# Patient Record
Sex: Male | Born: 1945
Health system: Southern US, Community
[De-identification: ages and names within clinical notes are randomized; demographics above are authoritative.]

## PROBLEM LIST (undated history)

## (undated) DIAGNOSIS — I6529 Occlusion and stenosis of unspecified carotid artery: Secondary | ICD-10-CM

## (undated) DIAGNOSIS — K922 Gastrointestinal hemorrhage, unspecified: Secondary | ICD-10-CM

## (undated) DIAGNOSIS — M1990 Unspecified osteoarthritis, unspecified site: Secondary | ICD-10-CM

## (undated) DIAGNOSIS — D649 Anemia, unspecified: Secondary | ICD-10-CM

## (undated) DIAGNOSIS — G4730 Sleep apnea, unspecified: Secondary | ICD-10-CM

## (undated) DIAGNOSIS — Z720 Tobacco use: Secondary | ICD-10-CM

## (undated) DIAGNOSIS — N189 Chronic kidney disease, unspecified: Secondary | ICD-10-CM

## (undated) DIAGNOSIS — R161 Splenomegaly, not elsewhere classified: Secondary | ICD-10-CM

## (undated) DIAGNOSIS — I10 Essential (primary) hypertension: Secondary | ICD-10-CM

## (undated) DIAGNOSIS — I639 Cerebral infarction, unspecified: Secondary | ICD-10-CM

## (undated) DIAGNOSIS — I340 Nonrheumatic mitral (valve) insufficiency: Secondary | ICD-10-CM

## (undated) DIAGNOSIS — I480 Paroxysmal atrial fibrillation: Secondary | ICD-10-CM

## (undated) DIAGNOSIS — Z95810 Presence of automatic (implantable) cardiac defibrillator: Secondary | ICD-10-CM

## (undated) DIAGNOSIS — I219 Acute myocardial infarction, unspecified: Secondary | ICD-10-CM

## (undated) DIAGNOSIS — Z951 Presence of aortocoronary bypass graft: Secondary | ICD-10-CM

## (undated) DIAGNOSIS — I255 Ischemic cardiomyopathy: Secondary | ICD-10-CM

## (undated) DIAGNOSIS — I2510 Atherosclerotic heart disease of native coronary artery without angina pectoris: Secondary | ICD-10-CM

## (undated) DIAGNOSIS — R001 Bradycardia, unspecified: Secondary | ICD-10-CM

## (undated) DIAGNOSIS — I5022 Chronic systolic (congestive) heart failure: Secondary | ICD-10-CM

## (undated) DIAGNOSIS — G709 Myoneural disorder, unspecified: Secondary | ICD-10-CM

## (undated) HISTORY — DX: Anemia, unspecified: D64.9

## (undated) HISTORY — DX: Presence of aortocoronary bypass graft: Z95.1

## (undated) HISTORY — PX: CARDIAC DEFIBRILLATOR PLACEMENT: SHX171

## (undated) HISTORY — PX: CORONARY ARTERY BYPASS GRAFT: SHX141

## (undated) HISTORY — DX: Chronic kidney disease, unspecified: N18.9

## (undated) HISTORY — DX: Nonrheumatic mitral (valve) insufficiency: I34.0

## (undated) HISTORY — DX: Bradycardia, unspecified: R00.1

## (undated) HISTORY — DX: Chronic systolic (congestive) heart failure: I50.22

## (undated) HISTORY — DX: Ischemic cardiomyopathy: I25.5

## (undated) HISTORY — DX: Unspecified osteoarthritis, unspecified site: M19.90

## (undated) HISTORY — DX: Atherosclerotic heart disease of native coronary artery without angina pectoris: I25.10

## (undated) HISTORY — DX: Cerebral infarction, unspecified: I63.9

## (undated) HISTORY — DX: Essential (primary) hypertension: I10

## (undated) HISTORY — DX: Tobacco use: Z72.0

## (undated) HISTORY — DX: Occlusion and stenosis of unspecified carotid artery: I65.29

## (undated) HISTORY — PX: TONSILLECTOMY: SUR1361

---

## 1989-05-30 HISTORY — PX: JOINT REPLACEMENT: SHX530

## 1998-11-28 ENCOUNTER — Encounter
Admission: RE | Admit: 1998-11-28 | Discharge: 1998-12-28 | Payer: Self-pay | Admitting: Physical Medicine and Rehabilitation

## 1999-01-14 ENCOUNTER — Encounter
Admission: RE | Admit: 1999-01-14 | Discharge: 1999-04-14 | Payer: Self-pay | Admitting: Physical Medicine and Rehabilitation

## 2003-10-13 ENCOUNTER — Ambulatory Visit (HOSPITAL_COMMUNITY): Admission: RE | Admit: 2003-10-13 | Discharge: 2003-10-13 | Payer: Self-pay | Admitting: Neurology

## 2005-01-08 ENCOUNTER — Ambulatory Visit: Payer: Self-pay | Admitting: Cardiology

## 2005-01-08 ENCOUNTER — Encounter: Payer: Self-pay | Admitting: Emergency Medicine

## 2005-01-08 ENCOUNTER — Inpatient Hospital Stay (HOSPITAL_COMMUNITY): Admission: EM | Admit: 2005-01-08 | Discharge: 2005-01-14 | Payer: Self-pay | Admitting: Cardiology

## 2005-01-10 HISTORY — PX: OTHER SURGICAL HISTORY: SHX169

## 2005-01-29 ENCOUNTER — Ambulatory Visit: Payer: Self-pay | Admitting: *Deleted

## 2005-04-06 ENCOUNTER — Ambulatory Visit: Payer: Self-pay | Admitting: Cardiology

## 2005-04-06 ENCOUNTER — Inpatient Hospital Stay (HOSPITAL_COMMUNITY): Admission: EM | Admit: 2005-04-06 | Discharge: 2005-04-08 | Payer: Self-pay | Admitting: Emergency Medicine

## 2005-04-30 ENCOUNTER — Ambulatory Visit: Payer: Self-pay | Admitting: Cardiology

## 2005-08-29 ENCOUNTER — Ambulatory Visit: Payer: Self-pay | Admitting: Cardiology

## 2005-09-25 ENCOUNTER — Ambulatory Visit: Payer: Self-pay

## 2005-10-13 ENCOUNTER — Ambulatory Visit: Payer: Self-pay | Admitting: Cardiology

## 2006-02-02 ENCOUNTER — Ambulatory Visit: Payer: Self-pay | Admitting: Cardiology

## 2006-08-17 ENCOUNTER — Ambulatory Visit: Payer: Self-pay

## 2006-09-11 ENCOUNTER — Ambulatory Visit: Payer: Self-pay | Admitting: Cardiology

## 2007-07-31 HISTORY — PX: ESOPHAGOGASTRODUODENOSCOPY: SHX1529

## 2007-07-31 HISTORY — PX: COLONOSCOPY: SHX174

## 2007-08-11 ENCOUNTER — Inpatient Hospital Stay (HOSPITAL_COMMUNITY): Admission: EM | Admit: 2007-08-11 | Discharge: 2007-08-14 | Payer: Self-pay | Admitting: Emergency Medicine

## 2007-08-12 ENCOUNTER — Ambulatory Visit: Payer: Self-pay | Admitting: Internal Medicine

## 2007-08-13 ENCOUNTER — Ambulatory Visit: Payer: Self-pay | Admitting: Internal Medicine

## 2007-08-13 ENCOUNTER — Encounter: Payer: Self-pay | Admitting: Internal Medicine

## 2007-08-14 ENCOUNTER — Ambulatory Visit: Payer: Self-pay | Admitting: Pulmonary Disease

## 2007-08-14 ENCOUNTER — Ambulatory Visit: Payer: Self-pay | Admitting: Oncology

## 2007-08-14 ENCOUNTER — Ambulatory Visit: Payer: Self-pay | Admitting: Cardiology

## 2007-08-14 ENCOUNTER — Ambulatory Visit: Payer: Self-pay | Admitting: *Deleted

## 2007-08-14 ENCOUNTER — Ambulatory Visit: Payer: Self-pay | Admitting: Thoracic Surgery (Cardiothoracic Vascular Surgery)

## 2007-08-14 ENCOUNTER — Inpatient Hospital Stay (HOSPITAL_COMMUNITY): Admission: EM | Admit: 2007-08-14 | Discharge: 2007-09-04 | Payer: Self-pay | Admitting: Emergency Medicine

## 2007-08-15 ENCOUNTER — Encounter: Payer: Self-pay | Admitting: Cardiology

## 2007-08-19 ENCOUNTER — Ambulatory Visit: Payer: Self-pay | Admitting: Gastroenterology

## 2007-08-20 ENCOUNTER — Encounter: Payer: Self-pay | Admitting: Thoracic Surgery (Cardiothoracic Vascular Surgery)

## 2007-08-20 ENCOUNTER — Ambulatory Visit: Payer: Self-pay | Admitting: *Deleted

## 2007-08-20 ENCOUNTER — Encounter: Payer: Self-pay | Admitting: Cardiology

## 2007-08-24 ENCOUNTER — Encounter: Payer: Self-pay | Admitting: Cardiology

## 2007-08-27 ENCOUNTER — Ambulatory Visit: Payer: Self-pay | Admitting: Oncology

## 2007-08-29 ENCOUNTER — Encounter: Payer: Self-pay | Admitting: Thoracic Surgery (Cardiothoracic Vascular Surgery)

## 2007-08-29 HISTORY — PX: CORONARY ARTERY BYPASS GRAFT: SHX141

## 2007-09-15 ENCOUNTER — Ambulatory Visit: Payer: Self-pay | Admitting: Cardiology

## 2007-09-15 LAB — CONVERTED CEMR LAB
ALT: 73 units/L — ABNORMAL HIGH (ref 0–53)
AST: 28 units/L (ref 0–37)
Bilirubin, Direct: 0.5 mg/dL — ABNORMAL HIGH (ref 0.0–0.3)
CO2: 31 meq/L (ref 19–32)
Calcium: 9.2 mg/dL (ref 8.4–10.5)
Chloride: 103 meq/L (ref 96–112)
GFR calc non Af Amer: 73 mL/min
Glucose, Bld: 142 mg/dL — ABNORMAL HIGH (ref 70–99)

## 2007-09-21 ENCOUNTER — Ambulatory Visit: Payer: Self-pay | Admitting: Cardiology

## 2007-09-21 LAB — CONVERTED CEMR LAB
Alkaline Phosphatase: 298 units/L — ABNORMAL HIGH (ref 39–117)
Total Bilirubin: 1.2 mg/dL (ref 0.3–1.2)
Total Protein: 6.7 g/dL (ref 6.0–8.3)

## 2007-09-27 ENCOUNTER — Encounter
Admission: RE | Admit: 2007-09-27 | Discharge: 2007-09-27 | Payer: Self-pay | Admitting: Thoracic Surgery (Cardiothoracic Vascular Surgery)

## 2007-09-27 ENCOUNTER — Ambulatory Visit: Payer: Self-pay | Admitting: Thoracic Surgery (Cardiothoracic Vascular Surgery)

## 2007-10-01 LAB — MORPHOLOGY: PLT EST: ADEQUATE

## 2007-10-01 LAB — CBC & DIFF AND RETIC
BASO%: 0.9 % (ref 0.0–2.0)
Eosinophils Absolute: 0.2 10*3/uL (ref 0.0–0.5)
HCT: 39 % (ref 38.7–49.9)
IRF: 0.33 (ref 0.070–0.380)
MCHC: 33.1 g/dL (ref 32.0–35.9)
MONO#: 0.5 10*3/uL (ref 0.1–0.9)
NEUT#: 5.1 10*3/uL (ref 1.5–6.5)
NEUT%: 64 % (ref 40.0–75.0)
RBC: 4.75 10*6/uL (ref 4.20–5.71)
Retic %: 1.5 % (ref 0.7–2.3)
WBC: 7.9 10*3/uL (ref 4.0–10.0)
lymph#: 2.1 10*3/uL (ref 0.9–3.3)

## 2007-10-01 LAB — CHCC SMEAR

## 2007-10-05 LAB — VITAMIN B12: Vitamin B-12: 581 pg/mL (ref 211–911)

## 2007-10-05 LAB — COMPREHENSIVE METABOLIC PANEL
ALT: 8 U/L (ref 0–53)
Alkaline Phosphatase: 197 U/L — ABNORMAL HIGH (ref 39–117)
Potassium: 4.5 mEq/L (ref 3.5–5.3)
Sodium: 138 mEq/L (ref 135–145)
Total Bilirubin: 0.9 mg/dL (ref 0.3–1.2)
Total Protein: 7.8 g/dL (ref 6.0–8.3)

## 2007-10-05 LAB — IMMUNOFIXATION ELECTROPHORESIS
IgG (Immunoglobin G), Serum: 871 mg/dL (ref 694–1618)
Total Protein, Serum Electrophoresis: 7.8 g/dL (ref 6.0–8.3)

## 2007-10-05 LAB — IRON AND TIBC: %SAT: 16 % — ABNORMAL LOW (ref 20–55)

## 2007-10-05 LAB — FERRITIN: Ferritin: 129 ng/mL (ref 22–322)

## 2007-10-12 ENCOUNTER — Encounter (HOSPITAL_COMMUNITY): Admission: RE | Admit: 2007-10-12 | Discharge: 2007-11-11 | Payer: Self-pay | Admitting: Cardiology

## 2007-10-26 ENCOUNTER — Ambulatory Visit: Payer: Self-pay | Admitting: Cardiology

## 2007-10-26 LAB — CONVERTED CEMR LAB
ALT: 10 units/L (ref 0–53)
AST: 17 units/L (ref 0–37)
Albumin: 4.1 g/dL (ref 3.5–5.2)
Alkaline Phosphatase: 69 units/L (ref 39–117)
BUN: 25 mg/dL — ABNORMAL HIGH (ref 6–23)
Calcium: 9.3 mg/dL (ref 8.4–10.5)
Chloride: 102 meq/L (ref 96–112)
Creatinine, Ser: 1.3 mg/dL (ref 0.4–1.5)
HDL: 35.5 mg/dL — ABNORMAL LOW (ref >39.0)
Total Bilirubin: 0.7 mg/dL (ref 0.3–1.2)
VLDL: 13 mg/dL (ref 0–40)

## 2007-10-27 ENCOUNTER — Ambulatory Visit: Payer: Self-pay | Admitting: Oncology

## 2007-10-29 LAB — CBC WITH DIFFERENTIAL/PLATELET
Basophils Absolute: 0 10*3/uL (ref 0.0–0.1)
EOS%: 2.8 % (ref 0.0–7.0)
HCT: 36.8 % — ABNORMAL LOW (ref 38.7–49.9)
HGB: 12.4 g/dL — ABNORMAL LOW (ref 13.0–17.1)
LYMPH%: 24.9 % (ref 14.0–48.0)
MCH: 28.2 pg (ref 28.0–33.4)
MCV: 83.9 fL (ref 81.6–98.0)
NEUT%: 64.6 % (ref 40.0–75.0)
Platelets: 231 10*3/uL (ref 145–400)
lymph#: 1.8 10*3/uL (ref 0.9–3.3)

## 2007-11-01 LAB — COMPREHENSIVE METABOLIC PANEL
AST: 11 U/L (ref 0–37)
BUN: 31 mg/dL — ABNORMAL HIGH (ref 6–23)
Calcium: 9.5 mg/dL (ref 8.4–10.5)
Chloride: 103 mEq/L (ref 96–112)
Creatinine, Ser: 1.24 mg/dL (ref 0.40–1.50)

## 2007-11-01 LAB — VITAMIN B12: Vitamin B-12: 366 pg/mL (ref 211–911)

## 2007-11-01 LAB — IRON AND TIBC: UIBC: 269 ug/dL

## 2007-11-12 ENCOUNTER — Encounter (HOSPITAL_COMMUNITY): Admission: RE | Admit: 2007-11-12 | Discharge: 2007-12-12 | Payer: Self-pay | Admitting: Cardiology

## 2007-11-26 LAB — CBC WITH DIFFERENTIAL/PLATELET
Basophils Absolute: 0 10*3/uL (ref 0.0–0.1)
EOS%: 4.4 % (ref 0.0–7.0)
HCT: 32.5 % — ABNORMAL LOW (ref 38.7–49.9)
HGB: 11 g/dL — ABNORMAL LOW (ref 13.0–17.1)
MCH: 29 pg (ref 28.0–33.4)
MCV: 85.3 fL (ref 81.6–98.0)
MONO%: 8.8 % (ref 0.0–13.0)
NEUT%: 68.6 % (ref 40.0–75.0)

## 2007-11-30 LAB — IRON AND TIBC
%SAT: 14 % — ABNORMAL LOW (ref 20–55)
TIBC: 302 ug/dL (ref 215–435)
UIBC: 261 ug/dL

## 2007-11-30 LAB — COMPREHENSIVE METABOLIC PANEL
AST: 13 U/L (ref 0–37)
Alkaline Phosphatase: 64 U/L (ref 39–117)
BUN: 37 mg/dL — ABNORMAL HIGH (ref 6–23)
Calcium: 9.1 mg/dL (ref 8.4–10.5)
Chloride: 107 mEq/L (ref 96–112)
Creatinine, Ser: 1.52 mg/dL — ABNORMAL HIGH (ref 0.40–1.50)

## 2007-11-30 LAB — TRANSFERRIN RECEPTOR, SOLUABLE: Transferrin Receptor, Soluble: 18 nmol/L

## 2007-11-30 LAB — FERRITIN: Ferritin: 52 ng/mL (ref 22–322)

## 2007-11-30 LAB — VITAMIN B12: Vitamin B-12: 339 pg/mL (ref 211–911)

## 2007-12-03 ENCOUNTER — Ambulatory Visit: Payer: Self-pay

## 2007-12-03 ENCOUNTER — Ambulatory Visit: Payer: Self-pay | Admitting: Cardiology

## 2007-12-03 ENCOUNTER — Encounter: Payer: Self-pay | Admitting: Cardiology

## 2007-12-03 LAB — CONVERTED CEMR LAB
CO2: 29 meq/L (ref 19–32)
GFR calc Af Amer: 53 mL/min
Glucose, Bld: 115 mg/dL — ABNORMAL HIGH (ref 70–99)
Potassium: 5.9 meq/L — ABNORMAL HIGH (ref 3.5–5.1)

## 2007-12-09 ENCOUNTER — Ambulatory Visit: Payer: Self-pay | Admitting: Cardiology

## 2007-12-09 LAB — CONVERTED CEMR LAB
CO2: 28 meq/L (ref 19–32)
GFR calc Af Amer: 53 mL/min
GFR calc non Af Amer: 44 mL/min
Glucose, Bld: 118 mg/dL — ABNORMAL HIGH (ref 70–99)
Sodium: 143 meq/L (ref 135–145)

## 2007-12-13 ENCOUNTER — Encounter (HOSPITAL_COMMUNITY): Admission: RE | Admit: 2007-12-13 | Discharge: 2008-01-12 | Payer: Self-pay | Admitting: Cardiology

## 2007-12-17 ENCOUNTER — Ambulatory Visit: Payer: Self-pay | Admitting: Cardiology

## 2007-12-17 LAB — CONVERTED CEMR LAB
Creatinine, Ser: 1.4 mg/dL (ref 0.4–1.5)
GFR calc non Af Amer: 55 mL/min
Glucose, Bld: 137 mg/dL — ABNORMAL HIGH (ref 70–99)
Potassium: 4.6 meq/L (ref 3.5–5.1)
Sodium: 140 meq/L (ref 135–145)

## 2007-12-22 ENCOUNTER — Ambulatory Visit: Payer: Self-pay | Admitting: Oncology

## 2008-01-14 ENCOUNTER — Encounter (HOSPITAL_COMMUNITY): Admission: RE | Admit: 2008-01-14 | Discharge: 2008-02-13 | Payer: Self-pay | Admitting: Cardiology

## 2008-01-21 LAB — CBC WITH DIFFERENTIAL/PLATELET
Basophils Absolute: 0 10*3/uL (ref 0.0–0.1)
Eosinophils Absolute: 0.2 10*3/uL (ref 0.0–0.5)
HGB: 12.3 g/dL — ABNORMAL LOW (ref 13.0–17.1)
LYMPH%: 14.3 % (ref 14.0–48.0)
MCV: 86.9 fL (ref 81.6–98.0)
MONO%: 4.9 % (ref 0.0–13.0)
NEUT#: 7.2 10*3/uL — ABNORMAL HIGH (ref 1.5–6.5)
Platelets: 223 10*3/uL (ref 145–400)

## 2008-01-24 LAB — COMPREHENSIVE METABOLIC PANEL
Albumin: 4.6 g/dL (ref 3.5–5.2)
Alkaline Phosphatase: 64 U/L (ref 39–117)
BUN: 22 mg/dL (ref 6–23)
Glucose, Bld: 133 mg/dL — ABNORMAL HIGH (ref 70–99)
Potassium: 4.4 mEq/L (ref 3.5–5.3)
Total Bilirubin: 0.5 mg/dL (ref 0.3–1.2)

## 2008-01-24 LAB — IRON AND TIBC
Iron: 47 ug/dL (ref 42–165)
UIBC: 307 ug/dL

## 2008-01-24 LAB — LACTATE DEHYDROGENASE: LDH: 147 U/L (ref 94–250)

## 2008-02-11 ENCOUNTER — Ambulatory Visit: Payer: Self-pay | Admitting: Oncology

## 2008-02-14 ENCOUNTER — Encounter (HOSPITAL_COMMUNITY): Admission: RE | Admit: 2008-02-14 | Discharge: 2008-03-15 | Payer: Self-pay | Admitting: Cardiology

## 2008-02-18 ENCOUNTER — Ambulatory Visit: Payer: Self-pay | Admitting: Cardiology

## 2008-03-17 LAB — CBC WITH DIFFERENTIAL/PLATELET
BASO%: 0.1 % (ref 0.0–2.0)
Basophils Absolute: 0 10*3/uL (ref 0.0–0.1)
EOS%: 1.6 % (ref 0.0–7.0)
HCT: 38.9 % (ref 38.7–49.9)
MCH: 29.9 pg (ref 28.0–33.4)
MCHC: 33.5 g/dL (ref 32.0–35.9)
MCV: 89.4 fL (ref 81.6–98.0)
MONO%: 5.5 % (ref 0.0–13.0)
NEUT%: 74.7 % (ref 40.0–75.0)
lymph#: 1.3 10*3/uL (ref 0.9–3.3)

## 2008-03-20 LAB — IRON AND TIBC
%SAT: 38 % (ref 20–55)
TIBC: 283 ug/dL (ref 215–435)
UIBC: 176 ug/dL

## 2008-03-20 LAB — COMPREHENSIVE METABOLIC PANEL
ALT: <8 U/L (ref 0–53)
AST: 12 U/L (ref 0–37)
Alkaline Phosphatase: 62 U/L (ref 39–117)
BUN: 27 mg/dL — ABNORMAL HIGH (ref 6–23)
Chloride: 105 mEq/L (ref 96–112)
Creatinine, Ser: 1.26 mg/dL (ref 0.40–1.50)
Total Bilirubin: 0.5 mg/dL (ref 0.3–1.2)

## 2008-03-20 LAB — FERRITIN: Ferritin: 441 ng/mL — ABNORMAL HIGH (ref 22–322)

## 2008-03-28 ENCOUNTER — Ambulatory Visit: Payer: Self-pay | Admitting: Internal Medicine

## 2008-03-28 LAB — CONVERTED CEMR LAB
Basophils Absolute: 0 10*3/uL (ref 0.0–0.1)
CO2: 30 meq/L (ref 19–32)
Calcium: 9.1 mg/dL (ref 8.4–10.5)
Creatinine, Ser: 1.3 mg/dL (ref 0.4–1.5)
Eosinophils Absolute: 0.1 10*3/uL (ref 0.0–0.7)
GFR calc Af Amer: 72 mL/min
GFR calc non Af Amer: 60 mL/min
Hemoglobin: 12.9 g/dL — ABNORMAL LOW (ref 13.0–17.0)
Lymphocytes Relative: 20.1 % (ref 12.0–46.0)
MCHC: 33.4 g/dL (ref 30.0–36.0)
MCV: 91.4 fL (ref 78.0–100.0)
Neutro Abs: 5 10*3/uL (ref 1.4–7.7)
Prothrombin Time: 12.7 s (ref 10.9–13.3)
RDW: 15.3 % — ABNORMAL HIGH (ref 11.5–14.6)
Sodium: 141 meq/L (ref 135–145)
aPTT: 30.7 s — ABNORMAL HIGH (ref 21.7–29.8)

## 2008-04-04 ENCOUNTER — Observation Stay (HOSPITAL_COMMUNITY): Admission: EM | Admit: 2008-04-04 | Discharge: 2008-04-06 | Payer: Self-pay | Admitting: Emergency Medicine

## 2008-04-04 ENCOUNTER — Ambulatory Visit: Payer: Self-pay | Admitting: Cardiovascular Disease

## 2008-04-05 ENCOUNTER — Encounter: Payer: Self-pay | Admitting: Cardiology

## 2008-04-05 HISTORY — PX: CARDIAC DEFIBRILLATOR PLACEMENT: SHX171

## 2008-04-11 ENCOUNTER — Ambulatory Visit: Payer: Self-pay | Admitting: Oncology

## 2008-04-19 ENCOUNTER — Ambulatory Visit: Payer: Self-pay

## 2008-06-06 ENCOUNTER — Ambulatory Visit: Payer: Self-pay | Admitting: Oncology

## 2008-06-09 LAB — CBC WITH DIFFERENTIAL/PLATELET
BASO%: 0.3 % (ref 0.0–2.0)
EOS%: 3 % (ref 0.0–7.0)
MCH: 31.5 pg (ref 28.0–33.4)
MCHC: 34.2 g/dL (ref 32.0–35.9)
NEUT%: 61.4 % (ref 40.0–75.0)
RBC: 4.16 10*6/uL — ABNORMAL LOW (ref 4.20–5.71)
RDW: 16.8 % — ABNORMAL HIGH (ref 11.2–14.6)
lymph#: 1.5 10*3/uL (ref 0.9–3.3)

## 2008-06-12 LAB — COMPREHENSIVE METABOLIC PANEL
ALT: 9 U/L (ref 0–53)
AST: 14 U/L (ref 0–37)
BUN: 23 mg/dL (ref 6–23)
Creatinine, Ser: 0.95 mg/dL (ref 0.40–1.50)
Total Bilirubin: 0.5 mg/dL (ref 0.3–1.2)

## 2008-06-12 LAB — IRON AND TIBC
Iron: 77 ug/dL (ref 42–165)
TIBC: 291 ug/dL (ref 215–435)
UIBC: 214 ug/dL

## 2008-06-12 LAB — LACTATE DEHYDROGENASE: LDH: 158 U/L (ref 94–250)

## 2008-06-12 LAB — TRANSFERRIN RECEPTOR, SOLUABLE: Transferrin Receptor, Soluble: 20 nmol/L

## 2008-06-12 LAB — VITAMIN B12: Vitamin B-12: 363 pg/mL (ref 211–911)

## 2008-07-11 ENCOUNTER — Ambulatory Visit: Payer: Self-pay | Admitting: Internal Medicine

## 2008-08-02 ENCOUNTER — Ambulatory Visit: Payer: Self-pay | Admitting: Oncology

## 2008-08-09 ENCOUNTER — Ambulatory Visit: Payer: Self-pay | Admitting: Cardiology

## 2008-08-10 ENCOUNTER — Ambulatory Visit: Payer: Self-pay

## 2008-09-01 LAB — CBC WITH DIFFERENTIAL/PLATELET
BASO%: 0.3 % (ref 0.0–2.0)
EOS%: 2.4 % (ref 0.0–7.0)
HCT: 41.3 % (ref 38.7–49.9)
LYMPH%: 19.5 % (ref 14.0–48.0)
MCH: 31.9 pg (ref 28.0–33.4)
MCHC: 34 g/dL (ref 32.0–35.9)
MCV: 94.1 fL (ref 81.6–98.0)
MONO#: 0.5 10*3/uL (ref 0.1–0.9)
MONO%: 7.1 % (ref 0.0–13.0)
NEUT%: 70.7 % (ref 40.0–75.0)
Platelets: 181 10*3/uL (ref 145–400)
RBC: 4.39 10*6/uL (ref 4.20–5.71)
WBC: 7.2 10*3/uL (ref 4.0–10.0)

## 2008-09-04 LAB — COMPREHENSIVE METABOLIC PANEL
ALT: 10 U/L (ref 0–53)
AST: 13 U/L (ref 0–37)
Alkaline Phosphatase: 65 U/L (ref 39–117)
CO2: 27 mEq/L (ref 19–32)
Creatinine, Ser: 1.24 mg/dL (ref 0.40–1.50)
Sodium: 140 mEq/L (ref 135–145)
Total Bilirubin: 0.5 mg/dL (ref 0.3–1.2)
Total Protein: 6.9 g/dL (ref 6.0–8.3)

## 2008-09-04 LAB — IRON AND TIBC
%SAT: 17 % — ABNORMAL LOW (ref 20–55)
TIBC: 301 ug/dL (ref 215–435)
UIBC: 249 ug/dL

## 2008-09-04 LAB — TRANSFERRIN RECEPTOR, SOLUABLE: Transferrin Receptor, Soluble: 21.9 nmol/L

## 2008-09-04 LAB — VITAMIN B12: Vitamin B-12: 353 pg/mL (ref 211–911)

## 2008-09-26 ENCOUNTER — Ambulatory Visit: Payer: Self-pay | Admitting: Oncology

## 2008-11-22 ENCOUNTER — Ambulatory Visit: Payer: Self-pay | Admitting: Oncology

## 2008-12-05 ENCOUNTER — Encounter: Payer: Self-pay | Admitting: Internal Medicine

## 2009-01-04 ENCOUNTER — Ambulatory Visit: Payer: Self-pay | Admitting: Cardiology

## 2009-01-17 ENCOUNTER — Ambulatory Visit: Payer: Self-pay | Admitting: Oncology

## 2009-01-18 ENCOUNTER — Telehealth: Payer: Self-pay | Admitting: Cardiology

## 2009-01-29 ENCOUNTER — Encounter: Payer: Self-pay | Admitting: Cardiology

## 2009-02-19 ENCOUNTER — Encounter: Payer: Self-pay | Admitting: Internal Medicine

## 2009-03-09 ENCOUNTER — Telehealth: Payer: Self-pay | Admitting: Internal Medicine

## 2009-03-12 DIAGNOSIS — D509 Iron deficiency anemia, unspecified: Secondary | ICD-10-CM

## 2009-03-12 DIAGNOSIS — N189 Chronic kidney disease, unspecified: Secondary | ICD-10-CM | POA: Insufficient documentation

## 2009-03-12 DIAGNOSIS — M1990 Unspecified osteoarthritis, unspecified site: Secondary | ICD-10-CM | POA: Insufficient documentation

## 2009-03-12 DIAGNOSIS — F17200 Nicotine dependence, unspecified, uncomplicated: Secondary | ICD-10-CM

## 2009-03-12 DIAGNOSIS — I10 Essential (primary) hypertension: Secondary | ICD-10-CM

## 2009-03-12 DIAGNOSIS — I428 Other cardiomyopathies: Secondary | ICD-10-CM | POA: Insufficient documentation

## 2009-03-12 DIAGNOSIS — Z951 Presence of aortocoronary bypass graft: Secondary | ICD-10-CM | POA: Insufficient documentation

## 2009-03-12 DIAGNOSIS — E1351 Other specified diabetes mellitus with diabetic peripheral angiopathy without gangrene: Secondary | ICD-10-CM | POA: Insufficient documentation

## 2009-03-12 DIAGNOSIS — I739 Peripheral vascular disease, unspecified: Secondary | ICD-10-CM

## 2009-03-12 DIAGNOSIS — I509 Heart failure, unspecified: Secondary | ICD-10-CM | POA: Insufficient documentation

## 2009-03-12 DIAGNOSIS — I200 Unstable angina: Secondary | ICD-10-CM

## 2009-03-12 DIAGNOSIS — I495 Sick sinus syndrome: Secondary | ICD-10-CM

## 2009-03-12 DIAGNOSIS — M79609 Pain in unspecified limb: Secondary | ICD-10-CM

## 2009-03-12 DIAGNOSIS — I679 Cerebrovascular disease, unspecified: Secondary | ICD-10-CM

## 2009-03-12 DIAGNOSIS — I252 Old myocardial infarction: Secondary | ICD-10-CM

## 2009-03-12 DIAGNOSIS — I2589 Other forms of chronic ischemic heart disease: Secondary | ICD-10-CM

## 2009-03-13 ENCOUNTER — Encounter: Payer: Self-pay | Admitting: Internal Medicine

## 2009-03-13 ENCOUNTER — Ambulatory Visit: Payer: Self-pay | Admitting: Internal Medicine

## 2009-03-13 DIAGNOSIS — I4891 Unspecified atrial fibrillation: Secondary | ICD-10-CM

## 2009-03-13 DIAGNOSIS — Z95810 Presence of automatic (implantable) cardiac defibrillator: Secondary | ICD-10-CM

## 2009-03-13 DIAGNOSIS — I480 Paroxysmal atrial fibrillation: Secondary | ICD-10-CM | POA: Insufficient documentation

## 2009-03-14 ENCOUNTER — Ambulatory Visit: Payer: Self-pay | Admitting: Oncology

## 2009-03-15 ENCOUNTER — Telehealth: Payer: Self-pay | Admitting: Cardiology

## 2009-04-11 ENCOUNTER — Ambulatory Visit: Payer: Self-pay | Admitting: Oncology

## 2009-05-11 ENCOUNTER — Ambulatory Visit: Payer: Self-pay | Admitting: Oncology

## 2009-06-01 ENCOUNTER — Encounter: Payer: Self-pay | Admitting: Cardiology

## 2009-06-13 ENCOUNTER — Ambulatory Visit: Payer: Self-pay | Admitting: Oncology

## 2009-06-15 ENCOUNTER — Encounter: Payer: Self-pay | Admitting: Cardiology

## 2009-06-15 LAB — COMPREHENSIVE METABOLIC PANEL
Alkaline Phosphatase: 62 U/L (ref 39–117)
BUN: 30 mg/dL — ABNORMAL HIGH (ref 6–23)
Glucose, Bld: 142 mg/dL — ABNORMAL HIGH (ref 70–99)
Sodium: 141 mEq/L (ref 135–145)
Total Bilirubin: 0.4 mg/dL (ref 0.3–1.2)

## 2009-06-15 LAB — CBC WITH DIFFERENTIAL/PLATELET
Basophils Absolute: 0 10*3/uL (ref 0.0–0.1)
Eosinophils Absolute: 0.2 10*3/uL (ref 0.0–0.5)
HCT: 35.7 % — ABNORMAL LOW (ref 38.4–49.9)
HGB: 12.4 g/dL — ABNORMAL LOW (ref 13.0–17.1)
LYMPH%: 17.2 % (ref 14.0–49.0)
MCHC: 34.8 g/dL (ref 32.0–36.0)
MONO%: 7 % (ref 0.0–14.0)
RDW: 14.5 % (ref 11.0–14.6)
WBC: 6.4 10*3/uL (ref 4.0–10.3)

## 2009-06-15 LAB — IRON AND TIBC
Iron: 58 ug/dL (ref 42–165)
TIBC: 270 ug/dL (ref 215–435)
UIBC: 212 ug/dL

## 2009-07-11 ENCOUNTER — Ambulatory Visit: Payer: Self-pay | Admitting: Oncology

## 2009-08-03 ENCOUNTER — Telehealth (INDEPENDENT_AMBULATORY_CARE_PROVIDER_SITE_OTHER): Payer: Self-pay | Admitting: *Deleted

## 2009-08-03 ENCOUNTER — Encounter: Payer: Self-pay | Admitting: Cardiology

## 2009-08-06 ENCOUNTER — Encounter: Payer: Self-pay | Admitting: Internal Medicine

## 2009-08-08 ENCOUNTER — Ambulatory Visit: Payer: Self-pay | Admitting: Oncology

## 2009-08-13 ENCOUNTER — Telehealth (INDEPENDENT_AMBULATORY_CARE_PROVIDER_SITE_OTHER): Payer: Self-pay | Admitting: *Deleted

## 2009-08-16 ENCOUNTER — Telehealth: Payer: Self-pay | Admitting: Cardiology

## 2009-08-20 ENCOUNTER — Encounter: Payer: Self-pay | Admitting: Cardiology

## 2009-08-21 ENCOUNTER — Encounter: Payer: Self-pay | Admitting: Cardiology

## 2009-09-18 ENCOUNTER — Encounter: Payer: Self-pay | Admitting: Internal Medicine

## 2009-09-18 ENCOUNTER — Ambulatory Visit: Payer: Self-pay | Admitting: Cardiology

## 2009-10-03 ENCOUNTER — Ambulatory Visit: Payer: Self-pay | Admitting: Oncology

## 2009-11-07 ENCOUNTER — Telehealth: Payer: Self-pay | Admitting: Cardiology

## 2009-11-26 ENCOUNTER — Ambulatory Visit: Payer: Self-pay | Admitting: Oncology

## 2009-12-03 ENCOUNTER — Encounter: Payer: Self-pay | Admitting: Cardiology

## 2009-12-03 LAB — CBC WITH DIFFERENTIAL/PLATELET
BASO%: 0.4 % (ref 0.0–2.0)
EOS%: 3.1 % (ref 0.0–7.0)
HCT: 39.3 % (ref 38.4–49.9)
HGB: 13.4 g/dL (ref 13.0–17.1)
MCH: 31.8 pg (ref 27.2–33.4)
MCHC: 34 g/dL (ref 32.0–36.0)
MONO#: 0.5 10*3/uL (ref 0.1–0.9)
RDW: 14.6 % (ref 11.0–14.6)
WBC: 6.2 10*3/uL (ref 4.0–10.3)
lymph#: 1.4 10*3/uL (ref 0.9–3.3)

## 2009-12-04 LAB — COMPREHENSIVE METABOLIC PANEL
ALT: 9 U/L (ref 0–53)
AST: 14 U/L (ref 0–37)
Albumin: 4.6 g/dL (ref 3.5–5.2)
CO2: 25 mEq/L (ref 19–32)
Calcium: 9 mg/dL (ref 8.4–10.5)
Chloride: 103 mEq/L (ref 96–112)
Potassium: 4.8 mEq/L (ref 3.5–5.3)
Total Protein: 6.7 g/dL (ref 6.0–8.3)

## 2009-12-04 LAB — LACTATE DEHYDROGENASE: LDH: 159 U/L (ref 94–250)

## 2009-12-04 LAB — TRANSFERRIN RECEPTOR, SOLUABLE: Transferrin Receptor, Soluble: 21.9 nmol/L

## 2009-12-04 LAB — VITAMIN B12: Vitamin B-12: 396 pg/mL (ref 211–911)

## 2009-12-06 ENCOUNTER — Telehealth (INDEPENDENT_AMBULATORY_CARE_PROVIDER_SITE_OTHER): Payer: Self-pay | Admitting: *Deleted

## 2009-12-12 ENCOUNTER — Encounter: Payer: Self-pay | Admitting: Internal Medicine

## 2009-12-12 ENCOUNTER — Telehealth (INDEPENDENT_AMBULATORY_CARE_PROVIDER_SITE_OTHER): Payer: Self-pay | Admitting: *Deleted

## 2009-12-12 ENCOUNTER — Ambulatory Visit: Payer: Self-pay

## 2009-12-28 ENCOUNTER — Ambulatory Visit: Payer: Self-pay | Admitting: Oncology

## 2009-12-31 ENCOUNTER — Telehealth: Payer: Self-pay | Admitting: Cardiology

## 2010-01-01 ENCOUNTER — Encounter: Payer: Self-pay | Admitting: Internal Medicine

## 2010-02-18 ENCOUNTER — Telehealth: Payer: Self-pay | Admitting: Cardiology

## 2010-02-19 ENCOUNTER — Telehealth: Payer: Self-pay | Admitting: Cardiology

## 2010-02-21 ENCOUNTER — Ambulatory Visit: Payer: Self-pay | Admitting: Oncology

## 2010-02-26 ENCOUNTER — Ambulatory Visit: Payer: Self-pay | Admitting: Internal Medicine

## 2010-02-26 ENCOUNTER — Inpatient Hospital Stay (HOSPITAL_COMMUNITY): Admission: EM | Admit: 2010-02-26 | Discharge: 2010-02-28 | Payer: Self-pay | Admitting: Emergency Medicine

## 2010-02-26 ENCOUNTER — Telehealth: Payer: Self-pay | Admitting: Cardiology

## 2010-02-27 ENCOUNTER — Encounter: Payer: Self-pay | Admitting: Internal Medicine

## 2010-02-28 ENCOUNTER — Telehealth (INDEPENDENT_AMBULATORY_CARE_PROVIDER_SITE_OTHER): Payer: Self-pay | Admitting: *Deleted

## 2010-03-01 ENCOUNTER — Telehealth: Payer: Self-pay | Admitting: Cardiology

## 2010-03-08 ENCOUNTER — Ambulatory Visit: Payer: Self-pay | Admitting: Internal Medicine

## 2010-03-11 ENCOUNTER — Ambulatory Visit: Payer: Self-pay | Admitting: Cardiovascular Disease

## 2010-03-11 ENCOUNTER — Encounter: Payer: Self-pay | Admitting: Cardiology

## 2010-03-11 ENCOUNTER — Encounter: Payer: Self-pay | Admitting: Physician Assistant

## 2010-04-17 ENCOUNTER — Ambulatory Visit: Payer: Self-pay | Admitting: Oncology

## 2010-05-15 ENCOUNTER — Encounter: Payer: Self-pay | Admitting: Cardiology

## 2010-05-17 ENCOUNTER — Ambulatory Visit: Payer: Self-pay | Admitting: Oncology

## 2010-05-20 ENCOUNTER — Telehealth: Payer: Self-pay | Admitting: Cardiology

## 2010-05-20 ENCOUNTER — Encounter: Payer: Self-pay | Admitting: Cardiology

## 2010-05-29 ENCOUNTER — Telehealth (INDEPENDENT_AMBULATORY_CARE_PROVIDER_SITE_OTHER): Payer: Self-pay | Admitting: *Deleted

## 2010-06-17 ENCOUNTER — Ambulatory Visit: Payer: Self-pay

## 2010-06-17 ENCOUNTER — Telehealth: Payer: Self-pay | Admitting: Cardiology

## 2010-06-17 ENCOUNTER — Encounter: Payer: Self-pay | Admitting: Internal Medicine

## 2010-06-17 ENCOUNTER — Ambulatory Visit: Payer: Self-pay | Admitting: Oncology

## 2010-06-18 ENCOUNTER — Encounter: Payer: Self-pay | Admitting: Cardiology

## 2010-06-18 LAB — CBC WITH DIFFERENTIAL/PLATELET
BASO%: 0.4 % (ref 0.0–2.0)
EOS%: 2 % (ref 0.0–7.0)
MCH: 31.5 pg (ref 27.2–33.4)
MCHC: 33.3 g/dL (ref 32.0–36.0)
MONO#: 0.4 10*3/uL (ref 0.1–0.9)
RBC: 4.21 10*6/uL (ref 4.20–5.82)
RDW: 15.4 % — ABNORMAL HIGH (ref 11.0–14.6)
WBC: 5.5 10*3/uL (ref 4.0–10.3)
lymph#: 1 10*3/uL (ref 0.9–3.3)

## 2010-06-19 LAB — COMPREHENSIVE METABOLIC PANEL
ALT: <8 U/L (ref 0–53)
AST: 13 U/L (ref 0–37)
CO2: 25 mEq/L (ref 19–32)
Calcium: 9 mg/dL (ref 8.4–10.5)
Chloride: 106 mEq/L (ref 96–112)
Creatinine, Ser: 1.39 mg/dL (ref 0.40–1.50)
Sodium: 141 mEq/L (ref 135–145)
Total Protein: 6.7 g/dL (ref 6.0–8.3)

## 2010-06-19 LAB — LACTATE DEHYDROGENASE: LDH: 150 U/L (ref 94–250)

## 2010-06-19 LAB — IRON AND TIBC
%SAT: 30 % (ref 20–55)
Iron: 90 ug/dL (ref 42–165)
UIBC: 212 ug/dL

## 2010-06-19 LAB — TRANSFERRIN RECEPTOR, SOLUABLE: Transferrin Receptor, Soluble: 25 nmol/L

## 2010-07-02 ENCOUNTER — Encounter: Payer: Self-pay | Admitting: Internal Medicine

## 2010-07-02 ENCOUNTER — Telehealth: Payer: Self-pay | Admitting: Cardiology

## 2010-07-03 ENCOUNTER — Telehealth: Payer: Self-pay | Admitting: Cardiology

## 2010-07-17 ENCOUNTER — Ambulatory Visit: Payer: Self-pay | Admitting: Oncology

## 2010-07-18 ENCOUNTER — Ambulatory Visit: Payer: Self-pay

## 2010-07-18 ENCOUNTER — Ambulatory Visit: Payer: Self-pay | Admitting: Cardiology

## 2010-08-16 ENCOUNTER — Ambulatory Visit: Payer: Self-pay | Admitting: Oncology

## 2010-09-12 ENCOUNTER — Ambulatory Visit: Payer: Self-pay

## 2010-09-12 ENCOUNTER — Telehealth: Payer: Self-pay | Admitting: Cardiology

## 2010-09-12 ENCOUNTER — Encounter: Payer: Self-pay | Admitting: Internal Medicine

## 2010-10-09 ENCOUNTER — Ambulatory Visit: Payer: Self-pay | Admitting: Oncology

## 2010-10-31 NOTE — Letter (Signed)
Summary[facility]   Imported By: Rexene Alberts 07/02/2010 13:54:59  _____________________________________________________________________  External Attachment:    Type:   Image     Comment:   External Document

## 2010-10-31 NOTE — Letter (Signed)
Summary: [facility]   Imported By: Marylou Mccoy 07/30/2010 14:02:16  _____________________________________________________________________  External Attachment:    Type:   Image     Comment:   External Document

## 2010-10-31 NOTE — Procedures (Signed)
Summary: device/saf   Current Medications (verified): 1)  Coreg 12.5 Mg Tabs (Carvedilol) .[name].. 1 Tablet By Mouth Twice A Day 2)  Lasix 40 Mg Tabs (Furosemide) .... Take 1 Tablet By Mouth Once A Day 3)  Lipitor 80 Mg Tabs (Atorvastatin Calcium) .... Take 1 Tablet By Mouth Once A Day 4)  Lisinopril 10 Mg Tabs (Lisinopril) .... Take 1 Tablet By Mouth Twice A Day 5)  Nitroglycerin 0.4 Mg Subl (Nitroglycerin) .... Place 1 Tablet Under Tongue As Directed 6)  Plavix 75 Mg Tabs (Clopidogrel Bisulfate) .[name].. 1 Once Daily 7)  Spironolactone 25 Mg Tabs (Spironolactone) .... Take 1 Tablet By Mouth Every Morning 8)  Ecotrin 325 Mg Tbec (Aspirin) .[name].. 1 Qd 9)  Glucophage Xr 500 Mg Xr24h-Tab (Metformin Hcl) .[name].. 1 Two Times A Day As Directed 10)  Amaryl 4 Mg Tabs (Glimepiride) .[name].. 1 Once Daily 11)  Colace 100 Mg Caps (Docusate Sodium) .... 2 Every Pm 12)  Baclofen 10 Mg Tabs (Baclofen) .... 4 Tabs Daily 13)  Percocet 5-325 Mg Tabs (Oxycodone-Acetaminophen) .... One Every 4 Hours As Needed  Allergies (verified): 1)  Codeine   ICD Specifications Following MD:  Lewayne Bunting, MD     ICD Vendor:  St Jude     ICD Model Number:  [phone]     ICD Serial Number:  [id number] ICD DOI:  04/05/2008     ICD Implanting MD:  Lewayne Bunting, MD  Lead 1:    Location: [facility]     DOI: 04/05/2008     Model #: [id number]     Serial #: [id number]     Status: active Lead 2:    Location: [facility]     DOI: 04/05/2008     Model #: [id number]     Serial #: [id number]     Status: active  Indications::  ICM   ICD Follow Up Remote Check?  No Battery Voltage:  3.19 V     Charge Time:  11.4 seconds     Battery Est. Longevity:  5.7 years ICD Dependent:  No       ICD Device Measurements Atrium:  Amplitude: 2.6 mV, Impedance: 360 ohms, Threshold: 0.5 V at 0.5 msec Right Ventricle:  Amplitude: 11.7 mV, Impedance: 330 ohms, Threshold: 1.0 V at 0.5 msec Shock Impedance: 66 ohms   Episodes MS Episodes:  5     Percent Mode Switch:  <1%     Coumadin:   No Shock:  0     ATP:  0     Nonsustained:  0     Atrial Pacing:  90%     Ventricular Pacing:  <1%  Brady Parameters Mode DDDR     Lower Rate Limit:  60     Upper Rate Limit 120 PAV 200     Sensed AV Delay:  200  Tachy Zones VF:  187     Next Cardiology Appt Due:  02/27/2010 Tech Comments:  No parameter changes.  Device function normal. Rate response blunted but adequate for the patient's level of activity. 5 mode switch episodes the longest 2:12 minutes, - coumadin, + plavix and ASA.   ROV 3 months with Dr. Ladona Ridgel. Altha Harm, LPN  December 12, 2009 10:30 AM  MD Comments:  Agree with above.

## 2010-10-31 NOTE — Progress Notes (Signed)
  Phone Note Outgoing Call   Call placed by: Lisabeth Devoid RN,  July 02, 2010 11:25 AM Call placed to: Patient Summary of Call: lipitor ready  Follow-up for Phone Call        Left message on machine lipitor is here and ready at the desk. Mylo Red RN

## 2010-10-31 NOTE — Progress Notes (Signed)
Summary: speak to nurse  Phone Note Call from Patient Call back at Home Phone [phone]   Caller: Patient Reason for Call: Talk to Nurse Summary of Call: request to speak to nurse.... did not receive call back yesterday about rx Initial call taken by: Migdalia Dk,  Feb 19, 2010 9:53 AM  Follow-up for Phone Call        NEED TO CHECK WITH [facility] ON REORDERING PT'S LIPITOR AND ALSO [facility] FOR PLAVIX. WILL CALL ON THURS. Follow-up by: Scherrie Bateman, LPN,  Feb 19, 2010 5:18 PM  Additional Follow-up for Phone Call Additional follow up Details #1::        CALLED [facility] RE LIPITOR 80 MG REORDERED .ORDER NUMBER IS AS FOLLOWS [id number] Scherrie Bateman, LPN  Feb 21, 2010 2:48 PM     Additional Follow-up for Phone Call Additional follow up Details #2::    OK Follow-up by: Gaylord Shih, MD, [facility],  Feb 21, 2010 3:45 PM

## 2010-10-31 NOTE — Progress Notes (Signed)
  Walk in Patient Form Recieved " Pt needs Cardiac Clearance for Knee Replacement" sent to [facility]  May 29, 2010 11:04 AM

## 2010-10-31 NOTE — Progress Notes (Signed)
Summary: refill/[facility] [place]/call pt once sent  Phone Note Refill Request Call back at Home Phone [phone] Message from:  Patient on March 01, 2010 11:17 AM  Refills Requested: Medication #1:  SPIRONOLACTONE 25 MG TABS Take 1 tablet by mouth every morning   Supply Requested: 3 months [facility] in [place], please call once sent   Method Requested: Fax to Local Pharmacy Initial call taken by: Migdalia Dk,  March 01, 2010 11:18 AM    Prescriptions: SPIRONOLACTONE 25 MG TABS (SPIRONOLACTONE) Take 1 tablet by mouth every morning  #30 x 11   Entered by:   Danielle Rankin, CMA   Authorized by:   Gaylord Shih, MD, [facility]   Signed by:   Danielle Rankin, CMA on 03/01/2010   Method used:   Electronically to        [facility]  [address] (retail)       [address]       [place], [place]  [address]       Ph: [phone]       Fax: [phone]   RxID:   [id number]

## 2010-10-31 NOTE — Cardiovascular Report (Signed)
Summary: Office Visit   Office Visit   Imported By: Roderic Ovens 10/11/2009 16:18:34  _____________________________________________________________________  External Attachment:    Type:   Image     Comment:   External Document

## 2010-10-31 NOTE — Letter (Signed)
[facility]   Imported By: Diana Eves 01/01/2010 15:17:07  _____________________________________________________________________  External Attachment:    Type:   Image     Comment:   External Document

## 2010-10-31 NOTE — Letter (Signed)
[facility]   Imported By: Marylou Mccoy 07/15/2010 12:12:18  _____________________________________________________________________  External Attachment:    Type:   Image     Comment:   External Document

## 2010-10-31 NOTE — Assessment & Plan Note (Signed)
Summary: 4 month rov/need carotid u/s sameday/sl   Visit Type:  4 mo f/u Primary Provider:  Dr Earnstine Regal  CC:  carotids done today...Marland KitchenMarland KitchenMarland Kitchenpt denies any other cardiac complaints today.  History of Present Illness: [name] returns today for evaluation and management of his complex cardiac and vascular history.  He is having no angina or ischemic equivalent symptoms. His mobility has been reduced by left knee pain. Dr. Magnus Ivan and I spoke about a redo total knee replacement which I think is prohibited by his high risk vascular status.  A carotid Doppler today which are unchanged. He is having no symptoms of TIAs or mini strokes. He is compliant with his medications.  Current Medications (verified): 1)  Coreg 12.5 Mg Tabs (Carvedilol) .[name].. 1 Tablet By Mouth Twice A Day 2)  Lasix 40 Mg Tabs (Furosemide) .... 1/2 Tab As Needed Take When Weight Is Increased 3)  Lipitor 80 Mg Tabs (Atorvastatin Calcium) .... Take 1 Tablet By Mouth Once A Day 4)  Lisinopril 10 Mg Tabs (Lisinopril) .... Take 1 Tablet By Mouth Twice A Day 5)  Nitroglycerin 0.4 Mg Subl (Nitroglycerin) .... Place 1 Tablet Under Tongue As Directed 6)  Plavix 75 Mg Tabs (Clopidogrel Bisulfate) .[name].. 1 Once Daily 7)  Spironolactone 25 Mg Tabs (Spironolactone) .[name].. 1 Tab Once Daily 8)  Aspirin Ec 325 Mg Tbec (Aspirin) .... Take One Tablet By Mouth Daily 9)  Glucophage Xr 500 Mg Xr24h-Tab (Metformin Hcl) .[name].. 1 Two Times A Day As Directed 10)  Amaryl 4 Mg Tabs (Glimepiride) .[name].. 1 Once Daily 11)  Colace 100 Mg Caps (Docusate Sodium) .... 2 Every Pm 12)  Baclofen 10 Mg Tabs (Baclofen) .... 4 Tabs Daily 13)  Percocet 5-325 Mg Tabs (Oxycodone-Acetaminophen) .... 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  coronary artery bypass grafting x3 08/29/2007    Salvatore Decent. Cornelius Moras, M.D. implantation of a  St. Jude dual-chamber defibrillator  04/05/2008   Doylene Canning. Ladona Ridgel, MD   Family History: Last updated: 18-Mar-2009  Mother died at age 24 of CVA, MI, and diabetes.  Father   died at 85 black lung and MI.  He has 2 sisters, 1 has diabetes.  He has   a brother who has a history of CAD and CABG.   Social History: Last updated: 03-18-2009  He lives in [place] with his wife.  He is retired   and been on disability since his stroke in the 1990s.  He had a 15-pack-   year history of tobacco abuse, quitting about 20 years ago.  He denies   any alcohol or drugs and is not routinely exercising.      Review of Systems       negative other than history of present illness  Vital Signs:  Patient profile:   65 year old male Height:      73 inches Weight:      200 pounds BMI:     26.48 Pulse  rate:   72 / minute Pulse rhythm:   regular BP sitting:   122 / 72  (left arm) Cuff size:   large  Vitals Entered By: Danielle Rankin, CMA (July 18, 2010 9:56 AM)  Physical Exam  General:  chronically ill, in no acute distress Head:  normocephalic and atraumatic Eyes:  PERRLA/EOM intact; conjunctiva and lids normal. Neck:  Neck supple, no JVD. No masses, thyromegaly or abnormal cervical nodes. Lungs:  Clear bilaterally to auscultation and percussion. Heart:   PMI displaced inferiorly laterally, irregular rate and rhythm, no gallop, carotid bruits bilaterally Msk:  decreased ROM.   Pulses:  diminished but present the lower extremities Extremities:  1+ left pedal edema and 1+ right pedal edema.   Neurologic:  Alert and oriented x 3. Skin:  Intact without lesions or rashes. Psych:  depressed affect.      ICD Specifications Following MD:  Lewayne Bunting, MD     ICD Vendor:  St Jude     ICD Model Number:  [phone]     ICD Serial Number:  [id number] ICD DOI:  04/05/2008     ICD Implanting MD:  Lewayne Bunting, MD  Lead 1:    Location: [facility]     DOI: 04/05/2008     Model #: [id number]     Serial #: [id number]     Status: active Lead 2:    Location: RV     DOI: 04/05/2008     Model #: 2956     Serial #: [id number]     Status: active  Indications::  ICM   ICD Follow Up ICD Dependent:  No      Episodes Coumadin:  No  Brady Parameters Mode DDDR     Lower Rate Limit:  60     Upper Rate Limit 120 PAV 200     Sensed AV Delay:  200  Tachy Zones VF:  187     Impression & Recommendations:  Problem # 1:  CARDIOMYOPATHY, ISCHEMIC (ICD-414.8) Assessment Unchanged  His updated medication list for this problem includes:    Coreg 12.5 Mg Tabs (Carvedilol) .[name]... 1 tablet by mouth twice a day    Lasix 40 Mg Tabs (Furosemide) .[name]... 1/2 tab as needed take when weight is increased    Lisinopril 10 Mg Tabs (Lisinopril) .[name]... Take 1 tablet by mouth twice a day    Nitroglycerin 0.4 Mg Subl (Nitroglycerin) .[name]... Place 1 tablet under tongue as directed    Plavix 75 Mg Tabs (Clopidogrel bisulfate) .[name]... 1 once daily    Spironolactone 25 Mg Tabs (Spironolactone) .[name]... 1 tab once daily    Aspirin Ec 325 Mg Tbec (Aspirin) .[name]... Take one tablet by mouth daily  Problem # 2:  CONGESTIVE HEART FAILURE, SYSTOLIC, CHRONIC (ICD-428.0) Assessment: Unchanged  His updated medication list for this problem includes:    Coreg 12.5 Mg Tabs (Carvedilol) .[name]... 1 tablet by mouth twice a day     Lasix 40 Mg Tabs (Furosemide) .[name]... 1/2 tab as needed take when weight is increased    Lisinopril 10 Mg Tabs (Lisinopril) .[name]... Take 1 tablet by mouth twice a day    Nitroglycerin 0.4 Mg Subl (Nitroglycerin) .[name]... Place 1 tablet under tongue as directed    Plavix 75 Mg Tabs (Clopidogrel bisulfate) .[name]... 1 once daily    Spironolactone 25 Mg Tabs (Spironolactone) .[name]... 1 tab once daily    Aspirin Ec 325 Mg Tbec (Aspirin) .[name]... Take one tablet by mouth daily  Patient Instructions: 1)  Your physician recommends that you schedule a follow-up appointment in: 6 months

## 2010-10-31 NOTE — Medication Information (Signed)
Summary: [facility]   Imported By: Kassie Mends 12/20/2009 08:52:18  _____________________________________________________________________  External Attachment:    Type:   Image     Comment:   External Document

## 2010-10-31 NOTE — Procedures (Signed)
Summary: sjm pacer check   Current Medications (verified): 1)  Coreg 12.5 Mg Tabs (Carvedilol) .[name].. 1 Tablet By Mouth Twice A Day 2)  Lasix 40 Mg Tabs (Furosemide) .... Take 1 Tablet By Mouth Once A Day 3)  Lipitor 80 Mg Tabs (Atorvastatin Calcium) .... Take 1 Tablet By Mouth Once A Day 4)  Lisinopril 10 Mg Tabs (Lisinopril) .... Take 1 Tablet By Mouth Twice A Day 5)  Nitroglycerin 0.4 Mg Subl (Nitroglycerin) .... Place 1 Tablet Under Tongue As Directed 6)  Plavix 75 Mg Tabs (Clopidogrel Bisulfate) .[name].. 1 Once Daily 7)  Spironolactone 25 Mg Tabs (Spironolactone) .... As Needed 8)  Ecotrin 325 Mg Tbec (Aspirin) .[name].. 1 Qd 9)  Glucophage Xr 500 Mg Xr24h-Tab (Metformin Hcl) .[name].. 1 Two Times A Day As Directed 10)  Amaryl 4 Mg Tabs (Glimepiride) .[name].. 1 Once Daily 11)  Colace 100 Mg Caps (Docusate Sodium) .... 2 Every Pm 12)  Baclofen 10 Mg Tabs (Baclofen) .... 4 Tabs Daily 13)  Percocet 5-325 Mg Tabs (Oxycodone-Acetaminophen) .... One Every 4 Hours As Needed  Allergies (verified): 1)  Codeine   ICD Specifications Following MD:  Lewayne Bunting, MD     ICD Vendor:  St Jude     ICD Model Number:  [phone]     ICD Serial Number:  [id number] ICD DOI:  04/05/2008     ICD Implanting MD:  Lewayne Bunting, MD  Lead 1:    Location: RA     DOI: 04/05/2008     Model #: 1699TC     Serial #: [id number]     Status: active Lead 2:    Location: RV     DOI: 04/05/2008     Model #: 2956     Serial #: [id number]     Status: active  Indications::  ICM   ICD Follow Up Remote Check?  No Battery Voltage:  3.14 V     Charge Time:  11.5 seconds     Battery Est. Longevity:  5.0 years Underlying rhythm:  Brady ICD Dependent:  No       ICD Device Measurements Atrium:  Amplitude: 3.2 mV, Impedance: 360 ohms, Threshold: 0.5 V at 0.5 msec Right Ventricle:  Amplitude: 11.7 mV, Impedance: 330 ohms, Threshold: 0.75 V at 0.5 msec Shock Impedance: 70 ohms   Episodes MS Episodes:  1     Percent Mode Switch:  <1%     Coumadin:   No Shock:  0     ATP:  0     Nonsustained:  0     Atrial Pacing:  93%     Ventricular Pacing:  <1%  Brady Parameters Mode DDDR     Lower Rate Limit:  60     Upper Rate Limit 120 PAV 200     Sensed AV Delay:  200  Tachy Zones VF:  187     Next Cardiology Appt Due:  08/29/2010 Tech Comments:  No parameter changes. Device function normal.  ROV 3 months clinic. Altha Harm, LPN  June 17, 2010 10:29 AM

## 2010-10-31 NOTE — Progress Notes (Signed)
  Walk in Patient Form Recieved " Pt need refill on Lasix 40mg  1qd" forwarded to Message Nurse [facility]  December 12, 2009 1:01 PM

## 2010-10-31 NOTE — Letter (Signed)
[facility]   Imported By: Roderic Ovens 01/21/2010 16:27:09  _____________________________________________________________________  External Attachment:    Type:   Image     Comment:   External Document

## 2010-10-31 NOTE — Cardiovascular Report (Signed)
Summary: Office Visit   Office Visit   Imported By: Roderic Ovens 12/27/2009 11:12:23  _____________________________________________________________________  External Attachment:    Type:   Image     Comment:   External Document

## 2010-10-31 NOTE — Assessment & Plan Note (Signed)
Summary: [facility]   Visit Type:  Follow-up Primary Provider:  Dr Earnstine Regal   History of Present Illness: Mr. Meister returns today for ICD followup.  He was recently discharged from the hospital.  He has a h/o Chronic systolic heart failure and sinus bradycardia and is s/p ICD.  He had a stress test in the hospital which demonstrated mostly scar with minimal ischemia.  He has not had any intercurrent ICD therapies.  He notes some fatigue.  Current Medications (verified): 1)  Coreg 12.5 Mg Tabs (Carvedilol) .[name].. 1 Tablet By Mouth Twice A Day 2)  Lasix 40 Mg Tabs (Furosemide) .... Take 1 Tablet By Mouth Once A Day 3)  Lipitor 80 Mg Tabs (Atorvastatin Calcium) .... Take 1 Tablet By Mouth Once A Day 4)  Lisinopril 10 Mg Tabs (Lisinopril) .... Take 1 Tablet By Mouth Twice A Day 5)  Nitroglycerin 0.4 Mg Subl (Nitroglycerin) .... Place 1 Tablet Under Tongue As Directed 6)  Plavix 75 Mg Tabs (Clopidogrel Bisulfate) .[name].. 1 Once Daily 7)  Spironolactone 25 Mg Tabs (Spironolactone) .... Take 1 Tablet By Mouth Every Morning 8)  Ecotrin 325 Mg Tbec (Aspirin) .[name].. 1 Qd 9)  Glucophage Xr 500 Mg Xr24h-Tab (Metformin Hcl) .[name].. 1 Two Times A Day As Directed 10)  Amaryl 4 Mg Tabs (Glimepiride) .[name].. 1 Once Daily 11)  Colace 100 Mg Caps (Docusate Sodium) .... 2 Every Pm 12)  Baclofen 10 Mg Tabs (Baclofen) .... 4 Tabs Daily 13)  Percocet 5-325 Mg Tabs (Oxycodone-Acetaminophen) .... One Every 4 Hours As Needed  Allergies (verified): 1)  Codeine  Past History:  Past Medical History: Last updated: 03/12/2009 CEREBROVASCULAR DISEASE (ICD-437.9) ANEMIA, IRON DEFICIENCY (ICD-280.9) SINUS BRADYCARDIA (ICD-427.81) TOBACCO ABUSE (ICD-305.1) ARM PAIN (ICD-729.5) RENAL INSUFFICIENCY, CHRONIC (ICD-585.9) HYPERTENSION (ICD-401.9) OSTEOARTHRITIS (ICD-715.90) DIABETES MELLITUS (ICD-250.00) ANEMIA (ICD-285.9) MITRAL REGURGITATION, 0 (MILD) (ICD-396.3) CONGESTIVE HEART FAILURE, SYSTOLIC, CHRONIC  (ICD-428.0) PVD (ICD-443.9) CARDIOMYOPATHY, ISCHEMIC (ICD-414.8) CORONARY ARTERY BYPASS GRAFT, HX OF (ICD-V45.81) HEART ATTACK (ICD-410.90) CAD (ICD-414.00) ANGINA, UNSTABLE (ICD-411.1)  Past Surgical History: Last updated: 03/12/2009   Percutaneous coronary intervention  01/10/2005  Charlies Constable, M.D.  coronary artery bypass grafting x3 08/29/2007    Salvatore Decent. Cornelius Moras, M.D. implantation of a  St. Jude dual-chamber defibrillator  04/05/2008   Doylene Canning. Ladona Ridgel, MD   Review of Systems  The patient denies chest pain, syncope, dyspnea on exertion, and peripheral edema.    Vital Signs:  Patient profile:   65 year old male Height:      73 inches Weight:      203 pounds BMI:     26.88 Pulse rate:   79 / minute BP sitting:   144 / 98  (left arm)  Vitals Entered By: Laurance Flatten CMA (March 08, 2010 10:03 AM)  Physical Exam  General:  chronically ill, baseline right hemiplegia Head:  normocephalic and atraumatic Eyes:  PERRLA/EOM intact; conjunctiva and lids normal. Mouth:  Teeth, gums and palate normal. Oral mucosa normal. Neck:  Neck supple, no JVD. No masses, thyromegaly or abnormal cervical nodes. Chest Wall:  Well healed ICD incision. Lungs:  Clear bilaterally to auscultation with no wheezes, rales, or rhonchi. Heart:  regular rate and rhythm soft systolic murmur at the apex, PMI displaced inferolaterally, no gallop. No obvious carotid bruits    ICD Specifications Following MD:  Lewayne Bunting, MD     ICD Vendor:  [employer] Jude     ICD Model Number:  [phone]     ICD Serial Number:  [id number]  ICD DOI:  04/05/2008     ICD Implanting MD:  Lewayne Bunting, MD  Lead 1:    Location: [facility]     DOI: 04/05/2008     Model #: 1699TC     Serial #: [id number]     Status: active Lead 2:    Location: RV     DOI: 04/05/2008     Model #: [id number]     Serial #: [id number]     Status: active  Indications::  ICM   ICD Follow Up Battery Voltage:  3.16 V     Charge Time:  11.1 seconds     Battery Est. Longevity:  5.5  YRS Underlying rhythm:  SR @ 60 ICD Dependent:  No       ICD Device Measurements Atrium:  Amplitude: 3.3 mV, Impedance: 330 ohms, Threshold: 0.5 V at 0.5 msec Right Ventricle:  Amplitude: 11.8 mV, Impedance: 300 ohms, Threshold: 1.0 V at 0.5 msec Shock Impedance: 62 ohms   Episodes MS Episodes:  7     Percent Mode Switch:  <0.1%     Coumadin:  No Shock:  0     ATP:  0     Nonsustained:  0     Atrial Therapies:  0 Atrial Pacing:  88%     Ventricular Pacing:  <1%  Brady Parameters Mode DDDR     Lower Rate Limit:  60     Upper Rate Limit 120 PAV 200     Sensed AV Delay:  200  Tachy Zones VF:  187     Tech Comments:  AMS EPISODES DUE TO COMPETITIVE PACING. DISCUSSED TURNING OFF RATE RESPONSE.  NORMAL THRESHOLD TESTING.  NO CHANGES MADE. Vella Kohler  March 08, 2010 10:43 AM MD Comments:  Normal device function.  Impression & Recommendations:  Problem # 1:  AUTOMATIC IMPLANTABLE CARDIAC DEFIBRILLATOR SITU (ICD-V45.02) His device is working normally.  Will recheck in several months.  Problem # 2:  ATRIAL FIBRILLATION (ICD-427.31)  He is in NSR today.  Continue meds as below. His updated medication list for this problem includes:    Coreg 12.5 Mg Tabs (Carvedilol) .[name]... 1 tablet by mouth twice a day    Plavix 75 Mg Tabs (Clopidogrel bisulfate) .[name]... 1 once daily    Ecotrin 325 Mg Tbec (Aspirin) .[name]... 1 qd  His updated medication list for this problem includes:    Coreg 12.5 Mg Tabs (Carvedilol) .[name]... 1 tablet by mouth twice a day    Plavix 75 Mg Tabs (Clopidogrel bisulfate) .[name]... 1 once daily    Ecotrin 325 Mg Tbec (Aspirin) .[name]... 1 qd  His updated medication list for this problem includes:    Coreg 12.5 Mg Tabs (Carvedilol) .[name]... 1 tablet by mouth twice a day    Plavix 75 Mg Tabs (Clopidogrel bisulfate) .[name]... 1 once daily    Ecotrin 325 Mg Tbec (Aspirin) .[name]... 1 qd  Problem # 3:  CONGESTIVE HEART FAILURE, SYSTOLIC, CHRONIC (ICD-428.0) He remains class 2.  Continue meds as  below. His updated medication list for this problem includes:    Coreg 12.5 Mg Tabs (Carvedilol) .[name]... 1 tablet by mouth twice a day    Lasix 40 Mg Tabs (Furosemide) .[name]... Take 1 tablet by mouth once a day    Lisinopril 10 Mg Tabs (Lisinopril) .[name]... Take 1 tablet by mouth twice a day    Nitroglycerin 0.4 Mg Subl (Nitroglycerin) .[name]... Place 1 tablet under tongue as directed    Plavix 75 Mg Tabs (  Clopidogrel bisulfate) .[name]... 1 once daily    Spironolactone 25 Mg Tabs (Spironolactone) .[name]... Take 1 tablet by mouth every morning    Ecotrin 325 Mg Tbec (Aspirin) .[name]... 1 qd  Patient Instructions: 1)  Your physician recommends that you continue on your current medications as directed. Please refer to the Current Medication list given to you today. 2)  Your physician wants you to follow-up in:  1 YEAR with Dr Ladona Ridgel.  You will receive a reminder letter in the mail two months in advance. If you don't receive a letter, please call our office to schedule the follow-up appointment.

## 2010-10-31 NOTE — Progress Notes (Signed)
Summary: pt only have part a/b medicare  Phone Note Call from Patient Call back at [facility] Phone [phone]   Caller: Patient Reason for Call: Talk to Nurse Details for Reason: pt only medicare part a & b.  Summary of Call: Le Initial call taken by: Lorne Skeens,  December 12, 2009 11:54 AM  Follow-up for Phone Call        Left message for patient to call back. Follow-up by: Altha Harm, LPN,  December 12, 2009 3:43 PM  Additional Follow-up for Phone Call Additional follow up Details #1::        Left 2nd message for patient to call back. Additional Follow-up by: Altha Harm, LPN,  December 13, 2009 11:28 AM    Additional Follow-up for Phone Call Additional follow up Details #2::    Spoke with patient.  He was concerned if his having pain along his right side (flank) had anything to do with his device check yesterday.  The discomfort is less today.  I reassured him that this pain is not coming from his device and he should follow up with his PCP if it continues. Follow-up by: Altha Harm, LPN,  December 13, 2009 3:19 PM

## 2010-10-31 NOTE — Progress Notes (Signed)
Summary: rx called into [facility]  Phone Note Refill Request Call back at [facility] [phone] Message from:  Patient on Feb 18, 2010 12:21 PM  Refills Requested: Medication #1:  PLAVIX 75 MG TABS 1 once daily  Medication #2:  LIPITOR 80 MG TABS Take 1 tablet by mouth once a day medical assistance .   Method Requested: Pick up at Office Initial call taken by: Lorne Skeens,  Feb 18, 2010 12:22 PM

## 2010-10-31 NOTE — Progress Notes (Signed)
Summary: pt needs refill  Phone Note Refill Request Call back at Home Phone [phone] Message from:  Patient  Refills Requested: Medication #1:  PLAVIX 75 MG TABS 1 once daily pls order pt meds and he was wondering if he could do lipitor like that   Initial call taken by: Omer Jack,  November 07, 2009 10:23 AM  Follow-up for Phone Call        CMA s/w pt to verify message about plavix and lipitor..Pt was confused about where he gets his plavix filled.. After talking to pt I found out pt gets med filled w/[facility] .Marland KitchenTold him I was in the [place] office today and I will be in [place] tomorrow and will send off rx's then..Pt gave verbal understanding. Danielle Rankin, CMA  November 07, 2009 4:49 PM     Prescriptions: PLAVIX 75 MG TABS (CLOPIDOGREL BISULFATE) 1 once daily  #30 x 11   Entered by:   Danielle Rankin, CMA   Authorized by:   Gaylord Shih, MD, [facility]   Signed by:   Danielle Rankin, CMA on 11/07/2009   Method used:   Electronically to        [facility]  [address] (retail)       [address]       [place], [place]  [address]       Ph: [phone]       Fax: [phone]   RxID:   [id number]   Appended Document: pt needs refill PT AWARE PLAVIX ORDERED WILL CALL ONCE ARRIVES AT OFFICE. NEEDS LIPITOR 40 MG SAMPLES AS WELL  Appended Document: pt needs refill PT AWARE PLAVIX  HERE MAY PICK UP AT FRONT DESK./CY

## 2010-10-31 NOTE — Procedures (Signed)
Summary: device check/sl   Current Medications (verified): 1)  Coreg 12.5 Mg Tabs (Carvedilol) .[name].. 1 Tablet By Mouth Twice A Day 2)  Lasix 40 Mg Tabs (Furosemide) .... 1/2 Tab As Needed Take When Weight Is Increased 3)  Lipitor 80 Mg Tabs (Atorvastatin Calcium) .... Take 1 Tablet By Mouth Once A Day 4)  Lisinopril 10 Mg Tabs (Lisinopril) .... Take 1 Tablet By Mouth Twice A Day 5)  Nitroglycerin 0.4 Mg Subl (Nitroglycerin) .... Place 1 Tablet Under Tongue As Directed 6)  Plavix 75 Mg Tabs (Clopidogrel Bisulfate) .[name].. 1 Once Daily 7)  Spironolactone 25 Mg Tabs (Spironolactone) .[name].. 1 Tab Once Daily 8)  Aspirin Ec 325 Mg Tbec (Aspirin) .... Take One Tablet By Mouth Daily 9)  Glucophage Xr 500 Mg Xr24h-Tab (Metformin Hcl) .[name].. 1 Two Times A Day As Directed 10)  Amaryl 4 Mg Tabs (Glimepiride) .[name].. 1 Once Daily 11)  Colace 100 Mg Caps (Docusate Sodium) .... 2 Every Pm 12)  Baclofen 10 Mg Tabs (Baclofen) .... 4 Tabs Daily 13)  Percocet 5-325 Mg Tabs (Oxycodone-Acetaminophen) .... One Every 4 Hours As Needed  Allergies (verified): 1)  Codeine   ICD Specifications Following MD:  Lewayne Bunting, MD     ICD Vendor:  St Jude     ICD Model Number:  [phone]     ICD Serial Number:  [id number] ICD DOI:  04/05/2008     ICD Implanting MD:  Lewayne Bunting, MD  Lead 1:    Location: [facility]     DOI: 04/05/2008     Model #: [id number]     Serial #: [id number]     Status: active Lead 2:    Location: RV     DOI: 04/05/2008     Model #: [id number]     Serial #: [id number]     Status: active  Indications::  ICM   ICD Follow Up Remote Check?  No Battery Voltage:  3.11 V     Charge Time:  11.5 seconds     Battery Est. Longevity:  4.8 years Underlying rhythm:  Brady ICD Dependent:  No       ICD Device Measurements Atrium:  Amplitude: 2.9 mV, Impedance: 350 ohms, Threshold: 0.5 V at 0.5 msec Right Ventricle:  Amplitude: 11.7 mV, Impedance: 330 ohms, Threshold: 1.0 V at 0.5 msec Shock Impedance: 69 ohms   Episodes MS Episodes:   14     Percent Mode Switch:  <1%     Coumadin:  No Shock:  0     ATP:  0     Nonsustained:  0     Atrial Pacing:  89%     Ventricular Pacing:  2.3%  Brady Parameters Mode DDDR     Lower Rate Limit:  60     Upper Rate Limit 120 PAV 200     Sensed AV Delay:  200  Tachy Zones VF:  187     Next Cardiology Appt Due:  11/28/2010 Tech Comments:  No parameter changes.  Device function normal.  Mode switch episodes up to 22 seconds, - coumadin.  No Merlin @ this time.  ROV 3 months clinic. Altha Harm, LPN  September 12, 2010 9:19 AM

## 2010-10-31 NOTE — Progress Notes (Signed)
Summary:  re lipitor   Phone Note Call from Patient   Caller: Patient Reason for Call: Talk to Nurse Summary of Call: pt now calling to see if his lipitor is here-pls call [phone] Initial call taken by: Glynda Jaeger,  May 20, 2010 12:03 PM  Follow-up for Phone Call        ok Follow-up by: Gaylord Shih, MD, [facility],  May 20, 2010 12:42 PM

## 2010-10-31 NOTE — Progress Notes (Signed)
  Phone Note Outgoing Call   Call placed by: Scherrie Bateman, LPN,  February 29, 980 3:58 PM Call placed to: Patient Summary of Call: CALLED PT TO LET KNOW  LIPITOR AND PLAVIX HERE LEFT AT FRONT DESK FOR PICK  UP. Initial call taken by: Scherrie Bateman, LPN,  February 28, 1913 4:00 PM

## 2010-10-31 NOTE — Progress Notes (Signed)
  Phone Note Outgoing Call   Call placed by: Scherrie Bateman, LPN,  December 06, 2009 10:44 AM Summary of Call: CALLED PT WAS ABLE TO GET LIPITOR THROUGH [facility] FOR PT. MED RECIEVED PT AWARE. WILL LEAVE AT FRONT DESK FOR PICK UP. Initial call taken by: Scherrie Bateman, LPN,  December 06, 2009 10:46 AM

## 2010-10-31 NOTE — Cardiovascular Report (Signed)
Summary: Office Visit   Office Visit   Imported By: Roderic Ovens 09/27/2010 16:22:04  _____________________________________________________________________  External Attachment:    Type:   Image     Comment:   External Document

## 2010-10-31 NOTE — Progress Notes (Signed)
Summary: rx lasix.[name]  Phone Note Refill Request Message from:  Patient on December 31, 2009 4:32 PM  Refills Requested: Medication #1:  LASIX 40 MG TABS Take 1 tablet by mouth once a day [facility] [phone]  Initial call taken by: Judie Grieve,  December 31, 2009 4:33 PM    Prescriptions: LASIX 40 MG TABS (FUROSEMIDE) Take 1 tablet by mouth once a day  #30 x 11   Entered by:   Danielle Rankin, CMA   Authorized by:   Gaylord Shih, MD, [facility]   Signed by:   Danielle Rankin, CMA on 12/31/2009   Method used:   Electronically to        [facility]  [address] (retail)       [address]       [place], [place]  [address]       Ph: [phone]       Fax: [phone]   RxID:   [id number]

## 2010-10-31 NOTE — Progress Notes (Signed)
Summary: plavix in yet?  Phone Note Call from Patient   Caller: Patient [phone] Reason for Call: Talk to Nurse Summary of Call: calling to see if plavix in yet-pls call [phone] Initial call taken by: Glynda Jaeger,  May 20, 2010 9:42 AM  Follow-up for Phone Call        please check on Follow-up by: Gaylord Shih, MD, [facility],  May 20, 2010 10:51 AM     Appended Document: plavix in yet? plavix samples are at the desk.  Left message on machine. Mylo Red RN BSN

## 2010-10-31 NOTE — Miscellaneous (Signed)
  Clinical Lists Changes  Orders: Added new Test order of Carotid Duplex (Carotid Duplex) - Signed

## 2010-10-31 NOTE — Progress Notes (Signed)
Summary: rx sent in already today by EMR  Phone Note Refill Request Message from:  Patient on July 03, 2010 1:32 PM  Refills Requested: Medication #1:  COREG 12.5 MG TABS 1 tablet by mouth twice a day [facility] [phone]  Initial call taken by: Judie Grieve,  July 03, 2010 1:33 PM  Follow-up for Phone Call        CMA sent rx by EMR already today. Danielle Rankin, CMA  July 03, 2010 1:58 PM

## 2010-10-31 NOTE — Progress Notes (Signed)
Summary: pt need samples of Plavix  Phone Note Call from Patient Call back at [facility] Phone [phone]   Caller: Patient Summary of Call: Pt need samples of Plavix Initial call taken by: Judie Grieve,  September 12, 2010 10:05 AM  Follow-up for Phone Call        Left message on voicemail. Samples at the desk today. Mylo Red RN

## 2010-10-31 NOTE — Progress Notes (Signed)
Summary: sob weakness  Phone Note Call from Patient Call back at Home Phone [phone]   Caller: Patient Summary of Call: pt states that he is having SOB took a nitro. right side weaker than normal. started yesterday worse now Initial call taken by: Edman Circle,  Feb 26, 2010 2:06 PM  Follow-up for Phone Call        pt called -pt states -B/P 121/71 now--currently short of breath since last night while sitting in recliner/pt denies edema or weight gain--pt states he is currently having tightness in his chest that has been unrelieved with NTG-he also is complaining of some weakness-pt states he called and talked with Dr on call last night and was advised to go to ER I advised pt to go to nearest ER for evaluation-I advised pt not to drive-I told him OK to use another NTG if B/P was 121/71 but he states B/P had been low last night(?90s/60s)-I advised pt not to use NTG if B/P that low

## 2010-10-31 NOTE — Cardiovascular Report (Signed)
Summary: Office Note   Office Note   Imported By: Roderic Ovens 03/12/2010 13:18:05  _____________________________________________________________________  External Attachment:    Type:   Image     Comment:   External Document

## 2010-10-31 NOTE — Assessment & Plan Note (Signed)
Summary: EPH/ GD   Primary Provider:  Dr Earnstine Regal  CC:  eph/pt had device check last week.  He states he has felt fine since discharge.  History of Present Illness: this is a 65 year old white male patient who was recently hospitalized with chest pain and heart failure. He has a history of ischemic cardiomyopathy status post CABG and an ejection fraction of 35%. He also has an automatic implantable cardioverter defibrillator. Lexus scan Myoview in the hospital revealed large anterior apical scar with inferior and septal scar. There was dyskinesis of the anterior septum. His ejection fraction was 40% with mild peri-infarct ischemia. This was felt to be a low risk study. He was treated medically.  The patient admits to mowing his lawn in the extreme heat which brought on his chest pain. He denies further chest pain palpitations dyspnea and dyspnea on exertion dizziness or presyncope since hospitalization.  Current Medications (verified): 1)  Coreg 12.5 Mg Tabs (Carvedilol) .[name].. 1 Tablet By Mouth Twice A Day 2)  Lasix 40 Mg Tabs (Furosemide) .... Take 1 Tablet By Mouth Once A Day 3)  Lipitor 80 Mg Tabs (Atorvastatin Calcium) .... Take 1 Tablet By Mouth Once A Day 4)  Lisinopril 10 Mg Tabs (Lisinopril) .... Take 1 Tablet By Mouth Twice A Day 5)  Nitroglycerin 0.4 Mg Subl (Nitroglycerin) .... Place 1 Tablet Under Tongue As Directed 6)  Plavix 75 Mg Tabs (Clopidogrel Bisulfate) .[name].. 1 Once Daily 7)  Spironolactone 25 Mg Tabs (Spironolactone) .... Take 1 Tablet By Mouth Every Morning 8)  Ecotrin 325 Mg Tbec (Aspirin) .[name].. 1 Qd 9)  Glucophage Xr 500 Mg Xr24h-Tab (Metformin Hcl) .[name].. 1 Two Times A Day As Directed 10)  Amaryl 4 Mg Tabs (Glimepiride) .[name].. 1 Once Daily 11)  Colace 100 Mg Caps (Docusate Sodium) .... 2 Every Pm 12)  Baclofen 10 Mg Tabs (Baclofen) .... 4 Tabs Daily 13)  Percocet 5-325 Mg Tabs (Oxycodone-Acetaminophen) .... One Every 4 Hours As Needed  Allergies (verified): 1)   Codeine  Past History:  Past Medical History: Last updated: 03/12/2009 CEREBROVASCULAR DISEASE (ICD-437.9) ANEMIA, IRON DEFICIENCY (ICD-280.9) SINUS BRADYCARDIA (ICD-427.81) TOBACCO ABUSE (ICD-305.1) ARM PAIN (ICD-729.5) RENAL INSUFFICIENCY, CHRONIC (ICD-585.9) HYPERTENSION (ICD-401.9) OSTEOARTHRITIS (ICD-715.90) DIABETES MELLITUS (ICD-250.00) ANEMIA (ICD-285.9) MITRAL REGURGITATION, 0 (MILD) (ICD-396.3) CONGESTIVE HEART FAILURE, SYSTOLIC, CHRONIC (ICD-428.0) PVD (ICD-443.9) CARDIOMYOPATHY, ISCHEMIC (ICD-414.8) CORONARY ARTERY BYPASS GRAFT, HX OF (ICD-V45.81) HEART ATTACK (ICD-410.90) CAD (ICD-414.00) ANGINA, UNSTABLE (ICD-411.1)  Social History: Reviewed history from 03/12/2009 and no changes required.  He lives in [place] with his wife.  He is retired   and been on disability since his stroke in the 1990s.  He had a 15-pack-   year history of tobacco abuse, quitting about 20 years ago.  He denies   any alcohol or drugs and is not routinely exercising.      Review of Systems       see history of present illness  Vital Signs:  Patient profile:   65 year old male Height:      73 inches Weight:      202 pounds BMI:     26.75 Pulse rate:   72 / minute Pulse rhythm:   regular BP sitting:   140 / 78  (left arm) Cuff size:   regular  Vitals Entered By: Judithe Modest CMA (March 11, 2010 8:50 AM)  Serial Vital Signs/Assessments:  Time      Position  BP       Pulse  Resp  Temp  By                     120/80                         Marletta Lor, PA-C   Physical Exam  General:   Well-nournished, in no acute distress. Neck:bilateral carotid bruits right greater than left, No JVD, HJR,  or thyroid enlargement Lungs: No tachypnea, clear without wheezing, rales, or rhonchi Cardiovascular: RRR, PMI not displaced, heart sounds normal, no murmurs, gallops, bruit, thrill, or heave. Abdomen: BS normal. Soft without organomegaly, masses, lesions or  tenderness. Extremities: +1 bilateral ankle and lower leg edema,without cyanosis, clubbing. Good distal pulses bilateral SKin: Warm, no lesions or rashes  Musculoskeletal: No deformities Neuro: no focal signs    EKG  Procedure date:  03/11/2010  Findings:      nsinus rhythm poor R-wave progression inferior Q waves no acute change   ICD Specifications Following MD:  Lewayne Bunting, MD     ICD Vendor:  St Jude     ICD Model Number:  2207-36     ICD Serial Number:  [id number] ICD DOI:  04/05/2008     ICD Implanting MD:  Lewayne Bunting, MD  Lead 1:    Location: [facility]     DOI: 04/05/2008     Model #: [id number]     Serial #: [id number]     Status: active Lead 2:    Location: [facility]     DOI: 04/05/2008     Model #: 1914     Serial #: [id number]     Status: active  Indications::  ICM   ICD Follow Up ICD Dependent:  No      Episodes Coumadin:  No  Brady Parameters Mode DDDR     Lower Rate Limit:  60     Upper Rate Limit 120 PAV 200     Sensed AV Delay:  200  Tachy Zones VF:  187     Impression & Recommendations:  Problem # 1:  CORONARY ARTERY BYPASS GRAFT, HX OF (ICD-V45.81) Patient has history of prior CABG in 2008. He was admitted with chest pain while mowing the lawn and extreme heat. DEXA Scan was low risk with very mild peri-infarct ischemia ejection fraction 40%. Medical therapy is indicated. No further changes were made. Patient denies any recurrent chest pain.  Problem # 2:  CONGESTIVE HEART FAILURE, SYSTOLIC, CHRONIC (ICD-428.0) Patient has mild ankle edema but overall he seems euvolemic today. We will give him a 2 g sodium diet and not make any changes. His updated medication list for this problem includes:    Coreg 12.5 Mg Tabs (Carvedilol) .[name]... 1 tablet by mouth twice a day    Lasix 40 Mg Tabs (Furosemide) .[name]... Take 1 tablet by mouth once a day    Lisinopril 10 Mg Tabs (Lisinopril) .[name]... Take 1 tablet by mouth twice a day    Nitroglycerin 0.4 Mg Subl (Nitroglycerin) .[name]... Place 1  tablet under tongue as directed    Plavix 75 Mg Tabs (Clopidogrel bisulfate) .[name]... 1 once daily    Spironolactone 25 Mg Tabs (Spironolactone) .[name]... Take 1 tablet by mouth every morning    Ecotrin 325 Mg Tbec (Aspirin) .[name]... 1 qd  His updated medication list for this problem includes:    Coreg 12.5 Mg Tabs (Carvedilol) .[name]... 1 tablet by mouth twice a day    Lasix 40 Mg Tabs (Furosemide) .Marland KitchenMarland KitchenMarland KitchenMarland Kitchen  Take 1 tablet by mouth once a day    Lisinopril 10 Mg Tabs (Lisinopril) .[name]... Take 1 tablet by mouth twice a day    Nitroglycerin 0.4 Mg Subl (Nitroglycerin) .[name]... Place 1 tablet under tongue as directed    Plavix 75 Mg Tabs (Clopidogrel bisulfate) .[name]... 1 once daily    Spironolactone 25 Mg Tabs (Spironolactone) .[name]... Take 1 tablet by mouth every morning    Ecotrin 325 Mg Tbec (Aspirin) .[name]... 1 qd  Problem # 3:  BRUIT (ICD-785.9) Patient has bilateral carotid bruits and prior right carotid endarterectomy. We will check carotid Dopplers with his next office visit.  Other Orders: EKG w/ Interpretation (93000)  Patient Instructions: 1)  Your physician recommends that you schedule a follow-up appointment in: 4-6 months with Dr. wall 2)  Your physician has requested that you limit the intake of sodium (salt) in your diet to two grams daily. Please see [facility] handout. 3)  Your physician has recommended you make the following change in your medication:  4)  Your physician has requested that you have a carotid duplex. This test is an ultrasound of the carotid arteries in your neck. It looks at blood flow through these arteries that supply the brain with blood. Allow one hour for this exam. There are no restrictions or special instructions. Same day as Appt. with Dr. Daleen Squibb.

## 2010-10-31 NOTE — Cardiovascular Report (Signed)
Summary: Office Visit   Office Visit   Imported By: Roderic Ovens 07/08/2010 11:32:15  _____________________________________________________________________  External Attachment:    Type:   Image     Comment:   External Document

## 2010-10-31 NOTE — Progress Notes (Signed)
Summary: rx lipitor called into [facility] today  Phone Note Outgoing Call Call back at [facility]    Summary of Call: CMA called [facility] pt assistance program to order Lipitor for pt... Danielle Rankin, CMA  June 17, 2010 1:13 PM

## 2010-11-08 ENCOUNTER — Encounter (HOSPITAL_BASED_OUTPATIENT_CLINIC_OR_DEPARTMENT_OTHER): Payer: Medicare Other | Admitting: Oncology

## 2010-11-08 DIAGNOSIS — E538 Deficiency of other specified B group vitamins: Secondary | ICD-10-CM

## 2010-12-12 ENCOUNTER — Encounter (INDEPENDENT_AMBULATORY_CARE_PROVIDER_SITE_OTHER): Payer: Medicare Other

## 2010-12-12 ENCOUNTER — Encounter: Payer: Self-pay | Admitting: Internal Medicine

## 2010-12-12 DIAGNOSIS — I428 Other cardiomyopathies: Secondary | ICD-10-CM

## 2010-12-16 ENCOUNTER — Other Ambulatory Visit (HOSPITAL_COMMUNITY): Payer: Self-pay | Admitting: Oncology

## 2010-12-16 ENCOUNTER — Encounter (HOSPITAL_BASED_OUTPATIENT_CLINIC_OR_DEPARTMENT_OTHER): Payer: Medicare Other | Admitting: Oncology

## 2010-12-16 DIAGNOSIS — E538 Deficiency of other specified B group vitamins: Secondary | ICD-10-CM

## 2010-12-16 DIAGNOSIS — D649 Anemia, unspecified: Secondary | ICD-10-CM

## 2010-12-16 DIAGNOSIS — N289 Disorder of kidney and ureter, unspecified: Secondary | ICD-10-CM

## 2010-12-16 LAB — URINALYSIS, ROUTINE W REFLEX MICROSCOPIC
Bilirubin Urine: NEGATIVE
Hgb urine dipstick: NEGATIVE
Ketones, ur: NEGATIVE mg/dL
Nitrite: NEGATIVE
Protein, ur: NEGATIVE mg/dL
Urobilinogen, UA: 0.2 mg/dL (ref 0.0–1.0)

## 2010-12-16 LAB — LACTATE DEHYDROGENASE: LDH: 183 U/L (ref 94–250)

## 2010-12-16 LAB — COMPREHENSIVE METABOLIC PANEL
ALT: 12 U/L (ref 0–53)
Alkaline Phosphatase: 58 U/L (ref 39–117)
BUN: 45 mg/dL — ABNORMAL HIGH (ref 6–23)
BUN: 29 mg/dL — ABNORMAL HIGH (ref 6–23)
CO2: 27 mEq/L (ref 19–32)
CO2: 28 mEq/L (ref 19–32)
Calcium: 9 mg/dL (ref 8.4–10.5)
Creatinine, Ser: 1.42 mg/dL (ref 0.40–1.50)
GFR calc non Af Amer: 37 mL/min — ABNORMAL LOW (ref >60)
Glucose, Bld: 93 mg/dL (ref 70–99)
Glucose, Bld: 114 mg/dL — ABNORMAL HIGH (ref 70–99)
Sodium: 143 mEq/L (ref 135–145)
Total Bilirubin: 0.5 mg/dL (ref 0.3–1.2)
Total Protein: 6.4 g/dL (ref 6.0–8.3)

## 2010-12-16 LAB — LIPID PANEL
LDL Cholesterol: 38 mg/dL (ref 0–99)
VLDL: 10 mg/dL (ref 0–40)

## 2010-12-16 LAB — CK TOTAL AND CKMB (NOT AT ARMC)
Relative Index: INVALID (ref 0.0–2.5)
Relative Index: INVALID (ref 0.0–2.5)
Total CK: 95 U/L (ref 7–232)
Total CK: 95 U/L (ref 7–232)

## 2010-12-16 LAB — GLUCOSE, CAPILLARY
Glucose-Capillary: 171 mg/dL — ABNORMAL HIGH (ref 70–99)
Glucose-Capillary: 99 mg/dL (ref 70–99)
Glucose-Capillary: 127 mg/dL — ABNORMAL HIGH (ref 70–99)

## 2010-12-16 LAB — CBC WITH DIFFERENTIAL/PLATELET
BASO%: 0.4 % (ref 0.0–2.0)
Basophils Absolute: 0 10*3/uL (ref 0.0–0.1)
EOS%: 2.5 % (ref 0.0–7.0)
HCT: 39.4 % (ref 38.4–49.9)
LYMPH%: 19.7 % (ref 14.0–49.0)
MCH: 30.8 pg (ref 27.2–33.4)
MCHC: 33.7 g/dL (ref 32.0–36.0)
MCV: 91.3 fL (ref 79.3–98.0)
MONO%: 7.9 % (ref 0.0–14.0)
NEUT%: 69.5 % (ref 39.0–75.0)
Platelets: 188 10*3/uL (ref 140–400)
lymph#: 1.4 10*3/uL (ref 0.9–3.3)

## 2010-12-16 LAB — CBC
MCHC: 34.3 g/dL (ref 30.0–36.0)
MCV: 94.3 fL (ref 78.0–100.0)
RDW: 15 % (ref 11.5–15.5)

## 2010-12-16 LAB — HEMOGLOBIN A1C
Hgb A1c MFr Bld: 6.5 % — ABNORMAL HIGH (ref <5.7)
Mean Plasma Glucose: 140 mg/dL — ABNORMAL HIGH (ref <117)

## 2010-12-16 LAB — BASIC METABOLIC PANEL
BUN: 36 mg/dL — ABNORMAL HIGH (ref 6–23)
GFR calc Af Amer: 55 mL/min — ABNORMAL LOW (ref >60)
GFR calc non Af Amer: 45 mL/min — ABNORMAL LOW (ref >60)
Potassium: 4.1 mEq/L (ref 3.5–5.1)
Sodium: 142 mEq/L (ref 135–145)

## 2010-12-16 LAB — DIFFERENTIAL
Basophils Absolute: 0 10*3/uL (ref 0.0–0.1)
Basophils Relative: 0 % (ref 0–1)
Eosinophils Absolute: 0.4 10*3/uL (ref 0.0–0.7)
Lymphocytes Relative: 19 % (ref 12–46)
Neutrophils Relative %: 68 % (ref 43–77)

## 2010-12-16 LAB — TROPONIN I: Troponin I: <0.01 ng/mL (ref 0.00–0.06)

## 2010-12-16 LAB — FERRITIN: Ferritin: 88 ng/mL (ref 22–322)

## 2010-12-16 LAB — VITAMIN B12: Vitamin B-12: 351 pg/mL (ref 211–911)

## 2010-12-17 NOTE — Procedures (Signed)
Summary: device check/sl  mca   Current Medications (verified): 1)  Coreg 12.5 Mg Tabs (Carvedilol) .[name].. 1 Tablet By Mouth Twice A Day 2)  Lasix 40 Mg Tabs (Furosemide) .... 1/2 Tab As Needed Take When Weight Is Increased 3)  Lipitor 80 Mg Tabs (Atorvastatin Calcium) .... Take 1 Tablet By Mouth Once A Day 4)  Lisinopril 10 Mg Tabs (Lisinopril) .... Take 1 Tablet By Mouth Twice A Day 5)  Nitroglycerin 0.4 Mg Subl (Nitroglycerin) .... Place 1 Tablet Under Tongue As Directed 6)  Plavix 75 Mg Tabs (Clopidogrel Bisulfate) .[name].. 1 Once Daily 7)  Spironolactone 25 Mg Tabs (Spironolactone) .[name].. 1 Tab Once Daily 8)  Aspirin Ec 325 Mg Tbec (Aspirin) .... Take One Tablet By Mouth Daily 9)  Glucophage Xr 500 Mg Xr24h-Tab (Metformin Hcl) .[name].. 1 Two Times A Day As Directed 10)  Amaryl 4 Mg Tabs (Glimepiride) .[name].. 1 Once Daily 11)  Colace 100 Mg Caps (Docusate Sodium) .... 2 Every Pm 12)  Baclofen 10 Mg Tabs (Baclofen) .... 4 Tabs Daily 13)  Percocet 5-325 Mg Tabs (Oxycodone-Acetaminophen) .... One Every 4 Hours As Needed  Allergies (verified): 1)  Codeine   ICD Specifications Following MD:  Lewayne Bunting, MD     ICD Vendor:  St Jude     ICD Model Number:  [id number]     ICD Serial Number:  [id number] ICD DOI:  04/05/2008     ICD Implanting MD:  Lewayne Bunting, MD  Lead 1:    Location: [facility]     DOI: 04/05/2008     Model #: [id number]     Serial #: [id number]     Status: active Lead 2:    Location: [facility]     DOI: 04/05/2008     Model #: 2956     Serial #: [id number]     Status: active  Indications::  ICM   ICD Follow Up ICD Dependent:  No      Episodes Coumadin:  No  Brady Parameters Mode DDDR     Lower Rate Limit:  60     Upper Rate Limit 120 PAV 200     Sensed AV Delay:  200  Tachy Zones VF:  187     Tech Comments:  See PaceArt

## 2011-01-06 ENCOUNTER — Telehealth: Payer: Self-pay | Admitting: Internal Medicine

## 2011-02-11 NOTE — Assessment & Plan Note (Signed)
[facility]                            CARDIOLOGY OFFICE NOTE   NAME:[name], [name]                     MRN:          [id number]  DATE:10/26/2007                            DOB:          11-03-1945    [name] returns today for close follow-up.   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  That time he had PTCA of the LAD at an old stent site.   Because of severe anemia, he was not given Plavix, not to mention that  he had residual disease perhaps requiring surgery.  Unfortunately he  reoccluded on two separate case requiring PCI the LAD as well as the  ostial right coronary.  At that point he was in cardiogenic shock.   He recovered subsequently underwent coronary bypass grafting by Dr.  Tressie Stalker.  He has severe LV dysfunction with akinesia distal  anterior wall, apex and septum plus hypokinesia, inferior wall.  He had  a good quality left internal mammary graft placed and saphenous vein  graft which was used for grafting.  His distal right was small.  The  mammary was placed the LAD, vein graft to first circumflex marginal,  vein graft to posterior descending vessel.   He remarkably improved and went home on post-op day #4.   I saw him in the office on September 15, 2007.  He had postoperative  atrial fibrillation which had resolved.  Because of a very long  prolonged QTC interval we discontinue his amiodarone.  His LFTs were  also elevated.  They subsequently started to return to normal.   He has no complaints of orthopnea, PND, peripheral edema or angina.  He  has had no tachy palpitations, presyncope or syncope.  He does get a  little dyspneic on exertion.   His current meds are Glucophage 500 mg p.o. b.i.d., Amaryl 4 mg p.o.  b.i.d., aspirin 325 mg a day, Plavix 75 mg a day, baclofen 40 mg q.h.s.  potassium 20 mg daily, Protonix 40 mg daily, Lipitor 80 mg daily Lasix  40 mg daily, lisinopril 10  mg p.o. b.i.d., Coreg 12.5 mg p.o. b.i.d.  stool softener, Percocet.   He is blood pressure today was 110/62, pulse 65 and regular.  Weight is  204 down 3.  His skin color looks better.  He is alert and oriented.  HEENT:  Unchanged.  Carotids were equal bilaterally without obvious bruit.  Thyroid is not  enlarged.  Trachea is midline.  Lungs were clear without any fluid anteriorly or posteriorly.  Sternotomy site is stable.  PMI is nondisplaced.  He has normal S1-S2 without gallop.  ABDOMEN:  Soft, good bowel sounds.  No midline bruit.  No hepatomegaly.  EXTREMITIES:  Reveal no cyanosis, clubbing or edema.  Pulses were  diminished but present.  There is no sign of DVT.  NEURO:  Exam is grossly intact other than his residual from findings.   ASSESSMENT/PLAN:  [name] continues to improved.  His heart rate is  under good control his blood pressure is optimal.  He is due blood  work  including fasting lipids and follow up LFTs and a Chem-7.  We will also  schedule a 2-D echocardiogram to see if he had some recovery of LV  function.  Will see him back in 4 weeks.     Thomas C. Daleen Squibb, MD, [facility]  Electronically Signed    TCW/MedQ  DD: 10/26/2007  DT: 10/26/2007  Job #: [id number]   cc:   Salvatore Decent. Cornelius Moras, M.D.  [name]

## 2011-02-11 NOTE — Op Note (Signed)
NAMEAMAAD, BYERS [name]:  [id number]   MEDICAL RECORD [name]:  [id number]          PATIENT TYPE:  INP   LOCATION:  2304                         FACILITY:  [facility]   PHYSICIAN:  Salvatore Decent. Cornelius Moras, M.D. DATE OF BIRTH:  Jan 09, 1946   DATE OF PROCEDURE:  08/29/2007  DATE OF DISCHARGE:                               OPERATIVE REPORT   PREOPERATIVE DIAGNOSIS:  Severe three-vessel coronary artery disease,  status post acute ST-segment elevation myocardial infarction x3, with  severe ischemic cardiomyopathy, preoperative cardiogenic shock,  preoperative intra-aortic balloon pump placement, preoperative  ventilator-dependent respiratory failure.   POSTOPERATIVE DIAGNOSIS:  Severe three-vessel coronary artery disease,  status post acute ST-segment elevation myocardial infarction x3, with  severe ischemic cardiomyopathy, preoperative cardiogenic shock,  preoperative intra-aortic balloon pump placement, preoperative  ventilator-dependent respiratory failure.   PROCEDURE:  Urgent median sternotomy for coronary artery bypass grafting  x3 (left internal mammary artery to distal left anterior descending  coronary artery, saphenous vein graft to first circumflex marginal  branch, saphenous vein graft to posterior descending coronary artery,  endoscopic saphenous vein harvest from right thigh).   SURGEON:  Salvatore Decent. Cornelius Moras, M.D.   ASSISTANT:  Stephanie Acre. Dominick, PA.   ANESTHESIA:  General.   BRIEF CLINICAL NOTE:  The patient is a 65 year old male from [place]  with known history of coronary artery disease, hypertension, type 2  diabetes mellitus, hyperlipidemia, and a previous stroke.  The patient's  cardiac history dates back to 2006, at which time he presented with an  acute non-ST-segment elevation myocardial infarction.  He underwent  cardiac catheterization demonstrating severe three-vessel coronary  artery disease.  The patient initially was treated with percutaneous  coronary intervention and stenting of the left anterior descending  coronary artery and the right coronary artery.  The patient initially  did well.  However, the patient recently presented with an acute ST-  segment elevation myocardial infarction.  He was treated with balloon  angioplasty of the occluded left anterior descending coronary artery  where thrombosis had occurred in the proximal stent and proximal left  anterior descending coronary artery.  The patient initially stabilized,  and after considerable the debate as to appropriate subsequent therapy,  ultimately cardiac surgical consultation was requested.  The patient was  felt to be somewhat a high risk but satisfactory candidate for surgery  despite severe left ventricular dysfunction related to the patient's  recent acute ST-segment elevation myocardial infarction and his other  comorbid medical conditions.  Subsequent to this, the patient has  sustained two acute myocardial infarctions in the setting of transient  cessation of intravenous antiplatelet therapy, each time requiring  emergent balloon angioplasty.  The first time, balloon angioplasty and  stenting of the ostial right coronary artery was performed as well, and  the second time, both the right coronary artery and the left anterior  descending coronary artery had thrombosed.  This was complicated by  cardiogenic shock and respiratory failure, requiring intubation,  mechanical ventilation, and placement of the intra-aortic balloon pump.  The patient has subsequently recovered and remains stable, although he  continues to have gradual  slow decline of hemoglobin and hematocrit  while receiving intravenous Integrilin and heparin, concerning for the  possibility of acute blood loss.  Given the history of recurrent acute  thrombosis with three recent myocardial infarctions, urgent surgical  revascularization is felt to be likely the best course of therapy to  facilitate  stopping all blood thinners at least temporarily without the  potential for further acute myocardial infarction.  The patient's wife  and family have been counseled regarding the high-risk nature of  surgical intervention under the circumstances.  The patient is known to  have severe left ventricular dysfunction, although he remains  hemodynamically stable with intra-aortic balloon pump in place.  The  patient's wife and family understand and accept all potential associated  risks of surgery, including but not limited to risk of death, stroke,  ventricular failure requiring mechanical circulatory support, bleeding  requiring blood transfusion, arrhythmia, infection, and recurrent  coronary artery disease.  They understand that given the severe left  ventricular dysfunction the patient's long term prognosis may be guarded  at best, regardless of his subsequent therapy.  All of their questions  have been addressed.   OPERATIVE FINDINGS:  1. Severe left ventricular dysfunction, with akinesis of the distal      anterior wall, apex, and septum, plus hypokinesis of the inferior      wall  2. Good-quality left internal mammary artery and saphenous vein      conduit for grafting  3. Good-quality target vessels for grafting, with the exception of the      distal right coronary circulation, where the posterior descending      coronary artery is somewhat small-caliber, but nonetheless free of      disease at the site of distal grafting.   OPERATIVE NOTE IN DETAIL:  The patient is brought directly from the  cardiac intensive care unit to the operating room on the morning of  November 30.  The patient is placed in the supine position on the  operating table.  Adequate general anesthesia is verified under the care  and direction of Dr. Lacretia Nicks. Autumn Patty.  Intravenous antibiotics are  administered.  The patient's anterior chest, abdomen, both groins, and  both lower extremities are prepared and  draped in a sterile manner.  Baseline transesophageal echocardiogram is performed by Dr. Sampson Goon.  This confirms severe left trigger dysfunction, with essentially akinesis  of the distal anterior wall, apex, and septum.  There is hypokinesis of  the high inferior wall.  The lateral wall appears to be normal and in  fact is hyperdynamic.  The mitral valve is normal.  The aortic valve is  normal.   A median sternotomy incision is performed, and the left internal mammary  artery is dissected from the chest wall and prepared for bypass  grafting.  The left internal mammary artery is a good-quality conduit.  Simultaneously, saphenous vein was obtained the patient's right thigh  using endoscopic vein harvest technique.  The saphenous vein is notably  a good-quality conduit.  After the saphenous vein is removed, the small  incision in the right thigh is closed in multiple layers with running  absorbable suture.  The patient is heparinized systemically.  The left  internal mammary artery is transected distally and noted to have  excellent flow.   The pericardium is opened.  The ascending aorta is mildly sclerotic but  otherwise normal in appearance.  The ascending aorta is cannulated for  cardiopulmonary bypass.  A venous cannula  is placed through the tip of  the right atrial appendage.  A retrograde cardioplegia catheter is  placed through the right atrium into the coronary sinus.  Cardiopulmonary bypass is begun, and the surface of the heart is  inspected.  There is evidence of obvious recent acute transmural  myocardial infarction involving the entire distal anterior wall and  apex.  A temperature probe is placed in the left ventricular septum, and  the cardioplegic catheter is placed in the ascending aorta.   The patient is allowed to cool passively to 32 degrees systemic  temperature.  The aortic crossclamp is applied, and cold blood  cardioplegia is delivered initially in an  antegrade fashion through the  aortic root.  Supplemental cardioplegia is administered retrograde  through the coronary sinus catheter, and iced saline slush is applied  for topical hypothermia.  The initial cardioplegic arrest and myocardial  cooling is felt to be excellent.  Repeat doses of cardioplegia are  administered intermittently throughout the crossclamp portion of the  operation through the aortic root, down subsequently placed vein grafts,  and retrograde through the coronary sinus catheter to maintain left  ventricular septal temperature below 15 degrees centigrade.   The following distal coronary anastomoses are performed:  1. The first circumflex marginal branch is grafted with a saphenous      vein graft in an end-to-side fashion.  This vessel measures 2mm in      diameter and is a good-quality target vessel for grafting.  2. The posterior descending coronary artery off the distal right      coronary artery is grafted with a saphenous vein graft in an end-to-      side fashion.  This vessel measures approximately 1.3 mm in      diameter at the site of distal grafting.  It is the largest      terminal branch of the distal right coronary artery, which itself      is somewhat small.  3. The distal left anterior descending coronary artery is grafted with      a saphenous vein graft in an end-to-side fashion.  This vessel      measures 2 mm in diameter and is a good-quality target vessel for      grafting at the site of distal bypass.   Both proximal saphenous vein anastomoses are performed directly to the  ascending aorta prior to removal of the aortic crossclamp.  The left  ventricular septal temperature rises rapidly with reperfusion of the  left internal mammary artery.  One final dose of warm retrograde hot-  shot cardioplegia is administered.  The aortic crossclamp is removed  after a total crossclamp time of 57 minutes.  The heart begins to beat  spontaneously, without  need for cardioversion.  All proximal and distal  coronary anastomoses are inspected for hemostasis and appropriate graft  orientation.  Epicardial pacing wires are fixed to the right ventricular  free wall into the right atrial appendage.  The patient is rewarmed to  37 degrees centigrade temperature.  The patient is weaned from  cardiopulmonary bypass without difficulty.  The patient's rhythm at  separation from bypass is normal sinus rhythm.  Atrial pacing is  employed.  Low-dose dopamine infusion is utilized, and the intra-aortic  balloon counterpulsation was maintained at 1:1.  Total cardiopulmonary  bypass time for the operation is 76 minutes.  Follow-up transesophageal  echocardiogram performed by Dr. Sampson Goon after separation from bypass  demonstrates no significant changes from  preoperatively.   The venous and arterial cannulae are removed uneventfully.  Protamine is  an administered to reverse the anticoagulation.  The mediastinum and the  left chest are irrigated with saline solution containing vancomycin.  Meticulous surgical hemostasis is ascertained.  The mediastinum and the  left chest are drained using three chest tubes exited through separate  stab incisions inferiorly.  The pericardium and soft tissues anterior to  the aorta are reapproximated loosely.  The sternum is closed with double-  strength sternal wire.  The soft tissues anterior to the sternum are  closed in multiple layers, and the skin is closed with a running  subcuticular skin closure.   The patient tolerated the procedure well.  All sponge, instrument, and  needle counts were verified correct at the completion of the operation.  The patient's urine output during cardiopulmonary bypass was relatively  low, and at completion of the operation the patient's Foley catheter was  flushed to make sure it was patent.  The patient was transported the  surgical intensive care unit in stable condition.  The patient  was  transfused 2 units of packed red blood cells during cardiopulmonary  bypass due to anemia which was present prior to surgery and exacerbated  by hemodilution.  The patient was transfused 2 units fresh frozen plasma  and 2 packs adult platelets due to moderate coagulopathy following  reversal of heparin with protamine.      Salvatore Decent. Cornelius Moras, M.D.  Electronically Signed     CHO/MEDQ  D:  08/29/2007  T:  08/29/2007  Job:  [id number]   cc:   Thomas C. Wall, MD, [facility]  Remi Deter. Juanda Chance, MD, [facility]

## 2011-02-11 NOTE — Assessment & Plan Note (Signed)
[facility]                            CARDIOLOGY OFFICE NOTE   NAME:[name], [name]                     MRN:          [id number]  DATE:09/21/2007                            DOB:          1945/10/19    [name] comes in today for follow-up EKG.  His QTC was prolonged and  I stopped his amiodarone.  He is in sinus rhythm.   Repeat EKG shows normal sinus rhythm with his QTC now back to 474 from  over 500.  He has other diffuse changes from his infarcts.   We also obtained blood work today which is pending.  His ALT was up from  17 to 73 and we are just following up on that.  His free T4 and fasting  TSH were normal.   Will see him back again in 4 weeks.     Thomas C. Daleen Squibb, MD, [facility]  Electronically Signed    TCW/MedQ  DD: 09/21/2007  DT: 09/21/2007  Job #: [id number]

## 2011-02-11 NOTE — Assessment & Plan Note (Signed)
[facility]                            CARDIOLOGY OFFICE NOTE   NAME:[name], [name]                     MRN:          [id number]  DATE:08/09/2008                            DOB:          1946-05-19    [name] comes in today for followup.  His biggest concern is that all  his teeth are rotten, and he needs them pulled out.  He has got 16  teeth, and he states he is ashamed even to smile.   He is a year out from his surgery.  He has had no angina or ischemic  symptoms.  He denies any orthopnea or PND.   He is status post defibrillator by Dr. Ladona Ridgel.  It was a dual-chamber  device in July.  He has had followup in October with no discharges.   CURRENT MEDICATIONS:  1. Glucophage 500 b.i.d.  2. Enteric-coated aspirin 325 mg a day.  3. Plavix 75 mg a day.  4. Baclofen 40 mg nightly.  5. Protonix 40 mg a day.  6. Lipitor 80 mg a day.  7. Coreg 12.5 b.i.d.  8. Stool softener.  9. Lisinopril 10 mg a day.  10.Amaryl 4 mg a day.  11.Lasix 20 mg a day.  12.Vitamin B12 subcutaneously q. month.   PHYSICAL EXAMINATION:  VITAL SIGNS:  His blood pressure is 146/82, his  pulse is 66 and regular, and his weight is 205, up 5.  GENERAL:  He is in no acute distress.  SKIN:  Color is good.  HEENT:  Dentition is poor as noted.  NECK:  No JVD.  Carotid upstrokes are equal bilaterally without bruits.  No thyromegaly.  Trachea is midline.  LUNGS:  Clear to auscultation and percussion.  HEART:  Slightly displaced PMI.  There is normal S1 and S2.  No gallop.  ABDOMEN:  Soft.  Good bowel sounds.  No midline bruit.  No hepatomegaly.  EXTREMITIES:  No cyanosis, clubbing, or edema.  Pulses are diminished,  but present, particularly posterior tibials.  There is no DVT.  NEUROLOGIC:  Intact.   EKG shows sinus rhythm, left axis deviation, old inferior wall infarct,  poor R-wave progression across the anterior precordium with T-wave  inversion in the anterior septum  and lateral leads.  No change.   ASSESSMENT AND PLAN:  [name] is stable from my standpoint.  We will  obtain an adenosine rest/stress Myoview.  If there is no ischemia, we  will clear him for dental extraction.  We will have to stop his Plavix  for 5 days with some risk of subacute thrombosis.  He understands this.  He will go back on Plavix after the dental procedure.  I will see him  back in 6 months.     Thomas C. Daleen Squibb, MD, [facility]  Electronically Signed    TCW/MedQ  DD: 08/09/2008  DT: 08/09/2008  Job #: [id number]

## 2011-02-11 NOTE — Consult Note (Signed)
[name], [name] [name]:  [id number]   MEDICAL RECORD [name]:  [id number]          PATIENT TYPE:  INP   LOCATION:  2038                         FACILITY:  [facility]   PHYSICIAN:  Samul Dada, M.D.DATE OF BIRTH:  1946-03-04   DATE OF CONSULTATION:  08/18/2007  DATE OF DISCHARGE:                                 CONSULTATION   REASON FOR CONSULTATION:  Severe anemia.   REFERRING PHYSICIAN:  Dr. Jens Som   HISTORY OF PRESENT ILLNESS:  [name] is a 65 year old white male  asked to see for evaluation of severe anemia.  In review, he was seen at  his primary care physician on August 11, 2007 with a two-month history  of fatigue, dizziness for which he underwent a routine hemoglobin and  hematocrit.  This showed a value of 6.7 and 22.4, respectively.  He was  referred to the emergency department for further evaluation.  Of note,  he was on Plavix and aspirin secondary to a stroke in the past, as well  as for CAD.  Hemoccult was negative.  He did receive transfusion with  initial good response.  On August 13, 2007, he had a GI evaluation and  a colonoscopy, which was essentially negative.  His upper endoscopy was  essentially negative as well.  However, there was suspicion for a  process outside of the luminal GI tract.  He is status post complicated  with anteroseptal ST elevation at the V2-V3 leads, for which he required  a left cardiac catheterization on August 14, 2007.  His hemoglobin was  continuing to be monitored.  He was on Integrilin drip for 18 hours.  His hemoglobin and hematocrit continued to drop, for which we were asked  to see him in order to determine if he has any underlying  hematological  process.  Of note, it is important to mention that he had a CT of the  abdomen and pelvis on August 14, 2007 showing splenomegaly, as well as  nonspecific, small, mesenteric lymph nodes.  His pelvic CT was showing  nonspecific, mesenteric lymph nodes as  well with a tiny amount of free  fluid in the posterior pelvis requiring further evaluation as well.   PAST MEDICAL HISTORY:  1. Recent MI August 11, 2007.  2. Diabetes mellitus type 2.  3. CAD.  4. Anemia, first detected on August 11, 2007 by Dr. Lilian Kapur.  5. History of CVA in the 1990's.  6. Hyperlipidemia.  7. Osteoarthritis.  8. Hypertension.  9. Chronic pain in the right upper extremity after stroke.  10.History of left ICA occlusion.  11.Remote tobacco use.   SURGERIES:  1. Status post cardiac catheterization on August 14, 2007.  2. Status post prior stent placement in the RCA, LAD and circumflex.  3. Status post left TKR in 2006.   ALLERGIES:  CODEINE CAUSES NAUSEA.   CURRENT MEDICATIONS:  1. Ecotrin 325 mg q.d.  2. Lioresal 60 mg q.h.s.  3. Capoten 6.25 mg t.i.d.  4. Coreg 3.125 mg b.i.d.  5. Colace b.i.d.  6. Lasix 40 mg q.d.  7. Heparin per pharmacy.  8. NovoLog t.i.d. as directed.  9. Morphine sulfate p.r.n.  10.Roxicodone p.r.n.  11.OxyContin 40 mg q.12 hours.  12.Percocet p.r.n.  13.Protonix 40 mg q.d.  14.MiraLax b.i.d.  15.Senna q.d.  16.Xanax p.r.n.  17.Valium p.r.n.  18.Zofran p.r.n.   REVIEW OF SYSTEMS:  Remarkable for fatigue, dizziness, dyspnea on  exertion, chronic constipation due to narcotics.  No dysuria or gross  hematuria.  No dysesthesias.  No vision changes or headaches.  No  neurological changes other than the mentioned right upper extremity  residual pain post CVA.  Of note, the patient was on aspirin and Plavix  for CAD and CVA with no other NSAIDs.   FAMILY HISTORY:  Mother alive at 65 with history of CVA and  hypertension; father died with COPD.  He has two sisters and two  brothers all with CAD.  There is no family history of cancer or  irritable bowel syndrome or PUD.   SOCIAL HISTORY:  The patient is married.  He is on disability since his  stroke.  No alcohol history.  He quit 30 years the use of cigarettes.  He  lives in [place].  [employer].   PHYSICAL EXAMINATION:  GENERAL:  This is a well-developed, well-  nourished, 65 year old white male in no acute distress, alert and  oriented x3.  VITAL SIGNS:  Blood pressure 98/53, pulse 64, respirations 20,  temperature 98.4, pulse oximetry 99% on two liters.  HEENT:  Normocephalic, atraumatic.  PERRLA.  Oral mucosa without thrush  or lesions.  Sclerae anicteric.  NECK:  Supple.  No cervical or supraclavicular masses.  LUNGS:  Essentially clear to auscultation bilaterally.  No axillary  masses.  CARDIOVASCULAR:  Regular rate and rhythm without murmurs, rubs or  gallops.  ABDOMEN:  Soft, nontender.  Bowel sounds x4.  No palpable spleen or  liver.  GU/RECTAL:  Deferred.  EXTREMITIES:  No clubbing or cyanosis.  No edema.  SKIN:  Without lesions, bruising or petechiae.  NEURO:  Nonfocal.   LABS:  Hemoglobin 8.7, hematocrit 27.8, white count 9.3, platelets 247,  neutrophils 11.6, MCV 67.9.  PT 12.7, PTT 29, INR 0.9.  Retic count 77.  TSH 1.214.  Iron 12, TIBC 321, percentage saturation 4, ferritin 69, B12  131.  Sodium 136, potassium 4.3, BUN 16, creatinine 0.9, glucose 129.  Total bilirubin 0.7, alkaline phosphatase 65, AST 17, ALT 11, total  protein 6.6, albumin 3.8, calcium 8.5.  IgA 40.   ASSESSMENT/PLAN:  Dr. Arline Asp has seen and evaluated the patient and  the chart has been reviewed.  The smear from today shows changes highly  suggestive of iron deficiency.  The patient had also increased  spherocytes raising the question of hemolytic process.  He has a history  of anemia, which was recently diagnosed.  He has no history of blood  donation or prior transfusion.  He received four units of packed red  blood cells last week.  His stools were apparently negative.  Colonoscopy and endoscopy were done last week, which were negative as  well for bleeding.  He has diverticular disease.  His CT of the abdomen  and pelvis shows splenomegaly.  This  cannot be appreciated by physical  exam.  His labs show low MCV, MCH and elevated RDW.  His vitamin B12 is  135.  His iron studies are consistent with iron deficiency despite a  ferritin of 64.   IMPRESSION:  1. The patient has a high suspicion for iron deficiency of unknown  etiology, questionable gastrointestinal bleed, occult, or impaired      absorption such as with celiac disease, or paroxysmal nocturnal      hemoglobinuria.  IV iron will be considered.  2. Spherocytes.  Will rule out hemolysis.  3. Low vitamin B12 level.  Will check other labs, and will consider      trial of vitamin B12.  4. Splenomegaly per CT, unknown etiology.  This will continue to be      monitored closely.   We will continue to monitor results, and further recommendations will  follow.   Thank you very much for allowing [place] the opportunity to participate in  the care of [name].      Marlowe Kays, P.A.      Samul Dada, M.D.  Electronically Signed    SW/MEDQ  D:  08/19/2007  T:  08/20/2007  Job:  [id number]   cc:   Dr. Lilian Kapur

## 2011-02-11 NOTE — Assessment & Plan Note (Signed)
[facility]                         ELECTROPHYSIOLOGY OFFICE NOTE   NAME:[name], [name]                     MRN:          [id number]  DATE:07/11/2008                            DOB:          June 01, 1946    [name] returns today for followup.  He is a very pleasant 65-year-  old male with a history of an ischemic cardiomyopathy status post  anterior myocardial infarction, history of stroke, history of ICD  implantation who returns today for followup.  He denies chest pain.  He  denies shortness of breath.  He is nervous about his defibrillator and  concerned about going back to walking or exercising.   His medications include:  1. Glucophage 500 twice a day.  2. Aspirin 325 a day.  3. Plavix 75 a day.  4. Baclofen 20 mg 2 tablets daily.  5. Protonix 40 a day.  6. Lipitor 80 a day.  7. Coreg 12.5 twice a day.  8. Lisinopril 10 a day.  9. Amaryl 4 a day.  10.Lasix 20 a day.   PHYSICAL EXAMINATION:  GENERAL:  He is a pleasant, well-appearing 79-  year-old man in no acute distress.  VITAL SIGNS:  The blood pressure was 150/80, the pulse 72 and regular,  the respirations were 18.  Weight was 200 pounds.  NECK:  No jugular distention.  LUNGS:  Clear bilaterally to auscultation.  No wheezes, rales, or  rhonchi are present.  CARDIOVASCULAR:  Regular rate and rhythm.  Normal S1 and S2.  There were  no murmurs, rubs, gallops.  EXTREMITIES:  No edema.   Interrogation of the defibrillator demonstrates a St. Jude current dual-  chamber device.  The P and R waves were 3 and greater than 12,  respectively.  The impedance 350 in the A, 400 in the V.  Threshold 0.5  at 0.5 in the A and 0.75 at 0.5 in the RV.  The battery voltage was  greater than 3.2 V.  He was 80% A paced.  Today, we turned outputs down  2 at 0.5 in the A and 2.5 at 0.5 in the RV.   IMPRESSION:  1. Ischemic cardiomyopathy.  2. 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  3. Status post  dual-chamber implantable cardioverter-defibrillator      insertion.  4. Sinus bradycardia.  5. History of stroke, unclear of the etiology, thought secondary to      peripheral vascular disease.  6. Status post implantable cardioverter-defibrillator insertion      secondary to all of the above.   DISCUSSION:  [name] is stable.  His defibrillator is working  normally.  I have encouraged him to start back on the exercise program,  he walks on a regular basis.  I have asked that he maintain a low-sodium  diet and take his medications as directed.  We will plan to see him back  in the office in July 2010.     Doylene Canning. Ladona Ridgel, MD  Electronically Signed    GWT/MedQ  DD: 07/11/2008  DT: 07/12/2008  Job #: [id number]

## 2011-02-11 NOTE — Op Note (Signed)
[name], RODRIGUE [name]:  [id number]   MEDICAL RECORD [name]:  [id number]          PATIENT TYPE:  INP   LOCATION:  3733                         FACILITY:  [facility]   PHYSICIAN:  Doylene Canning. Ladona Ridgel, MD    DATE OF BIRTH:  Dec 19, 1945   DATE OF PROCEDURE:  04/05/2008  DATE OF DISCHARGE:                               OPERATIVE REPORT   PROCEDURE PERFORMED:  Implantation of a dual-chamber implantable  cardioverter-defibrillator.   INDICATIONS:  Ischemic cardiomyopathy, EF 35%, status post MI with  symptomatic bradycardia.   INTRODUCTION:  The patient is a 65 year old man who was admitted to the  hospital, was scheduled to be admitted for ICD implantation but then  came in with chest pain, underwent catheterization, which demonstrated  no new blockages.  He subsequently underwent 2-D echo as the initial RAO  view of his catheterization suggested an EF of 40%.  Subsequent  2-D  echo demonstrated an EF of 30-35%.  He is now referred for ICD  implantation.   PROCEDURE:  After informed consent was obtained, the patient was taken  to the Diagnostic EP Lab in a fasting state.  After usual preparation  and draping, intravenous fentanyl and midazolam was given for sedation.  Lidocaine 30 mL was infiltrated into the right infraclavicular region.  A 7-cm incision was carried out over this region.  Electrocautery was  utilized to dissect down to the fascial plane.  The right subclavian  vein was punctured x2 and the St. Jude model 7122, 60-cm active fixation  single-coil defibrillation leads serial number [id number] was advanced into  the right ventricle where mapping was carried out.  The final site, R-  waves were 14, the impedance 640 ohms, threshold 0.7 volts at 0.5  milliseconds.  With the ventricular lead in satisfactory position,  attention was then turned to placement of the atrial lead, it was placed  in the anterolateral portion of the right atrium where P-waves measured  3 mV  and the pacing impedance of the lead actively fixed was 533 ohms,  with threshold 0.5 volts at 0.5.  A 10-volt pacing both in the atrium  and ventricle did not stimulate diaphragm and a nice injury current was  noted with each active-fixation.  With these having accomplished, the  leads were secured to subpectoralis fascia with a figure-of-eight silk  suture.  The sewing sleeve was also secured with silk suture.  Electrocautery was utilized to make subcutaneous pocket.  Kanamycin  irrigation was utilized to irrigate the pocket.  Electrocautery was  utilized to assure hemostasis.  The St. Jude current DRRF dual-chamber  ICD, serial number [id number], was connected to the atrial and ventricular  leads and placed back in the subcutaneous pocket and generator secured  with silk suture.  Additional kanamycin was then utilized to irrigate  the pocket.  Defibrillation threshold testing was carried out.   The patient more deeply sedated with fentanyl and Versed, VF was induced  with T-wave shock.  A 25 joules shock was subsequently  delivered which  terminated VF and restored sinus rhythm.  At this point, no additional  defibrillation threshold testing was carried out, the incision closed  with 2-0 Vicryl followed by layer of 3-0 Vicryl.  Benzoin painted on the  skin, Steri-Strips were applied, and pressure dressing was placed.  The  patient returned to his  room in satisfactory condition.  There were no immediate procedure  complications and full results demonstrated successful implantation of a  St. Jude dual-chamber defibrillator in a patient with nonischemic  cardiomyopathy, congestive heart failure class II, sinus bradycardia, EF  30-45%, status post myocardial infarction.      Doylene Canning. Ladona Ridgel, MD  Electronically Signed     GWT/MEDQ  D:  04/05/2008  T:  04/06/2008  Job:  [id number]   cc:   Thomas C. Wall, MD, [facility]

## 2011-02-11 NOTE — Cardiovascular Report (Signed)
[name], BRISK [name]:  [id number]   MEDICAL RECORD [name]:  [id number]          PATIENT TYPE:  INP   LOCATION:  2907                         FACILITY:  [facility]   PHYSICIAN:  Everardo Beals. Juanda Chance, MD, FACCDATE OF BIRTH:  07/23/1946   DATE OF PROCEDURE:  08/23/2007  DATE OF DISCHARGE:                            CARDIAC CATHETERIZATION   CLINICAL HISTORY:  [name] is 65 year old and he has had multiple  percutaneous coronary interventions.  He was admitted a week ago with  stent thrombosis in the LAD and treated with a balloon angioplasty.  He  has multivessel disease and was scheduled for surgery 3 days from now,  but today he developed recurrent chest pain and recurrent anterior ST  elevation.  He was brought to the lab as a code STEMI.   PROCEDURE:  The procedure was initially attempted via the right femoral  artery, but we had difficulty with access and performed the procedure  via the left femoral artery.  A front wall arterial puncture was  performed and Omnipaque contrast was used.  Knowing the patient's  anatomy, we went in with a guiding catheter and found and an FL-4 6-  [place] guiding catheter with side holes was the best fit.  The patient  had been given double-bolus Integrilin and he was given additional  heparin to perform an ACT to greater than 200 seconds.  After completion  of the diagnostic pictures, we proceeded with intervention on the stent  thrombosis in the LAD, which was subtotaled.  We crossed the lesion with  a Prowater wire without difficulty.  We dilated the stent with a 3 x 20-  mm Quantum Maverick, performing 3 inflations up to 16 atmospheres for 30  seconds.  This initially was a 2.5 x 23-mm Cypher stent and postdilated  with a 2.75 balloon.  On the recent recurrent intervention, he was  dilated with a 2.5 Quantum Maverick balloon.  The PTCA gave a good  result.  We noticed that there were collaterals to the right coronary  artery and  felt that there may be progression of disease in the ostium  of the right coronary, so we then performed a diagnostic study of the  right coronary and found there was a 95% ostial stenosis with TIMI II  flow distally.  We felt this represented a second stent thrombosis and  elected to treat this as well.  We used a right saphenous vein bypass  graft guiding catheter, 6-French with side holes, and a PT-2 light  support wire.  We crossed the lesion with a moderate amount of  difficulty.  We predilated with a 2.5 x 15-mm Maverick, performing 2  inflations up to 8 atmospheres for 20 seconds.  We then deployed a 3 x  12-mm Liberte stent, overlapping a previous Cypher stent which ended  just before the ostium; we deployed this with 1 inflation of 14  atmospheres for 20 seconds.  We then postdilated with a 3.25 x 12-mm  Quantum Maverick, performing 2 inflations up to 16 atmospheres for 20  seconds.  Final diagnostic study was then performed  through the guiding  catheter.   The patient's blood pressure was somewhat low during the procedure,  running in the 70s and 80s, and we had started him on dopamine.  We  passed a Swan-Ganz catheter via the left femoral vein at the end of the  procedure.   RESULTS:  The blood pressure was 95/61 with mean of 77.  Left ventricle  pressure was not measured.  Right atrial pressure was 7 mean.  The  pulmonary artery pressure was 26/59 with a mean of 21 and the pulmonary  wedge pressure was 12 mean.   Left main coronary artery :  The left main coronary was free of  significant disease.   Left anterior descending artery:  The left anterior descending artery  gave rise to a septal perforator and 2 diagonal branches.  There was a  long Cypher stent in the mid LAD which had 90% stenosis, fairly focal,  in the proximal to midportion.  There was TIMI-3 flow distally.   Circumflex artery:  The circumflex artery was not evaluated, since this  had been evaluated  recently, but had a 70% mid stenosis and an 80%  distal stenosis and a patent stent the first marginal branch.   Right coronary artery:  The right coronary artery had a 95% stenosis at  the ostium, just at the proximal edge of the previously placed Cypher  stent.  The distal vessel gave rise to a marginal branch which supplied  the inferior septum and 2 posterolateral branches.  There was a patent  stent distal to the marginal branch.   Following PTCA of the lesion of the stent thrombosis in the mid LAD, the  stenosis improved from 90% to 0% and the flow remained TIMI-3 flow.   Following stenting of the lesion at the ostium of the right coronary  artery, the stenosis improved from 95% to 0% and the flow improved from  TIMI-2 to TIMI-3 flow.   CONCLUSION:  1. Acute recurrent anterior wall myocardial infarction with recurrent      stent thrombosis within the Cypher stent in the mid left anterior      descending with 90% obstruction, significant disease in the      circumflex artery as described, and 95% stenosis in the ostium at      the stent site in the proximal right coronary artery felt to      represent a second stent thrombosis with TIMI-2 flow distally.  2. Successful percutaneous transluminal coronary angioplasty of the      stent thrombosis within the stent in the mid left anterior      descending with improvement in the percentage in the area of      narrowing from 90% to 0%.  3. Successful percutaneous coronary intervention of the ostial stent      thrombosis in the right coronary artery using a Liberte bare-metal      stent with improvement in the percentage in the area of narrowing      from 95% to 0%.   DISPOSITION:  The patient was returned to the post-angioplasty unit for  further observation.  He still is critically ill.  He still is at high  risk for recurrent stent thrombosis and I still think surgery represents  his best option.  Since both vessels were open, I am  hopeful that there  was not any major damage from this recurrent infarction.  We were await  the results of his enzymes and will reevaluate  him with an  echocardiogram tomorrow to reevaluate his left ventricular function.      Bruce Elvera Lennox Juanda Chance, MD, [facility]  Electronically Signed     BRB/MEDQ  D:  08/23/2007  T:  08/23/2007  Job:  [id number]   cc:   Madolyn Frieze. Jens Som, MD, [facility]  Bevelyn Buckles. Bensimhon, MD  Kerin Perna, M.D.  [facility]

## 2011-02-11 NOTE — Assessment & Plan Note (Signed)
[facility]                            CARDIOLOGY OFFICE NOTE   NAME:[name], [name] SCHECK                     MRN:          [id number]  DATE:12/03/2007                            DOB:          1946-03-14    [name] comes in today for close followup.   PROBLEM LIST:  1. Coronary artery disease, status post acute anterior wall infarct on      August 14, 2007.      a.     Percutaneous transluminal coronary angioplasty of the left       anterior descending in old stent site.      b.     Status post reocclusion on two separate occasions requiring       percutaneous coronary intervention to the left anterior descending       as well as the ostial right coronary artery.  At this point, he       was in cardiogenic shock.      c.     Status post coronary artery bypass grafting by Dr. Tressie Stalker.  At the time of surgery, he had severe left ventricular       dysfunction with akinesia of the distal anterior wall, apex, and       septum plus hypokinesia of the inferior wall.  He had a left       internal mammary graft placed to the left anterior descending,       vein graft to a first circumflex marginal, vein graft to a       posterior descending vessel.   On last visit, added spironolactone 25 mg a day to accentuate his  diuretic effect and decrease morbidity and mortality based on good  clinical data.   He reports a problem with claustrophobia when he lays down in a bed.  He  does not have specific orthopnea or PND.  He sleeps fine in a recliner.  I think some of this is probably psychological from his prolonged and  very traumatic hospitalization.   We repeated his echocardiogram today.  His left ventricular end-systolic  dimension is upper limits of normal at 56 mm.  End-diastolic is 49.  Fractional shortening is 13%.  His ejection fraction is about 35%.  He  had kinesia of the entire apex.  He had hypokinesia of the entire  anterior septal  wall and severe hypokinesia of the periapical wall.  He  had mild mitral regurgitation.  He has mild elevation in right-sided  pressures.   Compared to his preoperative LV function, this showed slight  improvement.   He would really like to get off of some of his medications.   CURRENT MEDICATIONS:  1. Glucophage 5 mg p.o. b.i.d.  2. Amaryl 4 mg p.o. b.i.d.  3. Aspirin 325 mg day.  4. Plavix 75 mg a day.  5. Baclofen 40 mg q.h.s.  6. Potassium 20 mEq a day.  7. Protonix 40 mg a day.  8. Lipitor 80 mg a day.  9. Lasix 40 mg p.o. daily.  10.Coreg 12.5 mg  p.o. b.i.d.  11.Stool softener.  12.Percocet.  13.Lisinopril 10 mg daily.  14.Spironolactone 25 mg p.o. daily.   LABORATORY DATA:  His last blood work was stable, with a total  cholesterol showing 105, triglycerides 64, HDL up for 35.5 from 27! and  LDL 57 on his Lipitor.  He looked a little bit prerenal.  We increased  his fluid consumption.   We will check his blood work today with his spironolactone addition.  We  may also decrease his furosemide or stop it.   PHYSICAL EXAMINATION:  VITAL SIGNS:  Blood pressure is 139/67, pulse is  58 and regular.  His weight is 206 which is stable.  He looks stronger  and better.  HEENT:  Unchanged.  NECK:  Carotids were equal bilaterally.  There is no JVD.  Thyroid is  not enlarged or tender.  Trachea is midline.  CHEST:  Sternotomy site is stable.  His PMI is displaced.  He has no  obvious S3.  LUNGS:  Clear except for some slight decreased breath sounds in the left  base.  ABDOMEN:  Soft, good bowel sounds.  EXTREMITIES:  No significant edema.  Pulses are intact.  NEUROLOGIC:  Exam is intact.   I had a long talk with [name] today.  Will check a Chem-7.  I should  get the final results of his 2-D echocardiogram.  We may be able to  decrease his furosemide at least to 20 mg a day.   I will plan on seeing him back in about 6-8 weeks for careful followup.     Thomas C.  Daleen Squibb, MD, [facility]  Electronically Signed    TCW/MedQ  DD: 12/03/2007  DT: 12/04/2007  Job #: [id number]   cc:   [name]

## 2011-02-11 NOTE — Group Therapy Note (Signed)
NAMETRANQUILINO, FISCHLER [name]:  [id number]   MEDICAL RECORD [name]:  [id number]          PATIENT TYPE:  INP   LOCATION:  A214                          FACILITY:  [facility]   PHYSICIAN:  Dorris Singh, DO    DATE OF BIRTH:  1946/02/05   DATE OF PROCEDURE:  08/13/2007  DATE OF DISCHARGE:                                 PROGRESS NOTE   The patient seen today, resting comfortably.  He did receive 2 units of  blood.  However, the H&H that was ordered was not completed.  Will go  ahead and order a CBC and CMP for today.  He is scheduled for  colonoscopy and EGD by Dr. Jena Gauss and await any pending results from  that.  I had discussion with patient and family regarding causes of  anemia including possible ulcers and cancer.   PHYSICAL EXAMINATION:  VITAL SIGNS:  97.1 temperature,  respirations 20,  blood pressure 131/67.  GENERAL:  This is a 65 year old male who is well-developed, well-  nourished, in no acute distress.  HEART:  Regular rate and rhythm.  LUNGS:  Clear to auscultation bilaterally.  ABDOMEN:  Soft, nontender, nondistended.  EXTREMITIES:  Positive pulses, no ecchymoses, cyanosis or edema noted.  SKIN:  Improved color and texture.   LABORATORY DATA:  CBC was done.  Hemoglobin 10.9, hematocrit 34.9.  Pending CMP.   ASSESSMENT/PLAN:  1. Severe anemia.  2. Diabetes.  3. Coronary artery disease.   PLAN:  He is scheduled for a colonoscopy and EGD.  Await pending results  for procedure.  Diabetes is currently stable.  Coronary artery disease  is stable.  Will await any changes based on findings from his procedures  today.      Dorris Singh, DO  Electronically Signed     CB/MEDQ  D:  08/13/2007  T:  08/13/2007  Job:  [id number]

## 2011-02-11 NOTE — Assessment & Plan Note (Signed)
[facility]                            CARDIOLOGY OFFICE NOTE   NAME:[name], [name]                     MRN:          [id number]  DATE:02/18/2008                            DOB:          31-Mar-1946    PROBLEM LIST:  1. Coronary artery disease.  Status post acute anterior wall infarct      on August 14, 2007.  Percutaneous transluminal coronary      angioplasty of the left anterior descending in an old stent site.      He then subsequently reoccluded on 2 separate occasions requiring      percutaneous coronary intervention of the left anterior descending      as well as an ostial right coronary lesion.  2. Status post cardiogenic shock from the above.  3. Left ventricular systolic dysfunction, ejection fraction 30-35% by      last echo on March 2009.  4. Status post coronary artery bypass grafting by Dr. Tressie [name].      Please see previous notes for details.  5. History of Spironolactone-induced hyperkalemia, which had to be      discontinued.  6. Peripheral vascular disease.  7. History of a stroke.   See discharge summary for other problems.   He still sleeps in a recliner because of claustrophobia.  He denies any  true orthopnea or PND.  He has had no tachy-palpitations, syncope, or  presyncope.  He has had no angina.  He does have some dyspnea on  exertion.   His medicines were unchanged except I deleted his spironolactone after  discovering hyperkalemia.  We did drop his Lasix from 40 mg to 20 mg a  day, which he has tolerated beautifully.   He is also diabetic.   PHYSICAL EXAMINATION:  VITAL SIGNS:  Today, his blood pressure is  124/65.  His pulse is 58 and regular.  His weight is down 4 pounds to  202.  HEENT:  Unchanged since the last exam.  NECK:  Supple.  Carotids are full without bruits.  Thyroid is not  enlarged.  Trachea is midline.  There is no JVD.  LUNGS:  Clear to auscultation.  HEART:  Slightly displaced PMI.   There is no S3 gallop.  There is a soft  systolic murmur.  ABDOMEN:  Soft.  Good bowel sounds.  No midline bruit.  No hepatomegaly.  No organomegaly.  EXTREMITIES:  1+ pretibial edema.  He has a prominent sock line present.  He did not take his Lasix this morning.  His pulses are diminished being  1+ /4+ bilaterally symmetrical.  His toes are slightly cool.  He has a  late capillary refill.  There is no sign of DVT.  NEURO:  Intact and his baseline.   ASSESSMENT/PLAN:  [name] is doing well.  Unfortunately, he could not  tolerate his spironolactone.   I have had a long talk with him and his wife today about a  defibrillator.  I am going to set him up to see Dr. Lewayne [name] for  more information.  I have recommended that I would have  it done if I  were him assuming Dr. Ladona [name] thinks this is doable.   I will plan to see him back in November 2009.     [name] C. Daleen Squibb, MD, [facility]  Electronically Signed    TCW/MedQ  DD: 02/18/2008  DT: 02/18/2008  Job #: [id number]   cc:   [name]

## 2011-02-11 NOTE — H&P (Signed)
[name], [name] [name]:  [id number]   MEDICAL RECORD [name]:  [id number]          PATIENT TYPE:  INP   LOCATION:  3733                         FACILITY:  [facility]   PHYSICIAN:  [name] Pick. Eden Emms, MD, FACCDATE OF BIRTH:  11-04-1945   DATE OF ADMISSION:  04/04/2008  DATE OF DISCHARGE:                              HISTORY & PHYSICAL   PRIMARY CARDIOLOGIST:  [name] Fus C. Daleen Squibb, MD, [facility]   ELECTROPHYSIOLOGIST:  [name] Canning. Ladona Ridgel, MD   PRIMARY CARE Aland Chestnutt:  [name] in [place].   PATIENT PROFILE:  This is a 65 year old married Caucasian male with  history of CAD status post multiple PCIs, MIs, and recent CABG x3 in  November 2008 who presents with unstable angina.   PROBLEMS:  1. Unstable angina/coronary artery disease.      a.     January 10, 2005, successful percutaneous coronary       intervention and stenting of the circumflex and mid left anterior       descending with Cypher drug-eluting stents.  The distal left       anterior descending was balloon angioplastied.      b.     January 13, 2005, percutaneous coronary intervention and       Cypher drug-eluting stent placement x2 to the ostial and mid right       carotid artery.      c.     August 14, 2007, anterior ST-segment elevation myocardial       infarction/percutaneous coronary intervention.  The left anterior       descending was 100% and this underwent balloon angioplasty.  Right       carotid artery was 95%.      d.     August 29, 2007, coronary artery bypass graft x3.  Left       internal mammary artery to the left anterior descending, vein       graft to the obtuse marginal 1, and vein graft to the posterior       descending artery.  2. 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      a.     December 03, 2007, ejection fraction 35% with anteroseptal and       periapical hypokinesis.  Mild mitral regurgitation.      b.     Pending implantable cardioverter-defibrillator  placement       with [name].  3. Peripheral vascular disease with history of left internal carotid      artery occlusion.  Status post right carotid endarterectomy.  4. History of cerebrovascular accident in 52s.  5. Anemia.  6. Diabetes mellitus.  7. Osteoarthritis.  8. Hypertension.  9. Chronic renal insufficiency.  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  11.Remote tobacco abuse.  12.Sinus bradycardia, which is asymptomatic.   HISTORY OF PRESENT ILLNESS:  This is a 65 year old married Caucasian  male with history of CAD, status post CABG x3 in November 2008, who was  in his usual state of health until today at 12 noon when he developed  7/10 substernal chest heaviness and  tightness that occurred at rest,  associated with nausea, and similar to previous angina.  After  approximately 40 minutes while his wife was driving in here to the ED,  he took a sublingual nitroglycerin in the car with complete relief of  discomfort within 2-3 minutes.  He is now pain free.  ECG shows no acute  changes and cardiac markers were negative x1.   ALLERGIES:  SPIROLACTONE causes hyperkalemia.   HOME MEDICATIONS:  1. Plavix 75 mg daily.  2. Aspirin 325 mg daily.  3. Glucophage 500 mg b.i.d.  4. Amaryl 4 mg b.i.d.  5. Lipitor 80 mg daily.  6. Potassium chloride 20 mEq daily.  7. Coreg 12.5 mg b.i.d.  8. Protonix 40 mg daily.  9. Lisinopril 10 mg daily.  10.Baclofen 40 mg nightly.   FAMILY HISTORY:  Mother died at age 73 of CVA, MI, and diabetes.  Father  died at 41 black lung and MI.  He has 2 sisters, 1 has diabetes.  He has  a brother who has a history of CAD and CABG.   SOCIAL HISTORY:  He lives in [place] with his wife.  He is retired  and been on disability since his stroke in the 1990s.  He had a 15-pack-  year history of tobacco abuse, quitting about 20 years ago.  He denies  any alcohol or drugs and is not routinely exercising.   REVIEW OF SYSTEMS:   Positive for chest pain and nausea as outlined in  the HPI.  He also has some degree of chronic constipation.  Otherwise,  all systems are reviewed and negative.  He is a full code.   PHYSICAL EXAMINATION:  VITAL SIGNS:  Temperature 97.5, heart rate 52,  respirations 20, blood pressure 138/68, and pulse ox 96% on room air.  GENERAL:  Pleasant white male in no acute distress.  Awake, alert, and  oriented x3.  HEENT:  Normal with the exception of poor dentition.  NEUROLOGIC:  Grossly intact and nonfocal.  SKIN:  Warm and dry with well-healed midsternal and right neck  incisions.  NECK:  Soft right carotid bruit.  No JVD.  LUNGS:  Respirations were unlabored.  Clear to auscultation.  CARDIAC:  Regular S1 and S2.  No S3,  S4, or murmurs.  ABDOMEN:  Round, soft, nontender, and nondistended.  Bowel sounds  present x4.  EXTREMITIES:  Warm, dry, and pink.  No clubbing, cyanosis, or edema.  Dorsalis pedis and posterior tibial pulses are 2+ and equal bilaterally.   Chest x-ray shows interval clearing of left pleural effusions consistent  of cardiomegaly and bibasilar atelectasis.  EKG shows sinus bradycardia  at a rate of 50.  The inferior Q-waves as well as poor R-wave  progression from V1 through V6 indicative of previous anterolateral MI.  He has T-wave inversion of V3 through V6, which is not new.   LAB WORK:  Hemoglobin 13.3, hematocrit 41.0, WBC 28, and platelets  143,000.  Sodium 137, potassium 4.9, chloride 106, CO2 of 21, BUN 31,  creatinine 1.5, and glucose 157.  CK-MB 1.2, and troponin I less than  0.05.  INR 1.0.   ASSESSMENT AND PLAN:  1. Unstable angina/coronary artery disease, status post multiple      myocardial infarctions, percutaneous coronary interventions, and      coronary artery bypass graft x3 in November 2008.  He had been      doing well, but had chest pain today similar to previous angina and  responsive to Nitro.  He is now pain free.  Plan to admit and  cycle      cardiac markers.  Continue home medications and add heparin and      Nitro.  Plan catheterization in the a.m.  He will also require a      hydration.  2. 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      He is euvolemic.  Schedule for outpatient implantable cardioverter-      defibrillator tomorrow.  We will have EP known about admission and      plan to push him back to tomorrow afternoon, so that we may perform      cardiac catheterization first to rule out ischemic lesions.      Provided that coronaries are okay, he may undergo implantable      cardioverter-defibrillator placement tomorrow.  Continue beta-      blocker.  We are going to hold off ACE inhibitor in the setting of      renal insufficiency pending catheterization.  Plan to resume post      catheterization.  3. Hyperlipidemia.  Check lipids and LFTs.  Continue statin.  4. Diabetes mellitus.  Hold Glucophage and sliding-scale insulin.  5. Hypertension, stable.  6. Chronic renal insufficiency, hydrate pre-catheterization.  Hold      ACE.      Nicolasa Ducking, ANP      [name] Pick. Eden Emms, MD, [facility]  Electronically Signed    CB/MEDQ  D:  04/04/2008  T:  04/05/2008  Job:  [id number]

## 2011-02-11 NOTE — Assessment & Plan Note (Signed)
[facility]                                 ON-CALL NOTE   NAME:FARMERDavieon, [name]                       MRN:          [id number]  DATE:12/23/2008                            DOB:          12-08-1945    I was contacted on Saturday in the late morning on the December 23, 2008,  by [name] regarding his symptoms of shortness of breath and slight  increase in weight and lower extremity edema over the last several days.  The patient informed me that he already takes one Lasix a day, but had  not increased his dose.  He reported no severe impediment to his daily  activities due to his symptomology; however, he was concerned as to the  best plan moving forward, increasing Lasix dose versus following up  first thing Monday versus coming to the ED.  I informed the patient that  he should double his dose of Lasix to 2 tablets a day and monitor his  symptoms.  If he continued to significantly worsen over the weekend, it  may be necessary for him to present to the emergency department or at  least call and discuss his symptoms with [place] again.  However, I told him  that I would set up a followup appointment either with Dr. Daleen Squibb, his  primary cardiologist, or at the [facility] next week.  However, it may  be necessary to make an appointment to see Jacolyn Reedy if no other  providers are available next week.  The patient indicated that he  understood these instructions and at the end of conversation, he had no  questions or concerns that I had not been addressed.     Jarrett Ables, [facility]     MS/MedQ  DD: 12/23/2008  DT: 12/24/2008  Job #: [id number]

## 2011-02-11 NOTE — Assessment & Plan Note (Signed)
OFFICE VISIT   GAY, RAPE  DOB:  1946-02-05                                        September 27, 2007  CHART #:  [id number]   HISTORY OF PRESENT ILLNESS:  Mr. Nila returns for a routine follow-up  status post coronary artery bypass grafting x 3 on 08/29/07.  Despite  his preoperative in hospital course with multiple myocardial infarctions  and his previous history of numerous medical problems, Mr. Prehn has  recovered postoperatively uneventfully.  He did have some transient  paroxysmal atrial fibrillation during his early postoperative  convalescence and he was initially sent home on amiodarone.  This has  since been discontinued by Dr. Daleen Squibb.  Mr. Maldonado continues to recover  uneventfully.  He returns to the office for a routine follow-up today.  He has minimal soreness in his chest and he is no longer taking any pain  medicines with respect to his chest, but he does have chronic pain  related to his previous stroke.  He reports that he is eating well and  his appetite is good.  He has not had any shortness of breath.  He  denies any fevers, chills or productive cough. He has not had any tachy  palpitations or dizzy spells.  Home health nurses have stopped coming to  see him, although the home health physical therapist continued to work  with him to improve his activity.  Overall he is getting along  reasonably well.  He reports that his blood sugars have been under  reasonably good control with fasting a.m. blood sugars averaging in the  120s and evening blood sugars before supper ranging between 150 and 170.  His medications remain unchanged from the time of hospital discharge  with the exception of the fact that he is no longer taking amiodarone.   PHYSICAL EXAMINATION:  GENERAL:  Notable for a well-nourished male who  appears somewhat older and stated age but is getting along reasonably  well and is in no distress.  VITALS:  Blood pressure  is 147/76, pulse 70, oxygen saturations 96% on  room air.  CHEST:  Examination of the chest is notable for median sternotomy  incision that is healing nicely.  This sternum is stable on palpation.  Breath sounds are clear to auscultation and symmetrical bilaterally with  perhaps slightly diminished breath sounds at the left lung base.  No  wheezes or rhonchi are noted.  ABDOMEN:  Soft, nontender.  EXTREMITIES:  Warm and well-perfused.  The small incision from  endoscopic vein harvest is healed well in the right thigh.  There is no  lower extremity edema.   DIAGNOSTIC TEST:  Chest x-ray obtained today at the [facility] is reviewed.  This demonstrates clear lung fields bilaterally  with a very tiny left pleural effusion and an associated left lower lobe  atelectasis.  No other abnormalities are noted.  All of the sternal  wires appear intact.  There is no pulmonary edema.   IMPRESSION:  Satisfactory progress following coronary artery bypass  grafting.  Overall Mr. Seeling appears to be getting along reasonably  well under the circumstances.   PLAN:  I have encouraged Mr. Nehme to continue to work on increasing  his physical activity and strengthen stamina with the physical  therapist.  I expect that before too  long he will be discharged from the  home health physical therapy and at that point he can transfer into the  cardiac rehab outpatient program.   I have encouraged him to continue to increase his activity with his only  limitation remaining that he refrain from heavy lifting or strenuous use  of his arms or shoulders for at least another 2 months.  We have not  made any adjustments in his current medications.  At some point it would  be reasonable to obtain a follow-up echocardiogram to re-evaluate his  left ventricular function.  If left ventricular function remains  profoundly severe, the question as to whether or not a defibrillator  should be placed can be  addressed.  We will leave this up to the  discretion of Dr. Daleen Squibb and colleagues at the [facility] office.  In the future Mr. Marchetta will call and return to see [place] here at [facility] only should further problems or  difficulties arise.   Salvatore Decent. Cornelius Moras, M.D.  Electronically Signed   CHO/MEDQ  D:  09/27/2007  T:  09/27/2007  Job:  [id number]   cc:   Thomas C. Wall, MD, [facility]  Remi Deter. Juanda Chance, MD, [facility]

## 2011-02-11 NOTE — Assessment & Plan Note (Signed)
[facility]                                 ON-CALL NOTE   [name], Guitron NIGUEL [name]                     MRN:          [id number]  DATE:04/08/2008                            DOB:          01/28/1946    PRIMARY CARDIOLOGIST:  Doylene Canning. Ladona Ridgel, MD.  Jesse Sans. Wall, MD, [facility].   BRIEF HISTORY:  [name] is a 65 year old white male who was  discharged on the 9th.  He calls today stating that he has been doing  fine.  However, his wife noticed that his defibrillator site was swollen  approximately in half an inch.  He states that there is no bruising.  He  denies erythema, warmth, drainage, or fever.  He has been following  activity restrictions.   I advised [name] that the only way we could look at it throughout  the weekend, will be for him to come to the emergency room.  However, I  stated that at this point, it sounded like his pacemaker site seemed to  be okay and may be just having some post-procedure tissue swelling.  I  asked him to take a major blocker and draw a small line around the area  of the swelling.  If he notice it, it becomes larger than what he  describes or if he develops draining, erythema, or a fever, he needs to  call us back and we will absolutely have to evaluate it in the emergency  room for the weekend.  I also instructed him if it remained stable  throughout the weekend, he could keep his scheduled appointment with Dr.  Ladona Ridgel in followup.  He was agreeable with this plan.      Joellyn Rued, PA-C  Electronically Signed      Jesse Sans. Daleen Squibb, MD, [facility]  Electronically Signed   EW/MedQ  DD: 04/08/2008  DT: 04/08/2008  Job #: [id number]

## 2011-02-11 NOTE — Group Therapy Note (Signed)
[name], MARLATT [name]:  [id number]   MEDICAL RECORD [name]:  [id number]          PATIENT TYPE:  INP   LOCATION:  A214                          FACILITY:  [facility]   PHYSICIAN:  Dorris Singh, DO    DATE OF BIRTH:  05/17/1946   DATE OF PROCEDURE:  DATE OF DISCHARGE:                                 PROGRESS NOTE   SUBJECTIVE:  The patient is seen today after receiving 2 units of blood;  his current H&H are 8/26.1, feeling better.  GI saw him today and will  schedule him for a possible EGD and a colonoscopy tomorrow.  We will go  ahead and transfuse the patient 2 more units of blood in the meantime.   OBJECTIVE:  VITAL SIGNS:  Temperature 98.1, pulse 62, respirations 22,  blood pressure 141/62.  GENERAL:  This is a 65 year old Caucasian male  who is well-developed and well-nourished, in no acute distress.  HEART:  Regular rate and rhythm.  LUNGS:  Clear to auscultation bilaterally.  ABDOMEN:  Soft, nontender and non-distended.  LEGS:  Positive pulses.  SKIN:  Positive pallor.   His labs are as follows:  His WBC is 7.1, hemoglobin 8, hematocrit 26.1  and platelet count 252,000.  His electrolytes are within normal range.  His glucose is 68 today.  We will go ahead and make sure that he has a  CBC and a CMP for tomorrow.   ASSESSMENT AND PLAN:  1. Severe anemia.  2. Diabetes.  3. Coronary artery disease.   We will plan to monitor the patient and we will continue with current  plans as mentioned above and we will continue to monitor his diabetes as  well as his blood and his laboratory values and make appropriate changes  as necessary, await pending testing tomorrow.      Dorris Singh, DO  Electronically Signed     CB/MEDQ  D:  08/12/2007  T:  08/12/2007  Job:  [id number]

## 2011-02-11 NOTE — Discharge Summary (Signed)
NAMECAELUM, FEDERICI [name]:  [id number]   MEDICAL RECORD [name]:  [id number]          PATIENT TYPE:  INP   LOCATION:  2027                         FACILITY:  [facility]   PHYSICIAN:  Salvatore Decent. Cornelius Moras, M.D. DATE OF BIRTH:  Sep 26, 1946   DATE OF ADMISSION:  08/14/2007  DATE OF DISCHARGE:  09/04/2007                               DISCHARGE SUMMARY   HISTORY OF PRESENT ILLNESS:  The patient is a 65 year old male with a  known history of coronary artery disease as well as multiple risk  factors that include hypertension, type 2 diabetes mellitus,  hyperlipidemia and previous stroke.  The patient's cardiac history dates  back several years at which time he suffered an acute non-ST-segment  elevation myocardial infarction.  He was found to have severe three-  vessel coronary artery disease and was treated with multivessel  percutaneous coronary intervention and stenting including the left  anterior descending coronary artery and the right coronary artery.  He  initially did well.  Recently, he was hospitalized at [facility] with severe iron deficiency anemia.  Patient apparently has no  history of grossly heme-positive stools.  A GI evaluation including  upper and lower endoscopy was done and this showed some diverticulosis,  but no signs of ongoing bleeding.  There was no definitive source of the  bleeding identified.  The patient also had a CT scan, but the results  are not available.  Within a couple of hours of being discharged from  the hospital, he developed a severe onset of substernal chest pressure  radiating across the chest with shortness of breath.  He was evaluated  in the emergency room and was ruled in for acute ST-segment elevation  myocardial infarction.  He was felt to require admission for further  evaluation and treatment.  This would include urgent cardiac  catheterization.   PAST MEDICAL HISTORY:  Includes:  1. Coronary artery disease.  2.  Diabetes mellitus type 2.  3. Anemia as described above.  4. History of a CVA in the 1990's.  5. History of hyperlipidemia.  6. Osteoarthritis.  7. Hypertension.  8. Chronic pain in the right upper extremity after stroke.  9. History of left internal carotid artery occlusion.  10.History of remote tobacco abuse.   PAST SURGERIES:  Include a left total knee replacement in 2003.   ALLERGIES:  CODEINE CAUSES NAUSEA.   MEDICATIONS PRIOR TO ADMISSION:  Include:  1. Baclofen 60 mg at bedtime.  2. Metformin 500 mg twice daily.  3. Plavix 75 mg daily.  4. Metoprolol 25 mg daily.  5. Enalapril 10 mg daily.  6. OxyContin 40 mg three times daily.  7. Percocet 5/325 mg four times daily.   FAMILY HISTORY/SOCIAL HISTORY/REVIEW OF SYMPTOMS/PHYSICAL EXAMINATION:  Please see the history and physical done at the time of admission.   HOSPITAL COURSE:  The patient was admitted.  He was taken to the cardiac  catheterization laboratory by Dr. Delfin Edis.  He was found to have  acute thrombosis of the proximal left anterior descending coronary  artery at the site of previous stent  placement.  He was also noted to  have severe three-vessel coronary artery disease and severe left  ventricular dysfunction.  He was treated with balloon angioplasty of the  culprit vessel with successful resolution of the ongoing ischemia.  He  was then stabilized medically and further testing including a 2-D  echocardiogram was done and this revealed severe left ventricular  dysfunction with akinesis involving the distal anterior wall and septum.  Ejection fraction was estimated at 30%.  Patient also required a  consultation by the GI team as well as the hematology team.  He was felt  to have a chronic iron deficiency anemia, presumably with a history of  GI blood loss, but no sign of ongoing bleeding.  The patient initially  remained stable medically and considerable debate was undertaken about  the appropriate  subsequent therapy to be done, initially with plans for  repeat PCI.  Ultimately a cardiac surgical consultation was requested.  The patient was felt to be somewhat a high risk, but satisfactory  candidate for surgery despite severe left ventricular dysfunction  related to patient's recent acute ST-segment elevation myocardial  infarction and his other comorbid medical conditions.  However, prior to  elective surgical revascularization the patient suffered two  additionalacute myocardial infarctions in the setting of transient  sessation of all anticoagulation agents, each requiring emergent balloon  angioplasty.  The first time, balloon angioplasty and stenting of the  right coronary artery was performed as well and the second time both the  right coronary artery and the left anterior descending coronary artery  had thrombosed.  This was complicated by cardiogenic shock and  respiratory failure requiring intubation, mechanical ventilation and  placement of an intraaortic balloon pump.  The patient subsequently  recovered and remained fairly stable although he did continue to have a  gradual slow decline of hemoglobin and hematocrit while receiving the  intravenous Integrilin and heparin concerning for the possibility of  acute blood loss.  Given the history of recurrent acute thrombosis with  three recent myocardial infarctions, urgent surgical revascularization  was felt to be the best course of therapy and all blood thinners were  stopped at least temporarily.  The patient and his family were counseled  given the nature of the high-risk surgical intervention under the  circumstances, but after this discussion they did agree to proceed with  the surgery.   PROCEDURE:  On August 29, 2007, the patient was taken to the operating  room at which time he underwent the following procedure:  Urgent  coronary artery bypass grafting x3.  The following grafts were placed:  Left internal mammary  artery to the distal LAD coronary artery,  saphenous vein graft to the first circumflex marginal branch, saphenous  vein graft to the posterior descending coronary artery.  Patient  tolerated the procedure well and was returned to the surgical intensive  care unit in stable condition.   OPERATIVE FINDINGS:  Included the following:  1. Severe left ventricular dysfunction with akinesis of the distal      anterior wall, apex, and septum, plus hypokinesis of the inferior      wall.  2. Good quality left internal mammary artery and saphenous vein      conduit for grafting.  3. Good quality target vessels for grafting with the exception of the      distal right coronary circulation where the posterior descending      coronary artery was somewhat small in caliber, but nonetheless free  of disease at the site of distal grafting.   The patient was taken to the surgical intensive care unit in stable  condition.   POSTOPERATIVE HOSPITAL COURSE:  Patient has done quite well.  Initially,  he did require low-dose inotropic support as well as ongoing use of the  intraaortic balloon pump.  These were weaned without significant  difficulty.  He has had significant volume overload and required  aggressive ongoing diuresis which will need to be continued as an  outpatient.  His hemoglobin and hematocrit appeared to be stable.  He  does have continued anemia.  Most recent hemoglobin and hematocrit dated  September 02, 2007 were 9.6 and 29.1, respectively.  Electrolytes, BUN and  creatinine are stable although he has required some potassium  supplementation.  The patient has shown a gradual increase in his  activity level.  His diabetes has been under adequate control and he is  being converted back to his oral home medical regimen.  His incisions  are healing well without signs of infection.  He is tolerating diet.  His oxygen has been weaned and he maintains good saturations on room  air.  He did  have an episode of paroxysmal atrial fibrillation.  He has  subsequently been chemically cardioverted to a normal sinus rhythm with  amiodarone.  He was treated additionally postoperatively with a course  of antibiotics.  This was for the possibility of hospital-acquired  pneumonia.  All cultures were negative and the antibiotics were  discontinued.  Overall, the patient is felt to be tentatively stable for  discharge in the morning of December 5 pending morning-round  reevaluation.   INSTRUCTIONS:  Patient will receive written instructions in regard to  medication, activity, diet, wound care and followup.   FOLLOWUP:  Will include:  1. Dr. Cornelius Moras on September 27, 2007 at 2:00 p.m.  2. He is also instructed to follow up with his cardiologist, Dr. Juanito Doom, in two weeks.   MEDICATIONS AT DISCHARGE:  Will include the following:  1. Aspirin 325 mg daily.  2. Coreg 12.5 mg twice daily.  3. Lisinopril 10 mg twice daily.  4. Lipitor 80 mg daily.  5. Protonix 40 mg daily.  6. Lasix 40 mg daily.  7. K-Dur 20 mEq daily.  8. Plavix 75 mg daily.  9. Amiodarone 400 mg every eight hours for five days and then 400 mg      daily.   FINAL DIAGNOSES:  Include the following:  1. Severe three-vessel coronary artery disease status post acute ST-      segment elevation myocardial infarction x3 with severe ischemic      cardiomyopathy.  2. Preoperative cardiogenic shock.  3. Preoperative intraaortic balloon pump placement.  4. Preoperative ventilatory-dependent respiratory failure.   OTHER DIAGNOSES:  Include:  1. Iron deficiency anemia of uncertain etiology.  Previous plans were      for a capsule colonoscopy as an outpatient.  Currently, anemia is      stable.  2. Diabetes mellitus type 2.  3. History of cerebrovascular accident in the 1990's.  4. History of hyperlipidemia.  5. Osteoarthritis.  6. Hypertension.  7. Chronic pain in the right upper extremity.  8. History of left internal  carotid artery occlusion.  9. Remote history of tobacco abuse.  10.History of left total knee replacement.  11.Postoperative paroxysmal atrial fibrillation.      Rowe Clack, P.A.-C.      Salvatore Decent. Cornelius Moras,  M.D.  Electronically Signed    WEG/MEDQ  D:  09/03/2007  T:  09/03/2007  Job:  [id number]   cc:   Solon Palm Daleen Squibb, MD, [facility]  Samul Dada, M.D.

## 2011-02-11 NOTE — Cardiovascular Report (Signed)
[name], DOME [name]:  [id number]   MEDICAL RECORD [name]:  [id number]           PATIENT TYPE:   LOCATION:                                 FACILITY:   PHYSICIAN:  Veverly Fells. Excell Seltzer, MD  DATE OF BIRTH:  [birth year]   DATE OF PROCEDURE:  08/26/2007  DATE OF DISCHARGE:                            CARDIAC CATHETERIZATION   PROCEDURE:  1. Left heart catheterization.  2. Selective coronary angiography.  3. Left ventricular angiography.  4. PTCA of the LAD.  5. IVUS of the LAD.  6. PTCA of the right coronary artery.  7. Intra-aortic balloon pump insertion.   INDICATION:  [name] is an unfortunate 65 year old gentleman who has  had recurrent LAD stent thrombosis.  He initially presented with marked  anemia and was taken off Plavix.  He had an anterior MI secondary to  stent thrombosis.  He has had a recurrent episode of stent thrombosis  and was treated by Dr. Juanda Chance.  He also had a stent placed in his  proximal right coronary artery at that time.  This evening he developed  severe substernal chest pain and cardiogenic shock.  He required  intubation.  His EKG showed pronounced anterolateral ST elevation.  This  occurred off of antiplatelet therapy.  The patient had a retroperitoneal  bleed approximately 48 hours ago requiring blood transfusion and his  anti coagulant and antiplatelet therapy was discontinued due to his  significant bleed.  After reviewing the case carefully with Dr. Daleen Squibb as  well as Dr. Cornelius Moras with [facility], we elected to bring the patient emergently  for cardiac catheterization and probable angioplasty.  The increased  risks were reviewed in detail with the patient's family members  including his wife.   Risks and indications of procedure were reviewed with the patient's  family.  Informed consent was obtained.  The left groin was prepped,  draped, anesthetized with 1% lidocaine using modified Seldinger  technique.  A 6-French sheath was placed in  the left femoral vein and a  6-French sheath was placed in the left femoral artery.  A 6-French XB  LAD 3.5 cm guide catheter was inserted and initial angiography  demonstrated total LAD occlusion at the proximal edge of the stent.  Heparin and Integrilin were used for anticoagulation.  The patient was  not treated with any Plavix due to anticipated multivessel coronary  bypass in the next 48-72 hours.  Once a therapeutic ACT was achieved, a  cougar guidewire was passed beyond the occlusion into the distal LAD.  The vessel was predilated with a 2.5 x 20-mm Maverick balloon to 10  atmospheres.  It was then dilated with a 3-0 x 15-mm Quantum Maverick  balloon to 20 atmospheres on consecutive inflations.  Following balloon  dilatation there was restoration of flow and wide patency of the stent.  At that point I elected to perform intravascular ultrasound to evaluate  for malapposition or any other mechanical problem that may not be  apparent with angiography.  Intracoronary nitroglycerin was given and  the IVUS catheter was passed beyond the stented segment.  An  automated  pullback was performed.  The IVUS demonstrated well apposed stent  throughout.  There was one area in the mid distal portion of the long  stented segment that raised suspicion of possible thrombus, but there  was not luminal compromise present.  At the proximal edge of the stent  there was an irregular area that had an appearance consistent with  thrombus as well.  At that point I elected to dilate the stent one more  time with a 3.25 x 15-mm Quantum Maverick up to 18 atmospheres.  I then  pulled the balloon back just proximal to the stent edge and dilated it  to 14 atmospheres.  At the completion of the LAD intervention, there was  a widely patent stent with TIMI III flow throughout the LAD.   Following the LAD intervention, a diagnostic JR-4 catheter was inserted  in the right coronary artery and initial angiography  demonstrated  thrombus throughout the stented segment in the proximal right coronary  artery.  I changed out for a JR-4 guide catheter with side holes.  A  cougar guidewire was passed beyond the area of thrombus, but I was  unable to pass any balloons over that wire.  I suspected that it was  through a stent strut.  A second wire was placed and I was able to  easily pass the 2.5 x 12-mm Maverick balloon into the stented segment  and it was dilated on multiple inflations to 14 atmospheres.  I then  took a 3.25 x 15-mm Quantum Maverick balloon up to 18 atmospheres on  three consecutive inflations to cover the stented segment.  Prior to  balloon dilatation there was 90% luminal compromise with heavy thrombus  burden.  At the completion of the procedure, the stent was widely  patent.  Just beyond the stented segment there was some haziness that I  suspected was residual thrombus, but it did not appear to be  significantly obstructing the lumen.  There was also some downstream  thrombus, but there was TIMI III flow and I thought the patient would  not benefit from further balloon dilatations.  At that point an angled  pigtail catheter was placed in the left ventricle and a ventriculogram  was performed.  A pullback across the aortic valve was done.  At the  completion of the procedure, an intra-aortic balloon pump was inserted  in the left femoral artery.  This was changed out over a standard J wire  from the sheath that was in place.  The patient tolerated the procedure  well and there were no immediate complications.   FINDINGS:  The left mainstem has a 40% stenosis in the proximal aspect.  It bifurcates into the LAD and left circumflex.   The LAD is occluded at the proximal edge of the stent in the midportion.  There is a first diagonal branch that arises from this area that is a  medium size caliber.  There is an 80-90% proximal stenosis of that  vessel.   The left circumflex is widely  patent.  There is a stent in the first OM  branch.  There is a moderate stenosis in the midportion of the  circumflex.  The appearance of the left circumflex is unchanged from the  previous study.   The right coronary artery is patent.  The stented segment in the  proximal portion of the right coronary artery has a heavy thrombus  burden with a 90% luminal obstruction.  The PDA  is patent.  There is an  acute marginal branch that is occluded.   Left ventriculography demonstrates anteroapical and inferoapical  akinesis and dyskinesis respectively.  The basal segments of the LV are  normal.  The LVEF is estimated at 25-30%.   ASSESSMENT:  1. Anterior myocardial infarction secondary to LAD stent thrombosis.  2. Right coronary artery stent thrombosis.  3. Successful PCI of the LAD and right coronary artery with balloon      angioplasty.  4. Severe left ventricular systolic dysfunction.  5. Intra-aortic balloon pump placement.   PLAN:  This is obviously a very difficult situation and [name]. Bushong is  critically ill.  He is improved after PCI.  His heart rate has gone from  115 at the beginning of the case down to 85 and his blood pressure has  stabilized.  We will continue with intra-aortic balloon pump support as  well as mechanical ventilation overnight.  We will continue Integrilin  and low-dose heparin and watch him closely for recurrent retroperitoneal  bleed.  If he re-bleeds, he will  need to obviously be supported with blood products.  The risk of  stopping his anticoagulation again is obviously very high as he has had  recurrent episodes of stent thrombosis.  The case was reviewed with Dr.  Cornelius Moras who will follow-up with [name]. Eves and likely proceed with surgery  as soon as he stabilizes.      Veverly Fells. Excell Seltzer, MD  Electronically Signed     MDC/MEDQ  D:  08/26/2007  T:  08/26/2007  Job:  [id number]

## 2011-02-11 NOTE — Cardiovascular Report (Signed)
NAMENYZAIAH, KAI [name]:  [id number]   MEDICAL RECORD [name]:  [id number]          PATIENT TYPE:  INP   LOCATION:  2910                         FACILITY:  [facility]   PHYSICIAN:  Colleen Can. Deborah Chalk, M.D.DATE OF BIRTH:  June 29, 1946   DATE OF PROCEDURE:  08/14/2007  DATE OF DISCHARGE:                            CARDIAC CATHETERIZATION   HISTORY:  Mr. Bures is a 65 year old male with diabetes, hypertension  with previous three-vessel Cypher stent placement in 2006, by Dr. Charlies Constable.  He has been on Plavix up to this point and had a CVA many years  ago.  He was discharged from [facility] 2 hours ago.  He was  hospital there on August 11, 2007, after a visit to his primary care  showed that his hemoglobin was approximately 6.  He was transfused 4  units of blood.  Upper GI colonoscopy were basically negative by  history.  There was no clear-cut history of GI blood loss that I can  ascertain from history.  He had a negative evaluation and had a CT scan  with contrast this morning.  He developed substernal chest pain  approximately 2 hours before presenting to the emergency room compatible  with an anterior septal STEMI.  He had ST elevation of V2-V3.   PROCEDURES:  Left heart catheterization with selective coronary  angiography, left ventricular angiography with angioplasty of the left  anterior descending artery because of in-stent thrombosis.   ACCESS:  Percutaneous right femoral artery with Angio-Seal.   CATHETERS:  6-French, four curved Judkins right and left coronary  catheters;  6-French pigtail ventriculographic catheter; 6-French, JR-4  guide, Prowater guidewire;  2.5 x 20-mm, Maverick balloon, subsequent  2.5 x 20-mm, Quantum Maverick balloon.   CONTRAST:  Omnipaque.   MEDICATIONS GIVEN PRIOR TO PROCEDURE:  Versed and fentanyl.   MEDICATION GIVEN DURING PROCEDURE:  Integrilin, heparin and IV  nitroglycerin with Ancef post study.   COMMENTS:   The patient tolerated the procedure reasonably well.  He had  mild residual chest pain after the procedure.   HEMODYNAMIC DATA:  The aortic pressure was 119/64, LV was 132/10-33.  There was no aortic valve gradient noted on pullback.   ANGIOGRAPHIC DATA:  1. Left main coronary artery had a 30-40% ostial stenosis.  2. Left anterior descending had a totally occluded stent with stent      thrombosis.  It is abrupt thrombosis and occlusion.  There is a      small diagonal just proximal to the stent with a 90% ostial      stenosis.  3. Left circumflex had a large obtuse marginal with a stent in place.      There is a 60% narrowing prior to that stent.  There is diffuse 30-      60% stenosis in the distal left circumflex.  4. Right coronary artery.  The right coronary artery had a severe 95%      ostial stenosis before the stent.  There were three stents in the      right coronary artery with 60-70% prior to the third  stent just      after the acute margin.  There was collaterals to the distal left      anterior descending.   Left ventricular angiogram with a hand injection.  There was anterior  apical akinesis.  Left ventricular end-diastolic pressure was 32.   Angioplasty procedure:  We used a JL-4, 6-French guide.  The Prowater  guidewire was shaped and was finally passed into the occluded stent.  Reflow was obtained at the point of stent passing.  We initially dilated  with a Maverick balloon and then a 2.5 x 20-mm, Quantum Maverick to 20  atmospheres.  The total occlusion was reduced to no residual stenosis  with TIMI-3 flow.  Anticoagulation remained satisfactory throughout the  case.   We debated the option of intervention on the proximal right coronary  artery because of the severity of the ostial stenosis.  The patient  gradually improved and became more stable.  In light of the complexity  of the nature as well as the previous probable GI blood loss, although  certainly there  is a great deal and uncertainty as to the cause of the  patient's severe anemia and evaluation earlier this week, we elected to  Angio-Seal and close the arteriotomy site in the right groin and  electively address that at another time.  It may be that bypass surgery  will be the best option for the patient.  The patient does need a  hematologic evaluation.  There is still certainly the concern about  ongoing GI blood loss and will carefully monitor hemoglobin and  hematocrit as we continue him on Integrilin.  We will not restart Plavix  because of the concern about the need for open heart surgery.      Colleen Can. Deborah Chalk, M.D.  Electronically Signed     SNT/MEDQ  D:  08/14/2007  T:  08/16/2007  Job:  [id number]

## 2011-02-11 NOTE — Assessment & Plan Note (Signed)
[facility]                         ELECTROPHYSIOLOGY OFFICE NOTE   NAME:[name], [name]                     MRN:          [id number]  DATE:03/28/2008                            DOB:          [birth year]    HISTORY OF PRESENT ILLNESS:  Mr. Weckerly is referred today by Dr. Juanito Doom for evaluation and consideration for prophylactic ICD implantation.  The patient is a very pleasant middle-aged man with longstanding  diabetes and dyslipidemia and tobacco use.  He had a remote stroke.  He  sustained large anterior myocardial infarction back in October 2008  requiring redo intervention.  Apparently, he had been on Plavix and had  stopped this.  His stent had clotted off.  Ultimately, he ended up  undergoing bypass surgery.  Repeat echo has demonstrated large anterior  wall scar with EF of 30-35%.  His heart failure is class I to II on  maximal medical therapy with all of the above.   MEDICATIONS:  1. Glucophage 500 twice a day.  2. Aspirin 325 a day.  3. Plavix 75 a day.  4. Protonix 40 a day.  5. Lipitor 80 a day.  6. Coreg 12.5 twice daily.  7. Lisinopril 10 a day.  8. Amaryl 4 mg a day.  9. Lasix 20 a day.  10.Multiple vitamins.   FAMILY HISTORY:  Notable for coronary disease.   SOCIAL HISTORY:  The patient is married.  He has a history of tobacco  use and abuse.   SOCIAL HISTORY:  The patient denies tobacco abuse.  He is married.  Otherwise, he has been stable.   REVIEW OF SYSTEMS:  Notable for some mild memory loss.  He has some  difficulty with speech from his remote stroke.  He has some weakness in  his arms.   PHYSICAL EXAMINATION:  GENERAL:  He is a pleasant middle-aged man in no  acute distress.  VITALS:  Blood pressure was 118/80.  The pulse 59 and regular, the  respirations 18, the weight was not recorded.  HEENT:  Normocephalic and atraumatic.  Pupils are equal and round.  The  oropharynx moist.  Sclerae anicteric.  NECK:   Revealed no jugular distention and no thyromegaly.  Trachea was  midline.  Carotid 2+ and symmetric.  LUNGS:  Clear to bilateral auscultation.  No wheezes, rales or rhonchi  are present.  There is no increased work of breathing.  CARDIOVASCULAR:  Regular rate and normal with normal S1 and S2.  PMI was  enlarged and mildly displaced, could not appreciate an S3 or an S4  gallop.  ABDOMEN:  Soft, nontender, nondistended.  There is no organomegaly.  The  bowel sounds are present, no rebound or guarding.  EXTREMITIES:  No  cyanosis, clubbing or edema.  The pulses are 2+ and symmetric.  NEUROLOGIC:  Alert and oriented.  Cranial nerves are intact.  Strength  was 5 on the right, 4/5 on the left.   EKG demonstrates sinus rhythm with left axis deviation and a large  anterior MI.   IMPRESSION:  1. Ischemic cardiomyopathy.  2. Congestive heart failure class  I to II with ejection fraction 30-      35%.  3. Remote stroke.   DISCUSSION:  I discussed the treatment options with the patient in  detail.  The risks, benefits, goals, and expectations of prophylactic  ICD and stent implantation were discussed and he would like to proceed.  He will call us to schedule this.  We will plan on a dual-chamber device  secondary to his sinus bradycardia.      Doylene Canning. Ladona Ridgel, MD  Electronically Signed     Doylene Canning. Ladona Ridgel, MD  Electronically Signed   GWT/MedQ  DD: 03/28/2008  DT: 03/29/2008  Job #: [id number]

## 2011-02-11 NOTE — Op Note (Signed)
[name], [name]              ACCOUNT [name]:  [id number]   MEDICAL RECORD [name]:  [id number]          PATIENT TYPE:  INP   LOCATION:  A214                          FACILITY:  [facility]   PHYSICIAN:  R. Roetta Sessions, M.D. DATE OF BIRTH:  Dec 02, 1945   DATE OF PROCEDURE:  DATE OF DISCHARGE:  08/14/2007                               OPERATIVE REPORT   INDICATIONS FOR PROCEDURE:  The patient is a 61-year gentleman admitted  to the hospital with profound iron-deficiency anemia.  No obvious GI  bleeding.  Colonoscopy impossible; EGD is now being done.  Potential  risks, benefits, and alternatives have been reviewed, questions  answered.  He is agreeable.  Please send documentation to the medical  record.   PROCEDURE NOTE:  O2 saturation, blood pressure, pulse monitored  throughout the entirety of both procedures.  Conscious sedation with  Versed 8 mg IV, Demerol 200 mg IV in divided doses.  Cetacaine spray for  topical pharyngeal anesthesia.   INSTRUMENT:  Pentax video chip system.   FINDINGS:  Digital rectal exam revealed no abnormalities.  The prep was  adequate.  Colon:  Colonic mucosa was surveyed from the rectosigmoid  junction to the left transverse right colon to the appendiceal orifice,  ileocecal valve, and cecum.  These structures well-seen photographed for  the record.  Terminal ileum was admitted to 5 cm from this level.  The  scope was slowly and cautiously withdrawn.  All previous mentioned  mucosal surfaces were well-seen using a combination of tip flexion and  fold flattening.  The mucosa of the colon was well-seen.  The patient  had a left-sided diverticulum.  There was no neoplasm, polyp, or other  abnormality to explain IDA.  The terminal ileum mucosa appeared grossly  normal but it would not insufflate very well.  The scope was then pulled  down into the rectum where thorough examination of the rectal mucosa,  including retroflexed view of the anal verge, demonstrated  no  abnormalities.  The patient tolerated the procedure well and was  prepared for EGD.  Cetacaine spray for topical pharyngeal anesthesia was  employed.   FINDINGS:  Examination of the tubular esophagus revealed no mucosal  abnormalities.  The EGD junction was easily traversed.  The stomach,  colon, and the gastric mucosa appeared somewhat atrophic but otherwise  normal.  There was a small hiatal hernia.  No infiltrating process or  ulcer was seen.  It is, however, notable that the stomach would not  insufflate very well, in suggestion of extrinsic compression.  The  pylorus was patent and easily traversed.  Examination of the bulb and  second and third portions appeared normal.  Biopsies of D2 and D3 were  taken for histology.  The patient tolerated both procedures was reactive  after   IMPRESSION:  1. Colonoscopy:  Normal rectum.  2. Left-sided diverticulum.  Colonic mucosa appeared normal.  Terminal      ileum mucosa appeared normal, but question poor insufflation      colonoscopy.  3. EGD findings:  Normal esophagus.  Small hiatal hernia.  Somewhat  atrophic gastric mucosa.  Poor distensibility of stomach--question      extrinsic compression.  Patent pylorus.  Normal D1 through D3.      Biopsies of D2, D3.   I am somewhat suspicious.  There may be a process outside the luminal GI  tract contributing to the clinical picture.   RECOMMENDATIONS:  Will proceed with abdominal pelvic CT with IV normal  contrast.  If that study is negative, then he will need a Gibbon small  bowel capsule to complete imaging of his GI tract.      Jonathon Bellows, M.D.  Electronically Signed     RMR/MEDQ  D:  08/13/2007  T:  08/14/2007  Job:  [id number]   cc:   [facility], P Team

## 2011-02-11 NOTE — Assessment & Plan Note (Signed)
[facility]                                 ON-CALL NOTE   [name], [name]                     MRN:          [id number]  DATE:12/08/2007                            DOB:          [birth year]    PRIMARY CARDIOLOGIST:  Jesse Sans. Daleen Squibb, MD, [facility]   PRIMARY CARE Abubakr Wieman:  Dr. [name]   I received a call  from [name] this evening at 5:56 p.m.  I  called back and [name] was stating that he feels a little bit  lightheaded but his primary complaint is that his ears feel congested  and are popping, and he is wondering if he can get something for that.  I advised that without actually examining him, it would be difficult to  determine why his ears are popping as the differential is fairly broad,  and recommended that he follow up with his primary care [name] as  there may be no specific therapy indicated.     Nicolasa Ducking, ANP  Electronically Signed    CB/MedQ  DD: 12/08/2007  DT: 12/09/2007  Job #: [id number]

## 2011-02-11 NOTE — H&P (Addendum)
NAME:  [name], [name] [name]:  [id number]   MEDICAL RECORD [name]:  [id number]           PATIENT TYPE:   LOCATION:                                 FACILITY:   PHYSICIAN:  Satira Anis, MD     DATE OF BIRTH:   DATE OF ADMISSION:  DATE OF DISCHARGE:                              HISTORY & PHYSICAL   CHIEF COMPLAINT:  The patient was transported to the cardiac  catheterization lab with chest pain and ST elevation on a 12-lead  electrocardiogram.   HISTORY OF PRESENT ILLNESS:  The patient is a 65 year old Caucasian male  who has a history of diabetes; coronary artery disease with prior stent  placements in the right coronary artery and left anterior descending  artery, as well as left circumflex artery.  He also has a history of  anemia with a hemoglobin of 6.7 as detected on a followup examination on  August 11, 2007 by Dr. Skeet Latch.  He was given 2 units of  packed red blood cells and has been discharged thereafter.  The patient  claims to be compliant with Plavix therapy.   He was taken to the cardiac catheterization laboratory by Dr. Deborah Chalk  and thrombus was noted in the proximal stent in the left anterior  descending artery.  A PTCA with stent implantation was performed and  there was TIMI-3 flow distally.  The patient still had some residual  chest pain and was relieved by an IV nitroglycerin drip.  The patient  was transported to the coronary care unit for further cardiopulmonary  care.   PAST MEDICAL HISTORY:  1. Coronary artery disease, as stated above.  2. CVA 10 years ago.  3. Hyperlipidemia.  4. Type 2 diabetes mellitus.  5. Osteoarthritis.  6. Chronic hypertension.   PAST SURGICAL HISTORY:  Left total knee replacement.   FAMILY HISTORY:  Positive for diabetes; coronary artery disease.   SOCIAL HISTORY:  Nonsmoker.  No drinking, no alcohol abuse.   ALLERGIES:  CODEINE.   HOME MEDICATIONS:  1. Baclofen 60 mg at bedtime.  2.  Metformin 500 mg twice per day.  3. Plavix 75 mg daily.  4. Metoprolol 25 mg daily.  5. ____ QA MARKER: 160 ____mg twi  6. Enalapril 10 mg daily.  7. OxyContin 40 mg 3 times per day.  8. Percocet 5/325 mg 4 times per day.   REVIEW OF SYSTEMS:  Not obtained because of the patient's acute  condition with myocardial infarction.   PHYSICAL EXAMINATION:  VITAL SIGNS:  Blood pressure is 140/85, pulse  rate is 77 and regular, respiratory rate is 12, temperature is 98.  GENERAL APPEARANCE:  Awake and alert and recovering from residual  sedative effect given in the catheterization lab.  HEAD:  Atraumatic, normocephalic.  NECK:  Soft, supple.  Nontender, nondistended.  CARDIOVASCULAR:  Soft systolic murmur.  S1, S2 regular.  No murmurs or  gallops.  LUNGS:  Clear to auscultation.  ABDOMEN:  Soft, nondistended.  EXTREMITIES:  No edema.  Normal pulses.  NEUROLOGIC:  Neurologic examination cannot be obtained because of the  patient being  under sedative effects.   LABORATORY INVESTIGATIONS:  Sodium 135, potassium 3.5.  Hematocrit 34%.   Electrocardiogram shows inferior Q waves which are probably old, and  anterolateral ST-T changes suggestive of an acute myocardial infarction  with loss of anteroseptal forces.   PLAN:  1. Status post angioplasty with PTCA to the left anterior descending      artery:  Continue Integrilin and dual antiplatelet therapy.  2. Monitor hemoglobin with his previous history of anemia and continue      Integrilin drip for 18 hours.      Satira Anis, MD  Electronically Signed     RN/MEDQ  D:  08/14/2007  T:  08/14/2007  Job:  [id number]

## 2011-02-11 NOTE — Consult Note (Signed)
NAMESTEPHENSON, [name] [name]:  [id number]   MEDICAL RECORD [name]:  [id number]          PATIENT TYPE:  INP   LOCATION:  2038                         FACILITY:  [facility]   PHYSICIAN:  Salvatore Decent. Cornelius Moras, M.D. DATE OF BIRTH:  [birth year]   DATE OF CONSULTATION:  08/20/2007  DATE OF DISCHARGE:                                 CONSULTATION   REQUESTING PHYSICIAN:  Dr. Nicholes Mango   PRIMARY CARDIOLOGIST:  Dr. Juanito Doom   PRIMARY CARE PHYSICIAN:  Dr. [name]   REASON FOR CONSULTATION:  Severe three-vessel coronary artery disease  status post acute ST-segment-elevation myocardial infarction.   HISTORY OF PRESENT ILLNESS:  [name] is a 65 year old white male from  [name] with known history of coronary artery disease, hypertension,  type 2 diabetes mellitus, hyperlipidemia, and previous stroke.  The  patient's cardiac history dates back several years at which time he  suffered an acute non-ST-segment-elevation myocardial infarction.  He  was found to have severe three-vessel coronary artery disease.  He was  treated with multivessel percutaneous coronary intervention and stenting  including the left anterior descending coronary artery and the right  coronary artery.  He initially did well.  Recently he was hospitalized  at [facility] in [place] with severe iron-deficient anemia.  The patient apparently has no known history of grossly heme-positive  stool.  He underwent a fairly thorough GI workup at that time including  upper and lower endoscopy.  He was noted to have some diverticulosis but  no sign of ongoing bleeding.  There was no definitive source of bleeding  identified.  The patient also apparently had a CT scan of the abdomen  done; the results of that test are not currently known.  Within a couple  hours after being discharged from hospital, [name] developed severe  onset of substernal chest pressure radiating across the chest with  shortness of breath.  He was evaluated in the emergency room and  diagnosed with acute ST-segment-elevation myocardial infarction.  He is  taking a cardiac catheterization laboratory by Dr. Delfin Edis.  He was  found to have acute thrombosis of the proximal left anterior descending  coronary artery at the site of previous stent placement.  He was also  noted to have severe three-vessel coronary artery disease and severe  left ventricular dysfunction.  He was treated with balloon angioplasty  of the culprit vessel with successful resolution of ongoing ischemia.  Since then, the patient has remained stable from a cardiac standpoint.  He underwent a 2-D echocardiogram demonstrating severe left ventricular  dysfunction with akinesis involving the distal anterior wall and septum.  Ejection fraction is estimated 30%.  The patient has also been seen in  consultation by the GI team as well as the hematology team.  The patient  is felt to have chronic iron-deficient anemia, presumably with history  of GI blood loss, but no sign of ongoing bleeding.  Hemoglobin and  hematocrit have remained reasonably stable despite ongoing  anticoagulation.  Cardiothoracic surgical consultation has now been  requested to consider surgical revascularization for more definitive  treatment of his coronary artery disease.   REVIEW OF SYSTEMS:  GENERAL:  The patient reports normal appetite.  He  has not been gaining or losing weight recently.  CARDIAC:  Notable for  the absence of any previous history of substernal chest pressure, chest  tightness, or chest pain prior to that which developed at the time of  his emergent presentation with his acute myocardial infarction.  Since  hospital admission and his angioplasty he has not had any further  episodes of chest pain.  He denies significant exertional shortness of  breath.  He denies PND, orthopnea, palpitations, or syncope.  He has  occasional mild lower extremity  edema.  He has occasional dizzy spells.  RESPIRATORY:  Notable for a cough that is intermittently productive of  small amount of yellowish sputum.  He denies hemoptysis and wheezing.  GASTROINTESTINAL:  Negative.  The patient reports no difficulty  swallowing.  He has some problems with chronic constipation but this is  stable.  He denies any history of hematochezia, hematemesis, melena.  He  has been heme negative on rectal exam during this hospitalization.  MUSCULOSKELETAL:  Notable for chronic pain in his right side for which  the patient remains narcotic dependent.  The patient also has some mild  arthritis.  NEUROLOGIC:  Notable for chronic mild weakness and numbness  involving the entire right side of his body.  This dates back to a  previous stroke.  GENITOURINARY:  Negative.  HEENT:  Negative.  INFECTIOUS:  Negative.   PAST MEDICAL HISTORY:  1. Coronary artery disease.  2. Hypertension.  3. Hyperlipidemia.  4. Type 2 diabetes mellitus.  5. Severe iron-deficient anemia.  6. Cerebrovascular disease status post left hemispheric stroke 18      years ago.  7. Chronic pain in right upper and right lower leg.  8. Osteoarthritis.   PAST SURGICAL HISTORY:  Left total knee replacement.   SOCIAL HISTORY:  The patient is disabled due his previous stroke and  lives with his wife in [place].  He remains ambulatory and still  drives an automobile and is able to tend to most of his basic needs.  He  is a nonsmoker.  He denies alcohol consumption.  They have two grown  children, one of whom lives next-door.   MEDICATIONS PRIOR TO ADMISSION:  Include Plavix, baclofen, metformin,  metoprolol, enalapril, OxyContin, Percocet, Amaryl.   DRUG ALLERGIES:  CODEINE causes nausea.   PHYSICAL EXAMINATION:  The patient is a thin white male who appears his  stated age in no acute distress.  He is currently afebrile and  normotensive.  HEENT EXAM:  Grossly unrevealing.  NECK:  Supple.  There is  no cervical or supraclavicular lymphadenopathy.  There is no jugular venous distention.  No carotid bruits noted.  Auscultation of the chest demonstrates clear breath sounds which are  symmetrical bilaterally.  No wheezes or rhonchi noted.  CARDIOVASCULAR EXAM:  Notable for regular rate and rhythm.  No murmurs,  rubs or gallops are appreciated.  ABDOMEN:  Soft, nontender.  There are no palpable masses.  Bowel sounds  are present.  EXTREMITIES:  Warm and adequately perfused.  There is no lower extremity  edema.  Distal pulses are palpable in the posterior tibial position on  both sides.  There is no sign of significant venous insufficiency.  The  skin is clean, dry, healthy-appearing throughout.  RECTAL AND GU EXAMS:  Both deferred.  NEUROLOGIC EXAMINATION:  Grossly nonfocal with exception of  mild  weakness involving the right side of the body.   DIAGNOSTIC TESTS:  Cardiac catheterization performed by Dr. Deborah Chalk is  reviewed.  This was also compared with a previous catheterization  performed by Dr. Juanda Chance in April 2006.  The most recent catheterization  demonstrates 100% proximal occlusion of the left anterior descending  coronary artery at the site of previous stent placement.  This is just  after takeoff of a small diagonal branch.  There is 95% proximal  stenosis of the diagonal branch.  There is 60-70% proximal stenosis of a  large first circumflex marginal branch with 80% stenosis of the mid left  circumflex coronary artery after takeoff of the circumflex marginal  branch and before a medium-sized second circumflex marginal branch which  has 70% proximal stenosis.  There is codominant coronary circulation.  There is 90% ostial right coronary artery stenosis.  There is 70-80%  stenosis of the distal right coronary artery just before the distal  stent.  The entire distal portion of the vessel is filled with a stent  and likely would not be graftable.  There is one acute marginal  branch  that arises before this which might be a graftable target, but there is  potential for inadequate appropriate target vessels for grafting in the  right coronary territory.  There is severe left ventricular dysfunction  with ejection fraction estimated 30-35%.   IMPRESSION:  Severe three-vessel coronary artery disease with ischemic  cardiomyopathy, status post acute ST-segment-elevation myocardial  infarction involving acute stent thrombosis in the proximal left  anterior descending coronary artery.  This has been temporized by  successful balloon angioplasty of this vessel and Mr. Pappalardo has done  well with medical treatment since then.  He has significant residual  disease in all vascular territories as well as the left anterior  descending coronary artery.  The distal left anterior descending  coronary artery does appear to have an appropriate distal target site  for grafting, as do the branches of the left circumflex system.  The  right coronary artery is diffusely diseased, and there may not be a very  good target site for grafting in this territory.  Nonetheless, I suspect  that Mr. Graveline would best be treated with surgical revascularization.  His risks of surgery will certainly be elevated due to his numerous  comorbid medical problems.   PLAN:  I have discussed the options at length with Mr. Alexa this  afternoon.  Alternative treatment strategies have been discussed.  He  understands and accepts all potential associated risks of surgery  including but not limited to risk of death, stroke, myocardial  infarction, congestive heart failure, respiratory failure, pneumonia,  bleeding requiring blood transfusion, arrhythmia, infection, recurrent  coronary artery disease.  All of his questions have been addressed.  We  tentatively plan to proceed with surgery next week on Tuesday, November  25.      Salvatore Decent. Cornelius Moras, M.D.  Electronically Signed     CHO/MEDQ  D:   08/20/2007  T:  08/21/2007  Job:  [id number]   cc:   Thomas C. Daleen Squibb, MD, [facility]  [name]

## 2011-02-11 NOTE — Discharge Summary (Signed)
[name], [name] [name]:  [id number]   MEDICAL RECORD [name]:  [id number]          PATIENT TYPE:  INP   LOCATION:  3733                         FACILITY:  [facility]   PHYSICIAN:  [name] Sans. Wall, MD, FACCDATE OF BIRTH:  1945/11/23   DATE OF ADMISSION:  04/04/2008  DATE OF DISCHARGE:  04/06/2008                               DISCHARGE SUMMARY   PRIMARY CARDIOLOGIST:  [name] Fus C. Wall, MD, [facility].   PRIMARY ELECTROPHYSIOLOGIST:  [name] Canning. Ladona Ridgel, MD.   PRIMARY CARE [name]:  [name].   DISCHARGE DIAGNOSIS:  Chest pain.   SECONDARY DIAGNOSES:  1. Coronary artery disease, status post catheterization this admission      revealing 3/3 patent grafts.  2. Ischemic cardiomyopathy/chronic systolic congestive heart failure,      status post St. Jude Current DR RF 2207-36, dual-chamber, automatic      implantable cardioverter-defibrillator this admission.  3. Peripheral vascular disease with known left internal carotid artery      occlusion status post right cerebrovascular accident.  4. History of cerebrovascular accident in 65.  5. Anemia.  6. Type 2 diabetes mellitus.  7. Osteoarthritis.  8. Hypertension.  9. Chronic right upper extremity pain.  10.Remote tobacco abuse.  11.History of sinus bradycardia.   ALLERGIES:  SPIRONOLACTONE causes hyperkalemia.   PROCEDURES:  Left heart cardiac catheterization, 2-D echocardiogram,  successful placement of St. Jude's AICD.   HISTORY OF PRESENT ILLNESS:  A 65 year old married Caucasian male with  history of CAD status post multiple MIs, PCIs, and CABG x3 in November  2008.  He was in his usual state of heath until April 04, 2008, when he  developed 7/10 substernal chest heaviness and tightness with associated  nausea similar to previous angina.  He took a nitroglycerin after  approximately 40 minutes of symptoms with relief in 2-3 minutes.  He  presented to the [facility] ED where ECG showed no acute changes and  cardiac  markers were negative.  He was admitted for further evaluation  and management of chest pain.   HOSPITAL COURSE:  [name] had mild elevation in his troponin to 0.14  with normal CKs and MBs.  He underwent left heart cardiac  catheterization on the morning of April 05, 2008, revealing patency of the  LIMA to the LAD, vein graft to the obtuse marginal, and vein graft to  the PDA.  He had native multivessel disease.  The EF by left  ventriculography was 35%-40% with anteroapical [name] motion abnormality.  A 2-D echocardiogram was subsequently performed post catheterization  revealing EF of 35%, which was similar to his December 03, 2007, echo.  At  this point, the patient was seen by [name] as he had  previously been scheduled for outpatient ICD placement on April 05, 2008.  Decision was made to place the ICD on April 05, 2008, and a St. Jude  current DR RF 2207-36, [id number], dual-chamber AICD was successfully  placed.  The patient tolerated this procedure well.  Post procedure  interrogation revealed normal function.  Postoperative chest x-ray  showed no evidence of pneumothorax.  [name] has been ambulating  without difficulty, will be discharged home today in good condition.   DISCHARGE LABS:  Hemoglobin 13.0, hematocrit 38.9, WBC 6.8, platelets  124, and INR 1.1.  D-dimer 0.61.  Sodium 137, potassium 4.2, chloride  102, CO2 of 26, BUN 11, creatinine 0.94, and glucose 106.  Total  bilirubin 0.6, alkaline phosphatase 49, AST 14, ALT 8, total protein  5.9, albumin 3.6, and calcium 8.9.  CK 80, MB 4.2, and troponin I 0.14.  Total cholesterol 81, triglycerides 69, HDL 20, and LDL 41.  TSH 1.198.   DISPOSITION:  The patient has been discharged home today in good  condition.   FOLLOWUP PLANS AND APPOINTMENT:  He is to follow up with [facility] on April 19, 2008, at 9:20 a.m.  Follow up with  [name] on July 11, 2008, at 11:10 a.m.  He is asked to   follow up with [name] and [name] as previously scheduled.   DISCHARGE MEDICATIONS:  1. Aspirin 325 mg daily.  2. Plavix 75 mg daily.  3. Glucophage 500 mg b.i.d.  4. Amaryl 4 mg b.i.d.  5. Lipitor 80 mg nightly.  6. K-Dur 20 mEq daily.  7. Coreg 12.5 mg b.i.d.  8. Protonix 40 mg daily.  9. Lisinopril 10 mg daily.  10.Baclofen 40 mg nightly.  11.Percocet 10/325 mg 1 tablet q.4 h. p.r.n.  12.Nitroglycerin 0.4 mg sublingual p.r.n. chest pain.   OUTSTANDING LAB STUDIES:  None.   DURATION OF DISCHARGE/ENCOUNTER:  Sixty minutes including physician  time.      [name], ANP      [name] Sans. Daleen Squibb, MD, [facility]  Electronically Signed    CB/MEDQ  D:  04/06/2008  T:  04/07/2008  Job:  [id number]   cc:   [name]

## 2011-02-11 NOTE — Consult Note (Signed)
[name], [name]              ACCOUNT [name]:  [id number]   MEDICAL RECORD [name]:  [id number]          PATIENT TYPE:  INP   LOCATION:  A214                          FACILITY:  [facility]   PHYSICIAN:  R. Roetta Sessions, M.D. DATE OF BIRTH:  June 06, 1946   DATE OF CONSULTATION:  08/12/2007  DATE OF DISCHARGE:                                 CONSULTATION   REASON FOR CONSULTATION:  Anemia.   PRIMARY CARE PHYSICIAN:  [name], M.D.   HISTORY OF PRESENT ILLNESS:  Patient is a 65 year old Caucasian  gentleman who presented to the emergency department for further  evaluation of profound microcytic anemia.  He states he has his blood  work checked every three months and on this past check, he was called by  Dr. Charm Barges and told to come to the emergency department because of  severe anemia. His hemoglobin was in the 6 range as an outpatient.  Upon  evaluation, he was noted to have a hemoglobin of 6.7, his MCV was 60.7.  He is on Plavix and aspirin chronically given history of stroke, CAD.  He denies any blood in the stool.  He states his stools are dark.  Denies any melena.  He says he has chronic constipation ever since he  has been on narcotics for pain related to his stroke.  He also takes  Colace daily and sometimes over-the-counter laxatives.  He denies any  abdominal pain, nausea or vomiting, heartburn, dysphagia or odynophagia,  weight loss.  He takes aspirin 325 mg daily, no other NSAIDs.  He has  never had a colonoscopy.  He states he has just been putting it off.  He  denies any family history of colorectal cancer, IBD or peptic ulcer  disease.   HOME MEDICATIONS:  1. Baclofen 60 mg nightly.  2. Metformin 500 mg b.i.d.  3. Plavix 75 mg daily.  4. Metoprolol 25 mg daily.  5. Glimepiride 4 mg b.i.d.  6. Enalapril 10 mg daily.  7. OxyContin 40 mg t.i.d.  8. Percocet 5/325 mg q.i.d.  9. Stool softener b.i.d.  10.Aspirin 325 mg daily.  11.Over-the-counter laxatives as needed.   ALLERGIES:  NO KNOWN DRUG ALLERGIES.   PAST MEDICAL HISTORY:  1. Diabetes mellitus.  2. History of CAD, status post MI a couple of years ago.  He has also      had coronary stenting.  3. He has history of stroke in the 1990s.  4. Status post saphenous vein graft to the right coronary artery.  5. He has left ICA occlusion.  6. Hyperlipidemia.  7. Hypertension.  8. Left knee replacement more than a year ago.   FAMILY HISTORY:  As above.   SOCIAL HISTORY:  He is married with two children.  He is on disability  secondary to a stroke.  He quit smoking over 30 years ago.  Denies  alcohol use.   REVIEW OF SYSTEMS:  See HPI for GI, constitutional.  CARDIOPULMONARY:  Denies chest pain or shortness of breath.   PHYSICAL EXAMINATION:  VITAL SIGNS:  Temperature 98.1, pulse 62,  respirations 22, blood pressure 141/62, O2  saturation 99% on room air.  Height 75 inches, weight 100.1 kg.  GENERAL APPEARANCE:  A pleasant, well-nourished, well-developed  Caucasian male in no acute distress.  SKIN:  Warm and dry, no jaundice.  HEENT:  Sclerae are nonicteric.  Conjunctivae are pale.  Oropharyngeal  mucosa moist and pink.  CHEST:  Lungs are clear to auscultation.  CARDIOVASCULAR:  Regular rate and rhythm, no murmurs, rubs, or gallops.  ABDOMEN:  Positive bowel sounds.  Abdomen soft and nontender,  nondistended.  No organomegaly or masses, no rebound tenderness or  guarding.  No abdominal bruits.  He has a small umbilical hernia easily  reducible, nontender.  EXTREMITIES:  Lower extremities no edema.   LABORATORY DATA:  MET-7 normal except glucose of 68.  Hemoglobin as  above.  He has received two units of packed red blood cells, his  hemoglobin is now 8, white count 7100, platelets 252,000.  INR 0.9.   IMPRESSION:  Patient is a 65 year old gentleman with profound microcytic  anemia, most likely due to iron-deficiency anemia.  He is Hemoccult  negative x1.  He is on Chronic aspirin and Plavix.   Cannot exclude  chronic occult GI bleeding secondary to colorectal cancer, polyps, AVMs,  erosions.  Cannot exclude celiac disease at this time.   RECOMMENDATIONS:  1. Would recommend colonoscopy with possible EGD if negative.      Continue PPI therapy.  Consider one more unit of packed red blood      cells given patient's history of heart disease in order to      transfuse him closer to a      hemoglobin of 10.  2. Further recommendations to follow.   I would like to thank [facility] for allowing [place] to take part in  the care of this patient.      Tana Coast, P.AJonathon Bellows, M.D.  Electronically Signed    LL/MEDQ  D:  08/12/2007  T:  08/12/2007  Job:  [id number]   cc:   [name]  Fax: [phone]

## 2011-02-11 NOTE — Cardiovascular Report (Signed)
NAMEHARUO, STEPANEK [name]:  [id number]   MEDICAL RECORD [name]:  [id number]          PATIENT TYPE:  INP   LOCATION:  3733                         FACILITY:  [facility]   PHYSICIAN:  [name] Morton. Riley Kill, MD, FACCDATE OF BIRTH:  1946-02-24   DATE OF PROCEDURE:  DATE OF DISCHARGE:                            CARDIAC CATHETERIZATION   INDICATIONS:  [name] is a pleasant 65 year old who has had a  complicated cardiac course.  He has had stents placed previously  developed stent thrombosis, acute myocardial infarction, subsequently  underwent revascularization surgery.  He has been seen and evaluated for  possible defibrillator.  He now is seen because of recurrent chest  discomfort.  The current study was done to assess coronary anatomy.   PROCEDURES:  1. Left heart catheterization.  2. Selective coronary arteriography.  3. Selective left ventriculography.  4. Saphenous vein graft angiography.  5. Selective left internal mammary angiography.   DESCRIPTION OF THE PROCEDURE:  The patient was brought to the cath lab,  prepped and draped in the usual fashion.  We carefully prepped the right  groin.  Through an anterior puncture, the femoral artery was entered and  a 6-French sheath was placed.  Views of the left and right coronary  arteries were obtained in multiple angiographic projections.  Central  aortic and left ventricular pressures were measured with Pigtail  catheter.  Ventriculography was then performed in the RAO projection.  Overall, the patient tolerated the procedure well and there were no  complications.  He was taken to the holding area in satisfactory  clinical condition.  An ACT was checked because the patient was on  fractionated heparin.  It was satisfactory for sheath pull.    I then discussed the case with Dr. Ladona [name], and based on the fact that  his EF appears to be somewhat better by ventriculography, we elected to  do a 2-D echo before consideration  of placement of his AICD.  This  decision will be left up to Dr. Daleen [name] and Dr. Ladona [name].   HEMODYNAMIC DATA:  1. Central aortic pressure was 129/60, mean 86.  2. Left ventricular pressure 126/15.  3. There was no gradient pullback across the aortic valve.   ANGIOGRAPHIC DATA:  1. Left ventriculography was done in the RAO projection.  Ejection      fraction would be estimated in the 35-45% range.  There is an      anteroapical wall motion abnormality compatible with what appears      to be near akinesis to severe hypokinesis.  The anterobasal and      inferobasal segments appear to move well.  2. The left main has about 20-30% ostial narrowing, but does not      appear to be focally obstructive.  3. The LAD demonstrates about 80% in-stent restenosis.  There is a      competitive flow distally.  There is a ramus intermedius that comes      off or optional diagonal and this has about 70% narrowing.  Of      note, this is a small-to-moderate-sized vessel, but relatively  small in caliber over.  4. The left internal mammary to the distal LAD appears to be widely      patent.  There is minimal tapering, as it inserts into the LAD, but      it does not appear to be focally obstructed and runoff into the      distal LAD is excellent.  5. The circumflex is large-caliber vessel.  There is a large first      marginal branch that is stented.  There is about 50% proximal      narrowing and competitive flow distally.  The AV circumflex has 60-      70% narrowing after the takeoff of the marginal and maybe 50-60%      narrowing going into a moderate-size posterolateral branch.  These      do not appear to be critical.  6. The saphenous vein graft to the obtuse marginal is widely patent      and does fill some of the vessel retrograde.  7. The right coronary artery is severely diseased.  There is a stent      proximally that has about 80% narrowing.  There is then a 70-80%      narrowing proximal  to the mid stent and then diffuse 70% narrowing.  8. The saphenous vein graft into the distal right coronary artery is      very small in caliber and there is a small amount of damping due to      the caliber of the graft that inserts into a PDA then fills      retrograde into the distal right circulation.  9. Of note, the saphenous vein graft to the obtuse marginal has a      large valve, however, does not appear to be obstructive.   CONCLUSIONS:  1. Moderate reduction in left ventricular function, but improved from      the previous acute study prior to revascularization surgery.  2. Continued patency of the internal mammary to the left anterior      descending.  3. Continued patency of saphenous vein graft to the obtuse marginal.  4. Continued patency of saphenous vein graft to the posterior      descending.  5. Scattered disease involving the intermediate and the      atrioventricular circumflex distally with moderate stenosis, but      small-to-moderate-sized vessels.   DISPOSITION:  1. The patient will be taken to the holding area and sheath removed.  2. A 2-D echo will be repeated  3. Dr. Ladona [name] will then use this to make a final decision.  4. I spoke with Dr. Daleen [name] and he will have US obtain a D-dimer study.      [name] Morton. Riley Kill, MD, [facility]  Electronically Signed     TDS/MEDQ  D:  04/05/2008  T:  04/05/2008  Job:  [id number]   cc:   [name] C. Daleen Squibb, MD, [name]  [name] Canning. Ladona Ridgel, MD  [name], M.D.  CV Laboratory  [name]

## 2011-02-11 NOTE — Assessment & Plan Note (Signed)
[facility]                       [facility] OFFICE NOTE   [name], [name] [name]                     MRN:          [id number]  DATE:01/04/2009                            DOB:          August 01, 1946    [name] comes in today for followup.   His biggest complaint is he has had some swelling in his legs and his  weight has been up about 5-6 pounds.  He denies any orthopnea, PND.  He  has had no chest pain.  He has had no palpitations and no syncope.  He  has had no firing of his defibrillator.   He self adjusted his Lasix from 20-40 after calling us late March.  His  swelling has come down some and his weight is down 3-4 pounds.  Today,  he weighs 208; his normal weight is about 205 or little less.   He had a rest adenosine Myoview in November 2009.  This showed an  ejection fraction of 44% with scar of the distal anterior septal,  apical, and inferior wall.  He had minimal peri-infarct ischemia.  His  defibrillator and pacer were checked last on July 11, 2008.   For a list of his problems, please see previous notes as well as  discharge summaries.   MEDICATIONS:  1. Plavix 75 mg a day.  2. Lasix 40 mg a day.  3. Metformin 500 b.i.d.  4. Protonix 40 mg daily.  5. Lisinopril 10 mg b.i.d.  6. Coreg 12.5 b.i.d.  7. Lipitor 80 mg per day.  8. Glimepiride 4 mg b.i.d.  9. Neurontin 300 b.i.d.   His blood work is being followed by Dr. Charm Barges in [place].   PHYSICAL EXAMINATION:  GENERAL:  Today, he is extremely pleasant as  usual.  Premature graying.  He has residual effects of his stroke, which  were stable.  Many of his teeth are missing; those that are remaining  are in better shape than before.  NECK:  Supple.  There is no JVD.  Carotids are full bilaterally without  bruits.  Thyroid is not enlarged.  Trachea is midline.  LUNGS:  Clear to auscultation.  HEART:  Slightly displaced PMI.  There is a soft systolic murmur at the  apex.  There is no gallop.  His defibrillator/pacer is intact with no  tenderness and no increased mobility in the pocket.  Sternotomy site is  stable.  ABDOMEN:  Soft, good bowel sounds.  No midline bruit.  No hepatomegaly.  EXTREMITIES:  No cyanosis or clubbing.  Edema 1+ pretibially.  Pulses  were present, but diminished.  NEUROLOGIC:  Baseline.  He walks in and out on his own with a slight  limp.   ASSESSMENT AND PLAN:  [name] is stable except for the weight gain.  I have asked to continue with the Lasix 40 mg a day until his weight is  down to between 200 and 205 on his scales.  At that point, he can cut  back to 20 mg a day.  He may have to self adjust down the road as well.   I will  plan on seeing him back in 3 months for a careful followup.     Thomas C. Daleen Squibb, MD, [facility]  Electronically Signed    TCW/MedQ  DD: 01/04/2009  DT: 01/05/2009  Job #: [id number]   cc:   [name]

## 2011-02-11 NOTE — Discharge Summary (Signed)
[name], [name] [name]:  [id number]   MEDICAL RECORD [name]:  [id number]          PATIENT TYPE:  INP   LOCATION:  A214                          FACILITY:  [facility]   PHYSICIAN:  Dorris Singh, DO    DATE OF BIRTH:  August 01, 1946   DATE OF ADMISSION:  08/11/2007  DATE OF DISCHARGE:  11/15/2008LH                               DISCHARGE SUMMARY   ADMISSION DIAGNOSIS:  Severe anemia.   DISCHARGE DIAGNOSIS:  1. Microcytic anemia of unknown etiology.  2. Hypertension.  3. Type 2 diabetes.   CONSULT:  Dr. Jena Gauss was consulted.   PRIMARY CARE PHYSICIAN:  Dr. Rogelia Boga.   Tests that were done on the patient include the patient had an EGD and a  colonoscopy done on November 14.   HISTORY AND PHYSICAL:  Please refer to that that was done by Dr. Dorris Singh.  To summarize, this is a 65 year old Caucasian male who  presented to his primary care physician's office with a complaint of  fatigue.  It was found that his blood level was below 7.  He was then  sent to the ED in which he was admitted by the service of [facility].  Upon admission the patient was given 2 units of packed red blood cells  which brought him up to 8.2.  A decision was made to transfuse him 2  more units.  The patient currently is at 10.1.  He continued to improve.  Also, he was seen by GI. who scheduled patient for an upper endoscopy as  well as colonoscopy.  He then had that done.  No source for anemia was  found.  The patient continued to improve, and did not have any episodes  of blood loss while he was here.  At that point in time, it was  determined that patient could be sent home.  He is to follow up with Dr.  Luvenia Starch office to have a small-bowel capsular study completed, as well  as with Dr. Silvana Newness office to help to find the source of his anemia.  His condition is stable.  He was seen today and labs reveiwed.  He was  feeling much better, and ready to go home.   DISPOSITION:  Will be to home.   He has specific instructions:  He will  be sent home on Nu-Iron to take daily.  I also recommended some over-the-  counter medications upon the patient leaving for any problems with  constipation.  Also, he will be sent home on his home medications.  He  is sent home on:  1. Baclofen 60 mg at bedtime.  2. Metformin 500 mg 2 times a day.  3. Plavix 75 mg daily.  4. Metoprolol 25 mg daily.  5. Glyburide 4 mg 2 times a day.  6. Enalapril 10 mg daily.  7. OxyContin 40 mg 3 times a day.  8. Percocet 5/325 four times a day.  9. A new prescription of Nu-Iron as well.   The patient is discharged to follow up with his primary care physician  in 3-5 days  and with Dr. Jena Gauss  for follow up studies as directed above.      Dorris Singh, DO  Electronically Signed     CB/MEDQ  D:  08/14/2007  T:  08/16/2007  Job:  [id number]   cc:   [name]  Fax: [phone]

## 2011-02-11 NOTE — Assessment & Plan Note (Signed)
[facility]                            CARDIOLOGY OFFICE NOTE   NAME:[name], [name]                     MRN:          [id number]  DATE:09/15/2007                            DOB:          Nov 20, 1945    [name] returns today after a very prolonged hospitalization at [facility]   Summarization is as follows:   He presented on August 14, 2007 with an acute anterior wall myocardial  infarction. He had PTCA of the LAD and an old stent site.   Because of residual disease, he was not given Plavix.   Unfortunately, he re-occluded on two separate occasions requiring PCI of  the LAD as well as the ostial right. At one point he was in cardiogenic  shock.   Miraculously, he recovered and subsequently went to coronary bypass  grafting with Dr. Tressie Stalker. At that time, he was found to have  severe left ventricular dysfunction with akinesis of the distal anterior  wall, apex and septum plus hypokinesia of the inferior wall. He had good  quality of the left internal mammary artery and saphenous vein graft  which was used for grafting. His distal right was small.   He had internal mammary graft place to distal LAD, vein graft to the  first circumflex marginal, vein graft to the posterior descending  vessel.   He recovered beautifully and actually went home on post operative day 3  or 4.   Since discharge, his biggest complaint has been constipation. He takes  oxycodone for chronic right-sided pain from an old stroke.   His course was also complicated by atrial fibrillation. This was in the  post operative period.   His other problems include:  1. Iron deficiency anemia of an uncertain etiology. He has had a      previous negative endoscopy and colonoscopy just days prior to his      anterior wall infarct at [facility]. There were plans for      a small bowel study down the road.  2. Diabetes mellitus, type 2, for years.  3. History  of cerebral vascular disease and a left-sided stroke in      1990s.  4. History of hyperlipidemia.  5. Hypertension.  6. History of a left internal carotid artery occlusion.   CURRENT MEDICATIONS:  1. Glucophage 500 mg p.o. b.i.d.  2. Amaryl 4 mg p.o. b.i.d.  3. Aspirin 325 mg a day.  4. Plavix 75 mg a day.  5. Baclofen 40 mg q.h.s.  6. Potassium 20 mEq a day.  7. Protonix 40 mg a day.  8. Lipitor 80 mg a day.  9. Lasix 40 mg a day.  10.Lisinopril 10 mg b.i.d.  11.Coreg 12.5 mg p.o. b.i.d.  12.Oxycodone 5/325 1-2 q4-6 hours.  13.Amiodarone 400 mg a day.  14.Stool softener.   On occasion, he is taking milk of magnesia for constipation.   He denies any orthopnea, PND. Some mild peripheral edema. He has had no  angina. His incision is healing nicely.   Blood pressure 135/70, pulse 59 and regular, weight 207.  SKIN:  Clear and dry. His skin color is good.  HEENT: Normocephalic, atraumatic. PERRLA. Extra-ocular movements intact.  Sclerae are injected. Facial symmetry is normal, except for a slight  right facial droop which is baseline.  NECK: Supple. Carotid upstrokes are equal bilaterally without bruits.  Thyroid is not enlarged. Trachea is midline.  LUNGS:  Reveal decreased breath sounds in the left base with dullness to  percussion persistent with an effusion. His chest incision is healing  well.  HEART: He has a nondisplaced PMI. He has no S3 gallop. There is no rub.  ABDOMEN: Soft with good bowel sounds. He has residual Steri-Strips over  his mediastinal tube sites. Soft otherwise.  EXTREMITIES: Reveals 1+ peripheral edema. Pulses were present, but  reduced.  NEURO: Baseline.   ELECTROCARDIOGRAM:  Shows sinus rhythm with inferior wall infarct and an  anterior septal infarct pattern. His QTC is quite prolonged at over 500  milliseconds. This is significantly different from his last EKG here in  the office.   ASSESSMENT/PLAN:  [name] is stable and slowly recovering.  He was  extremely sick in the hospital, but has responded well to surgery. He  clearly needs to continue with his current medications except I am going  to stop his amiodarone which with his prolonged QT. Will check another  EKG next week before Christmas.   He has been told as has his wife that he should stay on aspirin and  Plavix indefinitely. At some point, he needs a small bowel capsule  study, but right now he is still in recovery. His medications otherwise  will remain the same. We will check a Chem-7, free T4 and a TSH today  since he has been on amiodarone. We also need to be careful with his  renal function.   Please note that he has had some myalgias in the past with Lipitor. He  is back on this drug. We have to monitor this very closely. He also  cannot take Crestor.   Overall, I think [name] is doing well. I have made no other changes  than that mentioned above. Will see him back for close followup in about  six weeks. He is seeing Dr. Cornelius Moras in four weeks.     Thomas C. Daleen Squibb, MD, [facility]  Electronically Signed    TCW/MedQ  DD: 09/15/2007  DT: 09/15/2007  Job #: [id number]

## 2011-02-11 NOTE — H&P (Signed)
[name], [name] [name]:  [id number]   MEDICAL RECORD [name]:  [id number]          PATIENT TYPE:  INP   LOCATION:  A214                          FACILITY:  [facility]   PHYSICIAN:  Skeet Latch, DO    DATE OF BIRTH:  26-Oct-1945   DATE OF ADMISSION:  08/11/2007  DATE OF DISCHARGE:  LH                              HISTORY & PHYSICAL   CHIEF COMPLAINT:  Dizziness and fatigue.   HISTORY OF PRESENT ILLNESS:  This is a 65 year old Caucasian male who  presents with a 55-month history of dizziness and fatigue.  Apparently  the patient presented to his primary care physician's office for a  routine checkup for his diabetes and was found to have a hemoglobin of  6.7.  The patient was immediately sent to the emergency room for  evaluation. The patient states that over the last few months, he has  been having dizzy episodes as well as fatigue of unknown origin. The  patient denies any bleeding or hematemesis of any kind.  The patient  does have a history of coronary artery disease, stroke, hyperlipidemia,  type 2 diabetes and osteoarthritis. The patient presented to the  emergency room with a hemoglobin of 6.7 and other than being fatigued  had no her complaints at this time.  Upon my exam, the patient is  receiving a blood transfusion and states that he is feeling better.   PAST MEDICAL HISTORY:  Coronary artery disease, history of stroke,  hyperlipidemia, type 2 diabetes and osteoarthritis. Hypertension.   PAST SURGICAL HISTORY:  Left total knee replacement.   FAMILY HISTORY:  Positive for diabetes, coronary artery disease.   SOCIAL HISTORY:  Nonsmoker, no drinking, no drug abuse.   ALLERGIES:  CODEINE.   HOME MEDICATIONS:  1. Baclofen 60 mg at bedtime.  2. Metformin 500 mg twice a day.  3. Plavix 75 mg daily.  4. Metoprolol 25 mg daily.  5. Loperamide 4 mg twice a day.  6. Enalapril 10 mg daily.  7. OxyContin 40 mg 3 times a day.  8. Percocet 5/325 mg 4 times a  day.   REVIEW OF SYSTEMS:  CONSTITUTIONAL:  Denies any weight gain, weight  loss.  Positive for a weakness and no chills. Positive for fatigue.  HEENT:  Unremarkable.  CARDIOVASCULAR:  Unremarkable.  GI: Unremarkable.  GU:  Unremarkable. MUSCULOSKELETAL:  Positive for arthritis.  SKIN:  No  pruritus or rashes.  NEUROLOGIC: No headache, confusion, altered mental  status changes.  METABOLIC:  No excessive thirst, heat or cold  intolerance. HEMATOLOGIC:  No history of anemia, bleeding or bruising  easily.   PHYSICAL EXAM:  VITAL SIGNS:  Temperature is 98, pulse 65, respirations  20, blood pressure 131/61.  The patient is sating 98% on room air.  GENERAL:  The patient is awake and alert, oriented x3.  The patient is  in no acute distress, pleasant and cooperative.  HEENT:  Head is atraumatic, normocephalic.  PERRLA.  EOMI.  NECK:  Soft, supple, nontender, nondistended, no JVD.  No thyromegaly.  CARDIOVASCULAR:  Positive 2-3/6 systolic murmur, no rubs or gallops.  LUNGS:  Clear to auscultation bilaterally.  No rales, rhonchi or  wheezing.  ABDOMEN:  Soft, nontender, nondistended with positive bowel sounds.  EXTREMITIES:  Trace edema bilateral lower extremities.  No clubbing or  cyanosis.  NEUROLOGIC:  Cranial nerves II-XII are grossly intact.  The patient's  motor sensations were intact.   LABS:  Sodium 137, potassium 4.4, chloride 105, CO2 25, glucose 202, BUN  15, creatinine 1.09. PTT 29.  PT 12.7, INR 0.9.  White count 7000,  hemoglobin 6.7, hematocrit 22.4, MCV was 60.7, platelet count is 305.   ASSESSMENT:  This is a 65 year old Caucasian male who presents with  anemia with dizziness and fatigue.  The patient was seen at his primary  care physician's office and found to have a hemoglobin of 6.7 with no  obvious signs of bleeding. The Patient is stable and receiving blood  transfusion at this time.   PLAN:  1. Anemia.  The patient is receiving 2 units of packed red blood       cells. Will get a repeat H&H. Will consult GI at this time. The      patient states he has not had a colonoscopy but one was scheduled      for the near future. Will get iron studies at this time.  The      patient was guaiac negative in the ER.  2. Diabetes. The patient will placed on his home medications and will      have Accu-Cheks a.c. and h.s. and be placed on a sensitive sliding      scale with meal coverage.  3. Hypertension.  The patient will be placed on his home medications.      This will be followed closely.  The next the patient will be placed      on GI as well as DVT prophylaxis.      Skeet Latch, DO  Electronically Signed     SM/MEDQ  D:  08/12/2007  T:  08/12/2007  Job:  [id number]

## 2011-02-14 ENCOUNTER — Telehealth: Payer: Self-pay | Admitting: Cardiology

## 2011-02-14 MED ORDER — SPIRONOLACTONE 25 MG PO TABS: 25.0000 mg | ORAL_TABLET | Freq: Every day | ORAL | Status: DC

## 2011-02-14 NOTE — Telephone Encounter (Signed)
Pt needs rx Spironolactone to be call in to [facility] in [place] # [phone]

## 2011-02-19 ENCOUNTER — Other Ambulatory Visit: Payer: Self-pay | Admitting: Cardiology

## 2011-03-03 ENCOUNTER — Encounter: Payer: Self-pay | Admitting: Internal Medicine

## 2011-03-03 ENCOUNTER — Encounter: Payer: Self-pay | Admitting: Cardiology

## 2011-03-03 ENCOUNTER — Telehealth: Payer: Self-pay | Admitting: Internal Medicine

## 2011-03-03 NOTE — Telephone Encounter (Signed)
Discuss medication.

## 2011-03-19 ENCOUNTER — Other Ambulatory Visit (HOSPITAL_COMMUNITY): Payer: Self-pay | Admitting: Oncology

## 2011-03-19 ENCOUNTER — Encounter (HOSPITAL_BASED_OUTPATIENT_CLINIC_OR_DEPARTMENT_OTHER): Payer: Medicare Other | Admitting: Oncology

## 2011-03-19 DIAGNOSIS — E538 Deficiency of other specified B group vitamins: Secondary | ICD-10-CM

## 2011-03-19 DIAGNOSIS — N289 Disorder of kidney and ureter, unspecified: Secondary | ICD-10-CM

## 2011-03-19 DIAGNOSIS — D649 Anemia, unspecified: Secondary | ICD-10-CM

## 2011-03-19 LAB — CBC WITH DIFFERENTIAL/PLATELET
BASO%: 0.2 % (ref 0.0–2.0)
Eosinophils Absolute: 0.2 10*3/uL (ref 0.0–0.5)
MCHC: 33.6 g/dL (ref 32.0–36.0)
MONO#: 0.5 10*3/uL (ref 0.1–0.9)
MONO%: 7.7 % (ref 0.0–14.0)
NEUT#: 5 10*3/uL (ref 1.5–6.5)
RBC: 4.16 10*6/uL — ABNORMAL LOW (ref 4.20–5.82)
RDW: 14.8 % — ABNORMAL HIGH (ref 11.0–14.6)
WBC: 6.9 10*3/uL (ref 4.0–10.3)

## 2011-03-19 LAB — VITAMIN B12: Vitamin B-12: 479 pg/mL (ref 211–911)

## 2011-03-25 ENCOUNTER — Ambulatory Visit (INDEPENDENT_AMBULATORY_CARE_PROVIDER_SITE_OTHER): Payer: Medicare Other | Admitting: Internal Medicine

## 2011-03-25 ENCOUNTER — Encounter: Payer: Self-pay | Admitting: Internal Medicine

## 2011-03-25 VITALS — BP 123/66 | HR 68 | Resp 16 | Ht 74.0 in | Wt 203.0 lb

## 2011-03-25 DIAGNOSIS — I428 Other cardiomyopathies: Secondary | ICD-10-CM

## 2011-03-25 DIAGNOSIS — I2589 Other forms of chronic ischemic heart disease: Secondary | ICD-10-CM

## 2011-03-25 DIAGNOSIS — I509 Heart failure, unspecified: Secondary | ICD-10-CM

## 2011-03-25 DIAGNOSIS — Z95810 Presence of automatic (implantable) cardiac defibrillator: Secondary | ICD-10-CM

## 2011-03-25 NOTE — Patient Instructions (Addendum)
Your physician wants you to follow-up in: 12 months with Dr Court Joy will receive a reminder letter in the mail two months in advance. If you don't receive a letter, please call our office to schedule the follow-up appointment.  Your physician recommends that you schedule a follow-up appointment in: 3 months with device clinic

## 2011-03-27 ENCOUNTER — Encounter: Payer: Self-pay | Admitting: Internal Medicine

## 2011-03-27 NOTE — Assessment & Plan Note (Signed)
He denies anginal symptoms. He will continue his current meds.  

## 2011-03-27 NOTE — Assessment & Plan Note (Signed)
His device is working normally. Will recheck in several months. 

## 2011-03-27 NOTE — Assessment & Plan Note (Signed)
His symptoms remain class 2-3. He will continue his current meds. I have asked him to maintain a low sodium diet.

## 2011-03-27 NOTE — Progress Notes (Signed)
HPI [name] returns today for followup. He is a 65 yo man with a h/o an ICM, CHF, s/p CABG, and s/p ICD. The patient denies c/p, or syncope. He has class 2 CHF symptoms. He has had no intercurrent ICD therapies. Allergies  Allergen Reactions  . Codeine     REACTION: G I  distress     Current Outpatient Prescriptions  Medication Sig Dispense Refill  . aspirin 325 MG tablet Take 325 mg by mouth daily.        [name] atorvastatin (LIPITOR) 80 MG tablet Take 40 mg by mouth daily.       . baclofen (LIORESAL) 10 MG tablet Take 40 mg by mouth daily.        . carvedilol (COREG) 12.5 MG tablet Take 12.5 mg by mouth 2 (two) times daily with a meal.        . clopidogrel (PLAVIX) 75 MG tablet Take 75 mg by mouth daily.        [name] docusate sodium (COLACE) 100 MG capsule Take 100 mg by mouth 2 (two) times daily.        . furosemide (LASIX) 40 MG tablet Take 1 tablet (40 mg total) by mouth as directed.  30 tablet  4  . glimepiride (AMARYL) 4 MG tablet Take 4 mg by mouth daily before breakfast.        . lisinopril (PRINIVIL,ZESTRIL) 10 MG tablet Take 10 mg by mouth 2 (two) times daily.        . metFORMIN (GLUCOPHAGE-XR) 500 MG 24 hr tablet Take 500 mg by mouth 2 (two) times daily.        . nitroGLYCERIN (NITROSTAT) 0.4 MG SL tablet Place 0.4 mg under the tongue as directed.        [name] oxyCODONE-acetaminophen (PERCOCET) 5-325 MG per tablet Take 1 tablet by mouth every 4 (four) hours as needed.        [name] spironolactone (ALDACTONE) 25 MG tablet Take 1 tablet (25 mg total) by mouth daily.  30 tablet  11     Past Medical History  Diagnosis Date  . Cerebrovascular disease   . Anemia, iron deficiency   . Sinus bradycardia   . Tobacco abuse   . Arm pain   . Chronic renal insufficiency   . Hypertension   . Osteoarthritis   . Diabetes mellitus   . Anemia   . Mitral regurgitation   . Systolic CHF, chronic   . PVD (peripheral vascular disease)   . Cardiomyopathy, ischemic   . Hx of CABG   . Heart attack   .  CAD (coronary artery disease)   . Unstable angina   . Stroke     ROS:   All systems reviewed and negative except as noted in the HPI.   Past Surgical History  Procedure Date  . Coronary artery bypass graft   . Percutaneous coronary intervention 01/10/2005    Charlies Constable, MD  . Coronary artery bypass graft 08/29/2007    x3; Salvatore Decent. Cornelius Moras MD  . Cardiac defibrillator placement 04/05/2008    Implantation of a [employer] dual-chamber defibrillator, Doylene Canning. Ladona Ridgel , MD     Family History  Problem Relation Age of Onset  . Heart attack Mother     CVA, MI  . Diabetes Mother   . Heart attack Father     MI  . Diabetes Sister   . Coronary artery disease Brother     CABG     History  Social History  . Marital Status: Married    Spouse Name: N/A    Number of Children: N/A  . Years of Education: N/A   Occupational History  . Retired    Social History Main Topics  . Smoking status: Former Smoker -- 1.0 packs/day for 15 years    Quit date: 09/29/1990  . Smokeless tobacco: Current User  . Alcohol Use: No  . Drug Use: No  . Sexually Active: Not on file   Other Topics Concern  . Not on file   Social History Narrative   [place] in [place] with wifeBeen on disability since his stroke in the 1990sNot routinely exercising     BP 123/66  Pulse 68  Resp 16  Ht 6\' 2"  (1.88 m)  Wt 203 lb (92.08 kg)  BMI 26.06 kg/m2  Physical Exam:  Middle age man who looks older than his stated age. HEENT: Unremarkable Neck:  No JVD, no thyromegally Lymphatics:  No adenopathy Back:  No CVA tenderness Lungs:  Clear. No wheezes, rales, or rhonchi HEART:  Regular rate rhythm, no murmurs, no rubs, no clicks Abd:  Soft, positive bowel sounds, no organomegally, no rebound, no guarding Ext:  2 plus pulses, no edema, no cyanosis, no clubbing Skin:  No rashes no nodules Neuro:  CN II through XII intact, motor grossly intact  DEVICE  Normal device function.  See PaceArt for details.    Assess/Plan:

## 2011-05-01 ENCOUNTER — Other Ambulatory Visit: Payer: Self-pay | Admitting: Cardiology

## 2011-05-01 NOTE — Telephone Encounter (Signed)
Pt calling back to request refill be done today, pt out

## 2011-06-01 ENCOUNTER — Other Ambulatory Visit: Payer: Self-pay | Admitting: Cardiology

## 2011-06-04 ENCOUNTER — Telehealth: Payer: Self-pay | Admitting: Physician Assistant

## 2011-06-04 NOTE — Telephone Encounter (Signed)
Pt calling in regarding pt refill of carvedilol.

## 2011-06-04 NOTE — Telephone Encounter (Signed)
Pt called [name], 9/2, concerned because he took an additional dose of Plavix 75 mg, 2 hrs after his AM dose. He denied any bleeding or bruising. He also took his usual dose of ASA 325 mg.  I advised him to not take the ASA the following day, but to still take the Plavix. He could then resume both on Tues, 9/4.

## 2011-06-23 ENCOUNTER — Encounter: Payer: Medicare Other | Admitting: *Deleted

## 2011-06-25 ENCOUNTER — Encounter (HOSPITAL_BASED_OUTPATIENT_CLINIC_OR_DEPARTMENT_OTHER): Payer: Medicare Other | Admitting: Oncology

## 2011-06-25 ENCOUNTER — Other Ambulatory Visit (HOSPITAL_COMMUNITY): Payer: Self-pay | Admitting: Oncology

## 2011-06-25 DIAGNOSIS — N289 Disorder of kidney and ureter, unspecified: Secondary | ICD-10-CM

## 2011-06-25 DIAGNOSIS — D649 Anemia, unspecified: Secondary | ICD-10-CM

## 2011-06-25 DIAGNOSIS — E538 Deficiency of other specified B group vitamins: Secondary | ICD-10-CM

## 2011-06-25 LAB — CBC WITH DIFFERENTIAL/PLATELET
BASO%: 0.4 % (ref 0.0–2.0)
EOS%: 2 % (ref 0.0–7.0)
HCT: 43.5 % (ref 38.4–49.9)
LYMPH%: 19.7 % (ref 14.0–49.0)
MCH: 30.9 pg (ref 27.2–33.4)
MCHC: 33.7 g/dL (ref 32.0–36.0)
NEUT%: 69.5 % (ref 39.0–75.0)
Platelets: 207 10*3/uL (ref 140–400)

## 2011-06-26 LAB — CBC
HCT: 38.3 — ABNORMAL LOW
HCT: 41
Hemoglobin: 12.8 — ABNORMAL LOW
Hemoglobin: 13
Hemoglobin: 13.3
MCHC: 32.5
MCV: 89.2
Platelets: 136 — ABNORMAL LOW
Platelets: 143 — ABNORMAL LOW
RBC: 4.3
RDW: 16.1 — ABNORMAL HIGH
RDW: 16.5 — ABNORMAL HIGH
WBC: 4.9
WBC: 6.8

## 2011-06-26 LAB — D-DIMER, QUANTITATIVE: D-Dimer, Quant: 0.61 — ABNORMAL HIGH

## 2011-06-26 LAB — BASIC METABOLIC PANEL
CO2: 26
Calcium: 8.9
Chloride: 105
Creatinine, Ser: 0.94
GFR calc Af Amer: >60
GFR calc Af Amer: >60
GFR calc non Af Amer: >60
Potassium: 4.1
Sodium: 137
Sodium: 140

## 2011-06-26 LAB — PROTIME-INR
INR: 1
INR: 1.1
Prothrombin Time: 13.2
Prothrombin Time: 14

## 2011-06-26 LAB — POCT I-STAT, CHEM 8
Calcium, Ion: 0.99 — ABNORMAL LOW
Chloride: 106
HCT: 42
Hemoglobin: 14.3
TCO2: 21

## 2011-06-26 LAB — CARDIAC PANEL(CRET KIN+CKTOT+MB+TROPI)
CK, MB: 1.5
Relative Index: INVALID
Relative Index: INVALID
Total CK: 66
Total CK: 80
Troponin I: <0.01

## 2011-06-26 LAB — TSH: TSH: 1.198 (ref 0.350–4.500)

## 2011-06-26 LAB — DIFFERENTIAL
Basophils Absolute: 0
Basophils Relative: 0
Eosinophils Relative: 2
Monocytes Absolute: 0.6
Neutro Abs: 6.9

## 2011-06-26 LAB — APTT
aPTT: 29
aPTT: 39 — ABNORMAL HIGH

## 2011-06-26 LAB — LIPID PANEL
LDL Cholesterol: 41
Triglycerides: 59
VLDL: 12

## 2011-06-26 LAB — POCT CARDIAC MARKERS
Operator id: 285491
Troponin i, poc: <0.05

## 2011-06-26 LAB — TROPONIN I: Troponin I: 0.02

## 2011-06-26 LAB — COMPREHENSIVE METABOLIC PANEL
Albumin: 3.6
BUN: 18
Creatinine, Ser: 1.09
Glucose, Bld: 111 — ABNORMAL HIGH
Total Protein: 5.8 — ABNORMAL LOW

## 2011-06-29 LAB — COMPREHENSIVE METABOLIC PANEL
Albumin: 4.6 g/dL (ref 3.5–5.2)
Alkaline Phosphatase: 79 U/L (ref 39–117)
BUN: 20 mg/dL (ref 6–23)
CO2: 25 mEq/L (ref 19–32)
Calcium: 8.7 mg/dL (ref 8.4–10.5)
Chloride: 106 mEq/L (ref 96–112)
Glucose, Bld: 186 mg/dL — ABNORMAL HIGH (ref 70–99)
Potassium: 4.7 mEq/L (ref 3.5–5.3)
Sodium: 141 mEq/L (ref 135–145)
Total Protein: 6.6 g/dL (ref 6.0–8.3)

## 2011-06-29 LAB — LACTATE DEHYDROGENASE: LDH: 168 U/L (ref 94–250)

## 2011-06-29 LAB — IRON AND TIBC
%SAT: 23 % (ref 20–55)
Iron: 78 ug/dL (ref 42–165)

## 2011-06-29 LAB — FERRITIN: Ferritin: 49 ng/mL (ref 22–322)

## 2011-07-07 LAB — I-STAT EC8
BUN: 15
Chloride: 100
HCT: 27 — ABNORMAL LOW
Hemoglobin: 9.2 — ABNORMAL LOW
Operator id: 203371
Sodium: 136
pCO2 arterial: 29.1 — ABNORMAL LOW

## 2011-07-07 LAB — POCT I-STAT 3, ART BLOOD GAS (G3+)
Acid-Base Excess: 3 — ABNORMAL HIGH
Bicarbonate: 25.9 — ABNORMAL HIGH
O2 Saturation: 98
O2 Saturation: 98
O2 Saturation: 98
O2 Saturation: 99
O2 Saturation: 99
Operator id: 296031
Patient temperature: 37.9
TCO2: 27
TCO2: 27
pCO2 arterial: 33.9 — ABNORMAL LOW
pCO2 arterial: 34.7 — ABNORMAL LOW
pCO2 arterial: 35.3
pCO2 arterial: 35.5
pH, Arterial: 7.496 — ABNORMAL HIGH
pH, Arterial: 7.516 — ABNORMAL HIGH
pH, Arterial: 7.54 — ABNORMAL HIGH
pO2, Arterial: 108 — ABNORMAL HIGH
pO2, Arterial: 92
pO2, Arterial: 98

## 2011-07-07 LAB — B-NATRIURETIC PEPTIDE (CONVERTED LAB)
Pro B Natriuretic peptide (BNP): 624 — ABNORMAL HIGH
Pro B Natriuretic peptide (BNP): 730 — ABNORMAL HIGH

## 2011-07-07 LAB — BASIC METABOLIC PANEL
BUN: 14
CO2: 26
CO2: 32
Calcium: 8 — ABNORMAL LOW
Calcium: 8.3 — ABNORMAL LOW
Chloride: 93 — ABNORMAL LOW
Chloride: 96
Creatinine, Ser: 0.81
Creatinine, Ser: 1.06
GFR calc Af Amer: >60
GFR calc Af Amer: >60
GFR calc Af Amer: >60
GFR calc Af Amer: >60
GFR calc non Af Amer: >60
GFR calc non Af Amer: >60
GFR calc non Af Amer: >60
Potassium: 3.1 — ABNORMAL LOW
Potassium: 3.5
Potassium: 3.5
Potassium: 3.8
Sodium: 137
Sodium: 137
Sodium: 138

## 2011-07-07 LAB — PROTIME-INR
INR: 1.4
Prothrombin Time: 17 — ABNORMAL HIGH

## 2011-07-07 LAB — CBC
HCT: 26 — ABNORMAL LOW
HCT: 26.6 — ABNORMAL LOW
HCT: 29.1 — ABNORMAL LOW
Hemoglobin: 8.8 — ABNORMAL LOW
Hemoglobin: 9.7 — ABNORMAL LOW
MCV: 77.1 — ABNORMAL LOW
Platelets: 405 — ABNORMAL HIGH
Platelets: 466 — ABNORMAL HIGH
RBC: 3.42 — ABNORMAL LOW
RBC: 3.77 — ABNORMAL LOW
RBC: 3.88 — ABNORMAL LOW
RDW: 28.3 — ABNORMAL HIGH
WBC: 10.2
WBC: 10.5
WBC: 10.8 — ABNORMAL HIGH
WBC: 11.5 — ABNORMAL HIGH
WBC: 9.5

## 2011-07-07 LAB — MAGNESIUM
Magnesium: 1.9
Magnesium: 2.2
Magnesium: 2.4

## 2011-07-07 LAB — CREATININE, SERUM
Creatinine, Ser: 0.92
GFR calc Af Amer: >60

## 2011-07-07 LAB — POTASSIUM: Potassium: 3.2 — ABNORMAL LOW

## 2011-07-07 LAB — EXPECTORATED SPUTUM ASSESSMENT W GRAM STAIN, RFLX TO RESP C

## 2011-07-07 LAB — CULTURE, RESPIRATORY W GRAM STAIN: Culture: NORMAL

## 2011-07-08 LAB — I-STAT EC8
BUN: 13
Chloride: 101
HCT: 30 — ABNORMAL LOW
Hemoglobin: 10.2 — ABNORMAL LOW
Operator id: 125911
Sodium: 135

## 2011-07-08 LAB — HEMOGLOBIN A1C
Hgb A1c MFr Bld: 6.5 — ABNORMAL HIGH
Mean Plasma Glucose: 154
Mean Plasma Glucose: 140

## 2011-07-08 LAB — BLOOD GAS, ARTERIAL
Acid-Base Excess: 3.3 — ABNORMAL HIGH
Acid-base deficit: 0.7
Acid-base deficit: 7 — ABNORMAL HIGH
Bicarbonate: 19.1 — ABNORMAL LOW
Bicarbonate: 27.2 — ABNORMAL HIGH
Delivery systems: POSITIVE
Drawn by: 23588
FIO2: 0.21
FIO2: 0.4
FIO2: 1
MECHVT: 600
O2 Saturation: 98
O2 Saturation: 99.8
PEEP: 5
Patient temperature: 98.6
Patient temperature: 98.6
Patient temperature: 98.6
Pressure support: 18
RATE: 20
TCO2: 20.6
TCO2: 28.5
pCO2 arterial: 38.2
pCO2 arterial: 42.1
pH, Arterial: 7.393
pH, Arterial: 7.404
pO2, Arterial: 413 — ABNORMAL HIGH
pO2, Arterial: 66.6 — ABNORMAL LOW
pO2, Arterial: 95.3

## 2011-07-08 LAB — POCT I-STAT 4, (NA,K, GLUC, HGB,HCT)
Glucose, Bld: 100 — ABNORMAL HIGH
Glucose, Bld: 120 — ABNORMAL HIGH
Glucose, Bld: 153 — ABNORMAL HIGH
Glucose, Bld: 178 — ABNORMAL HIGH
HCT: 25 — ABNORMAL LOW
HCT: 26 — ABNORMAL LOW
HCT: 30 — ABNORMAL LOW
Hemoglobin: 8.5 — ABNORMAL LOW
Hemoglobin: 8.8 — ABNORMAL LOW
Operator id: 262201
Operator id: 3402
Operator id: 3402
Operator id: 3402
Potassium: 3.4 — ABNORMAL LOW
Potassium: 3.6
Potassium: 3.8
Sodium: 134 — ABNORMAL LOW
Sodium: 136
Sodium: 137
Sodium: 140

## 2011-07-08 LAB — BASIC METABOLIC PANEL
BUN: 12
BUN: 12
BUN: 16
BUN: 16
BUN: 19
BUN: 9
BUN: 13
CO2: 25
CO2: 25
CO2: 26
CO2: 28
Calcium: 7.8 — ABNORMAL LOW
Calcium: 8.4
Calcium: 8.5
Calcium: 8.5
Calcium: 8.7
Calcium: 8.7
Calcium: 8.9
Chloride: 101
Chloride: 101
Chloride: 102
Chloride: 102
Chloride: 105
Chloride: 105
Chloride: 96
Creatinine, Ser: 0.91
Creatinine, Ser: 0.95
Creatinine, Ser: 0.95
Creatinine, Ser: 1.03
Creatinine, Ser: 1.04
Creatinine, Ser: 1.05
Creatinine, Ser: 1.18
Creatinine, Ser: 0.98
GFR calc Af Amer: >60
GFR calc Af Amer: >60
GFR calc Af Amer: >60
GFR calc Af Amer: >60
GFR calc Af Amer: >60
GFR calc Af Amer: >60
GFR calc non Af Amer: >60
GFR calc non Af Amer: >60
GFR calc non Af Amer: >60
GFR calc non Af Amer: >60
GFR calc non Af Amer: >60
Glucose, Bld: 178 — ABNORMAL HIGH
Glucose, Bld: 178 — ABNORMAL HIGH
Glucose, Bld: 68 — ABNORMAL LOW
Potassium: 3.4 — ABNORMAL LOW
Potassium: 4.1
Potassium: 4.2
Potassium: 4.3
Sodium: 133 — ABNORMAL LOW
Sodium: 136
Sodium: 136
Sodium: 137
Sodium: 137
Sodium: 143

## 2011-07-08 LAB — CBC
HCT: 27.2 — ABNORMAL LOW
HCT: 27.9 — ABNORMAL LOW
HCT: 28.4 — ABNORMAL LOW
HCT: 28.4 — ABNORMAL LOW
HCT: 28.9 — ABNORMAL LOW
HCT: 29.8 — ABNORMAL LOW
HCT: 30.6 — ABNORMAL LOW
HCT: 31.1 — ABNORMAL LOW
HCT: 31.2 — ABNORMAL LOW
HCT: 31.7 — ABNORMAL LOW
HCT: 34.6 — ABNORMAL LOW
HCT: 35.5 — ABNORMAL LOW
HCT: 34.9 — ABNORMAL LOW
Hemoglobin: 10 — ABNORMAL LOW
Hemoglobin: 11.3 — ABNORMAL LOW
Hemoglobin: 8.7 — ABNORMAL LOW
Hemoglobin: 8.7 — ABNORMAL LOW
Hemoglobin: 8.7 — ABNORMAL LOW
Hemoglobin: 9 — ABNORMAL LOW
Hemoglobin: 9 — ABNORMAL LOW
Hemoglobin: 9.2 — ABNORMAL LOW
Hemoglobin: 9.2 — ABNORMAL LOW
Hemoglobin: 9.4 — ABNORMAL LOW
Hemoglobin: 9.4 — ABNORMAL LOW
Hemoglobin: 9.6 — ABNORMAL LOW
Hemoglobin: 9.6 — ABNORMAL LOW
Hemoglobin: 9.8 — ABNORMAL LOW
Hemoglobin: 10.9 — ABNORMAL LOW
Hemoglobin: 6.7 — CL
MCHC: 30.7
MCHC: 31
MCHC: 31.3
MCHC: 31.5
MCHC: 31.6
MCHC: 31.7
MCHC: 31.8
MCHC: 31.9
MCHC: 32
MCHC: 32.1
MCHC: 32.5
MCHC: 30.6
MCV: 67 — ABNORMAL LOW
MCV: 68 — ABNORMAL LOW
MCV: 68.2 — ABNORMAL LOW
MCV: 68.7 — ABNORMAL LOW
MCV: 70.1 — ABNORMAL LOW
MCV: 70.7 — ABNORMAL LOW
MCV: 72.9 — ABNORMAL LOW
MCV: 73.4 — ABNORMAL LOW
MCV: 73.4 — ABNORMAL LOW
MCV: 73.5 — ABNORMAL LOW
MCV: 74.9 — ABNORMAL LOW
MCV: 76.9 — ABNORMAL LOW
Platelets: 258
Platelets: 272
Platelets: 279
Platelets: 283
Platelets: 303
Platelets: 309
Platelets: 427 — ABNORMAL HIGH
Platelets: 448 — ABNORMAL HIGH
Platelets: 639 — ABNORMAL HIGH
Platelets: 252
Platelets: 295
RBC: 3.63 — ABNORMAL LOW
RBC: 3.7 — ABNORMAL LOW
RBC: 3.85 — ABNORMAL LOW
RBC: 3.86 — ABNORMAL LOW
RBC: 4.05 — ABNORMAL LOW
RBC: 4.06 — ABNORMAL LOW
RBC: 4.09 — ABNORMAL LOW
RBC: 4.17 — ABNORMAL LOW
RBC: 4.22
RBC: 4.26
RBC: 4.56
RBC: 4.7
RBC: 4.85
RBC: 3.7 — ABNORMAL LOW
RDW: 25.7 — ABNORMAL HIGH
RDW: 26.3 — ABNORMAL HIGH
RDW: 26.5 — ABNORMAL HIGH
RDW: 26.6 — ABNORMAL HIGH
RDW: 26.7 — ABNORMAL HIGH
RDW: 26.8 — ABNORMAL HIGH
RDW: 26.8 — ABNORMAL HIGH
RDW: 26.8 — ABNORMAL HIGH
RDW: 26.9 — ABNORMAL HIGH
RDW: 27.2 — ABNORMAL HIGH
RDW: 28.2 — ABNORMAL HIGH
RDW: 28.3 — ABNORMAL HIGH
RDW: 29 — ABNORMAL HIGH
RDW: 29.6 — ABNORMAL HIGH
RDW: 30.5 — ABNORMAL HIGH
RDW: 19 — ABNORMAL HIGH
RDW: 24.1 — ABNORMAL HIGH
RDW: 25.3 — ABNORMAL HIGH
WBC: 10.5
WBC: 10.6 — ABNORMAL HIGH
WBC: 12.6 — ABNORMAL HIGH
WBC: 13.1 — ABNORMAL HIGH
WBC: 14 — ABNORMAL HIGH
WBC: 15.2 — ABNORMAL HIGH
WBC: 20.9 — ABNORMAL HIGH
WBC: 7.4
WBC: 8.1
WBC: 8.8
WBC: 8.9
WBC: 9
WBC: 9.4
WBC: 5.8

## 2011-07-08 LAB — COMPREHENSIVE METABOLIC PANEL
ALT: 13
ALT: 17
ALT: 20
ALT: 11
AST: 44 — ABNORMAL HIGH
Albumin: 2.7 — ABNORMAL LOW
Albumin: 3.8
Alkaline Phosphatase: 49
Alkaline Phosphatase: 66
Alkaline Phosphatase: 90
Alkaline Phosphatase: 65
BUN: 10
BUN: 13
BUN: 29 — ABNORMAL HIGH
BUN: 7
BUN: 10
CO2: 22
CO2: 25
CO2: 26
Calcium: 7.7 — ABNORMAL LOW
Calcium: 8.4
Chloride: 101
Chloride: 102
Chloride: 105
Creatinine, Ser: 0.95
GFR calc Af Amer: >60
GFR calc non Af Amer: 59 — ABNORMAL LOW
GFR calc non Af Amer: >60
GFR calc non Af Amer: >60
Glucose, Bld: 109 — ABNORMAL HIGH
Glucose, Bld: 131 — ABNORMAL HIGH
Glucose, Bld: 140 — ABNORMAL HIGH
Glucose, Bld: 262 — ABNORMAL HIGH
Glucose, Bld: 121 — ABNORMAL HIGH
Potassium: 3.4 — ABNORMAL LOW
Potassium: 3.8
Potassium: 4.2
Potassium: 3.7
Sodium: 136
Sodium: 137
Sodium: 140
Total Bilirubin: 0.5
Total Bilirubin: 1.3 — ABNORMAL HIGH
Total Bilirubin: 0.7
Total Protein: 5.2 — ABNORMAL LOW
Total Protein: 5.2 — ABNORMAL LOW
Total Protein: 5.9 — ABNORMAL LOW
Total Protein: 6.6

## 2011-07-08 LAB — DIFFERENTIAL
Basophils Absolute: 0.1
Basophils Absolute: 0
Basophils Absolute: 0
Basophils Relative: 0
Basophils Relative: 0
Basophils Relative: 1
Basophils Relative: 0
Basophils Relative: 1
Eosinophils Absolute: 0.1 — ABNORMAL LOW
Eosinophils Absolute: 0.1 — ABNORMAL LOW
Eosinophils Absolute: 0.1 — ABNORMAL LOW
Eosinophils Relative: 0
Eosinophils Relative: 1
Eosinophils Relative: 1
Eosinophils Relative: 1
Eosinophils Relative: 1
Lymphocytes Relative: 10 — ABNORMAL LOW
Lymphocytes Relative: 18
Lymphocytes Relative: 4 — ABNORMAL LOW
Lymphocytes Relative: 13
Lymphs Abs: 1.3
Lymphs Abs: 1.2
Monocytes Absolute: 0.5
Monocytes Absolute: 0.8
Monocytes Absolute: 0.5
Monocytes Absolute: 0.6
Monocytes Absolute: 0.8
Monocytes Relative: 12
Monocytes Relative: 4
Monocytes Relative: 8
Monocytes Relative: 12
Monocytes Relative: 8
Neutro Abs: 3.9
Neutro Abs: 5.4
Neutrophils Relative %: 80 — ABNORMAL HIGH
Neutrophils Relative %: 81 — ABNORMAL HIGH
Neutrophils Relative %: 92 — ABNORMAL HIGH
Smear Review: ADEQUATE

## 2011-07-08 LAB — CROSSMATCH
ABO/RH(D): A POS
Antibody Screen: NEGATIVE

## 2011-07-08 LAB — POCT CARDIAC MARKERS
CKMB, poc: 1.4
Myoglobin, poc: 139
Operator id: 151321
Troponin i, poc: <0.05

## 2011-07-08 LAB — CARDIAC PANEL(CRET KIN+CKTOT+MB+TROPI)
CK, MB: 2
CK, MB: 232.4 — ABNORMAL HIGH
Relative Index: 12.8 — ABNORMAL HIGH
Relative Index: 6 — ABNORMAL HIGH
Relative Index: 7.5 — ABNORMAL HIGH
Relative Index: 9.1 — ABNORMAL HIGH
Relative Index: INVALID
Relative Index: INVALID
Total CK: 2060 — ABNORMAL HIGH
Total CK: 262 — ABNORMAL HIGH
Total CK: 2983 — ABNORMAL HIGH
Total CK: 318 — ABNORMAL HIGH
Total CK: 823 — ABNORMAL HIGH
Total CK: 88
Troponin I: 1.32
Troponin I: 5.18
Troponin I: 5.84
Troponin I: 89.05
Troponin I: >100
Troponin I: >100

## 2011-07-08 LAB — TISSUE TRANSGLUTAMINASE, IGA: Tissue Transglutaminase Ab, IgA: 0 U/mL (ref <7)

## 2011-07-08 LAB — CULTURE, BAL-QUANTITATIVE W GRAM STAIN: Colony Count: 50000

## 2011-07-08 LAB — POCT I-STAT 3, ART BLOOD GAS (G3+)
Acid-Base Excess: 5 — ABNORMAL HIGH
O2 Saturation: 100
O2 Saturation: 50
O2 Saturation: 96
Operator id: 118031
Operator id: 208821
Patient temperature: 37
TCO2: 24
TCO2: 31
TCO2: 31
pCO2 arterial: 38.7
pCO2 arterial: 41.9
pCO2 arterial: 42.6
pH, Arterial: 7.335 — ABNORMAL LOW
pH, Arterial: 7.371
pH, Arterial: 7.441
pO2, Arterial: 29 — CL

## 2011-07-08 LAB — URINE MICROSCOPIC-ADD ON

## 2011-07-08 LAB — POCT I-STAT CREATININE: Creatinine, Ser: 1.1

## 2011-07-08 LAB — LACTATE DEHYDROGENASE
LDH: 273 — ABNORMAL HIGH
LDH: 405 — ABNORMAL HIGH
LDH: 427 — ABNORMAL HIGH

## 2011-07-08 LAB — HEMOGLOBIN AND HEMATOCRIT, BLOOD
HCT: 35.4 — ABNORMAL LOW
Hemoglobin: 10.1 — ABNORMAL LOW
Hemoglobin: 7.8 — CL
Hemoglobin: 11 — ABNORMAL LOW

## 2011-07-08 LAB — LIPID PANEL
Cholesterol: 124
Cholesterol: 137
Cholesterol: 104
HDL: 36 — ABNORMAL LOW
HDL: 30 — ABNORMAL LOW
LDL Cholesterol: 70
Total CHOL/HDL Ratio: 3.4
Total CHOL/HDL Ratio: 5.1
Triglycerides: 92
VLDL: 18

## 2011-07-08 LAB — IRON AND TIBC: Iron: 12 — ABNORMAL LOW

## 2011-07-08 LAB — PREPARE FRESH FROZEN PLASMA

## 2011-07-08 LAB — PROTEIN ELECTROPH W RFLX QUANT IMMUNOGLOBULINS
Albumin ELP: 50.2 — ABNORMAL LOW
Beta 2: 5.1
Beta Globulin: 7.5 — ABNORMAL HIGH

## 2011-07-08 LAB — I-STAT 8, (EC8 V) (CONVERTED LAB)
BUN: 10
Bicarbonate: 19.3 — ABNORMAL LOW
HCT: 39
Hemoglobin: 13.3
Operator id: 151321
Sodium: 135
TCO2: 20
pCO2, Ven: 33.2 — ABNORMAL LOW

## 2011-07-08 LAB — CK TOTAL AND CKMB (NOT AT ARMC)
CK, MB: 15.6 — ABNORMAL HIGH
Relative Index: 6 — ABNORMAL HIGH

## 2011-07-08 LAB — PREPARE PLATELET PHERESIS

## 2011-07-08 LAB — URINE CULTURE: Special Requests: NEGATIVE

## 2011-07-08 LAB — PROTIME-INR
INR: 1.3
INR: 1.4
Prothrombin Time: 13.5
Prothrombin Time: 15.3 — ABNORMAL HIGH
Prothrombin Time: 17.9 — ABNORMAL HIGH

## 2011-07-08 LAB — CULTURE, BLOOD (ROUTINE X 2): Culture: NO GROWTH

## 2011-07-08 LAB — TYPE AND SCREEN

## 2011-07-08 LAB — RETICULOCYTES
RBC.: 4.05 — ABNORMAL LOW
Retic Ct Pct: 1.9

## 2011-07-08 LAB — VITAMIN B12: Vitamin B-12: 135 — ABNORMAL LOW (ref 211–911)

## 2011-07-08 LAB — HEPARIN LEVEL (UNFRACTIONATED)
Heparin Unfractionated: 0.16 — ABNORMAL LOW
Heparin Unfractionated: 0.2 — ABNORMAL LOW
Heparin Unfractionated: 0.28 — ABNORMAL LOW
Heparin Unfractionated: 0.36
Heparin Unfractionated: 0.39
Heparin Unfractionated: 0.71 — ABNORMAL HIGH
Heparin Unfractionated: 1.73 — ABNORMAL HIGH
Heparin Unfractionated: <0.1 — ABNORMAL LOW
Heparin Unfractionated: <0.1 — ABNORMAL LOW

## 2011-07-08 LAB — ANTIBODY SCREEN: Antibody Screen: NEGATIVE

## 2011-07-08 LAB — CREATININE, SERUM: GFR calc Af Amer: >60

## 2011-07-08 LAB — ERYTHROPOIETIN: Erythropoietin: 39.4 — ABNORMAL HIGH

## 2011-07-08 LAB — TSH: TSH: 1.214

## 2011-07-08 LAB — APTT
aPTT: 45 — ABNORMAL HIGH
aPTT: 50 — ABNORMAL HIGH
aPTT: 29

## 2011-07-08 LAB — URINALYSIS, ROUTINE W REFLEX MICROSCOPIC
Nitrite: NEGATIVE
Specific Gravity, Urine: 1.019
Urobilinogen, UA: 0.2

## 2011-07-08 LAB — ABO/RH: ABO/RH(D): A POS

## 2011-07-08 LAB — INTRINSIC FACTOR ANTIBODIES: Intrinsic Factor: NEGATIVE

## 2011-07-08 LAB — SAVE SMEAR

## 2011-07-08 LAB — HAPTOGLOBIN: Haptoglobin: 338 — ABNORMAL HIGH

## 2011-07-08 LAB — FOLATE RBC: RBC Folate: 612 — ABNORMAL HIGH

## 2011-07-08 LAB — MISCELLANEOUS TEST

## 2011-07-08 LAB — B-NATRIURETIC PEPTIDE (CONVERTED LAB): Pro B Natriuretic peptide (BNP): 1294 — ABNORMAL HIGH

## 2011-08-01 ENCOUNTER — Other Ambulatory Visit: Payer: Self-pay | Admitting: Cardiology

## 2011-08-01 DIAGNOSIS — I6529 Occlusion and stenosis of unspecified carotid artery: Secondary | ICD-10-CM

## 2011-08-04 ENCOUNTER — Encounter (INDEPENDENT_AMBULATORY_CARE_PROVIDER_SITE_OTHER): Payer: Medicare Other | Admitting: *Deleted

## 2011-08-04 DIAGNOSIS — I6529 Occlusion and stenosis of unspecified carotid artery: Secondary | ICD-10-CM

## 2011-08-18 ENCOUNTER — Telehealth: Payer: Self-pay | Admitting: Cardiology

## 2011-08-18 NOTE — Telephone Encounter (Signed)
New problem Pt said his legs are hurting all the time. He wants to talk to you

## 2011-08-18 NOTE — Telephone Encounter (Signed)
Returned pt call about legs hurting.  He is needing redo knee replacement. Pt says his legs are hurting all of the time and "I don't understand why I can't have the surgery" Pt denies any cardiac complaints.  He has follow-up appt with Dr. Daleen Squibb on 09/04/11 Mylo Red RN

## 2011-08-19 NOTE — Telephone Encounter (Signed)
Error

## 2011-08-26 ENCOUNTER — Telehealth: Payer: Self-pay | Admitting: *Deleted

## 2011-08-26 NOTE — Telephone Encounter (Signed)
Dr. Daleen Squibb has talked with Dr. [name] today about surgical clearance. He felt pt was too high surgical risk and did not clear him for surgery from a cardiac standpoint. Dr. Charm Barges is aware of this. Mylo Red RN

## 2011-09-04 ENCOUNTER — Ambulatory Visit (INDEPENDENT_AMBULATORY_CARE_PROVIDER_SITE_OTHER): Payer: Medicare Other | Admitting: Cardiology

## 2011-09-04 ENCOUNTER — Encounter: Payer: Self-pay | Admitting: Cardiology

## 2011-09-04 VITALS — BP 124/74 | HR 60 | Ht 74.0 in | Wt 204.0 lb

## 2011-09-04 DIAGNOSIS — Z951 Presence of aortocoronary bypass graft: Secondary | ICD-10-CM

## 2011-09-04 DIAGNOSIS — E119 Type 2 diabetes mellitus without complications: Secondary | ICD-10-CM

## 2011-09-04 DIAGNOSIS — I739 Peripheral vascular disease, unspecified: Secondary | ICD-10-CM

## 2011-09-04 DIAGNOSIS — I679 Cerebrovascular disease, unspecified: Secondary | ICD-10-CM

## 2011-09-04 DIAGNOSIS — I2589 Other forms of chronic ischemic heart disease: Secondary | ICD-10-CM

## 2011-09-04 DIAGNOSIS — I10 Essential (primary) hypertension: Secondary | ICD-10-CM

## 2011-09-04 DIAGNOSIS — I509 Heart failure, unspecified: Secondary | ICD-10-CM

## 2011-09-04 DIAGNOSIS — I080 Rheumatic disorders of both mitral and aortic valves: Secondary | ICD-10-CM

## 2011-09-04 DIAGNOSIS — N189 Chronic kidney disease, unspecified: Secondary | ICD-10-CM

## 2011-09-04 DIAGNOSIS — Z95810 Presence of automatic (implantable) cardiac defibrillator: Secondary | ICD-10-CM

## 2011-09-04 DIAGNOSIS — I2510 Atherosclerotic heart disease of native coronary artery without angina pectoris: Secondary | ICD-10-CM

## 2011-09-04 DIAGNOSIS — I4891 Unspecified atrial fibrillation: Secondary | ICD-10-CM

## 2011-09-04 NOTE — Progress Notes (Signed)
HPI [name] comes in today to ask about clear him for a repeat left knee replacement. His old prosthesis has been there for 15 years. He has a lot of pain but is only taking one half of Percocet every 4 hours he is worried about becoming addicted.  He has multiple vascular problems including neurological, cardiovascular, and peripheral vascular disease. He also has LV systolic dysfunction and has a defibrillator. I have explained to him before and also to his surgeon that I would not clear him for surgery. He is just too high risk.  Remarkably, he drove himself to the office today. He says he drives himself everywhere.  His weight has been stable. He denies any orthopnea, PND or increased edema. He has had no obvious chest pain or angina.    Past Medical History  Diagnosis Date  . Cerebrovascular disease   . Anemia, iron deficiency   . Sinus bradycardia   . Tobacco abuse   . Arm pain   . Chronic renal insufficiency   . Hypertension   . Osteoarthritis   . Diabetes mellitus   . Anemia   . Mitral regurgitation   . Systolic CHF, chronic   . PVD (peripheral vascular disease)   . Cardiomyopathy, ischemic   . Hx of CABG   . Heart attack   . CAD (coronary artery disease)   . Unstable angina   . Stroke     Current Outpatient Prescriptions  Medication Sig Dispense Refill  . aspirin 325 MG tablet Take 325 mg by mouth daily.        . baclofen (LIORESAL) 10 MG tablet Take 40 mg by mouth daily.        . carvedilol (COREG) 12.5 MG tablet TAKE ONE TABLET BY MOUTH TWICE DAILY  60 tablet  6  . clopidogrel (PLAVIX) 75 MG tablet Take 75 mg by mouth daily.        [name] docusate sodium (COLACE) 100 MG capsule Take 100 mg by mouth 2 (two) times daily.        . furosemide (LASIX) 40 MG tablet Take 1 tablet (40 mg total) by mouth as directed.  30 tablet  4  . glimepiride (AMARYL) 4 MG tablet Take 4 mg by mouth daily before breakfast.        . lisinopril (PRINIVIL,ZESTRIL) 10 MG tablet TAKE ONE  TABLET BY MOUTH TWICE DAILY  180 tablet  3  . metFORMIN (GLUCOPHAGE-XR) 500 MG 24 hr tablet Take 500 mg by mouth 2 (two) times daily.        . nitroGLYCERIN (NITROSTAT) 0.4 MG SL tablet Place 0.4 mg under the tongue as directed.        [name] oxyCODONE-acetaminophen (PERCOCET) 5-325 MG per tablet Take 1 tablet by mouth every 4 (four) hours as needed.        [name] spironolactone (ALDACTONE) 25 MG tablet Take 1 tablet (25 mg total) by mouth daily.  30 tablet  11  . atorvastatin (LIPITOR) 80 MG tablet Take 40 mg by mouth daily.         Allergies  Allergen Reactions  . Codeine     REACTION: G I  distress    Family History  Problem Relation Age of Onset  . Heart attack Mother     CVA, MI  . Diabetes Mother   . Heart attack Father     MI  . Diabetes Sister   . Coronary artery disease Brother     CABG  History   Social History  . Marital Status: Married    Spouse Name: N/A    Number of Children: N/A  . Years of Education: N/A   Occupational History  . Retired    Social History Main Topics  . Smoking status: Former Smoker -- 1.0 packs/day for 15 years    Quit date: 09/29/1990  . Smokeless tobacco: Never Used  . Alcohol Use: No  . Drug Use: No  . Sexually Active: Not on file   Other Topics Concern  . Not on file   Social History Narrative   [place]ives in [place] with wifeBeen on disability since his stroke in the 1990sNot routinely exercising    ROS ALL NEGATIVE EXCEPT THOSE NOTED IN HPI  PE  General Appearance: well developed, well nourished in no acute distress, chronically ill appearing HEENT:asymmetrical face, PERRLA, Poor dentition Neck: no JVD, thyromegaly, or adenopathy, trachea midline Chest: symmetric without deformity Cardiac: PMI Displaced inferolaterally RRR, normal S1, S2, no gallop or murmur Lung: clear to ausculation and percussion Vascular: Pulses difficult to palpate in his feet and both feet are cold. Decreased capillary refill, bilateral carotid  bruits Abdominal: nondistended, nontender, good bowel sounds, no HSM, no bruits Extremities: no cyanosis, clubbing or edema, no sign of DVT, no varicosities  Skin: normal color, no rashes Neuro: alert and oriented x 3, non-focal Pysch: normal affect  EKG Biventricular pacing BMET    Component Value Date/Time   NA 141 06/25/2011 1307   NA 141 06/25/2011 1307   NA 141 06/25/2011 1307   K 4.7 06/25/2011 1307   K 4.7 06/25/2011 1307   K 4.7 06/25/2011 1307   CL 106 06/25/2011 1307   CL 106 06/25/2011 1307   CL 106 06/25/2011 1307   CO2 25 06/25/2011 1307   CO2 25 06/25/2011 1307   CO2 25 06/25/2011 1307   GLUCOSE 186* 06/25/2011 1307   GLUCOSE 186* 06/25/2011 1307   GLUCOSE 186* 06/25/2011 1307   BUN 20 06/25/2011 1307   BUN 20 06/25/2011 1307   BUN 20 06/25/2011 1307   CREATININE 1.31 06/25/2011 1307   CREATININE 1.31 06/25/2011 1307   CREATININE 1.31 06/25/2011 1307   CALCIUM 8.7 06/25/2011 1307   CALCIUM 8.7 06/25/2011 1307   CALCIUM 8.7 06/25/2011 1307   GFRNONAA 45* 02/27/2010 0414   GFRAA  Value: 55        The eGFR has been calculated using the MDRD equation. This calculation has not been validated in all clinical situations. eGFR's persistently <60 mL/min signify possible Chronic Kidney Disease.* 02/27/2010 0414    Lipid Panel     Component Value Date/Time   CHOL  Value: 78        ATP III CLASSIFICATION:  <200     mg/dL   Desirable  960-454  mg/dL   Borderline High  >=098    mg/dL   High        09/29/9145 0414   TRIG 50 02/27/2010 0414   HDL 30* 02/27/2010 0414   CHOLHDL 2.6 02/27/2010 0414   VLDL 10 02/27/2010 0414   LDLCALC  Value: 38        Total Cholesterol/HDL:CHD Risk Coronary Heart Disease Risk Table                     Men   Women  1/2 Average Risk   3.4   3.3  Average Risk       5.0   4.4  2 X Average  Risk   9.6   7.1  3 X Average Risk  23.4   11.0        Use the calculated Patient Ratio above and the CHD Risk Table to determine the patient's CHD Risk.        ATP III CLASSIFICATION (LDL):  <100      mg/dL   Optimal  161-096  mg/dL   Near or Above                    Optimal  130-159  mg/dL   Borderline  045-409  mg/dL   High  >811     mg/dL   Very High 05/30/4781 9562    CBC    Component Value Date/Time   WBC 7.4 06/25/2011 1307   WBC 7.4 02/26/2010 1551   RBC 4.74 06/25/2011 1307   RBC 3.81* 02/26/2010 1551   HGB 14.6 06/25/2011 1307   HGB 12.3* 02/26/2010 1551   HCT 43.5 06/25/2011 1307   HCT 35.9* 02/26/2010 1551   PLT 207 06/25/2011 1307   PLT 154 02/26/2010 1551   MCV 91.7 06/25/2011 1307   MCV 94.3 02/26/2010 1551   MCH 30.9 06/25/2011 1307   MCHC 33.7 06/25/2011 1307   MCHC 34.3 02/26/2010 1551   RDW 14.9* 06/25/2011 1307   RDW 15.0 02/26/2010 1551   LYMPHSABS 1.5 06/25/2011 1307   LYMPHSABS 1.4 02/26/2010 1551   MONOABS 0.6 06/25/2011 1307   MONOABS 0.6 02/26/2010 1551   EOSABS 0.1 06/25/2011 1307   EOSABS 0.4 02/26/2010 1551   BASOSABS 0.0 06/25/2011 1307   BASOSABS 0.0 02/26/2010 1551

## 2011-09-04 NOTE — Patient Instructions (Signed)
Your physician recommends that you schedule a follow-up appointment with Dr. Daleen Squibb in 6 months.  Increase your Percocet dose 1 tab every 4 hours.

## 2011-09-04 NOTE — Assessment & Plan Note (Signed)
Stable. Continue current medical therapy. I have not cleared him for general anesthesia and redo left knee replacement. His risk is too high or preoperative infarct stopping his antiplatelet therapy, intraoperative complications, and postoperative complications. He is only taking one half a Percocet every 4 hours. He still gets around well enough to drive.  I have asked him to  increase his Percocet one tablet every 4 hours.

## 2011-09-04 NOTE — Assessment & Plan Note (Signed)
Stable. Continue current medical therapy. 

## 2011-09-26 ENCOUNTER — Other Ambulatory Visit: Payer: Medicare Other | Admitting: Lab

## 2011-10-01 ENCOUNTER — Telehealth: Payer: Self-pay | Admitting: Cardiology

## 2011-10-01 ENCOUNTER — Other Ambulatory Visit: Payer: Self-pay | Admitting: Cardiology

## 2011-10-01 MED ORDER — CLOPIDOGREL BISULFATE 75 MG PO TABS: 75.0000 mg | ORAL_TABLET | Freq: Every day | ORAL | Status: DC

## 2011-10-01 NOTE — Telephone Encounter (Signed)
Pt needs refill on plavix 75mg  qd called into [facility] in [place]

## 2011-10-06 ENCOUNTER — Other Ambulatory Visit: Payer: Self-pay | Admitting: Medical Oncology

## 2011-10-06 DIAGNOSIS — D509 Iron deficiency anemia, unspecified: Secondary | ICD-10-CM

## 2011-10-07 ENCOUNTER — Other Ambulatory Visit (HOSPITAL_BASED_OUTPATIENT_CLINIC_OR_DEPARTMENT_OTHER): Payer: Medicare Other

## 2011-10-07 DIAGNOSIS — D509 Iron deficiency anemia, unspecified: Secondary | ICD-10-CM

## 2011-10-07 LAB — CBC WITH DIFFERENTIAL/PLATELET
Basophils Absolute: 0 10*3/uL (ref 0.0–0.1)
HCT: 42.4 % (ref 38.4–49.9)
MONO#: 0.6 10*3/uL (ref 0.1–0.9)
NEUT%: 68.4 % (ref 39.0–75.0)
lymph#: 1.4 10*3/uL (ref 0.9–3.3)

## 2011-11-03 ENCOUNTER — Telehealth: Payer: Self-pay | Admitting: Oncology

## 2011-11-03 NOTE — Telephone Encounter (Signed)
l/m on vm with 4/8 appt and mailed out   aom

## 2011-11-19 ENCOUNTER — Telehealth: Payer: Self-pay | Admitting: Internal Medicine

## 2011-11-19 NOTE — Telephone Encounter (Signed)
Patient called in and wants to have knee replacement.  Will need to have both knees done.  Dr Daleen Squibb has told him no to the surgery.  He doesn't take medications at this point.  He was taking them every 4-6 hours but quit 1 month ago.  Mornings are better, just needs some relief.  Does not even have an appointment with an orthopedic. I told him he needs to get an appointment with ortho first and see what they can do to help other than surgery.  He says he is hurting so bad.  I explained to him that Dr Vern Claude office note said no to surgery.  I told him to call his ortho and see if there is anything else they can do other than surgery.

## 2011-11-19 NOTE — Telephone Encounter (Signed)
New Msg: Pt calling wanting to speak to MD about pt getting knee replaced. Please return pt call to discuss further.

## 2011-12-15 ENCOUNTER — Telehealth: Payer: Self-pay | Admitting: Oncology

## 2011-12-15 NOTE — Telephone Encounter (Signed)
called pt and informed him to come at 10am for his appt on 04/08

## 2011-12-15 NOTE — Telephone Encounter (Signed)
pt called and l/m t6o verify appts,called pt back and lm with info     aom

## 2011-12-18 ENCOUNTER — Other Ambulatory Visit: Payer: Self-pay | Admitting: Cardiology

## 2011-12-19 ENCOUNTER — Other Ambulatory Visit: Payer: Self-pay | Admitting: Cardiology

## 2011-12-19 MED ORDER — CARVEDILOL 12.5 MG PO TABS: 12.5000 mg | ORAL_TABLET | Freq: Two times a day (BID) | ORAL | Status: DC

## 2011-12-19 NOTE — Telephone Encounter (Signed)
Rx Refill- arvedilol (COREG) 12.5 MG tablet              -atorvastatin (LIPITOR) 80 MG tablet  (patient would like Lipitor changed to 40 MG)  [facility]    Patient can be reached at hm# [phone] for additional information

## 2012-01-05 ENCOUNTER — Other Ambulatory Visit: Payer: Medicare Other | Admitting: Lab

## 2012-01-05 ENCOUNTER — Other Ambulatory Visit (HOSPITAL_COMMUNITY): Payer: Self-pay | Admitting: Oncology

## 2012-01-05 ENCOUNTER — Ambulatory Visit (HOSPITAL_BASED_OUTPATIENT_CLINIC_OR_DEPARTMENT_OTHER): Payer: Medicare Other | Admitting: Physician Assistant

## 2012-01-05 ENCOUNTER — Ambulatory Visit: Payer: Medicare Other | Admitting: Oncology

## 2012-01-05 ENCOUNTER — Telehealth: Payer: Self-pay | Admitting: Oncology

## 2012-01-05 VITALS — BP 122/72 | HR 78 | Temp 96.9°F | Ht 74.0 in | Wt 212.1 lb

## 2012-01-05 DIAGNOSIS — D649 Anemia, unspecified: Secondary | ICD-10-CM

## 2012-01-05 DIAGNOSIS — N289 Disorder of kidney and ureter, unspecified: Secondary | ICD-10-CM

## 2012-01-05 DIAGNOSIS — E538 Deficiency of other specified B group vitamins: Secondary | ICD-10-CM

## 2012-01-05 DIAGNOSIS — D509 Iron deficiency anemia, unspecified: Secondary | ICD-10-CM

## 2012-01-05 LAB — CBC WITH DIFFERENTIAL/PLATELET
Basophils Absolute: 0 10*3/uL (ref 0.0–0.1)
Eosinophils Absolute: 0.2 10*3/uL (ref 0.0–0.5)
HGB: 14.5 g/dL (ref 13.0–17.1)
LYMPH%: 17.8 % (ref 14.0–49.0)
MCV: 90.6 fL (ref 79.3–98.0)
MONO#: 0.7 10*3/uL (ref 0.1–0.9)
MONO%: 9.1 % (ref 0.0–14.0)
NEUT#: 5.5 10*3/uL (ref 1.5–6.5)
Platelets: 185 10*3/uL (ref 140–400)
RDW: 13.6 % (ref 11.0–14.6)
WBC: 7.9 10*3/uL (ref 4.0–10.3)

## 2012-01-05 NOTE — Telephone Encounter (Signed)
pts made and printed for pt aom °

## 2012-01-05 NOTE — Progress Notes (Signed)
[facility] OFFICE PROGRESS NOTE  [name],CYNTHIA, DO, DO   INTERVAL HISTORY::  [name] returns to clinic for followup of his anemia, felt to be due to iron deficiency and vitamin B12 deficiency, as well as some degree of renal insufficiency.  Since his last clinic visit in March 2012, he reports some intermittent fatigue, but is able to complete ADLs.  He states his biggest issue is right  knee pain, which he controls with p.r.n. oxycodone.  He states that he really needs knee replacement, but no clearance is available per cardiologist due to the possibility of complications during surgery.  He is having no issues with fevers, chills, or night sweats.  No dyspnea or cough.  He does note normal appetite and has had no nausea, vomiting, constipation, or diarrhea.  No difficulty urinating.  He also notes lower extremity swelling bilaterally.  This dissipates with elevation of extremities.  He is not having any calf pain. He has noted some issues remembering medications schedules. He has not been taking B12 on a daily basis, nor has been taking Iron pills. His last Ferritin on 9/4 was 49, with B 12 levels of 435. On January 04/2012 B12 was 324. LDH was 168 in September 2012. Last H/H on 10/07/11 was 42.4/93, nl WBC 7.9 and plts 189. MCV was 93. Today, H/H 15.5/43.2, WBC 7.9 and plts 185 with MCV 90.6. Iron studies, Ferritin, LDH and B12 are pending.    MEDICAL HISTORY: Past Medical History  Diagnosis Date  . Cerebrovascular disease   . Anemia, iron deficiency   . Sinus bradycardia   . Tobacco abuse   . Arm pain   . Chronic renal insufficiency   . Hypertension   . Osteoarthritis   . Diabetes mellitus   . Anemia   . Mitral regurgitation   . Systolic CHF, chronic   . PVD (peripheral vascular disease)   . Cardiomyopathy, ischemic   . Hx of CABG   . Heart attack   . CAD (coronary artery disease)   . Unstable angina   . Stroke     SURGICAL HISTORY:  Past Surgical History    Procedure Date  . Coronary artery bypass graft   . Percutaneous coronary intervention 01/10/2005    [name] Constable, MD  . Coronary artery bypass graft 08/29/2007    x3; [name] Decent. Cornelius Moras MD  . Cardiac defibrillator placement 04/05/2008    Implantation of a [employer] Jude dual-chamber defibrillator, [name] , MD    MEDICATIONS: Current Outpatient Prescriptions  Medication Sig Dispense Refill  . aspirin 325 MG tablet Take 325 mg by mouth daily.        [name] atorvastatin (LIPITOR) 80 MG tablet TAKE ONE TABLET BY MOUTH EVERY DAY  30 tablet  6  . baclofen (LIORESAL) 10 MG tablet Take 40 mg by mouth daily.        . carvedilol (COREG) 12.5 MG tablet Take 1 tablet (12.5 mg total) by mouth 2 (two) times daily with a meal.  60 tablet  6  . clopidogrel (PLAVIX) 75 MG tablet Take 1 tablet (75 mg total) by mouth daily.  30 tablet  3  . docusate sodium (COLACE) 100 MG capsule Take 100 mg by mouth 2 (two) times daily.        . furosemide (LASIX) 40 MG tablet Take 1 tablet (40 mg total) by mouth as directed.  30 tablet  4  . glimepiride (AMARYL) 4 MG tablet Take 4 mg by mouth  daily before breakfast.        . lisinopril (PRINIVIL,ZESTRIL) 10 MG tablet TAKE ONE TABLET BY MOUTH TWICE DAILY  180 tablet  3  . metFORMIN (GLUCOPHAGE-XR) 500 MG 24 hr tablet Take 500 mg by mouth 2 (two) times daily.        . nitroGLYCERIN (NITROSTAT) 0.4 MG SL tablet Place 0.4 mg under the tongue as directed.        [name] oxyCODONE-acetaminophen (PERCOCET) 5-325 MG per tablet Take 1 tablet by mouth every 4 (four) hours as needed.        [name] spironolactone (ALDACTONE) 25 MG tablet Take 1 tablet (25 mg total) by mouth daily.  30 tablet  11    ALLERGIES:  is allergic to codeine.  REVIEW OF SYSTEMS:  The rest of the 14-point review of system was negative.   Filed Vitals:   01/05/12 1002  BP: 122/72  Pulse: 78  Temp: 96.9 F (36.1 C)   Wt Readings from Last 3 Encounters:  01/05/12 212 lb 1.6 oz (96.208 kg)  09/04/11 204 lb (92.534  kg)  03/25/11 203 lb (92.08 kg)     PHYSICAL EXAMINATION:    General:  This is a well-developed, well-nourished white male in no acute distress.  HEENT:  Sclerae are nonicteric.  There is no oral thrush or mucositis. Tongue smooth.  Skin:  Without rashes or lesions.  Lymph:  No cervical, supraclavicular, axillary, or inguinal lymphadenopathy.  Cardiac:  Reveals regular rate and rhythm without murmurs or gallops.  Peripheral pulses are 2+.  Chest:  Lungs are clear to auscultation.  Abdomen:  Positive bowel sounds, soft, nontender, nondistended.  There is no organomegaly.  Extremities:  Reveal bilateral  2 +dependent lower extremity edema without cyanosis or calf tenderness.  Neuro:  Alert and oriented x3 with right-sided hemiparesis noted.     LABORATORY/RADIOLOGY DATA:   Lab 01/05/12 0946  WBC 7.9  HGB 14.5  HCT 43.2  PLT 185  MCV 90.6  MCH 30.4  MCHC 33.6  RDW 13.6  LYMPHSABS 1.4  MONOABS 0.7  EOSABS 0.2  BASOSABS 0.0  BANDABS --    CMP   No results found for this basename: NA:5,K:5,CL:5,CO2:5,GLUCOSE:5,BUN:5,CREATININE:5,GFRCGP,:5,CALCIUM:5,MG:5,AST:5,ALT:5,ALKPHOS:5,BILITOT:5 in the last 168 hours      Component Value Date/Time   BILITOT 0.4 06/25/2011 1307   BILITOT 0.4 06/25/2011 1307   BILITOT 0.4 06/25/2011 1307   BILIDIR 0.1 10/26/2007 1049     Radiology Studies:  No results found.     ASSESSMENT AND PLAN:  [name] is a 66 year old white male with multifactorial anemia, felt to be due to deficiency, vitamin B12 deficiency, as well as renal insufficiency. He had been instructed to be maintained on oral B12 and his last vitamin B12 . However, he has failed to take oral B12 . Current levels are pending. He will be informed of the results and further instructions are to proceed.  We will plan to reassess CBC with diff and vitamin B12 level in 3 months' time and the patient will have followup with Dr. Arline Asp in 6 months' time, at which time we will  reassess CBC with diff, CMET, LDH, ferritin, iron IBC, vitamin B12 level, and transferrin receptor.  The patient is instructed to call in the interim if any questions or problems.  Also of note, once the patient's current iron studies are reviewed, we may be able to delete the interim lab check.  The patient will be notified of this if Dr. Arline Asp decides he is  not required to have an interval lab check.

## 2012-01-08 ENCOUNTER — Other Ambulatory Visit: Payer: Self-pay | Admitting: Oncology

## 2012-01-09 ENCOUNTER — Telehealth: Payer: Self-pay | Admitting: Medical Oncology

## 2012-01-09 ENCOUNTER — Encounter: Payer: Self-pay | Admitting: Medical Oncology

## 2012-01-09 LAB — COMPREHENSIVE METABOLIC PANEL
AST: 14 U/L (ref 0–37)
Albumin: 4.7 g/dL (ref 3.5–5.2)
Alkaline Phosphatase: 74 U/L (ref 39–117)
BUN: 43 mg/dL — ABNORMAL HIGH (ref 6–23)
Calcium: 9.5 mg/dL (ref 8.4–10.5)
Chloride: 102 mEq/L (ref 96–112)
Potassium: 4.9 mEq/L (ref 3.5–5.3)
Sodium: 140 mEq/L (ref 135–145)
Total Protein: 6.7 g/dL (ref 6.0–8.3)

## 2012-01-09 LAB — VITAMIN B12: Vitamin B-12: 278 pg/mL (ref 211–911)

## 2012-01-09 LAB — TRANSFERRIN RECEPTOR, SOLUABLE: Transferrin Receptor, Soluble: 1.76 mg/L (ref 0.76–1.76)

## 2012-01-09 LAB — IRON AND TIBC: UIBC: 255 ug/dL (ref 125–400)

## 2012-01-09 NOTE — Telephone Encounter (Signed)
I called pt per Dr. Arline Asp to let him know that his Vit B12 and ferritin are low. Dr. Arline Asp would like for him to take Vit B 12 1000 mcg daily and Iron 325 mg BID. I explained that he needs to take with a meal and to watch for constipation. He voiced understanding.

## 2012-01-13 ENCOUNTER — Encounter: Payer: Self-pay | Admitting: Oncology

## 2012-01-13 NOTE — Progress Notes (Signed)
Patient called by RB on 01/09/12 and instructed to take oral Vit B12 1000 mcg daily and FeSO4 325 mg bid.

## 2012-01-26 ENCOUNTER — Other Ambulatory Visit: Payer: Self-pay | Admitting: Cardiology

## 2012-02-13 ENCOUNTER — Ambulatory Visit: Payer: Medicare Other | Admitting: Physician Assistant

## 2012-02-22 ENCOUNTER — Other Ambulatory Visit: Payer: Self-pay | Admitting: Cardiology

## 2012-03-05 ENCOUNTER — Ambulatory Visit (INDEPENDENT_AMBULATORY_CARE_PROVIDER_SITE_OTHER): Payer: Medicare Other | Admitting: Cardiology

## 2012-03-05 ENCOUNTER — Encounter: Payer: Self-pay | Admitting: Cardiology

## 2012-03-05 VITALS — BP 110/66 | HR 79 | Ht 74.0 in | Wt 209.0 lb

## 2012-03-05 DIAGNOSIS — I739 Peripheral vascular disease, unspecified: Secondary | ICD-10-CM

## 2012-03-05 DIAGNOSIS — I495 Sick sinus syndrome: Secondary | ICD-10-CM

## 2012-03-05 DIAGNOSIS — N189 Chronic kidney disease, unspecified: Secondary | ICD-10-CM

## 2012-03-05 DIAGNOSIS — I252 Old myocardial infarction: Secondary | ICD-10-CM

## 2012-03-05 DIAGNOSIS — Z95810 Presence of automatic (implantable) cardiac defibrillator: Secondary | ICD-10-CM

## 2012-03-05 DIAGNOSIS — I080 Rheumatic disorders of both mitral and aortic valves: Secondary | ICD-10-CM

## 2012-03-05 DIAGNOSIS — I2510 Atherosclerotic heart disease of native coronary artery without angina pectoris: Secondary | ICD-10-CM

## 2012-03-05 DIAGNOSIS — I679 Cerebrovascular disease, unspecified: Secondary | ICD-10-CM

## 2012-03-05 DIAGNOSIS — I509 Heart failure, unspecified: Secondary | ICD-10-CM

## 2012-03-05 DIAGNOSIS — I4891 Unspecified atrial fibrillation: Secondary | ICD-10-CM

## 2012-03-05 NOTE — Patient Instructions (Signed)
Your physician recommends that you continue on your current medications as directed. Please refer to the Current Medication list given to you today.  Your physician wants you to follow-up in: 6 months. You will receive a reminder letter in the mail two months in advance. If you don't receive a letter, please call our office to schedule the follow-up appointment.  Your physician has requested that you have a carotid duplex. This test is an ultrasound of the carotid arteries in your neck. It looks at blood flow through these arteries that supply the brain with blood. Allow one hour for this exam. There are no restrictions or special instructions. November 2013

## 2012-03-05 NOTE — Assessment & Plan Note (Signed)
Asymptomatic. Continue aggressive secondary preventive therapy. Reschedule carotid Dopplers in November.

## 2012-03-05 NOTE — Assessment & Plan Note (Signed)
Stable. Continue aggressive secondary preventative therapy. Return to the office in 6 months.

## 2012-03-05 NOTE — Assessment & Plan Note (Signed)
Stable. No change in medical therapy. Return the office in 6 months. 

## 2012-03-05 NOTE — Progress Notes (Signed)
HPI [name] comes in today for evaluation and management of his chronic cardiovascular disease and multiple comorbidities.  His knee pain is better and more tolerable on tramadol. Is working better than Percocet. I spoke with Dr. Magnus Ivan of orthopedic surgery and advised him not to operate because of  high risk status. Fortunately he is feeling better  He denies any angina or ischemic symptoms. His mobility is limited. He denies orthopnea, PND or edema. He's had no symptoms of TIAs or mini strokes. Blood work is being followed by Dr. Charm Barges. He does recall his hemoglobin A1c though his blood sugars run about 140 on the average.  He is very compliant with his meds.  Past Medical History  Diagnosis Date  . Cerebrovascular disease   . Anemia, iron deficiency   . Sinus bradycardia   . Tobacco abuse   . Arm pain   . Chronic renal insufficiency   . Hypertension   . Osteoarthritis   . Diabetes mellitus   . Anemia   . Mitral regurgitation   . Systolic CHF, chronic   . PVD (peripheral vascular disease)   . Cardiomyopathy, ischemic   . Hx of CABG   . Heart attack   . CAD (coronary artery disease)   . Unstable angina   . Stroke     Current Outpatient Prescriptions  Medication Sig Dispense Refill  . aspirin 325 MG tablet Take 325 mg by mouth daily.        [name] atorvastatin (LIPITOR) 80 MG tablet TAKE ONE TABLET BY MOUTH EVERY DAY  30 tablet  6  . baclofen (LIORESAL) 10 MG tablet Take 40 mg by mouth daily.        . carvedilol (COREG) 12.5 MG tablet Take 1 tablet (12.5 mg total) by mouth 2 (two) times daily with a meal.  60 tablet  6  . clopidogrel (PLAVIX) 75 MG tablet Take 1 tablet (75 mg total) by mouth daily.  30 tablet  3  . docusate sodium (COLACE) 100 MG capsule Take 100 mg by mouth 2 (two) times daily.        . furosemide (LASIX) 40 MG tablet TAKE ONE TABLET BY MOUTH AS DIRECTED  30 tablet  6  . glimepiride (AMARYL) 4 MG tablet Take 4 mg by mouth daily before breakfast.        .  lisinopril (PRINIVIL,ZESTRIL) 10 MG tablet TAKE ONE TABLET BY MOUTH TWICE DAILY  180 tablet  3  . meclizine (ANTIVERT) 25 MG tablet Take 25 mg by mouth 3 (three) times daily as needed.       . metFORMIN (GLUCOPHAGE-XR) 500 MG 24 hr tablet Take 500 mg by mouth 2 (two) times daily.        . nitroGLYCERIN (NITROSTAT) 0.4 MG SL tablet Place 0.4 mg under the tongue as directed.        [name] spironolactone (ALDACTONE) 25 MG tablet TAKE ONE TABLET BY MOUTH EVERY DAY IN THE MORNING  30 tablet  12  . traMADol (ULTRAM) 50 MG tablet Take 50 mg by mouth every 8 (eight) hours as needed.         Allergies  Allergen Reactions  . Codeine     REACTION: G I  distress    Family History  Problem Relation Age of Onset  . Heart attack Mother     CVA, MI  . Diabetes Mother   . Heart attack Father     MI  . Diabetes Sister   . Coronary  artery disease Brother     CABG    History   Social History  . Marital Status: Married    Spouse Name: N/A    Number of Children: N/A  . Years of Education: N/A   Occupational History  . Retired    Social History Main Topics  . Smoking status: Former Smoker -- 1.0 packs/day for 15 years    Quit date: 09/29/1990  . Smokeless tobacco: Never Used  . Alcohol Use: No  . Drug Use: No  . Sexually Active: Not on file   Other Topics Concern  . Not on file   Social History Narrative   [place] with wifeBeen on disability since his stroke in the 1990sNot routinely exercising    ROS ALL NEGATIVE EXCEPT THOSE NOTED IN HPI  PE  General Appearance: well developed, well nourished in no acute distress, chronically ill-appearing, neck list to the left. HEENT: symmetrical face, PERRLA, good dentition  Neck: no JVD, thyromegaly, or adenopathy, trachea midline Chest: symmetric without deformity Cardiac: PMI non-displaced, RRR, normal S1, S2, no gallop or murmur Lung: clear to ausculation and percussion Vascular: Right carotid bruit, distal pulses in the lower  extremities poorly appreciated. Reduced capillary refill. Mild dependent rubor Abdominal: nondistended, nontender, good bowel sounds, no HSM, no bruits Extremities: no cyanosis, clubbing or edema, no sign of DVT, no varicosities , no ulcerations or unhealed areas Skin: normal color, no rashes Neuro: alert and oriented x 3, non-focal Pysch: normal affect  EKG Normal sinus rhythm, increased artifact, no acute changes, old inferior Abriel Geesey infarct with low voltage BMET    Component Value Date/Time   NA 140 01/05/2012 0946   K 4.9 01/05/2012 0946   CL 102 01/05/2012 0946   CO2 29 01/05/2012 0946   GLUCOSE 142* 01/05/2012 0946   BUN 43* 01/05/2012 0946   CREATININE 1.85* 01/05/2012 0946   CALCIUM 9.5 01/05/2012 0946   GFRNONAA 45* 02/27/2010 0414   GFRAA  Value: 55        The eGFR has been calculated using the MDRD equation. This calculation has not been validated in all clinical situations. eGFR's persistently <60 mL/min signify possible Chronic Kidney Disease.* 02/27/2010 0414    Lipid Panel     Component Value Date/Time   CHOL  Value: 78        ATP III CLASSIFICATION:  <200     mg/dL   Desirable  161-096  mg/dL   Borderline High  >=045    mg/dL   High        4/0/9811 0414   TRIG 50 02/27/2010 0414   HDL 30* 02/27/2010 0414   CHOLHDL 2.6 02/27/2010 0414   VLDL 10 02/27/2010 0414   LDLCALC  Value: 38        Total Cholesterol/HDL:CHD Risk Coronary Heart Disease Risk Table                     Men   Women  1/2 Average Risk   3.4   3.3  Average Risk       5.0   4.4  2 X Average Risk   9.6   7.1  3 X Average Risk  23.4   11.0        Use the calculated Patient Ratio above and the CHD Risk Table to determine the patient's CHD Risk.        ATP III CLASSIFICATION (LDL):  <100     mg/dL   Optimal  100-129  mg/dL   Near or Above                    Optimal  130-159  mg/dL   Borderline  960-454  mg/dL   High  >098     mg/dL   Very High 09/29/9145 8295    CBC    Component Value Date/Time   WBC 7.9 01/05/2012 0946   WBC 7.4  02/26/2010 1551   RBC 4.77 01/05/2012 0946   RBC 3.81* 02/26/2010 1551   HGB 14.5 01/05/2012 0946   HGB 12.3* 02/26/2010 1551   HCT 43.2 01/05/2012 0946   HCT 35.9* 02/26/2010 1551   PLT 185 01/05/2012 0946   PLT 154 02/26/2010 1551   MCV 90.6 01/05/2012 0946   MCV 94.3 02/26/2010 1551   MCH 30.4 01/05/2012 0946   MCHC 33.6 01/05/2012 0946   MCHC 34.3 02/26/2010 1551   RDW 13.6 01/05/2012 0946   RDW 15.0 02/26/2010 1551   LYMPHSABS 1.4 01/05/2012 0946   LYMPHSABS 1.4 02/26/2010 1551   MONOABS 0.7 01/05/2012 0946   MONOABS 0.6 02/26/2010 1551   EOSABS 0.2 01/05/2012 0946   EOSABS 0.4 02/26/2010 1551   BASOSABS 0.0 01/05/2012 0946   BASOSABS 0.0 02/26/2010 1551

## 2012-03-09 ENCOUNTER — Encounter: Payer: Self-pay | Admitting: Cardiology

## 2012-03-26 ENCOUNTER — Encounter: Payer: Self-pay | Admitting: Internal Medicine

## 2012-03-26 ENCOUNTER — Ambulatory Visit (INDEPENDENT_AMBULATORY_CARE_PROVIDER_SITE_OTHER): Payer: Medicare Other | Admitting: Internal Medicine

## 2012-03-26 VITALS — BP 142/68 | HR 65 | Ht 74.0 in | Wt 208.0 lb

## 2012-03-26 DIAGNOSIS — I509 Heart failure, unspecified: Secondary | ICD-10-CM

## 2012-03-26 DIAGNOSIS — I2589 Other forms of chronic ischemic heart disease: Secondary | ICD-10-CM

## 2012-03-26 DIAGNOSIS — Z95810 Presence of automatic (implantable) cardiac defibrillator: Secondary | ICD-10-CM

## 2012-03-26 LAB — ICD DEVICE OBSERVATION
AL AMPLITUDE: 2.5 mv
AL THRESHOLD: 0.5 V
BATTERY VOLTAGE: 2.65 V
DEVICE MODEL ICD: 541189
HV IMPEDENCE: 70 Ohm
RV LEAD AMPLITUDE: 11.7 mv

## 2012-03-26 NOTE — Assessment & Plan Note (Signed)
His device is working normally. We'll recheck in several months. 

## 2012-03-26 NOTE — Patient Instructions (Addendum)
Your physician wants you to follow-up in: 12 months Dr Court Joy will receive a reminder letter in the mail two months in advance. If you don't receive a letter, please call our office to schedule the follow-up appointment.   Remote monitoring is used to monitor your Pacemaker of ICD from home. This monitoring reduces the number of office visits required to check your device to one time per year. It allows [place] to keep an eye on the functioning of your device to ensure it is working properly. You are scheduled for a device check from home on 06/28/12. You may send your transmission at any time that day. If you have a wireless device, the transmission will be sent automatically. After your physician reviews your transmission, you will receive a postcard with your next transmission date.

## 2012-03-26 NOTE — Progress Notes (Signed)
HPI [name] returns today for followup. He is a 66 year old man with a history of an ischemic cardiomyopathy, status post ICD implantation, chronic class II congestive heart failure, with severe arthritis in his knees. He has a remote history of knee replacement surgery on the left. He would like to undergo knee replacement surgery on the right. There is been a concern about surgical risk. The patient denies anginal symptoms. He cuts grass several times a week without difficulty. Allergies  Allergen Reactions  . Codeine     REACTION: G I  distress     Current Outpatient Prescriptions  Medication Sig Dispense Refill  . aspirin 325 MG tablet Take 325 mg by mouth daily.        [name] atorvastatin (LIPITOR) 80 MG tablet Take 80 mg by mouth daily.       . carvedilol (COREG) 12.5 MG tablet Take 1 tablet (12.5 mg total) by mouth 2 (two) times daily with a meal.  60 tablet  6  . clopidogrel (PLAVIX) 75 MG tablet Take 1 tablet (75 mg total) by mouth daily.  30 tablet  3  . furosemide (LASIX) 40 MG tablet TAKE ONE TABLET BY MOUTH AS DIRECTED  30 tablet  6  . glimepiride (AMARYL) 4 MG tablet Take 4 mg by mouth daily before breakfast.        . lisinopril (PRINIVIL,ZESTRIL) 10 MG tablet TAKE ONE TABLET BY MOUTH TWICE DAILY  180 tablet  3  . meclizine (ANTIVERT) 25 MG tablet Take 25 mg by mouth 4 (four) times daily.       . metFORMIN (GLUCOPHAGE-XR) 500 MG 24 hr tablet Take 500 mg by mouth 2 (two) times daily.        . nitroGLYCERIN (NITROSTAT) 0.4 MG SL tablet Place 0.4 mg under the tongue as directed.        [name] spironolactone (ALDACTONE) 25 MG tablet TAKE ONE TABLET BY MOUTH EVERY DAY IN THE MORNING  30 tablet  12  . traMADol (ULTRAM) 50 MG tablet Take 50 mg by mouth every 8 (eight) hours as needed.       [name] DISCONTD: atorvastatin (LIPITOR) 80 MG tablet TAKE ONE TABLET BY MOUTH EVERY DAY  30 tablet  6     Past Medical History  Diagnosis Date  . Cerebrovascular disease   . Anemia, iron deficiency   .  Sinus bradycardia   . Tobacco abuse   . Arm pain   . Chronic renal insufficiency   . Hypertension   . Osteoarthritis   . Diabetes mellitus   . Anemia   . Mitral regurgitation   . Systolic CHF, chronic   . PVD (peripheral vascular disease)   . Cardiomyopathy, ischemic   . Hx of CABG   . Heart attack   . CAD (coronary artery disease)   . Unstable angina   . Stroke     ROS:   All systems reviewed and negative except as noted in the HPI.   Past Surgical History  Procedure Date  . Coronary artery bypass graft   . Percutaneous coronary intervention 01/10/2005    [name] Constable, MD  . Coronary artery bypass graft 08/29/2007    x3; [name] Decent. Cornelius Moras MD  . Cardiac defibrillator placement 04/05/2008    Implantation of a [employer] Jude dual-chamber defibrillator, [name]. [name] , MD     Family History  Problem Relation Age of Onset  . Heart attack Mother     CVA, MI  . Diabetes Mother   .  Heart attack Father     MI  . Diabetes Sister   . Coronary artery disease Brother     CABG     History   Social History  . Marital Status: Married    Spouse Name: N/A    Number of Children: N/A  . Years of Education: N/A   Occupational History  . Retired    Social History Main Topics  . Smoking status: Former Smoker -- 1.0 packs/day for 15 years    Quit date: 09/29/1990  . Smokeless tobacco: Never Used  . Alcohol Use: No  . Drug Use: No  . Sexually Active: Not on file   Other Topics Concern  . Not on file   Social History Narrative   [place] in [place] with wifeBeen on disability since his stroke in the 1990sNot routinely exercising     BP 142/68  Pulse 65  Ht 6\' 2"  (1.88 m)  Wt 208 lb (94.348 kg)  BMI 26.71 kg/m2  Physical Exam:  Well appearing NAD HEENT: Unremarkable Neck:  No JVD, no thyromegally Lungs:  Clear with no wheezes, rales, or rhonchi. HEART:  Regular rate rhythm, no murmurs, no rubs, no clicks Abd:  soft, positive bowel sounds, no organomegally, no  rebound, no guarding Ext:  2 plus pulses, no edema, no cyanosis, no clubbing Skin:  No rashes no nodules Neuro:  CN II through XII intact, motor grossly intact  DEVICE  Normal device function.  See PaceArt for details.   Assess/Plan:

## 2012-03-26 NOTE — Assessment & Plan Note (Signed)
He denies anginal symptoms. No change in medical therapy. 

## 2012-03-26 NOTE — Assessment & Plan Note (Signed)
He appears to have stable class II symptoms. I've encouraged him to maintain a low-sodium diet. I will discuss surgical candidacy for possible knee replacement surgery with his primary cardiologist.

## 2012-04-05 ENCOUNTER — Other Ambulatory Visit (HOSPITAL_BASED_OUTPATIENT_CLINIC_OR_DEPARTMENT_OTHER): Payer: Medicare Other | Admitting: Lab

## 2012-04-05 DIAGNOSIS — D649 Anemia, unspecified: Secondary | ICD-10-CM

## 2012-04-05 DIAGNOSIS — D509 Iron deficiency anemia, unspecified: Secondary | ICD-10-CM

## 2012-04-05 DIAGNOSIS — E538 Deficiency of other specified B group vitamins: Secondary | ICD-10-CM

## 2012-04-05 LAB — CBC WITH DIFFERENTIAL/PLATELET
BASO%: 0.5 % (ref 0.0–2.0)
Basophils Absolute: 0 10*3/uL (ref 0.0–0.1)
EOS%: 4.2 % (ref 0.0–7.0)
HCT: 39.5 % (ref 38.4–49.9)
HGB: 13.2 g/dL (ref 13.0–17.1)
LYMPH%: 24 % (ref 14.0–49.0)
MCH: 31.1 pg (ref 27.2–33.4)
MCHC: 33.5 g/dL (ref 32.0–36.0)
MCV: 92.7 fL (ref 79.3–98.0)
MONO%: 10.9 % (ref 0.0–14.0)
NEUT%: 60.4 % (ref 39.0–75.0)
Platelets: 174 10*3/uL (ref 140–400)

## 2012-04-05 LAB — IRON AND TIBC
Iron: 47 ug/dL (ref 42–165)
TIBC: 310 ug/dL (ref 215–435)
UIBC: 263 ug/dL (ref 125–400)

## 2012-04-06 ENCOUNTER — Telehealth: Payer: Self-pay | Admitting: Nurse Practitioner

## 2012-04-06 NOTE — Telephone Encounter (Signed)
Requested return call.  Need to ask if pt is taking VitB12 1000 mcg daily and FeSO4 325mg  bid as instructed.

## 2012-04-12 ENCOUNTER — Telehealth: Payer: Self-pay | Admitting: Nurse Practitioner

## 2012-04-12 ENCOUNTER — Encounter: Payer: Self-pay | Admitting: Oncology

## 2012-04-12 ENCOUNTER — Telehealth: Payer: Self-pay | Admitting: Internal Medicine

## 2012-04-12 NOTE — Telephone Encounter (Signed)
Spoke with patient. Inquired if he had been taking oral iron and Vitamin B12 as instructed.  Pt stated he was not.  RN reviewed instructions per Felicita Gage last note.  Pt verbalized understanding and stated he would begin taking medications.

## 2012-04-12 NOTE — Telephone Encounter (Signed)
Spoke with patient.  Dr Ladona Ridgel is suppose to be discussing with Dr Daleen Squibb about patient and his knee surgery  I let him know I will ask Dr Ladona Ridgel tomorrow and call him back

## 2012-04-12 NOTE — Progress Notes (Signed)
On 04/05/2012, ferritin was 45 and the vitamin B12 level was 264.  On 01/09/2012, the patient was called by Zella Ball and instructed to take ferrous sulfate 325 mg twice a day and vitamin B12 1000 mcg daily. Apparently he has been noncompliant. He was called recently by Lora Havens and he said that he would start taking these medicines as directed.

## 2012-04-12 NOTE — Telephone Encounter (Signed)
New msg Pt wants to talk to you about getting knee replacement. He hasnt heard anything about it being ok. Please call

## 2012-04-13 NOTE — Telephone Encounter (Signed)
Dr Ladona Ridgel and Dr  Daleen Squibb both agree not a good idea to do surgery at this point.  He is aware.  I let him know that it is a blood flow issue with healing and risk of infection.

## 2012-05-25 ENCOUNTER — Other Ambulatory Visit (HOSPITAL_COMMUNITY): Payer: Self-pay | Admitting: *Deleted

## 2012-05-25 ENCOUNTER — Ambulatory Visit (HOSPITAL_COMMUNITY)
Admission: RE | Admit: 2012-05-25 | Discharge: 2012-05-25 | Disposition: A | Discharge status: Home / Self Care | Payer: Medicare Other | Source: Ambulatory Visit | Attending: *Deleted | Admitting: *Deleted

## 2012-05-25 DIAGNOSIS — M79609 Pain in unspecified limb: Secondary | ICD-10-CM | POA: Insufficient documentation

## 2012-05-25 DIAGNOSIS — E119 Type 2 diabetes mellitus without complications: Secondary | ICD-10-CM

## 2012-05-25 DIAGNOSIS — M2010 Hallux valgus (acquired), unspecified foot: Secondary | ICD-10-CM | POA: Insufficient documentation

## 2012-06-28 ENCOUNTER — Encounter: Payer: Medicare Other | Admitting: *Deleted

## 2012-06-28 ENCOUNTER — Encounter: Payer: Self-pay | Admitting: *Deleted

## 2012-07-06 ENCOUNTER — Ambulatory Visit: Payer: Medicare Other | Admitting: Oncology

## 2012-07-06 ENCOUNTER — Telehealth: Payer: Self-pay | Admitting: Oncology

## 2012-07-06 ENCOUNTER — Other Ambulatory Visit: Payer: Medicare Other

## 2012-07-06 ENCOUNTER — Ambulatory Visit: Payer: Medicare Other | Admitting: Family

## 2012-07-06 NOTE — Telephone Encounter (Signed)
pt called to r/s missed 10/8 appt,r/s to 10/9,pt aware    aom

## 2012-07-07 ENCOUNTER — Encounter: Payer: Self-pay | Admitting: Family

## 2012-07-07 ENCOUNTER — Other Ambulatory Visit (HOSPITAL_BASED_OUTPATIENT_CLINIC_OR_DEPARTMENT_OTHER): Payer: Medicare Other | Admitting: Lab

## 2012-07-07 ENCOUNTER — Telehealth: Payer: Self-pay | Admitting: Oncology

## 2012-07-07 ENCOUNTER — Ambulatory Visit (HOSPITAL_BASED_OUTPATIENT_CLINIC_OR_DEPARTMENT_OTHER): Payer: Medicare Other | Admitting: Family

## 2012-07-07 VITALS — BP 139/80 | HR 97 | Temp 97.0°F | Resp 20 | Ht 74.0 in | Wt 209.2 lb

## 2012-07-07 DIAGNOSIS — E538 Deficiency of other specified B group vitamins: Secondary | ICD-10-CM

## 2012-07-07 DIAGNOSIS — D509 Iron deficiency anemia, unspecified: Secondary | ICD-10-CM

## 2012-07-07 DIAGNOSIS — F17200 Nicotine dependence, unspecified, uncomplicated: Secondary | ICD-10-CM

## 2012-07-07 DIAGNOSIS — D649 Anemia, unspecified: Secondary | ICD-10-CM

## 2012-07-07 DIAGNOSIS — N289 Disorder of kidney and ureter, unspecified: Secondary | ICD-10-CM

## 2012-07-07 LAB — CBC WITH DIFFERENTIAL/PLATELET
BASO%: 0.1 % (ref 0.0–2.0)
EOS%: 1 % (ref 0.0–7.0)
HCT: 44.7 % (ref 38.4–49.9)
LYMPH%: 27.8 % (ref 14.0–49.0)
MCH: 29.4 pg (ref 27.2–33.4)
MCHC: 33.3 g/dL (ref 32.0–36.0)
MCV: 88.3 fL (ref 79.3–98.0)
MONO#: 1.1 10*3/uL — ABNORMAL HIGH (ref 0.1–0.9)
MONO%: 12.7 % (ref 0.0–14.0)
NEUT%: 58.4 % (ref 39.0–75.0)
Platelets: 203 10*3/uL (ref 140–400)
RBC: 5.06 10*6/uL (ref 4.20–5.82)

## 2012-07-07 LAB — COMPREHENSIVE METABOLIC PANEL (CC13)
ALT: 47 U/L (ref 0–55)
Alkaline Phosphatase: 149 U/L (ref 40–150)
CO2: 25 mEq/L (ref 22–29)
Creatinine: 1.1 mg/dL (ref 0.7–1.3)
Total Bilirubin: 0.5 mg/dL (ref 0.20–1.20)

## 2012-07-07 LAB — IRON AND TIBC
%SAT: 12 % — ABNORMAL LOW (ref 20–55)
TIBC: 331 ug/dL (ref 215–435)
UIBC: 292 ug/dL (ref 125–400)

## 2012-07-07 LAB — FERRITIN: Ferritin: 33 ng/mL (ref 22–322)

## 2012-07-07 LAB — LACTATE DEHYDROGENASE (CC13): LDH: 209 U/L (ref 125–220)

## 2012-07-07 NOTE — Patient Instructions (Addendum)
We'll call you with the results of your iron laboratories and B12 level.

## 2012-07-07 NOTE — Progress Notes (Signed)
Patient ID: [name], male   DOB: 1946/04/16, 66 y.o.   MRN: [id number] CSN: [id number]  Cc:  [name] Jester, DO Jesse Sans. Wall, MD,  FACC R. Roetta Sessions, MD, FACP, [facility] Salvatore Decent. Cornelius Moras, MD   Problem List: [name] is a 66 y.o. Caucasian male with a problem list consisting of:  1.  Iron deficiency anemia 2.  Vitamin B12 deficiency 3.  Chronic renal insufficiency 4.  Tobacco Abuse 5.  Diabetes mellitus 6.  Osteoarthritis 7.  Systolic CHF 8.  Unstable angina 9.  Left hemispheric CVA 10. S/p  MI 4. Right dual chamber implantable ICD 12. Splenomegaly 13. S/p CABG 14. Ischemic cardiomyopathy 15.  Dyslipidemia 16.  HTN  Dr. Arline Asp and I saw [name] today for clinic follow up of his anemia, felt to be due to iron deficiency and vitamin B12 deficiency, as well as some degree of renal insufficiency.  We last saw [name] on 01/05/2012.  Since his last clinic visit, he denies any symptomatology including fever, chills,  SOB, PICA, fatigue, unusual bleeding, N/V/D or constipation. The patient has chronic bilateral knee pain with the greatest discomfort being in his right knee.  His pain is controlled with PRN Tramadol. He states that he really needs knee replacement, but no clearance is available per cardiologist due to the possibility of complications during surgery.   He has not been taking oral  B12 or iron because he states these medications make him nauseated (without emesis) when he takes them.  He states he stopped taking the medications about a month ago.  Dr. Arline Asp explained to him that his iron studies and B12 level are pending, but should he be deficient, he might consider taking the medications before bed.  The patient agreed to do so.  Past Medical History: Past Medical History  Diagnosis Date  . Cerebrovascular disease   . Anemia, iron deficiency   . Sinus bradycardia   . Tobacco abuse   . Arm pain   . Chronic renal insufficiency   . Hypertension     . Osteoarthritis   . Diabetes mellitus   . Anemia   . Mitral regurgitation   . Systolic CHF, chronic   . PVD (peripheral vascular disease)   . Cardiomyopathy, ischemic   . Hx of CABG   . Heart attack   . CAD (coronary artery disease)   . Unstable angina   . Stroke     Surgical History: Past Surgical History  Procedure Date  . Coronary artery bypass graft   . Percutaneous coronary intervention 01/10/2005    Charlies Constable, MD  . Coronary artery bypass graft 08/29/2007    x3; Salvatore Decent. Cornelius Moras MD  . Cardiac defibrillator placement 04/05/2008    Implantation of a [employer] dual-chamber defibrillator, Doylene Canning. Ladona Ridgel , MD    Current Medications: Current Outpatient Prescriptions  Medication Sig Dispense Refill  . aspirin 325 MG tablet Take 325 mg by mouth daily.        [name] atorvastatin (LIPITOR) 80 MG tablet Take 80 mg by mouth daily.       . carvedilol (COREG) 12.5 MG tablet Take 1 tablet (12.5 mg total) by mouth 2 (two) times daily with a meal.  60 tablet  6  . clopidogrel (PLAVIX) 75 MG tablet Take 1 tablet (75 mg total) by mouth daily.  30 tablet  3  . furosemide (LASIX) 40 MG tablet TAKE ONE TABLET BY MOUTH AS DIRECTED  30 tablet  6  . glimepiride (AMARYL) 4 MG tablet Take 4 mg by mouth daily before breakfast.        . lisinopril (PRINIVIL,ZESTRIL) 10 MG tablet TAKE ONE TABLET BY MOUTH TWICE DAILY  180 tablet  3  . meclizine (ANTIVERT) 25 MG tablet Take 25 mg by mouth 4 (four) times daily.       . metFORMIN (GLUCOPHAGE-XR) 500 MG 24 hr tablet Take 500 mg by mouth 2 (two) times daily.        . nitroGLYCERIN (NITROSTAT) 0.4 MG SL tablet Place 0.4 mg under the tongue as directed.        [name] spironolactone (ALDACTONE) 25 MG tablet TAKE ONE TABLET BY MOUTH EVERY DAY IN THE MORNING  30 tablet  12  . traMADol (ULTRAM) 50 MG tablet Take 50 mg by mouth every 8 (eight) hours as needed.         Allergies: Allergies  Allergen Reactions  . Codeine     REACTION: G I  distress    Family  History: Family History  Problem Relation Age of Onset  . Heart attack Mother     CVA, MI  . Diabetes Mother   . Heart attack Father     MI  . Diabetes Sister   . Coronary artery disease Brother     CABG    Social History: History  Substance Use Topics  . Smoking status: Former Smoker -- 1.0 packs/day for 15 years    Quit date: 09/29/1990  . Smokeless tobacco: Never Used  . Alcohol Use: No    Review of Systems: 10 Point review of systems was completed and is negative except as noted above.   Physical Exam:   Blood pressure 139/80, pulse 97, temperature 97 F (36.1 C), temperature source Oral, resp. rate 20, height 6\' 2"  (1.88 m), weight 209 lb 3.2 oz (94.892 kg).  General appearance: Alert, cooperative, well nourished, no apparent distress Head: Normocephalic, without obvious abnormality, atraumatic Eyes: Conjunctivae/corneas clear, PERRLA, EOMI Nose: Nares, septum and mucosa are normal, no drainage or sinus tenderness Neck: No adenopathy, supple, symmetrical, trachea midline, thyroid not enlarged, no tenderness Resp: Clear to auscultation bilaterally Cardio: Regular rate and rhythm, S1, S2 normal,  1/6 systolic murmur, no click, rub or gallop, right chest implanted ICD w/o signs of infection GI: Soft, distended, non-tender, normoactive bowel sounds,  no organomegaly Extremities: Extremities normal, atraumatic, no cyanosis, +1 bilateral LE edema Lymph nodes: Cervical, supraclavicular, and axillary nodes normal Neurologic: Grossly normal, right sided hemiparesis   Laboratory Data: Results for orders placed in visit on 07/07/12 (from the past 48 hour(s))  CBC WITH DIFFERENTIAL     Status: Abnormal   Collection Time   07/07/12  8:40 AM      Component Value Range Comment   WBC 8.6  4.0 - 10.3 10e3/uL    NEUT# 5.0  1.5 - 6.5 10e3/uL    HGB 14.9  13.0 - 17.1 g/dL    HCT 16.1  09.6 - 04.5 %    Platelets 203  140 - 400 10e3/uL    MCV 88.3  79.3 - 98.0 fL    MCH 29.4   27.2 - 33.4 pg    MCHC 33.3  32.0 - 36.0 g/dL    RBC 4.09  8.11 - 9.14 10e6/uL    RDW 14.1  11.0 - 14.6 %    lymph# 2.4  0.9 - 3.3 10e3/uL    MONO# 1.1 (*) 0.1 - 0.9 10e3/uL  Eosinophils Absolute 0.1  0.0 - 0.5 10e3/uL    Basophils Absolute 0.0  0.0 - 0.1 10e3/uL    NEUT% 58.4  39.0 - 75.0 %    LYMPH% 27.8  14.0 - 49.0 %    MONO% 12.7  0.0 - 14.0 %    EOS% 1.0  0.0 - 7.0 %    BASO% 0.1  0.0 - 2.0 %   COMPREHENSIVE METABOLIC PANEL (CC13)     Status: Abnormal   Collection Time   07/07/12  8:40 AM      Component Value Range Comment   Sodium 141  136 - 145 mEq/L    Potassium 3.9  3.5 - 5.1 mEq/L    Chloride 104  98 - 107 mEq/L    CO2 25  22 - 29 mEq/L    Glucose 117 (*) 70 - 99 mg/dl    BUN 40.9  7.0 - 81.1 mg/dL    Creatinine 1.1  0.7 - 1.3 mg/dL    Total Bilirubin 9.14  0.20 - 1.20 mg/dL    Alkaline Phosphatase 149  40 - 150 U/L    AST 12  5 - 34 U/L    ALT 47  0 - 55 U/L    Total Protein 6.3 (*) 6.4 - 8.3 g/dL    Albumin 3.8  3.5 - 5.0 g/dL    Calcium 9.1  8.4 - 78.2 mg/dL   LACTATE DEHYDROGENASE (CC13)     Status: Normal   Collection Time   07/07/12  8:40 AM      Component Value Range Comment   LDH 209  125 - 220 U/L      Imaging Studies: 1.  02/26/2010  Chest xray 2 views showed no new acute abnormality, mild cardiomegaly, no edema. 2. 02/27/2010  Nuclear cardiac stress test study reveals a large anteroapical scar. There is also a smaller inferoseptal scar. There is decreased motion of the septum which may be related in part to the patient's history of CABG.  There is aneurysmal motion of the inferior septum and the apex. The other walls move adequately. There is a small amount of peri-infarct ischemia. Overall the study shows mostly scar with a small amount of peri-infarct ischemia.   Impression/Plan: [name] is a 66 year old Caucasian male with multifactorial anemia, felt to be due to deficiency, vitamin B12 deficiency, as well as renal insufficiency.  His  renal insufficiency has improved.   [name] B12 level and iron studies are pending and we will contact him with the results.  If his levels are adequate, we will see the patient again in 6 months' time with laboratories to be drawn at that time (CMP, CBC, LDH, Ferritin, Iron/TIBC and Vitamin B12).  If his studies indicated deficiency, the patient will be instructed to re-start oral B12 and/or oral ferrous sulfate and an interim laboratory check in 3 months time may be warranted. The patient was asked to contact us in the interim if any questions or concerns.    Larina Bras, NP-C 07/07/2012, 8:49 AM

## 2012-07-07 NOTE — Telephone Encounter (Signed)
Gave pt appt for April 2014 lab and MD °

## 2012-07-08 ENCOUNTER — Other Ambulatory Visit: Payer: Self-pay | Admitting: Family

## 2012-07-08 ENCOUNTER — Telehealth: Payer: Self-pay | Admitting: Family

## 2012-07-08 DIAGNOSIS — D649 Anemia, unspecified: Secondary | ICD-10-CM

## 2012-07-08 NOTE — Telephone Encounter (Signed)
Spoke to [name] about low Ferritin and B12 levels.   Asked him to re-start taking oral ferrous sulfate 325 mg PO daily and oral vitamin B12 1000 mcg PO daily.  Patient agreed to do so and verbalized understanding.  Let the patient know that we would like to check laboratories in 3 months' time (October 06, 2012).  The patient agreed to do so.  The patient expressed that he would rather have vitamin B12 injections.  Will discuss with Dr. Arline Asp.

## 2012-07-09 ENCOUNTER — Telehealth: Payer: Self-pay | Admitting: Oncology

## 2012-07-09 ENCOUNTER — Ambulatory Visit (HOSPITAL_BASED_OUTPATIENT_CLINIC_OR_DEPARTMENT_OTHER): Payer: Medicare Other

## 2012-07-09 ENCOUNTER — Other Ambulatory Visit: Payer: Self-pay | Admitting: Family

## 2012-07-09 DIAGNOSIS — E538 Deficiency of other specified B group vitamins: Secondary | ICD-10-CM

## 2012-07-09 DIAGNOSIS — D649 Anemia, unspecified: Secondary | ICD-10-CM

## 2012-07-09 MED ORDER — CYANOCOBALAMIN 1000 MCG/ML IJ SOLN
1000.0000 ug | INTRAMUSCULAR | Status: DC
  Administered 2012-07-09: 1000 ug via INTRAMUSCULAR

## 2012-07-09 NOTE — Telephone Encounter (Signed)
s.w. pt and gv appt for [facility]

## 2012-07-09 NOTE — Telephone Encounter (Signed)
S/w pt confirming next inj for 11/7 and pt will get schedule when he comes in,

## 2012-07-20 ENCOUNTER — Telehealth: Payer: Self-pay | Admitting: Cardiology

## 2012-07-20 NOTE — Telephone Encounter (Signed)
Left a message to call back.

## 2012-07-20 NOTE — Telephone Encounter (Signed)
New problem:   Patient was taken lisinopril - enrolled in heart failure - ejection fraction 35 %. Please advise why patient is not  ACE

## 2012-07-20 NOTE — Telephone Encounter (Signed)
Shanda Bumps with [facility] phoned questioning why patient was not on ACE/ARB.  After reviewing chart stated according to our records patient should be taking Lisinopril.  Shanda Bumps spoke further with patient and he advised her that Dr Charm Barges stopped Lisinopril about 6 months secondary to labs.  Will forward to Dr Daleen Squibb for review

## 2012-07-25 ENCOUNTER — Other Ambulatory Visit: Payer: Self-pay | Admitting: Cardiology

## 2012-07-26 ENCOUNTER — Telehealth: Payer: Self-pay | Admitting: Cardiology

## 2012-07-26 NOTE — Telephone Encounter (Signed)
New Problem:    Patient's insurance agent was calling in because the patient was prescribed a Digital Scale attached to his phone from [employer].  The patient's insurance agent needs to know if the patient's scale was prescribed for diabeties (which there would be no copay) or if it was prescribed for a heart related issue (which there would be a copay) needs.  Please call back.

## 2012-07-26 NOTE — Telephone Encounter (Signed)
I see the coreg was ordered, I will forward to Dr Vern Claude nurse to further answer the questions regarding the digital scale.

## 2012-07-26 NOTE — Telephone Encounter (Signed)
Pt requesting refill of coreg at [facility]

## 2012-07-28 ENCOUNTER — Emergency Department (HOSPITAL_COMMUNITY): Payer: Medicare Other

## 2012-07-28 ENCOUNTER — Observation Stay (HOSPITAL_COMMUNITY)
Admission: EM | Admit: 2012-07-28 | Discharge: 2012-07-29 | Disposition: A | Discharge status: Home / Self Care | Payer: Medicare Other | Attending: Internal Medicine | Admitting: Internal Medicine

## 2012-07-28 ENCOUNTER — Encounter (HOSPITAL_COMMUNITY): Payer: Self-pay | Admitting: *Deleted

## 2012-07-28 DIAGNOSIS — E876 Hypokalemia: Secondary | ICD-10-CM | POA: Diagnosis not present

## 2012-07-28 DIAGNOSIS — I10 Essential (primary) hypertension: Secondary | ICD-10-CM | POA: Diagnosis present

## 2012-07-28 DIAGNOSIS — I509 Heart failure, unspecified: Secondary | ICD-10-CM | POA: Insufficient documentation

## 2012-07-28 DIAGNOSIS — R079 Chest pain, unspecified: Secondary | ICD-10-CM | POA: Insufficient documentation

## 2012-07-28 DIAGNOSIS — I5023 Acute on chronic systolic (congestive) heart failure: Secondary | ICD-10-CM | POA: Insufficient documentation

## 2012-07-28 DIAGNOSIS — I2589 Other forms of chronic ischemic heart disease: Secondary | ICD-10-CM | POA: Insufficient documentation

## 2012-07-28 DIAGNOSIS — F17200 Nicotine dependence, unspecified, uncomplicated: Secondary | ICD-10-CM | POA: Insufficient documentation

## 2012-07-28 DIAGNOSIS — I129 Hypertensive chronic kidney disease with stage 1 through stage 4 chronic kidney disease, or unspecified chronic kidney disease: Secondary | ICD-10-CM | POA: Insufficient documentation

## 2012-07-28 DIAGNOSIS — I209 Angina pectoris, unspecified: Secondary | ICD-10-CM | POA: Insufficient documentation

## 2012-07-28 DIAGNOSIS — I739 Peripheral vascular disease, unspecified: Secondary | ICD-10-CM | POA: Insufficient documentation

## 2012-07-28 DIAGNOSIS — M1990 Unspecified osteoarthritis, unspecified site: Secondary | ICD-10-CM | POA: Insufficient documentation

## 2012-07-28 DIAGNOSIS — E119 Type 2 diabetes mellitus without complications: Secondary | ICD-10-CM | POA: Insufficient documentation

## 2012-07-28 DIAGNOSIS — Z951 Presence of aortocoronary bypass graft: Secondary | ICD-10-CM | POA: Insufficient documentation

## 2012-07-28 DIAGNOSIS — N189 Chronic kidney disease, unspecified: Secondary | ICD-10-CM | POA: Insufficient documentation

## 2012-07-28 DIAGNOSIS — I2510 Atherosclerotic heart disease of native coronary artery without angina pectoris: Principal | ICD-10-CM | POA: Insufficient documentation

## 2012-07-28 DIAGNOSIS — E1351 Other specified diabetes mellitus with diabetic peripheral angiopathy without gangrene: Secondary | ICD-10-CM | POA: Diagnosis present

## 2012-07-28 DIAGNOSIS — R072 Precordial pain: Secondary | ICD-10-CM

## 2012-07-28 DIAGNOSIS — I059 Rheumatic mitral valve disease, unspecified: Secondary | ICD-10-CM | POA: Insufficient documentation

## 2012-07-28 DIAGNOSIS — Z8673 Personal history of transient ischemic attack (TIA), and cerebral infarction without residual deficits: Secondary | ICD-10-CM | POA: Insufficient documentation

## 2012-07-28 HISTORY — DX: Gastrointestinal hemorrhage, unspecified: K92.2

## 2012-07-28 HISTORY — DX: Paroxysmal atrial fibrillation: I48.0

## 2012-07-28 HISTORY — DX: Splenomegaly, not elsewhere classified: R16.1

## 2012-07-28 LAB — CBC WITH DIFFERENTIAL/PLATELET
Basophils Absolute: 0 10*3/uL (ref 0.0–0.1)
Basophils Relative: 0 % (ref 0–1)
Eosinophils Relative: 2 % (ref 0–5)
HCT: 42.1 % (ref 39.0–52.0)
Lymphocytes Relative: 18 % (ref 12–46)
MCHC: 34 g/dL (ref 30.0–36.0)
MCV: 86.4 fL (ref 78.0–100.0)
Monocytes Absolute: 0.8 10*3/uL (ref 0.1–1.0)
Monocytes Relative: 11 % (ref 3–12)
RDW: 13.7 % (ref 11.5–15.5)

## 2012-07-28 LAB — TROPONIN I: Troponin I: <0.3 ng/mL (ref <0.30)

## 2012-07-28 LAB — BASIC METABOLIC PANEL
BUN: 20 mg/dL (ref 6–23)
CO2: 25 mEq/L (ref 19–32)
Chloride: 107 mEq/L (ref 96–112)
Creatinine, Ser: 1.1 mg/dL (ref 0.50–1.35)

## 2012-07-28 LAB — CBC
HCT: 41.4 % (ref 39.0–52.0)
RBC: 4.8 MIL/uL (ref 4.22–5.81)
RDW: 13.7 % (ref 11.5–15.5)
WBC: 6.2 10*3/uL (ref 4.0–10.5)

## 2012-07-28 LAB — URINALYSIS, ROUTINE W REFLEX MICROSCOPIC
Bilirubin Urine: NEGATIVE
Glucose, UA: NEGATIVE mg/dL
Hgb urine dipstick: NEGATIVE
Specific Gravity, Urine: 1.024 (ref 1.005–1.030)
Urobilinogen, UA: 1 mg/dL (ref 0.0–1.0)

## 2012-07-28 LAB — CREATININE, SERUM: Creatinine, Ser: 1.05 mg/dL (ref 0.50–1.35)

## 2012-07-28 MED ORDER — SODIUM CHLORIDE 0.9 % IV SOLN
INTRAVENOUS | Status: DC
  Administered 2012-07-28: 75 mL/h via INTRAVENOUS

## 2012-07-28 MED ORDER — ATORVASTATIN CALCIUM 80 MG PO TABS
80.0000 mg | ORAL_TABLET | Freq: Every day | ORAL | Status: DC
  Filled 2012-07-28 (×2): qty 1

## 2012-07-28 MED ORDER — ACETAMINOPHEN 325 MG PO TABS: 650.0000 mg | ORAL_TABLET | ORAL | Status: DC | PRN

## 2012-07-28 MED ORDER — ASPIRIN EC 81 MG PO TBEC
81.0000 mg | DELAYED_RELEASE_TABLET | Freq: Every day | ORAL | Status: DC
  Administered 2012-07-29: 81 mg via ORAL
  Filled 2012-07-28 (×2): qty 1

## 2012-07-28 MED ORDER — HEPARIN SODIUM (PORCINE) 5000 UNIT/ML IJ SOLN
5000.0000 [IU] | Freq: Three times a day (TID) | INTRAMUSCULAR | Status: DC
  Administered 2012-07-28 – 2012-07-29 (×3): 5000 [IU] via SUBCUTANEOUS
  Filled 2012-07-28 (×5): qty 1

## 2012-07-28 MED ORDER — TRAMADOL HCL 50 MG PO TABS
50.0000 mg | ORAL_TABLET | Freq: Three times a day (TID) | ORAL | Status: DC | PRN
  Administered 2012-07-28 – 2012-07-29 (×3): 50 mg via ORAL
  Filled 2012-07-28 (×3): qty 1

## 2012-07-28 MED ORDER — ONDANSETRON HCL 4 MG/2ML IJ SOLN: 4.0000 mg | Freq: Four times a day (QID) | INTRAMUSCULAR | Status: DC | PRN

## 2012-07-28 MED ORDER — SPIRONOLACTONE 25 MG PO TABS
25.0000 mg | ORAL_TABLET | Freq: Every day | ORAL | Status: DC
  Filled 2012-07-28 (×2): qty 1

## 2012-07-28 MED ORDER — SODIUM CHLORIDE 0.9 % IJ SOLN
3.0000 mL | Freq: Two times a day (BID) | INTRAMUSCULAR | Status: DC
  Administered 2012-07-29: 3 mL via INTRAVENOUS

## 2012-07-28 MED ORDER — CLOPIDOGREL BISULFATE 75 MG PO TABS
75.0000 mg | ORAL_TABLET | Freq: Every day | ORAL | Status: DC
  Administered 2012-07-29: 75 mg via ORAL
  Filled 2012-07-28: qty 1

## 2012-07-28 MED ORDER — CARVEDILOL 12.5 MG PO TABS
12.5000 mg | ORAL_TABLET | Freq: Two times a day (BID) | ORAL | Status: DC
  Administered 2012-07-28 – 2012-07-29 (×3): 12.5 mg via ORAL
  Filled 2012-07-28 (×4): qty 1

## 2012-07-28 MED ORDER — SODIUM CHLORIDE 0.9 % IV SOLN: 250.0000 mL | INTRAVENOUS | Status: DC | PRN

## 2012-07-28 MED ORDER — REGADENOSON 0.4 MG/5ML IV SOLN
0.4000 mg | Freq: Once | INTRAVENOUS | Status: AC
  Administered 2012-07-29: 0.4 mg via INTRAVENOUS
  Filled 2012-07-28: qty 5

## 2012-07-28 MED ORDER — FUROSEMIDE 40 MG PO TABS
40.0000 mg | ORAL_TABLET | Freq: Every day | ORAL | Status: DC
  Administered 2012-07-28: 40 mg via ORAL
  Filled 2012-07-28 (×2): qty 1

## 2012-07-28 MED ORDER — SODIUM CHLORIDE 0.9 % IJ SOLN: 3.0000 mL | INTRAMUSCULAR | Status: DC | PRN

## 2012-07-28 MED ORDER — GLIMEPIRIDE 4 MG PO TABS
4.0000 mg | ORAL_TABLET | Freq: Two times a day (BID) | ORAL | Status: DC
  Administered 2012-07-29 (×2): 4 mg via ORAL
  Filled 2012-07-28 (×3): qty 1

## 2012-07-28 MED ORDER — BACLOFEN 20 MG PO TABS
20.0000 mg | ORAL_TABLET | Freq: Two times a day (BID) | ORAL | Status: DC
  Administered 2012-07-28: 20 mg via ORAL
  Filled 2012-07-28 (×3): qty 1

## 2012-07-28 MED ORDER — FUROSEMIDE 10 MG/ML IJ SOLN: 40.0000 mg | Freq: Once | INTRAMUSCULAR | Status: DC

## 2012-07-28 MED ORDER — NITROGLYCERIN 0.4 MG SL SUBL: 0.4000 mg | SUBLINGUAL_TABLET | SUBLINGUAL | Status: DC | PRN

## 2012-07-28 MED ORDER — MECLIZINE HCL 25 MG PO TABS
25.0000 mg | ORAL_TABLET | Freq: Four times a day (QID) | ORAL | Status: DC | PRN
  Administered 2012-07-29 (×2): 25 mg via ORAL
  Filled 2012-07-28 (×2): qty 1

## 2012-07-28 MED ORDER — INSULIN ASPART 100 UNIT/ML ~~LOC~~ SOLN
0.0000 [IU] | Freq: Three times a day (TID) | SUBCUTANEOUS | Status: DC
  Administered 2012-07-29: 1 [IU] via SUBCUTANEOUS
  Administered 2012-07-29: 2 [IU] via SUBCUTANEOUS

## 2012-07-28 NOTE — ED Notes (Signed)
Patient waiting for consulting MD.

## 2012-07-28 NOTE — H&P (Addendum)
History and Physical  Patient ID: [name] MRN: [id number], DOB: 1945-11-25 Date of Encounter: 07/28/2012, 6:46 PM Primary Physician: [name], DO Primary Cardiologist: Daleen Squibb (EP-[facility])  Chief Complaint: chest pain  HPI: [name] is a 66 y/o M with hx of CAD s/p CABG 2008, ICM (EF 35-40%) s/p ICD, CKD, DM, CVA who presented to [facility] with complaints of chest pain. He was in his usual state of health today watching TV around 8:30am when he developed gradually increasing L sided chest pain with some associated L arm pain and mild SOB that eventually stayed constant for 6 hours. He denies any nausea, vomiting, diaphoresis, palpitations, syncope, or ICD discharge. He did not try anything at home to make it better. He denies any increase in pain with palpation, moving around, or inspiration. The pain was worse in between taking breaths. He thinks pain was possibly similar to prior angina, but less severe. Due to increasing pain, he called 911. BP was elevated per his report at 180 systolic on their arrival, but he had checked it earlier at 124/72 during pain. Normotensive in ED. He received 4 baby ASA & 1 SL NTG by EMS with gradual improvement in pain. He is pain free now. EKG: atrial paced nonspecific ST-T changes. Trop neg x 1. CBC/BMET otherwise unremarkable.  He does note a 5lb weight gain from his baseline. He has intermittent LEE that resolves with elevation - he thinks it might be more pronounced today but has gone down while waiting in the ED. No orthopnea, PND, abdominal swelling, recent surgery, bedrest or prolonged travel. He is not tachycardic, tachypneic or hypoxic.  Past Medical History  Diagnosis Date  . Cerebrovascular disease   . Sinus bradycardia   . Tobacco abuse   . Chronic renal insufficiency   . Hypertension   . Osteoarthritis   . Diabetes mellitus   . Anemia     Multifactorial  - hx of B12 def and iron deficiency, followed by heme  . Mitral  regurgitation   . Systolic CHF, chronic   . PVD (peripheral vascular disease)     a. Prior carotid dz/ surgery. b. Carotid dopp 07/2011 - 0-39% bilaterally.  . Cardiomyopathy, ischemic     a. Chronic systolic CHF s/p St. Jude dual chamber ICD 03/2008.  [name] Hx of CABG   . CAD (coronary artery disease)     a. STEMI s/p CABG 07/2007 (had preop cardiogenic shock, IABP, VDRF before surgery)  . Stroke     a. CVA 1990s - chronic pain in RUE after stroke.  [name] PAF (paroxysmal atrial fibrillation)     a. Poor Coumadin candidate due to hx of GI bleed.  . GI bleed     Reported history  . Splenomegaly      Most Recent Cardiac Studies: Myoview 02/2010 IMPRESSION: The study reveals a large anteroapical scar. There is also a smaller inferoseptal scar. There is decreased motion of the septum which may be related in part to the patient's history of CABG. There is aneurysmal motion of the inferior septum and the apex. The other walls move adequately. There is a small amount of peri- infarct ischemia. Overall the study shows mostly scar with a small amount of peri-infarct ischemia.The ejection fraction is 40%.    Cath 03/2008 PROCEDURES:  1. Left heart catheterization.  2. Selective coronary arteriography.  3. Selective left ventriculography.  4. Saphenous vein graft angiography.  5. Selective left internal mammary angiography.  DESCRIPTION OF THE  PROCEDURE: The patient was brought to the cath lab,  prepped and draped in the usual fashion. We carefully prepped the right  groin. Through an anterior puncture, the femoral artery was entered and  a 6-French sheath was placed. Views of the left and right coronary  arteries were obtained in multiple angiographic projections. Central  aortic and left ventricular pressures were measured with Pigtail  catheter. Ventriculography was then performed in the RAO projection.  Overall, the patient tolerated the procedure well and there were no  complications. He  was taken to the holding area in satisfactory  clinical condition. An ACT was checked because the patient was on  fractionated heparin. It was satisfactory for sheath pull.  I then discussed the case with Dr. Ladona Ridgel, and based on the fact that  his EF appears to be somewhat better by ventriculography, we elected to  do a 2-D echo before consideration of placement of his AICD. This  decision will be left up to Dr. Daleen Squibb and Dr. Ladona Ridgel.  HEMODYNAMIC DATA:  1. Central aortic pressure was 129/60, mean 86.  2. Left ventricular pressure 126/15.  3. There was no gradient pullback across the aortic valve.  ANGIOGRAPHIC DATA:  1. Left ventriculography was done in the RAO projection. Ejection  fraction would be estimated in the 35-45% range. There is an  anteroapical wall motion abnormality compatible with what appears  to be near akinesis to severe hypokinesis. The anterobasal and  inferobasal segments appear to move well.  2. The left main has about 20-30% ostial narrowing, but does not  appear to be focally obstructive.  3. The LAD demonstrates about 80% in-stent restenosis. There is a  competitive flow distally. There is a ramus intermedius that comes  off or optional diagonal and this has about 70% narrowing. Of  note, this is a small-to-moderate-sized vessel, but relatively  small in caliber over.  4. The left internal mammary to the distal LAD appears to be widely  patent. There is minimal tapering, as it inserts into the LAD, but  it does not appear to be focally obstructed and runoff into the  distal LAD is excellent.  5. The circumflex is large-caliber vessel. There is a large first  marginal branch that is stented. There is about 50% proximal  narrowing and competitive flow distally. The AV circumflex has 60-  70% narrowing after the takeoff of the marginal and maybe 50-60%  narrowing going into a moderate-size posterolateral branch. These  do not appear to be critical.  6. The  saphenous vein graft to the obtuse marginal is widely patent  and does fill some of the vessel retrograde.  7. The right coronary artery is severely diseased. There is a stent  proximally that has about 80% narrowing. There is then a 70-80%  narrowing proximal to the mid stent and then diffuse 70% narrowing.  8. The saphenous vein graft into the distal right coronary artery is  very small in caliber and there is a small amount of damping due to  the caliber of the graft that inserts into a PDA then fills  retrograde into the distal right circulation.  9. Of note, the saphenous vein graft to the obtuse marginal has a  large valve, however, does not appear to be obstructive.  CONCLUSIONS:  1. Moderate reduction in left ventricular function, but improved from  the previous acute study prior to revascularization surgery.  2. Continued patency of the internal mammary to the left anterior  descending.  3. Continued patency of saphenous vein graft to the obtuse marginal.  4. Continued patency of saphenous vein graft to the posterior  descending.  5. Scattered disease involving the intermediate and the  atrioventricular circumflex distally with moderate stenosis, but  small-to-moderate-sized vessels.  2D Echo 03/2008 SUMMARY - Septal and apical akinesis, anterior hypokinesis The left ventricle was moderately dilated. Overall left ventricular systolic function was moderately to markedly decreased. Left ventricular ejection fraction was estimated to be 35 %. Left ventricular wall thickness was mildly increased. - The aortic valve was mildly calcified. - There was mild mitral valvular regurgitation. - The left atrium was mildly dilated.      Surgical History:  Past Surgical History  Procedure Date  . Coronary artery bypass graft   . Percutaneous coronary intervention 01/10/2005    Charlies Constable, MD  . Coronary artery bypass graft 08/29/2007    x3; Salvatore Decent. Cornelius Moras MD  . Cardiac  defibrillator placement 04/05/2008    Implantation of a St. Jude dual-chamber defibrillator, Doylene Canning. Ladona Ridgel , MD  . Cardiac defibrillator placement      Home Meds: Prior to Admission medications   Medication Sig Start Date End Date Taking? Authorizing Provider  aspirin 325 MG tablet Take 325 mg by mouth daily.     Yes Historical Provider, MD  atorvastatin (LIPITOR) 80 MG tablet Take 80 mg by mouth daily.  12/18/11  Yes Gaylord Shih, MD  baclofen (LIORESAL) 20 MG tablet Take 20 mg by mouth 2 (two) times daily.   Yes Historical Provider, MD  carvedilol (COREG) 12.5 MG tablet TAKE ONE TABLET BY MOUTH TWICE DAILY WITH MEALS 07/25/12  Yes Gaylord Shih, MD  clopidogrel (PLAVIX) 75 MG tablet Take 1 tablet (75 mg total) by mouth daily. 10/01/11  Yes Gaylord Shih, MD  furosemide (LASIX) 40 MG tablet Take 40 mg by mouth daily.   Yes Historical Provider, MD  glimepiride (AMARYL) 4 MG tablet Take 4 mg by mouth 2 (two) times daily.    Yes Historical Provider, MD  meclizine (ANTIVERT) 25 MG tablet Take 25 mg by mouth 4 (four) times daily as needed. For dizziness 02/26/12  Yes Historical Provider, MD  nitroGLYCERIN (NITROSTAT) 0.4 MG SL tablet Place 0.4 mg under the tongue every 5 (five) minutes as needed. For chest pain   Yes Historical Provider, MD  spironolactone (ALDACTONE) 25 MG tablet TAKE ONE TABLET BY MOUTH EVERY DAY IN THE MORNING 01/26/12  Yes Gaylord Shih, MD  traMADol (ULTRAM) 50 MG tablet Take 50 mg by mouth every 8 (eight) hours as needed. For pain 02/13/12  Yes Historical Provider, MD    Allergies:  Allergies  Allergen Reactions  . Codeine     REACTION: G I  distress    History   Social History  . Marital Status: Married    Spouse Name: N/A    Number of Children: N/A  . Years of Education: N/A   Occupational History  . Retired    Social History Main Topics  . Smoking status: Former Smoker -- 1.0 packs/day for 15 years    Quit date: 09/29/1990  . Smokeless tobacco: Never Used    . Alcohol Use: No  . Drug Use: No  . Sexually Active: Not on file   Other Topics Concern  . Not on file   Social History Narrative   [place] with wifeBeen on disability since his stroke in the 1990sNot routinely exercising     Family History  Problem Relation Age of Onset  . Heart attack Mother     CVA, MI  . Diabetes Mother   . Heart attack Father     MI  . Diabetes Sister   . Coronary artery disease Brother     CABG    Review of Systems: General: negative for chills, fever, night sweats Cardiovascular: see above Dermatological: negative for rash Respiratory: negative for cough or wheezing Urologic: negative for hematuria Abdominal: negative for nausea, vomiting, diarrhea, bright red blood per rectum, melena, or hematemesis Neurologic: negative for visual changes, syncope, or dizziness All other systems reviewed and are otherwise negative except as noted above.  Labs:   Lab Results  Component Value Date   WBC 7.0 07/28/2012   HGB 14.3 07/28/2012   HCT 42.1 07/28/2012   MCV 86.4 07/28/2012   PLT 161 07/28/2012     Lab 07/28/12 1623  NA 141  K 4.0  CL 107  CO2 25  BUN 20  CREATININE 1.10  CALCIUM 9.0  PROT --  BILITOT --  ALKPHOS --  ALT --  AST --  GLUCOSE 158*    Basename 07/28/12 1627  CKTOTAL --  CKMB --  TROPONINI <0.30    Radiology/Studies:  Dg Chest 2 View 07/28/2012  *RADIOLOGY REPORT*  Clinical Data: Left chest pain, history of coronary bypass  CHEST - 2 VIEW  Comparison: 02/26/2010  Findings: Coronary bypass changes noted and right subclavian two lead pacer.  Stable mild cardiomegaly with vascular prominence. Minimal basilar atelectasis.  No CHF or focal pneumonia.  No effusion or or pneumothorax.  Postoperative changes in the right lower neck.  Native coronary stents evident.  IMPRESSION: Stable chronic and postoperative findings.  No superimposed CHF or pneumonia.   Original Report Authenticated By: Judie Petit. Ruel Favors, M.D.     EKG: atrial paced nonspecific ST-T change - inf+lat+ant infarct age indeterminant Appears similar to 2011 tracing  Physical Exam: Blood pressure 129/62, pulse 58, temperature 98.6 F (37 C), temperature source Oral, resp. rate 16, height 6\' 2"  (1.88 m), weight 210 lb (95.255 kg), SpO2 96.00%. General: Well developed, well nourished WM in no acute distress. Head: Normocephalic, atraumatic, sclera non-icteric, no xanthomas, nares are without discharge.  Neck: R carotid bruit.s/p R CEA scar JVD not elevated. Lungs: Clear bilaterally to auscultation without wheezes, rales, or rhonchi. Breathing is unlabored. Heart: RRR with S1 S2.  Very soft SEM. No rubs or gallops appreciated. Abdomen: Soft, non-tender, non-distended with normoactive bowel sounds. No hepatomegaly. No rebound/guarding. He does have slightly more prominent abdominal pulsations but no pulsatile mass appreciated. Msk:  Strength and tone appear normal for age. Extremities: No clubbing or cyanosis. Tr-1+ LE edema.  Distal pedal pulses are 2+ and equal bilaterally. Neuro: Alert and oriented X 3. No focal deficit. No facial asymmetry. Moves all extremities spontaneously. Psych:  Responds to questions appropriately with a normal affect.    ASSESSMENT AND PLAN:   1. Prolonged substernal chest pain, unclear etiology 2. CAD s/p CABG 2008 3. ICM s/p ICD EF 35-40% 4. Acute on chronic systolic CHF  5. HTN 6. H/o CVA 7. PVD - carotid disease, ?prominent ab aortic impulse on exam  Despite several hours of pain, there is no objective evidence of ischemia thus far. However, he has a significant cardiac history thus will admit for observation and complete rule-out. If he rules out, would likely be a candidate for pharmacologic stress testing tomorrow. He has very mild volume overload on exam - will rx 1  dose of IV Lasix tonight then resume home dose tomorrow. He was previously taken off lisinopril per records by PCP due to labs. Would  consider trying ARB. Would also recommend abdominal duplex given abd pulsations. F/u carotids can be done as OP.  Signed, Ronie Spies PA-C 07/28/2012, 6:46 PM  Patient seen and examined with Ronie Spies, PA-C. We discussed all aspects of the encounter. I agree with the assessment and plan as stated above.   Mr. Bair chest pain has typical and atypical features. He says pain is different from previous angina but appears to have gotten some relief with NTG. ECG and CE markers normal despite prolonged pain. Will observe overnight and r/o for MI. If cardiac enzymes remain negative and no further CP will plan Lexiscan Myoview in am. Agree with gentle diuresis and ab u/s to exclude AAA.  Daniel Bensimhon,MD 7:04 PM

## 2012-07-28 NOTE — ED Notes (Signed)
Pt in from home via [facility], pt reports L CP non radiating onset today @ 10:00, pt hx of CVA 25 yrs ago with R sided deficits, pt hx of MI x 3 five yrs ago, x4 stent placement, pt is atrial paced, pt hx of DM & CHF, pt received 324 ASA & x1 SL nitro prior to arrival, pt A&O x 4, follows commands, speaks in complete sentences

## 2012-07-28 NOTE — ED Provider Notes (Signed)
History     CSN: [id number]  Arrival date & time 07/28/12  1548   First MD Initiated Contact with Patient 07/28/12 1607      Chief Complaint  Patient presents with  . Chest Pain     HPI Pt was seen at 1510.  Per pt and family, c/o gradual onset and persistence of multiple intermittent episodes of left sided chest "pain" since this morning approx 0900.  Pt states the pain worsens on exertion, improves with rest.  Describes the pain as "my heart pain like when I needed open heart surgery."  States he ran out of his own ntg, but took an ASA.  EMS gave ASA with partial improvement in symptoms.  Denies palpitations, no SOB/cough, no abd pain, no back pain, no N/V/D.    Past Medical History  Diagnosis Date  . Cerebrovascular disease   . Anemia, iron deficiency   . Sinus bradycardia   . Tobacco abuse   . Arm pain   . Chronic renal insufficiency   . Hypertension   . Osteoarthritis   . Diabetes mellitus   . Anemia   . Mitral regurgitation   . Systolic CHF, chronic   . PVD (peripheral vascular disease)   . Cardiomyopathy, ischemic   . Hx of CABG   . Heart attack   . CAD (coronary artery disease)   . Unstable angina   . Stroke     Past Surgical History  Procedure Date  . Coronary artery bypass graft   . Percutaneous coronary intervention 01/10/2005    Charlies Constable, MD  . Coronary artery bypass graft 08/29/2007    x3; Salvatore Decent. Cornelius Moras MD  . Cardiac defibrillator placement 04/05/2008    Implantation of a St. Jude dual-chamber defibrillator, Doylene Canning. Ladona Ridgel , MD  . Cardiac defibrillator placement     Family History  Problem Relation Age of Onset  . Heart attack Mother     CVA, MI  . Diabetes Mother   . Heart attack Father     MI  . Diabetes Sister   . Coronary artery disease Brother     CABG    History  Substance Use Topics  . Smoking status: Former Smoker -- 1.0 packs/day for 15 years    Quit date: 09/29/1990  . Smokeless tobacco: Never Used  . Alcohol Use:  No      Review of Systems ROS: Statement: All systems negative except as marked or noted in the HPI; Constitutional: Negative for fever and chills. ; ; Eyes: Negative for eye pain, redness and discharge. ; ; ENMT: Negative for ear pain, hoarseness, nasal congestion, sinus pressure and sore throat. ; ; Cardiovascular: +CP. Negative for palpitations, diaphoresis, dyspnea and peripheral edema. ; ; Respiratory: Negative for cough, wheezing and stridor. ; ; Gastrointestinal: Negative for nausea, vomiting, diarrhea, abdominal pain, blood in stool, hematemesis, jaundice and rectal bleeding. . ; ; Genitourinary: Negative for dysuria, flank pain and hematuria. ; ; Musculoskeletal: Negative for back pain and neck pain. Negative for swelling and trauma.; ; Skin: Negative for pruritus, rash, abrasions, blisters, bruising and skin lesion.; ; Neuro: Negative for headache, lightheadedness and neck stiffness. Negative for weakness, altered level of consciousness , altered mental status, extremity weakness, paresthesias, involuntary movement, seizure and syncope.       Allergies  Codeine  Home Medications   Current Outpatient Rx  Name Route Sig Dispense Refill  . ASPIRIN 325 MG PO TABS Oral Take 325 mg by mouth daily.      [name]  ATORVASTATIN CALCIUM 80 MG PO TABS Oral Take 80 mg by mouth daily.     [name] BACLOFEN 20 MG PO TABS Oral Take 20 mg by mouth 2 (two) times daily.    [name] CARVEDILOL 12.5 MG PO TABS  TAKE ONE TABLET BY MOUTH TWICE DAILY WITH MEALS 60 tablet 5  . CLOPIDOGREL BISULFATE 75 MG PO TABS Oral Take 1 tablet (75 mg total) by mouth daily. 30 tablet 3  . FUROSEMIDE 40 MG PO TABS Oral Take 40 mg by mouth daily.    [name] GLIMEPIRIDE 4 MG PO TABS Oral Take 4 mg by mouth 2 (two) times daily.     [name] MECLIZINE HCL 25 MG PO TABS Oral Take 25 mg by mouth 4 (four) times daily as needed. For dizziness    . NITROGLYCERIN 0.4 MG SL SUBL Sublingual Place 0.4 mg under the tongue every 5 (five) minutes as needed. For chest  pain    . SPIRONOLACTONE 25 MG PO TABS  TAKE ONE TABLET BY MOUTH EVERY DAY IN THE MORNING 30 tablet 12  . TRAMADOL HCL 50 MG PO TABS Oral Take 50 mg by mouth every 8 (eight) hours as needed. For pain      BP 130/68  Temp 98.6 F (37 C) (Oral)  Resp 18  Ht 6\' 2"  (1.88 m)  Wt 210 lb (95.255 kg)  BMI 26.96 kg/m2  SpO2 98%  Physical Exam 1615: Physical examination:  Nursing notes reviewed; Vital signs and O2 SAT reviewed;  Constitutional: Well developed, Well nourished, Well hydrated, In no acute distress; Head:  Normocephalic, atraumatic; Eyes: EOMI, PERRL, No scleral icterus; ENMT: Mouth and pharynx normal, Mucous membranes moist; Neck: Supple, Full range of motion, No lymphadenopathy; Cardiovascular: Regular rate and rhythm, No gallop; Respiratory: Breath sounds clear & equal bilaterally, No wheezes.  Speaking full sentences with ease, Normal respiratory effort/excursion; Chest: Nontender, Movement normal; Abdomen: Soft, Nontender, Nondistended, Normal bowel sounds; Genitourinary: No CVA tenderness; Extremities: Pulses normal, No tenderness, +1 pedal edema bilat; Neuro: AA&Ox3, vague historian. Major CN grossly intact.  Speech clear. Right sided weakness per previous hx CVA..; Skin: Color normal, Warm, Dry.   ED Course  Procedures    MDM  MDM Reviewed: nursing note, vitals and previous chart Reviewed previous: ECG Interpretation: ECG, labs and x-ray    Date: 07/28/2012  Rate: 60  Rhythm: atrial-paced rhythm  QRS Axis: left  Intervals: PR prolonged  ST/T Wave abnormalities: nonspecific ST/T changes  Conduction Disutrbances:first-degree A-V block   Narrative Interpretation:   Old EKG Reviewed: unchanged; no significant changes from previous EKG dated 02/26/2010.   Results for orders placed during the hospital encounter of 07/28/12  BASIC METABOLIC PANEL      Component Value Range   Sodium 141  135 - 145 mEq/L   Potassium 4.0  3.5 - 5.1 mEq/L   Chloride 107  96 - 112 mEq/L    CO2 25  19 - 32 mEq/L   Glucose, Bld 158 (*) 70 - 99 mg/dL   BUN 20  6 - 23 mg/dL   Creatinine, Ser 0.45  0.50 - 1.35 mg/dL   Calcium 9.0  8.4 - 40.9 mg/dL   GFR calc non Af Amer 69 (*) >90 mL/min   GFR calc Af Amer 79 (*) >90 mL/min  CBC WITH DIFFERENTIAL      Component Value Range   WBC 7.0  4.0 - 10.5 K/uL   RBC 4.87  4.22 - 5.81 MIL/uL   Hemoglobin 14.3  13.0 -  17.0 g/dL   HCT 40.9  81.1 - 91.4 %   MCV 86.4  78.0 - 100.0 fL   MCH 29.4  26.0 - 34.0 pg   MCHC 34.0  30.0 - 36.0 g/dL   RDW 78.2  95.6 - 21.3 %   Platelets 161  150 - 400 K/uL   Neutrophils Relative 69  43 - 77 %   Neutro Abs 4.8  1.7 - 7.7 K/uL   Lymphocytes Relative 18  12 - 46 %   Lymphs Abs 1.2  0.7 - 4.0 K/uL   Monocytes Relative 11  3 - 12 %   Monocytes Absolute 0.8  0.1 - 1.0 K/uL   Eosinophils Relative 2  0 - 5 %   Eosinophils Absolute 0.1  0.0 - 0.7 K/uL   Basophils Relative 0  0 - 1 %   Basophils Absolute 0.0  0.0 - 0.1 K/uL  TROPONIN I      Component Value Range   Troponin I <0.30  <0.30 ng/mL   Dg Chest 2 View 07/28/2012  *RADIOLOGY REPORT*  Clinical Data: Left chest pain, history of coronary bypass  CHEST - 2 VIEW  Comparison: 02/26/2010  Findings: Coronary bypass changes noted and right subclavian two lead pacer.  Stable mild cardiomegaly with vascular prominence. Minimal basilar atelectasis.  No CHF or focal pneumonia.  No effusion or or pneumothorax.  Postoperative changes in the right lower neck.  Native coronary stents evident.  IMPRESSION: Stable chronic and postoperative findings.  No superimposed CHF or pneumonia.   Original Report Authenticated By: Judie Petit. Ruel Favors, M.D.      1755:  Continues improved after EMS sl ntg. Pt with significant hx of CAD with multiple cardiac risk factors.  Dx and testing d/w pt and family.  Questions answered.  Verb understanding, agreeable to admit.  T/C to [facility] Dr. Excell Seltzer, case discussed, including:  HPI, pertinent PM/SHx, VS/PE, dx testing, ED course and  treatment:  Agreeable to admit, they will come to ED for eval.   Laray Anger, DO 07/30/12 2330

## 2012-07-29 ENCOUNTER — Observation Stay (HOSPITAL_COMMUNITY): Payer: Medicare Other

## 2012-07-29 ENCOUNTER — Encounter (HOSPITAL_COMMUNITY): Payer: Medicare Other

## 2012-07-29 DIAGNOSIS — E876 Hypokalemia: Secondary | ICD-10-CM | POA: Diagnosis not present

## 2012-07-29 DIAGNOSIS — R079 Chest pain, unspecified: Secondary | ICD-10-CM

## 2012-07-29 LAB — CBC
MCV: 85.8 fL (ref 78.0–100.0)
Platelets: 165 10*3/uL (ref 150–400)
RBC: 4.86 MIL/uL (ref 4.22–5.81)
RDW: 13.8 % (ref 11.5–15.5)
WBC: 5.6 10*3/uL (ref 4.0–10.5)

## 2012-07-29 LAB — GLUCOSE, CAPILLARY
Glucose-Capillary: 144 mg/dL — ABNORMAL HIGH (ref 70–99)
Glucose-Capillary: 156 mg/dL — ABNORMAL HIGH (ref 70–99)
Glucose-Capillary: 164 mg/dL — ABNORMAL HIGH (ref 70–99)

## 2012-07-29 LAB — BASIC METABOLIC PANEL
Chloride: 101 mEq/L (ref 96–112)
Creatinine, Ser: 1.1 mg/dL (ref 0.50–1.35)
GFR calc Af Amer: 79 mL/min — ABNORMAL LOW (ref >90)
Sodium: 138 mEq/L (ref 135–145)

## 2012-07-29 LAB — URINE CULTURE
Colony Count: NO GROWTH
Culture: NO GROWTH

## 2012-07-29 LAB — TROPONIN I
Troponin I: <0.3 ng/mL (ref <0.30)
Troponin I: <0.3 ng/mL (ref <0.30)

## 2012-07-29 MED ORDER — ATORVASTATIN CALCIUM 40 MG PO TABS
40.0000 mg | ORAL_TABLET | ORAL | Status: DC
  Administered 2012-07-29: 40 mg via ORAL
  Filled 2012-07-29: qty 1

## 2012-07-29 MED ORDER — TECHNETIUM TC 99M SESTAMIBI GENERIC - CARDIOLITE
10.0000 | Freq: Once | INTRAVENOUS | Status: AC | PRN
  Administered 2012-07-29: 10 via INTRAVENOUS

## 2012-07-29 MED ORDER — PNEUMOCOCCAL VAC POLYVALENT 25 MCG/0.5ML IJ INJ: 0.5000 mL | INJECTION | INTRAMUSCULAR | Status: DC

## 2012-07-29 MED ORDER — REGADENOSON 0.4 MG/5ML IV SOLN
INTRAVENOUS | Status: AC
  Administered 2012-07-29: 0.4 mg via INTRAVENOUS
  Filled 2012-07-29: qty 5

## 2012-07-29 MED ORDER — BACLOFEN 20 MG PO TABS
40.0000 mg | ORAL_TABLET | Freq: Two times a day (BID) | ORAL | Status: DC
  Filled 2012-07-29: qty 2

## 2012-07-29 MED ORDER — ASPIRIN 81 MG PO TBEC: 81.0000 mg | DELAYED_RELEASE_TABLET | Freq: Every day | ORAL | Status: DC

## 2012-07-29 MED ORDER — ATORVASTATIN CALCIUM 40 MG PO TABS: 40.0000 mg | ORAL_TABLET | ORAL | Status: DC

## 2012-07-29 MED ORDER — TECHNETIUM TC 99M SESTAMIBI GENERIC - CARDIOLITE
30.0000 | Freq: Once | INTRAVENOUS | Status: AC | PRN
  Administered 2012-07-29: 30 via INTRAVENOUS

## 2012-07-29 NOTE — Progress Notes (Signed)
Patient ID: [name], male   DOB: Aug 13, 1946, 66 y.o.   MRN: [id number] Subjective:  He denies additional chest pain. No syncope.  Objective:  Vital Signs in the last 24 hours: Temp:  [97.7 F (36.5 C)-98.6 F (37 C)] 97.7 F (36.5 C) (10/31 0500) Pulse Rate:  [58-72] 64  (10/31 1336) Resp:  [15-22] 22  (10/30 2000) BP: (119-141)/(62-71) 138/71 mmHg (10/31 1336) SpO2:  [94 %-100 %] 99 % (10/31 0500) Weight:  [209 lb (94.802 kg)-210 lb (95.255 kg)] 209 lb (94.802 kg) (10/31 0500)  Intake/Output from previous day: 10/30 0701 - 10/31 0700 In: -  Out: 2100 [Urine:2100] Intake/Output from this shift: Total I/O In: -  Out: 500 [Urine:500]  Physical Exam: Well appearing middle aged man, NAD HEENT: Unremarkable Neck:  No JVD, no thyromegally Lungs:  Clear with no wheezes, rales, or rhonchi HEART:  Regular rate rhythm, no murmurs, no rubs, no clicks Abd:  Flat, positive bowel sounds, no organomegally, no rebound, no guarding Ext:  2 plus pulses, no edema, no cyanosis, no clubbing Skin:  No rashes no nodules Neuro:  CN II through XII intact, motor grossly intact  Lab Results:  Basename 07/29/12 0256 07/28/12 2128  WBC 5.6 6.2  HGB 14.3 14.1  PLT 165 170    Basename 07/29/12 0256 07/28/12 2128 07/28/12 1623  NA 138 -- 141  K 3.4* -- 4.0  CL 101 -- 107  CO2 27 -- 25  GLUCOSE 163* -- 158*  BUN 17 -- 20  CREATININE 1.10 1.05 --    Basename 07/29/12 0845 07/29/12 0256  TROPONINI <0.30 <0.30   Hepatic Function Panel No results found for this basename: PROT,ALBUMIN,AST,ALT,ALKPHOS,BILITOT,BILIDIR,IBILI in the last 72 hours No results found for this basename: CHOL in the last 72 hours No results found for this basename: PROTIME in the last 72 hours  Imaging: Dg Chest 2 View  07/28/2012  *RADIOLOGY REPORT*  Clinical Data: Left chest pain, history of coronary bypass  CHEST - 2 VIEW  Comparison: 02/26/2010  Findings: Coronary bypass changes noted and right  subclavian two lead pacer.  Stable mild cardiomegaly with vascular prominence. Minimal basilar atelectasis.  No CHF or focal pneumonia.  No effusion or or pneumothorax.  Postoperative changes in the right lower neck.  Native coronary stents evident.  IMPRESSION: Stable chronic and postoperative findings.  No superimposed CHF or pneumonia.   Original Report Authenticated By: Judie Petit. Ruel Favors, M.D.    US Aorta  07/29/2012  *RADIOLOGY REPORT*  Clinical Data:  Abdominal pulsations.  Question aneurysm.  ULTRASOUND OF ABDOMINAL AORTA  Technique:  Ultrasound examination of the abdominal aorta was performed to evaluate for abdominal aortic aneurysm.  Comparison: CT 08/25/2007  Abdominal Aorta:  No aneurysm identified.        Maximum AP diameter:  2.0 cm.       Maximum TRV diameter:  2.2 cm.  IMPRESSION: No abdominal aortic aneurysm identified.   Original Report Authenticated By: Charlett Nose, M.D.     Cardiac Studies: Tele - NSR Assessment/Plan:  1. Chest pain 2. Known CAD, ruled out for MI 3. Chronic systolic CHF, well compensated Rec: note plans for stress test today. If negative he can be discharged.  LOS: 1 day    Abdulhadi Stopa,M.D. 07/29/2012, 1:47 PM

## 2012-07-29 NOTE — Progress Notes (Signed)
Utilization review completed.  

## 2012-07-29 NOTE — Progress Notes (Signed)
Lexiscan Myoview performed. 

## 2012-07-29 NOTE — Discharge Summary (Signed)
Discharge Summary   Patient ID: [name],  MRN: [id number], DOB/AGE: Dec 29, 1945 66 y.o.  Admit date: 07/28/2012 Discharge date: 07/29/2012  Primary Physician: [name] Jester, DO Primary Cardiologist: [name] (EP)  Discharge Diagnoses Principal Problem:  *Angina pectoris Active Problems:  DIABETES MELLITUS  HYPERTENSION  CAD  BRUIT  Hypokalemia  Acute on chronic systolic CHF (congestive heart failure)   Allergies Allergies  Allergen Reactions  . Codeine     REACTION: G I  distress    Diagnostic Studies/Procedures  PA/LATERAL CHEST X-RAY - 07/28/12  Comparison: 02/26/2010  Findings: Coronary bypass changes noted and right subclavian two  lead pacer. Stable mild cardiomegaly with vascular prominence.  Minimal basilar atelectasis. No CHF or focal pneumonia. No  effusion or or pneumothorax. Postoperative changes in the right  lower neck. Native coronary stents evident.  IMPRESSION:  Stable chronic and postoperative findings. No superimposed CHF or  pneumonia.  ULTRASOUND OF ABDOMINAL AORTA - 07/29/12  Technique: Ultrasound examination of the abdominal aorta was  performed to evaluate for abdominal aortic aneurysm.  Comparison: CT 08/25/2007  Abdominal Aorta: No aneurysm identified.  Maximum AP diameter: 2.0 cm.  Maximum TRV diameter: 2.2 cm.  IMPRESSION:  No abdominal aortic aneurysm identified.  LEXISCAN MYOVIEW - 07/29/12  Final interpretation: Abnormal Lexiscan Myoview with chest pain  but no diagnostic electrocardiographic changes. The scintigraphic  results show extensive prior distal anteroseptal, apical and  inferior infarcts. No ischemia noted. The gated ejection fraction  was 37% and there is apical and inferior akinesis and septal  hypokinesis. There is evidence of left ventricular enlargement.  History of Present Illness  [name] is a 66yo male admitted to [facility] with the above problem list. He has a history of CAD s/p CABG 2008,  ICM (EF 35-40%) s/p ICD, DM and hx of CVA who presented to [facility] ED on 07/28/12 with c/o chest pain. He was in his [employer], watching TV when he developed gradually increasing L-sided chest pain radiating to L arm w/ assoc SOB. This persisted for 6 hours. No other assoc sxs or ICD discharge. No attempt at alleviation. He reported this was reminiscent of prior angina, but less severe. Given increasing pain, he called 911 and was transported to [name]. He received low-dose ASA x 4 and NTG SL x 1 en route with gradual improvement in pain.   He was stable, and pain free on evaluation in the ED. EKG revealed A-paced rhythm, no ischemic changes and initial trop-I WNL. CXR as above revealed stable chronic post-op findings, without acute underlying abnormality. On exam, R carotid bruit and abdominal pulsations were noted. He was felt to be mildly volume overloaded. The patient was noted to have both typical and atypical features consistent w/ unstable angina. Taking this into account along with his cardiac history and risk factors, he was placed in observation w/ plans for stress testing the following day.   Hospital Course   He received a dose of IV Lasix, and was resumed on his outpatient regimen. Cardiac biomarkers were cycled and returned WNL. He remained NPO the following day and effectively ruled out. Dr. Ladona [name] evaluated him, and he was noted to be euvolemic (Total I/O: - 2600 cc). Abdominal u/s, as above was performed and revealed no evidence of AAA. Lexiscan Myoview was performed as noted above. The patient did experience chest pain during the study. No ST changes were noted, and it was terminated per protocol. Formal interpretation revealed extensive distal anteroseptal, apical and inferior infarcts.  No ischemia. LVEF 37% with apical, inferior AK and septal HK. LV dilatation noted.   He remained stable clinically, and Dr. Ladona [name] deemed him appropriate for discharge today. The patient does have underlying CAD, and  may likely have chronic angina pectoris, however his symptoms did have atypical components not ruling out alternative etiologies. Additionally, there was no objective evidence of ischemia this admission. He will continue all outpatient medications. He will follow-up in 2-4 weeks as scheduled below. Of note, the patient's ACEi had been discontinued by his PCP. Consideration for adding ARB, and further antianginal therapy, will be pursued on follow-up in an effort to relieve potential underlying angina. Additionally, the recommendation was made to perform carotid dopplers as an outpatient given R carotid bruit. This information has been clearly outlined in the discharge AVS.   Discharge Vitals:  Blood pressure 130/74, pulse 66, temperature 98.1 F (36.7 C), temperature source Oral, resp. rate 20, height 6\' 2"  (1.88 m), weight 94.802 kg (209 lb), SpO2 94.00%.   Labs: Recent Labs  Basename 07/29/12 0256 07/28/12 2128   WBC 5.6 6.2   HGB 14.3 14.1   HCT 41.7 41.4   MCV 85.8 86.3   PLT 165 170    Lab 07/29/12 0256 07/28/12 2128 07/28/12 1623  NA 138 -- 141  K 3.4* -- 4.0  CL 101 -- 107  CO2 27 -- 25  BUN 17 -- 20  CREATININE 1.10 1.05 1.10  CALCIUM 9.2 -- 9.0  PROT -- -- --  BILITOT -- -- --  ALKPHOS -- -- --  ALT -- -- --  AST -- -- --  AMYLASE -- -- --  LIPASE -- -- --  GLUCOSE 163* -- 158*   Recent Labs  Basename 07/29/12 0845 07/29/12 0256 07/28/12 2129   CKTOTAL -- -- --   CKMB -- -- --   CKMBINDEX -- -- --   TROPONINI <0.30 <0.30 <0.30   Disposition:  Discharge Orders    Future Appointments: Provider: Department: Dept Phone: Center:   08/05/2012 10:00 AM [facility] [phone] None   08/19/2012 11:30 AM Beatrice Lecher, PA [facility] [place] [phone] [facility]   09/02/2012 10:00 AM [facility] [phone] None   09/09/2012 11:45 AM Gaylord Shih, MD [facility] [phone] [facility]    10/06/2012 9:00 AM Krista Blue [facility] [phone] None   10/06/2012 9:15 AM [facility] [phone] None   11/03/2012 10:00 AM [facility] [phone] None   12/01/2012 10:00 AM [facility] [phone] None   01/06/2013 8:30 AM Krista Blue [facility] [phone] None   01/06/2013 9:00 AM Samul Dada, MD [facility] [phone] None   01/06/2013 10:00 AM [facility] [phone] None     Follow-up Information    Follow up with Tereso Newcomer, PA. (At 11:30 AM for follow-up after this hospitalization. )    Contact information:   [address] [place] [address] [phone]         Discharge Medications:    Medication List     As of 07/29/2012  5:19 PM    START taking these medications         aspirin 81 MG EC tablet   Take 1 tablet (81 mg total) by mouth daily.   Replaces: aspirin 325 MG tablet      CONTINUE taking these medications  atorvastatin 80 MG tablet   Commonly known as: LIPITOR      baclofen 20 MG tablet   Commonly known as: LIORESAL      carvedilol 12.5 MG tablet   Commonly known as: COREG   TAKE ONE TABLET BY MOUTH TWICE DAILY WITH MEALS      clopidogrel 75 MG tablet   Commonly known as: PLAVIX   Take 1 tablet (75 mg total) by mouth daily.      furosemide 40 MG tablet   Commonly known as: LASIX      glimepiride 4 MG tablet   Commonly known as: AMARYL      meclizine 25 MG tablet   Commonly known as: ANTIVERT      nitroGLYCERIN 0.4 MG SL tablet   Commonly known as: NITROSTAT      spironolactone 25 MG tablet   Commonly known as: ALDACTONE   TAKE ONE TABLET BY MOUTH EVERY DAY IN THE MORNING      traMADol 50 MG tablet   Commonly known as: ULTRAM      STOP taking these medications         aspirin 325 MG tablet   Replaced by: aspirin 81 MG EC tablet          Where to  get your medications       Information on where to get these meds is not yet available. Ask your nurse or doctor.         aspirin 81 MG EC tablet           Outstanding Labs/Studies: None; consider carotid dopplers  Duration of Discharge Encounter: Greater than 30 minutes including physician time.  Signed, R. Hurman Horn, PA-C 07/29/2012, 5:19 PM

## 2012-07-30 NOTE — Telephone Encounter (Signed)
lmovm tcb Mylo Red RN

## 2012-08-02 ENCOUNTER — Other Ambulatory Visit: Payer: Self-pay | Admitting: Oncology

## 2012-08-05 ENCOUNTER — Ambulatory Visit (HOSPITAL_BASED_OUTPATIENT_CLINIC_OR_DEPARTMENT_OTHER): Payer: Medicare Other

## 2012-08-05 VITALS — BP 145/79 | HR 70 | Temp 97.4°F | Resp 20

## 2012-08-05 DIAGNOSIS — D649 Anemia, unspecified: Secondary | ICD-10-CM

## 2012-08-05 DIAGNOSIS — E538 Deficiency of other specified B group vitamins: Secondary | ICD-10-CM

## 2012-08-05 MED ORDER — CYANOCOBALAMIN 1000 MCG/ML IJ SOLN
1000.0000 ug | Freq: Once | INTRAMUSCULAR | Status: AC
  Administered 2012-08-05: 1000 ug via INTRAMUSCULAR

## 2012-08-09 ENCOUNTER — Ambulatory Visit: Payer: Medicare Other

## 2012-08-19 ENCOUNTER — Encounter: Payer: Medicare Other | Admitting: Physician Assistant

## 2012-08-19 ENCOUNTER — Ambulatory Visit (INDEPENDENT_AMBULATORY_CARE_PROVIDER_SITE_OTHER): Payer: Medicare Other | Admitting: Physician Assistant

## 2012-08-19 ENCOUNTER — Encounter: Payer: Self-pay | Admitting: Physician Assistant

## 2012-08-19 VITALS — BP 130/68 | HR 63 | Ht 74.0 in | Wt 200.6 lb

## 2012-08-19 DIAGNOSIS — N189 Chronic kidney disease, unspecified: Secondary | ICD-10-CM

## 2012-08-19 DIAGNOSIS — I10 Essential (primary) hypertension: Secondary | ICD-10-CM

## 2012-08-19 DIAGNOSIS — I6529 Occlusion and stenosis of unspecified carotid artery: Secondary | ICD-10-CM

## 2012-08-19 DIAGNOSIS — I2589 Other forms of chronic ischemic heart disease: Secondary | ICD-10-CM

## 2012-08-19 DIAGNOSIS — I2510 Atherosclerotic heart disease of native coronary artery without angina pectoris: Secondary | ICD-10-CM

## 2012-08-19 DIAGNOSIS — I5022 Chronic systolic (congestive) heart failure: Secondary | ICD-10-CM

## 2012-08-19 LAB — BASIC METABOLIC PANEL
BUN: 24 mg/dL — ABNORMAL HIGH (ref 6–23)
Calcium: 9 mg/dL (ref 8.4–10.5)
GFR: 63.95 mL/min (ref >60.00)
Glucose, Bld: 156 mg/dL — ABNORMAL HIGH (ref 70–99)

## 2012-08-19 MED ORDER — ISOSORBIDE MONONITRATE ER 30 MG PO TB24: 30.0000 mg | ORAL_TABLET | Freq: Every day | ORAL | Status: DC

## 2012-08-19 NOTE — Patient Instructions (Addendum)
START IMDUR 30 MG 1 TABLET DAILY  LAB TODAY; BMET  Your physician has requested that you have a carotid duplex DX 433.10. This test is an ultrasound of the carotid arteries in your neck. It looks at blood flow through these arteries that supply the brain with blood. Allow one hour for this exam. There are no restrictions or special instructions.  KEEP APPT WITH DR. WALL 09/09/12

## 2012-08-19 NOTE — Progress Notes (Signed)
[address] [place], [place]  [address] Phone: [phone], Fax:  [phone]  Date:  08/19/2012   Name:  [name]   DOB:  1945-11-15   MRN:  [id number]  PCP:  [name], DO  Primary Cardiologist:  Dr. Valera Castle  Primary Electrophysiologist:  None    History of Present Illness: [name] is a 66 y.o. male who returns for followup after a recent admission to the hospital for chest discomfort and acute on chronic systolic CHF.  He has a hx of CAD, s/p CABG 2008, systolic CHF, ICM (EF 35-40%) s/p ICD, CKD, DM, CVA.  Last LHC in 7/09 demonstrated EF 35-45%, L-LAD ok, S-OM ok, S-PDA ok, scattered disease of the dist AV CFX with mod stenosis but small to mod vessels.   He was admitted 10/30-10/31.  He presented with prolonged chest pain and 4 lb weight gain. Symptoms were relieved somewhat with sublingual nitroglycerin. Myocardial infarction was ruled out. He was felt to be slightly volume overloaded and he was gently diuresed. Inpatient Myoview 07/29/12: Anteroseptal, apical, inferior scar, no ischemia, EF 37%. This was felt to be consistent with his prior scans and no further workup was recommended. Of note, the patient was felt to have a prominent abdominal aorta. Ultrasound to rule out AAA was normal. Patient was also noted to have a right carotid bruit. Dopplers 11/12:0-39% bilateral ICA stenosis. Followup carotid Dopplers have been recommended.  Since d/c, he is doing well.  The patient denies chest pain, significant shortness of breath, syncope, orthopnea, PND or significant pedal edema. He is probably Class IIb.    Labs (10/13):    K 3.4, creatinine 1.10, ALT 47, Hgb 14.3  Wt Readings from Last 3 Encounters:  07/29/12 209 lb (94.802 kg)  07/07/12 209 lb 3.2 oz (94.892 kg)  03/26/12 208 lb (94.348 kg)     Past Medical History  Diagnosis Date  . Cerebrovascular disease   . Sinus bradycardia   . Tobacco abuse   . Chronic renal  insufficiency   . Hypertension   . Osteoarthritis   . Diabetes mellitus   . Anemia     Multifactorial  - hx of B12 def and iron deficiency, followed by heme  . Mitral regurgitation   . Systolic CHF, chronic     Previously taken off lisinopril by primary doctor due to labs  . PVD (peripheral vascular disease)     a. Prior carotid dz/ surgery. b. Carotid dopp 07/2011 - 0-39% bilaterally.  . Cardiomyopathy, ischemic     a. Chronic systolic CHF s/p [employer] Jude dual chamber ICD 03/2008.  [name] Hx of CABG   . CAD (coronary artery disease)     a. STEMI s/p CABG 07/2007 (had preop cardiogenic shock, IABP, VDRF before surgery)  . Stroke     a. CVA 1990s - chronic pain in RUE after stroke.  [name] PAF (paroxysmal atrial fibrillation)     a. Poor Coumadin candidate due to hx of GI bleed.  . GI bleed     Reported history  . Splenomegaly     Current Outpatient Prescriptions  Medication Sig Dispense Refill  . aspirin EC 81 MG EC tablet Take 1 tablet (81 mg total) by mouth daily.      [name] atorvastatin (LIPITOR) 80 MG tablet Take 80 mg by mouth daily.       . baclofen (LIORESAL) 20 MG tablet Take 20 mg by mouth 2 (two) times daily.      [name]  carvedilol (COREG) 12.5 MG tablet TAKE ONE TABLET BY MOUTH TWICE DAILY WITH MEALS  60 tablet  5  . clopidogrel (PLAVIX) 75 MG tablet Take 1 tablet (75 mg total) by mouth daily.  30 tablet  3  . furosemide (LASIX) 40 MG tablet Take 40 mg by mouth daily.      [name] glimepiride (AMARYL) 4 MG tablet Take 4 mg by mouth 2 (two) times daily.       . meclizine (ANTIVERT) 25 MG tablet Take 25 mg by mouth 4 (four) times daily as needed. For dizziness      . nitroGLYCERIN (NITROSTAT) 0.4 MG SL tablet Place 0.4 mg under the tongue every 5 (five) minutes as needed. For chest pain      . spironolactone (ALDACTONE) 25 MG tablet TAKE ONE TABLET BY MOUTH EVERY DAY IN THE MORNING  30 tablet  12  . traMADol (ULTRAM) 50 MG tablet Take 50 mg by mouth every 8 (eight) hours as needed. For pain         Allergies: Allergies  Allergen Reactions  . Codeine     REACTION: G I  distress    Social History:  The patient  reports that he quit smoking about 21 years ago. He has never used smokeless tobacco. He reports that he does not drink alcohol or use illicit drugs.   ROS:  Please see the history of present illness.   All other systems reviewed and negative.   PHYSICAL EXAM: VS:  BP 130/68  Pulse 63  Ht 6\' 2"  (1.88 m)  Wt 200 lb 9.6 oz (90.992 kg)  BMI 25.76 kg/m2 Well nourished, well developed, in no acute distress HEENT: normal Neck: no JVD Cardiac:  normal S1, S2; RRR; no murmur Lungs:  clear to auscultation bilaterally, no wheezing, rhonchi or rales Abd: soft, nontender, no hepatomegaly Ext: no edema Skin: warm and dry Neuro:  CNs 2-12 intact, no focal abnormalities noted  EKG:  NSR, HR 63, inferior Q waves, anterolateral Q waves, T wave inversions V2-V6, no significant change when compared to prior tracings.     ASSESSMENT AND PLAN:  1. Coronary Artery Disease:   No angina.  Recent myoview low risk.  Continue med Rx.  Continue ASA and statin.  2. Chronic Systolic CHF:   Stable volume.  K+ low in the hospital.  Check bmet today.    3. Ischemic Cardiomyopathy:   He was taken off his ACE due to acute renal insufficiency.  I have reviewed his chart.  I am not certain it is worth trying an ARB as I fear he will have problems with his renal fxn.  Start Isosorbide 30 mg QD.  He does not take PDE-5 inhibitors.  Consider hydralazine if BP will allow.    4. Carotid stenosis:   Arrange followup carotid Dopplers.    5. Hypertension:   Controlled.  Continue current therapy.   6. S/p AICD:   Follow up with Dr. Lewayne Bunting as planned.  7. Disposition:  I offered follow up in about 6-8 weeks with me.  He would like to keep his appt with Dr. Valera Castle next month as planned.   Luna Glasgow, PA-C  9:49 AM 08/19/2012

## 2012-08-20 ENCOUNTER — Telehealth: Payer: Self-pay | Admitting: *Deleted

## 2012-08-20 NOTE — Telephone Encounter (Signed)
pt notified about lab results and to f/u w/PCP about elevated glucose, pt verbalized understanding

## 2012-08-20 NOTE — Telephone Encounter (Signed)
Message copied by Tarri Fuller on Fri Aug 20, 2012  8:28 AM ------      Message from: [place], [place] T      Created: Thu Aug 19, 2012  5:31 PM       K+ and renal fxn ok.      Follow up with PCP for elevated sugar      Tereso Newcomer, PA-C  5:31 PM 08/19/2012

## 2012-08-27 ENCOUNTER — Encounter (INDEPENDENT_AMBULATORY_CARE_PROVIDER_SITE_OTHER): Payer: Medicare Other

## 2012-08-27 DIAGNOSIS — I6529 Occlusion and stenosis of unspecified carotid artery: Secondary | ICD-10-CM

## 2012-08-31 ENCOUNTER — Encounter: Payer: Self-pay | Admitting: Physician Assistant

## 2012-09-01 ENCOUNTER — Telehealth: Payer: Self-pay | Admitting: *Deleted

## 2012-09-01 NOTE — Telephone Encounter (Signed)
Message copied by Tarri Fuller on Wed Sep 01, 2012  9:09 AM ------      Message from: [place], [place] T      Created: Tue Aug 31, 2012  2:00 PM       40-59% LICA      0-39% RICA      Stable      Repeat in 1 year      Tereso Newcomer, PA-C  2:00 PM 08/31/2012

## 2012-09-01 NOTE — Telephone Encounter (Signed)
pt notified about carotid doppler results stable, repeat carotid 1 yr

## 2012-09-02 ENCOUNTER — Ambulatory Visit (HOSPITAL_BASED_OUTPATIENT_CLINIC_OR_DEPARTMENT_OTHER): Payer: Medicare Other

## 2012-09-02 VITALS — BP 125/74 | HR 67 | Temp 97.6°F

## 2012-09-02 DIAGNOSIS — E538 Deficiency of other specified B group vitamins: Secondary | ICD-10-CM

## 2012-09-02 DIAGNOSIS — D649 Anemia, unspecified: Secondary | ICD-10-CM

## 2012-09-02 MED ORDER — CYANOCOBALAMIN 1000 MCG/ML IJ SOLN
1000.0000 ug | Freq: Once | INTRAMUSCULAR | Status: AC
  Administered 2012-09-02: 1000 ug via INTRAMUSCULAR

## 2012-09-07 ENCOUNTER — Encounter: Payer: Self-pay | Admitting: Cardiology

## 2012-09-08 ENCOUNTER — Ambulatory Visit: Payer: Medicare Other

## 2012-09-09 ENCOUNTER — Encounter: Payer: Self-pay | Admitting: Cardiology

## 2012-09-09 ENCOUNTER — Ambulatory Visit (INDEPENDENT_AMBULATORY_CARE_PROVIDER_SITE_OTHER): Payer: Medicare Other | Admitting: Cardiology

## 2012-09-09 VITALS — BP 128/68 | HR 72 | Ht 74.0 in | Wt 205.0 lb

## 2012-09-09 DIAGNOSIS — I2510 Atherosclerotic heart disease of native coronary artery without angina pectoris: Secondary | ICD-10-CM

## 2012-09-09 DIAGNOSIS — I252 Old myocardial infarction: Secondary | ICD-10-CM

## 2012-09-09 DIAGNOSIS — N189 Chronic kidney disease, unspecified: Secondary | ICD-10-CM

## 2012-09-09 DIAGNOSIS — I509 Heart failure, unspecified: Secondary | ICD-10-CM

## 2012-09-09 DIAGNOSIS — I10 Essential (primary) hypertension: Secondary | ICD-10-CM

## 2012-09-09 DIAGNOSIS — I5023 Acute on chronic systolic (congestive) heart failure: Secondary | ICD-10-CM

## 2012-09-09 DIAGNOSIS — Z951 Presence of aortocoronary bypass graft: Secondary | ICD-10-CM

## 2012-09-09 DIAGNOSIS — I739 Peripheral vascular disease, unspecified: Secondary | ICD-10-CM

## 2012-09-09 DIAGNOSIS — I679 Cerebrovascular disease, unspecified: Secondary | ICD-10-CM

## 2012-09-09 DIAGNOSIS — I080 Rheumatic disorders of both mitral and aortic valves: Secondary | ICD-10-CM

## 2012-09-09 DIAGNOSIS — Z95810 Presence of automatic (implantable) cardiac defibrillator: Secondary | ICD-10-CM

## 2012-09-09 NOTE — Assessment & Plan Note (Signed)
Stable. Continue self-management. Instructions reinforced.

## 2012-09-09 NOTE — Assessment & Plan Note (Signed)
Stable. Continue secondary preventative therapy. 

## 2012-09-09 NOTE — Progress Notes (Signed)
HPI [name] returns today for evaluation and management of his complex cardiovascular history. Other than chronic knee pain and decreased ambulation , he's doing fairly well. He was hospitalized in October for congestive heart failure. He presented with some shortness of breath and abdominal swelling.  During that admission, he ruled out for myocardial infarction. Stress Myoview showed an EF of 37% with LV enlargement with an old anterior apical septal and inferior [name] infarct pattern. There was no ischemia. Recent carotid Doppler showed nonobstructive disease.  Is very compliant with his diet and has learned to self management self with his heart failure. Dry weight is between 202 -205. He has had to take an extra half a Lasix on a couple of occasions since discharge. He denies abdominal swelling, edema, orthopnea or PND.  He denies any claudication but his mobility is limited.  Past Medical History  Diagnosis Date  . Sinus bradycardia   . Tobacco abuse   . Chronic renal insufficiency   . Hypertension   . Osteoarthritis   . Diabetes mellitus   . Anemia     Multifactorial  - hx of B12 def and iron deficiency, followed by heme  . Mitral regurgitation   . Systolic CHF, chronic     Previously taken off lisinopril by primary doctor due to labs  . Carotid stenosis     a. Prior carotid dz/ surgery. b. Carotid dopp 07/2011 - 0-39% bilaterally.;   c. Doppler 11/13: 40-59% RICA, 0-39% LICA  . Cardiomyopathy, ischemic     a. Chronic systolic CHF s/p St. Jude dual chamber ICD 03/2008.  [name] Hx of CABG   . CAD (coronary artery disease)     a. STEMI s/p CABG 07/2007 (had preop cardiogenic shock, IABP, VDRF before surgery)  . Stroke     a. CVA 1990s - chronic pain in RUE after stroke.  [name] PAF (paroxysmal atrial fibrillation)     a. Poor Coumadin candidate due to hx of GI bleed.  . GI bleed     Reported history  . Splenomegaly     Current Outpatient Prescriptions  Medication Sig Dispense  Refill  . aspirin EC 81 MG EC tablet Take 1 tablet (81 mg total) by mouth daily.      [name] atorvastatin (LIPITOR) 80 MG tablet Take 40 mg by mouth every other day.       . baclofen (LIORESAL) 20 MG tablet Take 20 mg by mouth 2 (two) times daily.      . carvedilol (COREG) 12.5 MG tablet TAKE ONE TABLET BY MOUTH TWICE DAILY WITH MEALS  60 tablet  5  . clopidogrel (PLAVIX) 75 MG tablet Take 1 tablet (75 mg total) by mouth daily.  30 tablet  3  . furosemide (LASIX) 40 MG tablet Take 20 mg by mouth daily.       [name] glimepiride (AMARYL) 4 MG tablet Take 4 mg by mouth 2 (two) times daily.       . isosorbide mononitrate (IMDUR) 30 MG 24 hr tablet Take 1 tablet (30 mg total) by mouth daily.  30 tablet  11  . linagliptin (TRADJENTA) 5 MG TABS tablet Take 5 mg by mouth daily.      . meclizine (ANTIVERT) 25 MG tablet Take 25 mg by mouth 4 (four) times daily as needed. For dizziness      . nitroGLYCERIN (NITROSTAT) 0.4 MG SL tablet Place 0.4 mg under the tongue every 5 (five) minutes as needed. For chest pain      .  spironolactone (ALDACTONE) 25 MG tablet TAKE ONE TABLET BY MOUTH EVERY DAY IN THE MORNING  30 tablet  12  . traMADol (ULTRAM) 50 MG tablet Take 50 mg by mouth every 8 (eight) hours as needed. For pain        Allergies  Allergen Reactions  . Codeine     REACTION: G I  distress    Family History  Problem Relation Age of Onset  . Heart attack Mother     CVA, MI  . Diabetes Mother   . Heart attack Father     MI  . Diabetes Sister   . Coronary artery disease Brother     CABG    History   Social History  . Marital Status: Married    Spouse Name: N/A    Number of Children: N/A  . Years of Education: N/A   Occupational History  . Retired    Social History Main Topics  . Smoking status: Former Smoker -- 1.0 packs/day for 15 years    Quit date: 09/29/1990  . Smokeless tobacco: Never Used  . Alcohol Use: No  . Drug Use: No  . Sexually Active: Not on file   Other Topics Concern  .  Not on file   Social History Narrative   [place] wifeBeen on disability since his stroke in the 1990sNot routinely exercising    ROS ALL NEGATIVE EXCEPT THOSE NOTED IN HPI  PE  General Appearance: well developed, well nourished in no acute distress, looks older than stated age. HEENT: symmetrical face, PERRLA, good dentition  Neck: no JVD, thyromegaly, or adenopathy, trachea midline Chest: symmetric without deformity Cardiac: PMI non-displaced, RRR, normal S1, S2, no gallop or murmur Lung: clear to ausculation and percussion Vascular: Bilateral carotid bruits right greater than left, decreased pulses in the lower extremities, decreased capillary refill, dependent rubor  Abdominal: nondistended, nontender, good bowel sounds, no HSM, no bruits Extremities: no cyanosis, clubbing or edema, no sign of DVT, no varicosities  Skin: normal color, no rashes Neuro: alert and oriented x 3, non-focal, residual deficits from his previous stroke. Pysch: normal affect  EKG  BMET    Component Value Date/Time   NA 139 08/19/2012 1037   NA 141 07/07/2012 0840   K 3.7 08/19/2012 1037   K 3.9 07/07/2012 0840   CL 101 08/19/2012 1037   CL 104 07/07/2012 0840   CO2 30 08/19/2012 1037   CO2 25 07/07/2012 0840   GLUCOSE 156* 08/19/2012 1037   GLUCOSE 117* 07/07/2012 0840   BUN 24* 08/19/2012 1037   BUN 22.0 07/07/2012 0840   CREATININE 1.2 08/19/2012 1037   CREATININE 1.1 07/07/2012 0840   CALCIUM 9.0 08/19/2012 1037   CALCIUM 9.1 07/07/2012 0840   GFRNONAA 69* 07/29/2012 0256   GFRAA 79* 07/29/2012 0256    Lipid Panel     Component Value Date/Time   CHOL  Value: 78        ATP III CLASSIFICATION:  <200     mg/dL   Desirable  409-811  mg/dL   Borderline High  >=914    mg/dL   High        04/06/2955 0414   TRIG 50 02/27/2010 0414   HDL 30* 02/27/2010 0414   CHOLHDL 2.6 02/27/2010 0414   VLDL 10 02/27/2010 0414   LDLCALC  Value: 38        Total Cholesterol/HDL:CHD Risk Coronary Heart  Disease Risk Table  Men   Women  1/2 Average Risk   3.4   3.3  Average Risk       5.0   4.4  2 X Average Risk   9.6   7.1  3 X Average Risk  23.4   11.0        Use the calculated Patient Ratio above and the CHD Risk Table to determine the patient's CHD Risk.        ATP III CLASSIFICATION (LDL):  <100     mg/dL   Optimal  409-811  mg/dL   Near or Above                    Optimal  130-159  mg/dL   Borderline  914-782  mg/dL   High  >956     mg/dL   Very High 10/30/3084 5784    CBC    Component Value Date/Time   WBC 5.6 07/29/2012 0256   WBC 8.6 07/07/2012 0840   RBC 4.86 07/29/2012 0256   RBC 5.06 07/07/2012 0840   HGB 14.3 07/29/2012 0256   HGB 14.9 07/07/2012 0840   HCT 41.7 07/29/2012 0256   HCT 44.7 07/07/2012 0840   PLT 165 07/29/2012 0256   PLT 203 07/07/2012 0840   MCV 85.8 07/29/2012 0256   MCV 88.3 07/07/2012 0840   MCH 29.4 07/29/2012 0256   MCH 29.4 07/07/2012 0840   MCHC 34.3 07/29/2012 0256   MCHC 33.3 07/07/2012 0840   RDW 13.8 07/29/2012 0256   RDW 14.1 07/07/2012 0840   LYMPHSABS 1.2 07/28/2012 1623   LYMPHSABS 2.4 07/07/2012 0840   MONOABS 0.8 07/28/2012 1623   MONOABS 1.1* 07/07/2012 0840   EOSABS 0.1 07/28/2012 1623   EOSABS 0.1 07/07/2012 0840   BASOSABS 0.0 07/28/2012 1623   BASOSABS 0.0 07/07/2012 0840

## 2012-09-09 NOTE — Assessment & Plan Note (Signed)
Stable. Continue secondary preventative therapy. Abdominal ultrasound recently showed no abdominal aortic aneurysm.

## 2012-09-09 NOTE — Patient Instructions (Addendum)
Your physician recommends that you continue on your current medications as directed. Please refer to the Current Medication list given to you today.  Your physician wants you to follow-up in: 6 months with Dr. Wall. You will receive a reminder letter in the mail two months in advance. If you don't receive a letter, please call our office to schedule the follow-up appointment.  

## 2012-09-30 ENCOUNTER — Ambulatory Visit: Payer: Medicare Other

## 2012-10-06 ENCOUNTER — Ambulatory Visit (HOSPITAL_BASED_OUTPATIENT_CLINIC_OR_DEPARTMENT_OTHER): Payer: BC Managed Care – HMO

## 2012-10-06 ENCOUNTER — Other Ambulatory Visit (HOSPITAL_BASED_OUTPATIENT_CLINIC_OR_DEPARTMENT_OTHER): Payer: Medicare Other | Admitting: Lab

## 2012-10-06 VITALS — BP 123/73 | HR 86 | Temp 96.8°F

## 2012-10-06 DIAGNOSIS — D649 Anemia, unspecified: Secondary | ICD-10-CM

## 2012-10-06 DIAGNOSIS — E538 Deficiency of other specified B group vitamins: Secondary | ICD-10-CM

## 2012-10-06 LAB — CBC WITH DIFFERENTIAL/PLATELET
BASO%: 0.5 % (ref 0.0–2.0)
EOS%: 3.2 % (ref 0.0–7.0)
MCH: 30.7 pg (ref 27.2–33.4)
MCHC: 34.1 g/dL (ref 32.0–36.0)
RBC: 4.79 10*6/uL (ref 4.20–5.82)
RDW: 15.1 % — ABNORMAL HIGH (ref 11.0–14.6)
lymph#: 1.7 10*3/uL (ref 0.9–3.3)

## 2012-10-06 LAB — COMPREHENSIVE METABOLIC PANEL (CC13)
ALT: 11 U/L (ref 0–55)
AST: 15 U/L (ref 5–34)
Albumin: 3.8 g/dL (ref 3.5–5.0)
Calcium: 9.6 mg/dL (ref 8.4–10.4)
Chloride: 101 mEq/L (ref 98–107)
Creatinine: 1.2 mg/dL (ref 0.7–1.3)
Potassium: 3.8 mEq/L (ref 3.5–5.1)
Sodium: 140 mEq/L (ref 136–145)

## 2012-10-06 LAB — IRON AND TIBC: TIBC: 342 ug/dL (ref 215–435)

## 2012-10-06 MED ORDER — CYANOCOBALAMIN 1000 MCG/ML IJ SOLN
1000.0000 ug | Freq: Once | INTRAMUSCULAR | Status: AC
  Administered 2012-10-06: 1000 ug via INTRAMUSCULAR

## 2012-10-18 ENCOUNTER — Telehealth: Payer: Self-pay

## 2012-10-18 NOTE — Telephone Encounter (Signed)
Pt called asking about labs on 1/8. Pt was told that his glucose was 154 and this is a high number, he did not remember eating before this result. Pt stated Dr Charm Barges was managing his glucose. Pt does home glucose monitoring BID.

## 2012-10-22 ENCOUNTER — Encounter: Payer: Self-pay | Admitting: *Deleted

## 2012-10-26 ENCOUNTER — Other Ambulatory Visit: Payer: Self-pay | Admitting: *Deleted

## 2012-10-26 ENCOUNTER — Telehealth: Payer: Self-pay | Admitting: Cardiology

## 2012-10-26 MED ORDER — CLOPIDOGREL BISULFATE 75 MG PO TABS: 75.0000 mg | ORAL_TABLET | Freq: Every day | ORAL | Status: DC

## 2012-10-26 NOTE — Telephone Encounter (Signed)
New Problem: ° ° ° °Patient called in needing a refill of his clopidogrel (PLAVIX) 75 MG tablet. °

## 2012-10-28 ENCOUNTER — Ambulatory Visit: Payer: Medicare Other

## 2012-11-03 ENCOUNTER — Encounter: Payer: Self-pay | Admitting: Internal Medicine

## 2012-11-03 ENCOUNTER — Ambulatory Visit (INDEPENDENT_AMBULATORY_CARE_PROVIDER_SITE_OTHER): Payer: BC Managed Care – HMO | Admitting: *Deleted

## 2012-11-03 ENCOUNTER — Ambulatory Visit (HOSPITAL_BASED_OUTPATIENT_CLINIC_OR_DEPARTMENT_OTHER): Payer: BC Managed Care – HMO

## 2012-11-03 VITALS — BP 123/72 | HR 83 | Temp 97.1°F

## 2012-11-03 DIAGNOSIS — D649 Anemia, unspecified: Secondary | ICD-10-CM

## 2012-11-03 DIAGNOSIS — I509 Heart failure, unspecified: Secondary | ICD-10-CM

## 2012-11-03 DIAGNOSIS — I2589 Other forms of chronic ischemic heart disease: Secondary | ICD-10-CM

## 2012-11-03 DIAGNOSIS — E538 Deficiency of other specified B group vitamins: Secondary | ICD-10-CM

## 2012-11-03 LAB — ICD DEVICE OBSERVATION
AL AMPLITUDE: 2.8 mv
AL THRESHOLD: 0.5 V
ATRIAL PACING ICD: 83 pct
DEVICE MODEL ICD: 541189
FVT: 0
HV IMPEDENCE: 70 Ohm
PACEART VT: 0
RV LEAD AMPLITUDE: 11.7 mv
TOT-0009: 1
TOT-0010: 27

## 2012-11-03 MED ORDER — CYANOCOBALAMIN 1000 MCG/ML IJ SOLN
1000.0000 ug | Freq: Once | INTRAMUSCULAR | Status: AC
  Administered 2012-11-03: 1000 ug via INTRAMUSCULAR

## 2012-11-03 NOTE — Progress Notes (Signed)
ICD check 

## 2012-11-08 ENCOUNTER — Ambulatory Visit: Payer: Medicare Other

## 2012-12-01 ENCOUNTER — Ambulatory Visit (HOSPITAL_BASED_OUTPATIENT_CLINIC_OR_DEPARTMENT_OTHER): Payer: Medicare Other

## 2012-12-01 VITALS — BP 142/70 | HR 79 | Temp 97.2°F

## 2012-12-01 DIAGNOSIS — E538 Deficiency of other specified B group vitamins: Secondary | ICD-10-CM

## 2012-12-01 DIAGNOSIS — D649 Anemia, unspecified: Secondary | ICD-10-CM

## 2012-12-01 MED ORDER — CYANOCOBALAMIN 1000 MCG/ML IJ SOLN
1000.0000 ug | Freq: Once | INTRAMUSCULAR | Status: AC
  Administered 2012-12-01: 1000 ug via INTRAMUSCULAR

## 2012-12-06 ENCOUNTER — Ambulatory Visit: Payer: Medicare Other

## 2013-01-06 ENCOUNTER — Telehealth: Payer: Self-pay | Admitting: Oncology

## 2013-01-06 ENCOUNTER — Encounter: Payer: Self-pay | Admitting: Oncology

## 2013-01-06 ENCOUNTER — Ambulatory Visit (HOSPITAL_BASED_OUTPATIENT_CLINIC_OR_DEPARTMENT_OTHER): Payer: Medicare Other | Admitting: Oncology

## 2013-01-06 ENCOUNTER — Ambulatory Visit (HOSPITAL_BASED_OUTPATIENT_CLINIC_OR_DEPARTMENT_OTHER): Payer: Medicare Other

## 2013-01-06 ENCOUNTER — Other Ambulatory Visit (HOSPITAL_BASED_OUTPATIENT_CLINIC_OR_DEPARTMENT_OTHER): Payer: Medicare Other | Admitting: Lab

## 2013-01-06 VITALS — BP 126/77 | HR 72 | Temp 97.1°F | Resp 18 | Ht 74.0 in | Wt 203.8 lb

## 2013-01-06 DIAGNOSIS — D509 Iron deficiency anemia, unspecified: Secondary | ICD-10-CM

## 2013-01-06 DIAGNOSIS — E538 Deficiency of other specified B group vitamins: Secondary | ICD-10-CM

## 2013-01-06 DIAGNOSIS — D649 Anemia, unspecified: Secondary | ICD-10-CM

## 2013-01-06 LAB — COMPREHENSIVE METABOLIC PANEL (CC13)
ALT: 10 U/L (ref 0–55)
AST: 15 U/L (ref 5–34)
BUN: 23.1 mg/dL (ref 7.0–26.0)
Creatinine: 1.3 mg/dL (ref 0.7–1.3)
Total Bilirubin: 0.65 mg/dL (ref 0.20–1.20)

## 2013-01-06 LAB — CBC WITH DIFFERENTIAL/PLATELET
BASO%: 0.2 % (ref 0.0–2.0)
EOS%: 1.9 % (ref 0.0–7.0)
HCT: 43.8 % (ref 38.4–49.9)
LYMPH%: 21 % (ref 14.0–49.0)
MCH: 29.8 pg (ref 27.2–33.4)
MCHC: 33.6 g/dL (ref 32.0–36.0)
MCV: 88.7 fL (ref 79.3–98.0)
NEUT%: 67.4 % (ref 39.0–75.0)
Platelets: 211 10*3/uL (ref 140–400)

## 2013-01-06 LAB — IRON AND TIBC
%SAT: 22 % (ref 20–55)
TIBC: 339 ug/dL (ref 215–435)
UIBC: 266 ug/dL (ref 125–400)

## 2013-01-06 LAB — VITAMIN B12: Vitamin B-12: 414 pg/mL (ref 211–911)

## 2013-01-06 LAB — LACTATE DEHYDROGENASE (CC13): LDH: 188 U/L (ref 125–245)

## 2013-01-06 MED ORDER — CYANOCOBALAMIN 1000 MCG/ML IJ SOLN
1000.0000 ug | Freq: Once | INTRAMUSCULAR | Status: AC
  Administered 2013-01-06: 1000 ug via INTRAMUSCULAR

## 2013-01-06 NOTE — Telephone Encounter (Signed)
gv and printed pt appt [place] for May thru Dec...pt ok and aware

## 2013-01-06 NOTE — Progress Notes (Signed)
This office note has been dictated.  #[id number]

## 2013-01-06 NOTE — Patient Instructions (Signed)
Continue monthly Vitamin B12 shots.  See you in 6 months.

## 2013-01-07 NOTE — Progress Notes (Signed)
CC:   [name] Jester, MD Jesse Sans. Wall, MD, FACC R. Roetta Sessions, MD FACP Arlina Robes H. Cornelius Moras, M.D.  PROBLEM LIST: 1. History of iron-deficiency anemia. 2. History of vitamin B12 deficiency, currently on monthly vitamin B12     shots after patient expressed intolerance to oral vitamin B12. 3. Renal insufficiency. 4. Severe 3-vessel coronary artery disease status post coronary artery     bypass graft on 08/29/2007.  History of myocardial infarction     08/11/2007.  Right dual chamber implantable cardioverter     defibrillator placed 04/05/2008. 5. Ischemic cardiomyopathy. 6. Hypertension. 7. Dyslipidemia. 8. Diabetes mellitus type 2. 9. 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  The patient is status post left carotid     endarterectomy. 10. Left total knee replacement at age 21. 11. History of splenomegaly seen on a CT scan from 08/14/2007 and     08/25/2007.  MEDICATIONS:  Reviewed and recorded. Current Outpatient Prescriptions  Medication Sig Dispense Refill  . aspirin EC 81 MG EC tablet Take 1 tablet (81 mg total) by mouth daily.      [name] atorvastatin (LIPITOR) 80 MG tablet Take 40 mg by mouth every other day.       . baclofen (LIORESAL) 20 MG tablet Take 20 mg by mouth 2 (two) times daily.      . carvedilol (COREG) 12.5 MG tablet TAKE ONE TABLET BY MOUTH TWICE DAILY WITH MEALS  60 tablet  5  . clopidogrel (PLAVIX) 75 MG tablet Take 1 tablet (75 mg total) by mouth daily.  30 tablet  5  . furosemide (LASIX) 40 MG tablet Take 20 mg by mouth daily.       [name] glimepiride (AMARYL) 4 MG tablet Take 4 mg by mouth 2 (two) times daily.       [name] linagliptin (TRADJENTA) 5 MG TABS tablet Take 5 mg by mouth daily.      . meclizine (ANTIVERT) 25 MG tablet Take 25 mg by mouth 4 (four) times daily as needed. For dizziness      . nitroGLYCERIN (NITROSTAT) 0.4 MG SL tablet Place 0.4 mg under the tongue every 5 (five) minutes as needed. For chest pain       . spironolactone (ALDACTONE) 25 MG tablet TAKE ONE TABLET BY MOUTH EVERY DAY IN THE MORNING  30 tablet  12  . traMADol (ULTRAM) 50 MG tablet Take 50 mg by mouth every 8 (eight) hours as needed. For pain       No current facility-administered medications for this visit.    TREATMENT PROGRAM:   Vitamin B12 1000 mcg IM monthly.  These injections were restarted on 08/05/2012.   SMOKING HISTORY:  The patient smoked 1/2 pack cigarettes a day for 10 years.  He stopped smoking at age 36.   HISTORY:  [name] is a 67 year old gentleman who lives in [place].  We follow [name] for history of anemia, which in the past was secondary to iron deficiency, vitamin B12 deficiency and renal insufficiency.  The patient was first seen by [place] as a hospital consult on August 18, 2007.  The patient has done extremely well.  We see him about every 6 months.  He was last seen by [place] on 07/07/2012.  At that time he was supposed to be on oral vitamin B12 and iron.  He apparently stopped taking these pills because it was making him feel nauseated.  We started giving him vitamin  B12 shots in early November 2013.  The patient has been quite compliant with this treatment.  He is currently not on any iron pills.  He denies any obvious blood in his stools or any melena.  The patient's health has been quite good.  He has major heart disease with severe coronary artery disease and an ischemic cardiomyopathy.  He is followed closely by Dr. Juanito Doom.  The patient apparently came to the hospital with chest pain on August 28, 2012.  There were some atypical features.  The patient underwent a stress test and apparently did not have any evidence of ischemia even though he does have severe heart disease.  He has had no further episodes of chest pain or defibrillator discharges.  The patient's major problem is pain involving his right knee. Apparently he needs to have a knee replacement, but his cardiac  disease prohibits surgery.  The patient has had a total knee replacement on the left approximately 15 years ago.  PHYSICAL EXAMINATION:  General:  [name] looks fairly well.  Vital signs:  Weight is stable at 203 pounds 12.8 ounces.  Height 62 inches. Body surface area 2.2 m2.  Blood pressure 126/77.  Other vital signs are normal.  Weight has been fairly stable.  HEENT:  There is no scleral icterus.  Dentition is poor with many missing teeth.  There is no peripheral adenopathy palpable.  Heart and Lungs:  Normal.  He has an implanted ICD in the right infraclavicular area.  The patient has had cardiac surgery as well as a carotid endarterectomy in the left neck. Abdomen:  Soft and nontender with no organomegaly or masses palpable.  I could not appreciate any enlargement of the spleen.  The patient has umbilical hernia and probably a diastasis recti.  Extremities:  No peripheral edema or clubbing.  The patient occasionally has some swelling of the legs.  The patient may have some very mild weakness of his right arm but actually has fairly good strength in his right arm. This seems to be improving.  LABORATORY DATA:  White count 8.1, ANC 5.4, hemoglobin 14.7, hematocrit 43.8, platelets 211,000.  Chemistries today are notable for a glucose of 144, otherwise normal.  BUN 23, creatinine 1.3.  Albumin 3.9 and LDH 188.  Vitamin B12 level and iron studies today are pending.  Vitamin B12 level on 10/06/2012 was 362, and on 07/07/2012 was 272.  Ferritin on 10/06/2012 was 60 with an iron saturation of 23%.  On 07/07/2012 ferritin was 33 with an iron saturation of 12%.  IMAGING STUDIES: 1. Chest x-ray 2-view on 07/28/2012 showed stable chronic and     postoperative findings with no superimposed CHF or pneumonia. 2. Ultrasound of the abdominal aorta on 07/29/2012 showed no aneurysm     identified. 3. Nuclear Medicine myocardial evaluation showed abnormal Lexiscan     Myoview with chest pain  but no diagnostic electrocardiographic     changes.  The scintigraphic results show extensive prior distal     anteroseptal, apical, and inferior infarcts.  No ischemia noted.     The gated ejection fraction was 37%, and there was apical and     inferior akinesis and septal hypokinesis.  There was evidence of     left ventricular enlargement.  IMPRESSION AND PLAN:  From a hematologic standpoint [name] is doing extremely well.  He comes here for monthly vitamin B12 shots and will receive vitamin B12 1000 mcg IM today.  For now, he is not  on any iron therapy and his iron studies seem to be holding.  We will plan to see [name] again in 6 months at which time we will check CBC, chemistries, vitamin B12 level and iron studies.    ______________________________ Samul Dada, M.D. DSM/MEDQ  D:  01/06/2013  T:  01/07/2013  Job:  [id number]

## 2013-01-27 ENCOUNTER — Other Ambulatory Visit: Payer: Self-pay | Admitting: Cardiology

## 2013-02-03 ENCOUNTER — Ambulatory Visit (HOSPITAL_BASED_OUTPATIENT_CLINIC_OR_DEPARTMENT_OTHER): Payer: Medicare Other

## 2013-02-03 VITALS — BP 128/62 | HR 80 | Temp 97.6°F

## 2013-02-03 DIAGNOSIS — D649 Anemia, unspecified: Secondary | ICD-10-CM

## 2013-02-03 MED ORDER — CYANOCOBALAMIN 1000 MCG/ML IJ SOLN
1000.0000 ug | Freq: Once | INTRAMUSCULAR | Status: AC
  Administered 2013-02-03: 1000 ug via INTRAMUSCULAR

## 2013-02-08 ENCOUNTER — Encounter: Payer: Self-pay | Admitting: Internal Medicine

## 2013-02-08 ENCOUNTER — Ambulatory Visit (INDEPENDENT_AMBULATORY_CARE_PROVIDER_SITE_OTHER): Payer: Medicare Other | Admitting: Internal Medicine

## 2013-02-08 ENCOUNTER — Telehealth: Payer: Self-pay | Admitting: Internal Medicine

## 2013-02-08 VITALS — BP 110/70 | HR 52 | Ht 73.0 in | Wt 203.4 lb

## 2013-02-08 DIAGNOSIS — I2589 Other forms of chronic ischemic heart disease: Secondary | ICD-10-CM

## 2013-02-08 DIAGNOSIS — M1990 Unspecified osteoarthritis, unspecified site: Secondary | ICD-10-CM

## 2013-02-08 DIAGNOSIS — Z95810 Presence of automatic (implantable) cardiac defibrillator: Secondary | ICD-10-CM

## 2013-02-08 DIAGNOSIS — I509 Heart failure, unspecified: Secondary | ICD-10-CM

## 2013-02-08 DIAGNOSIS — I2510 Atherosclerotic heart disease of native coronary artery without angina pectoris: Secondary | ICD-10-CM

## 2013-02-08 LAB — ICD DEVICE OBSERVATION
AL IMPEDENCE ICD: 360 Ohm
ATRIAL PACING ICD: 79 pct
BAMS-0003: 70 {beats}/min
BATTERY VOLTAGE: 2.59 V
DEV-0020ICD: NEGATIVE
DEVICE MODEL ICD: 541189
RV LEAD AMPLITUDE: 11.7 mv
VENTRICULAR PACING ICD: 1 pct

## 2013-02-08 NOTE — Assessment & Plan Note (Signed)
His chronic systolic heart failure is compensated. He is sedentary however. He will continue his current medical therapy.

## 2013-02-08 NOTE — Assessment & Plan Note (Signed)
He currently has severe arthritis, particularly in his right knee. He is very sedentary. Previously, he was thought to be too high risk for knee replacement. We'll follow.

## 2013-02-08 NOTE — Telephone Encounter (Signed)
Dr Berton Lan or Dr Charlann Boxer [phone].  Patient aware of name and number

## 2013-02-08 NOTE — Telephone Encounter (Signed)
New problem    Seen in the office today   Was told or  referral to a physician regarding his legs. Patient did not get that name .

## 2013-02-08 NOTE — Progress Notes (Signed)
HPI [name] returns today for followup. He is a very pleasant 67 year old man with an ischemic cardiomyopathy, chronic systolic heart failure, status post ICD implantation. In the interim, he has been stable. He denies chest pain or shortness of breath. He admits to being very sedentary however because of severe arthritis, particularly in his right knee. He denies syncope or near-syncope. No chest pain. Allergies  Allergen Reactions  . Codeine     REACTION: G I  distress     Current Outpatient Prescriptions  Medication Sig Dispense Refill  . aspirin EC 81 MG EC tablet Take 1 tablet (81 mg total) by mouth daily.      [name] atorvastatin (LIPITOR) 80 MG tablet Take 40 mg by mouth every other day.       . baclofen (LIORESAL) 20 MG tablet Take 20 mg by mouth 2 (two) times daily.      . clopidogrel (PLAVIX) 75 MG tablet Take 1 tablet (75 mg total) by mouth daily.  30 tablet  5  . furosemide (LASIX) 40 MG tablet Take 20 mg by mouth daily.       [name] glimepiride (AMARYL) 4 MG tablet Take 4 mg by mouth 2 (two) times daily.       [name] linagliptin (TRADJENTA) 5 MG TABS tablet Take 5 mg by mouth daily.      . meclizine (ANTIVERT) 25 MG tablet Take 25 mg by mouth 4 (four) times daily as needed. For dizziness      . nitroGLYCERIN (NITROSTAT) 0.4 MG SL tablet Place 0.4 mg under the tongue every 5 (five) minutes as needed. For chest pain      . spironolactone (ALDACTONE) 25 MG tablet TAKE ONE TABLET BY MOUTH EVERY DAY IN THE MORNING  30 tablet  12  . traMADol (ULTRAM) 50 MG tablet Take 50 mg by mouth every 8 (eight) hours as needed. For pain      . carvedilol (COREG) 12.5 MG tablet TAKE ONE TABLET BY MOUTH TWICE DAILY WITH MEALS  60 tablet  5   No current facility-administered medications for this visit.     Past Medical History  Diagnosis Date  . Sinus bradycardia   . Tobacco abuse   . Chronic renal insufficiency   . Hypertension   . Osteoarthritis   . Diabetes mellitus   . Anemia     Multifactorial   - hx of B12 def and iron deficiency, followed by heme  . Mitral regurgitation   . Systolic CHF, chronic     Previously taken off lisinopril by primary doctor due to labs  . Carotid stenosis     a. Prior carotid dz/ surgery. b. Carotid dopp 07/2011 - 0-39% bilaterally.;   c. Doppler 11/13: 40-59% RICA, 0-39% LICA  . Cardiomyopathy, ischemic     a. Chronic systolic CHF s/p St. Jude dual chamber ICD 03/2008.  [name] Hx of CABG   . CAD (coronary artery disease)     a. STEMI s/p CABG 07/2007 (had preop cardiogenic shock, IABP, VDRF before surgery)  . Stroke     a. CVA 1990s - chronic pain in RUE after stroke.  [name] PAF (paroxysmal atrial fibrillation)     a. Poor Coumadin candidate due to hx of GI bleed.  . GI bleed     Reported history  . Splenomegaly     ROS:   All systems reviewed and negative except as noted in the HPI.   Past Surgical History  Procedure Laterality Date  . Coronary  artery bypass graft    . Percutaneous coronary intervention  01/10/2005    Charlies Constable, MD  . Coronary artery bypass graft  08/29/2007    x3; Salvatore Decent. Cornelius Moras MD  . Cardiac defibrillator placement  04/05/2008    Implantation of a St. Jude dual-chamber defibrillator, Doylene Canning. Ladona Ridgel , MD  . Cardiac defibrillator placement       Family History  Problem Relation Age of Onset  . Heart attack Mother     CVA, MI  . Diabetes Mother   . Heart attack Father     MI  . Diabetes Sister   . Coronary artery disease Brother     CABG     History   Social History  . Marital Status: Married    Spouse Name: N/A    Number of Children: N/A  . Years of Education: N/A   Occupational History  . Retired    Social History Main Topics  . Smoking status: Former Smoker -- 1.00 packs/day for 15 years    Quit date: 09/29/1990  . Smokeless tobacco: Never Used  . Alcohol Use: No  . Drug Use: No  . Sexually Active: Not on file   Other Topics Concern  . Not on file   Social History Narrative   Lives in  [place] with wife   Been on disability since his stroke in the 1990s   Not routinely exercising     BP 110/70  Pulse 52  Ht 6\' 1"  (1.854 m)  Wt 203 lb 6.4 oz (92.262 kg)  BMI 26.84 kg/m2  Physical Exam:  stable appearing 67 year old man, NAD HEENT: Unremarkable Neck:  7 cm JVD, no thyromegally Back:  No CVA tenderness Lungs:  Clear with no wheezes, rales, or rhonchi. HEART:  Regular rate rhythm, no murmurs, no rubs, no clicks Abd:  soft, positive bowel sounds, no organomegally, no rebound, no guarding Ext:  2 plus pulses, no edema, no cyanosis, no clubbing, right knee with chronic arthritic changes Skin:  No rashes no nodules Neuro:  CN II through XII intact, motor grossly intact  DEVICE  Normal device function.  See PaceArt for details.   Assess/Plan:

## 2013-02-08 NOTE — Patient Instructions (Addendum)
Your physician recommends that you schedule a follow-up appointment in: 3 months with device clinic     Your physician wants you to follow-up in: 12 months with Dr Malva Cogan Bonita Quin will receive a reminder letter in the mail two months in advance. If you don't receive a letter, please call our office to schedule the follow-up appointment.

## 2013-02-08 NOTE — Assessment & Plan Note (Signed)
He denies anginal symptoms. He is inactive. He will continue his current medical therapy.

## 2013-02-08 NOTE — Assessment & Plan Note (Signed)
His St. Jude defibrillator is working normally. We'll plan to recheck in several months.

## 2013-02-13 ENCOUNTER — Other Ambulatory Visit: Payer: Self-pay | Admitting: Cardiology

## 2013-03-03 ENCOUNTER — Ambulatory Visit (HOSPITAL_BASED_OUTPATIENT_CLINIC_OR_DEPARTMENT_OTHER): Payer: Medicare Other

## 2013-03-03 VITALS — BP 132/64 | HR 79 | Temp 97.9°F

## 2013-03-03 DIAGNOSIS — E538 Deficiency of other specified B group vitamins: Secondary | ICD-10-CM

## 2013-03-03 DIAGNOSIS — D649 Anemia, unspecified: Secondary | ICD-10-CM

## 2013-03-03 MED ORDER — CYANOCOBALAMIN 1000 MCG/ML IJ SOLN
1000.0000 ug | Freq: Once | INTRAMUSCULAR | Status: AC
  Administered 2013-03-03: 1000 ug via INTRAMUSCULAR

## 2013-03-05 ENCOUNTER — Encounter (HOSPITAL_COMMUNITY): Payer: Self-pay | Admitting: Emergency Medicine

## 2013-03-05 ENCOUNTER — Inpatient Hospital Stay (HOSPITAL_COMMUNITY)
Admission: EM | Admit: 2013-03-05 | Discharge: 2013-03-08 | DRG: 445 | Disposition: A | Discharge status: Home / Self Care | Payer: Medicare Other | Attending: General Surgery | Admitting: General Surgery

## 2013-03-05 ENCOUNTER — Emergency Department (HOSPITAL_COMMUNITY): Payer: Medicare Other

## 2013-03-05 ENCOUNTER — Other Ambulatory Visit: Payer: Self-pay

## 2013-03-05 DIAGNOSIS — I10 Essential (primary) hypertension: Secondary | ICD-10-CM | POA: Diagnosis present

## 2013-03-05 DIAGNOSIS — I2589 Other forms of chronic ischemic heart disease: Secondary | ICD-10-CM | POA: Diagnosis present

## 2013-03-05 DIAGNOSIS — Z95810 Presence of automatic (implantable) cardiac defibrillator: Secondary | ICD-10-CM | POA: Diagnosis present

## 2013-03-05 DIAGNOSIS — I5022 Chronic systolic (congestive) heart failure: Secondary | ICD-10-CM | POA: Diagnosis present

## 2013-03-05 DIAGNOSIS — K8000 Calculus of gallbladder with acute cholecystitis without obstruction: Principal | ICD-10-CM | POA: Diagnosis present

## 2013-03-05 DIAGNOSIS — I509 Heart failure, unspecified: Secondary | ICD-10-CM | POA: Diagnosis present

## 2013-03-05 DIAGNOSIS — Z7902 Long term (current) use of antithrombotics/antiplatelets: Secondary | ICD-10-CM

## 2013-03-05 DIAGNOSIS — Z833 Family history of diabetes mellitus: Secondary | ICD-10-CM

## 2013-03-05 DIAGNOSIS — N189 Chronic kidney disease, unspecified: Secondary | ICD-10-CM | POA: Diagnosis present

## 2013-03-05 DIAGNOSIS — M1990 Unspecified osteoarthritis, unspecified site: Secondary | ICD-10-CM | POA: Diagnosis present

## 2013-03-05 DIAGNOSIS — Z951 Presence of aortocoronary bypass graft: Secondary | ICD-10-CM

## 2013-03-05 DIAGNOSIS — E119 Type 2 diabetes mellitus without complications: Secondary | ICD-10-CM | POA: Diagnosis present

## 2013-03-05 DIAGNOSIS — Z8673 Personal history of transient ischemic attack (TIA), and cerebral infarction without residual deficits: Secondary | ICD-10-CM

## 2013-03-05 DIAGNOSIS — I2510 Atherosclerotic heart disease of native coronary artery without angina pectoris: Secondary | ICD-10-CM

## 2013-03-05 DIAGNOSIS — K819 Cholecystitis, unspecified: Secondary | ICD-10-CM

## 2013-03-05 DIAGNOSIS — Z23 Encounter for immunization: Secondary | ICD-10-CM

## 2013-03-05 DIAGNOSIS — I129 Hypertensive chronic kidney disease with stage 1 through stage 4 chronic kidney disease, or unspecified chronic kidney disease: Secondary | ICD-10-CM | POA: Diagnosis present

## 2013-03-05 DIAGNOSIS — K8010 Calculus of gallbladder with chronic cholecystitis without obstruction: Secondary | ICD-10-CM | POA: Diagnosis present

## 2013-03-05 DIAGNOSIS — I059 Rheumatic mitral valve disease, unspecified: Secondary | ICD-10-CM | POA: Diagnosis present

## 2013-03-05 DIAGNOSIS — Z79899 Other long term (current) drug therapy: Secondary | ICD-10-CM

## 2013-03-05 DIAGNOSIS — I428 Other cardiomyopathies: Secondary | ICD-10-CM | POA: Diagnosis present

## 2013-03-05 DIAGNOSIS — I779 Disorder of arteries and arterioles, unspecified: Secondary | ICD-10-CM | POA: Diagnosis present

## 2013-03-05 DIAGNOSIS — Z87891 Personal history of nicotine dependence: Secondary | ICD-10-CM

## 2013-03-05 DIAGNOSIS — Z8249 Family history of ischemic heart disease and other diseases of the circulatory system: Secondary | ICD-10-CM

## 2013-03-05 LAB — BASIC METABOLIC PANEL
CO2: 26 mEq/L (ref 19–32)
Calcium: 9.3 mg/dL (ref 8.4–10.5)
Chloride: 98 mEq/L (ref 96–112)
Creatinine, Ser: 1.02 mg/dL (ref 0.50–1.35)
Glucose, Bld: 246 mg/dL — ABNORMAL HIGH (ref 70–99)
Sodium: 136 mEq/L (ref 135–145)

## 2013-03-05 LAB — TROPONIN I: Troponin I: <0.3 ng/mL (ref <0.30)

## 2013-03-05 LAB — CBC
Hemoglobin: 14.3 g/dL (ref 13.0–17.0)
MCH: 30.6 pg (ref 26.0–34.0)
MCV: 88.9 fL (ref 78.0–100.0)
RBC: 4.68 MIL/uL (ref 4.22–5.81)
WBC: 8.5 10*3/uL (ref 4.0–10.5)

## 2013-03-05 MED ORDER — SODIUM CHLORIDE 0.9 % IV SOLN
Freq: Once | INTRAVENOUS | Status: AC
  Administered 2013-03-05: 20 mL/h via INTRAVENOUS

## 2013-03-05 MED ORDER — ONDANSETRON HCL 4 MG/2ML IJ SOLN
4.0000 mg | Freq: Once | INTRAMUSCULAR | Status: AC
  Administered 2013-03-05: 4 mg via INTRAVENOUS
  Filled 2013-03-05: qty 2

## 2013-03-05 MED ORDER — ASPIRIN 81 MG PO CHEW
243.0000 mg | CHEWABLE_TABLET | Freq: Once | ORAL | Status: AC
  Administered 2013-03-05: 243 mg via ORAL
  Filled 2013-03-05: qty 3

## 2013-03-05 NOTE — ED Provider Notes (Signed)
History    This chart was scribed for [name] Gaskins, MD by Leone Payor, ED Scribe. This patient was seen in room APA09/AP[facility]09 and the patient's care was started 11:50 PM.   CSN: [id number]  Arrival date & time 03/05/13  2210   First MD Initiated Contact with Patient 03/05/13 2336      Chief Complaint  Patient presents with  . Chest Pain    CP, Abdominal pain, nausea     Patient is a 67 y.o. male presenting with abdominal pain. The history is provided by the patient. No language interpreter was used.  Abdominal Pain This is a new problem. The current episode started 3 to 5 hours ago. The problem occurs constantly. The problem has not changed since onset.Associated symptoms include abdominal pain. Pertinent negatives include no chest pain and no shortness of breath. Nothing aggravates the symptoms. Nothing relieves the symptoms. He has tried nothing for the symptoms.    HPI Comments: [name] is a 67 y.o. male who presents to the Emergency Department complaining of constant, unchanged, non-radiating epigastric pain that started a few hours ago. States he did not eat dinner today. Has h/o stroke on R side and CABG. He denies vomiting, chest pain, SOB, diaphoresis, bloody or black stools. Pt has h/o HTN, DM, CAD, systolic CHF. Pt is a former smoker but denies alcohol use.   Past Medical History  Diagnosis Date  . Sinus bradycardia   . Tobacco abuse   . Chronic renal insufficiency   . Hypertension   . Osteoarthritis   . Diabetes mellitus   . Anemia     Multifactorial  - hx of B12 def and iron deficiency, followed by heme  . Mitral regurgitation   . Systolic CHF, chronic     Previously taken off lisinopril by primary doctor due to labs  . Carotid stenosis     a. Prior carotid dz/ surgery. b. Carotid dopp 07/2011 - 0-39% bilaterally.;   c. Doppler 11/13: 40-59% RICA, 0-39% LICA  . Cardiomyopathy, ischemic     a. Chronic systolic CHF s/p St. Jude dual chamber ICD 03/2008.  [name]  Hx of CABG   . CAD (coronary artery disease)     a. STEMI s/p CABG 07/2007 (had preop cardiogenic shock, IABP, VDRF before surgery)  . Stroke     a. CVA 1990s - chronic pain in RUE after stroke.  [name] PAF (paroxysmal atrial fibrillation)     a. Poor Coumadin candidate due to hx of GI bleed.  . GI bleed     Reported history  . Splenomegaly     Past Surgical History  Procedure Laterality Date  . Coronary artery bypass graft    . Percutaneous coronary intervention  01/10/2005    Charlies Constable, MD  . Coronary artery bypass graft  08/29/2007    x3; Salvatore Decent. Cornelius Moras MD  . Cardiac defibrillator placement  04/05/2008    Implantation of a St. Jude dual-chamber defibrillator, Doylene Canning. Ladona Ridgel , MD  . Cardiac defibrillator placement      Family History  Problem Relation Age of Onset  . Heart attack Mother     CVA, MI  . Diabetes Mother   . Heart attack Father     MI  . Diabetes Sister   . Coronary artery disease Brother     CABG    History  Substance Use Topics  . Smoking status: Former Smoker -- 1.00 packs/day for 15 years    Quit date:  09/29/1990  . Smokeless tobacco: Never Used  . Alcohol Use: No      Review of Systems  Respiratory: Negative for shortness of breath.   Cardiovascular: Negative for chest pain.  Gastrointestinal: Positive for abdominal pain. Negative for nausea, vomiting and blood in stool.  Neurological: Positive for weakness (mild in L arm).  All other systems reviewed and are negative.    Allergies  Codeine  Home Medications   Current Outpatient Rx  Name  Route  Sig  Dispense  Refill  . aspirin EC 81 MG EC tablet   Oral   Take 1 tablet (81 mg total) by mouth daily.         [name] atorvastatin (LIPITOR) 80 MG tablet   Oral   Take 40 mg by mouth every other day.          . baclofen (LIORESAL) 20 MG tablet   Oral   Take 40 mg by mouth at bedtime.          . carvedilol (COREG) 12.5 MG tablet   Oral   Take 12.5 mg by mouth 2 (two) times  daily.         . clopidogrel (PLAVIX) 75 MG tablet   Oral   Take 1 tablet (75 mg total) by mouth daily.   30 tablet   5   . furosemide (LASIX) 40 MG tablet   Oral   Take 20 mg by mouth daily.          [name] glimepiride (AMARYL) 4 MG tablet   Oral   Take 4 mg by mouth 2 (two) times daily.          [name] linagliptin (TRADJENTA) 5 MG TABS tablet   Oral   Take 5 mg by mouth daily.         . meclizine (ANTIVERT) 25 MG tablet   Oral   Take 25 mg by mouth 4 (four) times daily as needed. For dizziness         . spironolactone (ALDACTONE) 25 MG tablet   Oral   Take 25 mg by mouth daily.         . traMADol (ULTRAM) 50 MG tablet   Oral   Take 50 mg by mouth 3 (three) times daily. For pain         . nitroGLYCERIN (NITROSTAT) 0.4 MG SL tablet   Sublingual   Place 0.4 mg under the tongue every 5 (five) minutes as needed. For chest pain           BP 143/75  Pulse 59  Temp(Src) 97.7 F (36.5 C) (Oral)  Resp 16  Ht 6\' 1"  (1.854 m)  Wt 205 lb (92.987 kg)  BMI 27.05 kg/m2  SpO2 97%  Physical Exam CONSTITUTIONAL: Well developed/well nourished HEAD: Normocephalic/atraumatic EYES: EOMI/PERRL ENMT: Mucous membranes moist NECK: supple no meningeal signs CV: S1/S2 noted, no murmurs/rubs/gallops noted LUNGS: Lungs are clear to auscultation bilaterally, no apparent distress ABDOMEN: soft, no rebound or guarding. Moderate epigastric and RUQ pain.  GU:no cva tenderness NEURO: Pt is awake/alert, moves all extremitiesx4, no focal weakness noted in his left UE EXTREMITIES: pulses normal, full ROM SKIN: warm, color normal PSYCH: no abnormalities of mood noted  ED Course  Procedures  DIAGNOSTIC STUDIES: Oxygen Saturation is 97% on room air, adequate by my interpretation.    COORDINATION OF CARE: 11:50 PM Discussed treatment plan with pt at bedside and pt agreed to plan.   Labs Reviewed  CBC - Abnormal; Notable  for the following:    Platelets 148 (*)    All other  components within normal limits  BASIC METABOLIC PANEL - Abnormal; Notable for the following:    Glucose, Bld 246 (*)    GFR calc non Af Amer 75 (*)    GFR calc Af Amer 87 (*)    All other components within normal limits  TROPONIN I   Dg Chest Port 1 View  03/05/2013   *RADIOLOGY REPORT*  Clinical Data: Chest pain.  PORTABLE CHEST - 1 VIEW  Comparison: Chest radiograph performed 07/28/2012  Findings: The lungs are well-aerated.  Pulmonary vascularity is at the upper limits of normal.  There is no evidence of focal opacification, pleural effusion or pneumothorax.  The heart is borderline normal in size.  The patient is status post median sternotomy, with evidence of prior CABG.  A pacemaker/AICD is seen overlying the right chest wall, with leads ending overlying the right atrium and right ventricle.  No acute osseous abnormalities are seen.  Scattered clips are seen overlying the right lung apex.  IMPRESSION: No acute cardiopulmonary process seen.   Original Report Authenticated By: Tonia Ghent, M.D.    2:19 AM Pt found to have acute cholecystitis D/w dr ziegler.  Will admit, but he does not want antibiotics at this time Pt would feel comfortable with admission He is clinically stable but will require clearance due to multiple co-morbidities    MDM  Nursing notes including past medical history and social history reviewed and considered in documentation Labs/vital reviewed and considered     Date: 03/06/2013  Rate: 60  Rhythm: paced rhythm  QRS Axis: left  Intervals: normal  ST/T Wave abnormalities: t wave inversions  Conduction Disutrbances:none  Narrative Interpretation:   Old EKG Reviewed: unchanged     I personally performed the services described in this documentation, which was scribed in my presence. The recorded information has been reviewed and is accurate.     [name] Gaskins, MD 03/06/13 [phone]

## 2013-03-05 NOTE — ED Notes (Signed)
Patient now admits that for past two days has intermittant left arm pain.  States he took a baby asa tonight beofre coming to ED.  (three more given in ED)

## 2013-03-05 NOTE — ED Notes (Signed)
States took laxative last pm for constipation, had loose stool. Onst 13:00 today of abdominal discomfort, dry heaves.  Other family member ill with similar illness

## 2013-03-06 ENCOUNTER — Encounter (HOSPITAL_COMMUNITY): Payer: Self-pay | Admitting: *Deleted

## 2013-03-06 ENCOUNTER — Emergency Department (HOSPITAL_COMMUNITY): Payer: Medicare Other

## 2013-03-06 LAB — HEPATIC FUNCTION PANEL
ALT: 295 U/L — ABNORMAL HIGH (ref 0–53)
AST: 470 U/L — ABNORMAL HIGH (ref 0–37)
Alkaline Phosphatase: 175 U/L — ABNORMAL HIGH (ref 39–117)
Indirect Bilirubin: 0.6 mg/dL (ref 0.3–0.9)
Total Protein: 7.1 g/dL (ref 6.0–8.3)

## 2013-03-06 MED ORDER — SPIRONOLACTONE 25 MG PO TABS
25.0000 mg | ORAL_TABLET | Freq: Every day | ORAL | Status: DC
  Administered 2013-03-06 – 2013-03-07 (×2): 25 mg via ORAL
  Filled 2013-03-06 (×2): qty 1

## 2013-03-06 MED ORDER — MECLIZINE HCL 12.5 MG PO TABS
25.0000 mg | ORAL_TABLET | Freq: Four times a day (QID) | ORAL | Status: DC | PRN
  Administered 2013-03-06: 25 mg via ORAL
  Filled 2013-03-06: qty 1

## 2013-03-06 MED ORDER — PANTOPRAZOLE SODIUM 40 MG IV SOLR
40.0000 mg | Freq: Every day | INTRAVENOUS | Status: DC
  Administered 2013-03-06 – 2013-03-07 (×2): 40 mg via INTRAVENOUS
  Filled 2013-03-06 (×2): qty 40

## 2013-03-06 MED ORDER — FUROSEMIDE 20 MG PO TABS
20.0000 mg | ORAL_TABLET | Freq: Every day | ORAL | Status: DC
  Administered 2013-03-06 – 2013-03-07 (×2): 20 mg via ORAL
  Filled 2013-03-06 (×2): qty 1

## 2013-03-06 MED ORDER — HYDROMORPHONE HCL PF 1 MG/ML IJ SOLN
1.0000 mg | INTRAMUSCULAR | Status: DC | PRN
  Administered 2013-03-06: 2 mg via INTRAVENOUS
  Administered 2013-03-07 (×2): 1 mg via INTRAVENOUS
  Filled 2013-03-06: qty 1
  Filled 2013-03-06: qty 2
  Filled 2013-03-06: qty 1

## 2013-03-06 MED ORDER — ONDANSETRON HCL 4 MG/2ML IJ SOLN: 4.0000 mg | Freq: Four times a day (QID) | INTRAMUSCULAR | Status: DC | PRN

## 2013-03-06 MED ORDER — CARVEDILOL 12.5 MG PO TABS
12.5000 mg | ORAL_TABLET | Freq: Two times a day (BID) | ORAL | Status: DC
  Administered 2013-03-06 – 2013-03-07 (×4): 12.5 mg via ORAL
  Filled 2013-03-06 (×4): qty 1

## 2013-03-06 MED ORDER — LACTATED RINGERS IV SOLN
INTRAVENOUS | Status: DC
  Administered 2013-03-06 – 2013-03-07 (×4): via INTRAVENOUS

## 2013-03-06 MED ORDER — ONDANSETRON HCL 4 MG/2ML IJ SOLN
4.0000 mg | Freq: Four times a day (QID) | INTRAMUSCULAR | Status: DC | PRN
  Administered 2013-03-06: 4 mg via INTRAVENOUS
  Filled 2013-03-06: qty 2

## 2013-03-06 MED ORDER — BACLOFEN 10 MG PO TABS
40.0000 mg | ORAL_TABLET | Freq: Every day | ORAL | Status: DC
  Administered 2013-03-06 – 2013-03-07 (×2): 40 mg via ORAL
  Filled 2013-03-06 (×2): qty 4

## 2013-03-06 MED ORDER — IOHEXOL 300 MG/ML  SOLN
50.0000 mL | Freq: Once | INTRAMUSCULAR | Status: AC | PRN
  Administered 2013-03-06: 50 mL via ORAL

## 2013-03-06 MED ORDER — HYDROMORPHONE HCL PF 1 MG/ML IJ SOLN
1.0000 mg | INTRAMUSCULAR | Status: DC | PRN
  Administered 2013-03-06: 1 mg via INTRAVENOUS
  Filled 2013-03-06 (×2): qty 1

## 2013-03-06 MED ORDER — DEXTROSE 5 % IV SOLN
1.0000 g | Freq: Four times a day (QID) | INTRAVENOUS | Status: DC
  Administered 2013-03-06 – 2013-03-08 (×7): 1 g via INTRAVENOUS
  Filled 2013-03-06 (×8): qty 1

## 2013-03-06 MED ORDER — ENOXAPARIN SODIUM 40 MG/0.4ML ~~LOC~~ SOLN
40.0000 mg | Freq: Every day | SUBCUTANEOUS | Status: DC
  Administered 2013-03-06 – 2013-03-07 (×2): 40 mg via SUBCUTANEOUS
  Filled 2013-03-06 (×2): qty 0.4

## 2013-03-06 MED ORDER — IOHEXOL 300 MG/ML  SOLN
100.0000 mL | Freq: Once | INTRAMUSCULAR | Status: AC | PRN
  Administered 2013-03-06: 100 mL via INTRAVENOUS

## 2013-03-06 NOTE — H&P (Signed)
[name] is an 67 y.o. male.   Chief Complaint: Abdominal pain HPI: Patient presented with several days of increasing abdominal pain.  Mostly right upper quadrant with some radiation to back.  Colicky in nature.  +Fever and chills.  No jaundice.  No similar symptoms in the past.  No change with stools.  No melena, no hematochezia.    Past Medical History  Diagnosis Date  . Sinus bradycardia   . Tobacco abuse   . Chronic renal insufficiency   . Hypertension   . Osteoarthritis   . Diabetes mellitus   . Anemia     Multifactorial  - hx of B12 def and iron deficiency, followed by heme  . Mitral regurgitation   . Systolic CHF, chronic     Previously taken off lisinopril by primary doctor due to labs  . Carotid stenosis     a. Prior carotid dz/ surgery. b. Carotid dopp 07/2011 - 0-39% bilaterally.;   c. Doppler 11/13: 40-59% RICA, 0-39% LICA  . Cardiomyopathy, ischemic     a. Chronic systolic CHF s/p St. Jude dual chamber ICD 03/2008.  [name] Hx of CABG   . CAD (coronary artery disease)     a. STEMI s/p CABG 07/2007 (had preop cardiogenic shock, IABP, VDRF before surgery)  . Stroke     a. CVA 1990s - chronic pain in RUE after stroke.  [name] PAF (paroxysmal atrial fibrillation)     a. Poor Coumadin candidate due to hx of GI bleed.  . GI bleed     Reported history  . Splenomegaly     Past Surgical History  Procedure Laterality Date  . Coronary artery bypass graft    . Percutaneous coronary intervention  01/10/2005    Charlies Constable, MD  . Coronary artery bypass graft  08/29/2007    x3; Salvatore Decent. Cornelius Moras MD  . Cardiac defibrillator placement  04/05/2008    Implantation of a St. Jude dual-chamber defibrillator, Doylene Canning. Ladona Ridgel , MD  . Cardiac defibrillator placement      Family History  Problem Relation Age of Onset  . Heart attack Mother     CVA, MI  . Diabetes Mother   . Heart attack Father     MI  . Diabetes Sister   . Coronary artery disease Brother     CABG   Social  History:  reports that he quit smoking about 22 years ago. He has never used smokeless tobacco. He reports that he does not drink alcohol or use illicit drugs.  Allergies:  Allergies  Allergen Reactions  . Codeine     REACTION: G I  distress    Medications Prior to Admission  Medication Sig Dispense Refill  . aspirin EC 81 MG EC tablet Take 1 tablet (81 mg total) by mouth daily.      [name] atorvastatin (LIPITOR) 80 MG tablet Take 40 mg by mouth every other day.       . baclofen (LIORESAL) 20 MG tablet Take 40 mg by mouth at bedtime.       . carvedilol (COREG) 12.5 MG tablet Take 12.5 mg by mouth 2 (two) times daily.      . clopidogrel (PLAVIX) 75 MG tablet Take 1 tablet (75 mg total) by mouth daily.  30 tablet  5  . furosemide (LASIX) 40 MG tablet Take 20 mg by mouth daily.       [name] glimepiride (AMARYL) 4 MG tablet Take 4 mg by mouth 2 (two) times daily.       [name]  linagliptin (TRADJENTA) 5 MG TABS tablet Take 5 mg by mouth daily.      . meclizine (ANTIVERT) 25 MG tablet Take 25 mg by mouth 4 (four) times daily as needed. For dizziness      . spironolactone (ALDACTONE) 25 MG tablet Take 25 mg by mouth daily.      . traMADol (ULTRAM) 50 MG tablet Take 50 mg by mouth 3 (three) times daily. For pain      . nitroGLYCERIN (NITROSTAT) 0.4 MG SL tablet Place 0.4 mg under the tongue every 5 (five) minutes as needed. For chest pain        Results for orders placed during the hospital encounter of 03/05/13 (from the past 48 hour(s))  CBC     Status: Abnormal   Collection Time    03/05/13 11:06 PM      Result Value Range   WBC 8.5  4.0 - 10.5 K/uL   RBC 4.68  4.22 - 5.81 MIL/uL   Hemoglobin 14.3  13.0 - 17.0 g/dL   HCT 08.6  57.8 - 46.9 %   MCV 88.9  78.0 - 100.0 fL   MCH 30.6  26.0 - 34.0 pg   MCHC 34.4  30.0 - 36.0 g/dL   RDW 62.9  52.8 - 41.3 %   Platelets 148 (*) 150 - 400 K/uL  BASIC METABOLIC PANEL     Status: Abnormal   Collection Time    03/05/13 11:06 PM      Result Value Range    Sodium 136  135 - 145 mEq/L   Potassium 3.8  3.5 - 5.1 mEq/L   Chloride 98  96 - 112 mEq/L   CO2 26  19 - 32 mEq/L   Glucose, Bld 246 (*) 70 - 99 mg/dL   BUN 19  6 - 23 mg/dL   Creatinine, Ser 2.44  0.50 - 1.35 mg/dL   Calcium 9.3  8.4 - 01.0 mg/dL   GFR calc non Af Amer 75 (*) >90 mL/min   GFR calc Af Amer 87 (*) >90 mL/min   Comment:            The eGFR has been calculated     using the CKD EPI equation.     This calculation has not been     validated in all clinical     situations.     eGFR's persistently     <90 mL/min signify     possible Chronic Kidney Disease.  TROPONIN I     Status: None   Collection Time    03/05/13 11:06 PM      Result Value Range   Troponin I <0.30  <0.30 ng/mL   Comment:            Due to the release kinetics of cTnI,     a negative result within the first hours     of the onset of symptoms does not rule out     myocardial infarction with certainty.     If myocardial infarction is still suspected,     repeat the test at appropriate intervals.  HEPATIC FUNCTION PANEL     Status: Abnormal   Collection Time    03/05/13 11:06 PM      Result Value Range   Total Protein 7.1  6.0 - 8.3 g/dL   Albumin 4.1  3.5 - 5.2 g/dL   AST 272 (*) 0 - 37 U/L   ALT 295 (*) 0 - 53 U/L  Alkaline Phosphatase 175 (*) 39 - 117 U/L   Total Bilirubin 1.9 (*) 0.3 - 1.2 mg/dL   Bilirubin, Direct 1.3 (*) 0.0 - 0.3 mg/dL   Indirect Bilirubin 0.6  0.3 - 0.9 mg/dL  LIPASE, BLOOD     Status: None   Collection Time    03/05/13 11:06 PM      Result Value Range   Lipase 22  11 - 59 U/L   Ct Abdomen Pelvis W Contrast  03/06/2013   *RADIOLOGY REPORT*  Clinical Data: Chest pain, abdominal pain, nausea and vomiting.  CT ABDOMEN AND PELVIS WITH CONTRAST  Technique:  Multidetector CT imaging of the abdomen and pelvis was performed following the standard protocol during bolus administration of intravenous contrast.  Contrast: 100 mL of Omnipaque 300 IV contrast  Comparison: CT of the  abdomen and pelvis performed 08/25/2007  Findings: The visualized lung bases are clear.  The patient is status post median sternotomy.  Scattered coronary artery calcifications are seen.  The gallbladder wall thickening is noted, with likely trace pericholecystic fluid, concerning for acute cholecystitis.  Mildly increased gallbladder wall enhancement is noted.  No definite stones are characterized on CT.  The common hepatic duct remains normal in caliber.  There is no evidence of gallstone ileus.  Minimal prominence of the intrahepatic biliary ducts may reflect the acute gallbladder process.  The liver is otherwise unremarkable in appearance.  The spleen is within normal limits.  The pancreas and adrenal glands are unremarkable.  The kidneys are unremarkable in appearance.  There is no evidence of hydronephrosis.  No renal or ureteral stones are seen.  No perinephric stranding is appreciated.  No free fluid is identified.  The small bowel is unremarkable in appearance.  The stomach is within normal limits.  No acute vascular abnormalities are seen.  Scattered calcification is noted along the abdominal aorta and its branches.  The appendix is normal in caliber, without evidence for appendicitis.  The transverse colon is mildly redundant; the sigmoid colon is also mildly redundant.  The colon is otherwise unremarkable in appearance.  The bladder is relatively decompressed and grossly unremarkable. The prostate remains normal in size.  No inguinal lymphadenopathy is seen.  No acute osseous abnormalities are identified.  Mild multilevel disc space narrowing and vacuum phenomenon are seen along the lower lumbar spine.  IMPRESSION:  1.  Acute cholecystitis noted, with gallbladder wall thickening and trace pericholecystic fluid.  No definite stones characterized on CT; no evidence of gallstone ileus.  Associated minimal prominence of the intrahepatic biliary ducts. 2.  Scattered coronary artery calcifications seen. 3.   Scattered calcification along the abdominal aorta and its branches.  These results were called by telephone on 03/06/2013 at 01:52 a.m. to Dr. Zadie Rhine, who verbally acknowledged these results.   Original Report Authenticated By: Tonia Ghent, M.D.   Dg Chest Port 1 View  03/05/2013   *RADIOLOGY REPORT*  Clinical Data: Chest pain.  PORTABLE CHEST - 1 VIEW  Comparison: Chest radiograph performed 07/28/2012  Findings: The lungs are well-aerated.  Pulmonary vascularity is at the upper limits of normal.  There is no evidence of focal opacification, pleural effusion or pneumothorax.  The heart is borderline normal in size.  The patient is status post median sternotomy, with evidence of prior CABG.  A pacemaker/AICD is seen overlying the right chest wall, with leads ending overlying the right atrium and right ventricle.  No acute osseous abnormalities are seen.  Scattered clips are seen overlying the  right lung apex.  IMPRESSION: No acute cardiopulmonary process seen.   Original Report Authenticated By: Tonia Ghent, M.D.    Review of Systems  Constitutional: Positive for chills and diaphoresis.  HENT: Negative.   Eyes: Negative.   Respiratory: Negative.   Cardiovascular: Negative.   Gastrointestinal: Positive for nausea, vomiting and abdominal pain (RUQ). Negative for diarrhea, constipation and melena.  Genitourinary: Negative.   Musculoskeletal: Negative.   Skin: Negative.   Neurological: Negative.   Endo/Heme/Allergies: Negative.   Psychiatric/Behavioral: Negative.     Blood pressure 101/57, pulse 60, temperature 97.9 F (36.6 C), temperature source Oral, resp. rate 20, height 6\' 1"  (1.854 m), weight 92.98 kg (204 lb 15.7 oz), SpO2 97.00%. Physical Exam  Constitutional: He appears well-developed and well-nourished. No distress.  HENT:  Head: Normocephalic and atraumatic.  Eyes: Conjunctivae and EOM are normal. Pupils are equal, round, and reactive to light. No scleral icterus.  Neck:  Normal range of motion. Neck supple. No tracheal deviation present. No thyromegaly present.  Cardiovascular: Normal rate and regular rhythm.   Respiratory: Effort normal and breath sounds normal.  GI: Soft. Bowel sounds are normal. He exhibits no distension and no mass. There is tenderness (mild epigastric tenderness.). There is no rebound and no guarding.  Lymphadenopathy:    He has no cervical adenopathy.  Skin: Skin is warm and dry.     Assessment/Plan Acute cholecystitis.  Admit, continue IV abx coverage.  NPO.  IV fluid hydration.  Will need cardiac evaluation.    Kraven Calk C 03/06/2013, 7:31 PM

## 2013-03-06 NOTE — ED Notes (Signed)
Ambulatory to bathroom.  Has completed first bottle of contrast.

## 2013-03-06 NOTE — ED Notes (Signed)
Finished contrast bottle #2

## 2013-03-07 DIAGNOSIS — K819 Cholecystitis, unspecified: Secondary | ICD-10-CM

## 2013-03-07 DIAGNOSIS — K8010 Calculus of gallbladder with chronic cholecystitis without obstruction: Secondary | ICD-10-CM | POA: Diagnosis present

## 2013-03-07 DIAGNOSIS — I2510 Atherosclerotic heart disease of native coronary artery without angina pectoris: Secondary | ICD-10-CM

## 2013-03-07 DIAGNOSIS — I10 Essential (primary) hypertension: Secondary | ICD-10-CM

## 2013-03-07 LAB — BASIC METABOLIC PANEL
CO2: 31 mEq/L (ref 19–32)
Chloride: 101 mEq/L (ref 96–112)
GFR calc Af Amer: 69 mL/min — ABNORMAL LOW (ref >90)
Potassium: 3.8 mEq/L (ref 3.5–5.1)

## 2013-03-07 LAB — CBC
HCT: 41.2 % (ref 39.0–52.0)
Hemoglobin: 14.1 g/dL (ref 13.0–17.0)
WBC: 5.6 10*3/uL (ref 4.0–10.5)

## 2013-03-07 MED ORDER — LISINOPRIL 5 MG PO TABS
2.5000 mg | ORAL_TABLET | Freq: Every day | ORAL | Status: DC
  Administered 2013-03-07: 2.5 mg via ORAL
  Filled 2013-03-07: qty 1

## 2013-03-07 NOTE — Progress Notes (Signed)
Inpatient Diabetes Program Recommendations  AACE/ADA: New Consensus Statement on Inpatient Glycemic Control (2013)  Target Ranges:  Prepandial:   less than 140 mg/dL      Peak postprandial:   less than 180 mg/dL (1-2 hours)      Critically ill patients:  140 - 180 mg/dL   Results for KINDRICK, LANKFORD (MRN [id number]) as of 03/07/2013 09:31  Ref. Range 03/05/2013 23:06 03/07/2013 05:51  Glucose Latest Range: 70-99 mg/dl 409 (H) 94    Inpatient Diabetes Program Recommendations Correction (SSI): Please order CBGs with Novolog correction Q4H while inpatient and NPO. HgbA1C: Please consider ordering an A1C to determine glycemic control over the past 2-3 months.  Note: Patient has a history of diabetes and takes Amaryl 4mg  BID and Tradjenta 5mg  daily at home for diabetes management.  Currently, patient is not ordered to receive any treatment for inpatient glycemic control.  Please order CBGs with Novolog correction Q4H (while NPO) and an A1C.  Will continue to follow.  Thanks, Orlando Penner, RN, MSN, CCRN Diabetes Coordinator Inpatient Diabetes Program [phone]

## 2013-03-07 NOTE — Progress Notes (Signed)
Utilization Review Complete  

## 2013-03-07 NOTE — Consult Note (Signed)
CARDIOLOGY CONSULT NOTE  Patient ID: [name] MRN: [id number] DOB/AGE: [birth year] 67 y.o.  Admit date: 03/05/2013 Referring Physician: Tilford Pillar [name] Primary PhysicianBUTLER, CYNTHIA, DO Primary Cardiologist: [name] Reason for Consultation: Pre-Operative Evaluation for Cholecystectomy  HPI: [name] is 67 y/o patient with multiple medical problems admitted with acute cholecystitis. He has a history of ICM with EF of 35% per echo 2009, S/P St Jude Dual chamber ICD (2009), CAD with CABG 2008,CVA, and carotid artery disease, among a multitude of other medical issues.  We are asked for cardiac evaluation prior to planned surgery. He was last seen by [name] in May of 2014 with pacemaker check. No changes were made to his settings during his evaluation.  AICD has never discharged as the result of arrhythmia.   [name] states that symptoms of abdominal pain, nausea and vomiting occurred suddenly two days ago. Lasted approximately 7-8 hours until presenting to ER. He was found to have elevated LFT's , negative troponin, Na 136, Glucose of 246, Creatinine of 1.02. There was no leukocytosis. CT scan demonstrated acute cholecystitis, without stones or ileus. Coronary calcifications were seen. CXR demonstrated no CHF or pulmonary edema.   He has been kept NPO except for medications. He is no longer complaining of pain or nausea. Surgery is tentatively scheduled for tomorrow. Plavix has been held, but he remains on LMWH at VTE dosing. The patient is concerned about this, as he is afraid of having another MI.      Review of systems complete and found to be negative unless listed above   Past Medical History  Diagnosis Date  . Sinus bradycardia   . Tobacco abuse   . Chronic renal insufficiency   . Hypertension   . Osteoarthritis   . Diabetes mellitus   . Anemia     Multifactorial  - hx of B12 def and iron deficiency, followed by heme  . Mitral regurgitation   . Systolic CHF,  chronic     Previously taken off lisinopril by primary doctor due to labs  . Carotid stenosis     a. Prior carotid dz/ surgery. b. Carotid dopp 07/2011 - 0-39% bilaterally.;   c. Doppler 11/13: 40-59% RICA, 0-39% LICA  . Cardiomyopathy, ischemic     a. Chronic systolic CHF s/p St. Jude dual chamber ICD 03/2008.  [name] Hx of CABG   . CAD (coronary artery disease)     a. STEMI s/p CABG 07/2007 (had preop cardiogenic shock, IABP, VDRF before surgery)  . Stroke     a. CVA 1990s - chronic pain in RUE after stroke.  [name] PAF (paroxysmal atrial fibrillation)     a. Poor Coumadin candidate due to hx of GI bleed.  . GI bleed     Reported history  . Splenomegaly     Family History  Problem Relation Age of Onset  . Heart attack Mother     CVA, MI  . Diabetes Mother   . Heart attack Father     MI  . Diabetes Sister   . Coronary artery disease Brother     CABG    History   Social History  . Marital Status: Married    Spouse Name: N/A    Number of Children: N/A  . Years of Education: N/A   Occupational History  . Retired    Social History Main Topics  . Smoking status: Former Smoker -- 1.00 packs/day for 15 years    Quit date: 09/29/1990  . Smokeless  tobacco: Never Used  . Alcohol Use: No  . Drug Use: No  . Sexually Active: Not on file   Other Topics Concern  . Not on file   Social History Narrative   Lives in [place] with wife   Been on disability since his stroke in the 1990s   Not routinely exercising    Past Surgical History  Procedure Laterality Date  . Coronary artery bypass graft    . Percutaneous coronary intervention  01/10/2005    [name] Constable, [name]  . Coronary artery bypass graft  08/29/2007    x3; [name] Decent. Cornelius Moras [name]  . Cardiac defibrillator placement  04/05/2008    Implantation of a St. Jude dual-chamber defibrillator, [name] , [name]  . Cardiac defibrillator placement       Prescriptions prior to admission  Medication Sig Dispense Refill  . aspirin  EC 81 MG EC tablet Take 1 tablet (81 mg total) by mouth daily.      [name] atorvastatin (LIPITOR) 80 MG tablet Take 40 mg by mouth every other day.       . baclofen (LIORESAL) 20 MG tablet Take 40 mg by mouth at bedtime.       . carvedilol (COREG) 12.5 MG tablet Take 12.5 mg by mouth 2 (two) times daily.      . clopidogrel (PLAVIX) 75 MG tablet Take 1 tablet (75 mg total) by mouth daily.  30 tablet  5  . furosemide (LASIX) 40 MG tablet Take 20 mg by mouth daily.       [name] glimepiride (AMARYL) 4 MG tablet Take 4 mg by mouth 2 (two) times daily.       [name] linagliptin (TRADJENTA) 5 MG TABS tablet Take 5 mg by mouth daily.      . meclizine (ANTIVERT) 25 MG tablet Take 25 mg by mouth 4 (four) times daily as needed. For dizziness      . spironolactone (ALDACTONE) 25 MG tablet Take 25 mg by mouth daily.      . traMADol (ULTRAM) 50 MG tablet Take 50 mg by mouth 3 (three) times daily. For pain      . nitroGLYCERIN (NITROSTAT) 0.4 MG SL tablet Place 0.4 mg under the tongue every 5 (five) minutes as needed. For chest pain       Physical Exam: Blood pressure 156/67, pulse 63, temperature 97.4 F (36.3 C), temperature source Oral, resp. rate 18, height 6\' 1"  (1.854 m), weight 204 lb 15.7 oz (92.98 kg), SpO2 94.00%.  General: Well developed, well nourished, in no acute distress Head: Eyes PERRLA, No xanthomas.   Normal cephalic and atramatic  Lungs: Clear bilaterally to auscultation and percussion. Heart: HRRR S1 S2, without M or R.  Pulses are 2+ & equal. Right infraclavicular AICD; fourth heart sound present             bilateral carotid bruits. No JVD.  No abdominal bruits. No femoral bruits. Abdomen: Bowel sounds are positive, abdomen soft with mild tenderness,  without masses or   Hernia's noted. Msk:  Back normal, Normal strength and tone for age. Extremities: No clubbing, cyanosis or edema.  DP +1SCD's in place. Neuro: Alert and oriented X 3. Psych:  Good affect, responds appropriately  Labs: Lab Results   Component Value Date   WBC 5.6 03/07/2013   HGB 14.1 03/07/2013   HCT 41.2 03/07/2013   MCV 89.8 03/07/2013   PLT 138* 03/07/2013   Cardiac Cath 03/2008 CONCLUSIONS:  1. Moderate reduction in  left ventricular function, but improved from  the previous acute study prior to revascularization surgery.  2. Continued patency of the internal mammary to the left anterior  descending.  3. Continued patency of saphenous vein graft to the obtuse marginal.  4. Continued patency of saphenous vein graft to the posterior  descending.  5. Scattered disease involving the intermediate and the  atrioventricular circumflex distally with moderate stenosis, but  small-to-moderate-sized vessels.  Nuclear Stress Test: 10/31//2013 Abnormal Lexiscan Myoview with chest pain but no diagnostic electrocardiographic changes. The Scintigraphic results show extensive prior distal anteroseptal, apical and inferior infarcts. No ischemia noted. The gated ejection fraction was 37% and there is apical and inferior akinesis and septal hypokinesis. There is evidence of ventricular enlargement.  Echocardiogram 2009 Septal and apical akinesis, anterior hypokinesis The left ventricle was moderately dilated. Overall left ventricular systolic function was moderately to markedly decreased. Left ventricular ejection fraction was estimated to be 35 %. Left ventricular wall thickness was mildly increased. - The aortic valve was mildly calcified. - There was mild mitral valvular regurgitation. - The left atrium was mildly dilated.  Pacemaker interrogation: 02/09/2103 Normal device function. No parameter setting changes.  Recent Labs Lab 03/05/13 2306 03/07/13 0551  NA 136 141  K 3.8 3.8  CL 98 101  CO2 26 31  BUN 19 13  CREATININE 1.02 1.24  CALCIUM 9.3 8.9  PROT 7.1  --   BILITOT 1.9*  --   ALKPHOS 175*  --   ALT 295*  --   AST 470*  --   GLUCOSE 246* 94   Radiology: Ct Abdomen Pelvis W Contrast  03/06/2013 1.  Acute  cholecystitis   No definite stones  2.  Scattered coronary artery calcifications seen. 3.  Scattered calcification along the abdominal aorta and its branches.    Dg Chest  03/05/2013   S/P median sternotomy.  A pacemaker/AICD is seen overlying the right chest wall, with leads ending overlying the right atrium and right ventricle. Scattered clips are seen overlying the right lung apex.  IMPRESSION: No acute cardiopulmonary process seen.   EKG: Rate of 60 bpm Atrial-paced rhythm with prolonged AV conduction Inferior infarct , age undetermined Anteroseptal infarct , age undetermined T wave abnormality, consider lateral ischemia  ASSESSMENT AND PLAN:   1. Acute Cholecystitis; Planned surgery for cholecystectomy.  Low to moderate risk of cardiac event in the setting of CAD,and ischemic CM.  Continue BB, lasix and spironolactone.  2. ICD/Pacemaker in situ: St Jude Pacmaker has been interrogated last month and found to be functioning appropriately. Will need to contact tech to see patient pre-operatively for magnet placement etc due to cautery and post operatively for interrogation. Contacted St. Jude rep, who will be available perioperatively to reprogram device.   3. Ischemic CM: Most recent echocardiogram demonstrates EF of 35%. He is currently on coreg 12.5 mg BID, lasix 20 mg daily, spironolactone 25 mg daily. Recommend addition of ACE inhibitor 2.5 mg of lisinopril daily and monitoring of renal function perio-operatively. Notes state that PCP removed ACE in the setting of renal insufficiency. Current creatinine is 1.24 (Baseline on review of prior labs 1.1-1.3)  4. CAD: He is s/p CABG in 2008, in the setting of NSTEMI, with cardiogenic shock. Cath in 2009 demonstrated continued patency of bypass grafts. Repeat nuclear stress test completed in 2013 revealed  extensive prior distal anteroseptal, apical and inferior infarcts, but no ischemia.   5. Carotid Artery Disease: TIA's with most recent  doppler studies in  2013 demonstrating 40-59% LICA, 0-39% RICA.   6. Hypertension: Blood pressure has been labile since admission. Probably related to pain and acute illness. Continue current medications. Will start low dose lisinopril 2.5 as discussed above.  7. Diabetes  8. Chronic Kidney disease: Monitor BMET closely with addition of low dose ACE.  Bettey Mare. Lyman Bishop [name] [facility] 03/07/2013, 8:32 AM  Cardiology Attending Patient interviewed and examined. Discussed with Joni Reining, [name].  Above note annotated and modified based upon my findings.  In the absence of active congestive heart failure, a history of significant arrhythmias and symptoms to suggest myocardial ischemia, the proposed surgical procedure can be performed with acceptable risk of cardiac complications. Of most concern is persistent antiplatelet effect of clopidogrel, which should be substantial after only 48 hours off drug. Other cardiac medications are appropriate and will be continued. Surgery should ideally be performed while treatment with aspirin is continued if this can be done safely. Device can be inactivated immediately preop and baseline settings restored postoperatively. We will be available perioperatively if needed.   Aiea Bing, [name] 03/07/2013, 6:03 PM

## 2013-03-07 NOTE — Progress Notes (Signed)
Subjective: Pain and nausea have resolved.  Objective: Vital signs in last 24 hours: Temp:  [97.4 F (36.3 C)-97.5 F (36.4 C)] 97.4 F (36.3 C) (06/09 1403) Pulse Rate:  [62-79] 62 (06/09 1403) Resp:  [18-20] 20 (06/09 1403) BP: (114-156)/(67-73) 114/73 mmHg (06/09 1403) SpO2:  [94 %-96 %] 96 % (06/09 1403) Last BM Date: 03/05/13  Intake/Output from previous day: 06/08 0701 - 06/09 0700 In: 743.3 [P.O.:240; I.V.:453.3; IV Piggyback:50] Out: 875 [Urine:875] Intake/Output this shift:    General appearance: alert, cooperative, appears stated age and no distress GI: soft, non-tender; bowel sounds normal; no masses,  no organomegaly  Lab Results:   Recent Labs  03/05/13 2306 03/07/13 0551  WBC 8.5 5.6  HGB 14.3 14.1  HCT 41.6 41.2  PLT 148* 138*   BMET  Recent Labs  03/05/13 2306 03/07/13 0551  NA 136 141  K 3.8 3.8  CL 98 101  CO2 26 31  GLUCOSE 246* 94  BUN 19 13  CREATININE 1.02 1.24  CALCIUM 9.3 8.9   PT/INR No results found for this basename: LABPROT, INR,  in the last 72 hours  Studies/Results: Ct Abdomen Pelvis W Contrast  03/06/2013   *RADIOLOGY REPORT*  Clinical Data: Chest pain, abdominal pain, nausea and vomiting.  CT ABDOMEN AND PELVIS WITH CONTRAST  Technique:  Multidetector CT imaging of the abdomen and pelvis was performed following the standard protocol during bolus administration of intravenous contrast.  Contrast: 100 mL of Omnipaque 300 IV contrast  Comparison: CT of the abdomen and pelvis performed 08/25/2007  Findings: The visualized lung bases are clear.  The patient is status post median sternotomy.  Scattered coronary artery calcifications are seen.  The gallbladder wall thickening is noted, with likely trace pericholecystic fluid, concerning for acute cholecystitis.  Mildly increased gallbladder wall enhancement is noted.  No definite stones are characterized on CT.  The common hepatic duct remains normal in caliber.  There is no  evidence of gallstone ileus.  Minimal prominence of the intrahepatic biliary ducts may reflect the acute gallbladder process.  The liver is otherwise unremarkable in appearance.  The spleen is within normal limits.  The pancreas and adrenal glands are unremarkable.  The kidneys are unremarkable in appearance.  There is no evidence of hydronephrosis.  No renal or ureteral stones are seen.  No perinephric stranding is appreciated.  No free fluid is identified.  The small bowel is unremarkable in appearance.  The stomach is within normal limits.  No acute vascular abnormalities are seen.  Scattered calcification is noted along the abdominal aorta and its branches.  The appendix is normal in caliber, without evidence for appendicitis.  The transverse colon is mildly redundant; the sigmoid colon is also mildly redundant.  The colon is otherwise unremarkable in appearance.  The bladder is relatively decompressed and grossly unremarkable. The prostate remains normal in size.  No inguinal lymphadenopathy is seen.  No acute osseous abnormalities are identified.  Mild multilevel disc space narrowing and vacuum phenomenon are seen along the lower lumbar spine.  IMPRESSION:  1.  Acute cholecystitis noted, with gallbladder wall thickening and trace pericholecystic fluid.  No definite stones characterized on CT; no evidence of gallstone ileus.  Associated minimal prominence of the intrahepatic biliary ducts. 2.  Scattered coronary artery calcifications seen. 3.  Scattered calcification along the abdominal aorta and its branches.  These results were called by telephone on 03/06/2013 at 01:52 a.m. to Dr. Zadie Rhine, who verbally acknowledged these results.  Original Report Authenticated By: Tonia Ghent, M.D.   Dg Chest Port 1 View  03/05/2013   *RADIOLOGY REPORT*  Clinical Data: Chest pain.  PORTABLE CHEST - 1 VIEW  Comparison: Chest radiograph performed 07/28/2012  Findings: The lungs are well-aerated.  Pulmonary  vascularity is at the upper limits of normal.  There is no evidence of focal opacification, pleural effusion or pneumothorax.  The heart is borderline normal in size.  The patient is status post median sternotomy, with evidence of prior CABG.  A pacemaker/AICD is seen overlying the right chest wall, with leads ending overlying the right atrium and right ventricle.  No acute osseous abnormalities are seen.  Scattered clips are seen overlying the right lung apex.  IMPRESSION: No acute cardiopulmonary process seen.   Original Report Authenticated By: Tonia Ghent, M.D.    Anti-infectives: Anti-infectives   Start     Dose/Rate Route Frequency Ordered Stop   03/06/13 1500  cefOXitin (MEFOXIN) 1 g in dextrose 5 % 50 mL IVPB     1 g 100 mL/hr over 30 Minutes Intravenous 4 times per day 03/06/13 1257        Assessment/Plan: Impression: Cholecystitis, cholelithiasis, resolving. Patient has multiple medical problems including coronary artery disease, cardiomyopathy, history of CVA Plan: A long discussion with the patient. He has stated in the past that his cardiologist, Dr. Daleen Squibb, has told him he is too sick to undergo any significant surgical procedure. The patient is very resistant to having surgery done at this time. He is at increased risk for a cardiac event. No need for acute surgical intervention at this time. Will advance diet. Will check liver test in a.m. All questions were answered.  LOS: 2 days    Vici Novick A 03/07/2013

## 2013-03-07 NOTE — Care Management Note (Signed)
    Page 1 of 1   03/08/2013     9:22:02 AM   CARE MANAGEMENT NOTE 03/08/2013  Patient:  BRAVE, DACK   Account Number:  [id number]  Date Initiated:  03/07/2013  Documentation initiated by:  Rosemary Holms  Subjective/Objective Assessment:   Pt admitted from home with wife. Acute Choli. Prev. [facility]. Pt and wife fearful pt will not survive surgery based on what Dr. Margo Aye once told them. Lost Cell phone which CM reported.     Action/Plan:   Anticipated DC Date:  03/08/2013   Anticipated DC Plan:  HOME W HOME HEALTH SERVICES      DC Planning Services  CM consult      Choice offered to / List presented to:             Status of service:  Completed, signed off Medicare Important Message given?  YES (If response is "NO", the following Medicare IM given date fields will be blank) Date Medicare IM given:  03/08/2013 Date Additional Medicare IM given:    Discharge Disposition:  HOME/SELF CARE  Per UR Regulation:    If discussed at Long Length of Stay Meetings, dates discussed:    Comments:  03/07/13 Rosemary Holms RN BSN CM

## 2013-03-08 LAB — BASIC METABOLIC PANEL
Calcium: 9.5 mg/dL (ref 8.4–10.5)
GFR calc Af Amer: 77 mL/min — ABNORMAL LOW (ref >90)
GFR calc non Af Amer: 66 mL/min — ABNORMAL LOW (ref >90)
Glucose, Bld: 139 mg/dL — ABNORMAL HIGH (ref 70–99)
Potassium: 3.6 mEq/L (ref 3.5–5.1)
Sodium: 141 mEq/L (ref 135–145)

## 2013-03-08 LAB — HEPATIC FUNCTION PANEL
ALT: 185 U/L — ABNORMAL HIGH (ref 0–53)
AST: 80 U/L — ABNORMAL HIGH (ref 0–37)
Albumin: 3.8 g/dL (ref 3.5–5.2)
Alkaline Phosphatase: 246 U/L — ABNORMAL HIGH (ref 39–117)
Total Protein: 6.9 g/dL (ref 6.0–8.3)

## 2013-03-08 LAB — CBC
Hemoglobin: 15.2 g/dL (ref 13.0–17.0)
Platelets: 187 10*3/uL (ref 150–400)
RBC: 4.98 MIL/uL (ref 4.22–5.81)
WBC: 5.8 10*3/uL (ref 4.0–10.5)

## 2013-03-08 NOTE — Discharge Summary (Signed)
Physician Discharge Summary  Patient ID: [name] MRN: [id number] DOB/AGE: 66/01/1946 66 y.o.  Admit date: 03/05/2013 Discharge date: 03/08/2013  Admission Diagnoses: Cholecystitis  Discharge Diagnoses: Same Active Problems:   HYPERTENSION   CARDIOMYOPATHY, ISCHEMIC   RENAL INSUFFICIENCY, CHRONIC   AUTOMATIC IMPLANTABLE CARDIAC DEFIBRILLATOR SITU   Cholecystitis with cholelithiasis   Discharged Condition: good  Hospital Course: Patient is a 67 year old white male who presented emergency room with worsening right upper quadrant abdominal pain. CT scan of the abdomen revealed cholecystitis. He was admitted to the hospital for further evaluation treatment. He was seen by cardiology and cleared for surgery, though it is known that he is a high risk candidate for general endotracheal anesthesia. His liver tests have improved and his abdominal pain has almost resolved. He sees Dr. Daleen Squibb of cardiology who has advised him in the past not to undergo any surgical intervention. The patient and family are resistant to any surgical intervention at this time. As his symptoms have improved, there was no need for acute surgical intervention at this time. They are fully aware of that he could have another episode of cholecystitis which may require surgical intervention.  Consults: cardiology  Discharge Exam: Blood pressure 121/60, pulse 68, temperature 97.9 F (36.6 C), temperature source Oral, resp. rate 20, height 6\' 1"  (1.854 m), weight 92.98 kg (204 lb 15.7 oz), SpO2 93.00%. General appearance: alert, cooperative and no distress Resp: clear to auscultation bilaterally Cardio: regular rate and rhythm, S1, S2 normal, no murmur, click, rub or gallop GI: soft, non-tender; bowel sounds normal; no masses,  no organomegaly  Disposition: 01-Home or Self Care   Future Appointments Provider Department Dept Phone   03/31/2013 8:00 AM [facility]  [phone]   04/07/2013 4:00 PM Gaylord Shih, MD [facility] Main Office [place]) [phone]   04/28/2013 8:00 AM [facility] In[facility] Nurse [facility] [phone]   05/11/2013 9:00 AM [facility] [employer] Main Office [place]   05/26/2013 8:00 AM [facility] In[facility] Nurse [facility] [phone]   06/23/2013 9:30 AM Krista [facility] [phone]   06/23/2013 10:00 AM Samul Dada, MD [facility] MEDICAL ONCOLOGY [phone]   06/23/2013 11:00 AM [facility] Nurse [facility] [phone]   07/21/2013 8:00 AM [facility] In[facility] Nurse [facility] [phone]   08/18/2013 8:00 AM [facility] In[facility] Nurse [place] [facility] [phone]   09/15/2013 8:00 AM [facility] Nurse [facility] [phone]       Medication List    TAKE these medications       aspirin 81 MG EC tablet  Take 1 tablet (81 mg total) by mouth daily.     atorvastatin 80 MG tablet  Commonly known as:  LIPITOR  Take 40 mg by mouth every other day.     baclofen 20 MG tablet  Commonly known as:  LIORESAL  Take 40 mg by mouth at bedtime.     carvedilol 12.5 MG tablet  Commonly known as:  COREG  Take 12.5 mg by mouth 2 (two) times daily.     clopidogrel 75 MG tablet  Commonly known as:  PLAVIX  Take 1 tablet (75 mg total) by mouth daily.     furosemide 40 MG tablet  Commonly known as:  LASIX  Take 20 mg by  mouth daily.     glimepiride 4 MG tablet  Commonly known as:  AMARYL  Take 4 mg by mouth 2 (two) times daily.     linagliptin 5 MG Tabs tablet  Commonly known as:  TRADJENTA  Take 5 mg by mouth daily.     meclizine 25 MG tablet  Commonly known as:  ANTIVERT  Take 25 mg by mouth 4 (four) times daily as needed. For dizziness      nitroGLYCERIN 0.4 MG SL tablet  Commonly known as:  NITROSTAT  Place 0.4 mg under the tongue every 5 (five) minutes as needed. For chest pain     spironolactone 25 MG tablet  Commonly known as:  ALDACTONE  Take 25 mg by mouth daily.     traMADol 50 MG tablet  Commonly known as:  ULTRAM  Take 50 mg by mouth 3 (three) times daily. For pain           Follow-up Information   Follow up with Dalia Heading, MD. (As needed)    Contact information:   [address] [place] [address] [phone]       Signed: Franky Macho A 03/08/2013, 8:13 AM

## 2013-03-09 ENCOUNTER — Ambulatory Visit: Payer: Medicare Other | Admitting: Cardiology

## 2013-03-09 ENCOUNTER — Telehealth: Payer: Self-pay | Admitting: Cardiology

## 2013-03-09 NOTE — Telephone Encounter (Signed)
Pt was just discharged from [facility].  C/o of recurrent gallbladder pain.  Advised pt to call Dr. Franky Macho per discharge summary.  Pt agreed.

## 2013-03-09 NOTE — Telephone Encounter (Signed)
New Prob      Pt states he is having some pain in his gallbladder, pt is concerned and would like to speak to nurse. Pt states PCP will not do anything for him and would like to seek advice from cardiologist. Please call.

## 2013-03-31 ENCOUNTER — Ambulatory Visit (HOSPITAL_BASED_OUTPATIENT_CLINIC_OR_DEPARTMENT_OTHER): Payer: Medicare Other

## 2013-03-31 ENCOUNTER — Other Ambulatory Visit: Payer: Self-pay | Admitting: Cardiology

## 2013-03-31 VITALS — BP 123/69 | HR 80 | Temp 97.7°F

## 2013-03-31 DIAGNOSIS — D649 Anemia, unspecified: Secondary | ICD-10-CM

## 2013-03-31 DIAGNOSIS — E538 Deficiency of other specified B group vitamins: Secondary | ICD-10-CM

## 2013-03-31 MED ORDER — CYANOCOBALAMIN 1000 MCG/ML IJ SOLN
1000.0000 ug | Freq: Once | INTRAMUSCULAR | Status: AC
  Administered 2013-03-31: 1000 ug via INTRAMUSCULAR

## 2013-04-07 ENCOUNTER — Encounter: Payer: Self-pay | Admitting: Cardiology

## 2013-04-07 ENCOUNTER — Ambulatory Visit (INDEPENDENT_AMBULATORY_CARE_PROVIDER_SITE_OTHER): Payer: Medicare Other | Admitting: Cardiology

## 2013-04-07 VITALS — BP 122/62 | HR 67 | Ht 73.0 in | Wt 203.0 lb

## 2013-04-07 DIAGNOSIS — N189 Chronic kidney disease, unspecified: Secondary | ICD-10-CM

## 2013-04-07 DIAGNOSIS — I2589 Other forms of chronic ischemic heart disease: Secondary | ICD-10-CM

## 2013-04-07 DIAGNOSIS — R0989 Other specified symptoms and signs involving the circulatory and respiratory systems: Secondary | ICD-10-CM

## 2013-04-07 DIAGNOSIS — I679 Cerebrovascular disease, unspecified: Secondary | ICD-10-CM

## 2013-04-07 DIAGNOSIS — I739 Peripheral vascular disease, unspecified: Secondary | ICD-10-CM

## 2013-04-07 DIAGNOSIS — Z95810 Presence of automatic (implantable) cardiac defibrillator: Secondary | ICD-10-CM

## 2013-04-07 DIAGNOSIS — I10 Essential (primary) hypertension: Secondary | ICD-10-CM

## 2013-04-07 DIAGNOSIS — Z951 Presence of aortocoronary bypass graft: Secondary | ICD-10-CM

## 2013-04-07 DIAGNOSIS — K8010 Calculus of gallbladder with chronic cholecystitis without obstruction: Secondary | ICD-10-CM

## 2013-04-07 MED ORDER — NITROGLYCERIN 0.4 MG SL SUBL: 0.4000 mg | SUBLINGUAL_TABLET | SUBLINGUAL | Status: DC | PRN

## 2013-04-07 NOTE — Progress Notes (Signed)
HPI [name] comes in today for evaluation and management of his complex coronary and vascular disease. He was admitted to the [facility] with acute cholecystitis in June. Because of his high risk for surgery, they were able to cool him off with antibiotics and observation. He was discharged home but told this may occur again.  I have advised him not to have any elective knee surgery in the past. Our cardiologist team saw him in [place] and cleared him if it absolutely had to be done. I've explained this thoroughly to him today.  He is very compliant with his medications. He says his blood sugar and blood pressures been under good control. He will have to have carotid Dopplers done again this fall. He's having no symptoms of angina or TIAs.  Past Medical History  Diagnosis Date  . Sinus bradycardia   . Tobacco abuse   . Chronic renal insufficiency   . Hypertension   . Osteoarthritis   . Diabetes mellitus   . Anemia     Multifactorial  - hx of B12 def and iron deficiency, followed by heme  . Mitral regurgitation   . Systolic CHF, chronic     Previously taken off lisinopril by primary doctor due to labs  . Carotid stenosis     a. Prior carotid dz/ surgery. b. Carotid dopp 07/2011 - 0-39% bilaterally.;   c. Doppler 11/13: 40-59% RICA, 0-39% LICA  . Cardiomyopathy, ischemic     a. Chronic systolic CHF s/p St. Jude dual chamber ICD 03/2008.  [name] Hx of CABG   . CAD (coronary artery disease)     a. STEMI s/p CABG 07/2007 (had preop cardiogenic shock, IABP, VDRF before surgery)  . Stroke     a. CVA 1990s - chronic pain in RUE after stroke.  [name] PAF (paroxysmal atrial fibrillation)     a. Poor Coumadin candidate due to hx of GI bleed.  . GI bleed     Reported history  . Splenomegaly     Current Outpatient Prescriptions  Medication Sig Dispense Refill  . aspirin EC 81 MG EC tablet Take 1 tablet (81 mg total) by mouth daily.      [name] atorvastatin (LIPITOR) 80 MG tablet Take 40 mg  by mouth every other day.       . baclofen (LIORESAL) 20 MG tablet Take 40 mg by mouth at bedtime.       . carvedilol (COREG) 12.5 MG tablet Take 12.5 mg by mouth 2 (two) times daily.      . clopidogrel (PLAVIX) 75 MG tablet Take 1 tablet (75 mg total) by mouth daily.  30 tablet  5  . furosemide (LASIX) 40 MG tablet TAKE ONE TABLET BY MOUTH AS DIRECTED  30 tablet  1  . glimepiride (AMARYL) 4 MG tablet Take 4 mg by mouth 2 (two) times daily.       [name] linagliptin (TRADJENTA) 5 MG TABS tablet Take 5 mg by mouth daily.      . meclizine (ANTIVERT) 25 MG tablet Take 25 mg by mouth 4 (four) times daily as needed. For dizziness      . nitroGLYCERIN (NITROSTAT) 0.4 MG SL tablet Place 0.4 mg under the tongue every 5 (five) minutes as needed. For chest pain      . spironolactone (ALDACTONE) 25 MG tablet Take 25 mg by mouth daily.      . traMADol (ULTRAM) 50 MG tablet Take 50 mg by mouth 3 (three) times daily. For  pain       No current facility-administered medications for this visit.    Allergies  Allergen Reactions  . Codeine     REACTION: G I  distress    Family History  Problem Relation Age of Onset  . Heart attack Mother     CVA, MI  . Diabetes Mother   . Heart attack Father     MI  . Diabetes Sister   . Coronary artery disease Brother     CABG    History   Social History  . Marital Status: Married    Spouse Name: N/A    Number of Children: N/A  . Years of Education: N/A   Occupational History  . Retired    Social History Main Topics  . Smoking status: Former Smoker -- 1.00 packs/day for 15 years    Quit date: 09/29/1990  . Smokeless tobacco: Never Used  . Alcohol Use: No  . Drug Use: No  . Sexually Active: Not on file   Other Topics Concern  . Not on file   Social History Narrative   Lives in [place] with wife   Been on disability since his stroke in the 1990s   Not routinely exercising    ROS ALL NEGATIVE EXCEPT THOSE NOTED IN HPI  PE  General Appearance:  well developed, well nourished in no acute distress, looks older than stated age HEENT: symmetrical face, PERRLA, poor dentition Neck: no JVD, thyromegaly, or adenopathy, trachea midline Chest: symmetric without deformity Cardiac: PMI non-displaced, RRR, normal S1, S2, no gallop or murmur Lung: clear to ausculation and percussion Vascular: Bilateral soft carotid bruits, decreased pulses in the lower extremities but present,  good bowel sounds, no HSM, no bruits Extremities: no cyanosis, clubbing or edema, no sign of DVT, no varicosities  Skin: normal color, no rashes Neuro: alert and oriented x 3, non-focal Pysch: normal affect  EKG  BMET    Component Value Date/Time   NA 141 03/08/2013 0610   NA 140 01/06/2013 0830   K 3.6 03/08/2013 0610   K 3.9 01/06/2013 0830   CL 101 03/08/2013 0610   CL 100 01/06/2013 0830   CO2 31 03/08/2013 0610   CO2 30* 01/06/2013 0830   GLUCOSE 139* 03/08/2013 0610   GLUCOSE 144* 01/06/2013 0830   BUN 14 03/08/2013 0610   BUN 23.1 01/06/2013 0830   CREATININE 1.13 03/08/2013 0610   CREATININE 1.3 01/06/2013 0830   CALCIUM 9.5 03/08/2013 0610   CALCIUM 9.5 01/06/2013 0830   GFRNONAA 66* 03/08/2013 0610   GFRAA 77* 03/08/2013 0610    Lipid Panel     Component Value Date/Time   CHOL  Value: 78        ATP III CLASSIFICATION:  <200     mg/dL   Desirable  119-147  mg/dL   Borderline High  >=829    mg/dL   High        02/01/2129 0414   TRIG 50 02/27/2010 0414   HDL 30* 02/27/2010 0414   CHOLHDL 2.6 02/27/2010 0414   VLDL 10 02/27/2010 0414   LDLCALC  Value: 38        Total Cholesterol/HDL:CHD Risk Coronary Heart Disease Risk Table                     Men   Women  1/2 Average Risk   3.4   3.3  Average Risk       5.0   4.4  2 X Average Risk   9.6   7.1  3 X Average Risk  23.4   11.0        Use the calculated Patient Ratio above and the CHD Risk Table to determine the patient's CHD Risk.        ATP III CLASSIFICATION (LDL):  <100     mg/dL   Optimal  161-096  mg/dL   Near or Above                     Optimal  130-159  mg/dL   Borderline  045-409  mg/dL   High  >811     mg/dL   Very High 05/30/4781 9562    CBC    Component Value Date/Time   WBC 5.8 03/08/2013 0610   WBC 8.1 01/06/2013 0830   RBC 4.98 03/08/2013 0610   RBC 4.94 01/06/2013 0830   HGB 15.2 03/08/2013 0610   HGB 14.7 01/06/2013 0830   HCT 44.2 03/08/2013 0610   HCT 43.8 01/06/2013 0830   PLT 187 03/08/2013 0610   PLT 211 01/06/2013 0830   MCV 88.8 03/08/2013 0610   MCV 88.7 01/06/2013 0830   MCH 30.5 03/08/2013 0610   MCH 29.8 01/06/2013 0830   MCHC 34.4 03/08/2013 0610   MCHC 33.6 01/06/2013 0830   RDW 14.1 03/08/2013 0610   RDW 13.7 01/06/2013 0830   LYMPHSABS 1.7 01/06/2013 0830   LYMPHSABS 1.2 07/28/2012 1623   MONOABS 0.8 01/06/2013 0830   MONOABS 0.8 07/28/2012 1623   EOSABS 0.2 01/06/2013 0830   EOSABS 0.1 07/28/2012 1623   BASOSABS 0.0 01/06/2013 0830   BASOSABS 0.0 07/28/2012 1623

## 2013-04-07 NOTE — Assessment & Plan Note (Signed)
If he develops acute cholecystitis again, he cannot be treated medically, he will have to have urgent surgery. He understands increased risk but this would have to be done to save his life. Hopefully, it will not recur.

## 2013-04-07 NOTE — Patient Instructions (Addendum)
Your physician has requested that you have a carotid duplex in November at [facility] This test is an ultrasound of the carotid arteries in your neck. It looks at blood flow through these arteries that supply the brain with blood. Allow one hour for this exam. There are no restrictions or special instructions.  Your physician wants you to follow-up in: 6 months with Dr. Darl Householder in [place].  You will receive a reminder letter in the mail two months in advance. If you don't receive a letter, please call our office to schedule the follow-up appointment.  Your physician recommends that you continue on your current medications as directed. Please refer to the Current Medication list given to you today.

## 2013-04-07 NOTE — Assessment & Plan Note (Signed)
Stable at present. Continue secondary preventative therapy. I'll have him followup in one year in [place].

## 2013-04-28 ENCOUNTER — Ambulatory Visit (HOSPITAL_BASED_OUTPATIENT_CLINIC_OR_DEPARTMENT_OTHER): Payer: Medicare Other

## 2013-04-28 VITALS — BP 130/75 | HR 80 | Temp 97.4°F

## 2013-04-28 DIAGNOSIS — E538 Deficiency of other specified B group vitamins: Secondary | ICD-10-CM

## 2013-04-28 DIAGNOSIS — D649 Anemia, unspecified: Secondary | ICD-10-CM

## 2013-04-28 MED ORDER — CYANOCOBALAMIN 1000 MCG/ML IJ SOLN
1000.0000 ug | Freq: Once | INTRAMUSCULAR | Status: AC
  Administered 2013-04-28: 1000 ug via INTRAMUSCULAR

## 2013-05-02 ENCOUNTER — Other Ambulatory Visit: Payer: Self-pay | Admitting: *Deleted

## 2013-05-02 MED ORDER — CLOPIDOGREL BISULFATE 75 MG PO TABS: 75.0000 mg | ORAL_TABLET | Freq: Every day | ORAL | Status: DC

## 2013-05-02 MED ORDER — FUROSEMIDE 40 MG PO TABS: ORAL_TABLET | ORAL | Status: DC

## 2013-05-11 ENCOUNTER — Ambulatory Visit (INDEPENDENT_AMBULATORY_CARE_PROVIDER_SITE_OTHER): Payer: Medicare Other | Admitting: *Deleted

## 2013-05-11 DIAGNOSIS — I2589 Other forms of chronic ischemic heart disease: Secondary | ICD-10-CM

## 2013-05-11 LAB — ICD DEVICE OBSERVATION
AL AMPLITUDE: 2.7 mv
AL IMPEDENCE ICD: 350 Ohm
ATRIAL PACING ICD: 83 pct
BAMS-0001: 150 {beats}/min
BAMS-0003: 70 {beats}/min
DEV-0020ICD: NEGATIVE
MODE SWITCH EPISODES: 3
RV LEAD AMPLITUDE: 11.7 mv
RV LEAD IMPEDENCE ICD: 312.5 Ohm
RV LEAD THRESHOLD: 1 V
TOT-0007: 1
TOT-0008: 0
VENTRICULAR PACING ICD: 0.68 pct
VF: 0

## 2013-05-11 NOTE — Progress Notes (Signed)
ICD check in office. 

## 2013-05-26 ENCOUNTER — Ambulatory Visit (HOSPITAL_BASED_OUTPATIENT_CLINIC_OR_DEPARTMENT_OTHER): Payer: Medicare Other

## 2013-05-26 VITALS — BP 123/59 | HR 77 | Temp 97.4°F

## 2013-05-26 DIAGNOSIS — E538 Deficiency of other specified B group vitamins: Secondary | ICD-10-CM

## 2013-05-26 DIAGNOSIS — D649 Anemia, unspecified: Secondary | ICD-10-CM

## 2013-05-26 MED ORDER — CYANOCOBALAMIN 1000 MCG/ML IJ SOLN
1000.0000 ug | Freq: Once | INTRAMUSCULAR | Status: AC
  Administered 2013-05-26: 1000 ug via INTRAMUSCULAR

## 2013-05-31 ENCOUNTER — Encounter: Payer: Self-pay | Admitting: Internal Medicine

## 2013-06-20 ENCOUNTER — Telehealth: Payer: Self-pay | Admitting: *Deleted

## 2013-06-20 NOTE — Telephone Encounter (Signed)
Most we would recommend is Extra strength tylenol every 6 hours. He will need to see his PCP for something stronger if this is not helpful to him.

## 2013-06-20 NOTE — Telephone Encounter (Signed)
Please advise cardiac approved pain reliever for pt

## 2013-06-20 NOTE — Telephone Encounter (Signed)
Pt states that Dr Daleen Squibb told him he was not cleared to have knee surgery. Pt states he is in a lot of pain and needs to know if there is anything he can do to help the pain.

## 2013-06-21 ENCOUNTER — Other Ambulatory Visit: Payer: Self-pay | Admitting: Cardiology

## 2013-06-21 NOTE — Telephone Encounter (Signed)
Spoke to pt to advise results/instructions. Pt understood. Pt advised his PCP has given him Oxycodone but he does not like the way it makes him feel plus he does not see much relief with any medciation that the PCP has prescribed, the pt wanted to see if there is anyway he could now be cleared for surgery, this nurse advised I can send the information to Dr. Purvis Sheffield to review to see if any instructions could be given for further assessment of clearance however this would not be guaranteed per pt refusal for clearance in the past, pt understood that we will contact him by the end of the business day, noted OV with Dr Daleen Squibb on 04-07-2013-notations from Dr Daleen Squibb as follows:  HPI  Mr. Spong comes in today for evaluation and management of his complex coronary and vascular disease. He was admitted to the [facility] with acute cholecystitis in June. Because of his high risk for surgery, they were able to cool him off with antibiotics and observation. He was discharged home but told this may occur again.  I have advised him not to have any elective knee surgery in the past. Our cardiologist team saw him in [place] and cleared him if it absolutely had to be done. I've explained this thoroughly to him today.      Please advise if pt can be re-scheduled for possibility of surgical clearance

## 2013-06-22 NOTE — Telephone Encounter (Signed)
Would have him scheduled to see one of the physicians for an office visit to determine how to optimally risk stratify prior to surgery.

## 2013-06-22 NOTE — Telephone Encounter (Signed)
Scheduled pt to be seen by Dr. Purvis Sheffield 07-12-13 at 10am to re-evaluate for knee surgery, pt accepted apt

## 2013-06-23 ENCOUNTER — Ambulatory Visit: Payer: Medicare Other

## 2013-06-23 ENCOUNTER — Other Ambulatory Visit: Payer: Medicare Other | Admitting: Lab

## 2013-06-30 ENCOUNTER — Telehealth: Payer: Self-pay | Admitting: Internal Medicine

## 2013-06-30 NOTE — Telephone Encounter (Signed)
Pt called today to r/s 9/25 lb.fu/inj - pt ftka. Next available is 10/15 and per pt request inj scheduled for 10/3 and lb/fu 10/21. Due to pt ftka 9/25 other inj appts are now out of sync and were cancelled. Pt aware he will get new inj orders/appts at 10/21 f/u. Pt has next appts for inj 10/3 and lb.fu 10/21.

## 2013-07-01 ENCOUNTER — Ambulatory Visit (HOSPITAL_BASED_OUTPATIENT_CLINIC_OR_DEPARTMENT_OTHER): Payer: Medicare Other

## 2013-07-01 VITALS — BP 143/70 | HR 69 | Temp 97.1°F

## 2013-07-01 DIAGNOSIS — E538 Deficiency of other specified B group vitamins: Secondary | ICD-10-CM

## 2013-07-01 DIAGNOSIS — D649 Anemia, unspecified: Secondary | ICD-10-CM

## 2013-07-01 MED ORDER — CYANOCOBALAMIN 1000 MCG/ML IJ SOLN
1000.0000 ug | Freq: Once | INTRAMUSCULAR | Status: AC
  Administered 2013-07-01: 1000 ug via INTRAMUSCULAR

## 2013-07-12 ENCOUNTER — Ambulatory Visit (INDEPENDENT_AMBULATORY_CARE_PROVIDER_SITE_OTHER): Payer: Medicare Other | Admitting: Cardiovascular Disease

## 2013-07-12 ENCOUNTER — Encounter: Payer: Self-pay | Admitting: Cardiovascular Disease

## 2013-07-12 VITALS — BP 139/86 | HR 72 | Ht 74.0 in | Wt 203.0 lb

## 2013-07-12 DIAGNOSIS — I679 Cerebrovascular disease, unspecified: Secondary | ICD-10-CM

## 2013-07-12 DIAGNOSIS — I10 Essential (primary) hypertension: Secondary | ICD-10-CM

## 2013-07-12 DIAGNOSIS — N189 Chronic kidney disease, unspecified: Secondary | ICD-10-CM

## 2013-07-12 DIAGNOSIS — E785 Hyperlipidemia, unspecified: Secondary | ICD-10-CM

## 2013-07-12 DIAGNOSIS — Z95810 Presence of automatic (implantable) cardiac defibrillator: Secondary | ICD-10-CM

## 2013-07-12 DIAGNOSIS — I4891 Unspecified atrial fibrillation: Secondary | ICD-10-CM

## 2013-07-12 DIAGNOSIS — I5022 Chronic systolic (congestive) heart failure: Secondary | ICD-10-CM

## 2013-07-12 DIAGNOSIS — Z951 Presence of aortocoronary bypass graft: Secondary | ICD-10-CM

## 2013-07-12 DIAGNOSIS — I739 Peripheral vascular disease, unspecified: Secondary | ICD-10-CM

## 2013-07-12 DIAGNOSIS — I252 Old myocardial infarction: Secondary | ICD-10-CM

## 2013-07-12 DIAGNOSIS — Z01810 Encounter for preprocedural cardiovascular examination: Secondary | ICD-10-CM

## 2013-07-12 MED ORDER — NITROGLYCERIN 0.4 MG SL SUBL: 0.4000 mg | SUBLINGUAL_TABLET | SUBLINGUAL | Status: DC | PRN

## 2013-07-12 NOTE — Progress Notes (Signed)
Patient ID: [name], male   DOB: Apr 08, 1946, 67 y.o.   MRN: [id number]      SUBJECTIVE: [name] has a h/o CAD s/p CABG, chronic systolic heart failure s/p ICD, CKD, diabetes mellitus, HTN, hyperlipidemia, GI bleeds, paroxysmal atrial fibrillation (deemed to be a poor anticoagulation candidate), anemia, CVA, and carotid artery disease (s/p right CEA). His most recent nuclear myocardial perfusion study was on 07/29/2012 and revealed the following:  Final interpretation: Abnormal Lexiscan Myoview with chest pain but no diagnostic electrocardiographic changes. The scintigraphic results show extensive prior distal anteroseptal, apical and inferior infarcts. No ischemia noted. The gated ejection fraction was 37% and there is apical and inferior akinesis and septal hypokinesis. There is evidence of left ventricular enlargement.  He was admitted to [facility] with acute cholecystitis in June which was treated medically due to his high risk for surgery. He has been advised against having elective knee replacement in the past.  He tells me he used to walk 3-4 miles daily up to a year ago without difficulty, denying chest pain and dyspnea as well as syncope at that time. However, due to severe right knee pain, he has been unable to walk which has progressively worsened over the past year. He uses a walker at home to walk, and is very frustrated. He is intent on undergoing knee replacement surgery.   At present, he denies chest pain, dyspnea, dizziness, lightheadedness, and syncope, all with minimal exertion. He also denies orthopnea, PND, and seldom has ankle swelling, which is alleviated with leg elevation.   Allergies  Allergen Reactions  . Codeine     REACTION: G I  distress    Current Outpatient Prescriptions  Medication Sig Dispense Refill  . aspirin EC 81 MG EC tablet Take 1 tablet (81 mg total) by mouth daily.      [name] atorvastatin (LIPITOR) 80 MG tablet Take 40 mg by mouth every  other day.       . baclofen (LIORESAL) 20 MG tablet Take 40 mg by mouth at bedtime.       . carvedilol (COREG) 12.5 MG tablet Take 12.5 mg by mouth 2 (two) times daily.      . clopidogrel (PLAVIX) 75 MG tablet Take 1 tablet (75 mg total) by mouth daily.  30 tablet  5  . furosemide (LASIX) 40 MG tablet Take 20 mg by mouth daily. TAKE ONE TABLET BY MOUTH AS DIRECTED      . glimepiride (AMARYL) 4 MG tablet Take 4 mg by mouth 2 (two) times daily.       [name] linagliptin (TRADJENTA) 5 MG TABS tablet Take 5 mg by mouth daily.      . meclizine (ANTIVERT) 25 MG tablet Take 25 mg by mouth 4 (four) times daily as needed. For dizziness      . nitroGLYCERIN (NITROSTAT) 0.4 MG SL tablet Place 1 tablet (0.4 mg total) under the tongue every 5 (five) minutes as needed for chest pain.  25 tablet  4  . spironolactone (ALDACTONE) 25 MG tablet TAKE ONE TABLET BY MOUTH EVERY DAY IN THE MORNING  30 tablet  6  . traMADol (ULTRAM) 50 MG tablet Take 50 mg by mouth 3 (three) times daily. For pain       No current facility-administered medications for this visit.    Past Medical History  Diagnosis Date  . Sinus bradycardia   . Tobacco abuse   . Chronic renal insufficiency   . Hypertension   . Osteoarthritis   .  Diabetes mellitus   . Anemia     Multifactorial  - hx of B12 def and iron deficiency, followed by heme  . Mitral regurgitation   . Systolic CHF, chronic     Previously taken off lisinopril by primary doctor due to labs  . Carotid stenosis     a. Prior carotid dz/ surgery. b. Carotid dopp 07/2011 - 0-39% bilaterally.;   c. Doppler 11/13: 40-59% RICA, 0-39% LICA  . Cardiomyopathy, ischemic     a. Chronic systolic CHF s/p St. Jude dual chamber ICD 03/2008.  [name] Hx of CABG   . CAD (coronary artery disease)     a. STEMI s/p CABG 07/2007 (had preop cardiogenic shock, IABP, VDRF before surgery)  . Stroke     a. CVA 1990s - chronic pain in RUE after stroke.  [name] PAF (paroxysmal atrial fibrillation)     a. Poor  Coumadin candidate due to hx of GI bleed.  . GI bleed     Reported history  . Splenomegaly     Past Surgical History  Procedure Laterality Date  . Coronary artery bypass graft    . Percutaneous coronary intervention  01/10/2005    [name] Constable, MD  . Coronary artery bypass graft  08/29/2007    x3; [name] Decent. Cornelius Moras MD  . Cardiac defibrillator placement  04/05/2008    Implantation of a St. Jude dual-chamber defibrillator, [name] Canning. [name] , MD  . Cardiac defibrillator placement      History   Social History  . Marital Status: Married    Spouse Name: N/A    Number of Children: N/A  . Years of Education: N/A   Occupational History  . Retired    Social History Main Topics  . Smoking status: Former Smoker -- 1.00 packs/day for 15 years    Quit date: 09/29/1990  . Smokeless tobacco: Never Used  . Alcohol Use: No  . Drug Use: No  . Sexual Activity: Not on file   Other Topics Concern  . Not on file   Social History Narrative   Lives in [place] with wife   Been on disability since his stroke in the 1990s   Not routinely exercising     Filed Vitals:   07/12/13 1056  BP: 139/86  Pulse: 72  Height: 6\' 2"  (1.88 m)  Weight: 203 lb (92.08 kg)  SpO2: 97%    PHYSICAL EXAM General: NAD Neck: No JVD, no thyromegaly or thyroid nodule.  Lungs: Clear to auscultation bilaterally with normal respiratory effort. CV: Nondisplaced PMI.  Heart regular S1/S2, no S3/S4, no murmur.  No peripheral edema.  No carotid bruit.  Normal pedal pulses.  Abdomen: Soft, nontender, no hepatosplenomegaly, no distention.  Neurologic: Alert and oriented x 3.  Psych: Normal affect. Extremities: No clubbing or cyanosis. Right knee swelling.  ECG: reviewed and available in electronic records. (NSR, 60 bpm, old inferior and anterolateral infarct).  Cardiac Cath 03/2008  CONCLUSIONS:  1. Moderate reduction in left ventricular function, but improved from  the previous acute study prior to  revascularization surgery.  2. Continued patency of the internal mammary to the left anterior  descending.  3. Continued patency of saphenous vein graft to the obtuse marginal.  4. Continued patency of saphenous vein graft to the posterior  descending.  5. Scattered disease involving the intermediate and the  atrioventricular circumflex distally with moderate stenosis, but  small-to-moderate-sized vessels.   Echocardiogram 2009  Septal and apical akinesis, anterior hypokinesis The left ventricle was  moderately dilated. Overall left ventricular systolic function was moderately to markedly decreased. Left ventricular ejection fraction was estimated to be 35 %. Left ventricular wall thickness was mildly increased. - The aortic valve was mildly calcified. - There was mild mitral valvular regurgitation. - The left atrium was mildly dilated.     ASSESSMENT AND PLAN: 1. Preoperative risk stratification: the patient has complex cardiovascular disease and is at moderate to high risk for a perioperative major adverse cardiac event. He is fully aware of this. He is on good medical therapy to mitigate this risk as much as possible, which includes ASA, Plavix, Lipitor, Coreg, Lasix, and Aldactone. A stress test done nearly a year ago revealed no evidence of ischemia, but extensive myocardial scar. I will obtain an echocardiogram to assess his LV systolic function. I explained the risks of undergoing an elective knee replacement surgery, including MI, CVA, and death, and he is aware of these risks but would like to proceed. If he is able to successfully undergo knee replacement, he would be able to walk again which would be better for his overall cardiovascular health.  2. CAD s/p CABG: see #1. Currently asymptomatic with minimal exertion, and on adequate medical therapy. Would consider the addition of ACEI and this has been recommended in the past. It was apparently discontinued by the patient's PCP for  renal insufficiency.  3. Chronic systolic heart failure: will repeat echocardiogram. Currently compensated with no changes necessary to his diuretic regimen.  4. ICD: functioning normally on 05-11-2013.  5. HTN: relatively well controlled.  6. Carotid artery disease: Dopplers have been ordered for surveillance in 03/2013.  7. Hyperlipidemia: on high dose atorvastatin.  8. Paroxysmal atrial fibrillation: currently in normal sinus rhythm, and deemed to be a poor candidate for anticoagulation due to h/o GI bleeds.    Prentice Docker, M.D., F.A.C.C.

## 2013-07-12 NOTE — Patient Instructions (Addendum)
Your physician recommends that you schedule a follow-up appointment in: 3 months  Your physician has requested that you have an echocardiogram. Echocardiography is a painless test that uses sound waves to create images of your heart. It provides your doctor with information about the size and shape of your heart and how well your heart's chambers and valves are working. This procedure takes approximately one hour. There are no restrictions for this procedure.  WE WILL CALL YOU WITH YOUR TEST RESULTS/INSTRUCTIONS/NEXT STEPS ONCE RECEIVED BY THE PROVIDER   

## 2013-07-15 ENCOUNTER — Ambulatory Visit (HOSPITAL_COMMUNITY)
Admission: RE | Admit: 2013-07-15 | Discharge: 2013-07-15 | Disposition: A | Discharge status: Home / Self Care | Payer: Medicare Other | Source: Ambulatory Visit | Attending: Cardiovascular Disease | Admitting: Cardiovascular Disease

## 2013-07-15 DIAGNOSIS — Z951 Presence of aortocoronary bypass graft: Secondary | ICD-10-CM

## 2013-07-15 DIAGNOSIS — I2510 Atherosclerotic heart disease of native coronary artery without angina pectoris: Secondary | ICD-10-CM | POA: Insufficient documentation

## 2013-07-15 DIAGNOSIS — I10 Essential (primary) hypertension: Secondary | ICD-10-CM | POA: Insufficient documentation

## 2013-07-15 DIAGNOSIS — Z01810 Encounter for preprocedural cardiovascular examination: Secondary | ICD-10-CM

## 2013-07-15 DIAGNOSIS — I679 Cerebrovascular disease, unspecified: Secondary | ICD-10-CM

## 2013-07-15 DIAGNOSIS — E119 Type 2 diabetes mellitus without complications: Secondary | ICD-10-CM | POA: Insufficient documentation

## 2013-07-15 DIAGNOSIS — I4891 Unspecified atrial fibrillation: Secondary | ICD-10-CM | POA: Insufficient documentation

## 2013-07-15 NOTE — Progress Notes (Signed)
*  PRELIMINARY RESULTS* Echocardiogram 2D Echocardiogram has been performed.  [name] 07/15/2013, 2:30 PM

## 2013-07-18 ENCOUNTER — Other Ambulatory Visit: Payer: Self-pay | Admitting: Medical Oncology

## 2013-07-18 DIAGNOSIS — D509 Iron deficiency anemia, unspecified: Secondary | ICD-10-CM

## 2013-07-19 ENCOUNTER — Telehealth: Payer: Self-pay | Admitting: Internal Medicine

## 2013-07-19 ENCOUNTER — Other Ambulatory Visit (HOSPITAL_BASED_OUTPATIENT_CLINIC_OR_DEPARTMENT_OTHER): Payer: Medicare Other | Admitting: Lab

## 2013-07-19 ENCOUNTER — Ambulatory Visit (HOSPITAL_BASED_OUTPATIENT_CLINIC_OR_DEPARTMENT_OTHER): Payer: Medicare Other | Admitting: Internal Medicine

## 2013-07-19 VITALS — BP 139/75 | HR 82 | Temp 97.5°F | Resp 20 | Ht 74.0 in | Wt 201.1 lb

## 2013-07-19 DIAGNOSIS — E538 Deficiency of other specified B group vitamins: Secondary | ICD-10-CM

## 2013-07-19 DIAGNOSIS — D509 Iron deficiency anemia, unspecified: Secondary | ICD-10-CM

## 2013-07-19 DIAGNOSIS — E119 Type 2 diabetes mellitus without complications: Secondary | ICD-10-CM

## 2013-07-19 DIAGNOSIS — N189 Chronic kidney disease, unspecified: Secondary | ICD-10-CM

## 2013-07-19 DIAGNOSIS — D649 Anemia, unspecified: Secondary | ICD-10-CM

## 2013-07-19 LAB — COMPREHENSIVE METABOLIC PANEL (CC13)
ALT: 14 U/L (ref 0–55)
AST: 17 U/L (ref 5–34)
Albumin: 3.7 g/dL (ref 3.5–5.0)
Alkaline Phosphatase: 99 U/L (ref 40–150)
Calcium: 9.4 mg/dL (ref 8.4–10.4)
Chloride: 104 mEq/L (ref 98–109)
Potassium: 4 mEq/L (ref 3.5–5.1)
Sodium: 140 mEq/L (ref 136–145)
Total Protein: 7.3 g/dL (ref 6.4–8.3)

## 2013-07-19 LAB — CBC WITH DIFFERENTIAL/PLATELET
BASO%: 0.4 % (ref 0.0–2.0)
EOS%: 2.5 % (ref 0.0–7.0)
HGB: 15.6 g/dL (ref 13.0–17.1)
MCH: 30.2 pg (ref 27.2–33.4)
MCV: 90.7 fL (ref 79.3–98.0)
MONO%: 10.6 % (ref 0.0–14.0)
RBC: 5.18 10*6/uL (ref 4.20–5.82)
RDW: 14.8 % — ABNORMAL HIGH (ref 11.0–14.6)
lymph#: 1.7 10*3/uL (ref 0.9–3.3)

## 2013-07-19 LAB — FERRITIN CHCC: Ferritin: 36 ng/ml (ref 22–316)

## 2013-07-19 LAB — VITAMIN B12: Vitamin B-12: 522 pg/mL (ref 211–911)

## 2013-07-19 LAB — IRON AND TIBC CHCC: TIBC: 361 ug/dL (ref 202–409)

## 2013-07-19 MED ORDER — CYANOCOBALAMIN 1000 MCG/ML IJ SOLN
INTRAMUSCULAR | Status: AC
  Filled 2013-07-19: qty 1

## 2013-07-19 MED ORDER — CYANOCOBALAMIN 1000 MCG/ML IJ SOLN
1000.0000 ug | Freq: Once | INTRAMUSCULAR | Status: AC
  Administered 2013-07-19: 1000 ug via INTRAMUSCULAR

## 2013-07-19 NOTE — Telephone Encounter (Signed)
gv adn printed appt sched and avs for pt for NOV thru April 2015

## 2013-07-20 NOTE — Progress Notes (Signed)
[facility] OFFICE PROGRESS NOTE  BUTLER, CYNTHIA, DO [address] [place] [address]  DIAGNOSIS: ANEMIA, IRON DEFICIENCY - Plan: CBC with Differential, Comprehensive metabolic panel, Ferritin, Iron and TIBC  ANEMIA - Plan: SCHEDULING COMMUNICATION INJECTION, cyanocobalamin ((VITAMIN B-12)) injection 1,000 mcg  Chief Complaint  Patient presents with  . Anemia    CURRENT THERAPY: Monthly Vitamin B-12 1000 mcg IM injection. These injections were restarted on 08/05/2012.  INTERVAL HISTORY: [name] 67 y.o. male with a history of multiple co-morbidities including CAD s/p CABG, chronic systolic heart failure s/p ICD, CKD, diabetes mellitus, HTN, hyperlipidemia, GI bleeds, paroxysmal atrial fibrillation (deemed to be a poor anticoagulation candidate), CVA s/p righted side weakness, and carotid artery disease (s/p right CEA) is here for follow-up for his anemia.  He was last seen by Dr. Arline Asp on 01/06/2013.  Today, he is without complaints.  He denies chest pain, fever or chills or acute shortness of breath.  He recently received the flu shot.    MEDICAL HISTORY: Past Medical History  Diagnosis Date  . Sinus bradycardia   . Tobacco abuse   . Chronic renal insufficiency   . Hypertension   . Osteoarthritis   . Diabetes mellitus   . Anemia     Multifactorial  - hx of B12 def and iron deficiency, followed by heme  . Mitral regurgitation   . Systolic CHF, chronic     Previously taken off lisinopril by primary doctor due to labs  . Carotid stenosis     a. Prior carotid dz/ surgery. b. Carotid dopp 07/2011 - 0-39% bilaterally.;   c. Doppler 11/13: 40-59% RICA, 0-39% LICA  . Cardiomyopathy, ischemic     a. Chronic systolic CHF s/p St. Jude dual chamber ICD 03/2008.  [name] Hx of CABG   . CAD (coronary artery disease)     a. STEMI s/p CABG 07/2007 (had preop cardiogenic shock, IABP, VDRF before surgery)  . Stroke     a. CVA 1990s - chronic pain in RUE after  stroke.  [name] PAF (paroxysmal atrial fibrillation)     a. Poor Coumadin candidate due to hx of GI bleed.  . GI bleed     Reported history  . Splenomegaly    INTERIM HISTORY: has DM (diabetes mellitus), secondary, with peripheral vascular complications; ANEMIA, IRON DEFICIENCY; HYPERTENSION; CARDIOMYOPATHY, ISCHEMIC; CEREBROVASCULAR DISEASE; PVD; RENAL INSUFFICIENCY, CHRONIC; OSTEOARTHRITIS; AUTOMATIC IMPLANTABLE CARDIAC DEFIBRILLATOR SITU; CORONARY ARTERY BYPASS GRAFT, HX OF; and Cholecystitis with cholelithiasis on his problem list.    ALLERGIES:  is allergic to codeine.  MEDICATIONS: has a current medication list which includes the following prescription(s): aspirin, atorvastatin, baclofen, carvedilol, clopidogrel, furosemide, glimepiride, linagliptin, meclizine, nitroglycerin, spironolactone, and tramadol.  SURGICAL HISTORY:  Past Surgical History  Procedure Laterality Date  . Coronary artery bypass graft    . Percutaneous coronary intervention  01/10/2005    Charlies Constable, MD  . Coronary artery bypass graft  08/29/2007    x3; Salvatore Decent. Cornelius Moras MD  . Cardiac defibrillator placement  04/05/2008    Implantation of a St. Jude dual-chamber defibrillator, Doylene Canning. Ladona Ridgel , MD  . Cardiac defibrillator placement     PROBLEM LIST:  1. History of iron-deficiency anemia.  2. History of vitamin B12 deficiency, currently on monthly vitamin B12 shots after patient expressed intolerance to oral vitamin B12.  3. Renal insufficiency.  4. Severe 3-vessel coronary artery disease status post coronary artery bypass graft on 08/29/2007. History of myocardial infarction  08/11/2007. Right dual  chamber implantable cardioverter  defibrillator placed 04/05/2008.  5. Ischemic cardiomyopathy.  6. Hypertension.  7. Dyslipidemia.  8. Diabetes mellitus type 2.  9. 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 The patient is status post left carotid  endarterectomy.  10. Left  total knee replacement at age 60.  11. History of splenomegaly seen on a CT scan from 08/14/2007 and 08/25/2007.   REVIEW OF SYSTEMS:   Constitutional: Denies fevers, chills or abnormal weight loss Eyes: Denies blurriness of vision Ears, nose, mouth, throat, and face: Denies mucositis or sore throat Respiratory: Denies cough, dyspnea or wheezes Cardiovascular: Denies palpitation, chest discomfort or lower extremity swelling Gastrointestinal:  Denies nausea, heartburn or change in bowel habits Skin: Denies abnormal skin rashes Lymphatics: Denies new lymphadenopathy or easy bruising Neurological:Denies numbness, tingling or new weaknesses Behavioral/Psych: Mood is stable, no new changes  All other systems were reviewed with the patient and are negative.  PHYSICAL EXAMINATION: ECOG PERFORMANCE STATUS: 1 - Symptomatic but completely ambulatory  Blood pressure 139/75, pulse 82, temperature 97.5 F (36.4 C), temperature source Oral, resp. rate 20, height 6\' 2"  (1.88 m), weight 201 lb 1.6 oz (91.218 kg).  GENERAL:alert, no distress and comfortable;  SKIN: skin color, texture, turgor are normal, no rashes or significant lesions EYES: normal, Conjunctiva are pink and non-injected, sclera clear OROPHARYNX:no exudate, no erythema and lips, buccal mucosa, and tongue normal ; poor dentition with many missing teeth.  NECK: supple, thyroid normal size, non-tender, without nodularity; Scars well healed on right neck area.   LYMPH:  no palpable lymphadenopathy in the cervical, axillary or supraclavicular LUNGS: clear to auscultation and percussion with normal breathing effort HEART: regular rate & rhythm and no murmurs and no lower extremity edema;+ Implanted ICD on the right infraclavicular area.   ABDOMEN:abdomen soft, non-tender and normal bowel sounds Musculoskeletal:no cyanosis of digits and no clubbing  NEURO: alert & oriented x 3 with fluent speech, R side weakness with a cautious gait.     LABORATORY DATA: Results for orders placed in visit on 07/19/13 (from the past 48 hour(s))  CBC WITH DIFFERENTIAL     Status: Abnormal   Collection Time    07/19/13  1:41 PM      Result Value Range   WBC 6.8  4.0 - 10.3 10e3/uL   NEUT# 4.2  1.5 - 6.5 10e3/uL   HGB 15.6  13.0 - 17.1 g/dL   HCT 16.1  09.6 - 04.5 %   Platelets 182  140 - 400 10e3/uL   MCV 90.7  79.3 - 98.0 fL   MCH 30.2  27.2 - 33.4 pg   MCHC 33.3  32.0 - 36.0 g/dL   RBC 4.09  8.11 - 9.14 10e6/uL   RDW 14.8 (*) 11.0 - 14.6 %   lymph# 1.7  0.9 - 3.3 10e3/uL   MONO# 0.7  0.1 - 0.9 10e3/uL   Eosinophils Absolute 0.2  0.0 - 0.5 10e3/uL   Basophils Absolute 0.0  0.0 - 0.1 10e3/uL   NEUT% 61.6  39.0 - 75.0 %   LYMPH% 24.9  14.0 - 49.0 %   MONO% 10.6  0.0 - 14.0 %   EOS% 2.5  0.0 - 7.0 %   BASO% 0.4  0.0 - 2.0 %  IRON AND TIBC [facility]     Status: None   Collection Time    07/19/13  1:41 PM      Result Value Range   Iron 79  42 -  163 ug/dL   TIBC 161  096 - 045 ug/dL   UIBC 409  811 - 914 ug/dL   %SAT 22  20 - 55 %  FERRITIN [facility]     Status: None   Collection Time    07/19/13  1:41 PM      Result Value Range   Ferritin 36  22 - 316 ng/ml  COMPREHENSIVE METABOLIC PANEL (CC13)     Status: Abnormal   Collection Time    07/19/13  1:41 PM      Result Value Range   Sodium 140  136 - 145 mEq/L   Potassium 4.0  3.5 - 5.1 mEq/L   Chloride 104  98 - 109 mEq/L   CO2 25  22 - 29 mEq/L   Glucose 202 (*) 70 - 140 mg/dl   BUN 78.2  7.0 - 95.6 mg/dL   Creatinine 1.1  0.7 - 1.3 mg/dL   Total Bilirubin 2.13  0.20 - 1.20 mg/dL   Alkaline Phosphatase 99  40 - 150 U/L   AST 17  5 - 34 U/L   ALT 14  0 - 55 U/L   Total Protein 7.3  6.4 - 8.3 g/dL   Albumin 3.7  3.5 - 5.0 g/dL   Calcium 9.4  8.4 - 08.6 mg/dL   Anion Gap 11  3 - 11 mEq/L  VITAMIN B12     Status: None   Collection Time    07/19/13  1:41 PM      Result Value Range   Vitamin B-12 522  211 - 911 pg/mL    Labs:  Lab Results  Component Value Date   WBC 6.8  07/19/2013   HGB 15.6 07/19/2013   HCT 46.9 07/19/2013   MCV 90.7 07/19/2013   PLT 182 07/19/2013   NEUTROABS 4.2 07/19/2013      Chemistry      Component Value Date/Time   NA 140 07/19/2013 1341   NA 141 03/08/2013 0610   K 4.0 07/19/2013 1341   K 3.6 03/08/2013 0610   CL 101 03/08/2013 0610   CL 100 01/06/2013 0830   CO2 25 07/19/2013 1341   CO2 31 03/08/2013 0610   BUN 13.6 07/19/2013 1341   BUN 14 03/08/2013 0610   CREATININE 1.1 07/19/2013 1341   CREATININE 1.13 03/08/2013 0610      Component Value Date/Time   CALCIUM 9.4 07/19/2013 1341   CALCIUM 9.5 03/08/2013 0610   ALKPHOS 99 07/19/2013 1341   ALKPHOS 246* 03/08/2013 0610   AST 17 07/19/2013 1341   AST 80* 03/08/2013 0610   ALT 14 07/19/2013 1341   ALT 185* 03/08/2013 0610   BILITOT 0.59 07/19/2013 1341   BILITOT 1.1 03/08/2013 0610     Basic Metabolic Panel:  Recent Labs Lab 07/19/13 1341  NA 140  K 4.0  CO2 25  GLUCOSE 202*  BUN 13.6  CREATININE 1.1  CALCIUM 9.4   GFR Estimated Creatinine Clearance: 76.8 ml/min (by C-G formula based on Cr of 1.1). Liver Function Tests:  Recent Labs Lab 07/19/13 1341  AST 17  ALT 14  ALKPHOS 99  BILITOT 0.59  PROT 7.3  ALBUMIN 3.7   CBC:  Recent Labs Lab 07/19/13 1341  WBC 6.8  NEUTROABS 4.2  HGB 15.6  HCT 46.9  MCV 90.7  PLT 182   Anemia work up  Recent Labs  07/19/13 1341 07/19/13 1341  VITAMINB12  --  522  FERRITIN 36  --   TIBC  361  --   IRON 79  --    Studies:  No results found.   RADIOGRAPHIC STUDIES: No results found.  ASSESSMENT: [name] 67 y.o. male with a history of ANEMIA, IRON DEFICIENCY - Plan: CBC with Differential, Comprehensive metabolic panel, Ferritin, Iron and TIBC  ANEMIA - Plan: SCHEDULING COMMUNICATION INJECTION, cyanocobalamin ((VITAMIN B-12)) injection 1,000 mcg   PLAN:  1. Anemia. --He continues to well from a hematological standpoint.  He comes here for a monthly vitamin B12 shot and will receive one  today.  His iron studies appear to be holding ok.  We will plan to see Mr. Ryser again in 6 months at time we will check his CBC, chemistries, vitamin B12 level and iron studies.   All questions were answered. The patient knows to call the clinic with any problems, questions or concerns. We can certainly see the patient much sooner if necessary.  I spent 10 minutes counseling the patient face to face. The total time spent in the appointment was 15 minutes.    Khadejah Son, MD 07/20/2013 9:26 PM

## 2013-07-21 ENCOUNTER — Ambulatory Visit: Payer: Medicare Other

## 2013-07-25 ENCOUNTER — Other Ambulatory Visit (HOSPITAL_COMMUNITY): Payer: Self-pay | Admitting: Orthopaedic Surgery

## 2013-07-25 ENCOUNTER — Other Ambulatory Visit: Payer: Self-pay | Admitting: Cardiology

## 2013-08-04 ENCOUNTER — Encounter (HOSPITAL_COMMUNITY): Payer: Self-pay | Admitting: Pharmacy Technician

## 2013-08-09 ENCOUNTER — Encounter (HOSPITAL_COMMUNITY): Payer: Self-pay | Admitting: Pharmacy Technician

## 2013-08-09 ENCOUNTER — Encounter (HOSPITAL_COMMUNITY): Payer: Self-pay

## 2013-08-09 ENCOUNTER — Encounter (HOSPITAL_COMMUNITY)
Admission: RE | Admit: 2013-08-09 | Discharge: 2013-08-09 | Disposition: A | Discharge status: Home / Self Care | Payer: Medicare Other | Source: Ambulatory Visit | Attending: Orthopaedic Surgery | Admitting: Orthopaedic Surgery

## 2013-08-09 ENCOUNTER — Encounter (HOSPITAL_COMMUNITY)
Admission: RE | Admit: 2013-08-09 | Discharge: 2013-08-09 | Disposition: A | Discharge status: Home / Self Care | Payer: Medicare Other | Source: Ambulatory Visit | Attending: Anesthesiology | Admitting: Anesthesiology

## 2013-08-09 DIAGNOSIS — Z01818 Encounter for other preprocedural examination: Secondary | ICD-10-CM | POA: Insufficient documentation

## 2013-08-09 DIAGNOSIS — Z01812 Encounter for preprocedural laboratory examination: Secondary | ICD-10-CM | POA: Insufficient documentation

## 2013-08-09 HISTORY — DX: Presence of automatic (implantable) cardiac defibrillator: Z95.810

## 2013-08-09 HISTORY — DX: Sleep apnea, unspecified: G47.30

## 2013-08-09 HISTORY — DX: Acute myocardial infarction, unspecified: I21.9

## 2013-08-09 HISTORY — DX: Myoneural disorder, unspecified: G70.9

## 2013-08-09 LAB — BASIC METABOLIC PANEL
BUN: 17 mg/dL (ref 6–23)
CO2: 26 mEq/L (ref 19–32)
Calcium: 9 mg/dL (ref 8.4–10.5)
Creatinine, Ser: 1.15 mg/dL (ref 0.50–1.35)
GFR calc non Af Amer: 65 mL/min — ABNORMAL LOW (ref >90)
Glucose, Bld: 149 mg/dL — ABNORMAL HIGH (ref 70–99)

## 2013-08-09 LAB — URINE MICROSCOPIC-ADD ON

## 2013-08-09 LAB — PROTIME-INR
INR: 0.96 (ref 0.00–1.49)
Prothrombin Time: 12.6 seconds (ref 11.6–15.2)

## 2013-08-09 LAB — URINALYSIS, ROUTINE W REFLEX MICROSCOPIC
Bilirubin Urine: NEGATIVE
Glucose, UA: >1000 mg/dL — AB
Hgb urine dipstick: NEGATIVE
Ketones, ur: NEGATIVE mg/dL
Leukocytes, UA: NEGATIVE
Nitrite: NEGATIVE
Protein, ur: NEGATIVE mg/dL
pH: 7 (ref 5.0–8.0)

## 2013-08-09 LAB — SURGICAL PCR SCREEN: Staphylococcus aureus: NEGATIVE

## 2013-08-09 LAB — CBC
Hemoglobin: 15.6 g/dL (ref 13.0–17.0)
MCH: 30.9 pg (ref 26.0–34.0)
MCHC: 34.4 g/dL (ref 30.0–36.0)
MCV: 89.7 fL (ref 78.0–100.0)
Platelets: 216 10*3/uL (ref 150–400)
RBC: 5.05 MIL/uL (ref 4.22–5.81)

## 2013-08-09 LAB — TYPE AND SCREEN: ABO/RH(D): A POS

## 2013-08-09 LAB — APTT: aPTT: 31 seconds (ref 24–37)

## 2013-08-09 NOTE — Progress Notes (Signed)
Call to [facility], asked for B. Small to call [facility]

## 2013-08-09 NOTE — Progress Notes (Signed)
Spoke with S. Sexton,rep. With St. Jude, made of aware of pt., date & time of surgery.

## 2013-08-09 NOTE — Progress Notes (Signed)
Pt. Aware that he is hold Plavix starting 08/11/2013

## 2013-08-09 NOTE — Pre-Procedure Instructions (Signed)
LEOTHA VOELTZ  08/09/2013   Your procedure is scheduled on:  08/16/2013  Report to [facility]  2 * 3  ENTRANCE- A at 10:45 AM.  Call this number if you have problems the morning of surgery: [phone]   Remember:   Do not eat food or drink liquids after midnight.  On MONDAY   Take these medicines the morning of surgery with A SIP OF WATER: Baclofen, Carvedilol, pain medicine (if needed)   Do not wear jewelry  Do not wear lotions, powders, or perfumes. You may wear deodorant   Men may shave face and neck.  Do not bring valuables to the hospital.  [facility] is not responsible                  for any belongings or valuables.               Contacts, dentures or bridgework may not be worn into surgery.  Leave suitcase in the car. After surgery it may be brought to your room.  For patients admitted to the hospital, discharge time is determined by your                treatment team.               Patients discharged the day of surgery will not be allowed to drive  home.  Name and phone number of your driver:  With family  Special Instructions: Shower using CHG 2 nights before surgery and the night before surgery.  If you shower the day of surgery use CHG.  Use special wash - you have one bottle of CHG for all showers.  You should use approximately 1/3 of the bottle for each shower.   Please read over the following fact sheets that you were given: Pain Booklet, Coughing and Deep Breathing, Blood Transfusion Information, Total Joint Packet, MRSA Information and Surgical Site Infection Prevention

## 2013-08-09 NOTE — Progress Notes (Signed)
Conversation with [name], she will review pt.s' chart

## 2013-08-09 NOTE — Progress Notes (Signed)
Faxing ICD order to [facility] - device clinic.

## 2013-08-10 ENCOUNTER — Ambulatory Visit (INDEPENDENT_AMBULATORY_CARE_PROVIDER_SITE_OTHER): Payer: Medicare Other | Admitting: *Deleted

## 2013-08-10 DIAGNOSIS — Z95810 Presence of automatic (implantable) cardiac defibrillator: Secondary | ICD-10-CM

## 2013-08-10 DIAGNOSIS — I2589 Other forms of chronic ischemic heart disease: Secondary | ICD-10-CM

## 2013-08-10 LAB — MDC_IDC_ENUM_SESS_TYPE_INCLINIC
Brady Statistic RV Percent Paced: 1 %
Implantable Pulse Generator Serial Number: 541189
Lead Channel Impedance Value: 330 Ohm
Lead Channel Impedance Value: 360 Ohm
Lead Channel Pacing Threshold Amplitude: 0.5 V
Lead Channel Pacing Threshold Amplitude: 1 V
Lead Channel Pacing Threshold Pulse Width: 0.5 ms
Lead Channel Pacing Threshold Pulse Width: 0.5 ms
Lead Channel Sensing Intrinsic Amplitude: 11.7 mV
Lead Channel Sensing Intrinsic Amplitude: 3.3 mV

## 2013-08-10 NOTE — Progress Notes (Signed)
Error

## 2013-08-10 NOTE — Progress Notes (Signed)
Anesthesia Chart Review:  Patient is a 67 year old [name] scheduled for right TKA on 08/09/13 by Dr. Doneen Poisson.  History includes CAD/MI s/p CABG 08/29/07, ischemic cardiomyopathy, chronic systolic CHF, St. Jude ICD 04/05/08, HTN, PAF, osteoarthritis, former smoker, CKD, DM2, OSA, anemia, CVA '90's with right hemiparesis, carotid artery stenosis (with history of right CEA; 40-59% LICA, 0-39% RICA by 08/27/12 duplex). Evaluated for acute cholecystitis in June but treated medically because of increased risk due to significant cardiac history and other comorbidities. PCP is Dr. [name] in [place].  Cardiologist was Dr. Daleen Squibb, but with most recent visit with Dr. Purvis Sheffield on 07/12/13.  Per note, "Preoperative risk stratification: the patient has complex cardiovascular disease and is at moderate to high risk for a perioperative major adverse cardiac event. He is fully aware of this. He is on good medical therapy to mitigate this risk as much as possible, which includes ASA, Plavix, Lipitor, Coreg, Lasix, and Aldactone. A stress test done nearly a year ago revealed no evidence of ischemia, but extensive myocardial scar. I will obtain an echocardiogram to assess his LV systolic function. I explained the risks of undergoing an elective knee replacement surgery, including MI, CVA, and death, and he is aware of these risks but would like to proceed. If he is able to successfully undergo knee replacement, he would be able to walk again which would be better for his overall cardiovascular health." Patient is holding Plavix starting 08/11/13.  Echo summary from 07/15/13 showed: Mild LVH with LVEF 30-35%, wall motion abnormalities consistent with ischemic cardiomyopathy. Grade 1 diastolic dysfunction. Moderate left atrial enlargement. MAC with trivial mitral regurgitation. Mildly sclerotic aortic valve. Mild RV enlargement with mildly decreased contraction. Device wire in right heart. Mild tricuspid  regurgitation with PASP 28 mmHg. Normal CVP.  Nuclear stress test on 07/29/12 showed: Final interpretation: Abnormal Lexiscan Myoview with chest pain but no diagnostic electrocardiographic changes. The scintigraphic results show extensive prior distal anteroseptal, apical and inferior infarcts. No ischemia noted. The gated ejection fraction was 37% and there is apical and inferior akinesis and septal hypokinesis. There is evidence of left ventricular enlargement.  Cardiac Cath on 04/05/2008 showed: 1. Moderate reduction in left ventricular function, but improved from the previous acute study prior to revascularization surgery.  2. Continued patency of the internal mammary to the left anterior descending.  3. Continued patency of saphenous vein graft to the obtuse marginal.  4. Continued patency of saphenous vein graft to the posterior descending.  5. Scattered disease involving the intermediate and the atrioventricular circumflex distally with moderate stenosis, but small-to-moderate-sized vessels.   EKG on 07/12/13 showed SR with first degree AVB, inferior infarct (age undetermined), anterolateral infarct (age undetermined).  Preoperative CXR and labs noted.  Patient is considered moderate to high risk by his cardiologist.  Patient is willing to accept these risks.  If no acute changes then I would anticipate that he could proceed as planned.  Velna [facility]/Anesthesiology Phone [phone] 08/10/2013 6:05 PM

## 2013-08-10 NOTE — Progress Notes (Signed)
Device check in clinic, all functions normal, Changed PVARP from to due to sensing Atrial events but still pacing.  Full details in PaceArt.  ROV w/ device clinic 11/16/13.

## 2013-08-15 ENCOUNTER — Encounter (INDEPENDENT_AMBULATORY_CARE_PROVIDER_SITE_OTHER): Payer: Self-pay

## 2013-08-15 ENCOUNTER — Ambulatory Visit (HOSPITAL_BASED_OUTPATIENT_CLINIC_OR_DEPARTMENT_OTHER): Payer: Medicare Other

## 2013-08-15 VITALS — BP 134/81 | HR 79 | Temp 96.8°F | Resp 18

## 2013-08-15 DIAGNOSIS — E538 Deficiency of other specified B group vitamins: Secondary | ICD-10-CM

## 2013-08-15 DIAGNOSIS — D649 Anemia, unspecified: Secondary | ICD-10-CM

## 2013-08-15 MED ORDER — CYANOCOBALAMIN 1000 MCG/ML IJ SOLN
INTRAMUSCULAR | Status: AC
  Filled 2013-08-15: qty 1

## 2013-08-15 MED ORDER — CEFAZOLIN SODIUM-DEXTROSE 2-3 GM-% IV SOLR
2.0000 g | INTRAVENOUS | Status: AC
  Administered 2013-08-16: 2 g via INTRAVENOUS
  Filled 2013-08-15: qty 50

## 2013-08-15 MED ORDER — CYANOCOBALAMIN 1000 MCG/ML IJ SOLN
1000.0000 ug | Freq: Once | INTRAMUSCULAR | Status: AC
  Administered 2013-08-15: 1000 ug via INTRAMUSCULAR

## 2013-08-15 NOTE — Progress Notes (Signed)
Pt notified of time change;to arrive at 1015

## 2013-08-16 ENCOUNTER — Encounter (HOSPITAL_COMMUNITY): Payer: Medicare Other | Admitting: Vascular Surgery

## 2013-08-16 ENCOUNTER — Encounter (HOSPITAL_COMMUNITY)
Admission: RE | Disposition: A | Discharge status: Home Health | Payer: Self-pay | Source: Ambulatory Visit | Attending: Orthopaedic Surgery

## 2013-08-16 ENCOUNTER — Inpatient Hospital Stay (HOSPITAL_COMMUNITY)
Admission: RE | Admit: 2013-08-16 | Discharge: 2013-08-18 | DRG: 470 | Disposition: A | Discharge status: Home Health | Payer: Medicare Other | Source: Ambulatory Visit | Attending: Orthopaedic Surgery | Admitting: Orthopaedic Surgery

## 2013-08-16 ENCOUNTER — Encounter (HOSPITAL_COMMUNITY): Payer: Self-pay | Admitting: Anesthesiology

## 2013-08-16 ENCOUNTER — Encounter (INDEPENDENT_AMBULATORY_CARE_PROVIDER_SITE_OTHER): Payer: Self-pay

## 2013-08-16 ENCOUNTER — Ambulatory Visit: Payer: Medicare Other

## 2013-08-16 ENCOUNTER — Ambulatory Visit (HOSPITAL_COMMUNITY): Payer: Medicare Other | Admitting: Vascular Surgery

## 2013-08-16 ENCOUNTER — Ambulatory Visit (HOSPITAL_COMMUNITY): Payer: Medicare Other

## 2013-08-16 DIAGNOSIS — Z833 Family history of diabetes mellitus: Secondary | ICD-10-CM

## 2013-08-16 DIAGNOSIS — Z823 Family history of stroke: Secondary | ICD-10-CM

## 2013-08-16 DIAGNOSIS — Z87891 Personal history of nicotine dependence: Secondary | ICD-10-CM

## 2013-08-16 DIAGNOSIS — I129 Hypertensive chronic kidney disease with stage 1 through stage 4 chronic kidney disease, or unspecified chronic kidney disease: Secondary | ICD-10-CM | POA: Diagnosis present

## 2013-08-16 DIAGNOSIS — I5022 Chronic systolic (congestive) heart failure: Secondary | ICD-10-CM | POA: Diagnosis present

## 2013-08-16 DIAGNOSIS — Z8673 Personal history of transient ischemic attack (TIA), and cerebral infarction without residual deficits: Secondary | ICD-10-CM

## 2013-08-16 DIAGNOSIS — I739 Peripheral vascular disease, unspecified: Secondary | ICD-10-CM | POA: Diagnosis present

## 2013-08-16 DIAGNOSIS — Z951 Presence of aortocoronary bypass graft: Secondary | ICD-10-CM

## 2013-08-16 DIAGNOSIS — Z96659 Presence of unspecified artificial knee joint: Secondary | ICD-10-CM

## 2013-08-16 DIAGNOSIS — Z8249 Family history of ischemic heart disease and other diseases of the circulatory system: Secondary | ICD-10-CM

## 2013-08-16 DIAGNOSIS — I4891 Unspecified atrial fibrillation: Secondary | ICD-10-CM | POA: Diagnosis present

## 2013-08-16 DIAGNOSIS — G4730 Sleep apnea, unspecified: Secondary | ICD-10-CM | POA: Diagnosis present

## 2013-08-16 DIAGNOSIS — M1710 Unilateral primary osteoarthritis, unspecified knee: Principal | ICD-10-CM | POA: Diagnosis present

## 2013-08-16 DIAGNOSIS — Z95810 Presence of automatic (implantable) cardiac defibrillator: Secondary | ICD-10-CM

## 2013-08-16 DIAGNOSIS — M1711 Unilateral primary osteoarthritis, right knee: Secondary | ICD-10-CM

## 2013-08-16 DIAGNOSIS — I2589 Other forms of chronic ischemic heart disease: Secondary | ICD-10-CM | POA: Diagnosis present

## 2013-08-16 DIAGNOSIS — I509 Heart failure, unspecified: Secondary | ICD-10-CM | POA: Diagnosis present

## 2013-08-16 DIAGNOSIS — I252 Old myocardial infarction: Secondary | ICD-10-CM

## 2013-08-16 DIAGNOSIS — I2510 Atherosclerotic heart disease of native coronary artery without angina pectoris: Secondary | ICD-10-CM | POA: Diagnosis present

## 2013-08-16 DIAGNOSIS — N189 Chronic kidney disease, unspecified: Secondary | ICD-10-CM | POA: Diagnosis present

## 2013-08-16 HISTORY — PX: TOTAL KNEE ARTHROPLASTY: SHX125

## 2013-08-16 LAB — GLUCOSE, CAPILLARY: Glucose-Capillary: 153 mg/dL — ABNORMAL HIGH (ref 70–99)

## 2013-08-16 SURGERY — ARTHROPLASTY, KNEE, TOTAL
Anesthesia: General | Site: Knee | Laterality: Right | Wound class: Clean

## 2013-08-16 MED ORDER — SODIUM CHLORIDE 0.9 % IV SOLN
INTRAVENOUS | Status: DC
  Administered 2013-08-16: 1 mL via INTRAVENOUS
  Administered 2013-08-17: 08:00:00 via INTRAVENOUS

## 2013-08-16 MED ORDER — BACLOFEN 20 MG PO TABS
40.0000 mg | ORAL_TABLET | Freq: Every day | ORAL | Status: DC
  Administered 2013-08-16 – 2013-08-17 (×2): 40 mg via ORAL
  Filled 2013-08-16 (×3): qty 2

## 2013-08-16 MED ORDER — LACTATED RINGERS IV SOLN
INTRAVENOUS | Status: DC
  Administered 2013-08-16: 10:00:00 via INTRAVENOUS

## 2013-08-16 MED ORDER — PROPOFOL 10 MG/ML IV BOLUS
INTRAVENOUS | Status: DC | PRN
  Administered 2013-08-16: 120 mg via INTRAVENOUS

## 2013-08-16 MED ORDER — NITROGLYCERIN 0.4 MG SL SUBL: 0.4000 mg | SUBLINGUAL_TABLET | SUBLINGUAL | Status: DC | PRN

## 2013-08-16 MED ORDER — LIDOCAINE HCL (CARDIAC) 20 MG/ML IV SOLN
INTRAVENOUS | Status: DC | PRN
  Administered 2013-08-16: 100 mg via INTRAVENOUS

## 2013-08-16 MED ORDER — ZOLPIDEM TARTRATE 5 MG PO TABS: 5.0000 mg | ORAL_TABLET | Freq: Every evening | ORAL | Status: DC | PRN

## 2013-08-16 MED ORDER — PHENOL 1.4 % MT LIQD
1.0000 | OROMUCOSAL | Status: DC | PRN
  Administered 2013-08-17: 1 via OROMUCOSAL
  Filled 2013-08-16: qty 177

## 2013-08-16 MED ORDER — OXYCODONE HCL 5 MG/5ML PO SOLN: 5.0000 mg | Freq: Once | ORAL | Status: DC | PRN

## 2013-08-16 MED ORDER — ONDANSETRON HCL 4 MG/2ML IJ SOLN
INTRAMUSCULAR | Status: DC | PRN
  Administered 2013-08-16: 4 mg via INTRAVENOUS

## 2013-08-16 MED ORDER — MENTHOL 3 MG MT LOZG: 1.0000 | LOZENGE | OROMUCOSAL | Status: DC | PRN

## 2013-08-16 MED ORDER — METHOCARBAMOL 100 MG/ML IJ SOLN
500.0000 mg | Freq: Four times a day (QID) | INTRAVENOUS | Status: DC | PRN
  Filled 2013-08-16: qty 5

## 2013-08-16 MED ORDER — POLYETHYLENE GLYCOL 3350 17 G PO PACK: 17.0000 g | PACK | Freq: Every day | ORAL | Status: DC | PRN

## 2013-08-16 MED ORDER — HYDROMORPHONE HCL PF 1 MG/ML IJ SOLN
1.0000 mg | INTRAMUSCULAR | Status: DC | PRN
  Administered 2013-08-16 – 2013-08-17 (×8): 1 mg via INTRAVENOUS
  Filled 2013-08-16 (×8): qty 1

## 2013-08-16 MED ORDER — FENTANYL CITRATE 0.05 MG/ML IJ SOLN
INTRAMUSCULAR | Status: DC | PRN
Start: 2013-08-16 — End: 2013-08-16
  Administered 2013-08-16 (×2): 50 ug via INTRAVENOUS
  Administered 2013-08-16 (×3): 25 ug via INTRAVENOUS
  Administered 2013-08-16 (×3): 50 ug via INTRAVENOUS
  Administered 2013-08-16: 25 ug via INTRAVENOUS
  Administered 2013-08-16: 50 ug via INTRAVENOUS

## 2013-08-16 MED ORDER — CANAGLIFLOZIN 300 MG PO TABS
1.0000 | ORAL_TABLET | Freq: Every day | ORAL | Status: DC
  Administered 2013-08-17 – 2013-08-18 (×2): 300 mg via ORAL
  Filled 2013-08-16 (×4): qty 1

## 2013-08-16 MED ORDER — HYDROMORPHONE HCL PF 1 MG/ML IJ SOLN
INTRAMUSCULAR | Status: AC
  Filled 2013-08-16: qty 1

## 2013-08-16 MED ORDER — ALUM & MAG HYDROXIDE-SIMETH 200-200-20 MG/5ML PO SUSP: 30.0000 mL | ORAL | Status: DC | PRN

## 2013-08-16 MED ORDER — NEOSTIGMINE METHYLSULFATE 1 MG/ML IJ SOLN
INTRAMUSCULAR | Status: DC | PRN
  Administered 2013-08-16: 4 mg via INTRAVENOUS

## 2013-08-16 MED ORDER — SPIRONOLACTONE 25 MG PO TABS
25.0000 mg | ORAL_TABLET | Freq: Every morning | ORAL | Status: DC
  Administered 2013-08-17 – 2013-08-18 (×2): 25 mg via ORAL
  Filled 2013-08-16 (×2): qty 1

## 2013-08-16 MED ORDER — DOCUSATE SODIUM 100 MG PO CAPS
100.0000 mg | ORAL_CAPSULE | Freq: Two times a day (BID) | ORAL | Status: DC
  Administered 2013-08-16 – 2013-08-18 (×4): 100 mg via ORAL
  Filled 2013-08-16 (×4): qty 1

## 2013-08-16 MED ORDER — CARVEDILOL 12.5 MG PO TABS
12.5000 mg | ORAL_TABLET | Freq: Two times a day (BID) | ORAL | Status: DC
  Administered 2013-08-17 – 2013-08-18 (×3): 12.5 mg via ORAL
  Filled 2013-08-16 (×5): qty 1

## 2013-08-16 MED ORDER — FENTANYL CITRATE 0.05 MG/ML IJ SOLN
50.0000 ug | INTRAMUSCULAR | Status: DC | PRN
  Filled 2013-08-16: qty 2

## 2013-08-16 MED ORDER — CLOPIDOGREL BISULFATE 75 MG PO TABS
75.0000 mg | ORAL_TABLET | Freq: Every day | ORAL | Status: DC
  Administered 2013-08-16 – 2013-08-18 (×3): 75 mg via ORAL
  Filled 2013-08-16 (×3): qty 1

## 2013-08-16 MED ORDER — METOCLOPRAMIDE HCL 10 MG PO TABS: 5.0000 mg | ORAL_TABLET | Freq: Three times a day (TID) | ORAL | Status: DC | PRN

## 2013-08-16 MED ORDER — GLYCOPYRROLATE 0.2 MG/ML IJ SOLN
INTRAMUSCULAR | Status: DC | PRN
  Administered 2013-08-16: 0.6 mg via INTRAVENOUS

## 2013-08-16 MED ORDER — ONDANSETRON HCL 4 MG PO TABS: 4.0000 mg | ORAL_TABLET | Freq: Four times a day (QID) | ORAL | Status: DC | PRN

## 2013-08-16 MED ORDER — BUPIVACAINE-EPINEPHRINE PF 0.5-1:200000 % IJ SOLN
INTRAMUSCULAR | Status: DC | PRN
  Administered 2013-08-16: 30 mL

## 2013-08-16 MED ORDER — GLIMEPIRIDE 4 MG PO TABS
4.0000 mg | ORAL_TABLET | Freq: Two times a day (BID) | ORAL | Status: DC
  Administered 2013-08-16 – 2013-08-18 (×4): 4 mg via ORAL
  Filled 2013-08-16 (×5): qty 1

## 2013-08-16 MED ORDER — ROCURONIUM BROMIDE 100 MG/10ML IV SOLN
INTRAVENOUS | Status: DC | PRN
  Administered 2013-08-16: 50 mg via INTRAVENOUS

## 2013-08-16 MED ORDER — ACETAMINOPHEN 325 MG PO TABS: 650.0000 mg | ORAL_TABLET | Freq: Four times a day (QID) | ORAL | Status: DC | PRN

## 2013-08-16 MED ORDER — MIDAZOLAM HCL 2 MG/2ML IJ SOLN
1.0000 mg | INTRAMUSCULAR | Status: DC | PRN
  Administered 2013-08-16: 2 mg via INTRAVENOUS
  Filled 2013-08-16: qty 2

## 2013-08-16 MED ORDER — METHOCARBAMOL 500 MG PO TABS
500.0000 mg | ORAL_TABLET | Freq: Four times a day (QID) | ORAL | Status: DC | PRN
  Administered 2013-08-16 – 2013-08-18 (×6): 500 mg via ORAL
  Filled 2013-08-16 (×6): qty 1

## 2013-08-16 MED ORDER — ONDANSETRON HCL 4 MG/2ML IJ SOLN: 4.0000 mg | Freq: Four times a day (QID) | INTRAMUSCULAR | Status: DC | PRN

## 2013-08-16 MED ORDER — DIPHENHYDRAMINE HCL 12.5 MG/5ML PO ELIX: 12.5000 mg | ORAL_SOLUTION | ORAL | Status: DC | PRN

## 2013-08-16 MED ORDER — MECLIZINE HCL 25 MG PO TABS
25.0000 mg | ORAL_TABLET | Freq: Four times a day (QID) | ORAL | Status: DC
  Administered 2013-08-16 – 2013-08-18 (×7): 25 mg via ORAL
  Filled 2013-08-16 (×10): qty 1

## 2013-08-16 MED ORDER — ARTIFICIAL TEARS OP OINT
TOPICAL_OINTMENT | OPHTHALMIC | Status: DC | PRN
  Administered 2013-08-16: 1 via OPHTHALMIC

## 2013-08-16 MED ORDER — SODIUM CHLORIDE 0.9 % IR SOLN
Status: DC | PRN
  Administered 2013-08-16 (×3): 1000 mL

## 2013-08-16 MED ORDER — TRAMADOL HCL 50 MG PO TABS
100.0000 mg | ORAL_TABLET | Freq: Four times a day (QID) | ORAL | Status: DC | PRN
  Administered 2013-08-17 – 2013-08-18 (×2): 100 mg via ORAL
  Filled 2013-08-16 (×2): qty 2

## 2013-08-16 MED ORDER — CEFAZOLIN SODIUM 1-5 GM-% IV SOLN
1.0000 g | Freq: Four times a day (QID) | INTRAVENOUS | Status: AC
  Administered 2013-08-16 (×2): 1 g via INTRAVENOUS
  Filled 2013-08-16 (×2): qty 50

## 2013-08-16 MED ORDER — ONDANSETRON HCL 4 MG/2ML IJ SOLN: 4.0000 mg | Freq: Once | INTRAMUSCULAR | Status: DC | PRN

## 2013-08-16 MED ORDER — MEPERIDINE HCL 25 MG/ML IJ SOLN: 6.2500 mg | INTRAMUSCULAR | Status: DC | PRN

## 2013-08-16 MED ORDER — LINAGLIPTIN 5 MG PO TABS
5.0000 mg | ORAL_TABLET | Freq: Every day | ORAL | Status: DC
  Administered 2013-08-17 – 2013-08-18 (×2): 5 mg via ORAL
  Filled 2013-08-16 (×3): qty 1

## 2013-08-16 MED ORDER — ACETAMINOPHEN 650 MG RE SUPP: 650.0000 mg | Freq: Four times a day (QID) | RECTAL | Status: DC | PRN

## 2013-08-16 MED ORDER — ASPIRIN EC 81 MG PO TBEC
81.0000 mg | DELAYED_RELEASE_TABLET | Freq: Every day | ORAL | Status: DC
  Administered 2013-08-16 – 2013-08-18 (×3): 81 mg via ORAL
  Filled 2013-08-16 (×3): qty 1

## 2013-08-16 MED ORDER — LACTATED RINGERS IV SOLN
INTRAVENOUS | Status: DC | PRN
  Administered 2013-08-16: 12:00:00 via INTRAVENOUS

## 2013-08-16 MED ORDER — METOCLOPRAMIDE HCL 5 MG/ML IJ SOLN: 5.0000 mg | Freq: Three times a day (TID) | INTRAMUSCULAR | Status: DC | PRN

## 2013-08-16 MED ORDER — HYDROMORPHONE HCL PF 1 MG/ML IJ SOLN
0.2500 mg | INTRAMUSCULAR | Status: DC | PRN
  Administered 2013-08-16 (×4): 0.5 mg via INTRAVENOUS

## 2013-08-16 MED ORDER — ATORVASTATIN CALCIUM 40 MG PO TABS
40.0000 mg | ORAL_TABLET | ORAL | Status: DC
  Administered 2013-08-16 – 2013-08-18 (×2): 40 mg via ORAL
  Filled 2013-08-16 (×2): qty 1

## 2013-08-16 MED ORDER — OXYCODONE HCL 5 MG PO TABS: 5.0000 mg | ORAL_TABLET | Freq: Once | ORAL | Status: DC | PRN

## 2013-08-16 MED ORDER — HYDROCODONE-ACETAMINOPHEN 5-325 MG PO TABS
1.0000 | ORAL_TABLET | ORAL | Status: DC | PRN
  Administered 2013-08-16 – 2013-08-18 (×9): 2 via ORAL
  Filled 2013-08-16 (×9): qty 2

## 2013-08-16 MED ORDER — FUROSEMIDE 40 MG PO TABS
40.0000 mg | ORAL_TABLET | Freq: Every day | ORAL | Status: DC | PRN
  Filled 2013-08-16: qty 1

## 2013-08-16 SURGICAL SUPPLY — 60 items
BANDAGE ELASTIC 6 VELCRO ST LF (GAUZE/BANDAGES/DRESSINGS) ×3 IMPLANT
BANDAGE ESMARK 6X9 LF (GAUZE/BANDAGES/DRESSINGS) ×1 IMPLANT
BLADE SAG 18X100X1.27 (BLADE) ×4 IMPLANT
BLADE SAW SGTL 13.0X1.19X90.0M (BLADE) ×2 IMPLANT
BNDG CMPR 9X6 STRL LF SNTH (GAUZE/BANDAGES/DRESSINGS) ×1
BNDG ESMARK 6X9 LF (GAUZE/BANDAGES/DRESSINGS) ×2
BOWL SMART MIX CTS (DISPOSABLE) ×1 IMPLANT
CEMENT BONE SIMPLEX SPEEDSET (Cement) ×2 IMPLANT
CLOTH BEACON ORANGE TIMEOUT ST (SAFETY) ×2 IMPLANT
COVER SURGICAL LIGHT HANDLE (MISCELLANEOUS) ×2 IMPLANT
CUFF TOURNIQUET SINGLE 34IN LL (TOURNIQUET CUFF) ×2 IMPLANT
CUFF TOURNIQUET SINGLE 44IN (TOURNIQUET CUFF) IMPLANT
DRAPE INCISE IOBAN 66X45 STRL (DRAPES) ×2 IMPLANT
DRAPE ORTHO SPLIT 77X108 STRL (DRAPES) ×4
DRAPE PROXIMA HALF (DRAPES) ×2 IMPLANT
DRAPE SURG ORHT 6 SPLT 77X108 (DRAPES) ×2 IMPLANT
DRAPE U-SHAPE 47X51 STRL (DRAPES) ×2 IMPLANT
DRSG PAD ABDOMINAL 8X10 ST (GAUZE/BANDAGES/DRESSINGS) ×2 IMPLANT
DURAPREP 26ML APPLICATOR (WOUND CARE) ×2 IMPLANT
ELECT REM PT RETURN 9FT ADLT (ELECTROSURGICAL) ×2
ELECTRODE REM PT RTRN 9FT ADLT (ELECTROSURGICAL) ×1 IMPLANT
EVACUATOR 1/8 PVC DRAIN (DRAIN) ×2 IMPLANT
FACESHIELD LNG OPTICON STERILE (SAFETY) ×5 IMPLANT
GAUZE XEROFORM 1X8 LF (GAUZE/BANDAGES/DRESSINGS) ×2 IMPLANT
GLOVE BIO SURGEON STRL SZ8 (GLOVE) ×4 IMPLANT
GLOVE BIOGEL PI IND STRL 8 (GLOVE) ×1 IMPLANT
GLOVE BIOGEL PI INDICATOR 8 (GLOVE) ×1
GLOVE ECLIPSE 7.0 STRL STRAW (GLOVE) ×2 IMPLANT
GLOVE ORTHO TXT STRL SZ7.5 (GLOVE) ×2 IMPLANT
GOWN PREVENTION PLUS LG XLONG (DISPOSABLE) ×1 IMPLANT
GOWN PREVENTION PLUS XLARGE (GOWN DISPOSABLE) ×4 IMPLANT
GOWN STRL NON-REIN LRG LVL3 (GOWN DISPOSABLE) ×4 IMPLANT
HANDPIECE INTERPULSE COAX TIP (DISPOSABLE) ×2
IMMOBILIZER KNEE 22 UNIV (SOFTGOODS) ×1 IMPLANT
KIT BASIN OR (CUSTOM PROCEDURE TRAY) ×2 IMPLANT
KIT ROOM TURNOVER OR (KITS) ×2 IMPLANT
KNEE/VIT E POLY LINER LEVEL 1B ×1 IMPLANT
MANIFOLD NEPTUNE II (INSTRUMENTS) ×2 IMPLANT
NDL SPNL 18GX3.5 QUINCKE PK (NEEDLE) ×1 IMPLANT
NEEDLE SPNL 18GX3.5 QUINCKE PK (NEEDLE) ×2 IMPLANT
NS IRRIG 1000ML POUR BTL (IV SOLUTION) ×2 IMPLANT
PACK TOTAL JOINT (CUSTOM PROCEDURE TRAY) ×2 IMPLANT
PAD ARMBOARD 7.5X6 YLW CONV (MISCELLANEOUS) ×4 IMPLANT
PADDING CAST COTTON 6X4 STRL (CAST SUPPLIES) ×2 IMPLANT
SET HNDPC FAN SPRY TIP SCT (DISPOSABLE) IMPLANT
SET PAD KNEE POSITIONER (MISCELLANEOUS) ×2 IMPLANT
SPONGE GAUZE 4X4 12PLY (GAUZE/BANDAGES/DRESSINGS) ×2 IMPLANT
STAPLER VISISTAT 35W (STAPLE) IMPLANT
STRIP CLOSURE SKIN 1/2X4 (GAUZE/BANDAGES/DRESSINGS) ×4 IMPLANT
SUCTION FRAZIER TIP 10 FR DISP (SUCTIONS) ×1 IMPLANT
SUT VIC AB 1 CT1 27 (SUTURE) ×4
SUT VIC AB 1 CT1 27XBRD ANBCTR (SUTURE) ×2 IMPLANT
SUT VIC AB 2-0 CT1 27 (SUTURE) ×4
SUT VIC AB 2-0 CT1 TAPERPNT 27 (SUTURE) ×2 IMPLANT
SUT VIC AB 4-0 PS2 27 (SUTURE) ×2 IMPLANT
TOWEL OR 17X24 6PK STRL BLUE (TOWEL DISPOSABLE) ×2 IMPLANT
TOWEL OR 17X26 10 PK STRL BLUE (TOWEL DISPOSABLE) ×2 IMPLANT
TRAY FOLEY CATH 16FRSI W/METER (SET/KITS/TRAYS/PACK) ×1 IMPLANT
WATER STERILE IRR 1000ML POUR (IV SOLUTION) ×3 IMPLANT
WRAP KNEE MAXI GEL POST OP (GAUZE/BANDAGES/DRESSINGS) ×2 IMPLANT

## 2013-08-16 NOTE — Progress Notes (Signed)
ICD turned back on by Lamar Sprinkles, [place]. [employer].

## 2013-08-16 NOTE — Progress Notes (Signed)
Orthopedic Tech Progress Note Patient Details:  [name] 01-15-1946 [id number]  CPM Right Knee CPM Right Knee: On Right Knee Flexion (Degrees): 60 Right Knee Extension (Degrees): 0 Additional Comments: put on ohf   Jennye Moccasin 08/16/2013, 6:40 PM

## 2013-08-16 NOTE — Anesthesia Preprocedure Evaluation (Signed)
Anesthesia Evaluation  Patient identified by MRN, date of birth, ID band Patient awake    Reviewed: Allergy & Precautions, H&P , NPO status , Patient's Chart, lab work & pertinent test results  Airway Mallampati: I TM Distance: >3 FB Neck ROM: Full    Dental   Pulmonary sleep apnea , former smoker,          Cardiovascular hypertension, Pt. on medications + CAD, + Past MI, + CABG and +CHF + dysrhythmias + pacemaker + Cardiac Defibrillator     Neuro/Psych    GI/Hepatic   Endo/Other  diabetes, Type 2, Oral Hypoglycemic Agents  Renal/GU Renal InsufficiencyRenal disease     Musculoskeletal   Abdominal   Peds  Hematology   Anesthesia Other Findings   Reproductive/Obstetrics                           Anesthesia Physical Anesthesia Plan  ASA: III  Anesthesia Plan: General   Post-op Pain Management:    Induction: Intravenous  Airway Management Planned: LMA  Additional Equipment:   Intra-op Plan:   Post-operative Plan: Extubation in OR  Informed Consent: I have reviewed the patients History and Physical, chart, labs and discussed the procedure including the risks, benefits and alternatives for the proposed anesthesia with the patient or authorized representative who has indicated his/her understanding and acceptance.     Plan Discussed with: CRNA and Surgeon  Anesthesia Plan Comments:         Anesthesia Quick Evaluation

## 2013-08-16 NOTE — Transfer of Care (Signed)
Immediate Anesthesia Transfer of Care Note  Patient: [name]  Procedure(s) Performed: Procedure(s): RIGHT TOTAL KNEE ARTHROPLASTY (Right)  Patient Location: PACU  Anesthesia Type:GA combined with regional for post-op pain  Level of Consciousness: awake, alert  and oriented  Airway & Oxygen Therapy: Patient Spontanous Breathing and Patient connected to nasal cannula oxygen  Post-op Assessment: Report given to PACU RN, Post -op Vital signs reviewed and stable and Patient moving all extremities X 4  Post vital signs: Reviewed and stable  Complications: No apparent anesthesia complications

## 2013-08-16 NOTE — Anesthesia Postprocedure Evaluation (Signed)
Anesthesia Post Note  Patient: [name]  Procedure(s) Performed: Procedure(s) (LRB): RIGHT TOTAL KNEE ARTHROPLASTY (Right)  Anesthesia type: General  Patient location: PACU  Post pain: Pain level controlled and Adequate analgesia  Post assessment: Post-op Vital signs reviewed, Patient's Cardiovascular Status Stable, Respiratory Function Stable, Patent Airway and Pain level controlled  Last Vitals:  Filed Vitals:   08/16/13 1600  BP:   Pulse: 63  Temp:   Resp: 14    Post vital signs: Reviewed and stable  Level of consciousness: awake, alert  and oriented  Complications: No apparent anesthesia complications

## 2013-08-16 NOTE — Progress Notes (Signed)
Orthopedic Tech Progress Note Patient Details:  [name] 02-May-1946 [id number] Start time 6:00 pm 0-60 Patient ID: [name], male   DOB: 10-10-1945, 67 y.o.   MRN: [id number]   [name] 08/16/2013, 6:41 PM

## 2013-08-16 NOTE — H&P (Signed)
TOTAL KNEE ADMISSION H&P  Patient is being admitted for right total knee arthroplasty.  Subjective:  Chief Complaint:right knee pain.  HPI: [name], 67 y.o. male, has a history of pain and functional disability in the right knee due to arthritis and has failed non-surgical conservative treatments for greater than 12 weeks to includeNSAID's and/or analgesics, corticosteriod injections, supervised PT with diminished ADL's post treatment, use of assistive devices, weight reduction as appropriate and activity modification.  Onset of symptoms was gradual, starting 6 years ago with gradually worsening course since that time. The patient noted no past surgery on the right knee(s).  Patient currently rates pain in the right knee(s) at 10 out of 10 with activity. Patient has night pain, worsening of pain with activity and weight bearing, pain that interferes with activities of daily living, pain with passive range of motion, crepitus and joint swelling.  Patient has evidence of subchondral cysts, subchondral sclerosis, periarticular osteophytes, joint subluxation and joint space narrowing by imaging studies. There is no active infection.  He has only recently been cleared for surgery by Cardiology who had previously on numerous occasions recommended against surgery.  Cariology actually sent notes to me saying that from their standpoint, he can proceed with a knee replacement due to his severe pain and poor quality of life.  He fully understands this and is well-informed of the risks involved including a cardia event and death.  Patient Active Problem List   Diagnosis Date Noted  . Arthritis of knee, right 08/16/2013  . Cholecystitis with cholelithiasis 03/07/2013  . AUTOMATIC IMPLANTABLE CARDIAC DEFIBRILLATOR SITU 03/13/2009  . DM (diabetes mellitus), secondary, with peripheral vascular complications 03/12/2009  . ANEMIA, IRON DEFICIENCY 03/12/2009  . HYPERTENSION 03/12/2009  . CARDIOMYOPATHY,  ISCHEMIC 03/12/2009  . CEREBROVASCULAR DISEASE 03/12/2009  . PVD 03/12/2009  . RENAL INSUFFICIENCY, CHRONIC 03/12/2009  . OSTEOARTHRITIS 03/12/2009  . CORONARY ARTERY BYPASS GRAFT, HX OF 03/12/2009   Past Medical History  Diagnosis Date  . Sinus bradycardia   . Tobacco abuse   . Chronic renal insufficiency   . Osteoarthritis   . Diabetes mellitus   . Mitral regurgitation   . Systolic CHF, chronic     Previously taken off lisinopril by primary doctor due to labs  . Carotid stenosis     a. Prior carotid dz/ surgery. b. Carotid dopp 07/2011 - 0-39% bilaterally.;   c. Doppler 11/13: 40-59% RICA, 0-39% LICA  . Cardiomyopathy, ischemic     a. Chronic systolic CHF s/p St. Jude dual chamber ICD 03/2008.  [name] Hx of CABG   . CAD (coronary artery disease)     a. STEMI s/p CABG 07/2007 (had preop cardiogenic shock, IABP, VDRF before surgery)  . Stroke     a. CVA 1990s - chronic pain in RUE after stroke.  [name] PAF (paroxysmal atrial fibrillation)     a. Poor Coumadin candidate due to hx of GI bleed.  . GI bleed     Reported history  . Splenomegaly   . Myocardial infarction   . Automatic implantable cardioverter-defibrillator in situ   . Hypertension     [facility]   . Sleep apnea     study done, 10 yrs ago, unable to tolerate CPAP  . Neuromuscular disorder     R sided weakness   . Anemia     Multifactorial  - hx of B12 def and iron deficiency, followed by heme, followed at [facility]    Past Surgical History  Procedure Laterality Date  . Coronary artery bypass graft    . Percutaneous coronary intervention  01/10/2005    Charlies Constable, MD  . Coronary artery bypass graft  08/29/2007    x3; Salvatore Decent. Cornelius Moras MD  . Cardiac defibrillator placement  04/05/2008    Implantation of a St. Jude dual-chamber defibrillator, Doylene Canning. Ladona Ridgel , MD  . Cardiac defibrillator placement    . Tonsillectomy    . Joint replacement  1990's    L knee    Prescriptions prior to admission   Medication Sig Dispense Refill  . aspirin EC 81 MG EC tablet Take 1 tablet (81 mg total) by mouth daily.      [name] atorvastatin (LIPITOR) 80 MG tablet Take 40 mg by mouth every other day.      . baclofen (LIORESAL) 20 MG tablet Take 40 mg by mouth at bedtime.       . Canagliflozin (INVOKANA) 300 MG TABS Take 1 tablet by mouth daily.      . carvedilol (COREG) 12.5 MG tablet Take 12.5 mg by mouth 2 (two) times daily with a meal.      . clopidogrel (PLAVIX) 75 MG tablet Take 1 tablet (75 mg total) by mouth daily.  30 tablet  5  . furosemide (LASIX) 40 MG tablet Take 40 mg by mouth daily as needed.       [name] glimepiride (AMARYL) 4 MG tablet Take 4 mg by mouth 2 (two) times daily.       [name] linagliptin (TRADJENTA) 5 MG TABS tablet Take 5 mg by mouth daily after breakfast.       . meclizine (ANTIVERT) 25 MG tablet Take 25 mg by mouth 4 (four) times daily. For dizziness      . nitroGLYCERIN (NITROSTAT) 0.4 MG SL tablet Place 1 tablet (0.4 mg total) under the tongue every 5 (five) minutes as needed for chest pain.  25 tablet  4  . spironolactone (ALDACTONE) 25 MG tablet Take 25 mg by mouth every morning.      . traMADol (ULTRAM) 50 MG tablet Take 50 mg by mouth 3 (three) times daily. For pain       Allergies  Allergen Reactions  . Codeine     REACTION: G I  distress    History  Substance Use Topics  . Smoking status: Former Smoker -- 1.00 packs/day for 15 years    Quit date: 09/29/1990  . Smokeless tobacco: Former Neurosurgeon    Quit date: 08/09/1988  . Alcohol Use: No    Family History  Problem Relation Age of Onset  . Heart attack Mother     CVA, MI  . Diabetes Mother   . Heart attack Father     MI  . Diabetes Sister   . Coronary artery disease Brother     CABG     Review of Systems  Musculoskeletal: Positive for joint pain.  All other systems reviewed and are negative.    Objective:  Physical Exam  Constitutional: He is oriented to person, place, and time. He appears well-developed  and well-nourished.  HENT:  Head: Normocephalic and atraumatic.  Neck: Normal range of motion. Neck supple.  Cardiovascular: Normal rate.   Respiratory: Effort normal and breath sounds normal.  GI: Soft. Bowel sounds are normal.  Musculoskeletal:       Right knee: He exhibits decreased range of motion, effusion and abnormal alignment. Tenderness found. Medial joint line, lateral joint line and patellar tendon tenderness noted.  Neurological: He is alert and oriented to person, place, and time.  Skin: Skin is warm and dry.  Psychiatric: He has a normal mood and affect.    Vital signs in last 24 hours: Temp:  [97.1 F (36.2 C)] 97.1 F (36.2 C) (11/18 0952) Pulse Rate:  [59-66] 60 (11/18 1145) Resp:  [13-24] 18 (11/18 1145) BP: (106-151)/(64-80) 112/65 mmHg (11/18 1135) SpO2:  [99 %-100 %] 100 % (11/18 1145)  Labs:   Estimated body mass index is 26.54 kg/(m^2) as calculated from the following:   Height as of 08/09/13: 6\' 1"  (1.854 m).   Weight as of 07/19/13: 91.218 kg (201 lb 1.6 oz).   Imaging Review Plain radiographs demonstrate severe degenerative joint disease of the right knee(s). The overall alignment issignificant varus. The bone quality appears to be good for age and reported activity level.  Assessment/Plan:  End stage arthritis, right knee   The patient history, physical examination, clinical judgment of the provider and imaging studies are consistent with end stage degenerative joint disease of the right knee(s) and total knee arthroplasty is deemed medically necessary. The treatment options including medical management, injection therapy arthroscopy and arthroplasty were discussed at length. The risks and benefits of total knee arthroplasty were presented and reviewed. The risks due to aseptic loosening, infection, stiffness, patella tracking problems, thromboembolic complications and other imponderables were discussed. The patient acknowledged the explanation, agreed  to proceed with the plan and consent was signed. Patient is being admitted for inpatient treatment for surgery, pain control, PT, OT, prophylactic antibiotics, VTE prophylaxis, progressive ambulation and ADL's and discharge planning. The patient is planning to be discharged to skilled nursing facility

## 2013-08-16 NOTE — Preoperative (Signed)
Beta Blockers   Reason not to administer Beta Blockers:Not Applicable 

## 2013-08-16 NOTE — Anesthesia Procedure Notes (Signed)
Anesthesia Regional Block:  Adductor canal block  Pre-Anesthetic Checklist: ,, timeout performed, Correct Patient, Correct Site, Correct Laterality, Correct Procedure, Correct Position, site marked, Risks and benefits discussed,  Surgical consent,  Pre-op evaluation,  At surgeon's request and post-op pain management  Laterality: Right  Prep: chloraprep       Needles:  Injection technique: Single-shot  Needle Type: Echogenic Stimulator Needle      Needle Gauge: 21 and 21 G    Additional Needles:  Procedures: ultrasound guided (picture in chart) Adductor canal block Narrative:  Start time: 08/16/2013 12:15 PM End time: 08/16/2013 12:30 PM Injection made incrementally with aspirations every 5 mL.  Performed by: Personally  Anesthesiologist: Arta Bruce MD  Additional Notes: Monitors applied. Patient sedated. Sterile prep and drape,hand hygiene and sterile gloves were used. Relevant anatomy identified.Needle position confirmed.Local anesthetic injected incrementally after negative aspiration. Local anesthetic spread visualized around nerve(s). Vascular puncture avoided. No complications. Image printed for medical record.The patient tolerated the procedure well.    Arta Bruce MD  Adductor canal block

## 2013-08-16 NOTE — Brief Op Note (Signed)
08/16/2013  3:13 PM  PATIENT:  [name]  67 y.o. male  PRE-OPERATIVE DIAGNOSIS:  Endstage osteoarthritis right knee  POST-OPERATIVE DIAGNOSIS:  End Stage Osteoarthritis Right knee  PROCEDURE:  Procedure(s): RIGHT TOTAL KNEE ARTHROPLASTY (Right)  SURGEON:  Surgeon(s) and Role:    * Kathryne Hitch, MD - Primary  PHYSICIAN ASSISTANT: Rexene Edison, PA-C  ASSISTANTS: none   ANESTHESIA:   general  EBL:  Total I/O In: 1400 [I.V.:1400] Out: 275 [Urine:200; Blood:75]  BLOOD ADMINISTERED:none  DRAINS: medium hemovac  LOCAL MEDICATIONS USED:  NONE  SPECIMEN:  No Specimen  DISPOSITION OF SPECIMEN:  N/A  COUNTS:  YES  TOURNIQUET:   Total Tourniquet Time Documented: Thigh (Right) - 65 minutes Total: Thigh (Right) - 65 minutes   DICTATION: .Other Dictation: Dictation Number [id number]  PLAN OF CARE: Admit to inpatient   PATIENT DISPOSITION:  PACU - hemodynamically stable.   Delay start of Pharmacological VTE agent (>24hrs) due to surgical blood loss or risk of bleeding: no

## 2013-08-17 LAB — GLUCOSE, CAPILLARY
Glucose-Capillary: 118 mg/dL — ABNORMAL HIGH (ref 70–99)
Glucose-Capillary: 142 mg/dL — ABNORMAL HIGH (ref 70–99)
Glucose-Capillary: 179 mg/dL — ABNORMAL HIGH (ref 70–99)
Glucose-Capillary: 196 mg/dL — ABNORMAL HIGH (ref 70–99)

## 2013-08-17 LAB — BASIC METABOLIC PANEL
BUN: 13 mg/dL (ref 6–23)
CO2: 20 mEq/L (ref 19–32)
GFR calc Af Amer: >90 mL/min (ref >90)
GFR calc non Af Amer: 86 mL/min — ABNORMAL LOW (ref >90)
Glucose, Bld: 110 mg/dL — ABNORMAL HIGH (ref 70–99)
Potassium: 4.3 mEq/L (ref 3.5–5.1)
Sodium: 137 mEq/L (ref 135–145)

## 2013-08-17 LAB — CBC
HCT: 40.2 % (ref 39.0–52.0)
Hemoglobin: 13.5 g/dL (ref 13.0–17.0)
MCHC: 33.6 g/dL (ref 30.0–36.0)
RBC: 4.44 MIL/uL (ref 4.22–5.81)
RDW: 13.6 % (ref 11.5–15.5)
WBC: 12.9 10*3/uL — ABNORMAL HIGH (ref 4.0–10.5)

## 2013-08-17 MED ORDER — HYDROCODONE-ACETAMINOPHEN 5-325 MG PO TABS: 1.0000 | ORAL_TABLET | ORAL | Status: DC | PRN

## 2013-08-17 NOTE — Progress Notes (Signed)
Subjective: 1 Day Post-Op Procedure(s) (LRB): RIGHT TOTAL KNEE ARTHROPLASTY (Right) Patient reports pain as severe.  Complaining of sore throat. Denies any chest pain or SOB.  Objective: Vital signs in last 24 hours: Temp:  [97.1 F (36.2 C)-98.1 F (36.7 C)] 97.5 F (36.4 C) (11/19 0555) Pulse Rate:  [59-77] 71 (11/19 0555) Resp:  [9-24] 16 (11/19 0555) BP: (106-171)/(56-85) 126/56 mmHg (11/19 0555) SpO2:  [93 %-100 %] 95 % (11/19 0555)  Intake/Output from previous day: 11/18 0701 - 11/19 0700 In: 1550 [I.V.:1550] Out: 2500 [Urine:1800; Drains:625; Blood:75] Intake/Output this shift: Total I/O In: 900 [I.V.:900] Out: -    Recent Labs  08/17/13 0519  HGB 13.5    Recent Labs  08/17/13 0519  WBC 12.9*  RBC 4.44  HCT 40.2  PLT 172    Recent Labs  08/17/13 0519  NA 137  K 4.3  CL 101  CO2 20  BUN 13  CREATININE 0.93  GLUCOSE 110*  CALCIUM 8.6   No results found for this basename: LABPT, INR,  in the last 72 hours  Neurovascular intact Sensation intact distally Intact pulses distally Dorsiflexion/Plantar flexion intact Incision: dressing C/D/I Compartment soft  Assessment/Plan: 1 Day Post-Op Procedure(s) (LRB): RIGHT TOTAL KNEE ARTHROPLASTY (Right) Up with therapy Drain pulled  Incentive spirometry  [place] Every 08/17/2013, 8:28 AM

## 2013-08-17 NOTE — Evaluation (Signed)
Physical Therapy Evaluation Patient Details Name: [name] MRN: [id number] DOB: 1945/11/15 Today's Date: 08/17/2013 Time: 4098-1191 PT Time Calculation (min): 29 min  PT Assessment / Plan / Recommendation History of Present Illness  s/p RTKA; of note, pt with history of CVA effecting R UE coordination, 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  Clinical Impression  Pt is s/p TKA resulting in the deficits listed below (see PT Problem List).  Pt will benefit from skilled PT to increase their independence and safety with mobility to allow discharge to the venue listed below.      PT Assessment  Patient needs continued PT services    Follow Up Recommendations  Home health PT;Supervision/Assistance - 24 hour    Does the patient have the potential to tolerate intense rehabilitation      Barriers to Discharge        Equipment Recommendations  Rolling walker with 5" wheels;3in1 (PT)    Recommendations for Other Services OT consult   Frequency 7X/week    Precautions / Restrictions Precautions Precautions: Knee Required Braces or Orthoses: Knee Immobilizer - Right Knee Immobilizer - Right: On when out of bed or walking Restrictions Weight Bearing Restrictions: Yes RLE Weight Bearing: Weight bearing as tolerated   Pertinent Vitals/Pain 6/10 pain R knee with WBing patient repositioned for comfort and optimal knee ext      Mobility  Bed Mobility Bed Mobility: Supine to Sit;Sitting - Scoot to Edge of Bed Supine to Sit: 4: Min assist;With rails Sitting - Scoot to Edge of Bed: 4: Min guard;With rail Details for Bed Mobility Assistance: Cues for technique Transfers Transfers: Sit to Stand;Stand to Sit Sit to Stand: 3: Mod assist;From bed Stand to Sit: 4: Min assist;To chair/3-in-1;With upper extremity assist;With armrests Details for Transfer Assistance: Cues for sequencing and technique Ambulation/Gait Ambulation/Gait Assistance: 4: Min assist Ambulation Distance (Feet): 45  Feet Assistive device: Rolling walker Ambulation/Gait Assistance Details: Cues for gait sequence and to activate R quad for stance stability Gait Pattern: Step-to pattern    Exercises Total Joint Exercises Quad Sets: AROM;Right;10 reps Heel Slides: AAROM;Right;5 reps   PT Diagnosis: Difficulty walking;Acute pain  PT Problem List: Decreased strength;Decreased range of motion;Decreased activity tolerance;Decreased balance;Decreased mobility;Decreased knowledge of use of DME;Decreased knowledge of precautions;Pain PT Treatment Interventions: DME instruction;Gait training;Stair training;Functional mobility training;Therapeutic activities;Therapeutic exercise;Patient/family education     PT Goals(Current goals can be found in the care plan section) Acute Rehab PT Goals Patient Stated Goal: to walk PT Goal Formulation: With patient Time For Goal Achievement: 08/24/13 Potential to Achieve Goals: Good  Visit Information  Last PT Received On: 08/17/13 Assistance Needed: +1 History of Present Illness: s/p RTKA; of note, pt with history of CVA effecting R UE coordination, 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       Prior Functioning  Home Living Family/patient expects to be discharged to:: Private residence Living Arrangements: Spouse/significant other Available Help at Discharge: Family;Available 24 hours/day Type of Home: House Home Access: Stairs to enter [employer] of Steps: 4 Entrance Stairs-Rails: Right Home Layout: One level Home Equipment:  (need clarification) Prior Function Level of Independence: Independent Communication Communication: No difficulties;Other (comment) (wife is HOH, reads lips)    Cognition  Cognition Arousal/Alertness: Awake/alert Behavior During Therapy: WFL for tasks assessed/performed Overall Cognitive Status: Within Functional Limits for tasks assessed    Extremity/Trunk Assessment Upper Extremity Assessment Upper Extremity Assessment: Defer to  OT evaluation Lower Extremity Assessment Lower Extremity Assessment: RLE deficits/detail RLE Deficits / Details:  Decr AROM and strength, limited by pain RLE: Unable to fully assess due to pain   Balance    End of Session PT - End of Session Equipment Utilized During Treatment: Gait belt;Right knee immobilizer Activity Tolerance: Patient tolerated treatment well Patient left: in chair;with call bell/phone within reach;with family/visitor present Nurse Communication: Mobility status  GP     Olen Pel [place], [place] [phone]  08/17/2013, 2:08 PM

## 2013-08-17 NOTE — Care Management Note (Signed)
Utilization review completed. Ercell Perlman, RN BSN 

## 2013-08-17 NOTE — Progress Notes (Signed)
08/17/13 Spoke with patient and his wife about HHC. They chose [facility]. Spoke with De[facility] at [facility] and set up HHPT. [employer] provided rolling walker and 3N1. No other discharge needs identified. Will continue to follow. Jacquelynn Cree RN, BSN, CCM

## 2013-08-17 NOTE — Progress Notes (Signed)
Physical Therapy Treatment Note  Good progress with walking this session; Limited L knee flexion makes sit<>stand more difficult  Reports r knee pain as "tolerable" Did not rate patient repositioned for comfort in CPM   08/17/13 1600  PT Visit Information  Last PT Received On 08/17/13  Assistance Needed +1  History of Present Illness s/p RTKA; of note, pt with history of CVA effecting R UE coordination, 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  PT Time Calculation  PT Start Time 1537  PT Stop Time 1557  PT Time Calculation (min) 20 min  Subjective Data  Patient Stated Goal to walk  Precautions  Precautions Knee  Required Braces or Orthoses Knee Immobilizer - Right  Knee Immobilizer - Right On when out of bed or walking  Restrictions  Weight Bearing Restrictions Yes  RLE Weight Bearing WBAT  Cognition  Arousal/Alertness Awake/alert  Behavior During Therapy WFL for tasks assessed/performed  Overall Cognitive Status Within Functional Limits for tasks assessed  Bed Mobility  Bed Mobility Supine to Sit;Sitting - Scoot to Edge of Bed  Supine to Sit 4: Min assist;With rails  Sitting - Scoot to [place] of Bed 4: Min guard;With rail  Details for Bed Mobility Assistance Cues for technique  Transfers  Transfers Sit to Stand;Stand to Sit  Sit to Stand From bed;4: Min assist  Stand to Sit 4: Min assist;To chair/3-in-1;With upper extremity assist;With armrests  Details for Transfer Assistance Cues for sequencing and technique; Limited L knee flexion ROM makes it difficult for pt to translate his weight over feet  Ambulation/Gait  Ambulation/Gait Assistance 4: Min guard  Ambulation Distance (Feet) 60 Feet  Assistive device Rolling walker  Ambulation/Gait Assistance Details Cues for gait sequence and to activate quad for stance stability  Gait Pattern Step-to pattern  PT - End of Session  Equipment Utilized During Treatment Gait belt;Right knee immobilizer  Activity Tolerance Patient tolerated  treatment well  Patient left with call bell/phone within reach;with family/visitor present;in bed;in CPM  PT - Assessment/Plan  PT Plan Current plan remains appropriate  PT Frequency 7X/week  Recommendations for Other Services OT consult  Follow Up Recommendations Home health PT;Supervision/Assistance - 24 hour  PT equipment Rolling walker with 5" wheels;3in1 (PT)  PT Goal Progression  Progress towards PT goals Progressing toward goals  Acute Rehab PT Goals  PT Goal Formulation With patient  Time For Goal Achievement 08/24/13  Potential to Achieve Goals Good  PT General Charges  $$ ACUTE PT VISIT 1 Procedure  PT Treatments  $Gait Training 8-22 mins   [place], [place] [phone]

## 2013-08-17 NOTE — Op Note (Signed)
NAMESTONY, STEGMANN [name]:  [id number]  MEDICAL RECORD [name]:  [id number]  LOCATION:  5N19C                        FACILITY:  [facility]  PHYSICIAN:  Vanita Panda. Magnus Ivan, M.D.DATE OF BIRTH:  1945-11-24  DATE OF PROCEDURE:  08/16/2013 DATE OF DISCHARGE:                              OPERATIVE REPORT   PREOPERATIVE DIAGNOSIS:  Severe end-stage arthritis, degenerative joint disease, right knee.  POSTOPERATIVE DIAGNOSIS:  Severe end-stage arthritis, degenerative joint disease, right knee.  PROCEDURE:  Right total knee arthroplasty.  IMPLANTS:  Stryker triathlon knee with size 5 femur, size 6 tibial tray, 11 mm polyethylene insert, size 40 patella button.  SURGEON:  Vanita Panda. Magnus Ivan, MD  ASSISTING:  Richardean Canal, PA-C.  ANESTHESIA:  General.  BLOOD LOSS:  Less than 100 mL.  TOURNIQUET TIME:  Less than 2 hours.  COMPLICATIONS:  None.  INDICATIONS:  Mr. Middleton is a very pleasant 67 year old gentleman that I have known for several years now.  He has debilitating end-stage arthritis of his right knee that is affecting his daily life.  His mobility is severely limited.  His pain has gotten so severe that his activities of daily living are severely limited.  He has been told on numerous occasions that the surgery is quite complicated for him.  He has not been cleared from cardiac standpoint for a long period of time. Recently, his activities of daily living became so diminished and his quality of life so poor that he sought Cardiology to proceed with surgery.  He understands it is quite risky, but he also feels that he can go on with the pain being so severe.  At this point, he wished for a total knee arthroplasty in spite of the risks.  Gotten a cardiology approval as well.  PROCEDURE DESCRIPTION:  After informed consent was obtained, appropriate right knee was marked.  He was brought to the operating room, placed supine on the operating room table.   General anesthesia was then obtained.  A nonsterile tourniquet was placed around his upper right leg and his right leg was prepped and draped from the thigh down to the foot with DuraPrep and sterile drapes.  Time-out was called to identify correct patient, correct right knee.  Of note, he has a significant varus deformity of his knee with complete loss of the articular cartilage throughout.  We then used Esmarch to wrap out the leg and tourniquet was inflated to 300 mm of pressure.  We made a midline incision over the knee and carried this proximally and distally.  We dissected down the knee joint, carried out medial parapatellar arthrotomy where we found a large effusion and the knee completely devoid of cartilage with a significant varus deformity.  We cleaned the knee of osteophytes and debrided the remnants of the debris and remnants of the ACL, PCL, medial and lateral meniscus.  We then first started with our extramedullary tibial cutting guide with the knee flexed.  We set the guide in place for taking 9 mm off the high side of the tibia and correcting for varus deformity and 0 slope.  We made our tibial cut. Next, we used an intramedullary guide for the femur and with  a 5-degree right femoral cutting block for a distal femoral cut, we set this at 10 mm cut.  We then made that cut and brought the knee down in full extension, and I was pleased with his balance with the 11 mm polyethylene insert.  I had already released the medial tissue as well. We went back to the femur again and put our femoral sizing guide on based on the epicondylar axis.  We then chose a 4 in 1 cutting block for a size 5 femur.  We made our anterior and posterior cuts followed by our chamfer cuts.  We then made our femoral box cut for a size 5 femur as well.  We then went back to the tibia.  We sized the tibia for a size 6 tibia and then made our kill cut for this.  With the trial size 6 tibia and the size 5  femur, we placed an 11-mm polyethylene insert and then cut our patella to take 11 mm off the patella and drilled lugs for size 40 patella button.  With all trial components in place and towel clips along the arthrotomy, I put the knee through range of motion.  I was pleased with its alignment and stability.  We then removed all trial components and copiously irrigated the knee with normal saline solution using pulsatile lavage.  We then cemented the real Stryker triathlon tray size 6 followed by the real size 5 femur.  We placed the real 11 mm fix bearing polyethylene insert and cemented the size 40 patella button. Once this was all dried, we let the tourniquet down and copiously irrigated the knee again with normal saline solution.  We used electrocautery for hemostasis within the knee and placed a medium Hemovac drain.  We then closed the arthrotomy with interrupted #1 Vicryl suture followed by 2-0 Vicryl in subcutaneous tissue, and staples on the skin.  Well-padded sterile dressing was applied.  He was awakened, extubated and taken to the recovery room in stable condition.  All final counts were correct.  There were no complications noted.  Of note, Kriste Basque, PA-C, was present the entire case.  His presence was very crucial with retracting assisting with the implant, positioning, and removal of excess cement, as well as closure of the wound.     Vanita Panda. Magnus Ivan, M.D.     CYB/MEDQ  D:  08/16/2013  T:  08/17/2013  Job:  [id number]

## 2013-08-18 ENCOUNTER — Ambulatory Visit: Payer: Medicare Other

## 2013-08-18 LAB — CBC
HCT: 31 % — ABNORMAL LOW (ref 39.0–52.0)
Hemoglobin: 10.7 g/dL — ABNORMAL LOW (ref 13.0–17.0)
MCH: 30.3 pg (ref 26.0–34.0)
MCHC: 34.5 g/dL (ref 30.0–36.0)
MCV: 87.8 fL (ref 78.0–100.0)
Platelets: 161 K/uL (ref 150–400)
RBC: 3.53 MIL/uL — ABNORMAL LOW (ref 4.22–5.81)
RDW: 13.6 % (ref 11.5–15.5)
WBC: 10.4 K/uL (ref 4.0–10.5)

## 2013-08-18 LAB — GLUCOSE, CAPILLARY: Glucose-Capillary: 235 mg/dL — ABNORMAL HIGH (ref 70–99)

## 2013-08-18 NOTE — Evaluation (Signed)
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 Pt should benefit from acute OT to assist in maximizing independence prior to d/c home w/ family/spouse 24/supervision and HHOT. Pt states he may go home later today if able.    OT Assessment  Patient needs continued OT Services    Follow Up Recommendations  Home health OT;Supervision/Assistance - 24 hour    Barriers to Discharge      Equipment Recommendations  None recommended by OT;Other (comment) (Pt has DME)    Recommendations for Other Services    Frequency  Min 2X/week    Precautions / Restrictions Precautions Precautions: Knee;Fall Required Braces or Orthoses: Knee Immobilizer - Right Knee Immobilizer - Right: On when out of bed or walking Restrictions Weight Bearing Restrictions: Yes RLE Weight Bearing: Weight bearing as tolerated   Pertinent Vitals/Pain 7/10 R Knee pain, RN made aware, pt requested pain medication. Activity, repositioned, rest.    ADL  Grooming: Performed;Wash/dry hands;Min guard (Standing at sink) Where Assessed - Grooming: Supported standing;Unsupported standing Upper Body Bathing: Simulated;Set up Where Assessed - Upper Body Bathing: Unsupported sitting Lower Body Bathing: Simulated;Minimal assistance Where Assessed - Lower Body Bathing: Supported sit to stand Upper Body Dressing: Simulated;Set up Where Assessed - Upper Body Dressing: Unsupported sitting Lower Body Dressing: Performed;Minimal assistance (Don socks) Where Assessed - Lower Body Dressing: Supported sit to  Pharmacist, hospital: Performed;Supervision/safety;Min Pension scheme manager Method: Sit to Barista: Raised toilet seat with arms (or 3-in-1 over toilet) Where Assessed - Toileting Clothing Manipulation and Hygiene: Sit on 3-in-1 or toilet;Sit to stand from 3-in-1 or toilet Tub/Shower Transfer Method: Not assessed Equipment Used: Rolling walker;Gait belt;Knee Immobilizer;Other (comment) (3:1 over toilet) Transfers/Ambulation Related to ADLs: Pt currently min A sit to stand, Min guard assist w/ vc's for safety for toilet transfers. Pt's wife reports she is comfortable assisting husband at home as she has observed/participated in rehab in past. ADL Comments: Pt and family were educated in role of OT, pt reports that he has DME at home. Discussed LB bathing/dressing techniques and toilet transfers. Pt wife able to assist pt for sit to stand from EOB given Min A. Pt benefits from vc's for safety, sequencing and hand placement. Pt states he may go home later today, recommend HHOT for home safety assessment and recommendations.    OT Diagnosis: Acute pain;Generalized weakness  OT Problem List: Decreased activity tolerance;Pain;Decreased knowledge of precautions;Decreased knowledge of use of DME or AE;Impaired UE functional use OT Treatment Interventions: Self-care/ADL training;DME and/or AE instruction;Patient/family education;Therapeutic activities   OT Goals(Current goals can be found in the care plan section) Acute Rehab OT Goals Patient Stated Goal: Decrease pain in R knee Time For Goal Achievement: 09/01/13 Potential to Achieve Goals: Good  Visit Information  Last OT Received On: 08/18/13 Assistance Needed: +1 History of Present Illness: s/p RTKA; of note, pt with history of CVA effecting R UE coordination, 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       Prior Functioning     Home Living Family/patient expects to be discharged to:: Private residence Living Arrangements:  Spouse/significant other Available Help at Discharge: Family;Available 24 hours/day Type of Home: House Home Access: Stairs to enter [employer] of Steps: 4 Entrance Stairs-Rails: Right Home Layout: One level Home Equipment: Bedside commode;Walker - 2 wheels;Other (comment) (Can borrow shower chair) Prior Function Level of Independence: Independent Communication Communication: No difficulties;HOH Dominant Hand: Left    Vision/Perception Vision - History Baseline Vision: Wears glasses for distance only Patient Visual Report: No change from baseline   Cognition  Cognition Arousal/Alertness: Awake/alert Behavior During Therapy: WFL for tasks assessed/performed Overall Cognitive Status: Within Functional Limits for tasks assessed    Extremity/Trunk Assessment Upper Extremity Assessment Upper Extremity Assessment: RUE deficits/detail RUE Deficits / Details: CVA w/ R sided weakness x25 yrs ago RUE Sensation: decreased light touch RUE Coordination: decreased fine motor Lower Extremity Assessment Lower Extremity Assessment: Defer to PT evaluation    Mobility Bed Mobility Bed Mobility: Supine to Sit;Sitting - Scoot to Edge of Bed;Sit to Supine Supine to Sit: With rails;HOB flat;4: Min guard Sitting - Scoot to Edge of Bed: 5: Supervision;With rail Details for Bed Mobility Assistance: VC's for sequencing and technique. Assist for movement and support of RLE. Transfers Transfers: Sit to Stand;Stand to Sit Sit to Stand: 4: Min assist;With upper extremity assist;From bed;From chair/3-in-1;With armrests Stand to Sit: 4: Min assist;To chair/3-in-1;With upper extremity assist Details for Transfer Assistance: VC's for hand placement, safety and sequencing.       Balance    End of Session OT - End of Session Equipment Utilized During Treatment: Gait belt;Rolling walker;Right knee immobilizer Activity Tolerance: Patient tolerated treatment well Patient left: in bed;with call  bell/phone within reach;with nursing/sitter in room;with family/visitor present Nurse Communication: Mobility status;Patient requests pain meds;Other (comment) (Pt pulled IV out when transferring to 3:1 in bathroom)  GO     Charletta Cousin, Kashara Blocher Beth Dixon 08/18/2013, 1:15 PM

## 2013-08-18 NOTE — Progress Notes (Signed)
Subjective: 2 Days Post-Op Procedure(s) (LRB): RIGHT TOTAL KNEE ARTHROPLASTY (Right) Patient reports pain as moderate.  No complaints.  Objective: Vital signs in last 24 hours: Temp:  [98.3 F (36.8 C)-99 F (37.2 C)] 98.5 F (36.9 C) (11/20 0543) Pulse Rate:  [66-73] 66 (11/20 0543) Resp:  [18] 18 (11/20 0543) BP: (115-124)/(53-58) 117/53 mmHg (11/20 0543) SpO2:  [95 %] 95 % (11/20 0543)  Intake/Output from previous day: 11/19 0701 - 11/20 0700 In: 2150 [P.O.:480; I.V.:1670] Out: 1500 [Urine:1500] Intake/Output this shift:     Recent Labs  08/17/13 0519 08/18/13 0430  HGB 13.5 10.7*    Recent Labs  08/17/13 0519 08/18/13 0430  WBC 12.9* 10.4  RBC 4.44 3.53*  HCT 40.2 31.0*  PLT 172 161    Recent Labs  08/17/13 0519  NA 137  K 4.3  CL 101  CO2 20  BUN 13  CREATININE 0.93  GLUCOSE 110*  CALCIUM 8.6   No results found for this basename: LABPT, INR,  in the last 72 hours  Neurovascular intact Sensation intact distally Intact pulses distally Dorsiflexion/Plantar flexion intact Incision: dressing C/D/I Compartment soft Dressing removed wound benign Assessment/Plan: 2 Days Post-Op Procedure(s) (LRB): RIGHT TOTAL KNEE ARTHROPLASTY (Right) Up with therapy Discharge home later today if does well with PT  [name] 08/18/2013, 8:20 AM

## 2013-08-18 NOTE — Progress Notes (Signed)
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 Pt's wife reports that she feels comfortable returning home and caring for pt as she has been present for his rehab through his stroke and cardiac surgery. Pt fearful of falling, however did well on the stairs, needing only min guard after his anxiety lessened.   Follow Up Recommendations  Home health PT;Supervision/Assistance - 24 hour     Does the patient have the potential to tolerate intense rehabilitation     Barriers to Discharge        Equipment Recommendations  Rolling walker with 5" wheels;3in1 (PT)    Recommendations for Other Services    Frequency 7X/week   Progress towards PT Goals Progress towards PT goals: Progressing toward goals  Plan Current plan remains appropriate    Precautions / Restrictions Precautions Precautions: Knee;Fall Required Braces or Orthoses: Knee Immobilizer - Right Knee Immobilizer - Right: On when out of bed or walking Restrictions Weight Bearing Restrictions: Yes RLE Weight Bearing: Weight bearing as tolerated   Pertinent Vitals/Pain 5/10 at rest    Mobility  Bed Mobility Bed Mobility: Supine to Sit;Sitting - Scoot to Edge of Bed Supine to Sit: 4: Min assist;With rails;HOB flat Sitting - Scoot to [place] of Bed: 4: Min assist Details for Bed Mobility Assistance: VC's for sequencing and technique. Assist for movement and support of RLE. Transfers Transfers: Sit to Stand;Stand to Sit Sit to Stand: 3: Mod assist;From bed;With upper extremity assist Stand to Sit: 4: Min assist;To chair/3-in-1;With upper extremity assist Details for  Transfer Assistance: VC's for hand placement on seated surface. Ambulation/Gait Ambulation/Gait Assistance: 4: Min guard Ambulation Distance (Feet): 60 Feet Assistive device: Rolling walker Ambulation/Gait Assistance Details: VC's for increased heel strike and encouraged step-through gait pattern.  Gait Pattern: Step-to pattern;Decreased stride length Gait velocity: Decreased Stairs: Yes Stairs Assistance: 4: Min assist;4: Min guard Stairs Assistance Details (indicate cue type and reason): Min assist for first step as pt was anxious and fearful of falling, however after that only required min guard. Wife present for stair trainings Stair Management Technique: Two rails Number of Stairs: 3    Exercises Total Joint Exercises Ankle Circles/Pumps: 10 reps Quad Sets: 5 reps   PT Diagnosis:    PT Problem List:   PT Treatment Interventions:     PT Goals (current goals can now be found in the care plan section) Acute Rehab PT Goals Patient Stated Goal: to walk PT Goal Formulation: With patient Time For Goal Achievement: 08/24/13 Potential to Achieve Goals: Good  Visit Information  Last PT Received On: 08/18/13 Assistance Needed: +1 History of Present Illness: s/p RTKA; of note, pt with history of CVA effecting R UE coordination, 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    Subjective Data  Subjective: "It's going to take me awhile to get this." Patient Stated Goal: to walk   Cognition  Cognition Arousal/Alertness: Awake/alert Behavior During Therapy: WFL for tasks assessed/performed Overall Cognitive Status: Within Functional Limits for tasks assessed    Balance     End of Session PT - End of Session Equipment Utilized During Treatment: Gait belt;Right knee immobilizer Activity Tolerance: Patient  tolerated treatment well Patient left: with call bell/phone within reach;with family/visitor present;in CPM;in chair Nurse Communication: Mobility status CPM Right Knee CPM Right Knee:  On Right Knee Flexion (Degrees): 60 Right Knee Extension (Degrees): 0   GP     Ruthann Cancer 08/18/2013, 12:22 PM  Ruthann Cancer, PT, DPT [phone]

## 2013-08-18 NOTE — Progress Notes (Signed)
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 He was able to negotiate the steps with increased ease and confidence this afternoon. He was also able to perform bed mobility and transfers with less assist required than morning session. Pt is to d/c this afternoon, and pt and wife report they feel confident getting in and around their home.  Follow Up Recommendations  Home health PT;Supervision/Assistance - 24 hour     Does the patient have the potential to tolerate intense rehabilitation     Barriers to Discharge        Equipment Recommendations  Rolling walker with 5" wheels;3in1 (PT)    Recommendations for Other Services    Frequency 7X/week   Progress towards PT Goals Progress towards PT goals: Progressing toward goals  Plan Current plan remains appropriate    Precautions / Restrictions Precautions Precautions: Knee;Fall Required Braces or Orthoses: Knee Immobilizer - Right Knee Immobilizer - Right: On when out of bed or walking Restrictions Weight Bearing Restrictions: Yes RLE Weight Bearing: Weight bearing as tolerated   Pertinent Vitals/Pain 6/10 at rest    Mobility  Bed Mobility Bed Mobility: Supine to Sit Supine to Sit: 4: Min assist;With rails;HOB flat Sitting - Scoot to Edge of Bed: 5: Supervision;With rail Details for Bed Mobility Assistance: VC's for sequencing and technique. Assist for movement and support of RLE. Transfers Transfers: Sit to Stand;Stand to Sit Sit to Stand: 4: Min assist;From bed;With upper extremity assist Stand to Sit: 4: Min assist;To chair/3-in-1;With upper extremity assist Details for  Transfer Assistance: VC's for hand placement, safety awareness and sequencing with the RW. Ambulation/Gait Ambulation/Gait Assistance: 4: Min guard Ambulation Distance (Feet): 175 Feet Assistive device: Rolling walker Ambulation/Gait Assistance Details: VC's for improved posture, increased heel strike and step-through gait pattern Gait Pattern: Step-to pattern;Step-through pattern;Decreased stride length Gait velocity: Decreased Stairs: Yes Stairs Assistance: 4: Min guard Stairs Assistance Details (indicate cue type and reason): Min assist for first step as pt was anxious and fearful of falling, however after that only required min guard. Wife present for stair trainings Stair Management Technique: Two rails Number of Stairs: 3    Exercises Total Joint Exercises Ankle Circles/Pumps: 10 reps Quad Sets: 10 reps Heel Slides: 10 reps Hip ABduction/ADduction: 10 reps Straight Leg Raises: 10 reps;AAROM Long Arc Quad: 10 reps Goniometric ROM: 2-84   PT Diagnosis:    PT Problem List:   PT Treatment Interventions:     PT Goals (current goals can now be found in the care plan section) Acute Rehab PT Goals Patient Stated Goal: Decrease pain in R knee PT Goal Formulation: With patient Time For Goal Achievement: 08/24/13 Potential to Achieve Goals: Good  Visit Information  Last PT Received On: 08/18/13 Assistance Needed: +1 History of Present Illness: s/p RTKA; of note, pt with history of CVA effecting R UE coordination, 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    Subjective Data  Subjective: I guess I feel okay about going home Patient Stated Goal: Decrease pain in R knee   Cognition  Cognition Arousal/Alertness: Awake/alert Behavior During Therapy: WFL for tasks assessed/performed Overall Cognitive Status: Within Functional Limits for tasks assessed  Balance     End of Session PT - End of Session Equipment Utilized During Treatment: Gait belt;Right knee immobilizer Activity Tolerance:  Patient tolerated treatment well Patient left: with call bell/phone within reach;with family/visitor present;in CPM;in chair Nurse Communication: Mobility status   GP     Ruthann Cancer 08/18/2013, 3:58 PM  Ruthann Cancer, PT, DPT [phone]

## 2013-08-19 ENCOUNTER — Encounter (HOSPITAL_COMMUNITY): Payer: Self-pay | Admitting: Orthopaedic Surgery

## 2013-08-19 NOTE — Discharge Summary (Signed)
Patient ID: [name] MRN: [id number] DOB/AGE: [birth year] 66 y.o.  Admit date: 08/16/2013 Discharge date: 08/19/2013  Admission Diagnoses:  Principal Problem:   Arthritis of knee, right   Discharge Diagnoses:  Right total knee arthroplasty Acute blood loss anemia secondary to surgery asymtomatic  Past Medical History  Diagnosis Date  . Sinus bradycardia   . Tobacco abuse   . Chronic renal insufficiency   . Osteoarthritis   . Diabetes mellitus   . Mitral regurgitation   . Systolic CHF, chronic     Previously taken off lisinopril by primary doctor due to labs  . Carotid stenosis     a. Prior carotid dz/ surgery. b. Carotid dopp 07/2011 - 0-39% bilaterally.;   c. Doppler 11/13: 40-59% RICA, 0-39% LICA  . Cardiomyopathy, ischemic     a. Chronic systolic CHF s/p St. Jude dual chamber ICD 03/2008.  [name] Hx of CABG   . CAD (coronary artery disease)     a. STEMI s/p CABG 07/2007 (had preop cardiogenic shock, IABP, VDRF before surgery)  . Stroke     a. CVA 1990s - chronic pain in RUE after stroke.  [name] PAF (paroxysmal atrial fibrillation)     a. Poor Coumadin candidate due to hx of GI bleed.  . GI bleed     Reported history  . Splenomegaly   . Myocardial infarction   . Automatic implantable cardioverter-defibrillator in situ   . Hypertension     [facility]   . Sleep apnea     study done, 10 yrs ago, unable to tolerate CPAP  . Neuromuscular disorder     R sided weakness   . Anemia     Multifactorial  - hx of B12 def and iron deficiency, followed by heme, followed at [facility]    Surgeries: Procedure(s): RIGHT TOTAL KNEE ARTHROPLASTY on 08/16/2013   Consultants:    Discharged Condition: Improved  Hospital Course: [name] is an 67 y.o. male who was admitted 08/16/2013 for operative treatment ofArthritis of knee, right. Patient has severe unremitting pain that affects sleep, daily activities, and work/hobbies. After pre-op clearance the  patient was taken to the operating room on 08/16/2013 and underwent  Procedure(s): RIGHT TOTAL KNEE ARTHROPLASTY.    Patient was given perioperative antibiotics: Anti-infectives   Start     Dose/Rate Route Frequency Ordered Stop   08/16/13 1800  ceFAZolin (ANCEF) IVPB 1 g/50 mL premix     1 g 100 mL/hr over 30 Minutes Intravenous Every 6 hours 08/16/13 1752 08/17/13 0002   08/16/13 0600  ceFAZolin (ANCEF) IVPB 2 g/50 mL premix     2 g 100 mL/hr over 30 Minutes Intravenous On call to O.R. 08/15/13 1412 08/16/13 1324       Patient was given sequential compression devices, early ambulation, and chemoprophylaxis to prevent DVT.  Patient benefited maximally from hospital stay and there were no complications.    Recent vital signs: Patient Vitals for the past 24 hrs:  Resp SpO2  08/18/13 1135 18 98 %     Recent laboratory studies:  Recent Labs  08/17/13 0519 08/18/13 0430  WBC 12.9* 10.4  HGB 13.5 10.7*  HCT 40.2 31.0*  PLT 172 161  NA 137  --   K 4.3  --   CL 101  --   CO2 20  --   BUN 13  --   CREATININE 0.93  --   GLUCOSE 110*  --   CALCIUM 8.6  --  Discharge Medications:     Medication List    STOP taking these medications       traMADol 50 MG tablet  Commonly known as:  ULTRAM      TAKE these medications       aspirin 81 MG EC tablet  Take 1 tablet (81 mg total) by mouth daily.     atorvastatin 80 MG tablet  Commonly known as:  LIPITOR  Take 40 mg by mouth every other day.     baclofen 20 MG tablet  Commonly known as:  LIORESAL  Take 40 mg by mouth at bedtime.     carvedilol 12.5 MG tablet  Commonly known as:  COREG  Take 12.5 mg by mouth 2 (two) times daily with a meal.     clopidogrel 75 MG tablet  Commonly known as:  PLAVIX  Take 1 tablet (75 mg total) by mouth daily.     furosemide 40 MG tablet  Commonly known as:  LASIX  Take 40 mg by mouth daily as needed.     glimepiride 4 MG tablet  Commonly known as:  AMARYL  Take 4 mg by  mouth 2 (two) times daily.     HYDROcodone-acetaminophen 5-325 MG per tablet  Commonly known as:  NORCO/VICODIN  Take 1-2 tablets by mouth every 4 (four) hours as needed for moderate pain (breakthrough pain).     INVOKANA 300 MG Tabs  Generic drug:  Canagliflozin  Take 1 tablet by mouth daily.     linagliptin 5 MG Tabs tablet  Commonly known as:  TRADJENTA  Take 5 mg by mouth daily after breakfast.     meclizine 25 MG tablet  Commonly known as:  ANTIVERT  Take 25 mg by mouth 4 (four) times daily. For dizziness     nitroGLYCERIN 0.4 MG SL tablet  Commonly known as:  NITROSTAT  Place 1 tablet (0.4 mg total) under the tongue every 5 (five) minutes as needed for chest pain.     spironolactone 25 MG tablet  Commonly known as:  ALDACTONE  Take 25 mg by mouth every morning.        Diagnostic Studies: Dg Chest 2 View  08/09/2013   CLINICAL DATA:  Preop right total knee replacement.  EXAM: CHEST  2 VIEW  COMPARISON:  03/05/2013  FINDINGS: Right dual lead pacer/ AICD remains in place, unchanged. Prior CABG. Heart is normal size. Lungs are clear. No effusions or acute bony abnormality.  IMPRESSION: No active cardiopulmonary disease.   Electronically Signed   By: Charlett Nose M.D.   On: 08/09/2013 13:07   Dg Knee Right Port  08/16/2013   CLINICAL DATA:  Total knee arthroplasty.  EXAM: PORTABLE RIGHT KNEE - 1-2 VIEW  COMPARISON:  None.  FINDINGS: The knee demonstrates a total knee arthroplasty without evidence of hardware failure complication. There is no significant joint effusion. There is no fracture or dislocation. The alignment is anatomic. Surgical drains are present. Post-surgical changes noted in the surrounding soft tissues.  IMPRESSION: Right total knee arthroplasty.   Electronically Signed   By: Elige Ko   On: 08/16/2013 16:14    Disposition: 06-Home-Health Care Svc      Discharge Orders   Future Appointments Provider Department Dept Phone   09/13/2013 8:00 AM  [facility] [place] [facility] [phone]   10/11/2013 8:00 AM [facility] [place] [facility] [phone]   11/08/2013 8:00 AM [facility] [place]  [facility] [phone]   11/16/2013 9:30 AM [facility] 1 [employer] [phone]   12/06/2013 8:00 AM [facility] [place] [facility] [phone]   01/03/2014 8:00 AM Dava Najjar [facility] [phone]   01/03/2014 8:30 AM [facility] Covering Provider 1 [place] [facility] [phone]   01/03/2014 9:00 AM [facility] Nurse [place] [facility] [phone]   Future Orders Complete By Expires   Discharge wound care:  As directed    Comments:     May shower with dressing intact.Keep dressing cleanand intact until Monday, then remove and shower. Apply clean dressing after showering.   Weight bearing as tolerated  As directed    Questions:     Laterality:     Extremity:        Follow-up Information   Follow up with Kathryne Hitch, MD In 2 weeks.   Specialty:  Orthopedic Surgery   Contact information:   [address] [place] [address] [phone]        Signed: Richardean Canal 08/19/2013, 9:56 AM

## 2013-09-13 ENCOUNTER — Ambulatory Visit (HOSPITAL_BASED_OUTPATIENT_CLINIC_OR_DEPARTMENT_OTHER): Payer: Medicare Other

## 2013-09-13 VITALS — BP 127/72 | HR 70 | Temp 97.5°F

## 2013-09-13 DIAGNOSIS — E538 Deficiency of other specified B group vitamins: Secondary | ICD-10-CM

## 2013-09-13 DIAGNOSIS — D649 Anemia, unspecified: Secondary | ICD-10-CM

## 2013-09-13 MED ORDER — CYANOCOBALAMIN 1000 MCG/ML IJ SOLN
1000.0000 ug | Freq: Once | INTRAMUSCULAR | Status: AC
  Administered 2013-09-13: 1000 ug via INTRAMUSCULAR

## 2013-09-14 ENCOUNTER — Encounter: Payer: Self-pay | Admitting: Internal Medicine

## 2013-09-15 ENCOUNTER — Ambulatory Visit: Payer: Medicare Other

## 2013-10-11 ENCOUNTER — Ambulatory Visit (HOSPITAL_BASED_OUTPATIENT_CLINIC_OR_DEPARTMENT_OTHER): Payer: Medicare HMO

## 2013-10-11 ENCOUNTER — Encounter (INDEPENDENT_AMBULATORY_CARE_PROVIDER_SITE_OTHER): Payer: Self-pay

## 2013-10-11 VITALS — BP 129/60 | HR 65 | Temp 98.2°F

## 2013-10-11 DIAGNOSIS — D649 Anemia, unspecified: Secondary | ICD-10-CM

## 2013-10-11 DIAGNOSIS — E538 Deficiency of other specified B group vitamins: Secondary | ICD-10-CM

## 2013-10-11 MED ORDER — CYANOCOBALAMIN 1000 MCG/ML IJ SOLN
1000.0000 ug | Freq: Once | INTRAMUSCULAR | Status: AC
  Administered 2013-10-11: 1000 ug via INTRAMUSCULAR

## 2013-10-13 ENCOUNTER — Ambulatory Visit (HOSPITAL_COMMUNITY)
Admission: RE | Admit: 2013-10-13 | Discharge: 2013-10-13 | Disposition: A | Discharge status: Home / Self Care | Payer: Medicare HMO | Source: Ambulatory Visit | Attending: Orthopaedic Surgery | Admitting: Orthopaedic Surgery

## 2013-10-13 DIAGNOSIS — IMO0001 Reserved for inherently not codable concepts without codable children: Secondary | ICD-10-CM | POA: Insufficient documentation

## 2013-10-13 DIAGNOSIS — M25561 Pain in right knee: Secondary | ICD-10-CM

## 2013-10-13 DIAGNOSIS — M25569 Pain in unspecified knee: Secondary | ICD-10-CM | POA: Insufficient documentation

## 2013-10-13 DIAGNOSIS — R269 Unspecified abnormalities of gait and mobility: Secondary | ICD-10-CM | POA: Insufficient documentation

## 2013-10-13 DIAGNOSIS — M25669 Stiffness of unspecified knee, not elsewhere classified: Secondary | ICD-10-CM | POA: Insufficient documentation

## 2013-10-13 DIAGNOSIS — M25661 Stiffness of right knee, not elsewhere classified: Secondary | ICD-10-CM | POA: Insufficient documentation

## 2013-10-13 DIAGNOSIS — E119 Type 2 diabetes mellitus without complications: Secondary | ICD-10-CM | POA: Insufficient documentation

## 2013-10-13 DIAGNOSIS — I10 Essential (primary) hypertension: Secondary | ICD-10-CM | POA: Insufficient documentation

## 2013-10-13 NOTE — Progress Notes (Signed)
Physical Therapy Evaluation  Patient Details  Name: [name] MRN: [id number] Date of Birth: 10-Jun-1946  Today's Date: 10/13/2013 Time: 0915-1000 PT Time Calculation (min): 45 min Evaluation  0915- 1000             Visit#: 1 of 8  Re-eval: 11/12/13 Assessment Diagnosis: right TKR  Surgical Date: 08/16/13 Next MD Visit: Dr. Magnus IvanBlackman  Prior Therapy: home care   Authorization: medicare [employer]     Authorization Time Period:    Authorization Visit#: 1 of 8   Past Medical History:  Past Medical History  Diagnosis Date  . Sinus bradycardia   . Tobacco abuse   . Chronic renal insufficiency   . Osteoarthritis   . Diabetes mellitus   . Mitral regurgitation   . Systolic CHF, chronic     Previously taken off lisinopril by primary doctor due to labs  . Carotid stenosis     a. Prior carotid dz/ surgery. b. Carotid dopp 07/2011 - 0-39% bilaterally.;   c. Doppler 11/13: 40-59% RICA, 0-39% LICA  . Cardiomyopathy, ischemic     a. Chronic systolic CHF s/p St. Jude dual chamber ICD 03/2008.  [name]. Hx of CABG   . CAD (coronary artery disease)     a. STEMI s/p CABG 07/2007 (had preop cardiogenic shock, IABP, VDRF before surgery)  . Stroke     a. CVA 1990s - chronic pain in RUE after stroke.  [name]. PAF (paroxysmal atrial fibrillation)     a. Poor Coumadin candidate due to hx of GI bleed.  . GI bleed     Reported history  . Splenomegaly   . Myocardial infarction   . Automatic implantable cardioverter-defibrillator in situ   . Hypertension     [facility]   . Sleep apnea     study done, 10 yrs ago, unable to tolerate CPAP  . Neuromuscular disorder     R sided weakness   . Anemia     Multifactorial  - hx of B12 def and iron deficiency, followed by heme[facility]   Past Surgical History:  Past Surgical History  Procedure Laterality Date  . Coronary artery bypass graft    . Percutaneous coronary intervention  01/10/2005    Charlies ConstableBruce Brodie, MD  .  Coronary artery bypass graft  08/29/2007    x3; Salvatore Decentlarence H. Cornelius Moraswen MD  . Cardiac defibrillator placement  04/05/2008    Implantation of a St. Jude dual-chamber defibrillator, Doylene CanningGregg W. Ladona Ridgelaylor , MD  . Cardiac defibrillator placement    . Tonsillectomy    . Joint replacement  1990's    L knee  . Total knee arthroplasty Right 08/16/2013    Procedure: RIGHT TOTAL KNEE ARTHROPLASTY;  Surgeon: Kathryne Hitchhristopher Y Blackman, MD;  [facility];  Service: Orthopedics;  Laterality: Right;   Subjective Symptoms/Limitations Symptoms: Right TKR Nov 18,2014, c/o  right knee pain, overall improving, using cane prn, no stairs inside home, a few stairs to enter, lives with wife who prerforms most household ADLS  Pertinent History: home care , left TKR 15 years ago with continued liitations, needs revision left knee, Heart attack, stroke 25 years ago affectig ing right side  Limitations: Walking;House hold activities, difficulty with car transfers  How long can you walk comfortably?:  (5 minutese tolerance ) Patient Stated Goals: improve walking , decrease pain  Pain Assessment Currently in Pain?: Yes   B knees  Chronic  left knee pain  Pain Score: 5 /10  Pain Location: Knee Pain Orientation: Right Pain Type: Surgical pain , ache  Pain Onset: More than a month ago Pain Frequency: Constant Pain Relieving Factors: medication   Effect of Pain on Daily Activities: pain and stiffness B knee during sitting, walking, and ADLS  Balance Screening Balance Screen Has the patient fallen in the past 6 months: No Has the patient had a decrease in activity level because of a fear of falling? : Yes Is the patient reluctant to leave their home because of a fear of falling? : No  Prior Functioning  Prior Function  Able to Take Stairs?: Yes Driving: Yes Vocation: On disability Comments: ambuation no device prior to surgery   Cognition/Observation Observation/Other Assessments Other Assessments: R UE synergy , scar  healing, no drainage   Sensation/Coordination/Flexibility/Functional Tests Functional Tests Functional Tests: FOTO 52 Functional Tests: timed up and go > 30 sec no device , sit to stand 5 seconds left UE use   Assessment RLE AROM (degrees) Right Knee Extension: 0 Right Knee Flexion: 110, pain at end range  RLE PROM (degrees) Right Knee Flexion: 115 RLE Strength Right Hip Flexion: 4/5 Right Hip ABduction: 3/5 Right Knee Flexion: 4/5 Right Knee Extension: 4/5 LLE AROM (degrees) Left Knee Extension: 0 Left Knee Flexion: 95  Exercise/Treatments Mobility/Balance  Ambulation/Gait Ambulation/Gait: Yes Assistive device: None Gait Pattern: Wide base of support, decreased right knee flexion  Static Standing Balance Static Standing - Balance Support: Left upper extremity supported Static Standing - Level of Assistance: 5: Stand by assistance Single Leg Stance - Right Leg: 5    Aerobic Elliptical: Nu step L4     Standing Forward Step Up: 10 reps;Step Height: 6";Hand Hold: 1    Physical Therapy Assessment and Plan PT Assessment and Plan Clinical Impression Statement: 68 yr old male referred post right knee rep;acement on 08/16/2013. he requires skilled outpatient therapy to improve gait  and balance  for home and community safety. His overall mobillity is impaired per post op status with decreased strength in his R LE  And decreased right knee flexion ROM. Without skilled reahb patient is at [facility] for falls, onging pain, and knee stiffness leading to functional  deficitis  Pt will benefit from skilled therapeutic intervention in order to improve on the following deficits: Abnormal gait;Decreased balance;Pain;Decreased mobility;Decreased range of motion;Decreased strength Rehab Potential: Good Clinical Impairments Affecting Rehab Potential: hx of stroke right side, hx of left TKA with deficitis in ROM  PT Frequency: Min 2X/week PT Duration: 4 weeks PT Treatment/Interventions:  Gait training;Stair training;Functional mobility training;Therapeutic activities;Therapeutic exercise;Balance training;Neuromuscular re-education;Manual techniques;Modalities;Patient/family education PT Plan: gait, balance, knee flexion ROM  , functional right LE strengthening   Goals PT Short Term Goals Time to Complete Short Term Goals: 2 weeks PT Short Term Goal 1: pateint to improve right knee AROM 0-115 degrees to improve stair use, gait, and sitting tolerance  PT Short Term Goal 2: patient to transfer sit to stand and stand to sit with equal weight bearing on right and left LE  PT Long Term Goals Time to Complete Long Term Goals: 4 weeks PT Long Term Goal 1: Patient to improve FOTO score from 52 to 61  PT Long Term Goal 2: Patient to safely walk around floor obstacles  no device without loss of balance  Long Term Goal 3: patient to report community ambulation of 10-15 min no device without rest break   Problem List Patient Active Problem List   Diagnosis Date Noted  . Right knee  pain 10/13/2013  . Abnormality of gait 10/13/2013  . Stiffness of right knee 10/13/2013  . Arthritis of knee, right 08/16/2013  . Cholecystitis with cholelithiasis 03/07/2013  . AUTOMATIC IMPLANTABLE CARDIAC DEFIBRILLATOR SITU 03/13/2009  . DM (diabetes mellitus), secondary, with peripheral vascular complications 03/12/2009  . ANEMIA, IRON DEFICIENCY 03/12/2009  . HYPERTENSION 03/12/2009  . CARDIOMYOPATHY, ISCHEMIC 03/12/2009  . CEREBROVASCULAR DISEASE 03/12/2009  . PVD 03/12/2009  . RENAL INSUFFICIENCY, CHRONIC 03/12/2009  . OSTEOARTHRITIS 03/12/2009  . CORONARY ARTERY BYPASS GRAFT, HX OF 03/12/2009    PT Plan of Care PT Home Exercise Plan: progress next visit , balance  PT Patient Instructions: continue HEP which  was reveiwed today during evaluation  Consulted and Agree with Plan of Care: Patient  GP Functional Assessment Tool Used: FOTO  Functional Limitation: Mobility: Walking and moving  around Mobility: Walking and Moving Around Current Status (J8119): At least 40 percent but less than 60 percent impaired, limited or restricted Mobility: Walking and Moving Around Goal Status [phone]): At least 20 percent but less than 40 percent impaired, limited or restricted  Kailena Lubas 10/13/2013, 10:10 AM  Physician Documentation Your signature is required to indicate approval of the treatment plan as stated above.  Please sign and either send electronically or make a copy of this report for your files and return this physician signed original.   Please mark one 1.__approve of plan  2. ___approve of plan with the following conditions.   ______________________________                                                          _____________________ Physician Signature                                                                                                             Date

## 2013-10-19 ENCOUNTER — Ambulatory Visit (HOSPITAL_COMMUNITY): Payer: Medicare HMO | Admitting: Physical Therapy

## 2013-10-24 ENCOUNTER — Other Ambulatory Visit: Payer: Self-pay | Admitting: Cardiology

## 2013-10-24 ENCOUNTER — Telehealth: Payer: Self-pay | Admitting: Cardiovascular Disease

## 2013-10-24 MED ORDER — CLOPIDOGREL BISULFATE 75 MG PO TABS: 75.0000 mg | ORAL_TABLET | Freq: Every day | ORAL | Status: DC

## 2013-10-24 MED ORDER — ATORVASTATIN CALCIUM 80 MG PO TABS: 40.0000 mg | ORAL_TABLET | ORAL | Status: DC

## 2013-10-24 NOTE — Telephone Encounter (Signed)
rx refilled.

## 2013-10-24 NOTE — Telephone Encounter (Signed)
Patient needs Clopidogrel and Atorvastatin sent to [facility].  Telephone number is [phone] / tgs

## 2013-10-31 ENCOUNTER — Telehealth: Payer: Self-pay

## 2013-10-31 NOTE — Telephone Encounter (Signed)
Received fax from [facility] requesting information on physician for patient script.  Placed in box for refills.

## 2013-10-31 NOTE — Telephone Encounter (Signed)
Advised that CC RN placed paperwork on desk to be completed by Dr. Purvis Sheffield, will fax once completed

## 2013-11-08 ENCOUNTER — Ambulatory Visit (HOSPITAL_BASED_OUTPATIENT_CLINIC_OR_DEPARTMENT_OTHER): Payer: Medicare HMO

## 2013-11-08 VITALS — BP 133/65 | HR 61 | Temp 97.1°F

## 2013-11-08 DIAGNOSIS — E538 Deficiency of other specified B group vitamins: Secondary | ICD-10-CM

## 2013-11-08 DIAGNOSIS — D649 Anemia, unspecified: Secondary | ICD-10-CM

## 2013-11-08 MED ORDER — CYANOCOBALAMIN 1000 MCG/ML IJ SOLN
1000.0000 ug | Freq: Once | INTRAMUSCULAR | Status: AC
  Administered 2013-11-08: 1000 ug via INTRAMUSCULAR

## 2013-11-08 MED ORDER — CYANOCOBALAMIN 1000 MCG/ML IJ SOLN
INTRAMUSCULAR | Status: AC
  Filled 2013-11-08: qty 1

## 2013-11-18 ENCOUNTER — Emergency Department (HOSPITAL_COMMUNITY)
Admission: EM | Admit: 2013-11-18 | Discharge: 2013-11-18 | Disposition: A | Discharge status: Home / Self Care | Payer: Medicare HMO | Attending: Emergency Medicine | Admitting: Emergency Medicine

## 2013-11-18 ENCOUNTER — Encounter: Payer: Self-pay | Admitting: Internal Medicine

## 2013-11-18 ENCOUNTER — Emergency Department (HOSPITAL_COMMUNITY): Payer: Medicare HMO

## 2013-11-18 ENCOUNTER — Ambulatory Visit (INDEPENDENT_AMBULATORY_CARE_PROVIDER_SITE_OTHER): Payer: Medicare HMO | Admitting: Cardiovascular Disease

## 2013-11-18 ENCOUNTER — Encounter: Payer: Self-pay | Admitting: Cardiovascular Disease

## 2013-11-18 ENCOUNTER — Encounter (HOSPITAL_COMMUNITY): Payer: Self-pay | Admitting: Emergency Medicine

## 2013-11-18 ENCOUNTER — Ambulatory Visit (INDEPENDENT_AMBULATORY_CARE_PROVIDER_SITE_OTHER): Payer: Medicare HMO | Admitting: *Deleted

## 2013-11-18 VITALS — BP 140/72 | HR 69 | Ht 73.0 in

## 2013-11-18 DIAGNOSIS — Z95810 Presence of automatic (implantable) cardiac defibrillator: Secondary | ICD-10-CM

## 2013-11-18 DIAGNOSIS — R5381 Other malaise: Secondary | ICD-10-CM

## 2013-11-18 DIAGNOSIS — Z8719 Personal history of other diseases of the digestive system: Secondary | ICD-10-CM | POA: Insufficient documentation

## 2013-11-18 DIAGNOSIS — I129 Hypertensive chronic kidney disease with stage 1 through stage 4 chronic kidney disease, or unspecified chronic kidney disease: Secondary | ICD-10-CM | POA: Insufficient documentation

## 2013-11-18 DIAGNOSIS — Z7902 Long term (current) use of antithrombotics/antiplatelets: Secondary | ICD-10-CM | POA: Insufficient documentation

## 2013-11-18 DIAGNOSIS — R5383 Other fatigue: Secondary | ICD-10-CM

## 2013-11-18 DIAGNOSIS — Z7982 Long term (current) use of aspirin: Secondary | ICD-10-CM | POA: Insufficient documentation

## 2013-11-18 DIAGNOSIS — H02409 Unspecified ptosis of unspecified eyelid: Secondary | ICD-10-CM | POA: Insufficient documentation

## 2013-11-18 DIAGNOSIS — I2510 Atherosclerotic heart disease of native coronary artery without angina pectoris: Secondary | ICD-10-CM | POA: Insufficient documentation

## 2013-11-18 DIAGNOSIS — N189 Chronic kidney disease, unspecified: Secondary | ICD-10-CM | POA: Insufficient documentation

## 2013-11-18 DIAGNOSIS — I4891 Unspecified atrial fibrillation: Secondary | ICD-10-CM | POA: Insufficient documentation

## 2013-11-18 DIAGNOSIS — Z951 Presence of aortocoronary bypass graft: Secondary | ICD-10-CM | POA: Insufficient documentation

## 2013-11-18 DIAGNOSIS — I2589 Other forms of chronic ischemic heart disease: Secondary | ICD-10-CM

## 2013-11-18 DIAGNOSIS — Z87891 Personal history of nicotine dependence: Secondary | ICD-10-CM | POA: Insufficient documentation

## 2013-11-18 DIAGNOSIS — I5022 Chronic systolic (congestive) heart failure: Secondary | ICD-10-CM | POA: Insufficient documentation

## 2013-11-18 DIAGNOSIS — Z8673 Personal history of transient ischemic attack (TIA), and cerebral infarction without residual deficits: Secondary | ICD-10-CM | POA: Insufficient documentation

## 2013-11-18 DIAGNOSIS — R531 Weakness: Secondary | ICD-10-CM

## 2013-11-18 DIAGNOSIS — M1990 Unspecified osteoarthritis, unspecified site: Secondary | ICD-10-CM | POA: Insufficient documentation

## 2013-11-18 DIAGNOSIS — I252 Old myocardial infarction: Secondary | ICD-10-CM | POA: Insufficient documentation

## 2013-11-18 DIAGNOSIS — M6281 Muscle weakness (generalized): Secondary | ICD-10-CM | POA: Insufficient documentation

## 2013-11-18 DIAGNOSIS — Z862 Personal history of diseases of the blood and blood-forming organs and certain disorders involving the immune mechanism: Secondary | ICD-10-CM | POA: Insufficient documentation

## 2013-11-18 DIAGNOSIS — E119 Type 2 diabetes mellitus without complications: Secondary | ICD-10-CM | POA: Insufficient documentation

## 2013-11-18 LAB — MDC_IDC_ENUM_SESS_TYPE_INCLINIC
Battery Remaining Longevity: 27.6 mo
Brady Statistic RA Percent Paced: 42 %
Brady Statistic RV Percent Paced: 0.02 %
Implantable Pulse Generator Serial Number: 541189
Lead Channel Impedance Value: 312.5 Ohm
Lead Channel Pacing Threshold Amplitude: 0.5 V
Lead Channel Pacing Threshold Amplitude: 0.5 V
Lead Channel Pacing Threshold Amplitude: 1 V
Lead Channel Pacing Threshold Amplitude: 1 V
Lead Channel Pacing Threshold Pulse Width: 0.5 ms
Lead Channel Pacing Threshold Pulse Width: 0.5 ms
Lead Channel Setting Pacing Amplitude: 2 V
Lead Channel Setting Pacing Amplitude: 2.5 V
Lead Channel Setting Pacing Pulse Width: 0.5 ms
Lead Channel Setting Sensing Sensitivity: 0.2 mV
MDC IDC MSMT BATTERY VOLTAGE: 2.56 V
MDC IDC MSMT LEADCHNL RA IMPEDANCE VALUE: 362.5 Ohm
MDC IDC MSMT LEADCHNL RA PACING THRESHOLD PULSEWIDTH: 0.5 ms
MDC IDC MSMT LEADCHNL RA SENSING INTR AMPL: 1 mV
MDC IDC MSMT LEADCHNL RV PACING THRESHOLD PULSEWIDTH: 0.5 ms
MDC IDC MSMT LEADCHNL RV SENSING INTR AMPL: 11.7 mV
MDC IDC SESS DTM: 20150220094646
Zone Setting Detection Interval: 320 ms

## 2013-11-18 LAB — DIFFERENTIAL
BASOS PCT: 0 % (ref 0–1)
Basophils Absolute: 0 10*3/uL (ref 0.0–0.1)
EOS ABS: 0.1 10*3/uL (ref 0.0–0.7)
EOS PCT: 1 % (ref 0–5)
Lymphocytes Relative: 15 % (ref 12–46)
Lymphs Abs: 1.3 10*3/uL (ref 0.7–4.0)
Monocytes Absolute: 0.7 10*3/uL (ref 0.1–1.0)
Monocytes Relative: 8 % (ref 3–12)
NEUTROS PCT: 75 % (ref 43–77)
Neutro Abs: 6.5 10*3/uL (ref 1.7–7.7)

## 2013-11-18 LAB — COMPREHENSIVE METABOLIC PANEL
ALBUMIN: 4 g/dL (ref 3.5–5.2)
ALK PHOS: 98 U/L (ref 39–117)
ALT: 9 U/L (ref 0–53)
AST: 13 U/L (ref 0–37)
BUN: 17 mg/dL (ref 6–23)
CALCIUM: 9.1 mg/dL (ref 8.4–10.5)
CO2: 25 mEq/L (ref 19–32)
Chloride: 99 mEq/L (ref 96–112)
Creatinine, Ser: 0.98 mg/dL (ref 0.50–1.35)
GFR calc Af Amer: >90 mL/min (ref >90)
GFR calc non Af Amer: 84 mL/min — ABNORMAL LOW (ref >90)
Glucose, Bld: 200 mg/dL — ABNORMAL HIGH (ref 70–99)
POTASSIUM: 4.2 meq/L (ref 3.7–5.3)
SODIUM: 138 meq/L (ref 137–147)
Total Bilirubin: 0.4 mg/dL (ref 0.3–1.2)
Total Protein: 7 g/dL (ref 6.0–8.3)

## 2013-11-18 LAB — CBC
HCT: 39.2 % (ref 39.0–52.0)
Hemoglobin: 13.1 g/dL (ref 13.0–17.0)
MCH: 26.8 pg (ref 26.0–34.0)
MCHC: 33.4 g/dL (ref 30.0–36.0)
MCV: 80.3 fL (ref 78.0–100.0)
PLATELETS: 211 10*3/uL (ref 150–400)
RBC: 4.88 MIL/uL (ref 4.22–5.81)
RDW: 14.4 % (ref 11.5–15.5)
WBC: 8.7 10*3/uL (ref 4.0–10.5)

## 2013-11-18 LAB — I-STAT TROPONIN, ED: Troponin i, poc: 0 ng/mL (ref 0.00–0.08)

## 2013-11-18 LAB — APTT: aPTT: 32 seconds (ref 24–37)

## 2013-11-18 LAB — PROTIME-INR
INR: 0.99 (ref 0.00–1.49)
PROTHROMBIN TIME: 12.9 s (ref 11.6–15.2)

## 2013-11-18 LAB — CBG MONITORING, ED: Glucose-Capillary: 192 mg/dL — ABNORMAL HIGH (ref 70–99)

## 2013-11-18 MED ORDER — ASPIRIN 81 MG PO CHEW
324.0000 mg | CHEWABLE_TABLET | Freq: Once | ORAL | Status: AC
  Administered 2013-11-18: 324 mg via ORAL

## 2013-11-18 MED ORDER — TRAMADOL HCL 50 MG PO TABS
50.0000 mg | ORAL_TABLET | Freq: Once | ORAL | Status: AC
  Administered 2013-11-18: 50 mg via ORAL
  Filled 2013-11-18: qty 1

## 2013-11-18 NOTE — Progress Notes (Signed)
ICD check in clinic. Normal device function. Thresholds and sensing consistent with previous device measurements. Impedance trends stable over time. No evidence of any ventricular arrhythmias. No mode switches. Histogram distribution appropriate for patient and level of activity. No changes made this session. Device programmed at appropriate safety margins. Device programmed to optimize intrinsic conduction. Estimated longevity 1.9-2.48yrs. Alert vibration demonstrated for patient, pt knows to call clinic if felt.   ROV w/ Dr. Ladona Ridgel 02/21/14 @ 9:15

## 2013-11-18 NOTE — ED Notes (Signed)
MD at bedside.-neurologist @ bedside Amada Jupiter)

## 2013-11-18 NOTE — ED Notes (Signed)
Notified MD of PT hx and chief complaint.

## 2013-11-18 NOTE — Discharge Instructions (Signed)
Followup the with your doctors next few days. Return for a newer worse symptoms. Neurology has cleared her for discharge home.

## 2013-11-18 NOTE — ED Notes (Signed)
Asked EDP if PT could take his Plavix since MD suggested recent history of CT scan changes. Provider OK'd pt taking his home plavix

## 2013-11-18 NOTE — Progress Notes (Signed)
68 year old gentleman who came in for an ICD check today. His ICD check was within normal limits. However, when he was checking out he complained of right sided weakness and numbness involving the face, right arm, and right leg. He was immediately evaluated. The patient has a history of remote stroke with residual right-sided weakness. However, he states his symptoms today are new compared to her chronic symptoms. The onset was sudden while he was here in the office. His exam reveals an alert, oriented male in no distress. He has minimal weakness appreciated on the right side with 4/5 grip strength.  The patient will be sent to the emergency department via EMS. I spoke with Dr. Pearlean Brownie in neurology. I also spoke with Dr. Deretha Emory in the emergency department. He was given 4 chewable baby aspirin here in the office.  Tonny Bollman 11/18/2013 10:32 AM

## 2013-11-18 NOTE — ED Notes (Signed)
PT reports while at doctor getting his defibrilator checked felt increasing weakness on R side of body. PT reports hx of stroke ~38yrs ago and baseline weaker on R side. PT states he feels a bit better now and has a cardiac hx of multiple stents and open heart CABG with 2-3 grafts. PT even unlabored respirations, calm, cooperative, NAD

## 2013-11-18 NOTE — ED Provider Notes (Addendum)
CSN: [id number]     Arrival date & time 11/18/13  1102 History   First MD Initiated Contact with Patient 11/18/13 1118     Chief Complaint  Patient presents with  . Extremity Weakness    Right sided weakness     (Consider location/radiation/quality/duration/timing/severity/associated sxs/prior Treatment) Patient is a 68 y.o. male presenting with extremity weakness. The history is provided by the patient.  Extremity Weakness Pertinent negatives include no chest pain, no abdominal pain, no headaches and no shortness of breath.   patient from cardiology office. Came in by EMS. The patient a routine visit the cardiologist he was checking his defibrillator. The patient according to cardiologist as well the patient developed a worsening of his right sided body weakness. Patient is stroke 25 years ago does have some baseline weakness on the right. Patient when he arrived here stated that it was improving but still felt heavier on the right side than normal for him. Cardiology on the phone with me stated that from the pacemaker defibrillator standpoint everything checked out okay. The concern was for a TIA or recurrent stroke.  Past Medical History  Diagnosis Date  . Sinus bradycardia   . Tobacco abuse   . Chronic renal insufficiency   . Osteoarthritis   . Diabetes mellitus   . Mitral regurgitation   . Systolic CHF, chronic     Previously taken off lisinopril by primary doctor due to labs  . Carotid stenosis     a. Prior carotid dz/ surgery. b. Carotid dopp 07/2011 - 0-39% bilaterally.;   c. Doppler 11/13: 40-59% RICA, 0-39% LICA  . Cardiomyopathy, ischemic     a. Chronic systolic CHF s/p St. Jude dual chamber ICD 03/2008.  [name] Hx of CABG   . CAD (coronary artery disease)     a. STEMI s/p CABG 07/2007 (had preop cardiogenic shock, IABP, VDRF before surgery)  . Stroke     a. CVA 1990s - chronic pain in RUE after stroke.  [name] PAF (paroxysmal atrial fibrillation)     a. Poor Coumadin candidate  due to hx of GI bleed.  . GI bleed     Reported history  . Splenomegaly   . Myocardial infarction   . Automatic implantable cardioverter-defibrillator in situ   . Hypertension     [facility]   . Sleep apnea     study done, 10 yrs ago, unable to tolerate CPAP  . Neuromuscular disorder     R sided weakness   . Anemia     Multifactorial  - hx of B12 def and iron deficiency, followed [facility] heme[facility]   Past Surgical History  Procedure Laterality Date  . Coronary artery bypass graft    . Percutaneous coronary intervention  01/10/2005    Charlies Constable, MD  . Coronary artery bypass graft  08/29/2007    x3; Salvatore Decent. Cornelius Moras MD  . Cardiac defibrillator placement  04/05/2008    Implantation of a St. Jude dual-chamber defibrillator, Doylene Canning. Ladona Ridgel , MD  . Cardiac defibrillator placement    . Tonsillectomy    . Joint replacement  1990's    L knee  . Total knee arthroplasty Right 08/16/2013    Procedure: RIGHT TOTAL KNEE ARTHROPLASTY;  Surgeon: Kathryne Hitch, MD;  Location: [facility] OR;  Service: Orthopedics;  Laterality: Right;   Family History  Problem Relation Age of Onset  . Heart attack Mother     CVA, MI  . Diabetes Mother   .  Heart attack Father     MI  . Diabetes Sister   . Coronary artery disease Brother     CABG   History  Substance Use Topics  . Smoking status: Former Smoker -- 1.00 packs/day for 15 years    Quit date: 09/29/1990  . Smokeless tobacco: Former Neurosurgeon    Quit date: 08/09/1988  . Alcohol Use: No    Review of Systems  Constitutional: Negative for fever.  HENT: Negative for congestion.   Eyes: Negative for visual disturbance.  Respiratory: Negative for shortness of breath.   Cardiovascular: Negative for chest pain.  Gastrointestinal: Negative for abdominal pain.  Genitourinary: Negative for dysuria.  Musculoskeletal: Positive for extremity weakness. Negative for back pain.  Skin: Negative for rash.  Neurological:  Positive for weakness. Negative for headaches.  Hematological: Does not bruise/bleed easily.  Psychiatric/Behavioral: Negative for confusion.      Allergies  Codeine  Home Medications   Current Outpatient Rx  Name  Route  Sig  Dispense  Refill  . aspirin EC 81 MG EC tablet   Oral   Take 1 tablet (81 mg total) by mouth daily.         [name] atorvastatin (LIPITOR) 80 MG tablet   Oral   Take 0.5 tablets (40 mg total) by mouth every other day.   90 tablet   3   . baclofen (LIORESAL) 20 MG tablet   Oral   Take 40 mg by mouth at bedtime.          . Canagliflozin (INVOKANA) 300 MG TABS   Oral   Take 1 tablet by mouth daily.         . carvedilol (COREG) 12.5 MG tablet   Oral   Take 12.5 mg by mouth 2 (two) times daily with a meal.         . clopidogrel (PLAVIX) 75 MG tablet   Oral   Take 1 tablet (75 mg total) by mouth daily.   90 tablet   3   . furosemide (LASIX) 40 MG tablet   Oral   Take 40 mg by mouth daily as needed.          [name] glimepiride (AMARYL) 4 MG tablet   Oral   Take 4 mg by mouth 2 (two) times daily.          [name] HYDROcodone-acetaminophen (NORCO/VICODIN) 5-325 MG per tablet   Oral   Take 1-2 tablets by mouth every 4 (four) hours as needed for moderate pain (breakthrough pain).   60 tablet   0   . linagliptin (TRADJENTA) 5 MG TABS tablet   Oral   Take 5 mg by mouth daily after breakfast.          . meclizine (ANTIVERT) 25 MG tablet   Oral   Take 25 mg by mouth 4 (four) times daily. For dizziness         . spironolactone (ALDACTONE) 25 MG tablet   Oral   Take 25 mg by mouth every morning.         . nitroGLYCERIN (NITROSTAT) 0.4 MG SL tablet   Sublingual   Place 1 tablet (0.4 mg total) under the tongue every 5 (five) minutes as needed for chest pain.   25 tablet   4    BP 132/59  Pulse 60  Temp(Src) 98.3 F (36.8 C) (Oral)  Resp 20  Ht 6\' 1"  (1.854 m)  Wt 200 lb (90.719 kg)  BMI 26.39 kg/m2  SpO2  97% Physical Exam   Nursing note and vitals reviewed. Constitutional: He is oriented to person, place, and time. He appears well-developed and well-nourished. No distress.  HENT:  Head: Normocephalic and atraumatic.  Mouth/Throat: Oropharynx is clear and moist.  Eyes: Conjunctivae and EOM are normal. Pupils are equal, round, and reactive to light.  Patient perhaps with a slight the right eyelid droop.  Neck: Normal range of motion.  Cardiovascular: Normal rate, regular rhythm and normal heart sounds.   No murmur heard. Pulmonary/Chest: Effort normal and breath sounds normal. No respiratory distress.  Abdominal: Soft. Bowel sounds are normal. There is no tenderness.  Musculoskeletal: Normal range of motion.  Neurological: He is alert and oriented to person, place, and time. No cranial nerve deficit. He exhibits normal muscle tone. Coordination normal.  Patient with weakness on the right side difficult to determine clinically how much of this is new patient says only things new visit extremity just feels heavier.  Skin: Skin is warm.    ED Course  Procedures (including critical care time) Labs Review Labs Reviewed  COMPREHENSIVE METABOLIC PANEL - Abnormal; Notable for the following:    Glucose, Bld 200 (*)    GFR calc non Af Amer 84 (*)    All other components within normal limits  CBG MONITORING, ED - Abnormal; Notable for the following:    Glucose-Capillary 192 (*)    All other components within normal limits  PROTIME-INR  APTT  CBC  DIFFERENTIAL  I-STAT TROPOININ, ED   Results for orders placed during the hospital encounter of 11/18/13  PROTIME-INR      Result Value Ref Range   Prothrombin Time 12.9  11.6 - 15.2 seconds   INR 0.99  0.00 - 1.49  APTT      Result Value Ref Range   aPTT 32  24 - 37 seconds  CBC      Result Value Ref Range   WBC 8.7  4.0 - 10.5 K/uL   RBC 4.88  4.22 - 5.81 MIL/uL   Hemoglobin 13.1  13.0 - 17.0 g/dL   HCT 16.1  09.6 - 04.5 %   MCV 80.3  78.0 - 100.0 fL    MCH 26.8  26.0 - 34.0 pg   MCHC 33.4  30.0 - 36.0 g/dL   RDW 40.9  81.1 - 91.4 %   Platelets 211  150 - 400 K/uL  DIFFERENTIAL      Result Value Ref Range   Neutrophils Relative % 75  43 - 77 %   Neutro Abs 6.5  1.7 - 7.7 K/uL   Lymphocytes Relative 15  12 - 46 %   Lymphs Abs 1.3  0.7 - 4.0 K/uL   Monocytes Relative 8  3 - 12 %   Monocytes Absolute 0.7  0.1 - 1.0 K/uL   Eosinophils Relative 1  0 - 5 %   Eosinophils Absolute 0.1  0.0 - 0.7 K/uL   Basophils Relative 0  0 - 1 %   Basophils Absolute 0.0  0.0 - 0.1 K/uL  COMPREHENSIVE METABOLIC PANEL      Result Value Ref Range   Sodium 138  137 - 147 mEq/L   Potassium 4.2  3.7 - 5.3 mEq/L   Chloride 99  96 - 112 mEq/L   CO2 25  19 - 32 mEq/L   Glucose, Bld 200 (*) 70 - 99 mg/dL   BUN 17  6 - 23 mg/dL   Creatinine, Ser 7.82  0.50 - 1.35  mg/dL   Calcium 9.1  8.4 - 16.110.5 mg/dL   Total Protein 7.0  6.0 - 8.3 g/dL   Albumin 4.0  3.5 - 5.2 g/dL   AST 13  0 - 37 U/L   ALT 9  0 - 53 U/L   Alkaline Phosphatase 98  39 - 117 U/L   Total Bilirubin 0.4  0.3 - 1.2 mg/dL   GFR calc non Af Amer 84 (*) >90 mL/min   GFR calc Af Amer >90  >90 mL/min  CBG MONITORING, ED      Result Value Ref Range   Glucose-Capillary 192 (*) 70 - 99 mg/dL  I-STAT TROPOININ, ED      Result Value Ref Range   Troponin i, poc 0.00  0.00 - 0.08 ng/mL   Comment 3             Imaging Review Ct Head (brain) Wo Contrast  11/18/2013   CLINICAL DATA:  Right-sided weakness and numbness  EXAM: CT HEAD WITHOUT CONTRAST  TECHNIQUE: Contiguous axial images were obtained from the base of the skull through the vertex without intravenous contrast. Study was obtained within 24 hr of patient's arrival at the emergency department.  COMPARISON:  None.  FINDINGS: There is mild diffuse atrophy. There is no mass, hemorrhage, extra-axial fluid collection, or midline shift.  There is decreased attenuation in the medial left temporal lobe, a finding consistent with infarct of uncertain age.  There is patchy small vessel disease in the centra semiovale bilaterally. There is evidence of what appears to be prior infarct in the left thalamus. Gray-white compartments elsewhere are normal.  Bony calvarium appears intact. Mastoid air cells are clear. There is sphenoid sinus disease on the right as well as opacification of several posterior right ethmoid air cells.  IMPRESSION: Age uncertain infarct in the medial left temporal lobe. This infarct may be fairly recent. There is evidence of prior infarct the right thalamus. There is atrophy with mild periventricular small vessel disease. There is no mass effect or hemorrhage. There is paranasal sinus disease.   Electronically Signed   By: Bretta BangWilliam  Woodruff M.D.   On: 11/18/2013 12:07   Dg Chest Port 1 View  11/18/2013   CLINICAL DATA:  Extremity weakness  EXAM: PORTABLE CHEST - 1 VIEW  COMPARISON:  August 09, 2013  FINDINGS: There is no edema or consolidation. Heart is upper normal in size with normal pulmonary vascularity. Pacemaker leads are attached to the right atrium and right ventricle. There are surgical clips in the right cervicothoracic junction region. No pneumothorax. No adenopathy.  IMPRESSION: No edema or consolidation.   Electronically Signed   By: Bretta BangWilliam  Woodruff M.D.   On: 11/18/2013 13:29    EKG Interpretation    Date/Time:  Friday November 18 2013 11:11:21 EST Ventricular Rate:  63 PR Interval:  204 QRS Duration: 102 QT Interval:  433 QTC Calculation: 443 R Axis:   -72 Text Interpretation:  Sinus rhythm Inferior infarct, old Anterolateral infarct, age indeterminate Confirmed by Orvilla Truett  MD, Kyleigh Nannini ([id number]) on 11/18/2013 11:36:24 AM            MDM   Final diagnoses:  CVA (cerebral infarction)    Patient will be seen by neurology. Patient with increased or new right-sided arm and leg heaviness feeling that occurred when he was at his doctors off this the cardiologist. Head CT raises concern for something new in the  temporal area on the left side. The old stroke appeared to involve  the thalamus. Neurology will evaluate concern for new stroke patient not a candidate for MRI due to having the pacemaker defibrillator. Patient was not called a code stroke when he arrived his symptoms were improving and he does have pre-existing right-sided weakness.    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 Patient cleared for discharge home.  Shelda Jakes, MD 11/18/13 [phone]

## 2013-11-18 NOTE — ED Notes (Signed)
EMS reported CBG 120's. PT T2DM and takes PO medication to manage. PT reports he has not taken any of his morning medications today

## 2013-11-18 NOTE — Consult Note (Signed)
Neurology Consultation Reason for Consult: Right-sided pain and weakness Referring Physician: Darlyn ChamberZackowski, S  CC: Right-sided pain  History is obtained from: Patient  HPI: [name] is a 68 y.o. male with a history of previous infarct 30 years ago who presents with an episode of right-sided pain and weakness today. He states that since his infarct, he has had intermittent episodes of pain on the right side and during these episodes he frequently feels a sensation of heaviness when his arm is hurting badly.  Today, he was at his cardiologist who was was investigating his ICD and was manipulating it. Advised him that he would feel something and he felt a vibrating sensation followed by severe pain throughout his right side.  Though he has a history of atrial fibrillation, it has been felt that he has a bad anticoagulation candidate due to GI bleeding.  He currently feels he is completely back to baseline, and feels that this is similar to previous spells that he has had when he has pain on the right side.   ROS: A 14 point ROS was performed and is negative except as noted in the HPI.  Past Medical History  Diagnosis Date  . Sinus bradycardia   . Tobacco abuse   . Chronic renal insufficiency   . Osteoarthritis   . Diabetes mellitus   . Mitral regurgitation   . Systolic CHF, chronic     Previously taken off lisinopril by primary doctor due to labs  . Carotid stenosis     a. Prior carotid dz/ surgery. b. Carotid dopp 07/2011 - 0-39% bilaterally.;   c. Doppler 11/13: 40-59% RICA, 0-39% LICA  . Cardiomyopathy, ischemic     a. Chronic systolic CHF s/p St. Jude dual chamber ICD 03/2008.  [name]. Hx of CABG   . CAD (coronary artery disease)     a. STEMI s/p CABG 07/2007 (had preop cardiogenic shock, IABP, VDRF before surgery)  . Stroke     a. CVA 1990s - chronic pain in RUE after stroke.  [name]. PAF (paroxysmal atrial fibrillation)     a. Poor Coumadin candidate due to hx of GI bleed.  . GI  bleed     Reported history  . Splenomegaly   . Myocardial infarction   . Automatic implantable cardioverter-defibrillator in situ   . Hypertension     [facility]   . Sleep apnea     study done, 10 yrs ago, unable to tolerate CPAP  . Neuromuscular disorder     R sided weakness   . Anemia     Multifactorial  - hx of B12 def and iron deficiency, followed by heme, [facility]    Family History: Heart attack in mother and father  Social History: Tob: Former smoker  Exam: Current vital signs: BP 127/63  Pulse 61  Temp(Src) 98.3 F (36.8 C) (Oral)  Resp 16  Ht 6\' 1"  (1.854 m)  Wt 90.719 kg (200 lb)  BMI 26.39 kg/m2  SpO2 98% Vital signs in last 24 hours: Temp:  [98.3 F (36.8 C)] 98.3 F (36.8 C) (02/20 1247) Pulse Rate:  [60-69] 61 (02/20 1630) Resp:  [9-20] 16 (02/20 1630) BP: (127-153)/(58-72) 127/63 mmHg (02/20 1630) SpO2:  [96 %-99 %] 98 % (02/20 1630) Weight:  [90.719 kg (200 lb)] 90.719 kg (200 lb) (02/20 1118)  General: In bed, NAD CV: Regular rate and rhythm Mental Status: Patient is awake, alert, oriented to person, place, month, year, and situation. Immediate  and remote memory are intact. Patient is able to give a clear and coherent history. No signs of aphasia or neglect Cranial Nerves: II: Visual Fields are full. Pupils are equal, round, and reactive to light.  Discs are difficult to visualize. III,IV, VI: EOMI without ptosis or diploplia.  V: Facial sensation is decreased on the right VII: Facial movement is possible mild right weakness VIII: hearing is intact to voice X: Uvula elevates symmetrically XI: Shoulder shrug is symmetric. XII: tongue is midline without atrophy or fasciculations.  Motor: Tone is increased on the right Bulk is normal. 5/5 strength was present on the left side and right leg. He has a mild spastic weakness of his right arm Sensory: Sensation is decreased throughout the right side Deep Tendon  Reflexes: 2+ and symmetric in the biceps and patellae.  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 He states that he does feel like his right side becomes weaker when his pain becomes very severe and the episode today was typical of these events.   I do not think that this represents a new ischemic event. CT shows old changes, he has no clinical history to suggest new infarct and therefore I do not think that I would pursue this any further at this time.  Recommendations: 1) no changes in management at this time, as I do not suspect that he had an event and has been deemed in the past to be a poor anticoagulation candidate.   Ritta Slot, MD [facility] [phone]  If 7pm- 7am, please page neurology on call at [phone].

## 2013-11-18 NOTE — ED Notes (Signed)
MD at bedside.Zackowski 

## 2013-12-06 ENCOUNTER — Ambulatory Visit (HOSPITAL_BASED_OUTPATIENT_CLINIC_OR_DEPARTMENT_OTHER): Payer: Commercial Managed Care - HMO

## 2013-12-06 VITALS — BP 134/65 | HR 60 | Temp 97.8°F

## 2013-12-06 DIAGNOSIS — E538 Deficiency of other specified B group vitamins: Secondary | ICD-10-CM

## 2013-12-06 DIAGNOSIS — D649 Anemia, unspecified: Secondary | ICD-10-CM

## 2013-12-06 MED ORDER — CYANOCOBALAMIN 1000 MCG/ML IJ SOLN
1000.0000 ug | Freq: Once | INTRAMUSCULAR | Status: AC
  Administered 2013-12-06: 1000 ug via INTRAMUSCULAR

## 2014-01-03 ENCOUNTER — Ambulatory Visit (HOSPITAL_BASED_OUTPATIENT_CLINIC_OR_DEPARTMENT_OTHER): Payer: Medicare HMO

## 2014-01-03 ENCOUNTER — Telehealth: Payer: Self-pay | Admitting: Internal Medicine

## 2014-01-03 ENCOUNTER — Other Ambulatory Visit (HOSPITAL_BASED_OUTPATIENT_CLINIC_OR_DEPARTMENT_OTHER): Payer: Medicare HMO

## 2014-01-03 ENCOUNTER — Ambulatory Visit: Payer: Medicare Other

## 2014-01-03 VITALS — BP 130/58 | HR 61 | Temp 97.2°F

## 2014-01-03 DIAGNOSIS — E538 Deficiency of other specified B group vitamins: Secondary | ICD-10-CM

## 2014-01-03 DIAGNOSIS — D509 Iron deficiency anemia, unspecified: Secondary | ICD-10-CM

## 2014-01-03 DIAGNOSIS — D649 Anemia, unspecified: Secondary | ICD-10-CM

## 2014-01-03 LAB — CBC WITH DIFFERENTIAL/PLATELET
BASO%: 0.3 % (ref 0.0–2.0)
BASOS ABS: 0 10*3/uL (ref 0.0–0.1)
EOS ABS: 0.2 10*3/uL (ref 0.0–0.5)
EOS%: 2.8 % (ref 0.0–7.0)
HCT: 39.2 % (ref 38.4–49.9)
HEMOGLOBIN: 12.5 g/dL — AB (ref 13.0–17.1)
LYMPH#: 1.7 10*3/uL (ref 0.9–3.3)
LYMPH%: 22.8 % (ref 14.0–49.0)
MCH: 25.5 pg — ABNORMAL LOW (ref 27.2–33.4)
MCHC: 32 g/dL (ref 32.0–36.0)
MCV: 79.8 fL (ref 79.3–98.0)
MONO#: 0.8 10*3/uL (ref 0.1–0.9)
MONO%: 10.3 % (ref 0.0–14.0)
NEUT#: 4.6 10*3/uL (ref 1.5–6.5)
NEUT%: 63.8 % (ref 39.0–75.0)
Platelets: 195 10*3/uL (ref 140–400)
RBC: 4.91 10*6/uL (ref 4.20–5.82)
RDW: 18.3 % — ABNORMAL HIGH (ref 11.0–14.6)
WBC: 7.3 10*3/uL (ref 4.0–10.3)

## 2014-01-03 LAB — IRON AND TIBC CHCC
%SAT: 13 % — AB (ref 20–55)
Iron: 49 ug/dL (ref 42–163)
TIBC: 385 ug/dL (ref 202–409)
UIBC: 336 ug/dL (ref 117–376)

## 2014-01-03 LAB — COMPREHENSIVE METABOLIC PANEL (CC13)
ALBUMIN: 4 g/dL (ref 3.5–5.0)
ALK PHOS: 96 U/L (ref 40–150)
ALT: 8 U/L (ref 0–55)
ANION GAP: 8 meq/L (ref 3–11)
AST: 13 U/L (ref 5–34)
BUN: 17.8 mg/dL (ref 7.0–26.0)
CHLORIDE: 109 meq/L (ref 98–109)
CO2: 25 meq/L (ref 22–29)
Calcium: 9.2 mg/dL (ref 8.4–10.4)
Creatinine: 1.2 mg/dL (ref 0.7–1.3)
GLUCOSE: 258 mg/dL — AB (ref 70–140)
POTASSIUM: 4.1 meq/L (ref 3.5–5.1)
SODIUM: 142 meq/L (ref 136–145)
TOTAL PROTEIN: 7 g/dL (ref 6.4–8.3)
Total Bilirubin: 0.48 mg/dL (ref 0.20–1.20)

## 2014-01-03 LAB — FERRITIN CHCC: Ferritin: 25 ng/ml (ref 22–316)

## 2014-01-03 MED ORDER — CYANOCOBALAMIN 1000 MCG/ML IJ SOLN
1000.0000 ug | Freq: Once | INTRAMUSCULAR | Status: AC
  Administered 2014-01-03: 1000 ug via INTRAMUSCULAR

## 2014-01-03 NOTE — Telephone Encounter (Signed)
Pt came by and r/s appt from today to tomorrow, lab and  injections were done today

## 2014-01-04 ENCOUNTER — Telehealth: Payer: Self-pay | Admitting: Internal Medicine

## 2014-01-04 ENCOUNTER — Ambulatory Visit (HOSPITAL_BASED_OUTPATIENT_CLINIC_OR_DEPARTMENT_OTHER): Payer: Medicare HMO | Admitting: Internal Medicine

## 2014-01-04 VITALS — BP 151/72 | HR 62 | Temp 97.5°F | Resp 18 | Ht 73.0 in | Wt 212.5 lb

## 2014-01-04 DIAGNOSIS — D509 Iron deficiency anemia, unspecified: Secondary | ICD-10-CM

## 2014-01-04 DIAGNOSIS — N189 Chronic kidney disease, unspecified: Secondary | ICD-10-CM

## 2014-01-04 NOTE — Telephone Encounter (Signed)
gv and printed aptp sched and avs for pt for May thru OCT....sed added tx.

## 2014-01-05 ENCOUNTER — Other Ambulatory Visit: Payer: Self-pay

## 2014-01-05 ENCOUNTER — Other Ambulatory Visit: Payer: Self-pay | Admitting: *Deleted

## 2014-01-06 ENCOUNTER — Ambulatory Visit (HOSPITAL_BASED_OUTPATIENT_CLINIC_OR_DEPARTMENT_OTHER): Payer: Medicare HMO

## 2014-01-06 VITALS — BP 132/64 | HR 60 | Temp 96.9°F

## 2014-01-06 DIAGNOSIS — D509 Iron deficiency anemia, unspecified: Secondary | ICD-10-CM

## 2014-01-06 MED ORDER — SODIUM CHLORIDE 0.9 % IV SOLN
510.0000 mg | Freq: Once | INTRAVENOUS | Status: AC
  Administered 2014-01-06: 510 mg via INTRAVENOUS
  Filled 2014-01-06: qty 17

## 2014-01-06 NOTE — Progress Notes (Signed)
[name] OFFICE PROGRESS NOTE  [name] Jester, DO [address] [place] [address]  DIAGNOSIS: ANEMIA, IRON DEFICIENCY - Plan: CBC with Differential, Lactate dehydrogenase (LDH) - [facility], Ferritin, Iron and TIBC, Basic metabolic panel (Bmet) - [facility], Vitamin B12  RENAL INSUFFICIENCY, CHRONIC  Chief Complaint  Patient presents with  . Follow-up    CURRENT THERAPY: Monthly Vitamin B-12 1000 mcg IM injection. These injections were restarted on 08/05/2012.  INTERVAL HISTORY: [name] 68 y.o. male with a history of multiple co-morbidities including CAD s/p CABG, chronic systolic heart failure s/p ICD, CKD, diabetes mellitus, HTN, hyperlipidemia, GI bleeds, paroxysmal atrial fibrillation (deemed to be a poor anticoagulation candidate), CVA s/p righted side weakness, and carotid artery disease (s/p right CEA) is here for follow-up for his anemia.  He was last seen by me on 07/19/2013.  Today, he is without complaints.  He denies chest pain, fever or chills or acute shortness of breath.      MEDICAL HISTORY: Past Medical History  Diagnosis Date  . Sinus bradycardia   . Tobacco abuse   . Chronic renal insufficiency   . Osteoarthritis   . Diabetes mellitus   . Mitral regurgitation   . Systolic CHF, chronic     Previously taken off lisinopril by primary doctor due to labs  . Carotid stenosis     a. Prior carotid dz/ surgery. b. Carotid dopp 07/2011 - 0-39% bilaterally.;   c. Doppler 11/13: 40-59% RICA, 0-39% LICA  . Cardiomyopathy, ischemic     a. Chronic systolic CHF s/p St. Jude dual chamber ICD 03/2008.  [name] Hx of CABG   . CAD (coronary artery disease)     a. STEMI s/p CABG 07/2007 (had preop cardiogenic shock, IABP, VDRF before surgery)  . Stroke     a. CVA 1990s - chronic pain in RUE after stroke.  [name] PAF (paroxysmal atrial fibrillation)     a. Poor Coumadin candidate due to hx of GI bleed.  . GI bleed     Reported history  . Splenomegaly   . Myocardial  infarction   . Automatic implantable cardioverter-defibrillator in situ   . Hypertension     [facility]   . Sleep apnea     study done, 10 yrs ago, unable to tolerate CPAP  . Neuromuscular disorder     R sided weakness   . Anemia     Multifactorial  - hx of B12 def and iron deficiency, followed by heme[name]   INTERIM HISTORY: has DM (diabetes mellitus), secondary, with peripheral vascular complications; ANEMIA, IRON DEFICIENCY; HYPERTENSION; CARDIOMYOPATHY, ISCHEMIC; CEREBROVASCULAR DISEASE; PVD; RENAL INSUFFICIENCY, CHRONIC; OSTEOARTHRITIS; AUTOMATIC IMPLANTABLE CARDIAC DEFIBRILLATOR SITU; CORONARY ARTERY BYPASS GRAFT, HX OF; Cholecystitis with cholelithiasis; Arthritis of knee, right; Right knee pain; Abnormality of gait; and Stiffness of right knee on his problem list.    ALLERGIES:  is allergic to codeine.  MEDICATIONS: has a current medication list which includes the following prescription(s): aspirin, atorvastatin, baclofen, bisacodyl, canagliflozin, carvedilol, clopidogrel, furosemide, glimepiride, hydrocodone-acetaminophen, linagliptin, meclizine, menthol-methyl salicylate, nitroglycerin, ranitidine hcl, spironolactone, and tramadol.  SURGICAL HISTORY:  Past Surgical History  Procedure Laterality Date  . Coronary artery bypass graft    . Percutaneous coronary intervention  01/10/2005    Charlies Constable, MD  . Coronary artery bypass graft  08/29/2007    x3; Salvatore Decent. Cornelius Moras MD  . Cardiac defibrillator placement  04/05/2008    Implantation of a St. Jude dual-chamber defibrillator, Sharlot Gowda  Myna HidalgoW. Taylor , MD  . Cardiac defibrillator placement    . Tonsillectomy    . Joint replacement  1990's    L knee  . Total knee arthroplasty Right 08/16/2013    Procedure: RIGHT TOTAL KNEE ARTHROPLASTY;  Surgeon: Kathryne Hitchhristopher Y Blackman, MD;  [facility];  Service: Orthopedics;  Laterality: Right;   PROBLEM LIST:  1. History of iron-deficiency anemia.  2.  History of vitamin B12 deficiency, currently on monthly vitamin B12 shots after patient expressed intolerance to oral vitamin B12.  3. Renal insufficiency.  4. Severe 3-vessel coronary artery disease status post coronary artery bypass graft on 08/29/2007. History of myocardial infarction  08/11/2007. Right dual chamber implantable cardioverter  defibrillator placed 04/05/2008.  5. Ischemic cardiomyopathy.  6. Hypertension.  7. Dyslipidemia.  8. Diabetes mellitus type 2.  9. 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 The patient is status post left carotid  endarterectomy.  10. Left total knee replacement at age 68.  11. History of splenomegaly seen on a CT scan from 08/14/2007 and 08/25/2007.   REVIEW OF SYSTEMS:   Constitutional: Denies fevers, chills or abnormal weight loss Eyes: Denies blurriness of vision Ears, nose, mouth, throat, and face: Denies mucositis or sore throat Respiratory: Denies cough, dyspnea or wheezes Cardiovascular: Denies palpitation, chest discomfort or lower extremity swelling Gastrointestinal:  Denies nausea, heartburn or change in bowel habits Skin: Denies abnormal skin rashes Lymphatics: Denies new lymphadenopathy or easy bruising Neurological:Denies numbness, tingling or new weaknesses Behavioral/Psych: Mood is stable, no new changes  All other systems were reviewed with the patient and are negative.  PHYSICAL EXAMINATION: ECOG PERFORMANCE STATUS: 1 - Symptomatic but completely ambulatory  Blood pressure 151/72, pulse 62, temperature 97.5 F (36.4 C), temperature source Oral, resp. rate 18, height 6\' 1"  (1.854 m), weight 212 lb 8 oz (96.389 kg), SpO2 98.00%.  GENERAL:alert, no distress and comfortable;  SKIN: skin color, texture, turgor are normal, no rashes or significant lesions EYES: normal, Conjunctiva are pink and non-injected, sclera clear OROPHARYNX:no exudate, no erythema and lips, buccal mucosa, and tongue  normal ; poor dentition with many missing teeth.  NECK: supple, thyroid normal size, non-tender, without nodularity; Scars well healed on right neck area.   LYMPH:  no palpable lymphadenopathy in the cervical, axillary or supraclavicular LUNGS: clear to auscultation and percussion with normal breathing effort HEART: regular rate & rhythm and no murmurs and no lower extremity edema;+ Implanted ICD on the right infraclavicular area.   ABDOMEN:abdomen soft, non-tender and normal bowel sounds Musculoskeletal:no cyanosis of digits and no clubbing  NEURO: alert & oriented x 3 with fluent speech, R side weakness with a cautious gait.    LABORATORY DATA: No results found for this or any previous visit (from the past 48 hour(s)).  Labs:  Lab Results  Component Value Date   WBC 7.3 01/03/2014   HGB 12.5* 01/03/2014   HCT 39.2 01/03/2014   MCV 79.8 01/03/2014   PLT 195 01/03/2014   NEUTROABS 4.6 01/03/2014      Chemistry      Component Value Date/Time   NA 142 01/03/2014 1120   NA 138 11/18/2013 1143   K 4.1 01/03/2014 1120   K 4.2 11/18/2013 1143   CL 99 11/18/2013 1143   CL 100 01/06/2013 0830   CO2 25 01/03/2014 1120   CO2 25 11/18/2013 1143   BUN 17.8 01/03/2014 1120   BUN 17 11/18/2013 1143   CREATININE 1.2 01/03/2014 1120  CREATININE 0.98 11/18/2013 1143      Component Value Date/Time   CALCIUM 9.2 01/03/2014 1120   CALCIUM 9.1 11/18/2013 1143   ALKPHOS 96 01/03/2014 1120   ALKPHOS 98 11/18/2013 1143   AST 13 01/03/2014 1120   AST 13 11/18/2013 1143   ALT 8 01/03/2014 1120   ALT 9 11/18/2013 1143   BILITOT 0.48 01/03/2014 1120   BILITOT 0.4 11/18/2013 1143     Basic Metabolic Panel:  Recent Labs Lab 01/03/14 1120  NA 142  K 4.1  CO2 25  GLUCOSE 258*  BUN 17.8  CREATININE 1.2  CALCIUM 9.2   GFR Estimated Creatinine Clearance: 74.1 ml/min (by C-G formula based on Cr of 1.2). Liver Function Tests:  Recent Labs Lab 01/03/14 1120  AST 13  ALT 8  ALKPHOS 96  BILITOT 0.48  PROT 7.0  ALBUMIN 4.0    CBC:  Recent Labs Lab 01/03/14 1120  WBC 7.3  NEUTROABS 4.6  HGB 12.5*  HCT 39.2  MCV 79.8  PLT 195   Anemia work up  Recent Labs  01/03/14 1120  FERRITIN 25  TIBC 385  IRON 49   Studies:  No results found.   RADIOGRAPHIC STUDIES: No results found.  ASSESSMENT: [name] 68 y.o. male with a history of ANEMIA, IRON DEFICIENCY - Plan: CBC with Differential, Lactate dehydrogenase (LDH) - [facility], Ferritin, Iron and TIBC, Basic metabolic panel (Bmet) - [facility], Vitamin B12  RENAL INSUFFICIENCY, CHRONIC   PLAN:  1. Anemia. --He continues to well from a hematological standpoint.  He comes here for a monthly vitamin B12 shot and will receive one today.  His iron studies appear to be decreasing and we will set him up for feraheme 510 mg in the next few days. His hemoglobin is 12.6. [name]  We will plan to see Mr. Sawinski again in 6 months at time we will check his CBC, chemistries, vitamin B12 level and iron studies.   All questions were answered. The patient knows to call the clinic with any problems, questions or concerns. We can certainly see the patient much sooner if necessary.  I spent 10 minutes counseling the patient face to face. The total time spent in the appointment was 15 minutes.    Myra Rude, MD 01/06/2014 4:02 AM

## 2014-01-06 NOTE — Patient Instructions (Signed)

## 2014-01-31 ENCOUNTER — Ambulatory Visit (HOSPITAL_BASED_OUTPATIENT_CLINIC_OR_DEPARTMENT_OTHER): Payer: Medicare HMO

## 2014-01-31 VITALS — BP 141/69 | HR 60 | Temp 97.3°F

## 2014-01-31 DIAGNOSIS — D649 Anemia, unspecified: Secondary | ICD-10-CM

## 2014-01-31 DIAGNOSIS — E538 Deficiency of other specified B group vitamins: Secondary | ICD-10-CM

## 2014-01-31 MED ORDER — CYANOCOBALAMIN 1000 MCG/ML IJ SOLN
1000.0000 ug | Freq: Once | INTRAMUSCULAR | Status: AC
  Administered 2014-01-31: 1000 ug via INTRAMUSCULAR

## 2014-02-21 ENCOUNTER — Encounter: Payer: Self-pay | Admitting: Internal Medicine

## 2014-02-21 ENCOUNTER — Ambulatory Visit (INDEPENDENT_AMBULATORY_CARE_PROVIDER_SITE_OTHER): Payer: Medicare HMO | Admitting: Internal Medicine

## 2014-02-21 VITALS — BP 137/67 | HR 59 | Ht 74.0 in | Wt 205.0 lb

## 2014-02-21 DIAGNOSIS — I2589 Other forms of chronic ischemic heart disease: Secondary | ICD-10-CM

## 2014-02-21 DIAGNOSIS — Z95810 Presence of automatic (implantable) cardiac defibrillator: Secondary | ICD-10-CM

## 2014-02-21 LAB — MDC_IDC_ENUM_SESS_TYPE_INCLINIC
Brady Statistic RV Percent Paced: 0.26 %
HighPow Impedance: 68.625
Implantable Pulse Generator Serial Number: 541189
Lead Channel Impedance Value: 300 Ohm
Lead Channel Impedance Value: 337.5 Ohm
Lead Channel Pacing Threshold Amplitude: 1 V
Lead Channel Pacing Threshold Amplitude: 1 V
Lead Channel Pacing Threshold Pulse Width: 0.5 ms
Lead Channel Pacing Threshold Pulse Width: 0.5 ms
Lead Channel Pacing Threshold Pulse Width: 0.5 ms
Lead Channel Sensing Intrinsic Amplitude: 11.7 mV
Lead Channel Sensing Intrinsic Amplitude: 2.8 mV
Lead Channel Setting Pacing Amplitude: 2 V
Lead Channel Setting Pacing Pulse Width: 0.5 ms
MDC IDC MSMT BATTERY REMAINING LONGEVITY: 26.4 mo
MDC IDC MSMT BATTERY VOLTAGE: 2.56 V
MDC IDC MSMT LEADCHNL RA PACING THRESHOLD AMPLITUDE: 0.5 V
MDC IDC MSMT LEADCHNL RA PACING THRESHOLD AMPLITUDE: 0.5 V
MDC IDC MSMT LEADCHNL RA PACING THRESHOLD PULSEWIDTH: 0.5 ms
MDC IDC SESS DTM: 20150526105452
MDC IDC SET LEADCHNL RV PACING AMPLITUDE: 2.5 V
MDC IDC SET LEADCHNL RV SENSING SENSITIVITY: 0.2 mV
MDC IDC STAT BRADY RA PERCENT PACED: 59 %
Zone Setting Detection Interval: 320 ms

## 2014-02-21 NOTE — Assessment & Plan Note (Signed)
His St. Jude DDD ICD is working normally with approximately 2 years of battery longevity.

## 2014-02-21 NOTE — Progress Notes (Signed)
HPI [name] returns today for followup. He is a very pleasant 68 year old man with an ischemic cardiomyopathy, chronic systolic heart failure, status post ICD implantation. In the interim, he has been stable. He denies chest pain or shortness of breath. He has undergone knee replacement surgery but still has problems with arthritis. His knee pain is improved.  He denies syncope or near-syncope. No chest pain. Allergies  Allergen Reactions  . Codeine     REACTION: G I  distress     Current Outpatient Prescriptions  Medication Sig Dispense Refill  . aspirin EC 81 MG EC tablet Take 1 tablet (81 mg total) by mouth daily.      [name] atorvastatin (LIPITOR) 80 MG tablet Take 0.5 tablets (40 mg total) by mouth every other day.  90 tablet  3  . baclofen (LIORESAL) 20 MG tablet Take 40 mg by mouth at bedtime.       [name] BISACODYL PO Take 1 tablet by mouth daily as needed.      . calcium carbonate (TUMS - DOSED IN MG ELEMENTAL CALCIUM) 500 MG chewable tablet Chew 1 tablet by mouth as needed.      . Canagliflozin (INVOKANA) 300 MG TABS Take 1 tablet by mouth daily.      . carvedilol (COREG) 12.5 MG tablet Take 12.5 mg by mouth 2 (two) times daily with a meal.      . clopidogrel (PLAVIX) 75 MG tablet Take 1 tablet (75 mg total) by mouth daily.  90 tablet  3  . clotrimazole (LOTRIMIN) 1 % cream Apply 1 application topically as needed.      . furosemide (LASIX) 40 MG tablet Take 40 mg by mouth daily as needed.       [name] glimepiride (AMARYL) 4 MG tablet Take 4 mg by mouth 2 (two) times daily.       [name] HYDROcodone-acetaminophen (NORCO/VICODIN) 5-325 MG per tablet Take 1-2 tablets by mouth every 4 (four) hours as needed for moderate pain (breakthrough pain).  60 tablet  0  . linagliptin (TRADJENTA) 5 MG TABS tablet Take 5 mg by mouth daily after breakfast.       . meclizine (ANTIVERT) 25 MG tablet Take 25 mg by mouth 4 (four) times daily as needed. For dizziness      . Menthol-Methyl Salicylate (MUSCLE RUB EX) Apply  1 application topically as needed.       . nitroGLYCERIN (NITROSTAT) 0.4 MG SL tablet Place 1 tablet (0.4 mg total) under the tongue every 5 (five) minutes as needed for chest pain.  25 tablet  4  . oxyCODONE-acetaminophen (PERCOCET) 10-325 MG per tablet Take 1 tablet by mouth every 6 (six) hours.      [name] RANITIDINE HCL PO Take 1 tablet by mouth daily as needed.      . SENNA CO 1 tablet as needed.      [name] spironolactone (ALDACTONE) 25 MG tablet Take 25 mg by mouth every morning.      . traMADol (ULTRAM) 50 MG tablet Take 50 mg by mouth 3 (three) times daily as needed.       No current facility-administered medications for this visit.     Past Medical History  Diagnosis Date  . Sinus bradycardia   . Tobacco abuse   . Chronic renal insufficiency   . Osteoarthritis   . Diabetes mellitus   . Mitral regurgitation   . Systolic CHF, chronic     Previously taken off lisinopril by primary doctor due to  labs  . Carotid stenosis     a. Prior carotid dz/ surgery. b. Carotid dopp 07/2011 - 0-39% bilaterally.;   c. Doppler 11/13: 40-59% RICA, 0-39% LICA  . Cardiomyopathy, ischemic     a. Chronic systolic CHF s/p St. Jude dual chamber ICD 03/2008.  [name]. Hx of CABG   . CAD (coronary artery disease)     a. STEMI s/p CABG 07/2007 (had preop cardiogenic shock, IABP, VDRF before surgery)  . Stroke     a. CVA 1990s - chronic pain in RUE after stroke.  [name]. PAF (paroxysmal atrial fibrillation)     a. Poor Coumadin candidate due to hx of GI bleed.  . GI bleed     Reported history  . Splenomegaly   . Myocardial infarction   . Automatic implantable cardioverter-defibrillator in situ   . Hypertension     [facility]   . Sleep apnea     study done, 10 yrs ago, unable to tolerate CPAP  . Neuromuscular disorder     R sided weakness   . Anemia     Multifactorial  - hx of B12 def and iron deficiency, followed by heme, [facility]    ROS:   All systems reviewed and negative  except as noted in the HPI.   Past Surgical History  Procedure Laterality Date  . Coronary artery bypass graft    . Percutaneous coronary intervention  01/10/2005    Charlies ConstableBruce Brodie, MD  . Coronary artery bypass graft  08/29/2007    x3; Salvatore Decentlarence H. Cornelius Moraswen MD  . Cardiac defibrillator placement  04/05/2008    Implantation of a St. Jude dual-chamber defibrillator, Doylene CanningGregg W. Ladona Ridgelaylor , MD  . Cardiac defibrillator placement    . Tonsillectomy    . Joint replacement  1990's    L knee  . Total knee arthroplasty Right 08/16/2013    Procedure: RIGHT TOTAL KNEE ARTHROPLASTY;  Surgeon: Kathryne Hitchhristopher Y Blackman, MD;  [facility];  Service: Orthopedics;  Laterality: Right;     Family History  Problem Relation Age of Onset  . Heart attack Mother     CVA, MI  . Diabetes Mother   . Heart attack Father     MI  . Diabetes Sister   . Coronary artery disease Brother     CABG     History   Social History  . Marital Status: Married    Spouse Name: N/A    Number of Children: N/A  . Years of Education: N/A   Occupational History  . Retired    Social History Main Topics  . Smoking status: Former Smoker -- 1.00 packs/day for 15 years    Quit date: 09/29/1990  . Smokeless tobacco: Former NeurosurgeonUser    Quit date: 08/09/1988  . Alcohol Use: No  . Drug Use: No  . Sexual Activity: Not on file   Other Topics Concern  . Not on file   Social History Narrative   [place] in [place]   Been on disability since his stroke in the 1990s   Not routinely exercising     BP 137/67  Pulse 59  Ht 6\' 2"  (1.88 m)  Wt 205 lb (92.987 kg)  BMI 26.31 kg/m2  Physical Exam:  stable appearing 68 year old man, NAD HEENT: Unremarkable Neck:  7 cm JVD, no thyromegally Back:  No CVA tenderness Lungs:  Clear with no wheezes, rales, or rhonchi. HEART:  Regular rate rhythm, no murmurs, no rubs, no  clicks Abd:  soft, positive bowel sounds, no organomegally, no rebound, no guarding Ext:  2 plus pulses,  no edema, no cyanosis, no clubbing, right knee with chronic arthritic changes Skin:  No rashes no nodules Neuro:  CN II through XII intact, motor grossly intact  DEVICE  Normal device function.  See PaceArt for details.   Assess/Plan:

## 2014-02-21 NOTE — Patient Instructions (Signed)
Your physician wants you to follow-up in: 12 months with Dr Court Joy will receive a reminder letter in the mail two months in advance. If you don't receive a letter, please call our office to schedule the follow-up appointment.   Remote monitoring is used to monitor your Pacemaker or ICD from home. This monitoring reduces the number of office visits required to check your device to one time per year. It allows [place] to keep an eye on the functioning of your device to ensure it is working properly. You are scheduled for a device check from home on 05/25/14. You may send your transmission at any time that day. If you have a wireless device, the transmission will be sent automatically. After your physician reviews your transmission, you will receive a postcard with your next transmission date.

## 2014-02-21 NOTE — Assessment & Plan Note (Signed)
He remains fairly active despite his multitude of arthritic complaints. He has been encouraged to increase his physical activity and he will continue his current meds.

## 2014-02-22 NOTE — Addendum Note (Signed)
Encounter addended by: Bella Kennedy, PT on: 02/22/2014 11:38 AM<BR>     Documentation filed: Notes Section, Episodes

## 2014-02-22 NOTE — Progress Notes (Signed)
  Patient Details  Name: IVIS BERENDS MRN: [id number] Date of Birth: 05-28-1946  Today's Date: 02/22/2014 Pt seen one time only on 10/13/2013 and did not return for further visits will discharge pt.   Bella Kennedy 02/22/2014, 11:37 AM

## 2014-02-27 ENCOUNTER — Telehealth: Payer: Self-pay | Admitting: *Deleted

## 2014-02-27 ENCOUNTER — Other Ambulatory Visit: Payer: Self-pay

## 2014-02-27 MED ORDER — SPIRONOLACTONE 25 MG PO TABS: 25.0000 mg | ORAL_TABLET | Freq: Every morning | ORAL | Status: DC

## 2014-02-27 NOTE — Telephone Encounter (Signed)
Pt needs Rx called in for spironolactone to [facility] fax [phone]

## 2014-02-28 ENCOUNTER — Ambulatory Visit: Payer: Medicare HMO

## 2014-03-01 ENCOUNTER — Telehealth: Payer: Self-pay | Admitting: *Deleted

## 2014-03-01 ENCOUNTER — Telehealth: Payer: Self-pay | Admitting: Internal Medicine

## 2014-03-01 NOTE — Telephone Encounter (Signed)
Called patient about missed appointment 02/28/14.  Left message to call back and reschedule appointment.

## 2014-03-01 NOTE — Telephone Encounter (Signed)
PT CALLED TO R/S MISSED APPT...DONE...PT AWARE OF NEW D.T

## 2014-03-02 ENCOUNTER — Ambulatory Visit (HOSPITAL_BASED_OUTPATIENT_CLINIC_OR_DEPARTMENT_OTHER): Payer: Medicare HMO

## 2014-03-02 VITALS — BP 151/74 | HR 60 | Temp 97.4°F | Resp 20

## 2014-03-02 DIAGNOSIS — N039 Chronic nephritic syndrome with unspecified morphologic changes: Secondary | ICD-10-CM

## 2014-03-02 DIAGNOSIS — N189 Chronic kidney disease, unspecified: Secondary | ICD-10-CM

## 2014-03-02 DIAGNOSIS — D631 Anemia in chronic kidney disease: Secondary | ICD-10-CM

## 2014-03-02 DIAGNOSIS — D649 Anemia, unspecified: Secondary | ICD-10-CM

## 2014-03-02 MED ORDER — CYANOCOBALAMIN 1000 MCG/ML IJ SOLN
1000.0000 ug | Freq: Once | INTRAMUSCULAR | Status: AC
  Administered 2014-03-02: 1000 ug via INTRAMUSCULAR

## 2014-03-02 NOTE — Patient Instructions (Signed)
Cyanocobalamin, Vitamin B12 injection °What is this medicine? °CYANOCOBALAMIN (sye an oh koe BAL a min) is a man made form of vitamin B12. Vitamin B12 is used in the growth of healthy blood cells, nerve cells, and proteins in the body. It also helps with the metabolism of fats and carbohydrates. This medicine is used to treat people who can not absorb vitamin B12. °This medicine may be used for other purposes; ask your health care provider or pharmacist if you have questions. °COMMON BRAND NAME(S): Cyomin, LA-12 , Nutri-Twelve , Primabalt °What should I tell my health care provider before I take this medicine? °They need to know if you have any of these conditions: °-kidney disease °-Leber's disease °-megaloblastic anemia °-an unusual or allergic reaction to cyanocobalamin, cobalt, other medicines, foods, dyes, or preservatives °-pregnant or trying to get pregnant °-breast-feeding °How should I use this medicine? °This medicine is injected into a muscle or deeply under the skin. It is usually given by a health care professional in a clinic or doctor's office. However, your doctor may teach you how to inject yourself. Follow all instructions. °Talk to your pediatrician regarding the use of this medicine in children. Special care may be needed. °Overdosage: If you think you have taken too much of this medicine contact a poison control center or emergency room at once. °NOTE: This medicine is only for you. Do not share this medicine with others. °What if I miss a dose? °If you are given your dose at a clinic or doctor's office, call to reschedule your appointment. If you give your own injections and you miss a dose, take it as soon as you can. If it is almost time for your next dose, take only that dose. Do not take double or extra doses. °What may interact with this medicine? °-colchicine °-heavy alcohol intake °This list may not describe all possible interactions. Give your health care provider a list of all the  medicines, herbs, non-prescription drugs, or dietary supplements you use. Also tell them if you smoke, drink alcohol, or use illegal drugs. Some items may interact with your medicine. °What should I watch for while using this medicine? °Visit your doctor or health care professional regularly. You may need blood work done while you are taking this medicine. °You may need to follow a special diet. Talk to your doctor. Limit your alcohol intake and avoid smoking to get the best benefit. °What side effects may I notice from receiving this medicine? °Side effects that you should report to your doctor or health care professional as soon as possible: °-allergic reactions like skin rash, itching or hives, swelling of the face, lips, or tongue °-blue tint to skin °-chest tightness, pain °-difficulty breathing, wheezing °-dizziness °-red, swollen painful area on the leg °Side effects that usually do not require medical attention (report to your doctor or health care professional if they continue or are bothersome): °-diarrhea °-headache °This list may not describe all possible side effects. Call your doctor for medical advice about side effects. You may report side effects to FDA at 1-800-FDA-1088. °Where should I keep my medicine? °Keep out of the reach of children. °Store at room temperature between 15 and 30 degrees C (59 and 85 degrees F). Protect from light. Throw away any unused medicine after the expiration date. °NOTE: This sheet is a summary. It may not cover all possible information. If you have questions about this medicine, talk to your doctor, pharmacist, or health care provider. °© 2014, [employer]. (2007-12-27   22:10:20) ° °

## 2014-03-28 ENCOUNTER — Ambulatory Visit (HOSPITAL_BASED_OUTPATIENT_CLINIC_OR_DEPARTMENT_OTHER): Payer: Medicare HMO

## 2014-03-28 VITALS — BP 141/68 | HR 60 | Temp 97.6°F

## 2014-03-28 DIAGNOSIS — D649 Anemia, unspecified: Secondary | ICD-10-CM

## 2014-03-28 DIAGNOSIS — E538 Deficiency of other specified B group vitamins: Secondary | ICD-10-CM

## 2014-03-28 MED ORDER — CYANOCOBALAMIN 1000 MCG/ML IJ SOLN
1000.0000 ug | Freq: Once | INTRAMUSCULAR | Status: AC
  Administered 2014-03-28: 1000 ug via INTRAMUSCULAR

## 2014-04-24 ENCOUNTER — Ambulatory Visit (HOSPITAL_BASED_OUTPATIENT_CLINIC_OR_DEPARTMENT_OTHER): Payer: Medicare HMO

## 2014-04-24 VITALS — BP 135/71 | HR 59 | Temp 97.6°F

## 2014-04-24 DIAGNOSIS — E538 Deficiency of other specified B group vitamins: Secondary | ICD-10-CM

## 2014-04-24 DIAGNOSIS — D649 Anemia, unspecified: Secondary | ICD-10-CM

## 2014-04-24 MED ORDER — CYANOCOBALAMIN 1000 MCG/ML IJ SOLN
1000.0000 ug | Freq: Once | INTRAMUSCULAR | Status: AC
  Administered 2014-04-24: 1000 ug via INTRAMUSCULAR

## 2014-04-25 ENCOUNTER — Ambulatory Visit: Payer: Medicare HMO

## 2014-05-23 ENCOUNTER — Ambulatory Visit (HOSPITAL_BASED_OUTPATIENT_CLINIC_OR_DEPARTMENT_OTHER): Payer: Medicare HMO

## 2014-05-23 VITALS — BP 130/65 | HR 60 | Temp 97.3°F

## 2014-05-23 DIAGNOSIS — D649 Anemia, unspecified: Secondary | ICD-10-CM

## 2014-05-23 DIAGNOSIS — E538 Deficiency of other specified B group vitamins: Secondary | ICD-10-CM

## 2014-05-23 MED ORDER — CYANOCOBALAMIN 1000 MCG/ML IJ SOLN
1000.0000 ug | Freq: Once | INTRAMUSCULAR | Status: AC
  Administered 2014-05-23: 1000 ug via INTRAMUSCULAR

## 2014-05-23 NOTE — Patient Instructions (Signed)
Vitamin B12 Injections Every person needs vitamin B12. A deficiency develops when the body does not get enough of it. One way to overcome this is by getting B12 shots (injections). A B12 shot puts the vitamin directly into muscle tissue. This avoids any problems your body might have in absorbing it from food or a pill. In some people, the body has trouble using the vitamin correctly. This can cause a B12 deficiency. Not consuming enough of the vitamin can also cause a deficiency. Getting enough vitamin B12 can be hard for elderly people. Sometimes, they do not eat a well-balanced diet. The elderly are also more likely than younger people to have medical conditions or take medications that can lead to a deficiency. WHAT DOES VITAMIN B12 DO? Vitamin B12 does many things to help the body work right:  It helps the body make healthy red blood cells.  It helps maintain nerve cells.  It is involved in the body's process of converting food into energy (metabolism).  It is needed to make the genetic material in all cells (DNA). VITAMIN B12 FOOD SOURCES Most people get plenty of vitamin B12 through the foods they eat. It is present in:  Meat, fish, poultry, and eggs.  Milk and milk products.  It also is added when certain foods are made, including some breads, cereals and yogurts. The food is then called "fortified". CAUSES The most common causes of vitamin B12 deficiency are:  Pernicious anemia. The condition develops when the body cannot make enough healthy red blood cells. This stems from a lack of a protein made in the stomach (intrinsic factor). People without this protein cannot absorb enough vitamin B12 from food.  Malabsorption. This is when the body cannot absorb the vitamin. It can be caused by:  Pernicious anemia.  Surgery to remove part or all of the stomach can lead to malabsorption. Removal of part or all of the small intestine can also cause malabsorption.  Vegetarian diet.  People who are strict about not eating foods from animals could have trouble taking in enough vitamin B12 from diet alone.  Medications. Some medicines have been linked to B12 deficiency, such as Metformin (a drug prescribed for type 2 diabetes). Long-term use of stomach acid suppressants also can keep the vitamin from being absorbed.  Intestinal problems such as inflammatory bowel disease. If there are problems in the digestive tract, vitamin B12 may not be absorbed in good enough amounts. SYMPTOMS People who do not get enough B12 can develop problems. These can include:  Anemia. This is when the body has too few red blood cells. Red blood cells carry oxygen to the rest of the body. Without a healthy supply of red blood cells, people can feel:  Tired (fatigued).  Weak.  Severe anemia can cause:  Shortness of breath.  Dizziness.  Rapid heart rate.  Paleness.  Other Vitamin B12 deficiency symptoms include:  Diarrhea.  Numbness or tingling in the hands or feet.  Loss of appetite.  Confusion.  Sores on the tongue or in the mouth. LET YOUR CAREGIVER KNOW ABOUT:  Any allergies. It is very important to know if you are allergic or sensitive to cobalt. Vitamin B12 contains cobalt.  Any history of kidney disease.  All medications you are taking. Include prescription and over-the-counter medicines, herbs and creams.  Whether you are pregnant or breast-feeding.  If you have Leber's disease, a hereditary eye condition, vitamin B12 could make it worse. RISKS AND COMPLICATIONS Reactions to an injection are   usually temporary. They might include:  Pain at the injection site.  Redness, swelling or tenderness at the site.  Headache, dizziness or weakness.  Nausea, upset stomach or diarrhea.  Numbness or tingling.  Fever.  Joint pain.  Itching or rash. If a reaction does not go away in a short while, talk with your healthcare provider. A change in the way the shots are  given, or where they are given, might need to be made. BEFORE AN INJECTION To decide whether B12 injections are right for you, your healthcare provider will probably:  Ask about your medical history.  Ask questions about your diet.  Ask about symptoms such as:  Have you felt weak?  Do you feel unusually tired?  Do you get dizzy?  Order blood tests. These may include a test to:  Check the level of red cells in your blood.  Measure B12 levels.  Check for the presence of intrinsic factor. VITAMIN B12 INJECTIONS How often you will need a vitamin B12 injection will depend on how severe your deficiency is. This also will affect how long you will need to get them. People with pernicious anemia usually get injections for their entire life. Others might get them for a shorter period. For many people, injections are given daily or weekly for several weeks. Then, once B12 levels are normal, injections are given just once a month. If the cause of the deficiency can be fixed, the injections can be stopped. Talk with your healthcare provider about what you should expect. For an injection:  The injection site will be cleaned with an alcohol swab.  Your healthcare provider will insert a needle directly into a muscle. Most any muscle can be used. Most often, an arm muscle is used. A buttocks muscle can also be used. Many people say shots in that area are less painful.  A small adhesive bandage may be put over the injection site. It usually can be taken off in an hour or less. Injections can be given by your healthcare provider. In some cases, family members give them. Sometimes, people give them to themselves. Talk with your healthcare provider about what would be best for you. If someone other than your healthcare provider will be giving the shots, the person will need to be trained to give them correctly. HOME CARE INSTRUCTIONS   You can remove the adhesive bandage within an hour of getting a  shot.  You should be able to go about your normal activities right away.  Avoid drinking large amounts of alcohol while taking vitamin B12 shots. Alcohol can interfere with the body's use of the vitamin. SEEK MEDICAL CARE IF:   Pain, redness, swelling or tenderness at the injection site does not get better or gets worse.  Headache, dizziness or weakness does not go away.  You develop a fever of more than 100.5 F (38.1 C). SEEK IMMEDIATE MEDICAL CARE IF:   You have chest pain.  You develop shortness of breath.  You have muscle weakness that gets worse.  You develop numbness, weakness or tingling on one side or one area of the body.  You have symptoms of an allergic reaction, such as:  Hives.  Difficulty breathing.  Swelling of the lips, face, tongue or throat.  You develop a fever of more than 102.0 F (38.9 C). MAKE SURE YOU:   Understand these instructions.  Will watch your condition.  Will get help right away if you are not doing well or get worse. Document   Released: 12/12/2008 Document Revised: 12/08/2011 Document Reviewed: 12/12/2008 ExitCare Patient Information 2015 [employer]. This information is not intended to replace advice given to you by your health care provider. Make sure you discuss any questions you have with your health care provider.  

## 2014-05-25 ENCOUNTER — Ambulatory Visit (INDEPENDENT_AMBULATORY_CARE_PROVIDER_SITE_OTHER): Payer: Medicare HMO | Admitting: *Deleted

## 2014-05-25 DIAGNOSIS — Z95810 Presence of automatic (implantable) cardiac defibrillator: Secondary | ICD-10-CM

## 2014-05-25 DIAGNOSIS — I2589 Other forms of chronic ischemic heart disease: Secondary | ICD-10-CM

## 2014-05-25 LAB — MDC_IDC_ENUM_SESS_TYPE_INCLINIC
Battery Remaining Longevity: 26.4 mo
Battery Voltage: 2.56 V
Brady Statistic RA Percent Paced: 57 %
Brady Statistic RV Percent Paced: 0.27 %
Date Time Interrogation Session: 20150827110746
HighPow Impedance: 65 Ohm
Implantable Pulse Generator Serial Number: 541189
Lead Channel Impedance Value: 325 Ohm
Lead Channel Impedance Value: 337.5 Ohm
Lead Channel Pacing Threshold Amplitude: 0.5 V
Lead Channel Pacing Threshold Amplitude: 0.5 V
Lead Channel Pacing Threshold Amplitude: 1 V
Lead Channel Pacing Threshold Amplitude: 1 V
Lead Channel Pacing Threshold Pulse Width: 0.5 ms
Lead Channel Pacing Threshold Pulse Width: 0.5 ms
Lead Channel Pacing Threshold Pulse Width: 0.5 ms
Lead Channel Pacing Threshold Pulse Width: 0.5 ms
Lead Channel Sensing Intrinsic Amplitude: 11.7 mV
Lead Channel Sensing Intrinsic Amplitude: 2.7 mV
Lead Channel Setting Pacing Amplitude: 2 V
Lead Channel Setting Pacing Amplitude: 2.5 V
Lead Channel Setting Pacing Pulse Width: 0.5 ms
Lead Channel Setting Sensing Sensitivity: 0.2 mV
Zone Setting Detection Interval: 320 ms

## 2014-05-25 NOTE — Progress Notes (Signed)
ICD check in clinic. Normal device function. Thresholds and sensing consistent with previous device measurements. Impedance trends stable over time. No evidence of any ventricular arrhythmias. No mode switches. 3 PMT. Histogram distribution appropriate for patient and level of activity. No changes made this session. Device programmed at appropriate safety margins. Device programmed to optimize intrinsic conduction. Estimated longevity 1.9-2.9yrs. Alert vibration not demonstrated for patient by request, pt knows to call clinic if felt. Merlin 08/28/14 & ROV w/ GT 5/16.

## 2014-06-07 ENCOUNTER — Telehealth: Payer: Self-pay | Admitting: Cardiovascular Disease

## 2014-06-07 NOTE — Telephone Encounter (Signed)
Patient is having tooth pulled tomorrow.  Wants to know if he needs to stop Plavix prior to.  / tgs

## 2014-06-14 ENCOUNTER — Telehealth: Payer: Self-pay | Admitting: *Deleted

## 2014-06-14 MED ORDER — FUROSEMIDE 40 MG PO TABS: 40.0000 mg | ORAL_TABLET | Freq: Every day | ORAL | Status: DC | PRN

## 2014-06-14 MED ORDER — CARVEDILOL 12.5 MG PO TABS: 12.5000 mg | ORAL_TABLET | Freq: Two times a day (BID) | ORAL | Status: DC

## 2014-06-14 NOTE — Telephone Encounter (Signed)
furosemide 540 mg,  carvedilol 12.5 mg, needs called i  To [place] pharmacy

## 2014-06-14 NOTE — Telephone Encounter (Signed)
Lasix 40mg  and carvedilol 12.5 sent to [facility] pharmacy

## 2014-06-19 ENCOUNTER — Encounter: Payer: Self-pay | Admitting: Internal Medicine

## 2014-06-20 ENCOUNTER — Ambulatory Visit (HOSPITAL_BASED_OUTPATIENT_CLINIC_OR_DEPARTMENT_OTHER): Payer: Medicare HMO

## 2014-06-20 VITALS — BP 140/74 | HR 60 | Temp 97.5°F

## 2014-06-20 DIAGNOSIS — E538 Deficiency of other specified B group vitamins: Secondary | ICD-10-CM

## 2014-06-20 DIAGNOSIS — D649 Anemia, unspecified: Secondary | ICD-10-CM

## 2014-06-20 MED ORDER — CYANOCOBALAMIN 1000 MCG/ML IJ SOLN
1000.0000 ug | Freq: Once | INTRAMUSCULAR | Status: AC
  Administered 2014-06-20: 1000 ug via INTRAMUSCULAR

## 2014-07-18 ENCOUNTER — Telehealth: Payer: Self-pay | Admitting: Hematology

## 2014-07-18 ENCOUNTER — Ambulatory Visit (HOSPITAL_BASED_OUTPATIENT_CLINIC_OR_DEPARTMENT_OTHER): Payer: Medicare HMO

## 2014-07-18 ENCOUNTER — Other Ambulatory Visit (HOSPITAL_BASED_OUTPATIENT_CLINIC_OR_DEPARTMENT_OTHER): Payer: Medicare HMO

## 2014-07-18 ENCOUNTER — Ambulatory Visit (HOSPITAL_BASED_OUTPATIENT_CLINIC_OR_DEPARTMENT_OTHER): Payer: Medicare HMO | Admitting: Hematology

## 2014-07-18 ENCOUNTER — Encounter: Payer: Self-pay | Admitting: Hematology

## 2014-07-18 VITALS — BP 145/68 | HR 60 | Temp 98.0°F | Resp 18 | Ht 74.0 in | Wt 215.0 lb

## 2014-07-18 DIAGNOSIS — E538 Deficiency of other specified B group vitamins: Secondary | ICD-10-CM

## 2014-07-18 DIAGNOSIS — D509 Iron deficiency anemia, unspecified: Secondary | ICD-10-CM

## 2014-07-18 DIAGNOSIS — D649 Anemia, unspecified: Secondary | ICD-10-CM

## 2014-07-18 LAB — VITAMIN B12: Vitamin B-12: 456 pg/mL (ref 211–911)

## 2014-07-18 LAB — BASIC METABOLIC PANEL (CC13)
ANION GAP: 9 meq/L (ref 3–11)
BUN: 15.3 mg/dL (ref 7.0–26.0)
CALCIUM: 9.4 mg/dL (ref 8.4–10.4)
CO2: 27 meq/L (ref 22–29)
CREATININE: 1.1 mg/dL (ref 0.7–1.3)
Chloride: 104 mEq/L (ref 98–109)
Glucose: 160 mg/dl — ABNORMAL HIGH (ref 70–140)
Potassium: 4.2 mEq/L (ref 3.5–5.1)
Sodium: 140 mEq/L (ref 136–145)

## 2014-07-18 LAB — IRON AND TIBC CHCC
%SAT: 13 % — AB (ref 20–55)
IRON: 47 ug/dL (ref 42–163)
TIBC: 378 ug/dL (ref 202–409)
UIBC: 330 ug/dL (ref 117–376)

## 2014-07-18 LAB — CBC WITH DIFFERENTIAL/PLATELET
BASO%: 0.2 % (ref 0.0–2.0)
BASOS ABS: 0 10*3/uL (ref 0.0–0.1)
EOS ABS: 0.3 10*3/uL (ref 0.0–0.5)
EOS%: 4.1 % (ref 0.0–7.0)
HCT: 41.2 % (ref 38.4–49.9)
HGB: 13.5 g/dL (ref 13.0–17.1)
LYMPH#: 1.6 10*3/uL (ref 0.9–3.3)
LYMPH%: 25 % (ref 14.0–49.0)
MCH: 28.4 pg (ref 27.2–33.4)
MCHC: 32.8 g/dL (ref 32.0–36.0)
MCV: 86.6 fL (ref 79.3–98.0)
MONO#: 0.9 10*3/uL (ref 0.1–0.9)
MONO%: 13.4 % (ref 0.0–14.0)
NEUT%: 57.3 % (ref 39.0–75.0)
NEUTROS ABS: 3.6 10*3/uL (ref 1.5–6.5)
Platelets: 190 10*3/uL (ref 140–400)
RBC: 4.76 10*6/uL (ref 4.20–5.82)
RDW: 14.1 % (ref 11.0–14.6)
WBC: 6.3 10*3/uL (ref 4.0–10.3)

## 2014-07-18 LAB — FERRITIN CHCC: FERRITIN: 33 ng/mL (ref 22–316)

## 2014-07-18 LAB — LACTATE DEHYDROGENASE (CC13): LDH: 176 U/L (ref 125–245)

## 2014-07-18 MED ORDER — CYANOCOBALAMIN 1000 MCG/ML IJ SOLN
1000.0000 ug | Freq: Once | INTRAMUSCULAR | Status: AC
  Administered 2014-07-18: 1000 ug via INTRAMUSCULAR

## 2014-07-18 NOTE — Telephone Encounter (Signed)
gv and printed appt sched and avs fo rpt for NOV thru April 2016 °

## 2014-07-18 NOTE — Progress Notes (Signed)
[facility] HEMATOLOGY OFFICE PROGRESS NOTE DATE OF VISIT: 07/18/2014  [name] Jester, DO [address] [place] [address]  DIAGNOSIS: Iron deficiency anemia - Plan: CBC with Differential, Comprehensive metabolic panel (Cmet) - [facility], Ferritin, Iron and TIBC [facility]  Chief Complaint  Patient presents with  . Follow-up    CURRENT THERAPY: Monthly Vitamin B-12 1000 mcg IM injection. These injections were restarted on 08/05/2012.  INTERVAL HISTORY:  [name] 68 y.o. male with a history of multiple co-morbidities including CAD s/p CABG, chronic systolic heart failure s/p ICD, CKD, diabetes mellitus, HTN, hyperlipidemia, GI bleeds, paroxysmal atrial fibrillation (deemed to be a poor anticoagulation candidate), CVA s/p righted side weakness, and carotid artery disease (s/p right CEA) is here for follow-up for his anemia.  He was last seen by Dr Rosie Fate on 01/04/2014. Today, he is without complaints.  He denies chest pain, fever or chills or acute shortness of breath.   Patient had a history of extracted about 2 months ago. He did get his flu shot he denies any fatigue, exertional dyspnea or cardiac symptoms. He will continue getting B12 shots on a monthly basis. His hemoglobin is improved today.     MEDICAL HISTORY: Past Medical History  Diagnosis Date  . Sinus bradycardia   . Tobacco abuse   . Chronic renal insufficiency   . Osteoarthritis   . Diabetes mellitus   . Mitral regurgitation   . Systolic CHF, chronic     Previously taken off lisinopril by primary doctor due to labs  . Carotid stenosis     a. Prior carotid dz/ surgery. b. Carotid dopp 07/2011 - 0-39% bilaterally.;   c. Doppler 11/13: 40-59% RICA, 0-39% LICA  . Cardiomyopathy, ischemic     a. Chronic systolic CHF s/p St. Jude dual chamber ICD 03/2008.  [name] Hx of CABG   . CAD (coronary artery disease)     a. STEMI s/p CABG 07/2007 (had preop cardiogenic shock, IABP, VDRF before surgery)  . Stroke     a. CVA  1990s - chronic pain in RUE after stroke.  [name] PAF (paroxysmal atrial fibrillation)     a. Poor Coumadin candidate due to hx of GI bleed.  . GI bleed     Reported history  . Splenomegaly   . Myocardial infarction   . Automatic implantable cardioverter-defibrillator in situ   . Hypertension     [facility]   . Sleep apnea     study done, 10 yrs ago, unable to tolerate CPAP  . Neuromuscular disorder     R sided weakness   . Anemia     Multifactorial  - hx of B12 def and iron deficiency, followed by heme[facility]   INTERIM HISTORY: has DM (diabetes mellitus), secondary, with peripheral vascular complications; ANEMIA, IRON DEFICIENCY; HYPERTENSION; CARDIOMYOPATHY, ISCHEMIC; CEREBROVASCULAR DISEASE; PVD; RENAL INSUFFICIENCY, CHRONIC; OSTEOARTHRITIS; AUTOMATIC IMPLANTABLE CARDIAC DEFIBRILLATOR SITU; CORONARY ARTERY BYPASS GRAFT, HX OF; Cholecystitis with cholelithiasis; Arthritis of knee, right; Right knee pain; Abnormality of gait; and Stiffness of right knee on his problem list.    ALLERGIES:  is allergic to codeine.  MEDICATIONS: has a current medication list which includes the following prescription(s): aspirin, atorvastatin, baclofen, bisacodyl, calcium carbonate, canagliflozin, carvedilol, clopidogrel, clotrimazole, furosemide, glimepiride, hydrocodone-acetaminophen, linagliptin, meclizine, menthol-methyl salicylate, nitroglycerin, oxycodone-acetaminophen, ranitidine hcl, senna, spironolactone, and tramadol.  SURGICAL HISTORY:  Past Surgical History  Procedure Laterality Date  . Coronary artery bypass graft    . Percutaneous coronary  intervention  01/10/2005    Charlies Constable, MD  . Coronary artery bypass graft  08/29/2007    x3; Salvatore Decent. Cornelius Moras MD  . Cardiac defibrillator placement  04/05/2008    Implantation of a St. Jude dual-chamber defibrillator, Doylene Canning. Ladona Ridgel , MD  . Cardiac defibrillator placement    . Tonsillectomy    . Joint replacement   1990's    L knee  . Total knee arthroplasty Right 08/16/2013    Procedure: RIGHT TOTAL KNEE ARTHROPLASTY;  Surgeon: Kathryne Hitch, MD;  Location: [facility] OR;  Service: Orthopedics;  Laterality: Right;   PROBLEM LIST:  1. History of iron-deficiency anemia.  2. History of vitamin B12 deficiency, currently on monthly vitamin B12 shots after patient expressed intolerance to oral vitamin B12.  3. Renal insufficiency.  4. Severe 3-vessel coronary artery disease status post coronary artery bypass graft on 08/29/2007. History of myocardial infarction  08/11/2007. Right dual chamber implantable cardioverter  defibrillator placed 04/05/2008.  5. Ischemic cardiomyopathy.  6. Hypertension.  7. Dyslipidemia.  8. Diabetes mellitus type 2.  9. 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 The patient is status post left carotid  endarterectomy.  10. Left total knee replacement at age 66.  11. History of splenomegaly seen on a CT scan from 08/14/2007 and 08/25/2007.   REVIEW OF SYSTEMS:   Constitutional: Denies fevers, chills or abnormal weight loss Eyes: Denies blurriness of vision Ears, nose, mouth, throat, and face: Denies mucositis or sore throat Respiratory: Denies cough, dyspnea or wheezes Cardiovascular: Denies palpitation, chest discomfort or lower extremity swelling Gastrointestinal:  Denies nausea, heartburn or change in bowel habits Skin: Denies abnormal skin rashes Lymphatics: Denies new lymphadenopathy or easy bruising Neurological:Denies numbness, tingling or new weaknesses Behavioral/Psych: Mood is stable, no new changes  All other systems were reviewed with the patient and are negative.  PHYSICAL EXAMINATION: ECOG PERFORMANCE STATUS: 0-1  Blood pressure 145/68, pulse 60, temperature 98 F (36.7 C), temperature source Oral, resp. rate 18, height 6\' 2"  (1.88 m), weight 215 lb (97.523 kg).  GENERAL:alert, no distress and comfortable;  SKIN:  skin color, texture, turgor are normal, no rashes or significant lesions EYES: normal, Conjunctiva are pink and non-injected, sclera clear OROPHARYNX:no exudate, no erythema and lips, buccal mucosa, and tongue normal ; poor dentition with many missing teeth.  NECK: supple, thyroid normal size, non-tender, without nodularity; Scars well healed on right neck area.   LYMPH:  no palpable lymphadenopathy in the cervical, axillary or supraclavicular LUNGS: clear to auscultation and percussion with normal breathing effort HEART: regular rate & rhythm and no murmurs and no lower extremity edema;+ Implanted ICD on the right infraclavicular area.   ABDOMEN:abdomen soft, non-tender and normal bowel sounds Musculoskeletal:no cyanosis of digits and no clubbing  NEURO: alert & oriented x 3 with fluent speech, R side weakness with a cautious gait.    LABORATORY DATA: Results for orders placed in visit on 07/18/14 (from the past 48 hour(s))  CBC WITH DIFFERENTIAL     Status: None   Collection Time    07/18/14  7:55 AM      Result Value Ref Range   WBC 6.3  4.0 - 10.3 10e3/uL   NEUT# 3.6  1.5 - 6.5 10e3/uL   HGB 13.5  13.0 - 17.1 g/dL   HCT 72.5  36.6 - 44.0 %   Platelets 190  140 - 400 10e3/uL   MCV 86.6  79.3 - 98.0 fL   MCH  28.4  27.2 - 33.4 pg   MCHC 32.8  32.0 - 36.0 g/dL   RBC 4.76  8.114.20 - 9.145.82 10e6/u4.09   RDW 14.1  11.0 - 14.6 %   lymph# 1.6  0.9 - 3.3 10e3/uL   MONO# 0.9  0.1 - 0.9 10e3/uL   Eosinophils Absolute 0.3  0.0 - 0.5 10e3/uL   Basophils Absolute 0.0  0.0 - 0.1 10e3/uL   NEUT% 57.3  39.0 - 75.0 %   LYMPH% 25.0  14.0 - 49.0 %   MONO% 13.4  0.0 - 14.0 %   EOS% 4.1  0.0 - 7.0 %   BASO% 0.2  0.0 - 2.0 %  BASIC METABOLIC PANEL (CC13)     Status: Abnormal   Collection Time    07/18/14  7:55 AM      Result Value Ref Range   Sodium 140  136 - 145 mEq/L   Potassium 4.2  3.5 - 5.1 mEq/L   Chloride 104  98 - 109 mEq/L   CO2 27  22 - 29 mEq/L   Glucose 160 (*) 70 - 140 mg/dl   BUN 78.215.3   7.0 - 95.626.0 mg/dL   Creatinine 1.1  0.7 - 1.3 mg/dL   Calcium 9.4  8.4 - 21.310.4 mg/dL   Anion Gap 9  3 - 11 mEq/L  LACTATE DEHYDROGENASE (CC13)     Status: None   Collection Time    07/18/14  7:55 AM      Result Value Ref Range   LDH 176  125 - 245 U/L    Labs:  Lab Results  Component Value Date   WBC 6.3 07/18/2014   HGB 13.5 07/18/2014   HCT 41.2 07/18/2014   MCV 86.6 07/18/2014   PLT 190 07/18/2014   NEUTROABS 3.6 07/18/2014      Chemistry      Component Value Date/Time   NA 140 07/18/2014 0755   NA 138 11/18/2013 1143   K 4.2 07/18/2014 0755   K 4.2 11/18/2013 1143   CL 99 11/18/2013 1143   CL 100 01/06/2013 0830   CO2 27 07/18/2014 0755   CO2 25 11/18/2013 1143   BUN 15.3 07/18/2014 0755   BUN 17 11/18/2013 1143   CREATININE 1.1 07/18/2014 0755   CREATININE 0.98 11/18/2013 1143      Component Value Date/Time   CALCIUM 9.4 07/18/2014 0755   CALCIUM 9.1 11/18/2013 1143   ALKPHOS 96 01/03/2014 1120   ALKPHOS 98 11/18/2013 1143   AST 13 01/03/2014 1120   AST 13 11/18/2013 1143   ALT 8 01/03/2014 1120   ALT 9 11/18/2013 1143   BILITOT 0.48 01/03/2014 1120   BILITOT 0.4 11/18/2013 1143     Basic Metabolic Panel:  Recent Labs Lab 07/18/14 0755  NA 140  K 4.2  CO2 27  GLUCOSE 160*  BUN 15.3  CREATININE 1.1  CALCIUM 9.4   GFR Estimated Creatinine Clearance: 75.8 ml/min (by C-G formula based on Cr of 1.1). Liver Function Tests: No results found for this basename: AST, ALT, ALKPHOS, BILITOT, PROT, ALBUMIN,  in the last 168 hours CBC:  Recent Labs Lab 07/18/14 0755  WBC 6.3  NEUTROABS 3.6  HGB 13.5  HCT 41.2  MCV 86.6  PLT 190     ASSESSMENT: [name] 68 y.o. male with a history of Iron deficiency anemia - Plan: CBC with Differential, Comprehensive metabolic panel (Cmet) - [facility], Ferritin, Iron and TIBC [facility]   PLAN:  1. Anemia. --  He continues to well from a hematological standpoint.  He comes here for a monthly vitamin B12 shot and will receive one  today.  His iron studies are pending today. His hemoglobin is 13.5. We will plan to see [name] again in 6 months at time we will check his CBC, CMET iron studies and ferritin.  All questions were answered. The patient knows to call the clinic with any problems, questions or concerns. We can certainly see the patient much sooner if necessary.  I spent 10 minutes counseling the patient face to face. The total time spent in the appointment was 15 minutes.    Cay Schillings, MD Medical Hematologist/Oncologist [facility] Pager: [phone] Office No: [phone]

## 2014-07-20 ENCOUNTER — Telehealth: Payer: Self-pay | Admitting: Cardiovascular Disease

## 2014-07-20 MED ORDER — SPIRONOLACTONE 25 MG PO TABS: 25.0000 mg | ORAL_TABLET | Freq: Every morning | ORAL | Status: DC

## 2014-07-20 MED ORDER — CLOPIDOGREL BISULFATE 75 MG PO TABS: 75.0000 mg | ORAL_TABLET | Freq: Every day | ORAL | Status: DC

## 2014-07-20 MED ORDER — ATORVASTATIN CALCIUM 80 MG PO TABS: 40.0000 mg | ORAL_TABLET | ORAL | Status: DC

## 2014-07-20 NOTE — Telephone Encounter (Signed)
Refill complete 

## 2014-07-20 NOTE — Telephone Encounter (Signed)
Need Atorvastatin, Spironolactone, Clopidogrel sent to [facility] / tgs

## 2014-08-15 ENCOUNTER — Ambulatory Visit (HOSPITAL_BASED_OUTPATIENT_CLINIC_OR_DEPARTMENT_OTHER): Payer: Medicare HMO

## 2014-08-15 DIAGNOSIS — E538 Deficiency of other specified B group vitamins: Secondary | ICD-10-CM

## 2014-08-15 DIAGNOSIS — D649 Anemia, unspecified: Secondary | ICD-10-CM

## 2014-08-15 MED ORDER — CYANOCOBALAMIN 1000 MCG/ML IJ SOLN
1000.0000 ug | Freq: Once | INTRAMUSCULAR | Status: AC
  Administered 2014-08-15: 1000 ug via INTRAMUSCULAR

## 2014-08-15 NOTE — Patient Instructions (Signed)
Vitamin B12 Injections Every person needs vitamin B12. A deficiency develops when the body does not get enough of it. One way to overcome this is by getting B12 shots (injections). A B12 shot puts the vitamin directly into muscle tissue. This avoids any problems your body might have in absorbing it from food or a pill. In some people, the body has trouble using the vitamin correctly. This can cause a B12 deficiency. Not consuming enough of the vitamin can also cause a deficiency. Getting enough vitamin B12 can be hard for elderly people. Sometimes, they do not eat a well-balanced diet. The elderly are also more likely than younger people to have medical conditions or take medications that can lead to a deficiency. WHAT DOES VITAMIN B12 DO? Vitamin B12 does many things to help the body work right:  It helps the body make healthy red blood cells.  It helps maintain nerve cells.  It is involved in the body's process of converting food into energy (metabolism).  It is needed to make the genetic material in all cells (DNA). VITAMIN B12 FOOD SOURCES Most people get plenty of vitamin B12 through the foods they eat. It is present in:  Meat, fish, poultry, and eggs.  Milk and milk products.  It also is added when certain foods are made, including some breads, cereals and yogurts. The food is then called "fortified". CAUSES The most common causes of vitamin B12 deficiency are:  Pernicious anemia. The condition develops when the body cannot make enough healthy red blood cells. This stems from a lack of a protein made in the stomach (intrinsic factor). People without this protein cannot absorb enough vitamin B12 from food.  Malabsorption. This is when the body cannot absorb the vitamin. It can be caused by:  Pernicious anemia.  Surgery to remove part or all of the stomach can lead to malabsorption. Removal of part or all of the small intestine can also cause malabsorption.  Vegetarian diet.  People who are strict about not eating foods from animals could have trouble taking in enough vitamin B12 from diet alone.  Medications. Some medicines have been linked to B12 deficiency, such as Metformin (a drug prescribed for type 2 diabetes). Long-term use of stomach acid suppressants also can keep the vitamin from being absorbed.  Intestinal problems such as inflammatory bowel disease. If there are problems in the digestive tract, vitamin B12 may not be absorbed in good enough amounts. SYMPTOMS People who do not get enough B12 can develop problems. These can include:  Anemia. This is when the body has too few red blood cells. Red blood cells carry oxygen to the rest of the body. Without a healthy supply of red blood cells, people can feel:  Tired (fatigued).  Weak.  Severe anemia can cause:  Shortness of breath.  Dizziness.  Rapid heart rate.  Paleness.  Other Vitamin B12 deficiency symptoms include:  Diarrhea.  Numbness or tingling in the hands or feet.  Loss of appetite.  Confusion.  Sores on the tongue or in the mouth. LET YOUR CAREGIVER KNOW ABOUT:  Any allergies. It is very important to know if you are allergic or sensitive to cobalt. Vitamin B12 contains cobalt.  Any history of kidney disease.  All medications you are taking. Include prescription and over-the-counter medicines, herbs and creams.  Whether you are pregnant or breast-feeding.  If you have Leber's disease, a hereditary eye condition, vitamin B12 could make it worse. RISKS AND COMPLICATIONS Reactions to an injection are   usually temporary. They might include:  Pain at the injection site.  Redness, swelling or tenderness at the site.  Headache, dizziness or weakness.  Nausea, upset stomach or diarrhea.  Numbness or tingling.  Fever.  Joint pain.  Itching or rash. If a reaction does not go away in a short while, talk with your healthcare provider. A change in the way the shots are  given, or where they are given, might need to be made. BEFORE AN INJECTION To decide whether B12 injections are right for you, your healthcare provider will probably:  Ask about your medical history.  Ask questions about your diet.  Ask about symptoms such as:  Have you felt weak?  Do you feel unusually tired?  Do you get dizzy?  Order blood tests. These may include a test to:  Check the level of red cells in your blood.  Measure B12 levels.  Check for the presence of intrinsic factor. VITAMIN B12 INJECTIONS How often you will need a vitamin B12 injection will depend on how severe your deficiency is. This also will affect how long you will need to get them. People with pernicious anemia usually get injections for their entire life. Others might get them for a shorter period. For many people, injections are given daily or weekly for several weeks. Then, once B12 levels are normal, injections are given just once a month. If the cause of the deficiency can be fixed, the injections can be stopped. Talk with your healthcare provider about what you should expect. For an injection:  The injection site will be cleaned with an alcohol swab.  Your healthcare provider will insert a needle directly into a muscle. Most any muscle can be used. Most often, an arm muscle is used. A buttocks muscle can also be used. Many people say shots in that area are less painful.  A small adhesive bandage may be put over the injection site. It usually can be taken off in an hour or less. Injections can be given by your healthcare provider. In some cases, family members give them. Sometimes, people give them to themselves. Talk with your healthcare provider about what would be best for you. If someone other than your healthcare provider will be giving the shots, the person will need to be trained to give them correctly. HOME CARE INSTRUCTIONS   You can remove the adhesive bandage within an hour of getting a  shot.  You should be able to go about your normal activities right away.  Avoid drinking large amounts of alcohol while taking vitamin B12 shots. Alcohol can interfere with the body's use of the vitamin. SEEK MEDICAL CARE IF:   Pain, redness, swelling or tenderness at the injection site does not get better or gets worse.  Headache, dizziness or weakness does not go away.  You develop a fever of more than 100.5 F (38.1 C). SEEK IMMEDIATE MEDICAL CARE IF:   You have chest pain.  You develop shortness of breath.  You have muscle weakness that gets worse.  You develop numbness, weakness or tingling on one side or one area of the body.  You have symptoms of an allergic reaction, such as:  Hives.  Difficulty breathing.  Swelling of the lips, face, tongue or throat.  You develop a fever of more than 102.0 F (38.9 C). MAKE SURE YOU:   Understand these instructions.  Will watch your condition.  Will get help right away if you are not doing well or get worse. Document   Released: 12/12/2008 Document Revised: 12/08/2011 Document Reviewed: 12/12/2008 ExitCare Patient Information 2015 [employer]. This information is not intended to replace advice given to you by your health care provider. Make sure you discuss any questions you have with your health care provider.  

## 2014-08-28 ENCOUNTER — Encounter: Payer: Self-pay | Admitting: Internal Medicine

## 2014-08-28 ENCOUNTER — Ambulatory Visit (INDEPENDENT_AMBULATORY_CARE_PROVIDER_SITE_OTHER): Payer: Medicare HMO | Admitting: *Deleted

## 2014-08-28 ENCOUNTER — Telehealth: Payer: Self-pay | Admitting: *Deleted

## 2014-08-28 DIAGNOSIS — I429 Cardiomyopathy, unspecified: Secondary | ICD-10-CM

## 2014-08-28 NOTE — Telephone Encounter (Signed)
New Message  Pt wanted to confirm that the remote Defib check went through; please call back and advise

## 2014-08-28 NOTE — Telephone Encounter (Signed)
Informed pt that transmission was received. Pt verbalized understanding.  

## 2014-08-29 NOTE — Progress Notes (Signed)
Remote ICD transmission.   

## 2014-08-31 LAB — MDC_IDC_ENUM_SESS_TYPE_REMOTE
Battery Remaining Longevity: 14 mo
Battery Voltage: 2.54 V
Brady Statistic AP VP Percent: 1 %
Brady Statistic AP VS Percent: 63 %
Brady Statistic AS VP Percent: 1 %
Brady Statistic AS VS Percent: 37 %
Brady Statistic RA Percent Paced: 63 %
Brady Statistic RV Percent Paced: 1 %
Date Time Interrogation Session: 20151130070027
HighPow Impedance: 65 Ohm
HighPow Impedance: 65 Ohm
Lead Channel Impedance Value: 340 Ohm
Lead Channel Pacing Threshold Amplitude: 0.5 V
Lead Channel Pacing Threshold Amplitude: 1 V
Lead Channel Pacing Threshold Pulse Width: 0.5 ms
Lead Channel Sensing Intrinsic Amplitude: 11.7 mV
Lead Channel Sensing Intrinsic Amplitude: 2.6 mV
MDC IDC MSMT LEADCHNL RA IMPEDANCE VALUE: 340 Ohm
MDC IDC MSMT LEADCHNL RA PACING THRESHOLD PULSEWIDTH: 0.5 ms
MDC IDC PG SERIAL: 541189
MDC IDC SET LEADCHNL RA PACING AMPLITUDE: 2 V
MDC IDC SET LEADCHNL RV PACING AMPLITUDE: 2.5 V
MDC IDC SET LEADCHNL RV PACING PULSEWIDTH: 0.5 ms
MDC IDC SET LEADCHNL RV SENSING SENSITIVITY: 0.2 mV
Zone Setting Detection Interval: 320 ms

## 2014-09-12 ENCOUNTER — Ambulatory Visit (HOSPITAL_BASED_OUTPATIENT_CLINIC_OR_DEPARTMENT_OTHER): Payer: Medicare HMO

## 2014-09-12 DIAGNOSIS — E538 Deficiency of other specified B group vitamins: Secondary | ICD-10-CM

## 2014-09-12 DIAGNOSIS — D649 Anemia, unspecified: Secondary | ICD-10-CM

## 2014-09-12 MED ORDER — CYANOCOBALAMIN 1000 MCG/ML IJ SOLN
1000.0000 ug | Freq: Once | INTRAMUSCULAR | Status: AC
  Administered 2014-09-12: 1000 ug via INTRAMUSCULAR

## 2014-09-12 NOTE — Patient Instructions (Signed)

## 2014-09-14 ENCOUNTER — Encounter: Payer: Self-pay | Admitting: Cardiology

## 2014-10-09 ENCOUNTER — Other Ambulatory Visit: Payer: Self-pay

## 2014-10-10 ENCOUNTER — Ambulatory Visit (HOSPITAL_BASED_OUTPATIENT_CLINIC_OR_DEPARTMENT_OTHER): Payer: Medicare HMO

## 2014-10-10 ENCOUNTER — Telehealth: Payer: Self-pay | Admitting: *Deleted

## 2014-10-10 DIAGNOSIS — E538 Deficiency of other specified B group vitamins: Secondary | ICD-10-CM

## 2014-10-10 DIAGNOSIS — D649 Anemia, unspecified: Secondary | ICD-10-CM

## 2014-10-10 MED ORDER — CYANOCOBALAMIN 1000 MCG/ML IJ SOLN
1000.0000 ug | Freq: Once | INTRAMUSCULAR | Status: AC
  Administered 2014-10-10: 1000 ug via INTRAMUSCULAR

## 2014-10-10 NOTE — Telephone Encounter (Signed)
Received return call from patient,  He has been with wife who is sick and in hospital.  Will come today for injection

## 2014-10-10 NOTE — Telephone Encounter (Signed)
Called patient about missed appointment.  Left message to call back to reschedule.

## 2014-10-18 ENCOUNTER — Telehealth: Payer: Self-pay | Admitting: Oncology

## 2014-10-18 NOTE — Telephone Encounter (Signed)
moved 4/5 appt from CP1 to FS. left message on vm for pt. other appts remain the same and confirmed in vm. schedule mailed.

## 2014-11-03 ENCOUNTER — Telehealth: Payer: Self-pay | Admitting: *Deleted

## 2014-11-03 MED ORDER — CARVEDILOL 12.5 MG PO TABS: 12.5000 mg | ORAL_TABLET | Freq: Two times a day (BID) | ORAL | Status: DC

## 2014-11-03 NOTE — Telephone Encounter (Signed)
CARVEDILOL NEEDS CALLED IN TO [place] FAX [phone]

## 2014-11-03 NOTE — Telephone Encounter (Signed)
escribed refill as requested 

## 2014-11-07 ENCOUNTER — Telehealth: Payer: Self-pay | Admitting: Cardiovascular Disease

## 2014-11-07 ENCOUNTER — Ambulatory Visit (HOSPITAL_BASED_OUTPATIENT_CLINIC_OR_DEPARTMENT_OTHER): Payer: Medicare HMO

## 2014-11-07 VITALS — BP 139/66 | HR 60 | Temp 98.0°F

## 2014-11-07 DIAGNOSIS — E538 Deficiency of other specified B group vitamins: Secondary | ICD-10-CM

## 2014-11-07 DIAGNOSIS — D649 Anemia, unspecified: Secondary | ICD-10-CM

## 2014-11-07 DIAGNOSIS — D509 Iron deficiency anemia, unspecified: Secondary | ICD-10-CM

## 2014-11-07 MED ORDER — CYANOCOBALAMIN 1000 MCG/ML IJ SOLN
1000.0000 ug | Freq: Once | INTRAMUSCULAR | Status: AC
  Administered 2014-11-07: 1000 ug via INTRAMUSCULAR

## 2014-11-07 NOTE — Telephone Encounter (Signed)
Patient seeing Dr Magnus Ivan for back and lower leg pain.They want to do some type of epidural pain/steroid injection and need to know about stopping Plavix

## 2014-11-07 NOTE — Telephone Encounter (Signed)
Patient states that he is to have shot in his back and needs ok to stop Plavix for 5 days prior. / tgs

## 2014-11-07 NOTE — Telephone Encounter (Signed)
I haven't seen him since 06/2013 and did not prescribe it. He may be taking it for h/o CVA.

## 2014-11-08 NOTE — Telephone Encounter (Signed)
Pt also Dr Ladona Ridgel, sent to him for advise

## 2014-11-09 ENCOUNTER — Telehealth: Payer: Self-pay | Admitting: *Deleted

## 2014-11-09 NOTE — Telephone Encounter (Signed)
-----   Message from Marinus Maw, MD sent at 11/08/2014 10:19 PM EST ----- Regarding: RE: Stopping Plavix Ok to hold plavix for epidural injection. GT ----- Message -----    From: Kerney Elbe, LPN    Sent: 11/07/5186  10:29 AM      To: Marinus Maw, MD Subject: Stopping Plavix                                Patient seeing Dr Magnus Ivan for back and lower leg pain.They want to do some type of epidural pain/steroid injection and need to know about stopping Plavix

## 2014-11-09 NOTE — Telephone Encounter (Signed)
Patient notified of stopping Plavix for epidural. Patient voiced understanding. All questions answered.

## 2014-11-28 ENCOUNTER — Ambulatory Visit (INDEPENDENT_AMBULATORY_CARE_PROVIDER_SITE_OTHER): Payer: Medicare HMO | Admitting: *Deleted

## 2014-11-28 DIAGNOSIS — I429 Cardiomyopathy, unspecified: Secondary | ICD-10-CM

## 2014-11-28 NOTE — Progress Notes (Signed)
Remote ICD transmission.   

## 2014-12-01 LAB — MDC_IDC_ENUM_SESS_TYPE_REMOTE
Battery Remaining Longevity: 14 mo
Brady Statistic AP VP Percent: 1 %
Brady Statistic AS VP Percent: 1 %
Brady Statistic AS VS Percent: 35 %
Brady Statistic RA Percent Paced: 64 %
Brady Statistic RV Percent Paced: 1 %
Date Time Interrogation Session: 20160301083548
HIGH POWER IMPEDANCE MEASURED VALUE: 70 Ohm
HighPow Impedance: 70 Ohm
Lead Channel Impedance Value: 350 Ohm
Lead Channel Pacing Threshold Amplitude: 1 V
Lead Channel Pacing Threshold Pulse Width: 0.5 ms
Lead Channel Pacing Threshold Pulse Width: 0.5 ms
Lead Channel Sensing Intrinsic Amplitude: 11.7 mV
Lead Channel Setting Pacing Amplitude: 2.5 V
Lead Channel Setting Pacing Pulse Width: 0.5 ms
Lead Channel Setting Sensing Sensitivity: 0.2 mV
MDC IDC MSMT BATTERY VOLTAGE: 2.54 V
MDC IDC MSMT LEADCHNL RA PACING THRESHOLD AMPLITUDE: 0.5 V
MDC IDC MSMT LEADCHNL RA SENSING INTR AMPL: 2.8 mV
MDC IDC MSMT LEADCHNL RV IMPEDANCE VALUE: 340 Ohm
MDC IDC PG SERIAL: 541189
MDC IDC SET LEADCHNL RA PACING AMPLITUDE: 2 V
MDC IDC STAT BRADY AP VS PERCENT: 64 %
Zone Setting Detection Interval: 320 ms

## 2014-12-05 ENCOUNTER — Ambulatory Visit (HOSPITAL_BASED_OUTPATIENT_CLINIC_OR_DEPARTMENT_OTHER): Payer: Medicare HMO

## 2014-12-05 VITALS — BP 124/66 | HR 60 | Temp 97.6°F

## 2014-12-05 DIAGNOSIS — D649 Anemia, unspecified: Secondary | ICD-10-CM

## 2014-12-05 DIAGNOSIS — D508 Other iron deficiency anemias: Secondary | ICD-10-CM

## 2014-12-05 DIAGNOSIS — E538 Deficiency of other specified B group vitamins: Secondary | ICD-10-CM

## 2014-12-05 MED ORDER — CYANOCOBALAMIN 1000 MCG/ML IJ SOLN
1000.0000 ug | Freq: Once | INTRAMUSCULAR | Status: AC
  Administered 2014-12-05: 1000 ug via INTRAMUSCULAR

## 2014-12-14 ENCOUNTER — Encounter: Payer: Self-pay | Admitting: Cardiology

## 2014-12-20 ENCOUNTER — Encounter: Payer: Self-pay | Admitting: Internal Medicine

## 2015-01-02 ENCOUNTER — Other Ambulatory Visit (HOSPITAL_BASED_OUTPATIENT_CLINIC_OR_DEPARTMENT_OTHER): Payer: Medicare HMO

## 2015-01-02 ENCOUNTER — Other Ambulatory Visit: Payer: Self-pay | Admitting: Oncology

## 2015-01-02 ENCOUNTER — Ambulatory Visit (HOSPITAL_BASED_OUTPATIENT_CLINIC_OR_DEPARTMENT_OTHER): Payer: Medicare HMO | Admitting: Oncology

## 2015-01-02 ENCOUNTER — Ambulatory Visit (HOSPITAL_BASED_OUTPATIENT_CLINIC_OR_DEPARTMENT_OTHER): Payer: Medicare HMO

## 2015-01-02 ENCOUNTER — Telehealth: Payer: Self-pay | Admitting: Oncology

## 2015-01-02 VITALS — BP 142/67 | HR 60 | Temp 97.6°F | Resp 18 | Ht 74.0 in | Wt 214.7 lb

## 2015-01-02 DIAGNOSIS — E538 Deficiency of other specified B group vitamins: Secondary | ICD-10-CM

## 2015-01-02 DIAGNOSIS — D509 Iron deficiency anemia, unspecified: Secondary | ICD-10-CM

## 2015-01-02 LAB — COMPREHENSIVE METABOLIC PANEL (CC13)
ALT: 10 U/L (ref 0–55)
ANION GAP: 11 meq/L (ref 3–11)
AST: 15 U/L (ref 5–34)
Albumin: 3.9 g/dL (ref 3.5–5.0)
Alkaline Phosphatase: 94 U/L (ref 40–150)
BUN: 17.9 mg/dL (ref 7.0–26.0)
CALCIUM: 8.8 mg/dL (ref 8.4–10.4)
CHLORIDE: 106 meq/L (ref 98–109)
CO2: 25 meq/L (ref 22–29)
CREATININE: 1.1 mg/dL (ref 0.7–1.3)
EGFR: 70 mL/min/{1.73_m2} — ABNORMAL LOW (ref >90)
GLUCOSE: 108 mg/dL (ref 70–140)
Potassium: 4.2 mEq/L (ref 3.5–5.1)
SODIUM: 142 meq/L (ref 136–145)
Total Bilirubin: 0.51 mg/dL (ref 0.20–1.20)
Total Protein: 6.7 g/dL (ref 6.4–8.3)

## 2015-01-02 LAB — CBC WITH DIFFERENTIAL/PLATELET
BASO%: 0.1 % (ref 0.0–2.0)
BASOS ABS: 0 10*3/uL (ref 0.0–0.1)
EOS%: 2.2 % (ref 0.0–7.0)
Eosinophils Absolute: 0.2 10*3/uL (ref 0.0–0.5)
HCT: 39.9 % (ref 38.4–49.9)
HGB: 13 g/dL (ref 13.0–17.1)
LYMPH#: 1.2 10*3/uL (ref 0.9–3.3)
LYMPH%: 14.9 % (ref 14.0–49.0)
MCH: 27.7 pg (ref 27.2–33.4)
MCHC: 32.6 g/dL (ref 32.0–36.0)
MCV: 84.9 fL (ref 79.3–98.0)
MONO#: 0.9 10*3/uL (ref 0.1–0.9)
MONO%: 10.9 % (ref 0.0–14.0)
NEUT#: 6 10*3/uL (ref 1.5–6.5)
NEUT%: 71.9 % (ref 39.0–75.0)
Platelets: 183 10*3/uL (ref 140–400)
RBC: 4.7 10*6/uL (ref 4.20–5.82)
RDW: 14.8 % — AB (ref 11.0–14.6)
WBC: 8.3 10*3/uL (ref 4.0–10.3)

## 2015-01-02 LAB — VITAMIN B12: Vitamin B-12: 426 pg/mL (ref 211–911)

## 2015-01-02 MED ORDER — CYANOCOBALAMIN 1000 MCG/ML IJ SOLN
1000.0000 ug | Freq: Once | INTRAMUSCULAR | Status: AC
  Administered 2015-01-02: 1000 ug via INTRAMUSCULAR

## 2015-01-02 NOTE — Progress Notes (Signed)
[facility] HEMATOLOGY OFFICE PROGRESS NOTE   [name] Jester, DO [address] [place] [address]  DIAGNOSIS: 69 year old gentleman with multifactorial anemia. He has an element of iron deficiency, B12 and anemia of chronic disease. This was diagnosed in 2008.     CURRENT THERAPY: Monthly Vitamin B-12 1000 mcg IM injection. These injections were restarted on 08/05/2012.  INTERVAL HISTORY:  [name] 69 y.o. male presents today for a follow-up visit. Since the last visit, he has no complaints. He does not report any complications related to vitamin B12 injections. He has not reported any injection-related complications. He does not report any fatigue or tiredness or deterioration in his health. He has not had any hospitalizations or illnesses. He still able to drive and attends to activities of daily living. He does not report any headaches, blurry vision, syncope or seizures. He does not report any other strokes. He is not report any chest pain, palpitation, orthopnea. He does not report any cough, wheezing or hemoptysis. He does not report any nausea, vomiting, abdominal pain. He does not report any musca skeletal complaints. He does not report any frequency urgency or hesitancy. Remainder review of systems unremarkable.  MEDICAL HISTORY: Past Medical History  Diagnosis Date  . Sinus bradycardia   . Tobacco abuse   . Chronic renal insufficiency   . Osteoarthritis   . Diabetes mellitus   . Mitral regurgitation   . Systolic CHF, chronic     Previously taken off lisinopril by primary doctor due to labs  . Carotid stenosis     a. Prior carotid dz/ surgery. b. Carotid dopp 07/2011 - 0-39% bilaterally.;   c. Doppler 11/13: 40-59% RICA, 0-39% LICA  . Cardiomyopathy, ischemic     a. Chronic systolic CHF s/p St. Jude dual chamber ICD 03/2008.  [name] Hx of CABG   . CAD (coronary artery disease)     a. STEMI s/p CABG 07/2007 (had preop cardiogenic shock, IABP, VDRF before  surgery)  . Stroke     a. CVA 1990s - chronic pain in RUE after stroke.  [name] PAF (paroxysmal atrial fibrillation)     a. Poor Coumadin candidate due to hx of GI bleed.  . GI bleed     Reported history  . Splenomegaly   . Myocardial infarction   . Automatic implantable cardioverter-defibrillator in situ   . Hypertension     [facility]   . Sleep apnea     study done, 10 yrs ago, unable to tolerate CPAP  . Neuromuscular disorder     R sided weakness   . Anemia     Multifactorial  - hx of B12 def and iron deficiency, [facility] heme[facility]   IALLERGIES:  is allergic to codeine.  MEDICATIONS:   Current Outpatient Prescriptions  Medication Sig Dispense Refill  . aspirin EC 81 MG EC tablet Take 1 tablet (81 mg total) by mouth daily.    [name] atorvastatin (LIPITOR) 80 MG tablet Take 0.5 tablets (40 mg total) by mouth every other day. 90 tablet 3  . baclofen (LIORESAL) 20 MG tablet Take 40 mg by mouth at bedtime.     [name] BISACODYL PO Take 1 tablet by mouth daily as needed.    . calcium carbonate (TUMS - DOSED IN MG ELEMENTAL CALCIUM) 500 MG chewable tablet Chew 1 tablet by mouth as needed.    . Canagliflozin (INVOKANA) 300 MG TABS Take 1 tablet by mouth daily.    [name]  carvedilol (COREG) 12.5 MG tablet Take 1 tablet (12.5 mg total) by mouth 2 (two) times daily with a meal. 180 tablet 3  . clopidogrel (PLAVIX) 75 MG tablet Take 1 tablet (75 mg total) by mouth daily. 90 tablet 3  . clotrimazole (LOTRIMIN) 1 % cream Apply 1 application topically as needed.    . furosemide (LASIX) 40 MG tablet Take 1 tablet (40 mg total) by mouth daily as needed. 30 tablet 6  . glimepiride (AMARYL) 4 MG tablet Take 4 mg by mouth 2 (two) times daily.     [name] HYDROcodone-acetaminophen (NORCO/VICODIN) 5-325 MG per tablet Take 1-2 tablets by mouth every 4 (four) hours as needed for moderate pain (breakthrough pain). 60 tablet 0  . linagliptin (TRADJENTA) 5 MG TABS tablet Take 5 mg by mouth  daily after breakfast.     . meclizine (ANTIVERT) 25 MG tablet Take 25 mg by mouth 4 (four) times daily as needed. For dizziness    . Menthol-Methyl Salicylate (MUSCLE RUB EX) Apply 1 application topically as needed.     . nitroGLYCERIN (NITROSTAT) 0.4 MG SL tablet Place 1 tablet (0.4 mg total) under the tongue every 5 (five) minutes as needed for chest pain. 25 tablet 4  . oxyCODONE-acetaminophen (PERCOCET) 10-325 MG per tablet Take 1 tablet by mouth every 6 (six) hours.    [name] RANITIDINE HCL PO Take 1 tablet by mouth daily as needed.    . SENNA CO 1 tablet as needed.    [name] spironolactone (ALDACTONE) 25 MG tablet Take 1 tablet (25 mg total) by mouth every morning. 90 tablet 3  . traMADol (ULTRAM) 50 MG tablet Take 50 mg by mouth 3 (three) times daily as needed.     No current facility-administered medications for this visit.     PHYSICAL EXAMINATION: ECOG PERFORMANCE STATUS: 0-1  Blood pressure 142/67, pulse 60, temperature 97.6 F (36.4 C), temperature source Oral, resp. rate 18, height  (1.88 m), weight 214 lb 11.2 oz (97.387 kg).  GENERAL:alert, no distress and comfortable;  SKIN: skin color, texture, turgor are normal, no rashes or significant lesions EYES: normal, Conjunctiva are pink and non-injected.  OROPHARYNX:no exudate, no erythema and lips, buccal mucosa, and tongue normal.  NECK: supple, thyroid normal size, non-tender, without nodularity; Scars well healed on right neck area.   LYMPH:  no palpable lymphadenopathy in the cervical, axillary or supraclavicular LUNGS: clear to auscultation and percussion with normal breathing effort HEART: regular rate & rhythm and no murmurs and no lower extremity edema;+ Implanted ICD on the right infraclavicular area.   ABDOMEN:abdomen soft, non-tender and normal bowel sounds. No shifting dullness or ascites. Musculoskeletal:no cyanosis of digits and no clubbing  NEURO: alert & oriented x 3 with fluent speech, R side weakness with a  cautious gait.      Labs:  Lab Results  Component Value Date   WBC 8.3 01/02/2015   HGB 13.0 01/02/2015   HCT 39.9 01/02/2015   MCV 84.9 01/02/2015   PLT 183 01/02/2015   NEUTROABS 6.0 01/02/2015      Chemistry      Component Value Date/Time   NA 140 07/18/2014 0755   NA 138 11/18/2013 1143   K 4.2 07/18/2014 0755   K 4.2 11/18/2013 1143   CL 99 11/18/2013 1143   CL 100 01/06/2013 0830   CO2 27 07/18/2014 0755   CO2 25 11/18/2013 1143   BUN 15.3 07/18/2014 0755   BUN 17 11/18/2013 1143   CREATININE 1.1  07/18/2014 0755   CREATININE 0.98 11/18/2013 1143      Component Value Date/Time   CALCIUM 9.4 07/18/2014 0755   CALCIUM 9.1 11/18/2013 1143   ALKPHOS 96 01/03/2014 1120   ALKPHOS 98 11/18/2013 1143   AST 13 01/03/2014 1120   AST 13 11/18/2013 1143   ALT 8 01/03/2014 1120   ALT 9 11/18/2013 1143   BILITOT 0.48 01/03/2014 1120   BILITOT 0.4 11/18/2013 1143       ASSESSMENT:    69 year old gentleman with the following issues:  PLAN:  1. Anemia: This appears to be multifactorial in nature with element of iron deficiency, B12 on chronic disease. He responded well to B12 injections and the plan is to continue with monthly injection and check his B12 levels every 6 months. His hemoglobin continues to be perfectly normal and he is asymptomatic.  2. Follow-up: Will be in 6 months sooner if needed to. I offered him a referral to [facility] to get his injection closer to home but he declined.  [facility] MD 01/02/2015

## 2015-01-02 NOTE — Telephone Encounter (Signed)
gave and printed appt sched and avs for pt regarding to May thru OCT appt.Marland KitchenMarland KitchenMarland Kitchen

## 2015-01-30 ENCOUNTER — Ambulatory Visit (HOSPITAL_BASED_OUTPATIENT_CLINIC_OR_DEPARTMENT_OTHER): Payer: Medicare HMO

## 2015-01-30 VITALS — BP 136/68 | HR 62 | Temp 97.8°F | Resp 16

## 2015-01-30 DIAGNOSIS — E538 Deficiency of other specified B group vitamins: Secondary | ICD-10-CM | POA: Diagnosis not present

## 2015-01-30 DIAGNOSIS — D509 Iron deficiency anemia, unspecified: Secondary | ICD-10-CM

## 2015-01-30 MED ORDER — CYANOCOBALAMIN 1000 MCG/ML IJ SOLN
1000.0000 ug | Freq: Once | INTRAMUSCULAR | Status: AC
  Administered 2015-01-30: 1000 ug via INTRAMUSCULAR

## 2015-01-30 NOTE — Patient Instructions (Signed)

## 2015-02-22 ENCOUNTER — Other Ambulatory Visit: Payer: Self-pay

## 2015-02-22 ENCOUNTER — Encounter: Payer: Self-pay | Admitting: Internal Medicine

## 2015-02-22 ENCOUNTER — Ambulatory Visit (INDEPENDENT_AMBULATORY_CARE_PROVIDER_SITE_OTHER): Payer: Medicare HMO | Admitting: Internal Medicine

## 2015-02-22 VITALS — BP 138/76 | HR 61 | Ht 73.0 in | Wt 214.4 lb

## 2015-02-22 DIAGNOSIS — Z95810 Presence of automatic (implantable) cardiac defibrillator: Secondary | ICD-10-CM | POA: Diagnosis not present

## 2015-02-22 DIAGNOSIS — I10 Essential (primary) hypertension: Secondary | ICD-10-CM

## 2015-02-22 DIAGNOSIS — I255 Ischemic cardiomyopathy: Secondary | ICD-10-CM | POA: Diagnosis not present

## 2015-02-22 LAB — CUP PACEART INCLINIC DEVICE CHECK
Battery Remaining Longevity: 16.8 mo
Brady Statistic RV Percent Paced: 0.94 %
Date Time Interrogation Session: 20160526091526
HIGH POWER IMPEDANCE MEASURED VALUE: 65.25 Ohm
Lead Channel Impedance Value: 350 Ohm
Lead Channel Pacing Threshold Amplitude: 1 V
Lead Channel Pacing Threshold Pulse Width: 0.5 ms
Lead Channel Sensing Intrinsic Amplitude: 2.6 mV
Lead Channel Setting Pacing Amplitude: 2.5 V
Lead Channel Setting Pacing Pulse Width: 0.5 ms
Lead Channel Setting Sensing Sensitivity: 0.2 mV
MDC IDC MSMT BATTERY VOLTAGE: 2.54 V
MDC IDC MSMT LEADCHNL RA PACING THRESHOLD AMPLITUDE: 0.5 V
MDC IDC MSMT LEADCHNL RV IMPEDANCE VALUE: 325 Ohm
MDC IDC MSMT LEADCHNL RV PACING THRESHOLD PULSEWIDTH: 0.5 ms
MDC IDC MSMT LEADCHNL RV SENSING INTR AMPL: 11.7 mV
MDC IDC SET LEADCHNL RA PACING AMPLITUDE: 2 V
MDC IDC SET ZONE DETECTION INTERVAL: 320 ms
MDC IDC STAT BRADY RA PERCENT PACED: 62 %
Pulse Gen Serial Number: 541189

## 2015-02-22 NOTE — Patient Instructions (Signed)
Medication Instructions:  Your physician has requested that you have cardiac CT. Cardiac computed tomography (CT) is a painless test that uses an x-ray machine to take clear, detailed pictures of your heart. For further information please visit [email or web address] Please follow instruction sheet as given.     Labwork: None ordered  Testing/Procedures: none ordered  Follow-Up: Your physician wants you to follow-up in: 12 months with Dr Court Joy will receive a reminder letter in the mail two months in advance. If you don't receive a letter, please call our office to schedule the follow-up appointment.   Remote monitoring is used to monitor your Pacemaker or ICD from home. This monitoring reduces the number of office visits required to check your device to one time per year. It allows [place] to keep an eye on the functioning of your device to ensure it is working properly. You are scheduled for a device check from home on 05/24/15. You may send your transmission at any time that day. If you have a wireless device, the transmission will be sent automatically. After your physician reviews your transmission, you will receive a postcard with your next transmission date.    Any Other Special Instructions Will Be Listed Below (If Applicable).

## 2015-02-22 NOTE — Assessment & Plan Note (Signed)
His St. Jude ICD is working normally. Will recheck in several months.  

## 2015-02-22 NOTE — Assessment & Plan Note (Signed)
He denies anginal symptoms. He will continue his current meds and I have encouraged him to increase his physical activity. 

## 2015-02-22 NOTE — Progress Notes (Signed)
HPI [name] returns today for followup. He is a very pleasant 69 year old man with an ischemic cardiomyopathy, chronic systolic heart failure, status post ICD implantation. In the interim, he has been stable. He denies chest pain or shortness of breath. He has mild peripheral edema.  He denies syncope or near-syncope. No chest pain. Allergies  Allergen Reactions  . Codeine Nausea And Vomiting     Current Outpatient Prescriptions  Medication Sig Dispense Refill  . aspirin EC 81 MG EC tablet Take 1 tablet (81 mg total) by mouth daily.    [name] atorvastatin (LIPITOR) 80 MG tablet Take 0.5 tablets (40 mg total) by mouth every other day. 90 tablet 3  . baclofen (LIORESAL) 20 MG tablet Take 40 mg by mouth at bedtime.     [name] BISACODYL PO Take 1 tablet by mouth daily as needed (constipation).     . calcium carbonate (TUMS - DOSED IN MG ELEMENTAL CALCIUM) 500 MG chewable tablet Chew 1 tablet by mouth 3 (three) times daily as needed for indigestion or heartburn.     . Canagliflozin (INVOKANA) 300 MG TABS Take 1 tablet by mouth daily.    . carvedilol (COREG) 12.5 MG tablet Take 1 tablet (12.5 mg total) by mouth 2 (two) times daily with a meal. 180 tablet 3  . clopidogrel (PLAVIX) 75 MG tablet Take 1 tablet (75 mg total) by mouth daily. 90 tablet 3  . clotrimazole (LOTRIMIN) 1 % cream Apply 1 application topically 2 (two) times daily as needed (itching).     . furosemide (LASIX) 40 MG tablet Take 1 tablet (40 mg total) by mouth daily as needed. (Patient taking differently: Take 40 mg by mouth daily as needed for fluid or edema. ) 30 tablet 6  . glimepiride (AMARYL) 4 MG tablet Take 4 mg by mouth 2 (two) times daily.     [name] HYDROcodone-acetaminophen (NORCO/VICODIN) 5-325 MG per tablet Take 1-2 tablets by mouth every 4 (four) hours as needed for moderate pain (breakthrough pain). 60 tablet 0  . linagliptin (TRADJENTA) 5 MG TABS tablet Take 5 mg by mouth daily after breakfast.     . meclizine (ANTIVERT) 25 MG  tablet Take 25 mg by mouth 4 (four) times daily as needed. For dizziness    . Menthol-Methyl Salicylate (MUSCLE RUB EX) Apply 1 application topically 2 (two) times daily as needed (aches).     . nitroGLYCERIN (NITROSTAT) 0.4 MG SL tablet Place 1 tablet (0.4 mg total) under the tongue every 5 (five) minutes as needed for chest pain. 25 tablet 4  . oxyCODONE-acetaminophen (PERCOCET) 10-325 MG per tablet Take 1 tablet by mouth every 6 (six) hours.    [name] RANITIDINE HCL PO Take 1 tablet by mouth daily as needed (heartburn or indigestion).     . SENNA CO Take 1 tablet by mouth daily as needed (constipation).     [name] spironolactone (ALDACTONE) 25 MG tablet Take 1 tablet (25 mg total) by mouth every morning. 90 tablet 3  . traMADol (ULTRAM) 50 MG tablet Take 50 mg by mouth 3 (three) times daily as needed (pain).      No current facility-administered medications for this visit.     Past Medical History  Diagnosis Date  . Sinus bradycardia   . Tobacco abuse   . Chronic renal insufficiency   . Osteoarthritis   . Diabetes mellitus   . Mitral regurgitation   . Systolic CHF, chronic     Previously taken off lisinopril by primary doctor due  to labs  . Carotid stenosis     a. Prior carotid dz/ surgery. b. Carotid dopp 07/2011 - 0-39% bilaterally.;   c. Doppler 11/13: 40-59% RICA, 0-39% LICA  . Cardiomyopathy, ischemic     a. Chronic systolic CHF s/p St. Jude dual chamber ICD 03/2008.  [name] Hx of CABG   . CAD (coronary artery disease)     a. STEMI s/p CABG 07/2007 (had preop cardiogenic shock, IABP, VDRF before surgery)  . Stroke     a. CVA 1990s - chronic pain in RUE after stroke.  [name] PAF (paroxysmal atrial fibrillation)     a. Poor Coumadin candidate due to hx of GI bleed.  . GI bleed     Reported history  . Splenomegaly   . Myocardial infarction   . Automatic implantable cardioverter-defibrillator in situ   . Hypertension     [name]   . Sleep apnea     study done, 10 yrs ago, unable  to tolerate CPAP  . Neuromuscular disorder     R sided weakness   . Anemia     Multifactorial  - hx of B12 def and iron deficiency, [facility]    ROS:   All systems reviewed and negative except as noted in the HPI.   Past Surgical History  Procedure Laterality Date  . Coronary artery bypass graft    . Percutaneous coronary intervention  01/10/2005    Charlies Constable, MD  . Coronary artery bypass graft  08/29/2007    x3; Salvatore Decent. Cornelius Moras MD  . Cardiac defibrillator placement  04/05/2008    Implantation of a St. Jude dual-chamber defibrillator, Doylene Canning. Ladona Ridgel , MD  . Cardiac defibrillator placement    . Tonsillectomy    . Joint replacement  1990's    L knee  . Total knee arthroplasty Right 08/16/2013    Procedure: RIGHT TOTAL KNEE ARTHROPLASTY;  Surgeon: Kathryne Hitch, MD;  Location: [facility] OR;  Service: Orthopedics;  Laterality: Right;     Family History  Problem Relation Age of Onset  . Heart attack Mother     CVA, MI  . Diabetes Mother   . Heart attack Father     MI  . Diabetes Sister   . Coronary artery disease Brother     CABG     History   Social History  . Marital Status: Married    Spouse Name: N/A  . Number of Children: N/A  . Years of Education: N/A   Occupational History  . Retired    Social History Main Topics  . Smoking status: Former Smoker -- 1.00 packs/day for 15 years    Quit date: 09/29/1990  . Smokeless tobacco: Former Neurosurgeon    Quit date: 08/09/1988  . Alcohol Use: No  . Drug Use: No  . Sexual Activity: Not on file   Other Topics Concern  . Not on file   Social History Narrative   Lives in [place] with wife   Been on disability since his stroke in the 1990s   Not routinely exercising     BP 138/76 mmHg  Pulse 61  Ht  (1.854 m)  Wt 214 lb 6.4 oz (97.251 kg)  BMI 28.29 kg/m2  Physical Exam:  stable appearing 69 year old man, NAD HEENT: Unremarkable Neck:  7 cm JVD, no  thyromegally Back:  No CVA tenderness Lungs:  Clear with no wheezes, rales, or rhonchi. HEART:  Regular rate rhythm, no  murmurs, no rubs, no clicks Abd:  soft, positive bowel sounds, no organomegally, no rebound, no guarding Ext:  2 plus pulses, trace peripheral edema, no cyanosis, no clubbing Skin:  No rashes no nodules Neuro:  CN II through XII intact, motor grossly intact  DEVICE  Normal device function.  See PaceArt for details.   Assess/Plan:

## 2015-02-22 NOTE — Assessment & Plan Note (Signed)
His blood pressure is well controlled. He will continue his current meds. 

## 2015-02-27 ENCOUNTER — Ambulatory Visit (HOSPITAL_BASED_OUTPATIENT_CLINIC_OR_DEPARTMENT_OTHER): Payer: Medicare HMO

## 2015-02-27 VITALS — BP 123/59 | HR 59 | Temp 97.8°F

## 2015-02-27 DIAGNOSIS — E538 Deficiency of other specified B group vitamins: Secondary | ICD-10-CM

## 2015-02-27 DIAGNOSIS — D509 Iron deficiency anemia, unspecified: Secondary | ICD-10-CM

## 2015-02-27 MED ORDER — CYANOCOBALAMIN 1000 MCG/ML IJ SOLN
1000.0000 ug | Freq: Once | INTRAMUSCULAR | Status: AC
  Administered 2015-02-27: 1000 ug via INTRAMUSCULAR

## 2015-03-27 ENCOUNTER — Telehealth: Payer: Self-pay

## 2015-03-27 ENCOUNTER — Ambulatory Visit (HOSPITAL_BASED_OUTPATIENT_CLINIC_OR_DEPARTMENT_OTHER): Payer: Medicare HMO

## 2015-03-27 VITALS — BP 127/55 | HR 60 | Temp 97.7°F | Resp 16

## 2015-03-27 DIAGNOSIS — E538 Deficiency of other specified B group vitamins: Secondary | ICD-10-CM | POA: Diagnosis not present

## 2015-03-27 DIAGNOSIS — D509 Iron deficiency anemia, unspecified: Secondary | ICD-10-CM

## 2015-03-27 MED ORDER — CYANOCOBALAMIN 1000 MCG/ML IJ SOLN
1000.0000 ug | Freq: Once | INTRAMUSCULAR | Status: AC
  Administered 2015-03-27: 1000 ug via INTRAMUSCULAR

## 2015-03-27 NOTE — Telephone Encounter (Signed)
lvm [name] needs to r/s his inj appt to later today or another day.

## 2015-04-24 ENCOUNTER — Ambulatory Visit (HOSPITAL_BASED_OUTPATIENT_CLINIC_OR_DEPARTMENT_OTHER): Payer: Medicare HMO

## 2015-04-24 VITALS — BP 131/65 | HR 60 | Temp 97.7°F | Resp 18

## 2015-04-24 DIAGNOSIS — E538 Deficiency of other specified B group vitamins: Secondary | ICD-10-CM

## 2015-04-24 DIAGNOSIS — D509 Iron deficiency anemia, unspecified: Secondary | ICD-10-CM

## 2015-04-24 MED ORDER — CYANOCOBALAMIN 1000 MCG/ML IJ SOLN
1000.0000 ug | Freq: Once | INTRAMUSCULAR | Status: AC
  Administered 2015-04-24: 1000 ug via INTRAMUSCULAR

## 2015-04-24 NOTE — Patient Instructions (Signed)

## 2015-05-22 ENCOUNTER — Ambulatory Visit (HOSPITAL_BASED_OUTPATIENT_CLINIC_OR_DEPARTMENT_OTHER): Payer: Medicare HMO

## 2015-05-22 VITALS — BP 134/64 | HR 60 | Temp 97.5°F

## 2015-05-22 DIAGNOSIS — E538 Deficiency of other specified B group vitamins: Secondary | ICD-10-CM

## 2015-05-22 DIAGNOSIS — D509 Iron deficiency anemia, unspecified: Secondary | ICD-10-CM

## 2015-05-22 MED ORDER — CYANOCOBALAMIN 1000 MCG/ML IJ SOLN
1000.0000 ug | Freq: Once | INTRAMUSCULAR | Status: AC
  Administered 2015-05-22: 1000 ug via INTRAMUSCULAR

## 2015-05-24 ENCOUNTER — Encounter: Payer: Medicare HMO | Admitting: *Deleted

## 2015-05-24 ENCOUNTER — Telehealth: Payer: Self-pay | Admitting: Cardiology

## 2015-05-24 NOTE — Telephone Encounter (Signed)
LMOVM reminding pt to send remote transmission.   

## 2015-05-29 ENCOUNTER — Encounter: Payer: Self-pay | Admitting: Cardiology

## 2015-06-19 ENCOUNTER — Ambulatory Visit (HOSPITAL_BASED_OUTPATIENT_CLINIC_OR_DEPARTMENT_OTHER): Payer: Medicare HMO

## 2015-06-19 VITALS — BP 130/54 | HR 60 | Temp 97.9°F

## 2015-06-19 DIAGNOSIS — E538 Deficiency of other specified B group vitamins: Secondary | ICD-10-CM

## 2015-06-19 DIAGNOSIS — D509 Iron deficiency anemia, unspecified: Secondary | ICD-10-CM

## 2015-06-19 MED ORDER — CYANOCOBALAMIN 1000 MCG/ML IJ SOLN
1000.0000 ug | Freq: Once | INTRAMUSCULAR | Status: AC
Start: 2015-06-19 — End: 2015-06-19
  Administered 2015-06-19: 1000 ug via INTRAMUSCULAR

## 2015-06-27 ENCOUNTER — Encounter: Payer: Self-pay | Admitting: *Deleted

## 2015-07-06 ENCOUNTER — Telehealth: Payer: Self-pay | Admitting: Internal Medicine

## 2015-07-06 ENCOUNTER — Encounter: Payer: Self-pay | Admitting: Internal Medicine

## 2015-07-06 ENCOUNTER — Ambulatory Visit (INDEPENDENT_AMBULATORY_CARE_PROVIDER_SITE_OTHER): Payer: Medicare HMO | Admitting: *Deleted

## 2015-07-06 DIAGNOSIS — I255 Ischemic cardiomyopathy: Secondary | ICD-10-CM

## 2015-07-06 NOTE — Telephone Encounter (Signed)
Patient called regarding the letter he received about the office not getting his remote. I instructed him on how to send a manual transmission. Patient states that he is unable to send at right now bc he is not at home. I explained to him that he could send whenever he gets a chance and if he has any problems he can call the 800# or call the ofc on Monday for help. Patient voiced understanding.

## 2015-07-06 NOTE — Telephone Encounter (Signed)
New Message  Pt has questions reguarding his device   Please give him a call back  Pt left vm

## 2015-07-09 NOTE — Progress Notes (Signed)
Remote ICD transmission.   

## 2015-07-12 LAB — CUP PACEART REMOTE DEVICE CHECK
Battery Remaining Longevity: 1 mo
Brady Statistic AP VP Percent: 1.5 %
Brady Statistic AP VS Percent: 58 %
Brady Statistic AS VP Percent: 1 %
Brady Statistic AS VS Percent: 40 %
Brady Statistic RV Percent Paced: 1.8 %
Date Time Interrogation Session: 20161007215418
HighPow Impedance: 60 Ohm
HighPow Impedance: 65 Ohm
Implantable Lead Implant Date: 20090708
Implantable Lead Location: 753859
Implantable Lead Location: 753860
Lead Channel Pacing Threshold Pulse Width: 0.5 ms
Lead Channel Setting Pacing Amplitude: 2 V
MDC IDC LEAD IMPLANT DT: 20090708
MDC IDC LEAD MODEL: 7122
MDC IDC MSMT BATTERY VOLTAGE: 2.48 V
MDC IDC MSMT LEADCHNL RA IMPEDANCE VALUE: 330 Ohm
MDC IDC MSMT LEADCHNL RA PACING THRESHOLD AMPLITUDE: 0.5 V
MDC IDC MSMT LEADCHNL RA PACING THRESHOLD PULSEWIDTH: 0.5 ms
MDC IDC MSMT LEADCHNL RA SENSING INTR AMPL: 2.5 mV
MDC IDC MSMT LEADCHNL RV IMPEDANCE VALUE: 300 Ohm
MDC IDC MSMT LEADCHNL RV PACING THRESHOLD AMPLITUDE: 1 V
MDC IDC MSMT LEADCHNL RV SENSING INTR AMPL: 11.7 mV
MDC IDC SET LEADCHNL RV PACING AMPLITUDE: 2.5 V
MDC IDC SET LEADCHNL RV PACING PULSEWIDTH: 0.5 ms
MDC IDC SET LEADCHNL RV SENSING SENSITIVITY: 0.2 mV
MDC IDC SET ZONE DETECTION INTERVAL: 320 ms
MDC IDC SET ZONE VENDOR TYPE: 773185
MDC IDC STAT BRADY RA PERCENT PACED: 58 %
Pulse Gen Serial Number: 541189
Zone Setting Vendor Type Category: 773188

## 2015-07-17 ENCOUNTER — Ambulatory Visit (HOSPITAL_BASED_OUTPATIENT_CLINIC_OR_DEPARTMENT_OTHER): Payer: Medicare HMO

## 2015-07-17 ENCOUNTER — Telehealth: Payer: Self-pay | Admitting: Oncology

## 2015-07-17 ENCOUNTER — Ambulatory Visit (HOSPITAL_BASED_OUTPATIENT_CLINIC_OR_DEPARTMENT_OTHER): Payer: Medicare HMO | Admitting: Oncology

## 2015-07-17 ENCOUNTER — Ambulatory Visit: Payer: Medicare HMO

## 2015-07-17 VITALS — BP 140/70 | HR 57 | Temp 97.8°F | Resp 16 | Ht 73.0 in | Wt 216.1 lb

## 2015-07-17 DIAGNOSIS — D638 Anemia in other chronic diseases classified elsewhere: Secondary | ICD-10-CM

## 2015-07-17 DIAGNOSIS — D538 Other specified nutritional anemias: Secondary | ICD-10-CM | POA: Diagnosis not present

## 2015-07-17 DIAGNOSIS — Z8673 Personal history of transient ischemic attack (TIA), and cerebral infarction without residual deficits: Secondary | ICD-10-CM | POA: Diagnosis not present

## 2015-07-17 DIAGNOSIS — D509 Iron deficiency anemia, unspecified: Secondary | ICD-10-CM

## 2015-07-17 MED ORDER — CYANOCOBALAMIN 1000 MCG/ML IJ SOLN
1000.0000 ug | Freq: Once | INTRAMUSCULAR | Status: AC
  Administered 2015-07-17: 1000 ug via INTRAMUSCULAR

## 2015-07-17 NOTE — Progress Notes (Signed)
[facility] HEMATOLOGY OFFICE PROGRESS NOTE 07/17/2015   [name] Jester, DO [address] [place] [address]  DIAGNOSIS: 69 year old gentleman with multifactorial anemia. He has an element of iron deficiency, B12 and anemia of chronic disease. This was diagnosed in 2008.     CURRENT THERAPY: Monthly Vitamin B-12 1000 mcg IM injection. These injections were restarted on 08/05/2012.  INTERVAL HISTORY:  [name] presents today for a follow-up visit by himself. Since the last visit, he reports no complications related to vitamin B12 injections. He has not reported any injection-related complications. He does not report any fatigue or tiredness or deterioration in his health. He has not had any hospitalizations or illnesses. He still able to drive and attends to activities of daily living. He has not reported any bleeding such as hematochezia or melena. He continues to care for his wife who is disabled and has to do a lot of physical activities related to that.  He does not report any headaches, blurry vision, syncope or seizures. He does not report any other strokes. He is not report any chest pain, palpitation, orthopnea. He does not report any cough, wheezing or hemoptysis. He does not report any nausea, vomiting, abdominal pain. He does not report any musca skeletal complaints. He does not report any frequency urgency or hesitancy. Remainder review of systems unremarkable.   IALLERGIES:  is allergic to codeine.  MEDICATIONS:   Current Outpatient Prescriptions  Medication Sig Dispense Refill  . aspirin EC 81 MG EC tablet Take 1 tablet (81 mg total) by mouth daily.    [name] atorvastatin (LIPITOR) 80 MG tablet Take 0.5 tablets (40 mg total) by mouth every other day. 90 tablet 3  . baclofen (LIORESAL) 20 MG tablet Take 40 mg by mouth at bedtime.     [name] BISACODYL PO Take 1 tablet by mouth daily as needed (constipation).     . calcium carbonate (TUMS - DOSED IN MG ELEMENTAL  CALCIUM) 500 MG chewable tablet Chew 1 tablet by mouth 3 (three) times daily as needed for indigestion or heartburn.     . Canagliflozin (INVOKANA) 300 MG TABS Take 1 tablet by mouth daily.    . carvedilol (COREG) 12.5 MG tablet Take 1 tablet (12.5 mg total) by mouth 2 (two) times daily with a meal. 180 tablet 3  . clopidogrel (PLAVIX) 75 MG tablet Take 1 tablet (75 mg total) by mouth daily. 90 tablet 3  . furosemide (LASIX) 40 MG tablet Take 1 tablet (40 mg total) by mouth daily as needed. (Patient taking differently: Take 40 mg by mouth daily as needed for fluid or edema. ) 30 tablet 6  . glimepiride (AMARYL) 4 MG tablet Take 4 mg by mouth 2 (two) times daily.     [name] linagliptin (TRADJENTA) 5 MG TABS tablet Take 5 mg by mouth daily after breakfast.     . meclizine (ANTIVERT) 25 MG tablet Take 25 mg by mouth 4 (four) times daily as needed. For dizziness    . nitroGLYCERIN (NITROSTAT) 0.4 MG SL tablet Place 1 tablet (0.4 mg total) under the tongue every 5 (five) minutes as needed for chest pain. 25 tablet 4  . oxyCODONE-acetaminophen (PERCOCET) 10-325 MG per tablet Take 1 tablet by mouth every 6 (six) hours.    [name] RANITIDINE HCL PO Take 1 tablet by mouth daily as needed (heartburn or indigestion).     . SENNA CO Take 1 tablet by mouth daily as needed (constipation).     [name]  spironolactone (ALDACTONE) 25 MG tablet Take 1 tablet (25 mg total) by mouth every morning. 90 tablet 3   No current facility-administered medications for this visit.     PHYSICAL EXAMINATION: ECOG PERFORMANCE STATUS: 0-1  Blood pressure 140/70, pulse 57, temperature 97.8 F (36.6 C), temperature source Oral, resp. rate 16, height  (1.854 m), weight 216 lb 1.6 oz (98.022 kg), SpO2 100 %.  GENERAL:alert, awake gentleman without distress. SKIN:, no rashes or significant lesions EYES: normal, Conjunctiva are pink and non-injected.  OROPHARYNX:no exudate, no erythema.. No oral thrush noted. NECK: supple,Scars well healed  on right neck area.   LYMPH:  no palpable lymphadenopathy in the cervical, axillary or supraclavicular LUNGS: clear to auscultation and percussion with normal breathing effort no dullness to percussion. HEART: regular rate & rhythm and no murmurs and no lower extremity edema;+ Implanted ICD on the right infraclavicular area.   ABDOMEN:abdomen soft, non-tender and normal bowel sounds.  Musculoskeletal:no cyanosis of digits and no clubbing  NEURO: Right-sided weakness noted. Ambulation was normal.    Labs:  Lab Results  Component Value Date   WBC 8.3 01/02/2015   HGB 13.0 01/02/2015   HCT 39.9 01/02/2015   MCV 84.9 01/02/2015   PLT 183 01/02/2015   NEUTROABS 6.0 01/02/2015      Chemistry      Component Value Date/Time   NA 142 01/02/2015 0831   NA 138 11/18/2013 1143   K 4.2 01/02/2015 0831   K 4.2 11/18/2013 1143   CL 99 11/18/2013 1143   CL 100 01/06/2013 0830   CO2 25 01/02/2015 0831   CO2 25 11/18/2013 1143   BUN 17.9 01/02/2015 0831   BUN 17 11/18/2013 1143   CREATININE 1.1 01/02/2015 0831   CREATININE 0.98 11/18/2013 1143      Component Value Date/Time   CALCIUM 8.8 01/02/2015 0831   CALCIUM 9.1 11/18/2013 1143   ALKPHOS 94 01/02/2015 0831   ALKPHOS 98 11/18/2013 1143   AST 15 01/02/2015 0831   AST 13 11/18/2013 1143   ALT 10 01/02/2015 0831   ALT 9 11/18/2013 1143   BILITOT 0.51 01/02/2015 0831   BILITOT 0.4 11/18/2013 1143       Assessment and plan:    69 year old gentleman with the following issues:   1. Anemia: This appears to be multifactorial in nature with element of iron deficiency, B12 on chronic disease. He responded well to B12 injections and the plan is to continue with monthly injection and check his B12 levels every 6 months. His hemoglobin is pending from today but in April 2016 was within normal range. I will vitamin B12 levels today and repeat in 6 months. He feels that his energy and performance status have improved and able to attend to  her activities of daily living including caring for his wife while he is receiving those injections.  2. History of stroke: Very mild deficits on the right side but able to ambulate and dry without any difficulty.  3. Follow-up: Will be in 6 months sooner if needed to.   [facility] MD 07/17/2015

## 2015-07-17 NOTE — Telephone Encounter (Signed)
Gave and printd appt sched and avs for pt for NOV thru April 2017

## 2015-07-27 ENCOUNTER — Encounter: Payer: Self-pay | Admitting: Cardiology

## 2015-08-07 ENCOUNTER — Telehealth: Payer: Self-pay | Admitting: Cardiology

## 2015-08-07 ENCOUNTER — Ambulatory Visit (INDEPENDENT_AMBULATORY_CARE_PROVIDER_SITE_OTHER): Payer: Medicare HMO | Admitting: *Deleted

## 2015-08-07 DIAGNOSIS — Z95810 Presence of automatic (implantable) cardiac defibrillator: Secondary | ICD-10-CM

## 2015-08-07 NOTE — Telephone Encounter (Signed)
LMOVM reminding pt to send remote transmission.   

## 2015-08-08 NOTE — Progress Notes (Signed)
Remote ICD transmission.   

## 2015-08-09 LAB — CUP PACEART REMOTE DEVICE CHECK
Battery Voltage: 2.45 V
Brady Statistic AP VP Percent: 1.8 %
Brady Statistic AS VP Percent: 1 %
Brady Statistic AS VS Percent: 38 %
Brady Statistic RV Percent Paced: 2.2 %
Date Time Interrogation Session: 20161109035920
HIGH POWER IMPEDANCE MEASURED VALUE: 63 Ohm
HighPow Impedance: 63 Ohm
Implantable Lead Implant Date: 20090708
Lead Channel Impedance Value: 310 Ohm
Lead Channel Sensing Intrinsic Amplitude: 11.7 mV
Lead Channel Sensing Intrinsic Amplitude: 2.6 mV
Lead Channel Setting Pacing Pulse Width: 0.5 ms
Lead Channel Setting Sensing Sensitivity: 0.2 mV
MDC IDC LEAD IMPLANT DT: 20090708
MDC IDC LEAD LOCATION: 753859
MDC IDC LEAD LOCATION: 753860
MDC IDC LEAD MODEL: 7122
MDC IDC MSMT BATTERY REMAINING LONGEVITY: 1 mo
MDC IDC MSMT LEADCHNL RA IMPEDANCE VALUE: 330 Ohm
MDC IDC PG SERIAL: 541189
MDC IDC SET LEADCHNL RA PACING AMPLITUDE: 2 V
MDC IDC SET LEADCHNL RV PACING AMPLITUDE: 2.5 V
MDC IDC STAT BRADY AP VS PERCENT: 58 %
MDC IDC STAT BRADY RA PERCENT PACED: 59 %

## 2015-08-10 ENCOUNTER — Encounter: Payer: Self-pay | Admitting: Cardiology

## 2015-08-14 ENCOUNTER — Ambulatory Visit (HOSPITAL_BASED_OUTPATIENT_CLINIC_OR_DEPARTMENT_OTHER): Payer: Medicare HMO

## 2015-08-14 VITALS — BP 123/61 | HR 60 | Temp 97.5°F | Resp 18

## 2015-08-14 DIAGNOSIS — E538 Deficiency of other specified B group vitamins: Secondary | ICD-10-CM | POA: Diagnosis not present

## 2015-08-14 DIAGNOSIS — D509 Iron deficiency anemia, unspecified: Secondary | ICD-10-CM

## 2015-08-14 MED ORDER — CYANOCOBALAMIN 1000 MCG/ML IJ SOLN
1000.0000 ug | Freq: Once | INTRAMUSCULAR | Status: AC
  Administered 2015-08-14: 1000 ug via INTRAMUSCULAR

## 2015-09-02 ENCOUNTER — Encounter: Payer: Self-pay | Admitting: Internal Medicine

## 2015-09-03 ENCOUNTER — Telehealth: Payer: Self-pay | Admitting: Cardiology

## 2015-09-03 NOTE — Telephone Encounter (Signed)
LMOVM for pt to return call. He reached ERI on 09-01-15.

## 2015-09-04 NOTE — Telephone Encounter (Signed)
Spoke w/ pt and informed him that we received an alert notifying us that his battery has reached ERI. Informed him that at this point his battery has about 3 more months. A scheduler will call and schedule him an appt either w/ MD / PA / NP. Pt verbalized understanding.

## 2015-09-07 ENCOUNTER — Encounter: Payer: Self-pay | Admitting: Internal Medicine

## 2015-09-07 ENCOUNTER — Encounter: Payer: Self-pay | Admitting: *Deleted

## 2015-09-07 ENCOUNTER — Ambulatory Visit (INDEPENDENT_AMBULATORY_CARE_PROVIDER_SITE_OTHER): Payer: Medicare HMO | Admitting: Internal Medicine

## 2015-09-07 VITALS — BP 140/76 | HR 60 | Ht 73.0 in | Wt 222.0 lb

## 2015-09-07 DIAGNOSIS — I10 Essential (primary) hypertension: Secondary | ICD-10-CM | POA: Diagnosis not present

## 2015-09-07 DIAGNOSIS — I255 Ischemic cardiomyopathy: Secondary | ICD-10-CM

## 2015-09-07 DIAGNOSIS — Z01812 Encounter for preprocedural laboratory examination: Secondary | ICD-10-CM | POA: Diagnosis not present

## 2015-09-07 DIAGNOSIS — Z95810 Presence of automatic (implantable) cardiac defibrillator: Secondary | ICD-10-CM

## 2015-09-07 NOTE — Progress Notes (Signed)
HPI [name] returns today for followup. He is a very pleasant 69 year old man with an ischemic cardiomyopathy, chronic systolic heart failure, status post ICD implantation. In the interim, he has been stable. He denies chest pain or shortness of breath. He has mild peripheral edema.  He denies syncope or near-syncope. No chest pain. He has reached ERI on his ICD. He has had no ICD shock. He reports that his blood sugar has been much improved. Allergies  Allergen Reactions  . Codeine Nausea And Vomiting     Current Outpatient Prescriptions  Medication Sig Dispense Refill  . aspirin EC 81 MG EC tablet Take 1 tablet (81 mg total) by mouth daily.    [name] atorvastatin (LIPITOR) 80 MG tablet Take 0.5 tablets (40 mg total) by mouth every other day. 90 tablet 3  . baclofen (LIORESAL) 20 MG tablet Take 40 mg by mouth at bedtime.     [name] BISACODYL PO Take 1 tablet by mouth daily as needed (constipation).     . calcium carbonate (TUMS - DOSED IN MG ELEMENTAL CALCIUM) 500 MG chewable tablet Chew 1 tablet by mouth 3 (three) times daily as needed for indigestion or heartburn.     . Canagliflozin (INVOKANA) 300 MG TABS Take 1 tablet by mouth daily.    . carvedilol (COREG) 12.5 MG tablet Take 1 tablet (12.5 mg total) by mouth 2 (two) times daily with a meal. 180 tablet 3  . clopidogrel (PLAVIX) 75 MG tablet Take 1 tablet (75 mg total) by mouth daily. 90 tablet 3  . furosemide (LASIX) 40 MG tablet Take 1 tablet (40 mg total) by mouth daily as needed. (Patient taking differently: Take 40 mg by mouth daily as needed for fluid or edema. ) 30 tablet 6  . glimepiride (AMARYL) 4 MG tablet Take 4 mg by mouth 2 (two) times daily.     [name] linagliptin (TRADJENTA) 5 MG TABS tablet Take 5 mg by mouth daily after breakfast.     . meclizine (ANTIVERT) 25 MG tablet Take 25 mg by mouth 4 (four) times daily as needed. For dizziness    . metoCLOPramide (REGLAN) 10 MG tablet Take 10 mg by mouth daily.    . nitroGLYCERIN (NITROSTAT)  0.4 MG SL tablet Place 1 tablet (0.4 mg total) under the tongue every 5 (five) minutes as needed for chest pain. 25 tablet 4  . oxyCODONE-acetaminophen (PERCOCET) 10-325 MG per tablet Take 1 tablet by mouth every 6 (six) hours.    [name] RANITIDINE HCL PO Take 1 tablet by mouth daily as needed (heartburn or indigestion).     . SENNA CO Take 1 tablet by mouth daily as needed (constipation).     [name] spironolactone (ALDACTONE) 25 MG tablet Take 1 tablet (25 mg total) by mouth every morning. 90 tablet 3   No current facility-administered medications for this visit.     Past Medical History  Diagnosis Date  . Sinus bradycardia   . Tobacco abuse   . Chronic renal insufficiency   . Osteoarthritis   . Diabetes mellitus   . Mitral regurgitation   . Systolic CHF, chronic (HCC)     Previously taken off lisinopril by primary doctor due to labs  . Carotid stenosis     a. Prior carotid dz/ surgery. b. Carotid dopp 07/2011 - 0-39% bilaterally.;   c. Doppler 11/13: 40-59% RICA, 0-39% LICA  . Cardiomyopathy, ischemic     a. Chronic systolic CHF s/p St. Jude dual chamber ICD 03/2008.  [name]  Hx of CABG   . CAD (coronary artery disease)     a. STEMI s/p CABG 07/2007 (had preop cardiogenic shock, IABP, VDRF before surgery)  . Stroke [facility])     a. CVA 1990s - chronic pain in RUE after stroke.  [name] PAF (paroxysmal atrial fibrillation) (HCC)     a. Poor Coumadin candidate due to hx of GI bleed.  . GI bleed     Reported history  . Splenomegaly   . Myocardial infarction (HCC)   . Automatic implantable cardioverter-defibrillator in situ   . Hypertension     [place] [facility]   . Sleep apnea     study done, 10 yrs ago, unable to tolerate CPAP  . Neuromuscular disorder (HCC)     R sided weakness   . Anemia     Multifactorial  - hx of B12 def and iron deficiency, [facility]    ROS:   All systems reviewed and negative except as noted in the HPI.   Past Surgical History   Procedure Laterality Date  . Coronary artery bypass graft    . Percutaneous coronary intervention  01/10/2005    Charlies Constable, MD  . Coronary artery bypass graft  08/29/2007    x3; Salvatore Decent. Cornelius Moras MD  . Cardiac defibrillator placement  04/05/2008    Implantation of a St. Jude dual-chamber defibrillator, Doylene Canning. Ladona Ridgel , MD  . Cardiac defibrillator placement    . Tonsillectomy    . Joint replacement  1990's    L knee  . Total knee arthroplasty Right 08/16/2013    Procedure: RIGHT TOTAL KNEE ARTHROPLASTY;  Surgeon: Kathryne Hitch, MD;  Location: [facility] OR;  Service: Orthopedics;  Laterality: Right;     Family History  Problem Relation Age of Onset  . Heart attack Mother     CVA, MI  . Diabetes Mother   . Heart attack Father     MI  . Diabetes Sister   . Coronary artery disease Brother     CABG     Social History   Social History  . Marital Status: Married    Spouse Name: N/A  . Number of Children: N/A  . Years of Education: N/A   Occupational History  . Retired    Social History Main Topics  . Smoking status: Former Smoker -- 1.00 packs/day for 15 years    Quit date: 09/29/1990  . Smokeless tobacco: Former Neurosurgeon    Quit date: 08/09/1988  . Alcohol Use: No  . Drug Use: No  . Sexual Activity: Not on file   Other Topics Concern  . Not on file   Social History Narrative   Lives in [place] with wife   Been on disability since his stroke in the 1990s   Not routinely exercising     BP 140/76 mmHg  Pulse 60  Ht  (1.854 m)  Wt 222 lb (100.699 kg)  BMI 29.30 kg/m2  Physical Exam:  stable appearing 69 year old man, NAD HEENT: Unremarkable Neck:  7 cm JVD, no thyromegally Back:  No CVA tenderness Lungs:  Clear with no wheezes, rales, or rhonchi. HEART:  Regular rate rhythm, no murmurs, no rubs, no clicks Abd:  soft, positive bowel sounds, no organomegally, no rebound, no guarding Ext:  2 plus pulses, trace peripheral edema, no cyanosis, no  clubbing Skin:  No rashes no nodules Neuro:  CN II through XII intact, motor grossly intact  DEVICE  Normal  device function.  See PaceArt for details. Device at [facility]  Assess/Plan:

## 2015-09-07 NOTE — Patient Instructions (Addendum)
Medication Instructions:  Your physician recommends that you continue on your current medications as directed. Please refer to the Current Medication list given to you today.  Labwork: Your physician recommends that you return for pre-procedure lab work the week of December 26-30.  Testing/Procedures: Your physician has requested that you have an echocardiogram (prior to pacemaker battery change on 10/05/15). Echocardiography is a painless test that uses sound waves to create images of your heart. It provides your doctor with information about the size and shape of your heart and how well your heart's chambers and valves are working. This procedure takes approximately one hour. There are no restrictions for this procedure.  Your physician has recommended that you have a defibrillator inserted. An implantable cardioverter defibrillator (ICD) is a small device that is placed in your chest or, in rare cases, your abdomen. This device uses electrical pulses or shocks to help control life-threatening, irregular heartbeats that could lead the heart to suddenly stop beating (sudden cardiac arrest). Leads are attached to the ICD that goes into your heart. This is done in the hospital and usually requires an overnight stay. Please see the instruction sheet given to you today for more information.  Follow-Up: Your physician recommends that you schedule a follow-up appointment in: 10-14 days, after 10/05/15 , with device clinic for wound check.  Your physician recommends that you schedule a follow-up appointment in: 3 months, after 10/05/15, with Dr. Ladona Ridgel.   If you need a refill on your cardiac medications before your next appointment, please call your pharmacy.  Thank you for choosing [facility]!!     Pacemaker Battery Change A pacemaker battery usually lasts 4 to 12 years. Once or twice per year, you will be asked to visit your health care provider to have a full evaluation of your pacemaker. When a  battery needs to be replaced, the entire pacemaker is replaced so that you can benefit from new circuitry and any new features that have been added to pacemakers. Most often, this procedure is very simple because the leads are already in place.  There are many things that affect how long a pacemaker battery will last, including:   The age of the pacemaker.   The number of leads (1, 2, or 3).   The pacemaker work load. If the pacemaker is helping the heart more often, the battery will not last as long as it would if the pacemaker did not need to help the heart.   Power (voltage) settings. LET [facility] CARE PROVIDER KNOW ABOUT:   Any allergies you have.   All medicines you are taking, including vitamins, herbs, eye drops, creams, and over-the-counter medicines.   Previous problems you or members of your family have had with the use of anesthetics.   Any blood disorders you have.   Previous surgeries you have had, especially since your last pacemaker placement.   Medical conditions you have.   Possibility of pregnancy, if this applies.  Symptoms of chest pain, trouble breathing, palpitations, light-headedness, or feelings of an abnormal or irregular heartbeat. RISKS AND COMPLICATIONS  Generally, this is a safe procedure. However, as with any procedure, problems can occur and include:   Bleeding.   Bruising of the skin around where the incision was made.   Pain at the incision site.   Pulling apart of the skin at the incision site.   Infection.   Allergic reaction to anesthetics or other medicines used during the procedure.  People with diabetes may have  a temporary increase in their blood sugar after any surgical procedure.  BEFORE THE PROCEDURE   Wash all of the skin around the area of the chest where the pacemaker is located.   Ask your health care provider for help with any medicine adjustments before the pacemaker is replaced.   Do not eat or  drink anything after midnight on the night before the procedure or as directed by your health care provider.  Ask your health care provider if you can take a sip of water with any approved medicines the morning of the procedure. PROCEDURE   After giving medicine to numb the skin (local anesthetic), your health care provider will make a cut to reopen the pocket holding the pacemaker.   The old pacemaker will be disconnected from its leads.   The leads will be tested.   If needed, the leads will be replaced. If the leads are functioning properly, the new pacemaker may be connected to the existing leads.  A heart monitor and the pacemaker programmer will be used to make sure that the new pacemaker is working properly.  The incision site will then be closed. A dressing will be placed over the pacemaker site. The dressing will be removed 24-48 hours afterward. AFTER THE PROCEDURE   You will be taken to a recovery area after the new pacemaker implant is completed. Your vital signs such as blood pressure, heart rate, breathing, and oxygen levels will be monitored.  Your health care provider will tell you when you will need to next test your pacemaker or when to return to the office for follow-up for removal of stitches.   This information is not intended to replace advice given to you by your health care provider. Make sure you discuss any questions you have with your health care provider.   Document Released: 12/24/2006 Document Revised: 10/06/2014 Document Reviewed: 03/30/2013 Elsevier Interactive Patient Education [employer]

## 2015-09-07 NOTE — Addendum Note (Signed)
Addended by: Dennis Bast F on: 09/07/2015 05:10 PM   Modules accepted: Orders

## 2015-09-07 NOTE — Assessment & Plan Note (Signed)
His systolic blood pressure is elevated. He is encouraged to maintain a low sodium diet.

## 2015-09-07 NOTE — Assessment & Plan Note (Signed)
His device has reached ERI. Will plan to recheck an echo to be sure his LV function is still low.

## 2015-09-07 NOTE — Assessment & Plan Note (Signed)
His symptoms are well controlled. He has no anginal symptoms and his heart failure remains class 2. He will continue his current meds.

## 2015-09-11 ENCOUNTER — Ambulatory Visit (HOSPITAL_BASED_OUTPATIENT_CLINIC_OR_DEPARTMENT_OTHER): Payer: Medicare HMO

## 2015-09-11 VITALS — BP 125/58 | HR 60 | Temp 97.8°F

## 2015-09-11 DIAGNOSIS — E538 Deficiency of other specified B group vitamins: Secondary | ICD-10-CM

## 2015-09-11 DIAGNOSIS — D509 Iron deficiency anemia, unspecified: Secondary | ICD-10-CM

## 2015-09-11 MED ORDER — CYANOCOBALAMIN 1000 MCG/ML IJ SOLN
1000.0000 ug | Freq: Once | INTRAMUSCULAR | Status: AC
  Administered 2015-09-11: 1000 ug via INTRAMUSCULAR

## 2015-09-25 ENCOUNTER — Other Ambulatory Visit (HOSPITAL_COMMUNITY): Payer: Medicare HMO | Admitting: *Deleted

## 2015-09-25 ENCOUNTER — Other Ambulatory Visit (INDEPENDENT_AMBULATORY_CARE_PROVIDER_SITE_OTHER): Payer: Medicare HMO | Admitting: *Deleted

## 2015-09-25 ENCOUNTER — Ambulatory Visit (HOSPITAL_COMMUNITY): Payer: Medicare HMO | Attending: Cardiovascular Disease

## 2015-09-25 ENCOUNTER — Other Ambulatory Visit: Payer: Self-pay

## 2015-09-25 DIAGNOSIS — I10 Essential (primary) hypertension: Secondary | ICD-10-CM | POA: Insufficient documentation

## 2015-09-25 DIAGNOSIS — Z01812 Encounter for preprocedural laboratory examination: Secondary | ICD-10-CM

## 2015-09-25 DIAGNOSIS — Z951 Presence of aortocoronary bypass graft: Secondary | ICD-10-CM | POA: Diagnosis not present

## 2015-09-25 DIAGNOSIS — I252 Old myocardial infarction: Secondary | ICD-10-CM | POA: Insufficient documentation

## 2015-09-25 DIAGNOSIS — I255 Ischemic cardiomyopathy: Secondary | ICD-10-CM | POA: Diagnosis not present

## 2015-09-25 DIAGNOSIS — I517 Cardiomegaly: Secondary | ICD-10-CM | POA: Diagnosis not present

## 2015-09-25 DIAGNOSIS — I340 Nonrheumatic mitral (valve) insufficiency: Secondary | ICD-10-CM | POA: Insufficient documentation

## 2015-09-25 DIAGNOSIS — F17200 Nicotine dependence, unspecified, uncomplicated: Secondary | ICD-10-CM | POA: Insufficient documentation

## 2015-09-25 DIAGNOSIS — I059 Rheumatic mitral valve disease, unspecified: Secondary | ICD-10-CM | POA: Insufficient documentation

## 2015-09-25 DIAGNOSIS — E119 Type 2 diabetes mellitus without complications: Secondary | ICD-10-CM | POA: Insufficient documentation

## 2015-09-25 LAB — CBC WITH DIFFERENTIAL/PLATELET
BASOS ABS: 0 10*3/uL (ref 0.0–0.1)
Basophils Relative: 0 % (ref 0–1)
Eosinophils Absolute: 0.2 10*3/uL (ref 0.0–0.7)
Eosinophils Relative: 2 % (ref 0–5)
HEMATOCRIT: 39.2 % (ref 39.0–52.0)
HEMOGLOBIN: 12.7 g/dL — AB (ref 13.0–17.0)
LYMPHS PCT: 25 % (ref 12–46)
Lymphs Abs: 2 10*3/uL (ref 0.7–4.0)
MCH: 26.7 pg (ref 26.0–34.0)
MCHC: 32.4 g/dL (ref 30.0–36.0)
MCV: 82.5 fL (ref 78.0–100.0)
MPV: 11.1 fL (ref 8.6–12.4)
Monocytes Absolute: 1.1 10*3/uL — ABNORMAL HIGH (ref 0.1–1.0)
Monocytes Relative: 13 % — ABNORMAL HIGH (ref 3–12)
NEUTROS ABS: 4.9 10*3/uL (ref 1.7–7.7)
Neutrophils Relative %: 60 % (ref 43–77)
Platelets: 224 10*3/uL (ref 150–400)
RBC: 4.75 MIL/uL (ref 4.22–5.81)
RDW: 15.6 % — ABNORMAL HIGH (ref 11.5–15.5)
WBC: 8.1 10*3/uL (ref 4.0–10.5)

## 2015-09-25 LAB — BASIC METABOLIC PANEL
BUN: 19 mg/dL (ref 7–25)
CO2: 28 mmol/L (ref 20–31)
Calcium: 9.2 mg/dL (ref 8.6–10.3)
Chloride: 101 mmol/L (ref 98–110)
Creat: 1.07 mg/dL (ref 0.70–1.25)
GLUCOSE: 171 mg/dL — AB (ref 65–99)
POTASSIUM: 3.8 mmol/L (ref 3.5–5.3)
SODIUM: 138 mmol/L (ref 135–146)

## 2015-09-25 MED ORDER — PERFLUTREN LIPID MICROSPHERE
1.0000 mL | INTRAVENOUS | Status: AC | PRN
  Administered 2015-09-25: 2 mL via INTRAVENOUS

## 2015-09-26 ENCOUNTER — Telehealth: Payer: Self-pay | Admitting: Internal Medicine

## 2015-09-26 NOTE — Telephone Encounter (Signed)
Pt rtn call re Echo results-pls call at [phone]

## 2015-09-26 NOTE — Telephone Encounter (Signed)
Informed patient of results/KLanier,RN  Patient did not listen to the message I had left him.  I explained that the echo shows he still needs the ICD and will proceed with generator change on 10/05/15

## 2015-10-02 ENCOUNTER — Telehealth: Payer: Self-pay | Admitting: Cardiology

## 2015-10-02 NOTE — Telephone Encounter (Signed)
Pt called in and wanted to know if he needed someone with him when he had his device changed out. Informed pt that he would need someone to be there with him. Pt verbalized understanding.

## 2015-10-05 ENCOUNTER — Encounter (HOSPITAL_COMMUNITY)
Admission: RE | Disposition: A | Discharge status: Home / Self Care | Payer: Medicare HMO | Source: Ambulatory Visit | Attending: Internal Medicine

## 2015-10-05 ENCOUNTER — Encounter (HOSPITAL_COMMUNITY): Payer: Self-pay | Admitting: Internal Medicine

## 2015-10-05 ENCOUNTER — Ambulatory Visit (HOSPITAL_COMMUNITY)
Admission: RE | Admit: 2015-10-05 | Discharge: 2015-10-05 | Disposition: A | Discharge status: Home / Self Care | Payer: Medicare HMO | Source: Ambulatory Visit | Attending: Internal Medicine | Admitting: Internal Medicine

## 2015-10-05 DIAGNOSIS — G4730 Sleep apnea, unspecified: Secondary | ICD-10-CM | POA: Insufficient documentation

## 2015-10-05 DIAGNOSIS — Z8673 Personal history of transient ischemic attack (TIA), and cerebral infarction without residual deficits: Secondary | ICD-10-CM | POA: Diagnosis not present

## 2015-10-05 DIAGNOSIS — Z8249 Family history of ischemic heart disease and other diseases of the circulatory system: Secondary | ICD-10-CM | POA: Insufficient documentation

## 2015-10-05 DIAGNOSIS — I340 Nonrheumatic mitral (valve) insufficiency: Secondary | ICD-10-CM | POA: Insufficient documentation

## 2015-10-05 DIAGNOSIS — Z95810 Presence of automatic (implantable) cardiac defibrillator: Secondary | ICD-10-CM | POA: Diagnosis present

## 2015-10-05 DIAGNOSIS — Z87891 Personal history of nicotine dependence: Secondary | ICD-10-CM | POA: Diagnosis not present

## 2015-10-05 DIAGNOSIS — M1990 Unspecified osteoarthritis, unspecified site: Secondary | ICD-10-CM | POA: Insufficient documentation

## 2015-10-05 DIAGNOSIS — I255 Ischemic cardiomyopathy: Secondary | ICD-10-CM

## 2015-10-05 DIAGNOSIS — I480 Paroxysmal atrial fibrillation: Secondary | ICD-10-CM | POA: Insufficient documentation

## 2015-10-05 DIAGNOSIS — E1122 Type 2 diabetes mellitus with diabetic chronic kidney disease: Secondary | ICD-10-CM | POA: Diagnosis not present

## 2015-10-05 DIAGNOSIS — Z951 Presence of aortocoronary bypass graft: Secondary | ICD-10-CM | POA: Diagnosis not present

## 2015-10-05 DIAGNOSIS — I10 Essential (primary) hypertension: Secondary | ICD-10-CM

## 2015-10-05 DIAGNOSIS — N189 Chronic kidney disease, unspecified: Secondary | ICD-10-CM | POA: Diagnosis not present

## 2015-10-05 DIAGNOSIS — I130 Hypertensive heart and chronic kidney disease with heart failure and stage 1 through stage 4 chronic kidney disease, or unspecified chronic kidney disease: Secondary | ICD-10-CM | POA: Insufficient documentation

## 2015-10-05 DIAGNOSIS — Z01812 Encounter for preprocedural laboratory examination: Secondary | ICD-10-CM

## 2015-10-05 DIAGNOSIS — Z7982 Long term (current) use of aspirin: Secondary | ICD-10-CM | POA: Insufficient documentation

## 2015-10-05 DIAGNOSIS — I2510 Atherosclerotic heart disease of native coronary artery without angina pectoris: Secondary | ICD-10-CM | POA: Insufficient documentation

## 2015-10-05 DIAGNOSIS — Z4502 Encounter for adjustment and management of automatic implantable cardiac defibrillator: Secondary | ICD-10-CM | POA: Diagnosis not present

## 2015-10-05 DIAGNOSIS — I6523 Occlusion and stenosis of bilateral carotid arteries: Secondary | ICD-10-CM | POA: Diagnosis not present

## 2015-10-05 DIAGNOSIS — I5022 Chronic systolic (congestive) heart failure: Secondary | ICD-10-CM | POA: Insufficient documentation

## 2015-10-05 DIAGNOSIS — Z7902 Long term (current) use of antithrombotics/antiplatelets: Secondary | ICD-10-CM | POA: Insufficient documentation

## 2015-10-05 DIAGNOSIS — D649 Anemia, unspecified: Secondary | ICD-10-CM | POA: Diagnosis not present

## 2015-10-05 DIAGNOSIS — I252 Old myocardial infarction: Secondary | ICD-10-CM | POA: Diagnosis not present

## 2015-10-05 HISTORY — PX: EP IMPLANTABLE DEVICE: SHX172B

## 2015-10-05 LAB — SURGICAL PCR SCREEN
MRSA, PCR: NEGATIVE
STAPHYLOCOCCUS AUREUS: NEGATIVE

## 2015-10-05 LAB — GLUCOSE, CAPILLARY
GLUCOSE-CAPILLARY: 157 mg/dL — AB (ref 65–99)
GLUCOSE-CAPILLARY: 159 mg/dL — AB (ref 65–99)

## 2015-10-05 SURGERY — ICD/BIV ICD GENERATOR CHANGEOUT
Anesthesia: LOCAL

## 2015-10-05 MED ORDER — ACETAMINOPHEN 325 MG PO TABS
325.0000 mg | ORAL_TABLET | ORAL | Status: DC | PRN
  Filled 2015-10-05: qty 2

## 2015-10-05 MED ORDER — SODIUM CHLORIDE 0.9 % IR SOLN
Status: AC
  Filled 2015-10-05: qty 2

## 2015-10-05 MED ORDER — CEFAZOLIN SODIUM-DEXTROSE 2-3 GM-% IV SOLR
INTRAVENOUS | Status: AC
  Filled 2015-10-05: qty 50

## 2015-10-05 MED ORDER — FENTANYL CITRATE (PF) 100 MCG/2ML IJ SOLN
INTRAMUSCULAR | Status: DC | PRN
  Administered 2015-10-05 (×2): 25 ug via INTRAVENOUS
  Administered 2015-10-05 (×2): 12.5 ug via INTRAVENOUS

## 2015-10-05 MED ORDER — LIDOCAINE HCL (PF) 1 % IJ SOLN
INTRAMUSCULAR | Status: DC | PRN
  Administered 2015-10-05: 60 mL

## 2015-10-05 MED ORDER — LIDOCAINE HCL (PF) 1 % IJ SOLN
INTRAMUSCULAR | Status: AC
  Filled 2015-10-05: qty 60

## 2015-10-05 MED ORDER — FENTANYL CITRATE (PF) 100 MCG/2ML IJ SOLN
INTRAMUSCULAR | Status: AC
  Filled 2015-10-05: qty 2

## 2015-10-05 MED ORDER — SODIUM CHLORIDE 0.9 % IR SOLN
80.0000 mg | Status: AC
  Administered 2015-10-05: 80 mg

## 2015-10-05 MED ORDER — MIDAZOLAM HCL 5 MG/5ML IJ SOLN
INTRAMUSCULAR | Status: AC
  Filled 2015-10-05: qty 5

## 2015-10-05 MED ORDER — MIDAZOLAM HCL 5 MG/5ML IJ SOLN
INTRAMUSCULAR | Status: DC | PRN
  Administered 2015-10-05 (×2): 2 mg via INTRAVENOUS
  Administered 2015-10-05 (×4): 1 mg via INTRAVENOUS

## 2015-10-05 MED ORDER — MUPIROCIN 2 % EX OINT
TOPICAL_OINTMENT | CUTANEOUS | Status: AC
  Administered 2015-10-05: 1 via TOPICAL
  Filled 2015-10-05: qty 22

## 2015-10-05 MED ORDER — MUPIROCIN 2 % EX OINT
1.0000 "application " | TOPICAL_OINTMENT | Freq: Once | CUTANEOUS | Status: AC
  Administered 2015-10-05: 1 via TOPICAL

## 2015-10-05 MED ORDER — CEFAZOLIN SODIUM-DEXTROSE 2-3 GM-% IV SOLR
2.0000 g | INTRAVENOUS | Status: AC
  Administered 2015-10-05: 2 g via INTRAVENOUS

## 2015-10-05 MED ORDER — LIDOCAINE HCL (PF) 1 % IJ SOLN
INTRAMUSCULAR | Status: AC
  Filled 2015-10-05: qty 30

## 2015-10-05 MED ORDER — HEPARIN (PORCINE) IN NACL 2-0.9 UNIT/ML-% IJ SOLN
INTRAMUSCULAR | Status: AC
  Filled 2015-10-05: qty 500

## 2015-10-05 MED ORDER — SODIUM CHLORIDE 0.9 % IV SOLN
INTRAVENOUS | Status: DC
  Administered 2015-10-05: 07:00:00 via INTRAVENOUS

## 2015-10-05 MED ORDER — ONDANSETRON HCL 4 MG/2ML IJ SOLN: 4.0000 mg | Freq: Four times a day (QID) | INTRAMUSCULAR | Status: DC | PRN

## 2015-10-05 MED ORDER — CHLORHEXIDINE GLUCONATE 4 % EX LIQD: 60.0000 mL | Freq: Once | CUTANEOUS | Status: DC

## 2015-10-05 SURGICAL SUPPLY — 4 items
CABLE SURGICAL S-101-97-12 (CABLE) ×1 IMPLANT
ICD ELLIPSE DR CD2411-36C (ICD Generator) ×1 IMPLANT
PAD DEFIB LIFELINK (PAD) ×1 IMPLANT
TRAY PACEMAKER INSERTION (PACKS) ×1 IMPLANT

## 2015-10-05 NOTE — Discharge Instructions (Signed)
Pacemaker Battery Change °A pacemaker battery usually lasts 4 to 12 years. Once or twice per year, you will be asked to visit your health care provider to have a full evaluation of your pacemaker. When a battery needs to be replaced, the entire pacemaker is replaced so that you can benefit from new circuitry and any new features that have been added to pacemakers. Most often, this procedure is very simple because the leads are already in place.  °There are many things that affect how long a pacemaker battery will last, including:  °· The age of the pacemaker.   °· The number of leads (1, 2, or 3).   °· The pacemaker work load. If the pacemaker is helping the heart more often, the battery will not last as long as it would if the pacemaker did not need to help the heart.   °· Power (voltage) settings.  °LET YOUR HEALTH CARE PROVIDER KNOW ABOUT:  °· Any allergies you have.   °· All medicines you are taking, including vitamins, herbs, eye drops, creams, and over-the-counter medicines.   °· Previous problems you or members of your family have had with the use of anesthetics.   °· Any blood disorders you have.   °· Previous surgeries you have had, especially since your last pacemaker placement.   °· Medical conditions you have.   °· Possibility of pregnancy, if this applies. °· Symptoms of chest pain, trouble breathing, palpitations, light-headedness, or feelings of an abnormal or irregular heartbeat. °RISKS AND COMPLICATIONS  °Generally, this is a safe procedure. However, as with any procedure, problems can occur and include:  °· Bleeding.   °· Bruising of the skin around where the incision was made.   °· Pain at the incision site.   °· Pulling apart of the skin at the incision site.   °· Infection.   °· Allergic reaction to anesthetics or other medicines used during the procedure.   °People with diabetes may have a temporary increase in their blood sugar after any surgical procedure.  °BEFORE THE PROCEDURE  °· Wash all  of the skin around the area of the chest where the pacemaker is located.   °· Ask your health care provider for help with any medicine adjustments before the pacemaker is replaced.   °· Do not eat or drink anything after midnight on the night before the procedure or as directed by your health care provider. °· Ask your health care provider if you can take a sip of water with any approved medicines the morning of the procedure. °PROCEDURE  °· After giving medicine to numb the skin (local anesthetic), your health care provider will make a cut to reopen the pocket holding the pacemaker.   °· The old pacemaker will be disconnected from its leads.   °· The leads will be tested.   °· If needed, the leads will be replaced. If the leads are functioning properly, the new pacemaker may be connected to the existing leads. °· A heart monitor and the pacemaker programmer will be used to make sure that the new pacemaker is working properly. °· The incision site will then be closed. A dressing will be placed over the pacemaker site. The dressing will be removed 24-48 hours afterward. °AFTER THE PROCEDURE  °· You will be taken to a recovery area after the new pacemaker implant is completed. Your vital signs such as blood pressure, heart rate, breathing, and oxygen levels will be monitored. °· Your health care provider will tell you when you will need to next test your pacemaker or when to return to the office for follow-up   for removal of stitches. °  °This information is not intended to replace advice given to you by your health care provider. Make sure you discuss any questions you have with your health care provider. °  °Document Released: 12/24/2006 Document Revised: 10/06/2014 Document Reviewed: 03/30/2013 °Elsevier Interactive Patient Education ©2016 Elsevier Inc. ° °

## 2015-10-05 NOTE — H&P (View-Only) (Signed)
HPI [name] returns today for followup. He is a very pleasant 70 year old man with an ischemic cardiomyopathy, chronic systolic heart failure, status post ICD implantation. In the interim, he has been stable. He denies chest pain or shortness of breath. He has mild peripheral edema.  He denies syncope or near-syncope. No chest pain. He has reached ERI on his ICD. He has had no ICD shock. He reports that his blood sugar has been much improved. Allergies  Allergen Reactions  . Codeine Nausea And Vomiting     Current Outpatient Prescriptions  Medication Sig Dispense Refill  . aspirin EC 81 MG EC tablet Take 1 tablet (81 mg total) by mouth daily.    [name] atorvastatin (LIPITOR) 80 MG tablet Take 0.5 tablets (40 mg total) by mouth every other day. 90 tablet 3  . baclofen (LIORESAL) 20 MG tablet Take 40 mg by mouth at bedtime.     [name] BISACODYL PO Take 1 tablet by mouth daily as needed (constipation).     . calcium carbonate (TUMS - DOSED IN MG ELEMENTAL CALCIUM) 500 MG chewable tablet Chew 1 tablet by mouth 3 (three) times daily as needed for indigestion or heartburn.     . Canagliflozin (INVOKANA) 300 MG TABS Take 1 tablet by mouth daily.    . carvedilol (COREG) 12.5 MG tablet Take 1 tablet (12.5 mg total) by mouth 2 (two) times daily with a meal. 180 tablet 3  . clopidogrel (PLAVIX) 75 MG tablet Take 1 tablet (75 mg total) by mouth daily. 90 tablet 3  . furosemide (LASIX) 40 MG tablet Take 1 tablet (40 mg total) by mouth daily as needed. (Patient taking differently: Take 40 mg by mouth daily as needed for fluid or edema. ) 30 tablet 6  . glimepiride (AMARYL) 4 MG tablet Take 4 mg by mouth 2 (two) times daily.     [name] linagliptin (TRADJENTA) 5 MG TABS tablet Take 5 mg by mouth daily after breakfast.     . meclizine (ANTIVERT) 25 MG tablet Take 25 mg by mouth 4 (four) times daily as needed. For dizziness    . metoCLOPramide (REGLAN) 10 MG tablet Take 10 mg by mouth daily.    . nitroGLYCERIN (NITROSTAT)  0.4 MG SL tablet Place 1 tablet (0.4 mg total) under the tongue every 5 (five) minutes as needed for chest pain. 25 tablet 4  . oxyCODONE-acetaminophen (PERCOCET) 10-325 MG per tablet Take 1 tablet by mouth every 6 (six) hours.    [name] RANITIDINE HCL PO Take 1 tablet by mouth daily as needed (heartburn or indigestion).     . SENNA CO Take 1 tablet by mouth daily as needed (constipation).     [name] spironolactone (ALDACTONE) 25 MG tablet Take 1 tablet (25 mg total) by mouth every morning. 90 tablet 3   No current facility-administered medications for this visit.     Past Medical History  Diagnosis Date  . Sinus bradycardia   . Tobacco abuse   . Chronic renal insufficiency   . Osteoarthritis   . Diabetes mellitus   . Mitral regurgitation   . Systolic CHF, chronic (HCC)     Previously taken off lisinopril by primary doctor due to labs  . Carotid stenosis     a. Prior carotid dz/ surgery. b. Carotid dopp 07/2011 - 0-39% bilaterally.;   c. Doppler 11/13: 40-59% RICA, 0-39% LICA  . Cardiomyopathy, ischemic     a. Chronic systolic CHF s/p St. Jude dual chamber ICD 03/2008.  [name]  Hx of CABG   . CAD (coronary artery disease)     a. STEMI s/p CABG 07/2007 (had preop cardiogenic shock, IABP, VDRF before surgery)  . Stroke [facility])     a. CVA 1990s - chronic pain in RUE after stroke.  [name] PAF (paroxysmal atrial fibrillation) (HCC)     a. Poor Coumadin candidate due to hx of GI bleed.  . GI bleed     Reported history  . Splenomegaly   . Myocardial infarction (HCC)   . Automatic implantable cardioverter-defibrillator in situ   . Hypertension     [place] [facility]   . Sleep apnea     study done, 10 yrs ago, unable to tolerate CPAP  . Neuromuscular disorder (HCC)     R sided weakness   . Anemia     Multifactorial  - hx of B12 def and iron deficiency, [facility] heme[facility]    ROS:   All systems reviewed and negative except as noted in the HPI.   Past Surgical History   Procedure Laterality Date  . Coronary artery bypass graft    . Percutaneous coronary intervention  01/10/2005    Charlies Constable, MD  . Coronary artery bypass graft  08/29/2007    x3; Salvatore Decent. Cornelius Moras MD  . Cardiac defibrillator placement  04/05/2008    Implantation of a St. Jude dual-chamber defibrillator, Doylene Canning. Ladona Ridgel , MD  . Cardiac defibrillator placement    . Tonsillectomy    . Joint replacement  1990's    L knee  . Total knee arthroplasty Right 08/16/2013    Procedure: RIGHT TOTAL KNEE ARTHROPLASTY;  Surgeon: Kathryne Hitch, MD;  Location: [facility] OR;  Service: Orthopedics;  Laterality: Right;     Family History  Problem Relation Age of Onset  . Heart attack Mother     CVA, MI  . Diabetes Mother   . Heart attack Father     MI  . Diabetes Sister   . Coronary artery disease Brother     CABG     Social History   Social History  . Marital Status: Married    Spouse Name: N/A  . Number of Children: N/A  . Years of Education: N/A   Occupational History  . Retired    Social History Main Topics  . Smoking status: Former Smoker -- 1.00 packs/day for 15 years    Quit date: 09/29/1990  . Smokeless tobacco: Former Neurosurgeon    Quit date: 08/09/1988  . Alcohol Use: No  . Drug Use: No  . Sexual Activity: Not on file   Other Topics Concern  . Not on file   Social History Narrative   Lives in [place] with wife   Been on disability since his stroke in the 1990s   Not routinely exercising     BP 140/76 mmHg  Pulse 60  Ht  (1.854 m)  Wt 222 lb (100.699 kg)  BMI 29.30 kg/m2  Physical Exam:  stable appearing 70 year old man, NAD HEENT: Unremarkable Neck:  7 cm JVD, no thyromegally Back:  No CVA tenderness Lungs:  Clear with no wheezes, rales, or rhonchi. HEART:  Regular rate rhythm, no murmurs, no rubs, no clicks Abd:  soft, positive bowel sounds, no organomegally, no rebound, no guarding Ext:  2 plus pulses, trace peripheral edema, no cyanosis, no  clubbing Skin:  No rashes no nodules Neuro:  CN II through XII intact, motor grossly intact  DEVICE  Normal  device function.  See PaceArt for details. Device at [facility]  Assess/Plan:

## 2015-10-05 NOTE — Interval H&P Note (Signed)
History and Physical Interval Note:  10/05/2015 7:36 AM  [name]  has presented today for surgery, with the diagnosis of eri  The various methods of treatment have been discussed with the patient and family. After consideration of risks, benefits and other options for treatment, the patient has consented to  Procedure(s): ICD [employer] (N/A) as a surgical intervention .  The patient's history has been reviewed, patient examined, no change in status, stable for surgery.  I have reviewed the patient's chart and labs.  Questions were answered to the patient's satisfaction.     Lewayne Bunting

## 2015-10-05 NOTE — H&P (Signed)
  ICD Criteria  Current LVEF:25%. Within 12 months prior to implant: Yes   Heart failure history: Yes, Class II  Cardiomyopathy history: Yes, Ischemic Cardiomyopathy.  Atrial Fibrillation/Atrial Flutter: Yes, Paroxysmal.  Ventricular tachycardia history: [name]  Cardiac arrest history: [name]  History of syndromes with risk of sudden death: [name]  Previous ICD: Yes, Reason for ICD:  Primary prevention.  Current ICD indication: Primary  PPM indication: [name]   Class I or II Bradycardia indication present: No  Beta Blocker therapy for 3 or more months: Yes, prescribed.   Ace Inhibitor/ARB therapy for 3 or more months: No, medical reason.

## 2015-10-09 ENCOUNTER — Telehealth: Payer: Self-pay | Admitting: *Deleted

## 2015-10-09 ENCOUNTER — Ambulatory Visit: Payer: Medicare HMO

## 2015-10-09 MED FILL — Heparin Sodium (Porcine) 2 Unit/ML in Sodium Chloride 0.9%: INTRAMUSCULAR | Qty: 500 | Status: AC

## 2015-10-09 MED FILL — Sodium Chloride Irrigation Soln 0.9%: Qty: 500 | Status: AC

## 2015-10-09 MED FILL — Gentamicin Sulfate Inj 40 MG/ML: INTRAMUSCULAR | Qty: 2 | Status: AC

## 2015-10-09 NOTE — Telephone Encounter (Signed)
Called patient about missed appointment.  He states that he couldn't get out for appointment and he also had a procedure done last week.  Had ICD changed out.  Feeling good and wanted to reschedule injection to tomorrow.  New date and time given to patient.

## 2015-10-10 ENCOUNTER — Ambulatory Visit (HOSPITAL_BASED_OUTPATIENT_CLINIC_OR_DEPARTMENT_OTHER): Payer: Medicare HMO

## 2015-10-10 VITALS — BP 142/68 | HR 59 | Temp 97.6°F

## 2015-10-10 DIAGNOSIS — D509 Iron deficiency anemia, unspecified: Secondary | ICD-10-CM

## 2015-10-10 DIAGNOSIS — E538 Deficiency of other specified B group vitamins: Secondary | ICD-10-CM

## 2015-10-10 MED ORDER — CYANOCOBALAMIN 1000 MCG/ML IJ SOLN
1000.0000 ug | Freq: Once | INTRAMUSCULAR | Status: AC
  Administered 2015-10-10: 1000 ug via INTRAMUSCULAR

## 2015-10-15 ENCOUNTER — Ambulatory Visit: Payer: Medicare HMO

## 2015-10-16 ENCOUNTER — Encounter: Payer: Self-pay | Admitting: Cardiology

## 2015-10-22 ENCOUNTER — Ambulatory Visit (INDEPENDENT_AMBULATORY_CARE_PROVIDER_SITE_OTHER): Payer: Medicare HMO | Admitting: *Deleted

## 2015-10-22 ENCOUNTER — Encounter: Payer: Self-pay | Admitting: Internal Medicine

## 2015-10-22 DIAGNOSIS — I255 Ischemic cardiomyopathy: Secondary | ICD-10-CM

## 2015-10-22 LAB — CUP PACEART INCLINIC DEVICE CHECK
Battery Remaining Longevity: 92.4
HighPow Impedance: 60.75 Ohm
Implantable Lead Implant Date: 20090708
Implantable Lead Location: 753859
Implantable Lead Model: 7122
Lead Channel Impedance Value: 350 Ohm
Lead Channel Pacing Threshold Amplitude: 0.5 V
Lead Channel Pacing Threshold Amplitude: 1.25 V
Lead Channel Pacing Threshold Pulse Width: 0.5 ms
Lead Channel Pacing Threshold Pulse Width: 0.9 ms
Lead Channel Sensing Intrinsic Amplitude: 2.3 mV
Lead Channel Setting Pacing Amplitude: 2.5 V
Lead Channel Setting Sensing Sensitivity: 0.5 mV
MDC IDC LEAD IMPLANT DT: 20090708
MDC IDC LEAD LOCATION: 753860
MDC IDC MSMT LEADCHNL RA PACING THRESHOLD AMPLITUDE: 0.5 V
MDC IDC MSMT LEADCHNL RA PACING THRESHOLD PULSEWIDTH: 0.5 ms
MDC IDC MSMT LEADCHNL RV IMPEDANCE VALUE: 350 Ohm
MDC IDC MSMT LEADCHNL RV PACING THRESHOLD AMPLITUDE: 1.25 V
MDC IDC MSMT LEADCHNL RV PACING THRESHOLD PULSEWIDTH: 0.9 ms
MDC IDC MSMT LEADCHNL RV SENSING INTR AMPL: 12 mV
MDC IDC PG SERIAL: 7306179
MDC IDC SESS DTM: 20170123135536
MDC IDC SET LEADCHNL RA PACING AMPLITUDE: 2 V
MDC IDC SET LEADCHNL RV PACING PULSEWIDTH: 0.9 ms
MDC IDC STAT BRADY RA PERCENT PACED: 73 %
MDC IDC STAT BRADY RV PERCENT PACED: 0.35 %

## 2015-10-22 NOTE — Progress Notes (Signed)
Wound check appointment. Steri-strips removed. Wound without redness or edema. Incision edges approximated, wound well healed. Normal device function. Thresholds, sensing, and impedances consistent with implant measurements. Device programmed at appropriate safety margins---RV pulse width increased to 0.59ms. Histogram distribution appropriate for patient and level of activity. No mode switches or ventricular arrhythmias noted. Patient educated about wound care, arm mobility, lifting restrictions, shock plan. ROV in 3 months with GT.

## 2015-11-05 ENCOUNTER — Other Ambulatory Visit: Payer: Self-pay | Admitting: Internal Medicine

## 2015-11-06 ENCOUNTER — Ambulatory Visit (HOSPITAL_BASED_OUTPATIENT_CLINIC_OR_DEPARTMENT_OTHER): Payer: Medicare HMO

## 2015-11-06 VITALS — BP 125/60 | HR 61 | Temp 98.1°F

## 2015-11-06 DIAGNOSIS — E538 Deficiency of other specified B group vitamins: Secondary | ICD-10-CM

## 2015-11-06 DIAGNOSIS — D509 Iron deficiency anemia, unspecified: Secondary | ICD-10-CM

## 2015-11-06 MED ORDER — CYANOCOBALAMIN 1000 MCG/ML IJ SOLN
1000.0000 ug | Freq: Once | INTRAMUSCULAR | Status: AC
  Administered 2015-11-06: 1000 ug via INTRAMUSCULAR

## 2015-12-04 ENCOUNTER — Ambulatory Visit (HOSPITAL_BASED_OUTPATIENT_CLINIC_OR_DEPARTMENT_OTHER): Payer: Medicare HMO

## 2015-12-04 VITALS — BP 126/63 | HR 60 | Temp 98.0°F

## 2015-12-04 DIAGNOSIS — E538 Deficiency of other specified B group vitamins: Secondary | ICD-10-CM

## 2015-12-04 DIAGNOSIS — D509 Iron deficiency anemia, unspecified: Secondary | ICD-10-CM

## 2015-12-04 MED ORDER — CYANOCOBALAMIN 1000 MCG/ML IJ SOLN
1000.0000 ug | Freq: Once | INTRAMUSCULAR | Status: AC
  Administered 2015-12-04: 1000 ug via INTRAMUSCULAR

## 2016-01-01 ENCOUNTER — Ambulatory Visit (HOSPITAL_BASED_OUTPATIENT_CLINIC_OR_DEPARTMENT_OTHER): Payer: Medicare HMO | Admitting: Oncology

## 2016-01-01 ENCOUNTER — Ambulatory Visit (HOSPITAL_BASED_OUTPATIENT_CLINIC_OR_DEPARTMENT_OTHER): Payer: Medicare HMO

## 2016-01-01 ENCOUNTER — Telehealth: Payer: Self-pay | Admitting: Oncology

## 2016-01-01 ENCOUNTER — Other Ambulatory Visit (HOSPITAL_BASED_OUTPATIENT_CLINIC_OR_DEPARTMENT_OTHER): Payer: Medicare HMO

## 2016-01-01 VITALS — BP 137/66 | HR 64 | Temp 97.6°F | Resp 19 | Wt 211.5 lb

## 2016-01-01 DIAGNOSIS — D509 Iron deficiency anemia, unspecified: Secondary | ICD-10-CM

## 2016-01-01 DIAGNOSIS — E538 Deficiency of other specified B group vitamins: Secondary | ICD-10-CM

## 2016-01-01 DIAGNOSIS — D649 Anemia, unspecified: Secondary | ICD-10-CM

## 2016-01-01 LAB — CBC WITH DIFFERENTIAL/PLATELET
BASO%: 0.2 % (ref 0.0–2.0)
Basophils Absolute: 0 10*3/uL (ref 0.0–0.1)
EOS ABS: 0.2 10*3/uL (ref 0.0–0.5)
EOS%: 3 % (ref 0.0–7.0)
HCT: 38.6 % (ref 38.4–49.9)
HGB: 12.5 g/dL — ABNORMAL LOW (ref 13.0–17.1)
LYMPH%: 23.3 % (ref 14.0–49.0)
MCH: 26.9 pg — ABNORMAL LOW (ref 27.2–33.4)
MCHC: 32.4 g/dL (ref 32.0–36.0)
MCV: 83.2 fL (ref 79.3–98.0)
MONO#: 0.8 10*3/uL (ref 0.1–0.9)
MONO%: 12 % (ref 0.0–14.0)
NEUT#: 4 10*3/uL (ref 1.5–6.5)
NEUT%: 61.5 % (ref 39.0–75.0)
PLATELETS: 178 10*3/uL (ref 140–400)
RBC: 4.64 10*6/uL (ref 4.20–5.82)
RDW: 15.6 % — ABNORMAL HIGH (ref 11.0–14.6)
WBC: 6.6 10*3/uL (ref 4.0–10.3)
lymph#: 1.5 10*3/uL (ref 0.9–3.3)

## 2016-01-01 LAB — COMPREHENSIVE METABOLIC PANEL
ALT: 9 U/L (ref 0–55)
ANION GAP: 9 meq/L (ref 3–11)
AST: 13 U/L (ref 5–34)
Albumin: 3.7 g/dL (ref 3.5–5.0)
Alkaline Phosphatase: 70 U/L (ref 40–150)
BUN: 19.9 mg/dL (ref 7.0–26.0)
CHLORIDE: 106 meq/L (ref 98–109)
CO2: 28 meq/L (ref 22–29)
Calcium: 9 mg/dL (ref 8.4–10.4)
Creatinine: 1.3 mg/dL (ref 0.7–1.3)
EGFR: 59 mL/min/{1.73_m2} — AB (ref >90)
Glucose: 148 mg/dl — ABNORMAL HIGH (ref 70–140)
Potassium: 4.2 mEq/L (ref 3.5–5.1)
Sodium: 143 mEq/L (ref 136–145)
Total Bilirubin: 0.52 mg/dL (ref 0.20–1.20)
Total Protein: 6.6 g/dL (ref 6.4–8.3)

## 2016-01-01 LAB — FERRITIN: FERRITIN: 19 ng/mL — AB (ref 22–316)

## 2016-01-01 LAB — IRON AND TIBC
%SAT: 9 % — AB (ref 20–55)
Iron: 35 ug/dL — ABNORMAL LOW (ref 42–163)
TIBC: 373 ug/dL (ref 202–409)
UIBC: 338 ug/dL (ref 117–376)

## 2016-01-01 MED ORDER — CYANOCOBALAMIN 1000 MCG/ML IJ SOLN
1000.0000 ug | Freq: Once | INTRAMUSCULAR | Status: AC
  Administered 2016-01-01: 1000 ug via INTRAMUSCULAR

## 2016-01-01 NOTE — Progress Notes (Signed)
[name]EMATOLOGY OFFICE PROGRESS NOTE 01/01/2016   [name], [name] [address] [name] [address]  DIAGNOSIS: 70 year old gentleman with multifactorial anemia. He has an element of iron deficiency, B12 and anemia of chronic disease. This was diagnosed in 2008.    CURRENT THERAPY: Monthly Vitamin B-12 1000 mcg IM injection. These injections were restarted on 08/05/2012.  INTERVAL HISTORY:  [name] presents today for a follow-up visit by himself. Since the last visit, he reports no changes in his health. He continues to receive vitamin B12 injections without complications. He reports these injections [name] help him to maintain a reasonable energy and performance status. He has not had any hospitalizations or illnesses. He still able to drive and attends to activities of daily living. He has not reported any bleeding such as hematochezia or melena. His appetite remains reasonable without any progressive weight loss. He does not take any oral iron at this time because of poor tolerance. He reported dyspepsia and constipation associated with it.  He does not report any headaches, blurry vision, syncope or seizures. He does not report any fevers, chills, sweats or weight loss. He is not report any chest pain, palpitation, orthopnea. He does not report any cough, wheezing or hemoptysis. He does not report any nausea, vomiting, abdominal pain. He does not report any musca skeletal complaints. He does not report any frequency urgency or hesitancy. Remainder review of systems unremarkable.   ALLERGIES:  is allergic to codeine.  MEDICATIONS:   Current Outpatient Prescriptions  Medication Sig Dispense Refill  . aspirin EC 81 MG EC tablet Take 1 tablet (81 mg total) by mouth daily.    [name] atorvastatin (LIPITOR) 80 MG tablet Take 0.5 tablets (40 mg total) by mouth every other day. 90 tablet 3  . baclofen (LIORESAL) 20 MG tablet Take 40 mg by mouth at bedtime.     [name] BISACODYL  PO Take 1 tablet by mouth daily as needed (constipation).     . calcium carbonate (TUMS - DOSED IN MG ELEMENTAL CALCIUM) 500 MG chewable tablet Chew 1 tablet by mouth 3 (three) times daily as needed for indigestion or heartburn.     . Canagliflozin (INVOKANA) 300 MG TABS Take 1 tablet by mouth daily.    . carvedilol (COREG) 12.5 MG tablet Take 1 tablet (12.5 mg total) by mouth 2 (two) times daily with a meal. 180 tablet 3  . clopidogrel (PLAVIX) 75 MG tablet TAKE 1 TABLET EVERY DAY 90 tablet 0  . furosemide (LASIX) 40 MG tablet Take 1 tablet (40 mg total) by mouth daily as needed. (Patient taking differently: Take 40 mg by mouth daily as needed for fluid or edema. ) 30 tablet 6  . glimepiride (AMARYL) 4 MG tablet Take 4 mg by mouth 2 (two) times daily.     [name] linagliptin (TRADJENTA) 5 MG TABS tablet Take 5 mg by mouth daily after breakfast.     . meclizine (ANTIVERT) 25 MG tablet Take 25 mg by mouth 4 (four) times daily as needed. For dizziness    . metoCLOPramide (REGLAN) 10 MG tablet Take 10 mg by mouth daily as needed for nausea or vomiting.     . nitroGLYCERIN (NITROSTAT) 0.4 MG SL tablet Place 1 tablet (0.4 mg total) under the tongue every 5 (five) minutes as needed for chest pain. 25 tablet 4  . oxyCODONE-acetaminophen (PERCOCET) 10-325 MG per tablet Take 1 tablet by mouth every 6 (six) hours.    [name] RANITIDINE HCL  PO Take 1 tablet by mouth daily as needed (heartburn or indigestion).     . SENNA CO Take 1 tablet by mouth daily as needed (constipation).     [name] spironolactone (ALDACTONE) 25 MG tablet Take 1 tablet (25 mg total) by mouth every morning. 90 tablet 3  . triamcinolone cream (KENALOG) 0.5 % Apply 1 application topically daily as needed (for leg lesion).     No current facility-administered medications for this visit.   Facility-Administered Medications Ordered in Other Visits  Medication Dose Route Frequency Provider Last Rate Last Dose  . cyanocobalamin ((VITAMIN B-12)) injection  1,000 mcg  1,000 mcg Intramuscular Once Benjiman Core, MD         PHYSICAL EXAMINATION: ECOG PERFORMANCE STATUS: 1  Blood pressure 137/66, pulse 64, temperature 97.6 F (36.4 C), temperature source Oral, resp. rate 19, weight 211 lb 8 oz (95.936 kg), SpO2 100 %.  GENERAL:alert, awake appeared comfortable. SKIN:, no rashes or significant lesions EYES: Conjunctiva are pink and non-injected.  OROPHARYNX: No oral thrush noted. No oral ulcers or lesions. NECK: Scars well healed on right neck area.  No thyromegaly. LYMPH:  no palpable lymphadenopathy in the cervical, axillary or supraclavicular LUNGS: clear to auscultation and percussion with normal breathing effort no dullness to percussion. HEART: regular rate & rhythm and no murmurs and no lower extremity edema;+ Implanted ICD on the right infraclavicular area.   ABDOMEN:abdomen soft, non-tender and normal bowel sounds. No shifting dullness or ascites. Musculoskeletal:no cyanosis of digits and no clubbing  NEURO: Right-sided weakness noted. Ambulation was normal.    Labs:  Lab Results  Component Value Date   WBC 6.6 01/01/2016   HGB 12.5* 01/01/2016   HCT 38.6 01/01/2016   MCV 83.2 01/01/2016   PLT 178 01/01/2016   NEUTROABS 4.0 01/01/2016      Chemistry      Component Value Date/Time   NA 138 09/25/2015 0955   NA 142 01/02/2015 0831   K 3.8 09/25/2015 0955   K 4.2 01/02/2015 0831   CL 101 09/25/2015 0955   CL 100 01/06/2013 0830   CO2 28 09/25/2015 0955   CO2 25 01/02/2015 0831   BUN 19 09/25/2015 0955   BUN 17.9 01/02/2015 0831   CREATININE 1.07 09/25/2015 0955   CREATININE 1.1 01/02/2015 0831   CREATININE 0.98 11/18/2013 1143      Component Value Date/Time   CALCIUM 9.2 09/25/2015 0955   CALCIUM 8.8 01/02/2015 0831   ALKPHOS 94 01/02/2015 0831   ALKPHOS 98 11/18/2013 1143   AST 15 01/02/2015 0831   AST 13 11/18/2013 1143   ALT 10 01/02/2015 0831   ALT 9 11/18/2013 1143   BILITOT 0.51 01/02/2015 0831    BILITOT 0.4 11/18/2013 1143       Assessment and plan:    70 year old gentleman with the following issues:   1. Anemia: B-12 deficiency is the predominant diagnosis. He continues to receive vitamin B12 injections on a monthly basis which has helped his hemoglobin reasonably well. His B12 levels remain maintained with is monthly injections. The plan is to continue with 1000 g on a monthly basis. He does have some microcytosis which could be contributed by iron deficiency as well. I will check his iron levels to document any deficiencies. If he does have iron deficiency, I have discussed with him the role of intravenous iron given his poor tolerance to oral iron. Risks and benefits of Feraheme infusion were reviewed and he will be open to receiving  intravenous iron. He hasn't received it in the past and it helped him. We will schedule that depending on his iron levels.  2. History of stroke: He continues to have residual deficits on the right which has not changed.  3. Follow-up: Will be in 6 months sooner if needed to.   [facility] MD 01/01/2016

## 2016-01-01 NOTE — Telephone Encounter (Signed)
Gave and printed appt shced and avs for pt for May thru OCT

## 2016-01-02 ENCOUNTER — Ambulatory Visit (INDEPENDENT_AMBULATORY_CARE_PROVIDER_SITE_OTHER): Payer: Medicare HMO | Admitting: Internal Medicine

## 2016-01-02 ENCOUNTER — Telehealth: Payer: Self-pay | Admitting: Oncology

## 2016-01-02 ENCOUNTER — Encounter: Payer: Self-pay | Admitting: Internal Medicine

## 2016-01-02 VITALS — BP 118/58 | HR 66 | Ht 73.0 in | Wt 211.6 lb

## 2016-01-02 DIAGNOSIS — Z951 Presence of aortocoronary bypass graft: Secondary | ICD-10-CM | POA: Diagnosis not present

## 2016-01-02 DIAGNOSIS — I255 Ischemic cardiomyopathy: Secondary | ICD-10-CM

## 2016-01-02 LAB — VITAMIN B12: VITAMIN B 12: 363 pg/mL (ref 211–946)

## 2016-01-02 MED ORDER — NITROGLYCERIN 0.4 MG SL SUBL: 0.4000 mg | SUBLINGUAL_TABLET | SUBLINGUAL | Status: DC | PRN

## 2016-01-02 NOTE — Addendum Note (Signed)
Addended by: Chana Bode on: 01/02/2016 05:04 PM   Modules accepted: Orders, Medications

## 2016-01-02 NOTE — Patient Instructions (Signed)
Medication Instructions:  Your physician recommends that you continue on your current medications as directed. Please refer to the Current Medication list given to you today.   Labwork: None ordered   Testing/Procedures: None ordered   Follow-Up: Your physician wants you to follow-up in: 12 months with Dr Taylor You will receive a reminder letter in the mail two months in advance. If you don't receive a letter, please call our office to schedule the follow-up appointment.   Remote monitoring is used to monitor your  ICD from home. This monitoring reduces the number of office visits required to check your device to one time per year. It allows us to keep an eye on the functioning of your device to ensure it is working properly. You are scheduled for a device check from home on 04/02/16. You may send your transmission at any time that day. If you have a wireless device, the transmission will be sent automatically. After your physician reviews your transmission, you will receive a postcard with your next transmission date.     Any Other Special Instructions Will Be Listed Below (If Applicable).     If you need a refill on your cardiac medications before your next appointment, please call your pharmacy.   

## 2016-01-02 NOTE — Progress Notes (Signed)
HPI [name] returns today for followup. He is a very pleasant 70 year old man with an ischemic cardiomyopathy, chronic systolic heart failure, status post ICD implantation. In the interim, he has been stable. He denies chest pain or shortness of breath. He has mild peripheral edema.  He denies syncope or near-syncope. No chest pain. He has had no ICD shock.  Allergies  Allergen Reactions  . Codeine Nausea And Vomiting     Current Outpatient Prescriptions  Medication Sig Dispense Refill  . aspirin EC 81 MG EC tablet Take 1 tablet (81 mg total) by mouth daily.    [name] atorvastatin (LIPITOR) 80 MG tablet Take 0.5 tablets (40 mg total) by mouth every other day. 90 tablet 3  . baclofen (LIORESAL) 20 MG tablet Take 40 mg by mouth at bedtime.     [name] BISACODYL PO Take 1 tablet by mouth daily as needed (constipation).     . calcium carbonate (TUMS - DOSED IN MG ELEMENTAL CALCIUM) 500 MG chewable tablet Chew 1 tablet by mouth 3 (three) times daily as needed for indigestion or heartburn.     . Canagliflozin (INVOKANA) 300 MG TABS Take 1 tablet by mouth daily.    . carvedilol (COREG) 12.5 MG tablet Take 1 tablet (12.5 mg total) by mouth 2 (two) times daily with a meal. 180 tablet 3  . clopidogrel (PLAVIX) 75 MG tablet TAKE 1 TABLET EVERY DAY 90 tablet 0  . furosemide (LASIX) 40 MG tablet Take 1 tablet (40 mg total) by mouth daily as needed. (Patient taking differently: Take 40 mg by mouth daily as needed for fluid or edema. ) 30 tablet 6  . glimepiride (AMARYL) 4 MG tablet Take 4 mg by mouth 2 (two) times daily.     [name] linagliptin (TRADJENTA) 5 MG TABS tablet Take 5 mg by mouth daily after breakfast.     . meclizine (ANTIVERT) 25 MG tablet Take 25 mg by mouth 4 (four) times daily as needed. For dizziness    . metoCLOPramide (REGLAN) 10 MG tablet Take 10 mg by mouth daily as needed for nausea or vomiting.     . nitroGLYCERIN (NITROSTAT) 0.4 MG SL tablet Place 1 tablet (0.4 mg total) under the tongue every 5  (five) minutes as needed for chest pain. 25 tablet 4  . oxyCODONE-acetaminophen (PERCOCET) 10-325 MG per tablet Take 1 tablet by mouth every 4 (four) hours as needed for pain.     [name] RANITIDINE HCL PO Take 1 tablet by mouth daily as needed (heartburn or indigestion).     . SENNA CO Take 1 tablet by mouth daily as needed (constipation).     [name] spironolactone (ALDACTONE) 25 MG tablet Take 1 tablet (25 mg total) by mouth every morning. 90 tablet 3  . triamcinolone cream (KENALOG) 0.5 % Apply 1 application topically daily as needed (for leg lesion).     No current facility-administered medications for this visit.     Past Medical History  Diagnosis Date  . Sinus bradycardia   . Tobacco abuse   . Chronic renal insufficiency   . Osteoarthritis   . Diabetes mellitus   . Mitral regurgitation   . Systolic CHF, chronic (HCC)     Previously taken off lisinopril by primary doctor due to labs  . Carotid stenosis     a. Prior carotid dz/ surgery. b. Carotid dopp 07/2011 - 0-39% bilaterally.;   c. Doppler 11/13: 40-59% RICA, 0-39% LICA  . Cardiomyopathy, ischemic     a. Chronic  systolic CHF s/p St. Jude dual chamber ICD 03/2008.  [name] Hx of CABG   . CAD (coronary artery disease)     a. STEMI s/p CABG 07/2007 (had preop cardiogenic shock, IABP, VDRF before surgery)  . Stroke [facility])     a. CVA 1990s - chronic pain in RUE after stroke.  [name] PAF (paroxysmal atrial fibrillation) (HCC)     a. Poor Coumadin candidate due to hx of GI bleed.  . GI bleed     Reported history  . Splenomegaly   . Myocardial infarction (HCC)   . Automatic implantable cardioverter-defibrillator in situ   . Hypertension     [facility]   . Sleep apnea     study done, 10 yrs ago, unable to tolerate CPAP  . Neuromuscular disorder (HCC)     R sided weakness   . Anemia     Multifactorial  - hx of B12 def and iron deficiency, [facility]    ROS:   All systems reviewed and negative  except as noted in the HPI.   Past Surgical History  Procedure Laterality Date  . Coronary artery bypass graft    . Percutaneous coronary intervention  01/10/2005    Charlies Constable, MD  . Coronary artery bypass graft  08/29/2007    x3; Salvatore Decent. Cornelius Moras MD  . Cardiac defibrillator placement  04/05/2008    Implantation of a St. Jude dual-chamber defibrillator, Doylene Canning. Ladona Ridgel , MD  . Cardiac defibrillator placement    . Tonsillectomy    . Joint replacement  1990's    L knee  . Total knee arthroplasty Right 08/16/2013    Procedure: RIGHT TOTAL KNEE ARTHROPLASTY;  Surgeon: Kathryne Hitch, MD;  [facility];  Service: Orthopedics;  Laterality: Right;  . Ep implantable device N/A 10/05/2015    Procedure: ICD Generator Changeout;  Surgeon: Marinus Maw, MD;  Location: [facility] INVASIVE CV LAB;  Service: Cardiovascular;  Laterality: N/A;     Family History  Problem Relation Age of Onset  . Heart attack Mother     CVA, MI  . Diabetes Mother   . Heart attack Father     MI  . Diabetes Sister   . Coronary artery disease Brother     CABG     Social History   Social History  . Marital Status: Married    Spouse Name: N/A  . Number of Children: N/A  . Years of Education: N/A   Occupational History  . Retired    Social History Main Topics  . Smoking status: Former Smoker -- 1.00 packs/day for 15 years    Quit date: 09/29/1990  . Smokeless tobacco: Former Neurosurgeon    Quit date: 08/09/1988  . Alcohol Use: No  . Drug Use: No  . Sexual Activity: Not on file   Other Topics Concern  . Not on file   Social History Narrative   Lives in [place] with wife   Been on disability since his stroke in the 1990s   Not routinely exercising     BP 118/58 mmHg  Pulse 66  Ht 6\' 1"  (1.854 m)  Wt 211 lb 9.6 oz (95.981 kg)  BMI 27.92 kg/m2  SpO2 98%  Physical Exam:  stable appearing 70 year old man, NAD HEENT: Unremarkable Neck:  7 cm JVD, no thyromegally Back:  No CVA  tenderness Lungs:  Clear with no wheezes, rales, or rhonchi. HEART:  Regular rate rhythm, no murmurs,  no rubs, no clicks Abd:  soft, positive bowel sounds, no organomegally, no rebound, no guarding Ext:  2 plus pulses, trace peripheral edema, no cyanosis, no clubbing Skin:  No rashes no nodules Neuro:  CN II through XII intact, motor grossly intact  DEVICE  Normal device function.  See PaceArt for details.   Assess/Plan: 1. Chronic systolic heart failure - his symptoms are class 2. He will continue his current meds. 2. ICD - his St. Jude DDD ICD is working normally. He has had no therapies. 3. CAD - he has no anginal symptoms. Will follow.  Leonia Reeves.D.

## 2016-01-02 NOTE — Telephone Encounter (Signed)
Gave and printed appt sched and avs for pt for April °

## 2016-01-08 ENCOUNTER — Ambulatory Visit (HOSPITAL_BASED_OUTPATIENT_CLINIC_OR_DEPARTMENT_OTHER): Payer: Medicare HMO

## 2016-01-08 VITALS — BP 120/55 | HR 60 | Temp 97.7°F | Resp 18

## 2016-01-08 DIAGNOSIS — D509 Iron deficiency anemia, unspecified: Secondary | ICD-10-CM

## 2016-01-08 MED ORDER — SODIUM CHLORIDE 0.9 % IV SOLN
Freq: Once | INTRAVENOUS | Status: AC
  Administered 2016-01-08: 09:00:00 via INTRAVENOUS

## 2016-01-08 MED ORDER — SODIUM CHLORIDE 0.9 % IV SOLN
510.0000 mg | Freq: Once | INTRAVENOUS | Status: AC
  Administered 2016-01-08: 510 mg via INTRAVENOUS
  Filled 2016-01-08: qty 17

## 2016-01-08 NOTE — Patient Instructions (Signed)

## 2016-01-15 ENCOUNTER — Ambulatory Visit (HOSPITAL_BASED_OUTPATIENT_CLINIC_OR_DEPARTMENT_OTHER): Payer: Medicare HMO

## 2016-01-15 VITALS — BP 134/63 | HR 60 | Temp 98.0°F | Resp 20

## 2016-01-15 DIAGNOSIS — D509 Iron deficiency anemia, unspecified: Secondary | ICD-10-CM

## 2016-01-15 MED ORDER — SODIUM CHLORIDE 0.9 % IV SOLN
510.0000 mg | Freq: Once | INTRAVENOUS | Status: AC
  Administered 2016-01-15: 510 mg via INTRAVENOUS
  Filled 2016-01-15: qty 17

## 2016-01-15 MED ORDER — SODIUM CHLORIDE 0.9 % IV SOLN
Freq: Once | INTRAVENOUS | Status: AC
  Administered 2016-01-15: 09:00:00 via INTRAVENOUS

## 2016-01-15 NOTE — Patient Instructions (Signed)

## 2016-01-29 ENCOUNTER — Ambulatory Visit (HOSPITAL_BASED_OUTPATIENT_CLINIC_OR_DEPARTMENT_OTHER): Payer: Medicare HMO

## 2016-01-29 VITALS — BP 120/61 | HR 75 | Temp 98.1°F

## 2016-01-29 DIAGNOSIS — E538 Deficiency of other specified B group vitamins: Secondary | ICD-10-CM | POA: Diagnosis not present

## 2016-01-29 DIAGNOSIS — D509 Iron deficiency anemia, unspecified: Secondary | ICD-10-CM

## 2016-01-29 MED ORDER — CYANOCOBALAMIN 1000 MCG/ML IJ SOLN
1000.0000 ug | Freq: Once | INTRAMUSCULAR | Status: AC
  Administered 2016-01-29: 1000 ug via INTRAMUSCULAR

## 2016-01-29 MED ORDER — HEPARIN SOD (PORK) LOCK FLUSH 100 UNIT/ML IV SOLN
250.0000 [IU] | Freq: Once | INTRAVENOUS | Status: DC | PRN
  Filled 2016-01-29: qty 5

## 2016-01-29 MED ORDER — SODIUM CHLORIDE 0.9 % IV SOLN: Freq: Once | INTRAVENOUS | Status: DC

## 2016-01-29 MED ORDER — ALTEPLASE 2 MG IJ SOLR
2.0000 mg | Freq: Once | INTRAMUSCULAR | Status: DC | PRN
  Filled 2016-01-29: qty 2

## 2016-01-29 MED ORDER — SODIUM CHLORIDE 0.9 % IJ SOLN
10.0000 mL | INTRAMUSCULAR | Status: DC | PRN
Start: 2016-01-29 — End: 2016-01-29
  Filled 2016-01-29: qty 10

## 2016-01-29 MED ORDER — HEPARIN SOD (PORK) LOCK FLUSH 100 UNIT/ML IV SOLN
500.0000 [IU] | Freq: Once | INTRAVENOUS | Status: DC | PRN
  Filled 2016-01-29: qty 5

## 2016-01-29 MED ORDER — SODIUM CHLORIDE 0.9 % IJ SOLN
3.0000 mL | Freq: Once | INTRAMUSCULAR | Status: DC | PRN
  Filled 2016-01-29: qty 10

## 2016-01-31 ENCOUNTER — Other Ambulatory Visit: Payer: Self-pay | Admitting: Internal Medicine

## 2016-01-31 NOTE — Telephone Encounter (Signed)
Rx refill sent to pharmacy. 

## 2016-02-12 ENCOUNTER — Encounter (HOSPITAL_COMMUNITY): Payer: Self-pay | Admitting: *Deleted

## 2016-02-12 ENCOUNTER — Emergency Department (HOSPITAL_COMMUNITY)
Admission: EM | Admit: 2016-02-12 | Discharge: 2016-02-12 | Disposition: A | Discharge status: Home / Self Care | Payer: Medicare HMO | Source: Home / Self Care | Attending: Emergency Medicine | Admitting: Emergency Medicine

## 2016-02-12 DIAGNOSIS — I110 Hypertensive heart disease with heart failure: Secondary | ICD-10-CM | POA: Insufficient documentation

## 2016-02-12 DIAGNOSIS — R1012 Left upper quadrant pain: Secondary | ICD-10-CM

## 2016-02-12 DIAGNOSIS — M1990 Unspecified osteoarthritis, unspecified site: Secondary | ICD-10-CM | POA: Insufficient documentation

## 2016-02-12 DIAGNOSIS — K8031 Calculus of bile duct with cholangitis, unspecified, with obstruction: Secondary | ICD-10-CM | POA: Diagnosis not present

## 2016-02-12 DIAGNOSIS — Z7982 Long term (current) use of aspirin: Secondary | ICD-10-CM | POA: Insufficient documentation

## 2016-02-12 DIAGNOSIS — I252 Old myocardial infarction: Secondary | ICD-10-CM

## 2016-02-12 DIAGNOSIS — E119 Type 2 diabetes mellitus without complications: Secondary | ICD-10-CM

## 2016-02-12 DIAGNOSIS — I480 Paroxysmal atrial fibrillation: Secondary | ICD-10-CM | POA: Insufficient documentation

## 2016-02-12 DIAGNOSIS — I2510 Atherosclerotic heart disease of native coronary artery without angina pectoris: Secondary | ICD-10-CM

## 2016-02-12 DIAGNOSIS — I5020 Unspecified systolic (congestive) heart failure: Secondary | ICD-10-CM

## 2016-02-12 DIAGNOSIS — Z87891 Personal history of nicotine dependence: Secondary | ICD-10-CM

## 2016-02-12 DIAGNOSIS — Z8673 Personal history of transient ischemic attack (TIA), and cerebral infarction without residual deficits: Secondary | ICD-10-CM

## 2016-02-12 DIAGNOSIS — Z79899 Other long term (current) drug therapy: Secondary | ICD-10-CM | POA: Insufficient documentation

## 2016-02-12 DIAGNOSIS — R5383 Other fatigue: Secondary | ICD-10-CM | POA: Diagnosis not present

## 2016-02-12 MED ORDER — POLYETHYLENE GLYCOL 3350 17 G PO PACK
17.0000 g | PACK | Freq: Once | ORAL | Status: AC
  Administered 2016-02-12: 17 g via ORAL
  Filled 2016-02-12: qty 1

## 2016-02-12 NOTE — ED Notes (Signed)
Pt c/o abdominal pain that started today.

## 2016-02-12 NOTE — ED Provider Notes (Signed)
CSN: [id number]     Arrival date & time 02/12/16  1838 History   First MD Initiated Contact with Patient 02/12/16 2041     Chief Complaint  Patient presents with  . Abdominal Pain     (Consider location/radiation/quality/duration/timing/severity/associated sxs/prior Treatment) HPI   [name] is a 70 y.o. male who presents for evaluation of upper abdominal pain, which occurred at noon today, following eating breakfast earlier in the day. Pain was severe, and transient. The pain lasted 2 hours. It was associated with nausea but no vomiting. He has not had any diarrhea. Recently he has been "straining to have a bowel movement". Last bowel movement was today.  There's been no vomiting, fever, chills, cough, chest pain, weakness or dizziness. He uses a stool softener tablet, periodically. He is here with his daughter. There are no other known modifying factors.   Past Medical History  Diagnosis Date  . Sinus bradycardia   . Tobacco abuse   . Chronic renal insufficiency   . Osteoarthritis   . Diabetes mellitus   . Mitral regurgitation   . Systolic CHF, chronic (HCC)     Previously taken off lisinopril by primary doctor due to labs  . Carotid stenosis     a. Prior carotid dz/ surgery. b. Carotid dopp 07/2011 - 0-39% bilaterally.;   c. Doppler 11/13: 40-59% RICA, 0-39% LICA  . Cardiomyopathy, ischemic     a. Chronic systolic CHF s/p St. Jude dual chamber ICD 03/2008.  [name] Hx of CABG   . CAD (coronary artery disease)     a. STEMI s/p CABG 07/2007 (had preop cardiogenic shock, IABP, VDRF before surgery)  . Stroke [facility])     a. CVA 1990s - chronic pain in RUE after stroke.  [name] PAF (paroxysmal atrial fibrillation) (HCC)     a. Poor Coumadin candidate due to hx of GI bleed.  . GI bleed     Reported history  . Splenomegaly   . Myocardial infarction (HCC)   . Automatic implantable cardioverter-defibrillator in situ   . Hypertension     [facility]   . Sleep apnea     study  done, 10 yrs ago, unable to tolerate CPAP  . Neuromuscular disorder (HCC)     R sided weakness   . Anemia     Multifactorial  - hx of B12 def and iron deficiency, followed by heme[facility]   Past Surgical History  Procedure Laterality Date  . Coronary artery bypass graft    . Percutaneous coronary intervention  01/10/2005    Charlies Constable, MD  . Coronary artery bypass graft  08/29/2007    x3; Salvatore Decent. Cornelius Moras MD  . Cardiac defibrillator placement  04/05/2008    Implantation of a St. Jude dual-chamber defibrillator, Doylene Canning. Ladona Ridgel , MD  . Cardiac defibrillator placement    . Tonsillectomy    . Joint replacement  1990's    [name] knee  . Total knee arthroplasty Right 08/16/2013    Procedure: RIGHT TOTAL KNEE ARTHROPLASTY;  Surgeon: Kathryne Hitch, MD;  Location: [facility] OR;  Service: Orthopedics;  Laterality: Right;  . Ep implantable device N/A 10/05/2015    Procedure: ICD Generator Changeout;  Surgeon: Marinus Maw, MD;  Location: [facility] INVASIVE CV LAB;  Service: Cardiovascular;  Laterality: N/A;   Family History  Problem Relation Age of Onset  . Heart attack Mother     CVA, MI  . Diabetes Mother   .  Heart attack Father     MI  . Diabetes Sister   . Coronary artery disease Brother     CABG   Social History  Substance Use Topics  . Smoking status: Former Smoker -- 1.00 packs/day for 15 years    Quit date: 09/29/1990  . Smokeless tobacco: Former Neurosurgeon    Quit date: 08/09/1988  . Alcohol Use: No    Review of Systems  All other systems reviewed and are negative.     Allergies  Codeine  Home Medications   Prior to Admission medications   Medication Sig Start Date End Date Taking? Authorizing Provider  aspirin EC 81 MG EC tablet Take 1 tablet (81 mg total) by mouth daily. 07/29/12   Roger A Arguello, PA-C  atorvastatin (LIPITOR) 80 MG tablet Take 0.5 tablets (40 mg total) by mouth every other day. 07/20/14   Marinus Maw, MD  baclofen (LIORESAL)  20 MG tablet Take 40 mg by mouth at bedtime.     Historical Provider, MD  BISACODYL PO Take 1 tablet by mouth daily as needed (constipation).  12/17/13   Historical Provider, MD  calcium carbonate (TUMS - DOSED IN MG ELEMENTAL CALCIUM) 500 MG chewable tablet Chew 1 tablet by mouth 3 (three) times daily as needed for indigestion or heartburn.  02/14/14   Historical Provider, MD  Canagliflozin (INVOKANA) 300 MG TABS Take 1 tablet by mouth daily.    Historical Provider, MD  carvedilol (COREG) 12.5 MG tablet Take 1 tablet (12.5 mg total) by mouth 2 (two) times daily with a meal. 11/03/14   Laqueta Linden, MD  clopidogrel (PLAVIX) 75 MG tablet TAKE 1 TABLET EVERY DAY 01/31/16   Marinus Maw, MD  furosemide (LASIX) 40 MG tablet Take 40 mg by mouth as directed.    Historical Provider, MD  glimepiride (AMARYL) 4 MG tablet Take 4 mg by mouth 2 (two) times daily.     Historical Provider, MD  linagliptin (TRADJENTA) 5 MG TABS tablet Take 5 mg by mouth daily after breakfast.     Historical Provider, MD  meclizine (ANTIVERT) 25 MG tablet Take 25 mg by mouth 4 (four) times daily as needed. For dizziness 02/26/12   Historical Provider, MD  metoCLOPramide (REGLAN) 10 MG tablet Take 10 mg by mouth daily as needed for nausea or vomiting.  09/04/15   Historical Provider, MD  nitroGLYCERIN (NITROSTAT) 0.4 MG SL tablet Place 1 tablet (0.4 mg total) under the tongue every 5 (five) minutes as needed for chest pain. 01/02/16   Marinus Maw, MD  oxyCODONE-acetaminophen (PERCOCET) 10-325 MG per tablet Take 1 tablet by mouth every 4 (four) hours as needed for pain.  01/26/14   Historical Provider, MD  RANITIDINE HCL PO Take 1 tablet by mouth daily as needed (heartburn or indigestion).  12/17/13   Historical Provider, MD  SENNA CO Take 1 tablet by mouth daily as needed (constipation).  01/10/14   Historical Provider, MD  spironolactone (ALDACTONE) 25 MG tablet Take 1 tablet (25 mg total) by mouth every morning. 07/20/14   Marinus Maw, MD  triamcinolone cream (KENALOG) 0.5 % Apply 1 application topically daily as needed (for leg lesion).    Historical Provider, MD   BP 142/62 mmHg  Pulse 63  Temp(Src) 97.7 F (36.5 C) (Oral)  Resp 18  Ht  (1.88 m)  Wt 212 lb (96.163 kg)  BMI 27.21 kg/m2  SpO2 98% Physical Exam  Constitutional: He is oriented to person,  place, and time. He appears well-developed and well-nourished. No distress.  HENT:  Head: Normocephalic and atraumatic.  Right Ear: External ear normal.  Left Ear: External ear normal.  Eyes: Conjunctivae and EOM are normal. Pupils are equal, round, and reactive to light.  Neck: Normal range of motion and phonation normal. Neck supple.  Cardiovascular: Normal rate, regular rhythm and normal heart sounds.   Pulmonary/Chest: Effort normal and breath sounds normal. He exhibits no bony tenderness.  Abdominal: Soft. He exhibits no distension and no mass. There is tenderness (Mild mid upper epigastric and left upper quadrant tenderness.). There is no rebound and no guarding.  Musculoskeletal: Normal range of motion.  Neurological: He is alert and oriented to person, place, and time. No cranial nerve deficit or sensory deficit. He exhibits normal muscle tone. Coordination normal.  Skin: Skin is warm, dry and intact.  Psychiatric: He has a normal mood and affect. His behavior is normal. Judgment and thought content normal.  Nursing note and vitals reviewed.   ED Course  Procedures (including critical care time)  Initial clinical impression- nonspecific transient upper abdominal pain. Symptomatic constipation is present. He has a history of gallbladder disease described as cholecystitis on CT image, 2 years ago. He did not have gallstones noticed at that time. He has never had an abdominal ultrasound.  Medications  polyethylene glycol (MIRALAX / GLYCOLAX) packet 17 g (not administered)    Patient Vitals for the past 24 hrs:  BP Temp Temp src Pulse Resp  SpO2 Height Weight  02/12/16 1921 142/62 mmHg 97.7 F (36.5 C) Oral 63 18 98 % 6\' 2"  (1.88 m) 212 lb (96.163 kg)    9:19 PM Reevaluation with update and discussion. After initial assessment and treatment, an updated evaluation reveals No additional complaints. Findings discussed with patient and family member, all questions answered. [name]      Labs Review Labs Reviewed - No data to display  Imaging Review No results found. I have personally reviewed and evaluated these images and lab results as part of my medical decision-making.   EKG Interpretation None      MDM   Final diagnoses:  Left upper quadrant pain    Transient abdominal pain, with history of gallbladder disease, but also having current ongoing constipation symptoms. Doubt serious  bacterial infection, metabolic instability or impending vascular collapse.   Nursing Notes Reviewed/ Care Coordinated Applicable Imaging Reviewed Interpretation of Laboratory Data incorporated into ED treatment  The patient appears reasonably screened and/or stabilized for discharge and I doubt any other medical condition or other [facility] requiring further screening, evaluation, or treatment in the ED at this time prior to discharge.  Plan: Home Medications- Miralax BID; Home Treatments- rest, fluids; return here if the recommended treatment, does not improve the symptoms; Recommended follow up- AM Abdominal U/S ordered. PCP prn     Mancel Bale, MD 02/12/16 2123

## 2016-02-12 NOTE — Discharge Instructions (Signed)
Use MiraLAX, twice a day, until the stooling improves. Return tomorrow morning for an ultrasound of the abdomen to be evaluated for gallbladder disease. Stay on a low-fat diet. Increase your fluid intake of water, and eat a high-fiber diet, to help with the constipation.  Abdominal Pain, Adult Many things can cause abdominal pain. Usually, abdominal pain is not caused by a disease and will improve without treatment. It can often be observed and treated at home. Your health care provider will do a physical exam and possibly order blood tests and X-rays to help determine the seriousness of your pain. However, in many cases, more time must pass before a clear cause of the pain can be found. Before that point, your health care provider may not know if you need more testing or further treatment. HOME CARE INSTRUCTIONS Monitor your abdominal pain for any changes. The following actions may help to alleviate any discomfort you are experiencing:  Only take over-the-counter or prescription medicines as directed by your health care provider.  Do not take laxatives unless directed to do so by your health care provider.  Try a clear liquid diet (broth, tea, or water) as directed by your health care provider. Slowly move to a bland diet as tolerated. SEEK MEDICAL CARE IF:  You have unexplained abdominal pain.  You have abdominal pain associated with nausea or diarrhea.  You have pain when you urinate or have a bowel movement.  You experience abdominal pain that wakes you in the night.  You have abdominal pain that is worsened or improved by eating food.  You have abdominal pain that is worsened with eating fatty foods.  You have a fever. SEEK IMMEDIATE MEDICAL CARE IF:  Your pain does not go away within 2 hours.  You keep throwing up (vomiting).  Your pain is felt only in portions of the abdomen, such as the right side or the left lower portion of the abdomen.  You pass bloody or black tarry  stools. MAKE SURE YOU:  Understand these instructions.  Will watch your condition.  Will get help right away if you are not doing well or get worse.   This information is not intended to replace advice given to you by your health care provider. Make sure you discuss any questions you have with your health care provider.   Document Released: 06/25/2005 Document Revised: 06/06/2015 Document Reviewed: 05/25/2013 Elsevier Interactive Patient Education [employer]

## 2016-02-13 ENCOUNTER — Inpatient Hospital Stay (HOSPITAL_COMMUNITY)
Admission: EM | Admit: 2016-02-13 | Discharge: 2016-02-22 | DRG: 418 | Disposition: A | Discharge status: Home Health | Payer: Medicare HMO | Attending: Internal Medicine | Admitting: Internal Medicine

## 2016-02-13 ENCOUNTER — Emergency Department (HOSPITAL_COMMUNITY): Payer: Medicare HMO

## 2016-02-13 ENCOUNTER — Other Ambulatory Visit (HOSPITAL_COMMUNITY): Payer: Medicare HMO

## 2016-02-13 ENCOUNTER — Encounter (HOSPITAL_COMMUNITY): Payer: Self-pay

## 2016-02-13 DIAGNOSIS — R1011 Right upper quadrant pain: Secondary | ICD-10-CM

## 2016-02-13 DIAGNOSIS — H9202 Otalgia, left ear: Secondary | ICD-10-CM | POA: Diagnosis present

## 2016-02-13 DIAGNOSIS — E785 Hyperlipidemia, unspecified: Secondary | ICD-10-CM | POA: Diagnosis present

## 2016-02-13 DIAGNOSIS — I5042 Chronic combined systolic (congestive) and diastolic (congestive) heart failure: Secondary | ICD-10-CM | POA: Diagnosis present

## 2016-02-13 DIAGNOSIS — K8001 Calculus of gallbladder with acute cholecystitis with obstruction: Secondary | ICD-10-CM

## 2016-02-13 DIAGNOSIS — I252 Old myocardial infarction: Secondary | ICD-10-CM | POA: Diagnosis not present

## 2016-02-13 DIAGNOSIS — I10 Essential (primary) hypertension: Secondary | ICD-10-CM | POA: Diagnosis present

## 2016-02-13 DIAGNOSIS — Z7984 Long term (current) use of oral hypoglycemic drugs: Secondary | ICD-10-CM | POA: Diagnosis not present

## 2016-02-13 DIAGNOSIS — D696 Thrombocytopenia, unspecified: Secondary | ICD-10-CM | POA: Diagnosis present

## 2016-02-13 DIAGNOSIS — R7989 Other specified abnormal findings of blood chemistry: Secondary | ICD-10-CM | POA: Diagnosis not present

## 2016-02-13 DIAGNOSIS — I509 Heart failure, unspecified: Secondary | ICD-10-CM

## 2016-02-13 DIAGNOSIS — M1990 Unspecified osteoarthritis, unspecified site: Secondary | ICD-10-CM | POA: Diagnosis present

## 2016-02-13 DIAGNOSIS — Z951 Presence of aortocoronary bypass graft: Secondary | ICD-10-CM

## 2016-02-13 DIAGNOSIS — I480 Paroxysmal atrial fibrillation: Secondary | ICD-10-CM | POA: Diagnosis not present

## 2016-02-13 DIAGNOSIS — I130 Hypertensive heart and chronic kidney disease with heart failure and stage 1 through stage 4 chronic kidney disease, or unspecified chronic kidney disease: Secondary | ICD-10-CM | POA: Diagnosis not present

## 2016-02-13 DIAGNOSIS — I2510 Atherosclerotic heart disease of native coronary artery without angina pectoris: Secondary | ICD-10-CM | POA: Diagnosis not present

## 2016-02-13 DIAGNOSIS — E876 Hypokalemia: Secondary | ICD-10-CM | POA: Diagnosis present

## 2016-02-13 DIAGNOSIS — D509 Iron deficiency anemia, unspecified: Secondary | ICD-10-CM | POA: Diagnosis not present

## 2016-02-13 DIAGNOSIS — E1122 Type 2 diabetes mellitus with diabetic chronic kidney disease: Secondary | ICD-10-CM | POA: Diagnosis present

## 2016-02-13 DIAGNOSIS — Z7982 Long term (current) use of aspirin: Secondary | ICD-10-CM

## 2016-02-13 DIAGNOSIS — N189 Chronic kidney disease, unspecified: Secondary | ICD-10-CM | POA: Diagnosis not present

## 2016-02-13 DIAGNOSIS — E1351 Other specified diabetes mellitus with diabetic peripheral angiopathy without gangrene: Secondary | ICD-10-CM | POA: Diagnosis not present

## 2016-02-13 DIAGNOSIS — Z87891 Personal history of nicotine dependence: Secondary | ICD-10-CM

## 2016-02-13 DIAGNOSIS — T501X5A Adverse effect of loop [high-ceiling] diuretics, initial encounter: Secondary | ICD-10-CM | POA: Diagnosis present

## 2016-02-13 DIAGNOSIS — I255 Ischemic cardiomyopathy: Secondary | ICD-10-CM | POA: Diagnosis present

## 2016-02-13 DIAGNOSIS — K429 Umbilical hernia without obstruction or gangrene: Secondary | ICD-10-CM | POA: Diagnosis not present

## 2016-02-13 DIAGNOSIS — K8050 Calculus of bile duct without cholangitis or cholecystitis without obstruction: Secondary | ICD-10-CM | POA: Diagnosis present

## 2016-02-13 DIAGNOSIS — Z7902 Long term (current) use of antithrombotics/antiplatelets: Secondary | ICD-10-CM | POA: Diagnosis not present

## 2016-02-13 DIAGNOSIS — E860 Dehydration: Secondary | ICD-10-CM | POA: Diagnosis not present

## 2016-02-13 DIAGNOSIS — R9431 Abnormal electrocardiogram [ECG] [EKG]: Secondary | ICD-10-CM | POA: Diagnosis present

## 2016-02-13 DIAGNOSIS — H04129 Dry eye syndrome of unspecified lacrimal gland: Secondary | ICD-10-CM | POA: Diagnosis present

## 2016-02-13 DIAGNOSIS — R945 Abnormal results of liver function studies: Secondary | ICD-10-CM

## 2016-02-13 DIAGNOSIS — K8031 Calculus of bile duct with cholangitis, unspecified, with obstruction: Secondary | ICD-10-CM | POA: Diagnosis present

## 2016-02-13 DIAGNOSIS — Z96653 Presence of artificial knee joint, bilateral: Secondary | ICD-10-CM | POA: Diagnosis not present

## 2016-02-13 DIAGNOSIS — K8020 Calculus of gallbladder without cholecystitis without obstruction: Secondary | ICD-10-CM

## 2016-02-13 DIAGNOSIS — I69351 Hemiplegia and hemiparesis following cerebral infarction affecting right dominant side: Secondary | ICD-10-CM

## 2016-02-13 DIAGNOSIS — Z23 Encounter for immunization: Secondary | ICD-10-CM | POA: Diagnosis not present

## 2016-02-13 DIAGNOSIS — R5383 Other fatigue: Secondary | ICD-10-CM | POA: Diagnosis present

## 2016-02-13 LAB — URINE MICROSCOPIC-ADD ON

## 2016-02-13 LAB — COMPREHENSIVE METABOLIC PANEL
ALK PHOS: 192 U/L — AB (ref 38–126)
ALT: 263 U/L — ABNORMAL HIGH (ref 17–63)
AST: 242 U/L — AB (ref 15–41)
Albumin: 3.4 g/dL — ABNORMAL LOW (ref 3.5–5.0)
Anion gap: 7 (ref 5–15)
BILIRUBIN TOTAL: 2.1 mg/dL — AB (ref 0.3–1.2)
BUN: 21 mg/dL — AB (ref 6–20)
CALCIUM: 8.2 mg/dL — AB (ref 8.9–10.3)
CO2: 25 mmol/L (ref 22–32)
Chloride: 104 mmol/L (ref 101–111)
Creatinine, Ser: 1.08 mg/dL (ref 0.61–1.24)
GFR calc Af Amer: >60 mL/min (ref >60)
Glucose, Bld: 157 mg/dL — ABNORMAL HIGH (ref 65–99)
POTASSIUM: 3.4 mmol/L — AB (ref 3.5–5.1)
Sodium: 136 mmol/L (ref 135–145)
TOTAL PROTEIN: 5.9 g/dL — AB (ref 6.5–8.1)

## 2016-02-13 LAB — CBC WITH DIFFERENTIAL/PLATELET
BASOS ABS: 0 10*3/uL (ref 0.0–0.1)
BASOS PCT: 0 %
EOS ABS: 0 10*3/uL (ref 0.0–0.7)
EOS PCT: 0 %
HCT: 38.5 % — ABNORMAL LOW (ref 39.0–52.0)
Hemoglobin: 12.7 g/dL — ABNORMAL LOW (ref 13.0–17.0)
LYMPHS PCT: 4 %
Lymphs Abs: 0.3 10*3/uL — ABNORMAL LOW (ref 0.7–4.0)
MCH: 28.7 pg (ref 26.0–34.0)
MCHC: 33 g/dL (ref 30.0–36.0)
MCV: 87.1 fL (ref 78.0–100.0)
MONO ABS: 0.9 10*3/uL (ref 0.1–1.0)
Monocytes Relative: 11 %
Neutro Abs: 6.3 10*3/uL (ref 1.7–7.7)
Neutrophils Relative %: 85 %
Platelets: 128 10*3/uL — ABNORMAL LOW (ref 150–400)
RBC: 4.42 MIL/uL (ref 4.22–5.81)
RDW: 18.1 % — AB (ref 11.5–15.5)
WBC: 7.5 10*3/uL (ref 4.0–10.5)

## 2016-02-13 LAB — URINALYSIS, ROUTINE W REFLEX MICROSCOPIC
GLUCOSE, UA: NEGATIVE mg/dL
HGB URINE DIPSTICK: NEGATIVE
LEUKOCYTES UA: NEGATIVE
Nitrite: NEGATIVE
Protein, ur: 30 mg/dL — AB
Specific Gravity, Urine: 1.01 (ref 1.005–1.030)
pH: 6 (ref 5.0–8.0)

## 2016-02-13 LAB — TROPONIN I: Troponin I: <0.03 ng/mL (ref <0.031)

## 2016-02-13 LAB — GLUCOSE, CAPILLARY
GLUCOSE-CAPILLARY: 135 mg/dL — AB (ref 65–99)
GLUCOSE-CAPILLARY: 69 mg/dL (ref 65–99)

## 2016-02-13 LAB — PROTIME-INR
INR: 1.18 (ref 0.00–1.49)
Prothrombin Time: 15.1 seconds (ref 11.6–15.2)

## 2016-02-13 LAB — MAGNESIUM: MAGNESIUM: 1.7 mg/dL (ref 1.7–2.4)

## 2016-02-13 LAB — LACTIC ACID, PLASMA
LACTIC ACID, VENOUS: 0.9 mmol/L (ref 0.5–2.0)
LACTIC ACID, VENOUS: 0.9 mmol/L (ref 0.5–2.0)

## 2016-02-13 LAB — LIPASE, BLOOD: LIPASE: 16 U/L (ref 11–51)

## 2016-02-13 LAB — PROCALCITONIN: PROCALCITONIN: 1.3 ng/mL

## 2016-02-13 LAB — BRAIN NATRIURETIC PEPTIDE: B Natriuretic Peptide: 391 pg/mL — ABNORMAL HIGH (ref 0.0–100.0)

## 2016-02-13 MED ORDER — INSULIN ASPART 100 UNIT/ML ~~LOC~~ SOLN
0.0000 [IU] | SUBCUTANEOUS | Status: DC
  Administered 2016-02-13: 1 [IU] via SUBCUTANEOUS

## 2016-02-13 MED ORDER — ATORVASTATIN CALCIUM 40 MG PO TABS
40.0000 mg | ORAL_TABLET | ORAL | Status: DC
  Filled 2016-02-13: qty 1

## 2016-02-13 MED ORDER — HYDROMORPHONE HCL 1 MG/ML IJ SOLN
1.0000 mg | INTRAMUSCULAR | Status: DC | PRN
  Administered 2016-02-13 – 2016-02-20 (×13): 1 mg via INTRAVENOUS
  Filled 2016-02-13 (×15): qty 1

## 2016-02-13 MED ORDER — OXYCODONE-ACETAMINOPHEN 10-325 MG PO TABS
1.0000 | ORAL_TABLET | ORAL | Status: DC | PRN
Start: 2016-02-13 — End: 2016-02-13

## 2016-02-13 MED ORDER — ONDANSETRON HCL 4 MG/2ML IJ SOLN
4.0000 mg | Freq: Once | INTRAMUSCULAR | Status: AC
  Administered 2016-02-13: 4 mg via INTRAVENOUS
  Filled 2016-02-13: qty 2

## 2016-02-13 MED ORDER — SODIUM CHLORIDE 0.9 % IV SOLN: 250.0000 mL | INTRAVENOUS | Status: DC | PRN

## 2016-02-13 MED ORDER — ACETAMINOPHEN 650 MG RE SUPP: 650.0000 mg | Freq: Four times a day (QID) | RECTAL | Status: DC | PRN

## 2016-02-13 MED ORDER — BACLOFEN 20 MG PO TABS
20.0000 mg | ORAL_TABLET | Freq: Two times a day (BID) | ORAL | Status: DC
  Administered 2016-02-13 – 2016-02-22 (×16): 20 mg via ORAL
  Filled 2016-02-13 (×2): qty 1
  Filled 2016-02-13: qty 2
  Filled 2016-02-13 (×3): qty 1
  Filled 2016-02-13 (×2): qty 2
  Filled 2016-02-13 (×5): qty 1
  Filled 2016-02-13: qty 2
  Filled 2016-02-13: qty 1
  Filled 2016-02-13: qty 2
  Filled 2016-02-13 (×2): qty 1

## 2016-02-13 MED ORDER — CIPROFLOXACIN-DEXAMETHASONE 0.3-0.1 % OT SUSP
OTIC | Status: AC
  Filled 2016-02-13: qty 7.5

## 2016-02-13 MED ORDER — SODIUM CHLORIDE 0.9 % IV SOLN
INTRAVENOUS | Status: DC
  Administered 2016-02-13: 17:00:00 via INTRAVENOUS

## 2016-02-13 MED ORDER — SODIUM CHLORIDE 0.9 % IV BOLUS (SEPSIS)
1000.0000 mL | Freq: Once | INTRAVENOUS | Status: AC
  Administered 2016-02-13: 1000 mL via INTRAVENOUS

## 2016-02-13 MED ORDER — PNEUMOCOCCAL VAC POLYVALENT 25 MCG/0.5ML IJ INJ
0.5000 mL | INJECTION | INTRAMUSCULAR | Status: AC
  Administered 2016-02-22: 0.5 mL via INTRAMUSCULAR
  Filled 2016-02-13 (×2): qty 0.5

## 2016-02-13 MED ORDER — DEXTROSE-NACL 5-0.9 % IV SOLN
INTRAVENOUS | Status: AC
  Administered 2016-02-13 – 2016-02-14 (×2): via INTRAVENOUS

## 2016-02-13 MED ORDER — HEPARIN SODIUM (PORCINE) 5000 UNIT/ML IJ SOLN
5000.0000 [IU] | Freq: Three times a day (TID) | INTRAMUSCULAR | Status: DC
  Administered 2016-02-13 – 2016-02-22 (×21): 5000 [IU] via SUBCUTANEOUS
  Filled 2016-02-13 (×22): qty 1

## 2016-02-13 MED ORDER — SODIUM CHLORIDE 0.9% FLUSH: 3.0000 mL | INTRAVENOUS | Status: DC | PRN

## 2016-02-13 MED ORDER — OXYCODONE-ACETAMINOPHEN 5-325 MG PO TABS
1.0000 | ORAL_TABLET | ORAL | Status: DC | PRN
  Administered 2016-02-15 – 2016-02-22 (×21): 1 via ORAL
  Filled 2016-02-13 (×22): qty 1

## 2016-02-13 MED ORDER — SODIUM CHLORIDE 0.9% FLUSH: 3.0000 mL | Freq: Two times a day (BID) | INTRAVENOUS | Status: DC

## 2016-02-13 MED ORDER — NITROGLYCERIN 0.4 MG SL SUBL
0.4000 mg | SUBLINGUAL_TABLET | SUBLINGUAL | Status: DC | PRN
  Administered 2016-02-15: 0.4 mg via SUBLINGUAL
  Filled 2016-02-13: qty 1

## 2016-02-13 MED ORDER — POTASSIUM CHLORIDE 10 MEQ/100ML IV SOLN
10.0000 meq | INTRAVENOUS | Status: AC
  Administered 2016-02-13 (×3): 10 meq via INTRAVENOUS
  Filled 2016-02-13 (×2): qty 100

## 2016-02-13 MED ORDER — CARVEDILOL 12.5 MG PO TABS
12.5000 mg | ORAL_TABLET | Freq: Two times a day (BID) | ORAL | Status: DC
  Administered 2016-02-14 – 2016-02-22 (×16): 12.5 mg via ORAL
  Filled 2016-02-13 (×19): qty 1

## 2016-02-13 MED ORDER — CIPROFLOXACIN-DEXAMETHASONE 0.3-0.1 % OT SUSP
3.0000 [drp] | Freq: Two times a day (BID) | OTIC | Status: DC
  Administered 2016-02-14 – 2016-02-20 (×12): 3 [drp] via OTIC
  Filled 2016-02-13 (×2): qty 7.5

## 2016-02-13 MED ORDER — ACETAMINOPHEN 325 MG PO TABS: 650.0000 mg | ORAL_TABLET | Freq: Four times a day (QID) | ORAL | Status: DC | PRN

## 2016-02-13 MED ORDER — CIPROFLOXACIN-HYDROCORTISONE 0.2-1 % OT SUSP
3.0000 [drp] | Freq: Two times a day (BID) | OTIC | Status: DC
  Filled 2016-02-13: qty 10

## 2016-02-13 MED ORDER — POTASSIUM CHLORIDE 10 MEQ/100ML IV SOLN
10.0000 meq | INTRAVENOUS | Status: AC
  Administered 2016-02-13 – 2016-02-14 (×3): 10 meq via INTRAVENOUS
  Filled 2016-02-13 (×5): qty 100

## 2016-02-13 MED ORDER — PIPERACILLIN-TAZOBACTAM 3.375 G IVPB 30 MIN
3.3750 g | Freq: Once | INTRAVENOUS | Status: AC
  Administered 2016-02-13: 3.375 g via INTRAVENOUS
  Filled 2016-02-13: qty 50

## 2016-02-13 MED ORDER — OXYCODONE HCL 5 MG PO TABS
5.0000 mg | ORAL_TABLET | ORAL | Status: DC | PRN
  Administered 2016-02-15 – 2016-02-22 (×8): 5 mg via ORAL
  Filled 2016-02-13 (×10): qty 1

## 2016-02-13 NOTE — H&P (Signed)
History and Physical    [name] ZOX:[id number] DOB: July 30, 1946 DOA: 02/13/2016  PCP: [name], DO   Patient coming from: Home  Chief Complaint: General weakness, abdominal pain, nausea  HPI: [name] is a 70 y.o. male with medical history significant for CAD status post remote CABG, chronic combined CHF, iron deficiency anemia, type 2 diabetes mellitus, and paroxysmal atrial fibrillation not anticoagulated due to a GI bleed history who presents to the ED for evaluation of generalized weakness, intermittent abdominal pain, and nausea. Patient reports being in his usual state of health until several days ago when there was insidious development of postprandial abdominal pain and nausea. He had experienced similar symptoms in the past that was attributed to cholecystitis, and while surgery was considered, he was felt to be a poor candidate due to his ischemic cardiomyopathy. Over the past 2 days, his symptoms have worsened significantly and patient has not eaten in the past 2 days. He reports that abdominal pain is improved significantly since he stopped eating, but his weakness has progressed. He endorses some periodic chills, but denies fever. He denies chest pain, palpitations, or dyspnea. There is been no vomiting or diarrhea associated with this.   ED Course: Upon arrival to the ED, patient is found to be afebrile, saturating well on room air, and with vital signs otherwise stable. EKG features a sinus rhythm with first degree AV nodal block and a prolonged QT interval. Ultrasound of the abdomen demonstrates cholelithiasis with minimal wall thickening, no pericholecystic fluid, and no sonographic Murphy sign. CBC is notable for hemoglobin of 12.7 and platelet count of 128,000. CMP features numerous derangements, including potassium of 3.4, total bilirubin of 2.1, and transaminases in the 200s. INR is normal, troponin is undetectable, and lipase is also normal at 16. Urine is  obtained for analysis and unremarkable. 1 L of normal saline was bolused in the ED and the patient was treated symptomatically with Zofran. A dose of Zosyn was administered empirically in the emergency room. EDP consulted with GI and general surgery and admission to the medicine service was advised.  Review of Systems:  All other systems reviewed and apart from HPI, are negative.  Past Medical History  Diagnosis Date  . Sinus bradycardia   . Tobacco abuse   . Chronic renal insufficiency   . Osteoarthritis   . Diabetes mellitus   . Mitral regurgitation   . Systolic CHF, chronic (HCC)     Previously taken off lisinopril by primary doctor due to labs  . Carotid stenosis     a. Prior carotid dz/ surgery. b. Carotid dopp 07/2011 - 0-39% bilaterally.;   c. Doppler 11/13: 40-59% RICA, 0-39% LICA  . Cardiomyopathy, ischemic     a. Chronic systolic CHF s/p St. Jude dual chamber ICD 03/2008.  [name] Hx of CABG   . CAD (coronary artery disease)     a. STEMI s/p CABG 07/2007 (had preop cardiogenic shock, IABP, VDRF before surgery)  . Stroke [facility])     a. CVA 1990s - chronic pain in RUE after stroke.  [name] PAF (paroxysmal atrial fibrillation) (HCC)     a. Poor Coumadin candidate due to hx of GI bleed.  . GI bleed     Reported history  . Splenomegaly   . Myocardial infarction (HCC)   . Automatic implantable cardioverter-defibrillator in situ   . Hypertension     [facility]   . Sleep apnea     study done, 10 yrs  ago, unable to tolerate CPAP  . Neuromuscular disorder (HCC)     R sided weakness   . Anemia     Multifactorial  - hx of B12 def and iron deficiency, followed by heme[facility]    Past Surgical History  Procedure Laterality Date  . Coronary artery bypass graft    . Percutaneous coronary intervention  01/10/2005    Charlies Constable, MD  . Coronary artery bypass graft  08/29/2007    x3; Salvatore Decent. Cornelius Moras MD  . Cardiac defibrillator placement  04/05/2008     Implantation of a St. Jude dual-chamber defibrillator, Doylene Canning. Ladona Ridgel , MD  . Cardiac defibrillator placement    . Tonsillectomy    . Joint replacement  1990's    L knee  . Total knee arthroplasty Right 08/16/2013    Procedure: RIGHT TOTAL KNEE ARTHROPLASTY;  Surgeon: Kathryne Hitch, MD;  Location: [facility] OR;  Service: Orthopedics;  Laterality: Right;  . Ep implantable device N/A 10/05/2015    Procedure: ICD Generator Changeout;  Surgeon: Marinus Maw, MD;  Location: [facility] INVASIVE CV LAB;  Service: Cardiovascular;  Laterality: N/A;     reports that he quit smoking about 25 years ago. He quit smokeless tobacco use about 27 years ago. He reports that he does not drink alcohol or use illicit drugs.  Allergies  Allergen Reactions  . Codeine Nausea And Vomiting    Family History  Problem Relation Age of Onset  . Heart attack Mother     CVA, MI  . Diabetes Mother   . Heart attack Father     MI  . Diabetes Sister   . Coronary artery disease Brother     CABG     Prior to Admission medications   Medication Sig Start Date End Date Taking? Authorizing Provider  aspirin EC 81 MG EC tablet Take 1 tablet (81 mg total) by mouth daily. 07/29/12  Yes Roger A Arguello, PA-C  atorvastatin (LIPITOR) 80 MG tablet Take 0.5 tablets (40 mg total) by mouth every other day. 07/20/14  Yes Marinus Maw, MD  baclofen (LIORESAL) 20 MG tablet Take 20 mg by mouth 2 (two) times daily.    Yes Historical Provider, MD  BISACODYL PO Take 1 tablet by mouth daily as needed (constipation).  12/17/13  Yes Historical Provider, MD  calcium carbonate (TUMS - DOSED IN MG ELEMENTAL CALCIUM) 500 MG chewable tablet Chew 1 tablet by mouth 3 (three) times daily as needed for indigestion or heartburn.  02/14/14  Yes Historical Provider, MD  Canagliflozin (INVOKANA) 300 MG TABS Take 1 tablet by mouth daily.   Yes Historical Provider, MD  carvedilol (COREG) 12.5 MG tablet Take 1 tablet (12.5 mg total) by mouth 2 (two) times  daily with a meal. 11/03/14  Yes Laqueta Linden, MD  clopidogrel (PLAVIX) 75 MG tablet TAKE 1 TABLET EVERY DAY 01/31/16  Yes Marinus Maw, MD  furosemide (LASIX) 40 MG tablet Take 40 mg by mouth daily as needed for fluid.    Yes Historical Provider, MD  glimepiride (AMARYL) 4 MG tablet Take 4 mg by mouth 2 (two) times daily.    Yes Historical Provider, MD  linagliptin (TRADJENTA) 5 MG TABS tablet Take 5 mg by mouth daily after breakfast.    Yes Historical Provider, MD  meclizine (ANTIVERT) 25 MG tablet Take 25 mg by mouth 4 (four) times daily as needed. For dizziness 02/26/12  Yes Historical Provider, MD  metoCLOPramide (REGLAN)  10 MG tablet Take 10 mg by mouth daily as needed for nausea or vomiting.  09/04/15  Yes Historical Provider, MD  nitroGLYCERIN (NITROSTAT) 0.4 MG SL tablet Place 1 tablet (0.4 mg total) under the tongue every 5 (five) minutes as needed for chest pain. 01/02/16  Yes Marinus Maw, MD  oxyCODONE-acetaminophen (PERCOCET) 10-325 MG per tablet Take 1 tablet by mouth every 4 (four) hours as needed for pain.  01/26/14  Yes Historical Provider, MD  RANITIDINE HCL PO Take 1 tablet by mouth daily as needed (heartburn or indigestion).  12/17/13  Yes Historical Provider, MD  SENNA CO Take 1 tablet by mouth daily as needed (constipation).  01/10/14  Yes Historical Provider, MD  spironolactone (ALDACTONE) 25 MG tablet Take 1 tablet (25 mg total) by mouth every morning. 07/20/14  Yes Marinus Maw, MD    Physical Exam: Filed Vitals:   02/13/16 1530 02/13/16 1600 02/13/16 1615 02/13/16 1659  BP: 125/58 119/68  97/51  Pulse: 60 65 60 95  Temp:   98.1 F (36.7 C) 99 F (37.2 C)  TempSrc:   Oral Oral  Resp: 18 23 22 22   Height:    6\' 2"  (1.88 m)  Weight:    96.154 kg (211 lb 15.7 oz)  SpO2: 100% 100% 98% 98%      Constitutional: NAD, calm, comfortable Eyes: PERTLA, lids and conjunctivae normal ENMT: Mucous membranes are moist. Posterior pharynx clear of any exudate or lesions.     Neck: normal, supple, no masses, no thyromegaly Respiratory: clear to auscultation bilaterally, no wheezing, no crackles. Normal respiratory effort.   Cardiovascular: S1 & S2 heard, regular rate and rhythm, no rubs / gallops. Grade 3 systolic murmur at apex. Abdomen: No distension, no tenderness, no masses palpated. Bowel sounds normal.  Musculoskeletal: no clubbing / cyanosis. No joint deformity upper and lower extremities. Normal muscle tone.  Skin: no significant rashes, lesions, ulcers. Warm, dry, well-perfused. Neurologic: CN 2-12 grossly intact. Sensation intact, DTR normal. Strength 5/5 in all 4 limbs.  Psychiatric: Normal judgment and insight. Alert and oriented x 3. Normal mood and affect.     Labs on Admission: I have personally reviewed following labs and imaging studies  CBC:  Recent Labs Lab 02/13/16 1320  WBC 7.5  NEUTROABS 6.3  HGB 12.7*  HCT 38.5*  MCV 87.1  PLT 128*   Basic Metabolic Panel:  Recent Labs Lab 02/13/16 1320  NA 136  K 3.4*  CL 104  CO2 25  GLUCOSE 157*  BUN 21*  CREATININE 1.08  CALCIUM 8.2*   GFR: Estimated Creatinine Clearance: 76.1 mL/min (by C-G formula based on Cr of 1.08). Liver Function Tests:  Recent Labs Lab 02/13/16 1320  AST 242*  ALT 263*  ALKPHOS 192*  BILITOT 2.1*  PROT 5.9*  ALBUMIN 3.4*    Recent Labs Lab 02/13/16 1320  LIPASE 16   No results for input(s): AMMONIA in the last 168 hours. Coagulation Profile:  Recent Labs Lab 02/13/16 1320  INR 1.18   Cardiac Enzymes:  Recent Labs Lab 02/13/16 1320  TROPONINI <0.03   BNP (last 3 results) No results for input(s): PROBNP in the last 8760 hours. HbA1C: No results for input(s): HGBA1C in the last 72 hours. CBG: No results for input(s): GLUCAP in the last 168 hours. Lipid Profile: No results for input(s): CHOL, HDL, LDLCALC, TRIG, CHOLHDL, LDLDIRECT in the last 72 hours. Thyroid Function Tests: No results for input(s): TSH, T4TOTAL, FREET4,  T3FREE, THYROIDAB in the  last 72 hours. Anemia Panel: No results for input(s): VITAMINB12, FOLATE, FERRITIN, TIBC, IRON, RETICCTPCT in the last 72 hours. Urine analysis:    Component Value Date/Time   COLORURINE YELLOW 02/13/2016 1330   APPEARANCEUR CLEAR 02/13/2016 1330   LABSPEC 1.010 02/13/2016 1330   PHURINE 6.0 02/13/2016 1330   GLUCOSEU NEGATIVE 02/13/2016 1330   HGBUR NEGATIVE 02/13/2016 1330   BILIRUBINUR MODERATE* 02/13/2016 1330   KETONESUR TRACE* 02/13/2016 1330   PROTEINUR 30* 02/13/2016 1330   UROBILINOGEN 0.2 08/09/2013 1012   NITRITE NEGATIVE 02/13/2016 1330   LEUKOCYTESUR NEGATIVE 02/13/2016 1330   Sepsis Labs: @LABRCNTIP (procalcitonin:4,lacticidven:4) )No results found for this or any previous visit (from the past 240 hour(s)).   Radiological Exams on Admission: US Abdomen Complete  02/13/2016  CLINICAL DATA:  Acute generalized abdominal pain. EXAM: ABDOMEN ULTRASOUND COMPLETE COMPARISON:  CT scan of March 06, 2013. FINDINGS: Gallbladder: Multiple gallstones are noted with minimal gallbladder wall thickening measured at 4 mm, but no pericholecystic fluid or sonographic Murphy's sign is noted. The largest calculus measures 2.6 cm in size. Common bile duct: Diameter: 5 mm which is within normal limits. Liver: No focal lesion identified. Within normal limits in parenchymal echogenicity. IVC: No abnormality visualized. Pancreas: Not visualized due to overlying bowel gas. Spleen: Size and appearance within normal limits. Right Kidney: Length: 12.8 cm. Echogenicity within normal limits. No mass or hydronephrosis visualized. Left Kidney: Length: 11.7 cm. Echogenicity within normal limits. No mass or hydronephrosis visualized. Abdominal aorta: No aneurysm visualized. Other findings: None. IMPRESSION: Cholelithiasis is noted with minimal gallbladder wall thickening, but no pericholecystic fluid or sonographic Murphy's sign is noted. HIDA scan may be performed for further evaluation.  Pancreas is not visualized due to overlying bowel gas. No other abnormality seen in the abdomen. Electronically Signed   By: Lupita Raider, M.D.   On: 02/13/2016 14:01    EKG: Independently reviewed. Sinus, 1st degree AV block, QTc 647 ms  Assessment/Plan  1. Biliary colic, elevated LFTs  - [place] with cholelithiasis, minimal wall-thickening, no pericholecystic fluid, no Murphy's sign  - GI and general surgery on the case and much appreciated  - NPO for now; IVF hydration  - Repeat chem panel in am  - Pain-control prn    2. Hypokalemia  - Serum potassium 3.4 on admission  - Likely secondary to Lasix use  - Replacing with 30 mEq IV  - Check mag level and replete prn    3. Iron-deficiency anemia, thrombocytopenia - Hgb 12.7 on admission, platelet count 128,000  - Hgb level consistent with prior measurements  - Thrombocytopenia is new and of unknown origin  - No sign of active blood-loss - Monitor   4. Type II DM  - Managed with Tradjenta, Invokana, and Amaryl at home  - A1c was 6.5 % in 2011, most recent on file  - Hold home agents while in hospital  - Check CBG q4h, switch to with meals and qHS once eating  - Moderate-intensity SSI prn  - Update A1c, ordered   5. Hypertension - At goal currently  - Managed with Coreg at home  - Continue Coreg at current dose    6. Chronic combined systolic/diastolic CHF - TTE (09/32/67) with EF 25-30%, grade 2 diastolic dysfunction, severe LAE, mild MR - Managed at home with Lasix, Aldactone, and Coreg - Appears dehydrated on admission  - Received 1 L NS in ED, continuing with IVF hydration  - Follow strict I/Os, daily wts - Resume diuretics when appropriate  7. Prolonged QTc, 1st degree AV block  - Suspected secondary to hypokalemia, antiemetics  - Replacing potassium and checking magnesium level as above  - Monitor on continuous telemetry  - Avoid offending agents as possible   8. CAD - No anginal complaints  - EKG without  evidence of acute ischemia - Trop <0.03  - Managed with ASA, Plavix, Lipitor, Coreg at home  - Holding ASA and Plavix given possible need for surgical services    DVT prophylaxis: sq heparin  Code Status: Full Family Communication: Daughters at bedside  Disposition Plan: Admit to telemetry  Consults called: GI, general surgery   Admission status: Inpatient    Briscoe Deutscher, MD [facility] Pager [phone]  If 7PM-7AM, please contact night-coverage [email or web address] Password [email or web address]  02/13/2016, 5:12 PM

## 2016-02-13 NOTE — ED Notes (Signed)
Complain of generalized weakness. States he was here last night for same. States he was to have an [place] today but he was to weak. Denies pain or other symptoms

## 2016-02-13 NOTE — ED Provider Notes (Addendum)
CSN: [id number]     Arrival date & time 02/13/16  1141 History  By signing my name below, I, Placido Sou, attest that this documentation has been prepared under the direction and in the presence of Cathren Laine, MD. Electronically Signed: Placido Sou, ED Scribe. 02/13/2016. 1:05 PM.   Chief Complaint  Patient presents with  . Fatigue   The history is provided by the patient and a relative. No language interpreter was used.    HPI Comments: [name] is a 70 y.o. male who presents to the Emergency Department complaining of generalized weakness x 1 day. Pt was seen last night for upper abd pain and d/c in stable condition and instructed to have an [place] of his gallbladder performed this morning which he denies having performed due to his current state. He reports associated chills, nausea and decreased appetite. His daughter states he has not eaten for nearly 2 days. Pt notes his abd pain has mostly alleviated. His daughter states that he experienced gall bladder issues 2 years ago and was treated with abx. He reports that a cholecystectomy was discussed but was not an option at the time due to his cardiac history. Pt reports partial right sided paralysis due to a stroke further noting he is able to ambulate with difficulty and has very limited use of his RUE. He denies any recent changes in medications. Pt denies fevers, diaphoresis, vomiting, diarrhea, CP, acute leg swelling and SOB.      Past Medical History  Diagnosis Date  . Sinus bradycardia   . Tobacco abuse   . Chronic renal insufficiency   . Osteoarthritis   . Diabetes mellitus   . Mitral regurgitation   . Systolic CHF, chronic (HCC)     Previously taken off lisinopril by primary doctor due to labs  . Carotid stenosis     a. Prior carotid dz/ surgery. b. Carotid dopp 07/2011 - 0-39% bilaterally.;   c. Doppler 11/13: 40-59% RICA, 0-39% LICA  . Cardiomyopathy, ischemic     a. Chronic systolic CHF s/p St. Jude dual chamber  ICD 03/2008.  [name] Hx of CABG   . CAD (coronary artery disease)     a. STEMI s/p CABG 07/2007 (had preop cardiogenic shock, IABP, VDRF before surgery)  . Stroke [facility])     a. CVA 1990s - chronic pain in RUE after stroke.  [name] PAF (paroxysmal atrial fibrillation) (HCC)     a. Poor Coumadin candidate due to hx of GI bleed.  . GI bleed     Reported history  . Splenomegaly   . Myocardial infarction (HCC)   . Automatic implantable cardioverter-defibrillator in situ   . Hypertension     [facility]   . Sleep apnea     study done, 10 yrs ago, unable to tolerate CPAP  . Neuromuscular disorder (HCC)     R sided weakness   . Anemia     Multifactorial  - hx of B12 def and iron deficiency, [facility] heme[facility]   Past Surgical History  Procedure Laterality Date  . Coronary artery bypass graft    . Percutaneous coronary intervention  01/10/2005    Charlies Constable, MD  . Coronary artery bypass graft  08/29/2007    x3; Salvatore Decent. Cornelius Moras MD  . Cardiac defibrillator placement  04/05/2008    Implantation of a St. Jude dual-chamber defibrillator, Doylene Canning. Ladona Ridgel , MD  . Cardiac defibrillator placement    . Tonsillectomy    .  Joint replacement  1990's    L knee  . Total knee arthroplasty Right 08/16/2013    Procedure: RIGHT TOTAL KNEE ARTHROPLASTY;  Surgeon: Kathryne Hitch, MD;  [facility]: [facility];  Service: Orthopedics;  Laterality: Right;  . Ep implantable device N/A 10/05/2015    Procedure: ICD Generator Changeout;  Surgeon: Marinus Maw, MD;  Location: [facility] INVASIVE CV LAB;  Service: Cardiovascular;  Laterality: N/A;   Family History  Problem Relation Age of Onset  . Heart attack Mother     CVA, MI  . Diabetes Mother   . Heart attack Father     MI  . Diabetes Sister   . Coronary artery disease Brother     CABG   Social History  Substance Use Topics  . Smoking status: Former Smoker -- 1.00 packs/day for 15 years    Quit date: 09/29/1990  . Smokeless  tobacco: Former Neurosurgeon    Quit date: 08/09/1988  . Alcohol Use: No    Review of Systems  Constitutional: Positive for chills, appetite change and fatigue. Negative for fever and diaphoresis.  Respiratory: Negative for cough and shortness of breath.   Cardiovascular: Positive for leg swelling (chronic). Negative for chest pain.  Gastrointestinal: Positive for nausea and abdominal pain. Negative for vomiting, diarrhea and constipation.  Endocrine: Negative for polyuria.  Genitourinary: Negative for dysuria.  Neurological: Positive for weakness.  All other systems reviewed and are negative.   Allergies  Codeine  Home Medications   Prior to Admission medications   Medication Sig Start Date End Date Taking? Authorizing Provider  aspirin EC 81 MG EC tablet Take 1 tablet (81 mg total) by mouth daily. 07/29/12   Roger A Arguello, PA-C  atorvastatin (LIPITOR) 80 MG tablet Take 0.5 tablets (40 mg total) by mouth every other day. 07/20/14   Marinus Maw, MD  baclofen (LIORESAL) 20 MG tablet Take 40 mg by mouth at bedtime.     Historical Provider, MD  BISACODYL PO Take 1 tablet by mouth daily as needed (constipation).  12/17/13   Historical Provider, MD  calcium carbonate (TUMS - DOSED IN MG ELEMENTAL CALCIUM) 500 MG chewable tablet Chew 1 tablet by mouth 3 (three) times daily as needed for indigestion or heartburn.  02/14/14   Historical Provider, MD  Canagliflozin (INVOKANA) 300 MG TABS Take 1 tablet by mouth daily.    Historical Provider, MD  carvedilol (COREG) 12.5 MG tablet Take 1 tablet (12.5 mg total) by mouth 2 (two) times daily with a meal. 11/03/14   Laqueta Linden, MD  clopidogrel (PLAVIX) 75 MG tablet TAKE 1 TABLET EVERY DAY 01/31/16   Marinus Maw, MD  furosemide (LASIX) 40 MG tablet Take 40 mg by mouth as directed.    Historical Provider, MD  glimepiride (AMARYL) 4 MG tablet Take 4 mg by mouth 2 (two) times daily.     Historical Provider, MD  linagliptin (TRADJENTA) 5 MG TABS  tablet Take 5 mg by mouth daily after breakfast.     Historical Provider, MD  meclizine (ANTIVERT) 25 MG tablet Take 25 mg by mouth 4 (four) times daily as needed. For dizziness 02/26/12   Historical Provider, MD  metoCLOPramide (REGLAN) 10 MG tablet Take 10 mg by mouth daily as needed for nausea or vomiting.  09/04/15   Historical Provider, MD  nitroGLYCERIN (NITROSTAT) 0.4 MG SL tablet Place 1 tablet (0.4 mg total) under the tongue every 5 (five) minutes as needed for chest pain. 01/02/16   Sharlot Gowda  Myna Hidalgo, MD  oxyCODONE-acetaminophen (PERCOCET) 10-325 MG per tablet Take 1 tablet by mouth every 4 (four) hours as needed for pain.  01/26/14   Historical Provider, MD  RANITIDINE HCL PO Take 1 tablet by mouth daily as needed (heartburn or indigestion).  12/17/13   Historical Provider, MD  SENNA CO Take 1 tablet by mouth daily as needed (constipation).  01/10/14   Historical Provider, MD  spironolactone (ALDACTONE) 25 MG tablet Take 1 tablet (25 mg total) by mouth every morning. 07/20/14   Marinus Maw, MD  triamcinolone cream (KENALOG) 0.5 % Apply 1 application topically daily as needed (for leg lesion).    Historical Provider, MD   BP 102/62 mmHg  Pulse 60  Temp(Src) 98.2 F (36.8 C) (Oral)  Resp 22  Ht 6\' 2"  (1.88 m)  Wt 212 lb (96.163 kg)  BMI 27.21 kg/m2  SpO2 98%    Physical Exam  Constitutional: He is oriented to person, place, and time. He appears well-developed and well-nourished. No distress.  HENT:  Head: Atraumatic.  Eyes: Conjunctivae and EOM are normal. No scleral icterus.  Neck: Normal range of motion. Neck supple.  Cardiovascular: Normal rate, regular rhythm, normal heart sounds and intact distal pulses.  Exam reveals no gallop and no friction rub.   No murmur heard. Pulmonary/Chest: Effort normal and breath sounds normal. No respiratory distress. He has no wheezes. He has no rales. He exhibits no tenderness.  Abdominal: Soft. Bowel sounds are normal. He exhibits no distension  and no mass. There is tenderness. There is no rebound and no guarding.  mild ruq tenderness.   Genitourinary:  No cva tenderness  Musculoskeletal: Normal range of motion. He exhibits edema. He exhibits no tenderness.  Mild bilateral LE edema, chronic per patient.   Neurological: He is alert and oriented to person, place, and time. No cranial nerve deficit.  Chronic right weakness, prior cva.   Skin: Skin is warm and dry. He is not diaphoretic.  Psychiatric: He has a normal mood and affect.  Nursing note and vitals reviewed.   ED Course  Procedures  DIAGNOSTIC STUDIES: Oxygen Saturation is 98% on RA, normal by my interpretation.    COORDINATION OF CARE: 12:43 PM Discussed next steps with pt. He verbalized understanding and is agreeable with the plan.   Results for orders placed or performed during the hospital encounter of 02/13/16  Comprehensive metabolic panel  Result Value Ref Range   Sodium 136 135 - 145 mmol/L   Potassium 3.4 (L) 3.5 - 5.1 mmol/L   Chloride 104 101 - 111 mmol/L   CO2 25 22 - 32 mmol/L   Glucose, Bld 157 (H) 65 - 99 mg/dL   BUN 21 (H) 6 - 20 mg/dL   Creatinine, Ser 7.37 0.61 - 1.24 mg/dL   Calcium 8.2 (L) 8.9 - 10.3 mg/dL   Total Protein 5.9 (L) 6.5 - 8.1 g/dL   Albumin 3.4 (L) 3.5 - 5.0 g/dL   AST 106 (H) 15 - 41 U/L   ALT 263 (H) 17 - 63 U/L   Alkaline Phosphatase 192 (H) 38 - 126 U/L   Total Bilirubin 2.1 (H) 0.3 - 1.2 mg/dL   GFR calc non Af Amer >60 >60 mL/min   GFR calc Af Amer >60 >60 mL/min   Anion gap 7 5 - 15  CBC with Differential/Platelet  Result Value Ref Range   WBC 7.5 4.0 - 10.5 K/uL   RBC 4.42 4.22 - 5.81 MIL/uL   Hemoglobin  12.7 (L) 13.0 - 17.0 g/dL   HCT 21.3 (L) 08.6 - 57.8 %   MCV 87.1 78.0 - 100.0 fL   MCH 28.7 26.0 - 34.0 pg   MCHC 33.0 30.0 - 36.0 g/dL   RDW 46.9 (H) 62.9 - 52.8 %   Platelets 128 (L) 150 - 400 K/uL   Neutrophils Relative % 85 %   Neutro Abs 6.3 1.7 - 7.7 K/uL   Lymphocytes Relative 4 %   Lymphs Abs 0.3  (L) 0.7 - 4.0 K/uL   Monocytes Relative 11 %   Monocytes Absolute 0.9 0.1 - 1.0 K/uL   Eosinophils Relative 0 %   Eosinophils Absolute 0.0 0.0 - 0.7 K/uL   Basophils Relative 0 %   Basophils Absolute 0.0 0.0 - 0.1 K/uL  Lipase, blood  Result Value Ref Range   Lipase 16 11 - 51 U/L  Troponin I  Result Value Ref Range   Troponin I <0.03 <0.031 ng/mL  Protime-INR  Result Value Ref Range   Prothrombin Time 15.1 11.6 - 15.2 seconds   INR 1.18 0.00 - 1.49   US Abdomen Complete  02/13/2016  CLINICAL DATA:  Acute generalized abdominal pain. EXAM: ABDOMEN ULTRASOUND COMPLETE COMPARISON:  CT scan of March 06, 2013. FINDINGS: Gallbladder: Multiple gallstones are noted with minimal gallbladder wall thickening measured at 4 mm, but no pericholecystic fluid or sonographic Murphy's sign is noted. The largest calculus measures 2.6 cm in size. Common bile duct: Diameter: 5 mm which is within normal limits. Liver: No focal lesion identified. Within normal limits in parenchymal echogenicity. IVC: No abnormality visualized. Pancreas: Not visualized due to overlying bowel gas. Spleen: Size and appearance within normal limits. Right Kidney: Length: 12.8 cm. Echogenicity within normal limits. No mass or hydronephrosis visualized. Left Kidney: Length: 11.7 cm. Echogenicity within normal limits. No mass or hydronephrosis visualized. Abdominal aorta: No aneurysm visualized. Other findings: None. IMPRESSION: Cholelithiasis is noted with minimal gallbladder wall thickening, but no pericholecystic fluid or sonographic Murphy's sign is noted. HIDA scan may be performed for further evaluation. Pancreas is not visualized due to overlying bowel gas. No other abnormality seen in the abdomen. Electronically Signed   By: Lupita Raider, M.D.   On: 02/13/2016 14:01    I have personally reviewed and evaluated these images and lab results as part of my medical decision-making.   EKG Interpretation   Date/Time:  Wednesday Feb 13 2016 11:46:35 EDT Ventricular Rate:  61 PR Interval:  220 QRS Duration: 104 QT Interval:  642 QTC Calculation: 647 R Axis:   -61 Text Interpretation:  Sinus rhythm Prolonged PR interval Inferior infarct,  old Anterolateral infarct, age indeterminate Prolonged QT interval QTc  prolongation new, similar TWI in anterolateral leads Confirmed by LIU MD,  DANA [phone]) on 02/13/2016 11:49:42 AM      MDM   I personally performed the services described in this documentation, which was scribed in my presence. The recorded information has been reviewed and considered. Cathren Laine, MD  Iv ns bolus.  U/s results noted. Elevated lfts, new, however lipase normal.    ?cholecystitis, mild gb thickening, elev lfts, and mild ruq tenderness. Zosyn iv.  Will consult general surgery, and anticipate medical service admission.  Discussed w surgeon on call who will see in consult, recommends admit to med service.   Discussed results w pt.   Pain/symptoms improved.   Consulted GI, Dr Karilyn Cota, discussed pt, u/s, lfts, etc, and he recommends admit, conservative tx for now, repeat  labs/lfts in AM, if increasing will consider ERCP then (states decision on when/where to be done depends on repeat lfts, and input from anesthesia), will follow.       Cathren Laine, MD 02/13/16 [phone]

## 2016-02-13 NOTE — Consult Note (Signed)
SURGICAL CONSULTATION NOTE (initial)  HISTORY OF PRESENT ILLNESS (HPI):  70 y.o. male presented with Right upper quadrant abdominal pain and reports of subjective fever/chills, not measured and afebrile since admission. Patient reports the pain started 2 days ago and has improved after he stopped eating, but he his weakness has progressed during the same period. 3 years ago, patient presented for acute cholecystitis, but was managed non-operatively due to high cardiac risk per cardiologist. Patient denies any similar symptoms during the past 3 years. During the interim period, however, patient successfully underwent bilateral knee replacements for severe and disabling osteoarthritis.  PAST MEDICAL HISTORY (PMH):  Past Medical History  Diagnosis Date  . Sinus bradycardia   . Tobacco abuse   . Chronic renal insufficiency   . Osteoarthritis   . Diabetes mellitus   . Mitral regurgitation   . Systolic CHF, chronic (HCC)     Previously taken off lisinopril by primary doctor due to labs  . Carotid stenosis     a. Prior carotid dz/ surgery. b. Carotid dopp 07/2011 - 0-39% bilaterally.;   c. Doppler 11/13: 40-59% RICA, 0-39% LICA  . Cardiomyopathy, ischemic     a. Chronic systolic CHF s/p St. Jude dual chamber ICD 03/2008.  [name] Hx of CABG   . CAD (coronary artery disease)     a. STEMI s/p CABG 07/2007 (had preop cardiogenic shock, IABP, VDRF before surgery)  . Stroke [facility])     a. CVA 1990s - chronic pain in RUE after stroke.  [name] PAF (paroxysmal atrial fibrillation) (HCC)     a. Poor Coumadin candidate due to hx of GI bleed.  . GI bleed     Reported history  . Splenomegaly   . Myocardial infarction (HCC)   . Automatic implantable cardioverter-defibrillator in situ   . Hypertension     [facility]   . Sleep apnea     study done, 10 yrs ago, unable to tolerate CPAP  . Neuromuscular disorder (HCC)     R sided weakness   . Anemia     Multifactorial  - hx of B12 def and iron  deficiency, [facility] heme[facility]     PAST SURGICAL HISTORY [facility]):  Past Surgical History  Procedure Laterality Date  . Coronary artery bypass graft    . Percutaneous coronary intervention  01/10/2005    Charlies Constable, MD  . Coronary artery bypass graft  08/29/2007    x3; Salvatore Decent. Cornelius Moras MD  . Cardiac defibrillator placement  04/05/2008    Implantation of a St. Jude dual-chamber defibrillator, Doylene Canning. Ladona Ridgel , MD  . Cardiac defibrillator placement    . Tonsillectomy    . Joint replacement  1990's    L knee  . Total knee arthroplasty Right 08/16/2013    Procedure: RIGHT TOTAL KNEE ARTHROPLASTY;  Surgeon: Kathryne Hitch, MD;  Location: [facility] OR;  Service: Orthopedics;  Laterality: Right;  . Ep implantable device N/A 10/05/2015    Procedure: ICD Generator Changeout;  Surgeon: Marinus Maw, MD;  Location: [facility] INVASIVE CV LAB;  Service: Cardiovascular;  Laterality: N/A;     MEDICATIONS:  Prior to Admission medications   Medication Sig Start Date End Date Taking? Authorizing Provider  aspirin EC 81 MG EC tablet Take 1 tablet (81 mg total) by mouth daily. 07/29/12  Yes Roger A Arguello, PA-C  atorvastatin (LIPITOR) 80 MG tablet Take 0.5 tablets (40 mg total) by mouth every other day. 07/20/14  Yes  Marinus Maw, MD  baclofen (LIORESAL) 20 MG tablet Take 20 mg by mouth 2 (two) times daily.    Yes Historical Provider, MD  BISACODYL PO Take 1 tablet by mouth daily as needed (constipation).  12/17/13  Yes Historical Provider, MD  calcium carbonate (TUMS - DOSED IN MG ELEMENTAL CALCIUM) 500 MG chewable tablet Chew 1 tablet by mouth 3 (three) times daily as needed for indigestion or heartburn.  02/14/14  Yes Historical Provider, MD  Canagliflozin (INVOKANA) 300 MG TABS Take 1 tablet by mouth daily.   Yes Historical Provider, MD  carvedilol (COREG) 12.5 MG tablet Take 1 tablet (12.5 mg total) by mouth 2 (two) times daily with a meal. 11/03/14  Yes Laqueta Linden,  MD  clopidogrel (PLAVIX) 75 MG tablet TAKE 1 TABLET EVERY DAY 01/31/16  Yes Marinus Maw, MD  furosemide (LASIX) 40 MG tablet Take 40 mg by mouth daily as needed for fluid.    Yes Historical Provider, MD  glimepiride (AMARYL) 4 MG tablet Take 4 mg by mouth 2 (two) times daily.    Yes Historical Provider, MD  linagliptin (TRADJENTA) 5 MG TABS tablet Take 5 mg by mouth daily after breakfast.    Yes Historical Provider, MD  meclizine (ANTIVERT) 25 MG tablet Take 25 mg by mouth 4 (four) times daily as needed. For dizziness 02/26/12  Yes Historical Provider, MD  metoCLOPramide (REGLAN) 10 MG tablet Take 10 mg by mouth daily as needed for nausea or vomiting.  09/04/15  Yes Historical Provider, MD  nitroGLYCERIN (NITROSTAT) 0.4 MG SL tablet Place 1 tablet (0.4 mg total) under the tongue every 5 (five) minutes as needed for chest pain. 01/02/16  Yes Marinus Maw, MD  oxyCODONE-acetaminophen (PERCOCET) 10-325 MG per tablet Take 1 tablet by mouth every 4 (four) hours as needed for pain.  01/26/14  Yes Historical Provider, MD  RANITIDINE HCL PO Take 1 tablet by mouth daily as needed (heartburn or indigestion).  12/17/13  Yes Historical Provider, MD  SENNA CO Take 1 tablet by mouth daily as needed (constipation).  01/10/14  Yes Historical Provider, MD  spironolactone (ALDACTONE) 25 MG tablet Take 1 tablet (25 mg total) by mouth every morning. 07/20/14  Yes Marinus Maw, MD     ALLERGIES:  Allergies  Allergen Reactions  . Codeine Nausea And Vomiting     SOCIAL HISTORY:  Social History   Social History  . Marital Status: Married    Spouse Name: N/A  . Number of Children: N/A  . Years of Education: N/A   Occupational History  . Retired    Social History Main Topics  . Smoking status: Former Smoker -- 1.00 packs/day for 15 years    Quit date: 09/29/1990  . Smokeless tobacco: Former Neurosurgeon    Quit date: 08/09/1988  . Alcohol Use: No  . Drug Use: No  . Sexual Activity: Not on file   Other Topics  Concern  . Not on file   Social History Narrative   Lives in [place] with wife   Been on disability since his stroke in the 1990s   Not routinely exercising    The patient currently resides (home / rehab facility / nursing home): Home with wife and children  The patient normally is (ambulatory / bedbound) : Impaired mobility secondary to stroke-assoc RLE>RUE weakness   FAMILY HISTORY:  Family History  Problem Relation Age of Onset  . Heart attack Mother     CVA, MI  . Diabetes  Mother   . Heart attack Father     MI  . Diabetes Sister   . Coronary artery disease Brother     CABG     REVIEW OF SYSTEMS:  Constitutional: denies weight loss, fever, chills, or sweats  Eyes: denies any other vision changes, history of eye injury  ENT: denies sore throat, hearing problems  Respiratory: denies shortness of breath, wheezing  Cardiovascular: denies chest pain, palpitations  Gastrointestinal: denies N/V, diarrhea with abdominal pain as per HPI (previously severe, since nearly resolved) Musculoskeletal: denies any other joint pains or cramps  Skin: Denies any other rashes or skin discolorations  Neurological: denies any other headache, dizziness, weakness  Psychiatric: denies any other depression, anxiety   All other review of systems were negative   VITAL SIGNS:  Temp:  [97.7 F (36.5 C)-99 F (37.2 C)] 99 F (37.2 C) (05/17 1659) Pulse Rate:  [59-95] 95 (05/17 1659) Resp:  [14-23] 22 (05/17 1659) BP: (97-142)/(51-68) 97/51 mmHg (05/17 1659) SpO2:  [96 %-100 %] 98 % (05/17 1659) Weight:  [96.154 kg (211 lb 15.7 oz)-96.163 kg (212 lb)] 96.154 kg (211 lb 15.7 oz) (05/17 1659)     Height: 6\' 2"  (188 cm) Weight: 96.154 kg (211 lb 15.7 oz) BMI (Calculated): 27.3   INTAKE/OUTPUT:  This shift:    Last 2 shifts: @IOLAST2SHIFTS @   PHYSICAL EXAM:  Constitutional:  -- Normal body habitus  -- Awake, alert, and oriented x3  Eyes:  -- Pupils equally round and reactive to light  --  No scleral icterus  Ear, nose, and throat:  -- No jugular venous distension  Pulmonary:  -- No crackles  -- Equal breath sounds bilaterally  Cardiovascular:  -- S1, S2 present -- Cardiac rhythm irregularly irregular Abdomen:  -- Soft, nondistended, and minimal RUQ tenderness with no guarding/rebound  -- No pulsatile abdominal mass appreciated  Musculoskeletal / Integumentary:  -- Wounds or skin discoloration: None  -- Extremities: RLE > RUE weakness, hands and feet warm, no edema  Neurologic:  -- Motor function: RLE > RUE weakness, LUE and LLE FROM  -- Sensation: Diminished Right-side sensation    Labs:  CBC:  Lab Results  Component Value Date   WBC 7.5 02/13/2016   WBC 6.6 01/01/2016   RBC 4.42 02/13/2016   RBC 4.64 01/01/2016   RBC 4.71 08/18/2007   BMP:  Lab Results  Component Value Date   GLUCOSE 157* 02/13/2016   GLUCOSE 148* 01/01/2016   GLUCOSE 144* 01/06/2013   CHLORIDE 106 01/01/2016   CO2 25 02/13/2016   CO2 28 01/01/2016   BUN 21* 02/13/2016   BUN 19.9 01/01/2016   CREATININE 1.08 02/13/2016   CREATININE 1.3 01/01/2016   CREATININE 1.07 09/25/2015   CALCIUM 8.2* 02/13/2016   CALCIUM 9.0 01/01/2016   Coagulation:  Lab Results  Component Value Date   INR 1.18 02/13/2016   APTT 32 11/18/2013   Cardiac markers:  Lab Results  Component Value Date   CKMB 2.4 02/26/2010     Imaging studies:  IMPRESSION: Cholelithiasis is noted with minimal gallbladder wall thickening, but no pericholecystic fluid or sonographic Murphy's sign is noted. HIDA scan may be performed for further evaluation. Pancreas is not visualized due to overlying bowel gas.  No other abnormality seen in the abdomen.  Assessment/Plan:  70 y.o. male with recurrent symptomatic cholelithiasis and likely passage of a gallstone in the context of somewhat elevated LFT's and the chronology of symptoms (ascending cholangitis less likely though can't be excluded  in context of reported  fever/chills), complicated by pertinent comorbidities including CAD s/p remote history of CABG, ischemic cardiomyopathy with chronic systolic and diastolic CHF, chronic iron deficiency anemia, type 2 diabetes mellitus, and atrial fibrillation not on anticoagulation due to a history of GI bleed and reportedly felt to be at high risk for recurrence.   - NPO for now  - IV fluid hydration   - Follow-up morning LFT's  - Low fat diet when resumes PO  - No indication for urgent operative intervention  - Patient overall remains a poor operative candidate and will require cardiac risk stratification and optimization if elective abdominal surgery is to be considered   - DVT prophylaxis   The above findings and recommendations were discussed with the patient and his family, who's questions were all answered to their expressed satisfaction.   Thank you for the opportunity to participate in the care for this patient.   -- Scherrie Gerlach Earlene Plater, MD : [facility] Surgery Office: [phone]

## 2016-02-14 ENCOUNTER — Encounter (HOSPITAL_COMMUNITY): Payer: Self-pay | Admitting: Gastroenterology

## 2016-02-14 DIAGNOSIS — K8050 Calculus of bile duct without cholangitis or cholecystitis without obstruction: Secondary | ICD-10-CM

## 2016-02-14 DIAGNOSIS — R7989 Other specified abnormal findings of blood chemistry: Secondary | ICD-10-CM

## 2016-02-14 LAB — COMPREHENSIVE METABOLIC PANEL
ALBUMIN: 3 g/dL — AB (ref 3.5–5.0)
ALK PHOS: 152 U/L — AB (ref 38–126)
ALT: 177 U/L — AB (ref 17–63)
ANION GAP: 4 — AB (ref 5–15)
AST: 125 U/L — AB (ref 15–41)
BILIRUBIN TOTAL: 1.6 mg/dL — AB (ref 0.3–1.2)
BUN: 17 mg/dL (ref 6–20)
CALCIUM: 7.9 mg/dL — AB (ref 8.9–10.3)
CO2: 26 mmol/L (ref 22–32)
CREATININE: 1.09 mg/dL (ref 0.61–1.24)
Chloride: 108 mmol/L (ref 101–111)
GFR calc Af Amer: >60 mL/min (ref >60)
GFR calc non Af Amer: >60 mL/min (ref >60)
GLUCOSE: 82 mg/dL (ref 65–99)
Potassium: 3.8 mmol/L (ref 3.5–5.1)
Sodium: 138 mmol/L (ref 135–145)
TOTAL PROTEIN: 5.6 g/dL — AB (ref 6.5–8.1)

## 2016-02-14 LAB — GLUCOSE, CAPILLARY
GLUCOSE-CAPILLARY: 51 mg/dL — AB (ref 65–99)
GLUCOSE-CAPILLARY: 51 mg/dL — AB (ref 65–99)
GLUCOSE-CAPILLARY: 65 mg/dL (ref 65–99)
GLUCOSE-CAPILLARY: 82 mg/dL (ref 65–99)
GLUCOSE-CAPILLARY: 83 mg/dL (ref 65–99)
GLUCOSE-CAPILLARY: 89 mg/dL (ref 65–99)
Glucose-Capillary: 50 mg/dL — ABNORMAL LOW (ref 65–99)
Glucose-Capillary: 67 mg/dL (ref 65–99)
Glucose-Capillary: 90 mg/dL (ref 65–99)
Glucose-Capillary: 71 mg/dL (ref 65–99)

## 2016-02-14 LAB — PROTIME-INR
INR: 1.17 (ref 0.00–1.49)
Prothrombin Time: 15.1 seconds (ref 11.6–15.2)

## 2016-02-14 LAB — CBC WITH DIFFERENTIAL/PLATELET
BASOS ABS: 0 10*3/uL (ref 0.0–0.1)
BASOS PCT: 0 %
EOS ABS: 0 10*3/uL (ref 0.0–0.7)
EOS PCT: 0 %
HCT: 38.2 % — ABNORMAL LOW (ref 39.0–52.0)
Hemoglobin: 12.5 g/dL — ABNORMAL LOW (ref 13.0–17.0)
Lymphocytes Relative: 7 %
Lymphs Abs: 0.3 10*3/uL — ABNORMAL LOW (ref 0.7–4.0)
MCH: 29 pg (ref 26.0–34.0)
MCHC: 32.7 g/dL (ref 30.0–36.0)
MCV: 88.6 fL (ref 78.0–100.0)
MONO ABS: 0.7 10*3/uL (ref 0.1–1.0)
Monocytes Relative: 14 %
Neutro Abs: 3.6 10*3/uL (ref 1.7–7.7)
Neutrophils Relative %: 79 %
PLATELETS: 110 10*3/uL — AB (ref 150–400)
RBC: 4.31 MIL/uL (ref 4.22–5.81)
RDW: 18.4 % — AB (ref 11.5–15.5)
WBC: 4.6 10*3/uL (ref 4.0–10.5)

## 2016-02-14 LAB — LIPASE, BLOOD: LIPASE: 16 U/L (ref 11–51)

## 2016-02-14 MED ORDER — DEXTROSE 250 MG/ML IV SOLN
25.0000 mg | Freq: Once | INTRAVENOUS | Status: DC
  Filled 2016-02-14: qty 10

## 2016-02-14 MED ORDER — GLUCAGON HCL RDNA (DIAGNOSTIC) 1 MG IJ SOLR
1.0000 mg | Freq: Once | INTRAMUSCULAR | Status: AC
  Administered 2016-02-14: 1 mg via INTRAVENOUS
  Filled 2016-02-14: qty 1

## 2016-02-14 MED ORDER — DEXTROSE 50 % IV SOLN
INTRAVENOUS | Status: AC
  Administered 2016-02-14: 25 mL via INTRAVENOUS
  Filled 2016-02-14: qty 50

## 2016-02-14 MED ORDER — INSULIN ASPART 100 UNIT/ML ~~LOC~~ SOLN: 0.0000 [IU] | Freq: Four times a day (QID) | SUBCUTANEOUS | Status: DC

## 2016-02-14 MED ORDER — GLUCAGON HCL RDNA (DIAGNOSTIC) 1 MG IJ SOLR: 1.0000 mg | Freq: Once | INTRAMUSCULAR | Status: DC | PRN

## 2016-02-14 MED ORDER — METOCLOPRAMIDE HCL 5 MG/ML IJ SOLN
10.0000 mg | Freq: Four times a day (QID) | INTRAMUSCULAR | Status: DC
  Administered 2016-02-15 – 2016-02-22 (×29): 10 mg via INTRAVENOUS
  Filled 2016-02-14 (×32): qty 2

## 2016-02-14 MED ORDER — DEXTROSE 50 % IV SOLN
50.0000 mL | Freq: Once | INTRAVENOUS | Status: AC
  Administered 2016-02-14: 25 mL via INTRAVENOUS
  Filled 2016-02-14: qty 50

## 2016-02-14 MED ORDER — DEXTROSE 50 % IV SOLN
25.0000 mL | Freq: Once | INTRAVENOUS | Status: AC
  Administered 2016-02-14: 25 mL via INTRAVENOUS
  Filled 2016-02-14: qty 50

## 2016-02-14 MED ORDER — MAGNESIUM SULFATE IN D5W 1-5 GM/100ML-% IV SOLN
1.0000 g | Freq: Once | INTRAVENOUS | Status: AC
  Administered 2016-02-14: 1 g via INTRAVENOUS
  Filled 2016-02-14: qty 100

## 2016-02-14 MED ORDER — DEXTROSE 50 % IV SOLN
25.0000 mL | Freq: Once | INTRAVENOUS | Status: AC
  Administered 2016-02-14: 25 mL via INTRAVENOUS

## 2016-02-14 MED ORDER — DEXTROSE-NACL 5-0.9 % IV SOLN
INTRAVENOUS | Status: DC
Start: 2016-02-14 — End: 2016-02-15
  Administered 2016-02-14 (×2): via INTRAVENOUS

## 2016-02-14 MED ORDER — PIPERACILLIN-TAZOBACTAM 3.375 G IVPB
3.3750 g | Freq: Three times a day (TID) | INTRAVENOUS | Status: DC
  Administered 2016-02-14 – 2016-02-15 (×3): 3.375 g via INTRAVENOUS
  Filled 2016-02-14 (×3): qty 50

## 2016-02-14 MED ORDER — ASPIRIN 81 MG PO CHEW
81.0000 mg | CHEWABLE_TABLET | Freq: Every day | ORAL | Status: DC
  Administered 2016-02-14 – 2016-02-15 (×2): 81 mg via ORAL
  Filled 2016-02-14 (×2): qty 1

## 2016-02-14 NOTE — Progress Notes (Signed)
SURGICAL PROGRESS NOTE   Hospital Day(s): 1.   Post op day(s):  [name]   Interval History: Patient seen and examined, reports experiencing or becoming aware of RUQ/epigastric abdominal pain when he woke this morning, but he was not awaken by pain. Otherwise, patient reports pain has since been improved/tolerable/controlled, denies fever/chills.  Review of Systems:  Constitutional: denies fever, chills  HEENT: denies cough or congestion  Respiratory: denies any shortness of breath  Cardiovascular: denies chest pain or palpitations  Gastrointestinal: abdominal pain as per HPI; denies N/V, diarrhea or constipation  Musculoskeletal: denies pain, decreased motor or sensation  Neurological: denies HA or vision/hearing changes   Vital signs in last 24 hours: [min-max] current  Temp:  [98.1 F (36.7 C)-99 F (37.2 C)] 98.2 F (36.8 C) (05/17 2202) Pulse Rate:  [59-95] 64 (05/17 2202) Resp:  [14-23] 20 (05/17 2202) BP: (97-125)/(49-68) 100/49 mmHg (05/17 2202) SpO2:  [95 %-100 %] 95 % (05/17 2202) Weight:  [96.154 kg (211 lb 15.7 oz)-96.4 kg (212 lb 8.4 oz)] 96.4 kg (212 lb 8.4 oz) (05/18 0500)     Height: 6\' 2"  (188 cm) Weight: 96.4 kg (212 lb 8.4 oz) BMI (Calculated): 27.3   Intake/Output this shift:      Intake/Output last 2 shifts:  @IOLAST2SHIFTS @   Physical Exam:  Constitutional: alert, cooperative and no distress  HENT: normocephalic without obvious abnormality  Eyes: PERRL, EOM's grossly intact and symmetric  Neuro: CN II - XII grossly intact and symmetric without deficit  Respiratory: breathing non-labored at rest  Cardiovascular: regular rate and sinus rhythm  Gastrointestinal: soft, non-tender when examined, and non-distended  Musculoskeletal: UE and LE FROM, no edema or wounds, motor and sensation grossly intact, NT    Labs:  CBC:  Lab Results  Component Value Date   WBC 4.6 02/14/2016   WBC 6.6 01/01/2016   RBC 4.31 02/14/2016   RBC 4.64 01/01/2016   RBC 4.71  08/18/2007   BMP:  Lab Results  Component Value Date   GLUCOSE 82 02/14/2016   GLUCOSE 148* 01/01/2016   GLUCOSE 144* 01/06/2013   CHLORIDE 106 01/01/2016   CO2 26 02/14/2016   CO2 28 01/01/2016   BUN 17 02/14/2016   BUN 19.9 01/01/2016   CREATININE 1.09 02/14/2016   CREATININE 1.3 01/01/2016   CREATININE 1.07 09/25/2015   CALCIUM 7.9* 02/14/2016   CALCIUM 9.0 01/01/2016     Imaging studies: No new pertinent imaging studies   Assessment/Plan:  70 y.o. 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   - Continue NPO, IVF  - Monitor currently decreasing LFT's   - Consider MRCP vs HIDA or ERCP if LFT's plateau or increase or symptoms worsen   - Though patient survived B/L knee replacements, he overall remains a poor operative candidate for Elective abdominal surgery. Elective surgery certainly may be considered, but cardiac risk stratification and optimization should precede elective surgery for this patient following resolution of acute episode/inflammation.  - Medical management of comorbidities as per medical team  -- Scherrie Gerlach. Earlene Plater, MD : [facility] Office: [phone]

## 2016-02-14 NOTE — Progress Notes (Addendum)
PROGRESS NOTE                                                                                                                                                                                                             Patient Demographics:    [name], is a 70 y.o. male, DOB - 25-Jan-1946, VXB:[id number]  Admit date - 02/13/2016   Admitting Physician Briscoe Deutscher, MD  Outpatient Primary MD for the patient is CYNTHIA BUTLER, DO  LOS - 1   Chief Complaint  Patient presents with  . Fatigue       Brief Narrative -   [name] is a 70 y.o. male with medical history significant for CAD status post remote CABG, chronic combined CHF with AICD, iron deficiency anemia, type 2 diabetes mellitus, and paroxysmal atrial fibrillation not anticoagulated due to a GI bleed, TKR 1 yr ago and tolerated it, admitted for recurrent Cholangitis with Gall stones on [place], GI and Surg called.    Subjective:    [name] has, No headache, No chest pain,RUQ abdominal pain - No Nausea, No new weakness tingling or numbness, No Cough - SOB.     Assessment  & Plan :     1. Ascending cholangitis in a patient with history of gallstones. LFTs improving, on empiric antibiotic, GI and surgery following, has AICD. We'll defer further management to GI and surgery. Currently nothing by mouth with IV fluids and antibiotics.  2. 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 Currently compensated and chest pain-free, has AICD, continue beta blocker for secondary prevention and monitor.  3. Prolonged QTC. On beta blocker, provided 1 g of magnesium. Monitor. Avoid Zofran and other QTC prolonging agents. Continue aspirin hold Plavix as he might require further procedures for cholangitis.  4. Dyslipidemia. Statin on hold due to will enjoy to send elevated LFTs.  5. Chronic anemia. Outpatient  follow-up. Monitor.   6. Essential hypertension. For now on leg. Diuretics on hold. Monitor.   7. P.Afib ChadVasc 2 score > 4 - on B blocker, not on anticoagulation due to H/O GIB, on ASA.  8. DM type II. For now place on sliding scale as he is nothing by mouth.  Lab Results  Component Value Date   HGBA1C * 02/27/2010  6.5 (NOTE)                                                                       According to the ADA Clinical Practice Recommendations for 2011, when HbA1c is used as a screening test:   >=6.5%   Diagnostic of Diabetes Mellitus           (if abnormal result  is confirmed)  5.7-6.4%   Increased risk of developing Diabetes Mellitus  References:Diagnosis and Classification of Diabetes Mellitus,Diabetes Care,2011,34(Suppl 1):S62-S69 and Standards of Medical Care in         Diabetes - 2011,Diabetes Care,2011,34  (Suppl 1):S11-S61.   CBG (last 3)   Recent Labs  02/14/16 0502 02/14/16 0535 02/14/16 0809  GLUCAP 51* 89 67      Code Status :  Full code  Family Communication  :  Family bedside  Disposition Plan  : Remain inpatient  Consults  : GI, Surg  Procedures  :    RUQ [place] - Gall stones  DVT Prophylaxis  :    Heparin    Lab Results  Component Value Date   PLT 110* 02/14/2016    Inpatient Medications  Scheduled Meds: . baclofen  20 mg Oral BID  . carvedilol  12.5 mg Oral BID WC  . ciprofloxacin-dexamethasone  3 drop Left Ear BID  . heparin  5,000 Units Subcutaneous Q8H  . insulin aspart  0-9 Units Subcutaneous Q6H  . piperacillin-tazobactam (ZOSYN)  IV  3.375 g Intravenous Q8H  . pneumococcal 23 valent vaccine  0.5 mL Intramuscular Tomorrow-1000   Continuous Infusions:  PRN Meds:.acetaminophen **OR** [DISCONTINUED] acetaminophen, HYDROmorphone (DILAUDID) injection, nitroGLYCERIN, oxyCODONE-acetaminophen **AND** oxyCODONE  Antibiotics  :    Anti-infectives    Start     Dose/Rate Route Frequency Ordered Stop   02/14/16 0930   piperacillin-tazobactam (ZOSYN) IVPB 3.375 g     3.375 g 12.5 mL/hr over 240 Minutes Intravenous Every 8 hours 02/14/16 0922     02/13/16 1430  piperacillin-tazobactam (ZOSYN) IVPB 3.375 g     3.375 g 100 mL/hr over 30 Minutes Intravenous  Once 02/13/16 1417 02/13/16 1457         Objective:   Filed Vitals:   02/13/16 1659 02/13/16 2030 02/13/16 2202 02/14/16 0500  BP: 97/51  100/49   Pulse: 95  64   Temp: 99 F (37.2 C)  98.2 F (36.8 C)   TempSrc: Oral  Oral   Resp: 22  20   Height:  (1.88 m)     Weight: 96.154 kg (211 lb 15.7 oz)   96.4 kg (212 lb 8.4 oz)  SpO2: 98% 96% 95%     Wt Readings from Last 3 Encounters:  02/14/16 96.4 kg (212 lb 8.4 oz)  02/12/16 96.163 kg (212 lb)  01/02/16 95.981 kg (211 lb 9.6 oz)     Intake/Output Summary (Last 24 hours) at 02/14/16 1106 Last data filed at 02/14/16 0600  Gross per 24 hour  Intake 1142.5 ml  Output    350 ml  Net  792.5 ml     Physical Exam  Awake Alert, Oriented X 3, No new F.N deficits, Normal affect [place].AT,PERRAL Supple Neck,No JVD, No cervical lymphadenopathy appriciated.  Symmetrical Chest wall  movement, Good air movement bilaterally, CTAB RRR,No Gallops,Rubs or new Murmurs, No Parasternal Heave +ve B.Sounds, Abd Soft, No tenderness, No organomegaly appriciated, No rebound - guarding or rigidity. No Cyanosis, Clubbing or edema, No new Rash or bruise       Data Review:    CBC  Recent Labs Lab 02/13/16 1320 02/14/16 0559  WBC 7.5 4.6  HGB 12.7* 12.5*  HCT 38.5* 38.2*  PLT 128* 110*  MCV 87.1 88.6  MCH 28.7 29.0  MCHC 33.0 32.7  RDW 18.1* 18.4*  LYMPHSABS 0.3* 0.3*  MONOABS 0.9 0.7  EOSABS 0.0 0.0  BASOSABS 0.0 0.0    Chemistries   Recent Labs Lab 02/13/16 1320 02/13/16 1715 02/14/16 0559  NA 136  --  138  K 3.4*  --  3.8  CL 104  --  108  CO2 25  --  26  GLUCOSE 157*  --  82  BUN 21*  --  17  CREATININE 1.08  --  1.09  CALCIUM 8.2*  --  7.9*  MG  --  1.7  --   AST 242*   --  125*  ALT 263*  --  177*  ALKPHOS 192*  --  152*  BILITOT 2.1*  --  1.6*   ------------------------------------------------------------------------------------------------------------------ No results for input(s): CHOL, HDL, LDLCALC, TRIG, CHOLHDL, LDLDIRECT in the last 72 hours.  Lab Results  Component Value Date   HGBA1C * 02/27/2010    6.5 (NOTE)                                                                       According to the ADA Clinical Practice Recommendations for 2011, when HbA1c is used as a screening test:   >=6.5%   Diagnostic of Diabetes Mellitus           (if abnormal result  is confirmed)  5.7-6.4%   Increased risk of developing Diabetes Mellitus  References:Diagnosis and Classification of Diabetes Mellitus,Diabetes Care,2011,34(Suppl 1):S62-S69 and Standards of Medical Care in         Diabetes - 2011,Diabetes Care,2011,34  (Suppl 1):S11-S61.   ------------------------------------------------------------------------------------------------------------------ No results for input(s): TSH, T4TOTAL, T3FREE, THYROIDAB in the last 72 hours.  Invalid input(s): FREET3 ------------------------------------------------------------------------------------------------------------------ No results for input(s): VITAMINB12, FOLATE, FERRITIN, TIBC, IRON, RETICCTPCT in the last 72 hours.  Coagulation profile  Recent Labs Lab 02/13/16 1320  INR 1.18    No results for input(s): DDIMER in the last 72 hours.  Cardiac Enzymes  Recent Labs Lab 02/13/16 1320  TROPONINI <0.03   ------------------------------------------------------------------------------------------------------------------    Component Value Date/Time   BNP 391.0* 02/13/2016 1721    Micro Results No results found for this or any previous visit (from the past 240 hour(s)).  Radiology Reports US Abdomen Complete  02/13/2016  CLINICAL DATA:  Acute generalized abdominal pain. EXAM: ABDOMEN ULTRASOUND  COMPLETE COMPARISON:  CT scan of March 06, 2013. FINDINGS: Gallbladder: Multiple gallstones are noted with minimal gallbladder wall thickening measured at 4 mm, but no pericholecystic fluid or sonographic Murphy's sign is noted. The largest calculus measures 2.6 cm in size. Common bile duct: Diameter: 5 mm which is within normal limits. Liver: No focal lesion identified. Within normal limits in parenchymal echogenicity. IVC: No abnormality visualized. Pancreas: Not  visualized due to overlying bowel gas. Spleen: Size and appearance within normal limits. Right Kidney: Length: 12.8 cm. Echogenicity within normal limits. No mass or hydronephrosis visualized. Left Kidney: Length: 11.7 cm. Echogenicity within normal limits. No mass or hydronephrosis visualized. Abdominal aorta: No aneurysm visualized. Other findings: None. IMPRESSION: Cholelithiasis is noted with minimal gallbladder wall thickening, but no pericholecystic fluid or sonographic Murphy's sign is noted. HIDA scan may be performed for further evaluation. Pancreas is not visualized due to overlying bowel gas. No other abnormality seen in the abdomen. Electronically Signed   By: Lupita Raider, M.D.   On: 02/13/2016 14:01    Time Spent in minutes  30   SINGH,PRASHANT K M.D on 02/14/2016 at 11:06 AM  Between 7am to 7pm - Pager - [phone]  After 7pm go to [email or web address] - password [facility]  [facility] -  Office  [phone]

## 2016-02-14 NOTE — Consult Note (Signed)
Referring Provider: Leroy Sea, MD Primary Care Physician:  [name], DO Primary Gastroenterologist:  Roetta Sessions, MD  Reason for Consultation:  Abnormal LFTs  HPI: [name] is a 70 y.o. male with PMH significant for CAD s/p remote CABG, CHF, CVA, IDA/B12 deficiency, DM, Afib not anticoagulated with h/o remote GI bleed, ICD who presented with generalized weakness, abdominal pain, nausea. Symptoms began several days prior to admission. Similar symptoms in the past attributed to cholecystitis, felt to be poor surgical candidate due to his ischemic cardiomyopathy. Subsequently has undergone knee replacement surgery 2014.   In the ER, u/s of abd showed cholelithiasis with minimal wall thickening, no pericholecystic fluid, and no Murphy sign. CBD 5mm.  Tbili 2.1, AP 192, AST 242, ALT 263, lipase normal. Given dose of Zosyn, Plavix on hold, Heparin started. Patient has been seen by surgery, Dr. Satira Mccallum who did not recommend urgent operative intervention. "Patient overall remains a poor operative candidate and will require cardiac risk stratification and optimization if elective abdominal surgery is to be considered".   Patient states his abdominal pain had improved yesterday but has returned today for past couple of hours. Nausea without vomiting. BM small this morning. No melena, brbpr. No SOB or chest pain.   Prior to Admission medications   Medication Sig Start Date End Date Taking? Authorizing Provider  aspirin EC 81 MG EC tablet Take 1 tablet (81 mg total) by mouth daily. 07/29/12  Yes Roger A Arguello, PA-C  atorvastatin (LIPITOR) 80 MG tablet Take 0.5 tablets (40 mg total) by mouth every other day. 07/20/14  Yes Marinus Maw, MD  baclofen (LIORESAL) 20 MG tablet Take 20 mg by mouth 2 (two) times daily.    Yes Historical Provider, MD  BISACODYL PO Take 1 tablet by mouth daily as needed (constipation).  12/17/13  Yes Historical Provider, MD  calcium carbonate (TUMS -  DOSED IN MG ELEMENTAL CALCIUM) 500 MG chewable tablet Chew 1 tablet by mouth 3 (three) times daily as needed for indigestion or heartburn.  02/14/14  Yes Historical Provider, MD  Canagliflozin (INVOKANA) 300 MG TABS Take 1 tablet by mouth daily.   Yes Historical Provider, MD  carvedilol (COREG) 12.5 MG tablet Take 1 tablet (12.5 mg total) by mouth 2 (two) times daily with a meal. 11/03/14  Yes Laqueta Linden, MD  clopidogrel (PLAVIX) 75 MG tablet TAKE 1 TABLET EVERY DAY 01/31/16  Yes Marinus Maw, MD  furosemide (LASIX) 40 MG tablet Take 40 mg by mouth daily as needed for fluid.    Yes Historical Provider, MD  glimepiride (AMARYL) 4 MG tablet Take 4 mg by mouth 2 (two) times daily.    Yes Historical Provider, MD  linagliptin (TRADJENTA) 5 MG TABS tablet Take 5 mg by mouth daily after breakfast.    Yes Historical Provider, MD  meclizine (ANTIVERT) 25 MG tablet Take 25 mg by mouth 4 (four) times daily as needed. For dizziness 02/26/12  Yes Historical Provider, MD  metoCLOPramide (REGLAN) 10 MG tablet Take 10 mg by mouth daily as needed for nausea or vomiting.  09/04/15  Yes Historical Provider, MD  nitroGLYCERIN (NITROSTAT) 0.4 MG SL tablet Place 1 tablet (0.4 mg total) under the tongue every 5 (five) minutes as needed for chest pain. 01/02/16  Yes Marinus Maw, MD  oxyCODONE-acetaminophen (PERCOCET) 10-325 MG per tablet Take 1 tablet by mouth every 4 (four) hours as needed for pain.  01/26/14  Yes Historical Provider, MD  RANITIDINE HCL  PO Take 1 tablet by mouth daily as needed (heartburn or indigestion).  12/17/13  Yes Historical Provider, MD  SENNA CO Take 1 tablet by mouth daily as needed (constipation).  01/10/14  Yes Historical Provider, MD  spironolactone (ALDACTONE) 25 MG tablet Take 1 tablet (25 mg total) by mouth every morning. 07/20/14  Yes Marinus Maw, MD    Current Facility-Administered Medications  Medication Dose Route Frequency Provider Last Rate Last Dose  . 0.9 %  sodium chloride  infusion  250 mL Intravenous PRN Briscoe Deutscher, MD      . acetaminophen (TYLENOL) tablet 650 mg  650 mg Oral Q6H PRN Briscoe Deutscher, MD       Or  . acetaminophen (TYLENOL) suppository 650 mg  650 mg Rectal Q6H PRN Briscoe Deutscher, MD      . atorvastatin (LIPITOR) tablet 40 mg  40 mg Oral QODAY Lavone Neri Opyd, MD   40 mg at 02/13/16 1800  . baclofen (LIORESAL) tablet 20 mg  20 mg Oral BID Briscoe Deutscher, MD   20 mg at 02/13/16 2303  . carvedilol (COREG) tablet 12.5 mg  12.5 mg Oral BID WC Lavone Neri Opyd, MD   12.5 mg at 02/13/16 1730  . ciprofloxacin-dexamethasone (CIPRODEX) 0.3-0.1 % otic suspension 3 drop  3 drop Left Ear BID Briscoe Deutscher, MD   3 drop at 02/14/16 0018  . dextrose 5 %-0.9 % sodium chloride infusion   Intravenous Continuous Leda Gauze, NP 150 mL/hr at 02/14/16 0458    . heparin injection 5,000 Units  5,000 Units Subcutaneous Q8H Briscoe Deutscher, MD   5,000 Units at 02/14/16 [id number]  . HYDROmorphone (DILAUDID) injection 1 mg  1 mg Intravenous Q3H PRN Briscoe Deutscher, MD   1 mg at 02/14/16 0458  . insulin aspart (novoLOG) injection 0-9 Units  0-9 Units Subcutaneous Q4H Briscoe Deutscher, MD   1 Units at 02/13/16 1835  . nitroGLYCERIN (NITROSTAT) SL tablet 0.4 mg  0.4 mg Sublingual Q5 min PRN Briscoe Deutscher, MD      . oxyCODONE-acetaminophen (PERCOCET/ROXICET) 5-325 MG per tablet 1 tablet  1 tablet Oral Q4H PRN Briscoe Deutscher, MD       And  . oxyCODONE (Oxy IR/ROXICODONE) immediate release tablet 5 mg  5 mg Oral Q4H PRN Briscoe Deutscher, MD      . pneumococcal 23 valent vaccine (PNU-IMMUNE) injection 0.5 mL  0.5 mL Intramuscular Tomorrow-1000 Lavone Neri Opyd, MD      . sodium chloride flush (NS) 0.9 % injection 3 mL  3 mL Intravenous Q12H Briscoe Deutscher, MD   3 mL at 02/13/16 2309  . sodium chloride flush (NS) 0.9 % injection 3 mL  3 mL Intravenous PRN Lavone Neri Opyd, MD      . sodium chloride flush (NS) 0.9 % injection 3 mL  3 mL Intravenous Q12H Briscoe Deutscher, MD   3 mL at  02/13/16 2309    Allergies as of 02/13/2016 - Review Complete 02/13/2016  Allergen Reaction Noted  . Codeine Nausea And Vomiting 01/02/2015    Past Medical History  Diagnosis Date  . Sinus bradycardia   . Tobacco abuse   . Chronic renal insufficiency   . Osteoarthritis   . Diabetes mellitus   . Mitral regurgitation   . Systolic CHF, chronic (HCC)     Previously taken off lisinopril by primary doctor due to labs  . Carotid stenosis  a. Prior carotid dz/ surgery. b. Carotid dopp 07/2011 - 0-39% bilaterally.;   c. Doppler 11/13: 40-59% RICA, 0-39% LICA  . Cardiomyopathy, ischemic     a. Chronic systolic CHF s/p St. Jude dual chamber ICD 03/2008.  [name] Hx of CABG   . CAD (coronary artery disease)     a. STEMI s/p CABG 07/2007 (had preop cardiogenic shock, IABP, VDRF before surgery)  . Stroke [facility])     a. CVA 1990s - chronic pain in RUE after stroke.  [name] PAF (paroxysmal atrial fibrillation) (HCC)     a. Poor Coumadin candidate due to hx of GI bleed.  . GI bleed     Reported history  . Splenomegaly   . Myocardial infarction (HCC)   . Automatic implantable cardioverter-defibrillator in situ   . Hypertension     [facility]   . Sleep apnea     study done, 10 yrs ago, unable to tolerate CPAP  . Neuromuscular disorder (HCC)     R sided weakness   . Anemia     Multifactorial  - hx of B12 def and iron deficiency, [facility] heme[facility]    Past Surgical History  Procedure Laterality Date  . Coronary artery bypass graft    . Percutaneous coronary intervention  01/10/2005    Charlies Constable, MD  . Coronary artery bypass graft  08/29/2007    x3; Salvatore Decent. Cornelius Moras MD  . Cardiac defibrillator placement  04/05/2008    Implantation of a St. Jude dual-chamber defibrillator, Doylene Canning. Ladona Ridgel , MD  . Cardiac defibrillator placement    . Tonsillectomy    . Joint replacement  1990's    L knee  . Total knee arthroplasty Right 08/16/2013    Procedure: RIGHT  TOTAL KNEE ARTHROPLASTY;  Surgeon: Kathryne Hitch, MD;  Location[facility] OR;  Service: Orthopedics;  Laterality: Right;  . Ep implantable device N/A 10/05/2015    Procedure: ICD Generator Changeout;  Surgeon: Marinus Maw, MD;  Location: [facility] INVASIVE CV LAB;  Service: Cardiovascular;  Laterality: N/A;    Family History  Problem Relation Age of Onset  . Heart attack Mother     CVA, MI  . Diabetes Mother   . Heart attack Father     MI  . Diabetes Sister   . Coronary artery disease Brother     CABG    Social History   Social History  . Marital Status: Married    Spouse Name: N/A  . Number of Children: N/A  . Years of Education: N/A   Occupational History  . Retired    Social History Main Topics  . Smoking status: Former Smoker -- 1.00 packs/day for 15 years    Quit date: 09/29/1990  . Smokeless tobacco: Former Neurosurgeon    Quit date: 08/09/1988  . Alcohol Use: No  . Drug Use: No  . Sexual Activity: Not on file   Other Topics Concern  . Not on file   Social History Narrative   Lives in [place] with wife   Been on disability since his stroke in the 1990s   Not routinely exercising     ROS:  General: Negative for anorexia, weight loss, fever, chills, fatigue, weakness. Eyes: Negative for vision changes.  ENT: Negative for hoarseness, difficulty swallowing , nasal congestion. CV: Negative for chest pain, angina, palpitations, dyspnea on exertion, peripheral edema.  Respiratory: Negative for dyspnea at rest, dyspnea on exertion, cough, sputum, wheezing.  GI:  See history of present illness. GU:  Negative for dysuria, hematuria, urinary incontinence, urinary frequency, nocturnal urination.  MS: Negative for joint pain, low back pain.  Derm: Negative for rash or itching.  Neuro: Negative for weakness, abnormal sensation, seizure, frequent headaches, memory loss, confusion.  Psych: Negative for anxiety, depression, suicidal ideation, hallucinations.  Endo: Negative for  unusual weight change.  Heme: Negative for bruising or bleeding. Allergy: Negative for rash or hives.       Physical Examination: Vital signs in last 24 hours: Temp:  [98.1 F (36.7 C)-99 F (37.2 C)] 98.2 F (36.8 C) (05/17 2202) Pulse Rate:  [59-95] 64 (05/17 2202) Resp:  [14-23] 20 (05/17 2202) BP: (97-125)/(49-68) 100/49 mmHg (05/17 2202) SpO2:  [95 %-100 %] 95 % (05/17 2202) Weight:  [211 lb 15.7 oz (96.154 kg)-212 lb 8.4 oz (96.4 kg)] 212 lb 8.4 oz (96.4 kg) (05/18 0500) Last BM Date: 02/11/16  General: appears uncomfortable. Daughters at bedside.   Head: Normocephalic, atraumatic.   Eyes: Conjunctiva pink, no icterus. Mouth: Oropharyngeal mucosa moist and pink , no lesions erythema or exudate. Neck: Supple without thyromegaly, masses, or lymphadenopathy.  Lungs: Clear to auscultation bilaterally.  Heart: Regular rate and rhythm, no murmurs rubs or gallops.  Abdomen: Bowel sounds are normal, moderate epigastric tenderness, nondistended, no hepatosplenomegaly or masses, no abdominal bruits or    hernia , no rebound or guarding.   Rectal: not performed Extremities: No lower extremity edema, clubbing, deformity.  Neuro: Alert and oriented x 4 , grossly normal neurologically.  Skin: Warm and dry, no rash or jaundice.   Psych: Alert and cooperative, normal mood and affect.        Intake/Output from previous day: 05/17 0701 - 05/18 0700 In: 1142.5 [I.V.:1042.5; IV Piggyback:100] Out: 350 [Urine:350] Intake/Output this shift:    Lab Results: CBC  Recent Labs  02/13/16 1320 02/14/16 0559  WBC 7.5 4.6  HGB 12.7* 12.5*  HCT 38.5* 38.2*  MCV 87.1 88.6  PLT 128* 110*   BMET  Recent Labs  02/13/16 1320 02/14/16 0559  NA 136 138  K 3.4* 3.8  CL 104 108  CO2 25 26  GLUCOSE 157* 82  BUN 21* 17  CREATININE 1.08 1.09  CALCIUM 8.2* 7.9*   LFT  Recent Labs  02/13/16 1320 02/14/16 0559  BILITOT 2.1* 1.6*  ALKPHOS 192* 152*  AST 242* 125*  ALT 263* 177*   PROT 5.9* 5.6*  ALBUMIN 3.4* 3.0*    Lipase  Recent Labs  02/13/16 1320  LIPASE 16    PT/INR  Recent Labs  02/13/16 1320  LABPROT 15.1  INR 1.18      Imaging Studies: US Abdomen Complete  02/13/2016  CLINICAL DATA:  Acute generalized abdominal pain. EXAM: ABDOMEN ULTRASOUND COMPLETE COMPARISON:  CT scan of March 06, 2013. FINDINGS: Gallbladder: Multiple gallstones are noted with minimal gallbladder wall thickening measured at 4 mm, but no pericholecystic fluid or sonographic Murphy's sign is noted. The largest calculus measures 2.6 cm in size. Common bile duct: Diameter: 5 mm which is within normal limits. Liver: No focal lesion identified. Within normal limits in parenchymal echogenicity. IVC: No abnormality visualized. Pancreas: Not visualized due to overlying bowel gas. Spleen: Size and appearance within normal limits. Right Kidney: Length: 12.8 cm. Echogenicity within normal limits. No mass or hydronephrosis visualized. Left Kidney: Length: 11.7 cm. Echogenicity within normal limits. No mass or hydronephrosis visualized. Abdominal aorta: No aneurysm visualized. Other findings: None. IMPRESSION: Cholelithiasis is noted with minimal gallbladder  wall thickening, but no pericholecystic fluid or sonographic Murphy's sign is noted. HIDA scan may be performed for further evaluation. Pancreas is not visualized due to overlying bowel gas. No other abnormality seen in the abdomen. Electronically Signed   By: Lupita Raider, M.D.   On: 02/13/2016 14:01  [4 week]   Impression: 70 y/o male with complicated PMH as outline above. Significant cardiac history and has previously felt to be poor operative candidate, although patient did undergo knee replacement in 07/2013. Similar episode back in early 2014 with acute cholecystitis but improved without surgical or invasive intervention. No cholecystitis on current u/s, but numerous gallstones. With bump in LFTs suspect he could have small CBD stone or  could have passed a stone. This morning his LFTs are slightly improved but his pain has returned which would be concerning for persistent CBD stone. He has an implantable cardiac defibrillator, placed originally in 2009 and likely not compatible with MRI, I have discuss with MRI tech this morning.   Plan: 1. Continue supportive care. 2. Add Zosyn. 3. Family to bring in patient's ICD card to determine if MRI compatible. Can also contact patient's cardiologist.  4. Check lipase on today's labs.  5. Keep npo for now.  6. Further recommendations to follow.   We would like to thank you for the opportunity to participate in the care of [name].  Leanna Battles. Dixon Boos [facility] [phone] 5/18/20179:17 AM     LOS: 1 day     Addundum: Reviewed patient's St Jude ICD patient ID card. Provided serial numbers/etc with MRI and awaiting response, likely will know something in the morning but appears that patient will not be able to have MRI. I have also reached out to cardiology for further information. May have to consider other imaging modalities in the interim. To discuss with Dr. Jena Gauss.   Leanna Battles. Dixon Boos [facility] [phone] 5/18/20171:44 PM

## 2016-02-14 NOTE — Progress Notes (Signed)
Hypoglycemic Event  CBG: 50  Treatment: 50% Dextrose  Symptoms: Asymptomatic  Follow-up CBG: Time: 05:15 CBG Result:89  Possible Reasons for Event: Pt has been NPO.  Comments/MD notified: yes    Felecia Jan D

## 2016-02-15 ENCOUNTER — Inpatient Hospital Stay (HOSPITAL_COMMUNITY): Payer: Medicare HMO

## 2016-02-15 DIAGNOSIS — I25810 Atherosclerosis of coronary artery bypass graft(s) without angina pectoris: Secondary | ICD-10-CM

## 2016-02-15 DIAGNOSIS — I480 Paroxysmal atrial fibrillation: Secondary | ICD-10-CM

## 2016-02-15 DIAGNOSIS — Z95810 Presence of automatic (implantable) cardiac defibrillator: Secondary | ICD-10-CM

## 2016-02-15 DIAGNOSIS — I5042 Chronic combined systolic (congestive) and diastolic (congestive) heart failure: Secondary | ICD-10-CM

## 2016-02-15 DIAGNOSIS — E1351 Other specified diabetes mellitus with diabetic peripheral angiopathy without gangrene: Secondary | ICD-10-CM

## 2016-02-15 DIAGNOSIS — I4581 Long QT syndrome: Secondary | ICD-10-CM

## 2016-02-15 DIAGNOSIS — E785 Hyperlipidemia, unspecified: Secondary | ICD-10-CM

## 2016-02-15 DIAGNOSIS — N189 Chronic kidney disease, unspecified: Secondary | ICD-10-CM

## 2016-02-15 DIAGNOSIS — I10 Essential (primary) hypertension: Secondary | ICD-10-CM

## 2016-02-15 DIAGNOSIS — Z01818 Encounter for other preprocedural examination: Secondary | ICD-10-CM

## 2016-02-15 DIAGNOSIS — I519 Heart disease, unspecified: Secondary | ICD-10-CM

## 2016-02-15 LAB — CBC
HCT: 40.2 % (ref 39.0–52.0)
HEMOGLOBIN: 13 g/dL (ref 13.0–17.0)
MCH: 28.6 pg (ref 26.0–34.0)
MCHC: 32.3 g/dL (ref 30.0–36.0)
MCV: 88.5 fL (ref 78.0–100.0)
PLATELETS: 125 10*3/uL — AB (ref 150–400)
RBC: 4.54 MIL/uL (ref 4.22–5.81)
RDW: 18.3 % — ABNORMAL HIGH (ref 11.5–15.5)
WBC: 5.2 10*3/uL (ref 4.0–10.5)

## 2016-02-15 LAB — COMPREHENSIVE METABOLIC PANEL
ALK PHOS: 186 U/L — AB (ref 38–126)
ALT: 155 U/L — AB (ref 17–63)
ANION GAP: 7 (ref 5–15)
AST: 112 U/L — ABNORMAL HIGH (ref 15–41)
Albumin: 3.1 g/dL — ABNORMAL LOW (ref 3.5–5.0)
BILIRUBIN TOTAL: 2.9 mg/dL — AB (ref 0.3–1.2)
BUN: 12 mg/dL (ref 6–20)
CALCIUM: 8 mg/dL — AB (ref 8.9–10.3)
CO2: 24 mmol/L (ref 22–32)
CREATININE: 0.9 mg/dL (ref 0.61–1.24)
Chloride: 106 mmol/L (ref 101–111)
GFR calc non Af Amer: >60 mL/min (ref >60)
Glucose, Bld: 104 mg/dL — ABNORMAL HIGH (ref 65–99)
Potassium: 4 mmol/L (ref 3.5–5.1)
Sodium: 137 mmol/L (ref 135–145)
TOTAL PROTEIN: 5.9 g/dL — AB (ref 6.5–8.1)

## 2016-02-15 LAB — GLUCOSE, CAPILLARY
GLUCOSE-CAPILLARY: 91 mg/dL (ref 65–99)
GLUCOSE-CAPILLARY: 126 mg/dL — AB (ref 65–99)
GLUCOSE-CAPILLARY: 192 mg/dL — AB (ref 65–99)
GLUCOSE-CAPILLARY: 180 mg/dL — AB (ref 65–99)
GLUCOSE-CAPILLARY: 130 mg/dL — AB (ref 65–99)
Glucose-Capillary: 154 mg/dL — ABNORMAL HIGH (ref 65–99)

## 2016-02-15 LAB — BILIRUBIN, DIRECT: BILIRUBIN DIRECT: 2.1 mg/dL — AB (ref 0.1–0.5)

## 2016-02-15 LAB — PROCALCITONIN: Procalcitonin: 0.62 ng/mL

## 2016-02-15 LAB — HEMOGLOBIN A1C
HEMOGLOBIN A1C: 7.3 % — AB (ref 4.8–5.6)
Mean Plasma Glucose: 163 mg/dL

## 2016-02-15 LAB — LIPASE, BLOOD

## 2016-02-15 MED ORDER — IOPAMIDOL (ISOVUE-300) INJECTION 61%
100.0000 mL | Freq: Once | INTRAVENOUS | Status: AC | PRN
  Administered 2016-02-15: 100 mL via INTRAVENOUS

## 2016-02-15 MED ORDER — HYDRALAZINE HCL 20 MG/ML IJ SOLN: 10.0000 mg | Freq: Four times a day (QID) | INTRAMUSCULAR | Status: DC | PRN

## 2016-02-15 MED ORDER — DEXTROSE-NACL 5-0.9 % IV SOLN
INTRAVENOUS | Status: DC
  Administered 2016-02-15 – 2016-02-16 (×2): via INTRAVENOUS

## 2016-02-15 MED ORDER — SODIUM CHLORIDE 0.9 % IV SOLN
1.5000 g | Freq: Four times a day (QID) | INTRAVENOUS | Status: DC
  Administered 2016-02-15 – 2016-02-20 (×19): 1.5 g via INTRAVENOUS
  Filled 2016-02-15 (×33): qty 1.5

## 2016-02-15 MED ORDER — DIATRIZOATE MEGLUMINE & SODIUM 66-10 % PO SOLN
ORAL | Status: AC
  Filled 2016-02-15: qty 30

## 2016-02-15 MED ORDER — METOPROLOL TARTRATE 5 MG/5ML IV SOLN: 5.0000 mg | INTRAVENOUS | Status: DC | PRN

## 2016-02-15 NOTE — Progress Notes (Addendum)
PROGRESS NOTE                                                                                                                                                                                                             Patient Demographics:    [name], is a 70 y.o. male, DOB - 1946/01/02, ZOX:[id number]  Admit date - 02/13/2016   Admitting Physician Briscoe Deutscher, MD  Outpatient Primary MD for the patient is CYNTHIA BUTLER, DO  LOS - 2   Chief Complaint  Patient presents with  . Fatigue       Brief Narrative -   [name] is a 70 y.o. male with medical history significant for CAD status post remote CABG, chronic combined CHF with AICD, iron deficiency anemia, type 2 diabetes mellitus, and paroxysmal atrial fibrillation not anticoagulated due to a GI bleed, TKR 1 yr ago and tolerated it, admitted for recurrent Cholangitis with Gall stones on [place], GI and Surg called.    Subjective:    [name] today has, No headache, No chest pain, +ve RUQ abdominal pain - No Nausea, No new weakness tingling or numbness, No Cough - SOB.     Assessment  & Plan :    1. Ascending cholangitis in a patient with history of gallstones. LFTs improving, on empiric antibiotic Unasyn, GI and surgery following, has AICD. We'll defer further management to GI and surgery (MRCP/ERCP/Cholecystectomy). Currently nothing by mouth with IV fluids and antibiotics.   Addendum was called at 5.35pm by GI Dr Darrick Penna - patient to be transferred to [facility], GI Dr Randa Evens called, will see him when he gets here, ERCP likely 3-4 days later since he was on Plavix before admission.   2. 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 Currently compensated and chest pain-free, has AICD, continue beta blocker for secondary prevention and monitor. Continue aspirin hold Plavix as he might require further procedures for  cholangitis.  He definitely will be a high-risk candidate for surgery, noted general surgery input who requested cardiology evaluation in case patient requires surgery later. We'll request cardiology to evaluate.  3. Prolonged QTC. On beta blocker, provided 1 g of magnesium. Monitor. Avoid Zofran and other QTC prolonging agents.   4. Dyslipidemia. Statin on hold due to will enjoy to send  elevated LFTs.  5. Chronic anemia. Outpatient follow-up. Monitor.   6. Essential hypertension. For now on Coreg. Diuretics on hold. Added as needed IV hydralazine and Lopressor.  7. P.Afib ChadVasc 2 score > 4 - on B blocker, not on anticoagulation due to H/O GIB, on ASA.  8. DM type II. Had episodes of hypoglycemia, since nothing by mouth Will monitor off insulin sliding scale and do every 6 CBGs.  Lab Results  Component Value Date   HGBA1C 7.3* 02/14/2016   CBG (last 3)   Recent Labs  02/14/16 2343 02/15/16 0405 02/15/16 0736  GLUCAP 82 91 126*      Code Status :  Full code  Family Communication  :  Family bedside  Disposition Plan  : Remain inpatient  Consults  : GI, Surg, Cards  Procedures  :    RUQ [place] - Gall stones  DVT Prophylaxis  :    Heparin    Lab Results  Component Value Date   PLT 125* 02/15/2016    Inpatient Medications  Scheduled Meds: . ampicillin-sulbactam (UNASYN) IV  1.5 g Intravenous Q6H  . aspirin  81 mg Oral Daily  . baclofen  20 mg Oral BID  . carvedilol  12.5 mg Oral BID WC  . ciprofloxacin-dexamethasone  3 drop Left Ear BID  . heparin  5,000 Units Subcutaneous Q8H  . metoCLOPramide (REGLAN) injection  10 mg Intravenous Q6H  . pneumococcal 23 valent vaccine  0.5 mL Intramuscular Tomorrow-1000   Continuous Infusions: . dextrose 5 % and 0.9% NaCl     PRN Meds:.acetaminophen **OR** [DISCONTINUED] acetaminophen, HYDROmorphone (DILAUDID) injection, nitroGLYCERIN, oxyCODONE-acetaminophen **AND** oxyCODONE  Antibiotics  :    Anti-infectives     Start     Dose/Rate Route Frequency Ordered Stop   02/15/16 0830  ampicillin-sulbactam (UNASYN) 1.5 g in sodium chloride 0.9 % 50 mL IVPB     1.5 g 100 mL/hr over 30 Minutes Intravenous Every 6 hours 02/15/16 0822     02/14/16 0930  piperacillin-tazobactam (ZOSYN) IVPB 3.375 g  Status:  Discontinued     3.375 g 12.5 mL/hr over 240 Minutes Intravenous Every 8 hours 02/14/16 0922 02/15/16 0822   02/13/16 1430  piperacillin-tazobactam (ZOSYN) IVPB 3.375 g     3.375 g 100 mL/hr over 30 Minutes Intravenous  Once 02/13/16 1417 02/13/16 1457         Objective:   Filed Vitals:   02/14/16 0500 02/14/16 1425 02/14/16 2100 02/15/16 0500  BP:  112/57 125/63 130/66  Pulse:  60 59 63  Temp:  97.7 F (36.5 C) 98.6 F (37 C) 97.6 F (36.4 C)  TempSrc:  Oral Oral Oral  Resp:  18 18 18   Height:      Weight: 96.4 kg (212 lb 8.4 oz) 95.618 kg (210 lb 12.8 oz)  97 kg (213 lb 13.5 oz)  SpO2:  98% 100% 99%    Wt Readings from Last 3 Encounters:  02/15/16 97 kg (213 lb 13.5 oz)  02/12/16 96.163 kg (212 lb)  01/02/16 95.981 kg (211 lb 9.6 oz)     Intake/Output Summary (Last 24 hours) at 02/15/16 0859 Last data filed at 02/15/16 0100  Gross per 24 hour  Intake      0 ml  Output    450 ml  Net   -450 ml     Physical Exam  Awake Alert, Oriented X 3, No new F.N deficits, Normal affect [place].AT,PERRAL Supple Neck,No JVD, No cervical lymphadenopathy appriciated.  Symmetrical Chest  wall movement, Good air movement bilaterally, CTAB RRR,No Gallops,Rubs or new Murmurs, No Parasternal Heave +ve B.Sounds, Abd Soft, No tenderness, No organomegaly appriciated, No rebound - guarding or rigidity. No Cyanosis, Clubbing or edema, No new Rash or bruise       Data Review:    CBC  Recent Labs Lab 02/13/16 1320 02/14/16 0559 02/15/16 0516  WBC 7.5 4.6 5.2  HGB 12.7* 12.5* 13.0  HCT 38.5* 38.2* 40.2  PLT 128* 110* 125*  MCV 87.1 88.6 88.5  MCH 28.7 29.0 28.6  MCHC 33.0 32.7 32.3  RDW 18.1*  18.4* 18.3*  LYMPHSABS 0.3* 0.3*  --   MONOABS 0.9 0.7  --   EOSABS 0.0 0.0  --   BASOSABS 0.0 0.0  --     Chemistries   Recent Labs Lab 02/13/16 1320 02/13/16 1715 02/14/16 0559 02/15/16 0516  NA 136  --  138 137  K 3.4*  --  3.8 4.0  CL 104  --  108 106  CO2 25  --  26 24  GLUCOSE 157*  --  82 104*  BUN 21*  --  17 12  CREATININE 1.08  --  1.09 0.90  CALCIUM 8.2*  --  7.9* 8.0*  MG  --  1.7  --   --   AST 242*  --  125* 112*  ALT 263*  --  177* 155*  ALKPHOS 192*  --  152* 186*  BILITOT 2.1*  --  1.6* 2.9*   ------------------------------------------------------------------------------------------------------------------ No results for input(s): CHOL, HDL, LDLCALC, TRIG, CHOLHDL, LDLDIRECT in the last 72 hours.  Lab Results  Component Value Date   HGBA1C 7.3* 02/14/2016   ------------------------------------------------------------------------------------------------------------------ No results for input(s): TSH, T4TOTAL, T3FREE, THYROIDAB in the last 72 hours.  Invalid input(s): FREET3 ------------------------------------------------------------------------------------------------------------------ No results for input(s): VITAMINB12, FOLATE, FERRITIN, TIBC, IRON, RETICCTPCT in the last 72 hours.  Coagulation profile  Recent Labs Lab 02/13/16 1320 02/14/16 1203  INR 1.18 1.17    No results for input(s): DDIMER in the last 72 hours.  Cardiac Enzymes  Recent Labs Lab 02/13/16 1320  TROPONINI <0.03   ------------------------------------------------------------------------------------------------------------------    Component Value Date/Time   BNP 391.0* 02/13/2016 1721    Micro Results No results found for this or any previous visit (from the past 240 hour(s)).  Radiology Reports US Abdomen Complete  02/13/2016  CLINICAL DATA:  Acute generalized abdominal pain. EXAM: ABDOMEN ULTRASOUND COMPLETE COMPARISON:  CT scan of March 06, 2013. FINDINGS:  Gallbladder: Multiple gallstones are noted with minimal gallbladder wall thickening measured at 4 mm, but no pericholecystic fluid or sonographic Murphy's sign is noted. The largest calculus measures 2.6 cm in size. Common bile duct: Diameter: 5 mm which is within normal limits. Liver: No focal lesion identified. Within normal limits in parenchymal echogenicity. IVC: No abnormality visualized. Pancreas: Not visualized due to overlying bowel gas. Spleen: Size and appearance within normal limits. Right Kidney: Length: 12.8 cm. Echogenicity within normal limits. No mass or hydronephrosis visualized. Left Kidney: Length: 11.7 cm. Echogenicity within normal limits. No mass or hydronephrosis visualized. Abdominal aorta: No aneurysm visualized. Other findings: None. IMPRESSION: Cholelithiasis is noted with minimal gallbladder wall thickening, but no pericholecystic fluid or sonographic Murphy's sign is noted. HIDA scan may be performed for further evaluation. Pancreas is not visualized due to overlying bowel gas. No other abnormality seen in the abdomen. Electronically Signed   By: Lupita Raider, M.D.   On: 02/13/2016 14:01    Time Spent in minutes  30   Leroy Sea M.D on 02/15/2016 at 8:59 AM  Between 7am to 7pm - Pager - [phone]  After 7pm go to [email or web address] - password [facility]  [facility] -  Office  [phone]

## 2016-02-15 NOTE — Care Management Important Message (Signed)
Important Message  Patient Details  Name: [name] MRN: [id number] Date of Birth: 01/20/1946   Medicare Important Message Given:  Yes    Tijah Hane, Chrystine Oiler, RN 02/15/2016, 9:25 AM

## 2016-02-15 NOTE — Progress Notes (Signed)
SURGICAL PROGRESS NOTE   Hospital Day(s): 2.   Post op day(s):  [name]   Interval History: Patient seen and examined, reports RUQ/epigastric pain (previously improved) with +flatus, denies nausea, CP, or SOB.  Review of Systems:  Constitutional: denies fever, chills  HEENT: denies cough or congestion  Respiratory: denies any shortness of breath  Cardiovascular: denies chest pain or palpitations  Gastrointestinal: reports pain as per HPI, denies N/V, diarrhea or constipation  Musculoskeletal: denies pain, decreased motor or sensation  Neurological: denies HA or vision/hearing changes   Vital signs in last 24 hours: [min-max] current  Temp:  [97.6 F (36.4 C)-98.6 F (37 C)] 97.6 F (36.4 C) (05/19 0500) Pulse Rate:  [59-63] 63 (05/19 0500) Resp:  [18] 18 (05/19 0500) BP: (112-130)/(57-66) 130/66 mmHg (05/19 0500) SpO2:  [98 %-100 %] 99 % (05/19 0500) Weight:  [95.618 kg (210 lb 12.8 oz)-97 kg (213 lb 13.5 oz)] 97 kg (213 lb 13.5 oz) (05/19 0500)     Height: 6\' 2"  (188 cm) Weight: 97 kg (213 lb 13.5 oz) BMI (Calculated): 27.3   Intake/Output this shift:      Intake/Output last 2 shifts:  @IOLAST2SHIFTS @   Physical Exam:  Constitutional: alert, cooperative and no distress  HENT: normocephalic without obvious abnormality  Eyes: PERRL, EOM's grossly intact and symmetric  Neuro: CN II - XII grossly intact and symmetric without deficit  Respiratory: breathing non-labored at rest  Cardiovascular: regular rate and sinus rhythm  Gastrointestinal: soft, RUQ/epigastrium tender to palpation with guarding but without rebound, non-distended Musculoskeletal: baseline Right-side weakness, no edema or wounds    Labs:  CBC:  Lab Results  Component Value Date   WBC 5.2 02/15/2016   WBC 6.6 01/01/2016   RBC 4.54 02/15/2016   RBC 4.64 01/01/2016   RBC 4.71 08/18/2007   BMP:  Lab Results  Component Value Date   GLUCOSE 104* 02/15/2016   GLUCOSE 148* 01/01/2016   GLUCOSE 144*  01/06/2013   CHLORIDE 106 01/01/2016   CO2 24 02/15/2016   CO2 28 01/01/2016   BUN 12 02/15/2016   BUN 19.9 01/01/2016   CREATININE 0.90 02/15/2016   CREATININE 1.3 01/01/2016   CREATININE 1.07 09/25/2015   CALCIUM 8.0* 02/15/2016   CALCIUM 9.0 01/01/2016     Imaging studies: No new pertinent imaging studies   Assessment/Plan:  70 y.o. male with recurrent symptomatic cholelithiasis, possible ascending cholangitis, attributed to likely passage of gallstone, complicated by pertinent comorbidities including CAD s/p remote history of CABG, ischemic cardiomyopathy with chronic systolic and diastolic CHF, chronic iron deficiency anemia, type 2 diabetes mellitus, and atrial fibrillation not on anticoagulation due to a history of GI bleed and reportedly felt to be at high risk for recurrence.  - Continue NPO, IVF - LFT's increased today --> duct evaluation indicated (ERCP vs CT, AICD incompatible with MRCP)  - Though patient survived B/L knee replacements, he overall remains a poor operative candidate for Elective abdominal surgery. Elective surgery certainly may be considered, but cardiac risk stratification and optimization should precede elective surgery for this patient following resolution of acute episode/inflammation. - Medical management of comorbidities as per medical team  -- Scherrie Gerlach. Earlene Plater, MD [place]: [facility] General and Vascular Surgery Office: [phone]

## 2016-02-15 NOTE — Consult Note (Signed)
CARDIOLOGY CONSULT NOTE   Patient ID: [name] MRN: [id number] DOB/AGE: Feb 08, 1946 70 y.o.  Admit Date: 02/13/2016 Referring Physician: Lamar Blinks MD Primary Physician: [name], DO Consulting Cardiologist: Prentice Docker MD Primary Cardiologist: Lewayne Bunting MD  Reason for Consultation: Pre-Operative Evaluation   Clinical Summary [name] is a 70 y.o.male with known history of ischemic cardiomyopathy. chronic systolic heart failure NYHA Class II symtpoms, ICD in situ (St. Jude), followed by Dr. Sharrell Ku, history of CAD with coronary artery bypass grafting and atrial fib, not anticoagulation candidate due to GIB.   He presented to the emergency room because of complaints of generalized weakness and intermittent abdominal pain along with nausea. This is usually occurring after eating. He is a history of cholelithiasis per ultrasound, with no pericholecystic fluid and no Murphy sign. He was admitted with cholangitis. He has had surgical consultation by Dr. Earlene Plater who requests a cardiology evaluation due to high risk for surgery. Her surgery, "he overall remains a poor operative candidate for elective abdominal surgery. Elective surgery may certainly be considered for cardiac risk stratification optimizatin should proceed elective surgery for this patient."   On arrival in the emergency room blood pressure 102/62 heart rate 60 afebrile O2 sat 98%. Pertinent labs demonstrated potassium is 3.4 BUN 21 creatinine 1.08. Elevated liver enzymes with AST 242 ALT 263 alkaline phosphatase 192, bilirubin 2.1. No evidence of leukocytosis, no evidence of anemia. Platelets 128,000. Troponin negative. Ultrasound as described. History with IV fluids, Zofran, piperacillin-tazobactam.  Allergies  Allergen Reactions  . Codeine Nausea And Vomiting    Medications Scheduled Medications: . ampicillin-sulbactam (UNASYN) IV  1.5 g Intravenous Q6H  . aspirin  81 mg Oral Daily  . baclofen   20 mg Oral BID  . carvedilol  12.5 mg Oral BID WC  . ciprofloxacin-dexamethasone  3 drop Left Ear BID  . heparin  5,000 Units Subcutaneous Q8H  . metoCLOPramide (REGLAN) injection  10 mg Intravenous Q6H  . pneumococcal 23 valent vaccine  0.5 mL Intramuscular Tomorrow-1000    Infusions: . dextrose 5 % and 0.9% NaCl      PRN Medications: acetaminophen **OR** [DISCONTINUED] acetaminophen, hydrALAZINE, HYDROmorphone (DILAUDID) injection, metoprolol, nitroGLYCERIN, oxyCODONE-acetaminophen **AND** oxyCODONE   Past Medical History  Diagnosis Date  . Sinus bradycardia   . Tobacco abuse   . Chronic renal insufficiency   . Osteoarthritis   . Diabetes mellitus   . Mitral regurgitation   . Systolic CHF, chronic (HCC)     Previously taken off lisinopril by primary doctor due to labs  . Carotid stenosis     a. Prior carotid dz/ surgery. b. Carotid dopp 07/2011 - 0-39% bilaterally.;   c. Doppler 11/13: 40-59% RICA, 0-39% LICA  . Cardiomyopathy, ischemic     a. Chronic systolic CHF s/p St. Jude dual chamber ICD 03/2008.  [name] Hx of CABG   . CAD (coronary artery disease)     a. STEMI s/p CABG 07/2007 (had preop cardiogenic shock, IABP, VDRF before surgery)  . Stroke [facility])     a. CVA 1990s - chronic pain in RUE after stroke.  [name] PAF (paroxysmal atrial fibrillation) (HCC)     a. Poor Coumadin candidate due to hx of GI bleed.  . GI bleed     Reported history  . Splenomegaly   . Myocardial infarction (HCC)   . Automatic implantable cardioverter-defibrillator in situ   . Hypertension     [place] [facility]   . Sleep apnea  study done, 10 yrs ago, unable to tolerate CPAP  . Neuromuscular disorder (HCC)     R sided weakness   . Anemia     Multifactorial  - hx of B12 def and iron deficiency, followed by heme[facility]    Past Surgical History  Procedure Laterality Date  . Coronary artery bypass graft    . Percutaneous coronary intervention  01/10/2005    Charlies Constable, MD  . Coronary artery bypass graft  08/29/2007    x3; Salvatore Decent. Cornelius Moras MD  . Cardiac defibrillator placement  04/05/2008    Implantation of a St. Jude dual-chamber defibrillator, Doylene Canning. Ladona Ridgel , MD  . Cardiac defibrillator placement    . Tonsillectomy    . Joint replacement  1990's    L knee  . Total knee arthroplasty Right 08/16/2013    Procedure: RIGHT TOTAL KNEE ARTHROPLASTY;  Surgeon: Kathryne Hitch, MD;  Location: [facility] OR;  Service: Orthopedics;  Laterality: Right;  . Ep implantable device N/A 10/05/2015    Procedure: ICD Generator Changeout;  Surgeon: Marinus Maw, MD;  Location: [facility] INVASIVE CV LAB;  Service: Cardiovascular;  Laterality: N/A;  . Esophagogastroduodenoscopy  07/2007    Rourk: small hh, atrophic gastric mucosa, poor distensibility of stomach ?extrinsic compression, CT showed mild to moderate splenomegaly. No etiology for IDA.  [name] Colonoscopy  07/2007    Rourk: left-sided diverticulum. TI normal. No etiology for IDA.    Family History  Problem Relation Age of Onset  . Heart attack Mother     CVA, MI  . Diabetes Mother   . Heart attack Father     MI  . Diabetes Sister   . Coronary artery disease Brother     CABG    Social History [name] reports that he quit smoking about 25 years ago. He quit smokeless tobacco use about 27 years ago. Mr. Staver reports that he does not drink alcohol.  Review of Systems Complete review of systems are found to be negative unless outlined in H&P above.  Physical Examination Blood pressure 130/66, pulse 63, temperature 97.6 F (36.4 C), temperature source Oral, resp. rate 18, height 6\' 2"  (1.88 m), weight 213 lb 13.5 oz (97 kg), SpO2 99 %.  Intake/Output Summary (Last 24 hours) at 02/15/16 0913 Last data filed at 02/15/16 0100  Gross per 24 hour  Intake      0 ml  Output    450 ml  Net   -450 ml    Telemetry:Normal sinus rhythm, T-wave flattening  GEN: Ill-appearing but in no acute distress HEENT:  Conjunctiva and lids normal, oropharynx clear with moist mucosa. Neck: Supple, no elevated JVP or carotid bruits, no thyromegaly. Lungs: Clear to auscultation, nonlabored breathing at rest. Scant crackles in the bases Cardiac: Regular rate and rhythm, distant heart sounds no S3 or significant systolic murmur, no pericardial rub. Abdomen: Soft, tender, no hepatomegaly, bowel sounds present, no guarding or rebound. Extremities: 1+ pitting edema, in the ankles bilaterally, distal pulses 2+. Skin: Warm and dry. Musculoskeletal: No kyphosis. ICD pacemaker in the upper right chest. Neuropsychiatric: Alert and oriented x3, affect grossly appropriate.  Prior Cardiac Testing/Procedures 1. Echocardiogram 09/25/2015 Left ventricle: The cavity size was mildly dilated. Systolic  function was severely reduced. The estimated ejection fraction  was in the range of 25% to 30%. Dyskinesis and scarring of the  apicalanteroseptal, anterior, inferior, inferoseptal, and apical  myocardium. Severe hypokinesis of the anteroseptal, anterior, and  anterolateral  myocardium; consistent with infarction in the  distribution of the left anterior descending coronary artery.  Features are consistent with a pseudonormal left ventricular  filling pattern, with concomitant abnormal relaxation and  increased filling pressure (grade 2 diastolic dysfunction).  Acoustic contrast opacification revealed no evidence ofthrombus. - Mitral valve: Calcified annulus. There was mild regurgitation. - Left atrium: The atrium was severely dilated. - Right atrium: The atrium was mildly dilated.   ICD Interrogation  Last evaluated by Dr. Ladona Ridgel on 01/02/2016 in the office.  Post battery change out evaluation 10/22/2015 Normal device function. Thresholds, sensing, and impedances consistent with implant measurements. Device programmed at appropriate safety margins---RV pulse width increased to 0.35ms. Histogram distribution  appropriate for patient and level of activity. No mode switches or ventricular arrhythmias noted. Patient educated about wound care, arm mobility, lifting  restrictions, shock plan  Cardiac Cath 03/2008 CONCLUSIONS:  1. Moderate reduction in left ventricular function, but improved from  the previous acute study prior to revascularization surgery.  2. Continued patency of the internal mammary to the left anterior  descending.  3. Continued patency of saphenous vein graft to the obtuse marginal.  4. Continued patency of saphenous vein graft to the posterior  descending.  5. Scattered disease involving the intermediate and the  atrioventricular circumflex distally with moderate stenosis, but  small-to-moderate-sized vessels.  Nuclear Stress Test: 10/31//2013 Abnormal Lexiscan Myoview with chest pain but no diagnostic electrocardiographic changes. The Scintigraphic results show extensive prior distal anteroseptal, apical and inferior infarcts. No ischemia noted. The gated ejection fraction was 37% and there is apical and inferior akinesis and septal hypokinesis. There is evidence of ventricular enlargement.  Lab Results  Basic Metabolic Panel:  Recent Labs Lab 02/13/16 1320 02/13/16 1715 02/14/16 0559 02/15/16 0516  NA 136  --  138 137  K 3.4*  --  3.8 4.0  CL 104  --  108 106  CO2 25  --  26 24  GLUCOSE 157*  --  82 104*  BUN 21*  --  17 12  CREATININE 1.08  --  1.09 0.90  CALCIUM 8.2*  --  7.9* 8.0*  MG  --  1.7  --   --     Liver Function Tests:  Recent Labs Lab 02/13/16 1320 02/14/16 0559 02/15/16 0516  AST 242* 125* 112*  ALT 263* 177* 155*  ALKPHOS 192* 152* 186*  BILITOT 2.1* 1.6* 2.9*  PROT 5.9* 5.6* 5.9*  ALBUMIN 3.4* 3.0* 3.1*    CBC:  Recent Labs Lab 02/13/16 1320 02/14/16 0559 02/15/16 0516  WBC 7.5 4.6 5.2  NEUTROABS 6.3 3.6  --   HGB 12.7* 12.5* 13.0  HCT 38.5* 38.2* 40.2  MCV 87.1 88.6 88.5  PLT 128* 110* 125*    Cardiac  Enzymes:  Recent Labs Lab 02/13/16 1320  TROPONINI <0.03    Radiology: US Abdomen Complete  02/13/2016  CLINICAL DATA:  Acute generalized abdominal pain. EXAM: ABDOMEN ULTRASOUND COMPLETE COMPARISON:  CT scan of March 06, 2013. FINDINGS: Gallbladder: Multiple gallstones are noted with minimal gallbladder wall thickening measured at 4 mm, but no pericholecystic fluid or sonographic Murphy's sign is noted. The largest calculus measures 2.6 cm in size. Common bile duct: Diameter: 5 mm which is within normal limits. Liver: No focal lesion identified. Within normal limits in parenchymal echogenicity. IVC: No abnormality visualized. Pancreas: Not visualized due to overlying bowel gas. Spleen: Size and appearance within normal limits. Right Kidney: Length: 12.8 cm. Echogenicity within normal limits. No mass or  hydronephrosis visualized. Left Kidney: Length: 11.7 cm. Echogenicity within normal limits. No mass or hydronephrosis visualized. Abdominal aorta: No aneurysm visualized. Other findings: None. IMPRESSION: Cholelithiasis is noted with minimal gallbladder wall thickening, but no pericholecystic fluid or sonographic Murphy's sign is noted. HIDA scan may be performed for further evaluation. Pancreas is not visualized due to overlying bowel gas. No other abnormality seen in the abdomen. Electronically Signed   By: Lupita Raider, M.D.   On: 02/13/2016 14:01     ECG: Normal sinus rhythm rate of 61 bpm with prolonged QT interval greater than 600.   Impression and Recommendations  1. Ischemic cardiopathy: Patient is asymptomatic currently no evidence of substantial fluid retention although he does have some mild bilateral ankle edema. Most recent echocardiogram in 2016 demonstrated ejection fraction of 25% to 30%.Severe hypokinesis of the anteroseptal, anterior, and anterolateral myocardium; consistent with infarction in the distribution of the left anterior descending coronary artery. (grade 2 diastolic  dysfunction). He offers no complaints from cardiac standpoint. He will need to continue diuretics.  We'll continue carvedilol, aspirin, should be on ACE inhibitor. Reviewed labs and allergies without contraindication for starting. BP is stable. Recommend low-dose lisinopril 2.5 mg daily.  2. Acute cholecystitis with cholelithiasis: Overall moderate but acceptable risk to undergo cholecystectomy. Patient is very symptomatic with this diagnosis. We'll need to have perioperative beta blocker and fluid management post surgery to avoid decompensation of CHF and arrhythmias. Patient states surgery may be [facility] at [facility]. He wishes to proceed with this without returning home as he is feeling so ill. We'll defer to surgery for this decision on timing. No further cardiac testing is planned at this time  3. Coronary artery disease: History of coronary artery bypass grafting: He offers no cardiac complaint and is medically compliant with statin therapy, dual antiplatelet therapy, and beta blocker.  4. ICD in situ: New generator placement on 10/05/2015, the IV ICD, St. Jude dual chamber per Dr. Ladona Ridgel. Follow-up interrogation and appointments have been completed with most recent appointment in April 2017. Device functioning normally without ICD shocks. We'll discuss with Dr. Ladona Ridgel if there is a need to temporarily turn off ICD during surgery, with reinterrogation postoperatively.  Signed: Bettey Mare. Lawrence NP [facility]  02/15/2016, 9:13 AM Co-Sign MD  The patient was seen and examined, and I agree with the history, physical exam, assessment and plan as documented above which has been discussed with Harriet Pho NP, with modifications as noted below.  Pt with known ischemic cardiomyopathy and severe LV dysfunction with AICD, followed by Dr. Ladona Ridgel most recently in 12/2015.  Admitted with symptomatic cholelithiasis and possible acute ascending cholangitis with plans for surgery.  Last nuclear MPI study  06/2012 showed extensive myocardial scar without ischemia.  Echo 09/25/15 showed mild LV enlargement, severely reduced LV systolic function, EF 25-30%, grade II diastolic dysfunction, mild mitral regurgitation, and severe left atrial enlargement.  ECG shows old inferior and anterolateral infarct with precordial T wave inversions and prolonged QTc. Q waves and T wave inversions are unchanged from January 2017 ECG.  Takes ASA, Coreg, Lipitor, Plavix, Lasix prn, and Aldactone in outpatient setting. Currently on ASA and Coreg while hospitalized.  Currently denies chest pain, leg swelling, orthopnea, PND, and shortness of breath. No recent SL nitro use in the last several months.  Off diuretics as patient was dehydrated at time of admission.  Physical exam notable for faint bibasilar rales. He has been receiving IV fluid hydration.  I will order a  chest xray.  He is certainly at high risk for major adverse cardiac events in the perioperative period with respect to intraperitoneal surgery. If the plan is for laparoscopic cholecystectomy, then he is at an intermediate risk. Would continue ASA 81 mg (if feasible) and utilize IV metoprolol during surgery to attenuate this risk. LFT elevation precludes statin utilization at present.  Prentice Docker, M.D., F.A.C.C. 02/15/16 10:21 AM

## 2016-02-15 NOTE — Progress Notes (Signed)
Subjective:  Patient still with epigastric pain/ruq pain. No vomiting. +flatus.   Objective: Vital signs in last 24 hours: Temp:  [97.6 F (36.4 C)-98.6 F (37 C)] 97.6 F (36.4 C) (05/19 0500) Pulse Rate:  [59-63] 63 (05/19 0500) Resp:  [18] 18 (05/19 0500) BP: (112-130)/(57-66) 130/66 mmHg (05/19 0500) SpO2:  [98 %-100 %] 99 % (05/19 0500) Weight:  [210 lb 12.8 oz (95.618 kg)-213 lb 13.5 oz (97 kg)] 213 lb 13.5 oz (97 kg) (05/19 0500) Last BM Date: 02/14/16 General:   Alert,  Well-developed, well-nourished, pleasant and cooperative in NAD. Multiple family members at bedside.  Head:  Normocephalic and atraumatic. Eyes:  Sclera clear, no icterus.  Chest: CTA bilaterally without rales, rhonchi, crackles.    Heart:  Regular rate and rhythm; no murmurs, clicks, rubs,  or gallops. Abdomen:  Soft, moderate epigastric tenderness and nondistended. No masses, hepatosplenomegaly or hernias noted. Normal bowel sounds, without guarding, and without rebound.   Extremities:  Without clubbing, deformity or edema. Neurologic:  Alert and  oriented x4;  grossly normal neurologically. Skin:  Intact without significant lesions or rashes. Psych:  Alert and cooperative. Normal mood and affect.  Intake/Output from previous day: 05/18 0701 - 05/19 0700 In: 0  Out: 450 [Urine:450] Intake/Output this shift:    Lab Results: CBC  Recent Labs  02/13/16 1320 02/14/16 0559 02/15/16 0516  WBC 7.5 4.6 5.2  HGB 12.7* 12.5* 13.0  HCT 38.5* 38.2* 40.2  MCV 87.1 88.6 88.5  PLT 128* 110* 125*   BMET  Recent Labs  02/13/16 1320 02/14/16 0559 02/15/16 0516  NA 136 138 137  K 3.4* 3.8 4.0  CL 104 108 106  CO2 GLUCOSE 157* 82 104*  BUN 21* 17 12  CREATININE 1.08 1.09 0.90  CALCIUM 8.2* 7.9* 8.0*   LFTs  Recent Labs  02/13/16 1320 02/14/16 0559 02/15/16 0516  BILITOT 2.1* 1.6* 2.9*  ALKPHOS 192* 152* 186*  AST 242* 125* 112*  ALT 263* 177* 155*  PROT 5.9* 5.6* 5.9*  ALBUMIN  3.4* 3.0* 3.1*    Recent Labs  02/13/16 1320 02/14/16 0559 02/15/16 0516  LIPASE 16 16 <10*   PT/INR  Recent Labs  02/13/16 1320 02/14/16 1203  LABPROT 15.1 15.1  INR 1.18 1.17      Imaging Studies: US Abdomen Complete  02/13/2016  CLINICAL DATA:  Acute generalized abdominal pain. EXAM: ABDOMEN ULTRASOUND COMPLETE COMPARISON:  CT scan of March 06, 2013. FINDINGS: Gallbladder: Multiple gallstones are noted with minimal gallbladder wall thickening measured at 4 mm, but no pericholecystic fluid or sonographic Murphy's sign is noted. The largest calculus measures 2.6 cm in size. Common bile duct: Diameter: 5 mm which is within normal limits. Liver: No focal lesion identified. Within normal limits in parenchymal echogenicity. IVC: No abnormality visualized. Pancreas: Not visualized due to overlying bowel gas. Spleen: Size and appearance within normal limits. Right Kidney: Length: 12.8 cm. Echogenicity within normal limits. No mass or hydronephrosis visualized. Left Kidney: Length: 11.7 cm. Echogenicity within normal limits. No mass or hydronephrosis visualized. Abdominal aorta: No aneurysm visualized. Other findings: None. IMPRESSION: Cholelithiasis is noted with minimal gallbladder wall thickening, but no pericholecystic fluid or sonographic Murphy's sign is noted. HIDA scan may be performed for further evaluation. Pancreas is not visualized due to overlying bowel gas. No other abnormality seen in the abdomen. Electronically Signed   By: Lupita Raider, M.D.   On: 02/13/2016 14:01  [2 weeks]   Assessment: 70  y/o male with complicated PMH as outline above. Significant cardiac history and has previously felt to be poor operative candidate. Similar episode back in early 2014 with acute cholecystitis but improved without surgical or invasive intervention. No cholecystitis on current u/s, but numerous gallstones. With bump in LFTs suspect he could have small CBD stone or could have passed a stone.  LFTs are not significantly changed today. No evidence of biliary pancreatitis. Has remained afebrile on empiric antibiotics. Continues to have persistent constant RUQ pain.  Patient is not a candidate for MRCP based on his ICD. This has been confirmed with radiology. I have personally spoken to the radiologist, Dr. Michaele Offer, who acknowledges that there is no perfect study to exclude CBD stone in this scenario. Noncalcified stones will not show up on CT and if HIDA shows passage of tracer into the duodenum it cannot exclude nonobstructing CBD stone. I have discussed this with patient and family, Dr. Darrick Penna. Plan on CT with contrast.     Plan: 1. Consider increasing IV fluids to improve urine output.  2. CT abd with contrast. 3. Continue supportive measures.   Leanna Battles. Dixon Boos [facility] [phone] 5/19/201710:02 AM     LOS: 2 days

## 2016-02-16 LAB — COMPREHENSIVE METABOLIC PANEL
ALT: 122 U/L — ABNORMAL HIGH (ref 17–63)
ANION GAP: 9 (ref 5–15)
AST: 70 U/L — ABNORMAL HIGH (ref 15–41)
Albumin: 2.6 g/dL — ABNORMAL LOW (ref 3.5–5.0)
Alkaline Phosphatase: 273 U/L — ABNORMAL HIGH (ref 38–126)
BILIRUBIN TOTAL: 3.7 mg/dL — AB (ref 0.3–1.2)
BUN: 7 mg/dL (ref 6–20)
CALCIUM: 8.2 mg/dL — AB (ref 8.9–10.3)
CO2: 24 mmol/L (ref 22–32)
Chloride: 106 mmol/L (ref 101–111)
Creatinine, Ser: 0.89 mg/dL (ref 0.61–1.24)
GFR calc Af Amer: >60 mL/min (ref >60)
GLUCOSE: 177 mg/dL — AB (ref 65–99)
Potassium: 4 mmol/L (ref 3.5–5.1)
Sodium: 139 mmol/L (ref 135–145)
TOTAL PROTEIN: 5.4 g/dL — AB (ref 6.5–8.1)

## 2016-02-16 LAB — GLUCOSE, CAPILLARY
GLUCOSE-CAPILLARY: 163 mg/dL — AB (ref 65–99)
GLUCOSE-CAPILLARY: 164 mg/dL — AB (ref 65–99)
Glucose-Capillary: 180 mg/dL — ABNORMAL HIGH (ref 65–99)
Glucose-Capillary: 187 mg/dL — ABNORMAL HIGH (ref 65–99)
Glucose-Capillary: 160 mg/dL — ABNORMAL HIGH (ref 65–99)

## 2016-02-16 LAB — CBC
HEMATOCRIT: 39.6 % (ref 39.0–52.0)
Hemoglobin: 13 g/dL (ref 13.0–17.0)
MCH: 28 pg (ref 26.0–34.0)
MCHC: 32.8 g/dL (ref 30.0–36.0)
MCV: 85.3 fL (ref 78.0–100.0)
Platelets: 144 10*3/uL — ABNORMAL LOW (ref 150–400)
RBC: 4.64 MIL/uL (ref 4.22–5.81)
RDW: 18 % — AB (ref 11.5–15.5)
WBC: 4.1 10*3/uL (ref 4.0–10.5)

## 2016-02-16 LAB — MAGNESIUM: Magnesium: 2.3 mg/dL (ref 1.7–2.4)

## 2016-02-16 MED ORDER — DEXTROSE-NACL 5-0.9 % IV SOLN
INTRAVENOUS | Status: AC
  Administered 2016-02-16 (×2): via INTRAVENOUS

## 2016-02-16 MED ORDER — LORATADINE 10 MG PO TABS
10.0000 mg | ORAL_TABLET | Freq: Every day | ORAL | Status: DC
  Administered 2016-02-16 – 2016-02-22 (×5): 10 mg via ORAL
  Filled 2016-02-16 (×6): qty 1

## 2016-02-16 MED ORDER — MECLIZINE HCL 25 MG PO TABS: 25.0000 mg | ORAL_TABLET | Freq: Four times a day (QID) | ORAL | Status: DC | PRN

## 2016-02-16 NOTE — Progress Notes (Signed)
[facility] Discussed with Dr Darrick Penna, CT reviewed will discuss with biliary GI timing of ERCP ? Tune up first Full note to follow.

## 2016-02-16 NOTE — Progress Notes (Addendum)
PROGRESS NOTE                                                                                                                                                                                                             Patient Demographics:    [name], is a 70 y.o. male, DOB - June 19, 1946, ZOX:[id number]  Admit date - 02/13/2016   Admitting Physician Briscoe Deutscher, MD  Outpatient Primary MD for the patient is CYNTHIA BUTLER, DO  LOS - 3   Chief Complaint  Patient presents with  . Fatigue       Brief Narrative -   [name] is a 70 y.o. male with medical history significant for CAD status post remote CABG, chronic combined CHF with AICD, iron deficiency anemia, type 2 diabetes mellitus, and paroxysmal atrial fibrillation not anticoagulated due to a GI bleed, TKR 1 yr ago and tolerated it, admitted for recurrent Cholangitis with Gall stones on [place], GI and Surg called.    Subjective:    [name] today has, No headache, No chest pain, +ve RUQ abdominal pain but much improved than before - No Nausea, No new weakness tingling or numbness, No Cough - SOB.     Assessment  & Plan :    1. Ascending cholangitis in a patient with history of gallstones. LFTs improving Question moving stone or stone which has passed CBD, on empiric antibiotic Unasyn, GI and surgery following, has AICD. Currently nothing by mouth with IV fluids and antibiotics.  He was initially admitted to [facility] but on 02/15/2016 transferred to [facility] for further care, either GI following, tentative plans to do ERCP on 02/18/2016. Discussed with Dr. Randa Evens. His pain actually is better on 02/16/2016.  2. 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 Currently compensated and chest pain-free, has AICD, continue beta blocker for secondary prevention and monitor. Last Plavix dose was 02/13/2016 at  home, aspirin now discontinued by GI on 02/16/2016 as he might require further procedures for cholangitis.  He definitely will be a high-risk candidate for surgery, cardiology following during the perioperative period.  3. Prolonged QTC. On beta blocker, provided 1 g of magnesium. Monitor. Avoid Zofran and other QTC prolonging agents.   4. Dyslipidemia. Statin on hold due to will enjoy to send elevated  LFTs.  5. Chronic anemia. Outpatient follow-up. Monitor.   6. Essential hypertension. For now on Coreg. Diuretics on hold. Added as needed IV hydralazine and Lopressor.  7. P.Afib ChadVasc 2 score > 4 - on B blocker, not on anticoagulation due to H/O GIB, resume aspirin post surgery/ERCP.  8. DM type II. Had episodes of hypoglycemia, since nothing by mouth Will monitor off insulin sliding scale and do every 6 CBGs.  Lab Results  Component Value Date   HGBA1C 7.3* 02/14/2016   CBG (last 3)   Recent Labs  02/15/16 2354 02/16/16 0429 02/16/16 0839  GLUCAP 154* 180* 187*      Code Status :  Full code  Family Communication  :  Family bedside  Disposition Plan  : Remain inpatient  Consults  : GI, Surg, Cards  Procedures  :    RUQ [place] - Gall stones  DVT Prophylaxis  :    Heparin    Lab Results  Component Value Date   PLT 144* 02/16/2016    Inpatient Medications  Scheduled Meds: . ampicillin-sulbactam (UNASYN) IV  1.5 g Intravenous Q6H  . baclofen  20 mg Oral BID  . carvedilol  12.5 mg Oral BID WC  . ciprofloxacin-dexamethasone  3 drop Left Ear BID  . heparin  5,000 Units Subcutaneous Q8H  . metoCLOPramide (REGLAN) injection  10 mg Intravenous Q6H  . pneumococcal 23 valent vaccine  0.5 mL Intramuscular Tomorrow-1000   Continuous Infusions: . dextrose 5 % and 0.9% NaCl 75 mL/hr at 02/16/16 0321   PRN Meds:.acetaminophen **OR** [DISCONTINUED] acetaminophen, hydrALAZINE, HYDROmorphone (DILAUDID) injection, metoprolol, nitroGLYCERIN, oxyCODONE-acetaminophen **AND**  oxyCODONE  Antibiotics  :    Anti-infectives    Start     Dose/Rate Route Frequency Ordered Stop   02/15/16 0830  ampicillin-sulbactam (UNASYN) 1.5 g in sodium chloride 0.9 % 50 mL IVPB     1.5 g 100 mL/hr over 30 Minutes Intravenous Every 6 hours 02/15/16 0822     02/14/16 0930  piperacillin-tazobactam (ZOSYN) IVPB 3.375 g  Status:  Discontinued     3.375 g 12.5 mL/hr over 240 Minutes Intravenous Every 8 hours 02/14/16 0922 02/15/16 0822   02/13/16 1430  piperacillin-tazobactam (ZOSYN) IVPB 3.375 g     3.375 g 100 mL/hr over 30 Minutes Intravenous  Once 02/13/16 1417 02/13/16 1457         Objective:   Filed Vitals:   02/15/16 2131 02/15/16 2339 02/16/16 0431 02/16/16 0825  BP: 144/60 123/68 123/60 139/62  Pulse: 87 60 59 60  Temp: 97.7 F (36.5 C) 98.1 F (36.7 C) 98 F (36.7 C)   TempSrc: Oral Oral Oral   Resp: 18 18 18    Height:  6\' 1"  (1.854 m)    Weight:  95.936 kg (211 lb 8 oz)    SpO2: 96% 97% 98% 99%    Wt Readings from Last 3 Encounters:  02/15/16 95.936 kg (211 lb 8 oz)  02/12/16 96.163 kg (212 lb)  01/02/16 95.981 kg (211 lb 9.6 oz)     Intake/Output Summary (Last 24 hours) at 02/16/16 1015 Last data filed at 02/16/16 0431  Gross per 24 hour  Intake    750 ml  Output   1900 ml  Net  -1150 ml     Physical Exam  Awake Alert, Oriented X 3, No new F.N deficits, Normal affect [place].AT,PERRAL Supple Neck,No JVD, No cervical lymphadenopathy appriciated.  Symmetrical Chest wall movement, Good air movement bilaterally, CTAB RRR,No Gallops,Rubs or new Murmurs, No  Parasternal Heave +ve B.Sounds, Abd Soft, No tenderness, No organomegaly appriciated, No rebound - guarding or rigidity. No Cyanosis, Clubbing or edema, No new Rash or bruise       Data Review:    CBC  Recent Labs Lab 02/13/16 1320 02/14/16 0559 02/15/16 0516 02/16/16 0809  WBC 7.5 4.6 5.2 4.1  HGB 12.7* 12.5* 13.0 13.0  HCT 38.5* 38.2* 40.2 39.6  PLT 128* 110* 125* 144*  MCV 87.1  88.6 88.5 85.3  MCH 28.7 29.0 28.6 28.0  MCHC 33.0 32.7 32.3 32.8  RDW 18.1* 18.4* 18.3* 18.0*  LYMPHSABS 0.3* 0.3*  --   --   MONOABS 0.9 0.7  --   --   EOSABS 0.0 0.0  --   --   BASOSABS 0.0 0.0  --   --     Chemistries   Recent Labs Lab 02/13/16 1320 02/13/16 1715 02/14/16 0559 02/15/16 0516 02/16/16 0809  NA 136  --  138 137 139  [name] 3.4*  --  3.8 4.0 4.0  CL 104  --  108 106 106  CO2 25  --  GLUCOSE 157*  --  82 104* 177*  BUN 21*  --  CREATININE 1.08  --  1.09 0.90 0.89  CALCIUM 8.2*  --  7.9* 8.0* 8.2*  MG  --  1.7  --   --  2.3  AST 242*  --  125* 112* 70*  ALT 263*  --  177* 155* 122*  ALKPHOS 192*  --  152* 186* 273*  BILITOT 2.1*  --  1.6* 2.9* 3.7*   ------------------------------------------------------------------------------------------------------------------ No results for input(s): CHOL, HDL, LDLCALC, TRIG, CHOLHDL, LDLDIRECT in the last 72 hours.  Lab Results  Component Value Date   HGBA1C 7.3* 02/14/2016   ------------------------------------------------------------------------------------------------------------------ No results for input(s): TSH, T4TOTAL, T3FREE, THYROIDAB in the last 72 hours.  Invalid input(s): FREET3 ------------------------------------------------------------------------------------------------------------------ No results for input(s): VITAMINB12, FOLATE, FERRITIN, TIBC, IRON, RETICCTPCT in the last 72 hours.  Coagulation profile  Recent Labs Lab 02/13/16 1320 02/14/16 1203  INR 1.18 1.17    No results for input(s): DDIMER in the last 72 hours.  Cardiac Enzymes  Recent Labs Lab 02/13/16 1320  TROPONINI <0.03   ------------------------------------------------------------------------------------------------------------------    Component Value Date/Time   BNP 391.0* 02/13/2016 1721    Micro Results No results found for this or any previous visit (from the past 240 hour(s)).  Radiology  Reports Ct Abdomen W Contrast  02/15/2016  CLINICAL DATA:  Right upper quadrant pain, gallstones, nausea, evaluate for choledocholithiasis EXAM: CT ABDOMEN WITH CONTRAST TECHNIQUE: Multidetector CT imaging of the abdomen was performed using the standard protocol following bolus administration of intravenous contrast. CONTRAST:  100 mL Isovue 300 IV COMPARISON:  Abdominal ultrasound dated 02/13/2016. CT abdomen pelvis dated 03/06/2013. FINDINGS: Lower chest: Small bilateral pleural effusions. Mild patchy opacities in the bilateral lower lobes, likely dependent atelectasis. Hepatobiliary: Liver is notable for mild periportal edema. No enhancing hepatic lesions. Mild gallbladder distention. Associated 2.8 cm gallstone. No associated gallbladder wall thickening or inflammatory changes. Dilated common duct, measuring 9 mm distally (series 4/image 26). Associated 7 mm distal CBD stone (series 7/image 22). Pancreas: Within normal limits. No peripancreatic fluid collections. Spleen: Within normal limits. Adrenals/Urinary Tract: Adrenal glands are within normal limits. Kidneys are within normal limits.  No hydronephrosis. Stomach/Bowel: Stomach is within normal limits. Visualized bowel is unremarkable. Vascular/Lymphatic: Atherosclerotic calcifications of the abdominal aorta. No evidence of abdominal aortic aneurysm.  No suspicious abdominal lymphadenopathy. Other: No abdominal ascites. Musculoskeletal: Degenerative changes of the visualized thoracolumbar spine. IMPRESSION: Cholelithiasis with mild gallbladder distention. No associated inflammatory changes to suggest acute cholecystitis. Dilated common duct, measuring 9 mm, with suspected 7 mm distal CBD stone. Small bilateral pleural effusions. These results were called by telephone at the time of interpretation on 02/15/2016 at 5:04 pm to Dr. Jonette Eva, who verbally acknowledged these results. Electronically Signed   By: Charline Bills M.D.   On: 02/15/2016 17:40    US Abdomen Complete  02/13/2016  CLINICAL DATA:  Acute generalized abdominal pain. EXAM: ABDOMEN ULTRASOUND COMPLETE COMPARISON:  CT scan of March 06, 2013. FINDINGS: Gallbladder: Multiple gallstones are noted with minimal gallbladder wall thickening measured at 4 mm, but no pericholecystic fluid or sonographic Murphy's sign is noted. The largest calculus measures 2.6 cm in size. Common bile duct: Diameter: 5 mm which is within normal limits. Liver: No focal lesion identified. Within normal limits in parenchymal echogenicity. IVC: No abnormality visualized. Pancreas: Not visualized due to overlying bowel gas. Spleen: Size and appearance within normal limits. Right Kidney: Length: 12.8 cm. Echogenicity within normal limits. No mass or hydronephrosis visualized. Left Kidney: Length: 11.7 cm. Echogenicity within normal limits. No mass or hydronephrosis visualized. Abdominal aorta: No aneurysm visualized. Other findings: None. IMPRESSION: Cholelithiasis is noted with minimal gallbladder wall thickening, but no pericholecystic fluid or sonographic Murphy's sign is noted. HIDA scan may be performed for further evaluation. Pancreas is not visualized due to overlying bowel gas. No other abnormality seen in the abdomen. Electronically Signed   By: Lupita Raider, M.D.   On: 02/13/2016 14:01   Dg Chest Port 1 View  02/15/2016  CLINICAL DATA:  Congestive heart failure EXAM: PORTABLE CHEST 1 VIEW COMPARISON:  11/18/2013 FINDINGS: Cardiomegaly again noted. Status post CABG. Dual lead cardiac pacemaker is unchanged in position. No infiltrate or pleural effusion. No pulmonary edema. Surgical clips right supraclavicular region again noted. IMPRESSION: No active disease.  Cardiomegaly.  Status post CABG. Electronically Signed   By: Natasha Mead M.D.   On: 02/15/2016 13:36    Time Spent in minutes  30   [name] M.D on 02/16/2016 at 10:15 AM  Between 7am to 7pm - Pager - [phone]  After 7pm go to  [email or web address] - password [facility]  [facility] -  Office  [phone]

## 2016-02-16 NOTE — Consult Note (Signed)
[facility] CONSULT Reason for consult: possible CBD stone Referring Physician: Dr. Trinda Pascal. PCP: Dr. Melina Copa.  [name] is an 70 y.o. male.  HPI:  he has a significant cardiac history. He has a history of type II diabetes congestive heart failure CAD with a prior history of CABG. He has had cardiac arrhythmias requiring placement of pacemaker/ICD. He has significant ischemic cardiomyopathy and is felt to be a fairly high-risk surgical candidate. He did undergo knee replacement 2014. He presented to the emergency room with a several day history of postprandial abdominal pain. He is known to have gallstones and had previously been evaluated by surgery felt that he was too high risk to undergo cholecystectomy. He felt that this was a gallbladder attack and presented to the emergency room. Ultrasound of the abdomen showed cholelithiasis but are normal CBD of 5 mm. The patient's WBC has been normal. His LFTs revealed elevated AP and TB of 2.1. The patient was seen by G.I. recommended antibiotics and observation. Since he had ICD/p.m. he could not have MRI. He continued to have some pain and TB increased to 2.9. He was evaluated by Dr Oneida Alar who felt that he needed ERCP + stone extraction. She felt that this patient was too complicated to be taken care of in [place] needed to be at [facility] where he could receive more complete care. He therefore was transferred to [facility] come. The patient reports to me that he has had previous EGD colonoscopy and is familiar with endoscopic procedures. He does not think that anything of significance was found. I cannot find a report in epic. His last Plavix was yesterday he still is on one aspirin. He reports that his pain has subsided. He is on Zosyn.  Past Medical History  Diagnosis Date  . Sinus bradycardia   . Tobacco abuse   . Chronic renal insufficiency   . Osteoarthritis   . Diabetes mellitus   . Mitral regurgitation   . Systolic CHF,  chronic (HCC)     Previously taken off lisinopril by primary doctor due to labs  . Carotid stenosis     a. Prior carotid dz/ surgery. b. Carotid dopp 07/2011 - 0-39% bilaterally.;   c. Doppler 11/13: 70-26% RICA, 3-78% LICA  . Cardiomyopathy, ischemic     a. Chronic systolic CHF s/p St. Jude dual chamber ICD 03/2008.  [name] Hx of CABG   . CAD (coronary artery disease)     a. STEMI s/p CABG 07/2007 (had preop cardiogenic shock, IABP, VDRF before surgery)  . Stroke [facility])     a. CVA 1990s - chronic pain in RUE after stroke.  [name] PAF (paroxysmal atrial fibrillation) ([place])     a. Poor Coumadin candidate due to hx of GI bleed.  . GI bleed     Reported history  . Splenomegaly   . Myocardial infarction ([place])   . Automatic implantable cardioverter-defibrillator in situ   . Hypertension     [place] [facility]   . Sleep apnea     study done, 10 yrs ago, unable to tolerate CPAP  . Neuromuscular disorder ([place])     R sided weakness   . Anemia     Multifactorial  - hx of B12 def and iron deficiency, followed by heme[facility]    Past Surgical History  Procedure Laterality Date  . Coronary artery bypass graft    . Percutaneous coronary intervention  01/10/2005    Eustace Quail, MD  .  Coronary artery bypass graft  08/29/2007    x3; Braulio Conte H. Roxy Manns MD  . Cardiac defibrillator placement  04/05/2008    Implantation of a St. Jude dual-chamber defibrillator, Champ Mungo. [name] , MD  . Cardiac defibrillator placement    . Tonsillectomy    . Joint replacement  1990's    L knee  . Total knee arthroplasty Right 08/16/2013    Procedure: RIGHT TOTAL KNEE ARTHROPLASTY;  Surgeon: Mcarthur Rossetti, MD;  Location: [place];  Service: Orthopedics;  Laterality: Right;  . Ep implantable device N/A 10/05/2015    Procedure: ICD Generator Changeout;  Surgeon: Evans Lance, MD;  Location: [facility];  Service: Cardiovascular;  Laterality: N/A;  . Esophagogastroduodenoscopy  07/2007     Rourk: small hh, atrophic gastric mucosa, poor distensibility of stomach ?extrinsic compression, CT showed mild to moderate splenomegaly. No etiology for IDA.  [name] Colonoscopy  07/2007    Rourk: left-sided diverticulum. TI normal. No etiology for IDA.    Family History  Problem Relation Age of Onset  . Heart attack Mother     CVA, MI  . Diabetes Mother   . Heart attack Father     MI  . Diabetes Sister   . Coronary artery disease Brother     CABG    Social History:  reports that he quit smoking about 25 years ago. He quit smokeless tobacco use about 27 years ago. He reports that he does not drink alcohol or use illicit drugs.  Allergies:  Allergies  Allergen Reactions  . Codeine Nausea And Vomiting    Medications; Prior to Admission medications   Medication Sig Start Date End Date Taking? Authorizing Provider  aspirin EC 81 MG EC tablet Take 1 tablet (81 mg total) by mouth daily. 07/29/12  Yes Roger A Arguello, PA-C  atorvastatin (LIPITOR) 80 MG tablet Take 0.5 tablets (40 mg total) by mouth every other day. 07/20/14  Yes Evans Lance, MD  baclofen (LIORESAL) 20 MG tablet Take 20 mg by mouth 2 (two) times daily.    Yes Historical Provider, MD  BISACODYL PO Take 1 tablet by mouth daily as needed (constipation).  12/17/13  Yes Historical Provider, MD  calcium carbonate (TUMS - DOSED IN MG ELEMENTAL CALCIUM) 500 MG chewable tablet Chew 1 tablet by mouth 3 (three) times daily as needed for indigestion or heartburn.  02/14/14  Yes Historical Provider, MD  Canagliflozin (INVOKANA) 300 MG TABS Take 1 tablet by mouth daily.   Yes Historical Provider, MD  carvedilol (COREG) 12.5 MG tablet Take 1 tablet (12.5 mg total) by mouth 2 (two) times daily with a meal. 11/03/14  Yes Herminio Commons, MD  clopidogrel (PLAVIX) 75 MG tablet TAKE 1 TABLET EVERY DAY 01/31/16  Yes Evans Lance, MD  furosemide (LASIX) 40 MG tablet Take 40 mg by mouth daily as needed for fluid.    Yes Historical Provider, MD   glimepiride (AMARYL) 4 MG tablet Take 4 mg by mouth 2 (two) times daily.    Yes Historical Provider, MD  linagliptin (TRADJENTA) 5 MG TABS tablet Take 5 mg by mouth daily after breakfast.    Yes Historical Provider, MD  meclizine (ANTIVERT) 25 MG tablet Take 25 mg by mouth 4 (four) times daily as needed. For dizziness 02/26/12  Yes Historical Provider, MD  metoCLOPramide (REGLAN) 10 MG tablet Take 10 mg by mouth daily as needed for nausea or vomiting.  09/04/15  Yes Historical Provider, MD  nitroGLYCERIN (NITROSTAT)  0.4 MG SL tablet Place 1 tablet (0.4 mg total) under the tongue every 5 (five) minutes as needed for chest pain. 01/02/16  Yes Evans Lance, MD  oxyCODONE-acetaminophen (PERCOCET) 10-325 MG per tablet Take 1 tablet by mouth every 4 (four) hours as needed for pain.  01/26/14  Yes Historical Provider, MD  RANITIDINE HCL PO Take 1 tablet by mouth daily as needed (heartburn or indigestion).  12/17/13  Yes Historical Provider, MD  SENNA CO Take 1 tablet by mouth daily as needed (constipation).  01/10/14  Yes Historical Provider, MD  spironolactone (ALDACTONE) 25 MG tablet Take 1 tablet (25 mg total) by mouth every morning. 07/20/14  Yes Evans Lance, MD   . ampicillin-sulbactam (UNASYN) IV  1.5 g Intravenous Q6H  . baclofen  20 mg Oral BID  . carvedilol  12.5 mg Oral BID WC  . ciprofloxacin-dexamethasone  3 drop Left Ear BID  . heparin  5,000 Units Subcutaneous Q8H  . metoCLOPramide (REGLAN) injection  10 mg Intravenous Q6H  . pneumococcal 23 valent vaccine  0.5 mL Intramuscular Tomorrow-1000   PRN Meds acetaminophen **OR** [DISCONTINUED] acetaminophen, hydrALAZINE, HYDROmorphone (DILAUDID) injection, metoprolol, nitroGLYCERIN, oxyCODONE-acetaminophen **AND** oxyCODONE Results for orders placed or performed during the hospital encounter of 02/13/16 (from the past 48 hour(s))  Glucose, capillary     Status: Abnormal   Collection Time: 02/14/16 11:32 AM  Result Value Ref Range    Glucose-Capillary 51 (L) 65 - 99 mg/dL  Protime-INR     Status: None   Collection Time: 02/14/16 12:03 PM  Result Value Ref Range   Prothrombin Time 15.1 11.6 - 15.2 seconds   INR 1.17 0.00 - 1.49  Glucose, capillary     Status: None   Collection Time: 02/14/16  2:29 PM  Result Value Ref Range   Glucose-Capillary 90 65 - 99 mg/dL  Glucose, capillary     Status: None   Collection Time: 02/14/16  4:10 PM  Result Value Ref Range   Glucose-Capillary 65 65 - 99 mg/dL  Glucose, capillary     Status: None   Collection Time: 02/14/16  8:06 PM  Result Value Ref Range   Glucose-Capillary 71 65 - 99 mg/dL   Comment 1 Notify RN    Comment 2 Document in Chart   Glucose, capillary     Status: None   Collection Time: 02/14/16 11:43 PM  Result Value Ref Range   Glucose-Capillary 82 65 - 99 mg/dL   Comment 1 Notify RN    Comment 2 Document in Chart   Glucose, capillary     Status: None   Collection Time: 02/15/16  4:05 AM  Result Value Ref Range   Glucose-Capillary 91 65 - 99 mg/dL   Comment 1 Notify RN    Comment 2 Document in Chart   Procalcitonin     Status: None   Collection Time: 02/15/16  5:16 AM  Result Value Ref Range   Procalcitonin 0.62 ng/mL  Comprehensive metabolic panel     Status: Abnormal   Collection Time: 02/15/16  5:16 AM  Result Value Ref Range   Sodium 137 135 - 145 mmol/L   Potassium 4.0 3.5 - 5.1 mmol/L   Chloride 106 101 - 111 mmol/L   CO2 24 22 - 32 mmol/L   Glucose, Bld 104 (H) 65 - 99 mg/dL   BUN 12 6 - 20 mg/dL   Creatinine, Ser 0.90 0.61 - 1.24 mg/dL   Calcium 8.0 (L) 8.9 - 10.3 mg/dL  Total Protein 5.9 (L) 6.5 - 8.1 g/dL   Albumin 3.1 (L) 3.5 - 5.0 g/dL   AST 491 (H) 15 - 41 U/L   ALT 155 (H) 17 - 63 U/L   Alkaline Phosphatase 186 (H) 38 - 126 U/L   Total Bilirubin 2.9 (H) 0.3 - 1.2 mg/dL   GFR calc non Af Amer >60 >60 mL/min   GFR calc Af Amer >60 >60 mL/min    Comment: (NOTE) The eGFR has been calculated using the CKD EPI equation. This  calculation has not been validated in all clinical situations. eGFR's persistently <60 mL/min signify possible Chronic Kidney Disease.    Anion gap 7 5 - 15  CBC     Status: Abnormal   Collection Time: 02/15/16  5:16 AM  Result Value Ref Range   WBC 5.2 4.0 - 10.5 K/uL   RBC 4.54 4.22 - 5.81 MIL/uL   Hemoglobin 13.0 13.0 - 17.0 g/dL   HCT 49.0 87.6 - 99.7 %   MCV 88.5 78.0 - 100.0 fL   MCH 28.6 26.0 - 34.0 pg   MCHC 32.3 30.0 - 36.0 g/dL   RDW 26.9 (H) 02.8 - 06.8 %   Platelets 125 (L) 150 - 400 K/uL  Lipase, blood     Status: Abnormal   Collection Time: 02/15/16  5:16 AM  Result Value Ref Range   Lipase <10 (L) 11 - 51 U/L  Bilirubin, direct     Status: Abnormal   Collection Time: 02/15/16  5:24 AM  Result Value Ref Range   Bilirubin, Direct 2.1 (H) 0.1 - 0.5 mg/dL  Glucose, capillary     Status: Abnormal   Collection Time: 02/15/16  7:36 AM  Result Value Ref Range   Glucose-Capillary 126 (H) 65 - 99 mg/dL   Comment 1 Notify RN    Comment 2 Document in Chart   Glucose, capillary     Status: Abnormal   Collection Time: 02/15/16 11:51 AM  Result Value Ref Range   Glucose-Capillary 192 (H) 65 - 99 mg/dL   Comment 1 Notify RN    Comment 2 Document in Chart   Glucose, capillary     Status: Abnormal   Collection Time: 02/15/16  5:20 PM  Result Value Ref Range   Glucose-Capillary 180 (H) 65 - 99 mg/dL   Comment 1 Notify RN    Comment 2 Document in Chart   Glucose, capillary     Status: Abnormal   Collection Time: 02/15/16  8:11 PM  Result Value Ref Range   Glucose-Capillary 130 (H) 65 - 99 mg/dL  Glucose, capillary     Status: Abnormal   Collection Time: 02/15/16 11:54 PM  Result Value Ref Range   Glucose-Capillary 154 (H) 65 - 99 mg/dL  Glucose, capillary     Status: Abnormal   Collection Time: 02/16/16  4:29 AM  Result Value Ref Range   Glucose-Capillary 180 (H) 65 - 99 mg/dL  Glucose, capillary     Status: Abnormal   Collection Time: 02/16/16  8:39 AM  Result  Value Ref Range   Glucose-Capillary 187 (H) 65 - 99 mg/dL    Ct Abdomen W Contrast  02/15/2016  CLINICAL DATA:  Right upper quadrant pain, gallstones, nausea, evaluate for choledocholithiasis EXAM: CT ABDOMEN WITH CONTRAST TECHNIQUE: Multidetector CT imaging of the abdomen was performed using the standard protocol following bolus administration of intravenous contrast. CONTRAST:  100 mL Isovue 300 IV COMPARISON:  Abdominal ultrasound dated 02/13/2016. CT abdomen pelvis dated  03/06/2013. FINDINGS: Lower chest: Small bilateral pleural effusions. Mild patchy opacities in the bilateral lower lobes, likely dependent atelectasis. Hepatobiliary: Liver is notable for mild periportal edema. No enhancing hepatic lesions. Mild gallbladder distention. Associated 2.8 cm gallstone. No associated gallbladder wall thickening or inflammatory changes. Dilated common duct, measuring 9 mm distally (series 4/image 26). Associated 7 mm distal CBD stone (series 7/image 22). Pancreas: Within normal limits. No peripancreatic fluid collections. Spleen: Within normal limits. Adrenals/Urinary Tract: Adrenal glands are within normal limits. Kidneys are within normal limits.  No hydronephrosis. Stomach/Bowel: Stomach is within normal limits. Visualized bowel is unremarkable. Vascular/Lymphatic: Atherosclerotic calcifications of the abdominal aorta. No evidence of abdominal aortic aneurysm. No suspicious abdominal lymphadenopathy. Other: No abdominal ascites. Musculoskeletal: Degenerative changes of the visualized thoracolumbar spine. IMPRESSION: Cholelithiasis with mild gallbladder distention. No associated inflammatory changes to suggest acute cholecystitis. Dilated common duct, measuring 9 mm, with suspected 7 mm distal CBD stone. Small bilateral pleural effusions. These results were called by telephone at the time of interpretation on 02/15/2016 at 5:04 pm to Dr. Barney Drain, who verbally acknowledged these results. Electronically Signed    By: Julian Hy M.D.   On: 02/15/2016 17:40   Dg Chest Port 1 View  02/15/2016  CLINICAL DATA:  Congestive heart failure EXAM: PORTABLE CHEST 1 VIEW COMPARISON:  11/18/2013 FINDINGS: Cardiomegaly again noted. Status post CABG. Dual lead cardiac pacemaker is unchanged in position. No infiltrate or pleural effusion. No pulmonary edema. Surgical clips right supraclavicular region again noted. IMPRESSION: No active disease.  Cardiomegaly.  Status post CABG. Electronically Signed   By: Lahoma Crocker M.D.   On: 02/15/2016 13:36               Blood pressure 139/62, pulse 60, temperature 98 F (36.7 C), temperature source Oral, resp. rate 18, height _0  (1.854 m), weight 95.936 kg (211 lb 8 oz), SpO2 99 %.  Physical exam:   General-- alert white male no acute distress ENT-- nonicteric Neck-- no lymphadenopathy, supple Heart-- regular rate and rhythm. ICD right supraclavicular area Lungs-- clear Abdomen-- good bowel sounds nondistended and nontender to exam today Psych-- alert and oriented answers questions appropriately   Assessment: 1. Probable CBD stone. CT scan suggests slightly dilated CBD with probable distal calcification. Ultrasound showed a normal CBD. With the patient's elevated liver test I think you'll need a sphincterotomy/ERCP. He apparently has been felt to be too high risk for cholecystectomy in the past due to his heart disease. 2. Gallstones 3. Significant cardiac disease with ischemic cardiomyopathy, CAD, atrial fib, pacemaker/ICD  Plan: 1. We will hold the aspirin today. Continue subacute heparin for now. We'll try to make plans for ERCP for Monday with anesthesia. Should the patient developed signs of cholangitis, he may need ERCP and stent placement this weekend. Have discussed all this with the patient. Have discussed the procedure ERCP and stone extraction including the risk of bleeding and pancreatitis with the patient and 2 daughters. 2. We will go ahead  and let him have ice chips with sips of ginger ale only in case ERCP needed over the weekend. 3. Would follow daily LFTs and CBC.   Janaiyah Blackard JR,Bryttany Tortorelli L 02/16/2016, 9:10 AM   This note was created using voice recognition software and minor errors may Have occurred unintentionally. Pager: [phone] If no answer or after hours call [phone]

## 2016-02-16 NOTE — Progress Notes (Signed)
Patient ID: [name], male   DOB: [birth year], 70 y.o.   MRN: [id number]  Detailed original consult note from Dr Purvis Sheffield reviewed, agree with his his assessment. Appears he likely will have ERCP done Monday. From cardiac standpoint he is at increased risk for the procedure due to his chronic heart conditions. He is at increased risk but not prohibitive risk, recommend proceeding as planned. There are no cardiac interventions indicated prior to procedure, continue cardiac medications in perioperative period. Ok to hold antiplatelets in anticipation of procedure. May contact us if needed during the periop period.  ] Dominga Ferry MD

## 2016-02-16 NOTE — Progress Notes (Signed)
[facility] hospitalist progress note. Chief complaint. Transfer note. History of present illness. This 70 year old male transferred from [facility] to [facility] per gastroenterology request. Patient with ascending cholangitis and the patient with history of gallstones. The patient has now arrived in transfer and I'm seeing him at bedside to ensure he remains clinically stable and that is worse of transferred here appropriately. Physical exam. Vital signs. Temperature 98.1, pulse 60, respiration 18, blood pressure 123/68. O2 sats 97%. General appearance. Well-developed male was alert and in no distress. Cardiac. Rate and rhythm regular. Lungs. Breath sounds are clear and equal. Abdomen. Soft with bowel sounds present. Diffuse pain with palpation. Impression/plan. Problem #1. Ascending cholangitis. Patient on empiric antibiotics with Unasyn. GI and surgery will continue to follow Problem #2. History coronary artery disease. Currently chest pain-free. Holding aspirin and Plavix due to possible surgical procedure for cholangitis. Problem #3. Prolonged QTc. Patient on beta blocker. Given IV magnesium. Monitor and avoid QTc prolonging agents. Problem #4. Dyslipidemia. Statin on hold currently due to elevated liver function tests. Problem #5. Anemia chronic disease. Continue to monitor. Problem #6. Essential hypertension. Blood pressure controlled on current medications. Problem #7. Atrial fib. Continue beta blocker. Problem #8. Diabetes. Patient nothing by mouth.: With sliding-scale coverage. Patient appears clinically stable post transfer. All orders appear to have transferred here appropriately.

## 2016-02-17 LAB — COMPREHENSIVE METABOLIC PANEL
ALT: 99 U/L — AB (ref 17–63)
AST: 59 U/L — AB (ref 15–41)
Albumin: 2.4 g/dL — ABNORMAL LOW (ref 3.5–5.0)
Alkaline Phosphatase: 276 U/L — ABNORMAL HIGH (ref 38–126)
Anion gap: 11 (ref 5–15)
BILIRUBIN TOTAL: 4.3 mg/dL — AB (ref 0.3–1.2)
BUN: 6 mg/dL (ref 6–20)
CHLORIDE: 105 mmol/L (ref 101–111)
CO2: 24 mmol/L (ref 22–32)
CREATININE: 0.9 mg/dL (ref 0.61–1.24)
Calcium: 8.2 mg/dL — ABNORMAL LOW (ref 8.9–10.3)
Glucose, Bld: 131 mg/dL — ABNORMAL HIGH (ref 65–99)
POTASSIUM: 3.5 mmol/L (ref 3.5–5.1)
Sodium: 140 mmol/L (ref 135–145)
TOTAL PROTEIN: 5.3 g/dL — AB (ref 6.5–8.1)

## 2016-02-17 LAB — CBC
HEMATOCRIT: 38.1 % — AB (ref 39.0–52.0)
Hemoglobin: 12.3 g/dL — ABNORMAL LOW (ref 13.0–17.0)
MCH: 27.6 pg (ref 26.0–34.0)
MCHC: 32.3 g/dL (ref 30.0–36.0)
MCV: 85.4 fL (ref 78.0–100.0)
PLATELETS: 165 10*3/uL (ref 150–400)
RBC: 4.46 MIL/uL (ref 4.22–5.81)
RDW: 18.4 % — AB (ref 11.5–15.5)
WBC: 3.9 10*3/uL — AB (ref 4.0–10.5)

## 2016-02-17 LAB — GLUCOSE, CAPILLARY
GLUCOSE-CAPILLARY: 144 mg/dL — AB (ref 65–99)
GLUCOSE-CAPILLARY: 123 mg/dL — AB (ref 65–99)
GLUCOSE-CAPILLARY: 113 mg/dL — AB (ref 65–99)
GLUCOSE-CAPILLARY: 116 mg/dL — AB (ref 65–99)
Glucose-Capillary: 132 mg/dL — ABNORMAL HIGH (ref 65–99)
Glucose-Capillary: 137 mg/dL — ABNORMAL HIGH (ref 65–99)
Glucose-Capillary: 97 mg/dL (ref 65–99)

## 2016-02-17 NOTE — Progress Notes (Signed)
[facility] PROGRESS NOTE Subjective patient having minimal if any pain tolerating liquids. Denies fever or chills. He has ICD that has never gone off. Because of history of prior G.I. bleeding he is not on anticoagulation for his atrial fib and multiple other cardiac problems. He is currently on heparin sub Q 5000 units Q8h. TB has risen from 1.6 to 4.3 other LFTs remain the same, his WBC remains normal. He is on Unasyn antibiotic. He has been seen by cardiology and he is felt to be at higher risk for ERCP due to all of the above problems that the procedure needs to be done in the risk was not felt to be prohibitive. Cardiology is on board for any assistance for any perioperative problems  Objective: Vital signs in last 24 hours: Temp:  [97.6 F (36.4 C)-98.5 F (36.9 C)] 97.9 F (36.6 C) (05/21 0609) Pulse Rate:  [59-60] 59 (05/21 0609) Resp:  [16-18] 18 (05/21 0609) BP: (126-156)/(57-74) 145/57 mmHg (05/21 0609) SpO2:  [98 %-100 %] 99 % (05/21 0609) Weight:  [95.21 kg (209 lb 14.4 oz)] 95.21 kg (209 lb 14.4 oz) (05/21 0609) Last BM Date: 02/15/16  Intake/Output from previous day: 05/20 0701 - 05/21 0700 In: 1373.5 [P.O.:666; I.V.:557.5; IV Piggyback:150] Out: 1150 [Urine:1150] Intake/Output this shift: Total I/O In: 222 [P.O.:222] Out: -   PE: General-- alert watching television, has been afebrile since admission Heart-- irregular heart rate Lungs-- grossly clear Abdomen-- nondistended soft and completely nontender with good bowel sounds  Lab Results:  Recent Labs  02/15/16 0516 02/16/16 0809 02/17/16 0425  WBC 5.2 4.1 3.9*  HGB 13.0 13.0 12.3*  HCT 40.2 39.6 38.1*  PLT 125* 144* 165   BMET  Recent Labs  02/15/16 0516 02/16/16 0809 02/17/16 0425  NA 137 139 140  K 4.0 4.0 3.5  CL 106 106 105  CO2 CREATININE 0.90 0.89 0.90   LFT  Recent Labs  02/15/16 0516 02/15/16 0524 02/16/16 0809 02/17/16 0425  PROT 5.9*  --  5.4* 5.3*  AST  112*  --  70* 59*  ALT 155*  --  122* 99*  ALKPHOS 186*  --  273* 276*  BILITOT 2.9*  --  3.7* 4.3*  BILIDIR  --  2.1*  --   --    PT/INR  Recent Labs  02/14/16 1203  LABPROT 15.1  INR 1.17   PANCREAS  Recent Labs  02/15/16 0516  LIPASE <10*         Studies/Results: Ct Abdomen W Contrast  02/15/2016  CLINICAL DATA:  Right upper quadrant pain, gallstones, nausea, evaluate for choledocholithiasis EXAM: CT ABDOMEN WITH CONTRAST TECHNIQUE: Multidetector CT imaging of the abdomen was performed using the standard protocol following bolus administration of intravenous contrast. CONTRAST:  100 mL Isovue 300 IV COMPARISON:  Abdominal ultrasound dated 02/13/2016. CT abdomen pelvis dated 03/06/2013. FINDINGS: Lower chest: Small bilateral pleural effusions. Mild patchy opacities in the bilateral lower lobes, likely dependent atelectasis. Hepatobiliary: Liver is notable for mild periportal edema. No enhancing hepatic lesions. Mild gallbladder distention. Associated 2.8 cm gallstone. No associated gallbladder wall thickening or inflammatory changes. Dilated common duct, measuring 9 mm distally (series 4/image 26). Associated 7 mm distal CBD stone (series 7/image 22). Pancreas: Within normal limits. No peripancreatic fluid collections. Spleen: Within normal limits. Adrenals/Urinary Tract: Adrenal glands are within normal limits. Kidneys are within normal limits.  No hydronephrosis. Stomach/Bowel: Stomach is within normal limits. Visualized bowel is unremarkable. Vascular/Lymphatic: Atherosclerotic calcifications of the  abdominal aorta. No evidence of abdominal aortic aneurysm. No suspicious abdominal lymphadenopathy. Other: No abdominal ascites. Musculoskeletal: Degenerative changes of the visualized thoracolumbar spine. IMPRESSION: Cholelithiasis with mild gallbladder distention. No associated inflammatory changes to suggest acute cholecystitis. Dilated common duct, measuring 9 mm, with suspected 7 mm  distal CBD stone. Small bilateral pleural effusions. These results were called by telephone at the time of interpretation on 02/15/2016 at 5:04 pm to Dr. Jonette Eva, who verbally acknowledged these results. Electronically Signed   By: Charline Bills M.D.   On: 02/15/2016 17:40   Dg Chest Port 1 View  02/15/2016  CLINICAL DATA:  Congestive heart failure EXAM: PORTABLE CHEST 1 VIEW COMPARISON:  11/18/2013 FINDINGS: Cardiomegaly again noted. Status post CABG. Dual lead cardiac pacemaker is unchanged in position. No infiltrate or pleural effusion. No pulmonary edema. Surgical clips right supraclavicular region again noted. IMPRESSION: No active disease.  Cardiomegaly.  Status post CABG. Electronically Signed   By: Natasha Mead M.D.   On: 02/15/2016 13:36    Medications: I have reviewed the patient's current medications.  Assessment/Plan: 1. CBD stone suggested by CT. Patient not MR candidate because of ICD. Progressive rising bilirubin is consistent with this. He does not currently have any suggestion of cholangitis and is on broad-spectrum antibiotics. ERCP is scheduled in the morning with general anesthesia and full support and backup from cardiology etc. Should the patient developed signs of cholangitis today this would need to be done urgently and probably a stent inserted. We have discussed this with him detail. 2. Significant cardiac history. He has ICD history of severe ischemic cardiomyopathy, atrial fibrillation, history of stroke etc.   P: ERCP with sphincterotomy and stone extraction scheduled for tomorrow morning at 8 AM under general anesthesia by Dr. Kristopher Glee. He will receive his 10 PM subacute heparin and we will hold his 7 AM heparin in the morning. Routine pre-ERCP orders otherwise. Long discussion with the patient and his family yesterday about ERCP with the complications stone extraction causing pancreatitis, and in his case bleeding or cardiac issues. Complications of general  anesthesia discussed. Orders for the procedure have been written.   Elva Breaker JR,Bobbi Kozakiewicz L 02/17/2016, 7:58 AM  This note was created using voice recognition software. Minor errors may Have occurred unintentionally.  Pager: [phone] If no answer or after hours call [phone]

## 2016-02-17 NOTE — Progress Notes (Signed)
PROGRESS NOTE                                                                                                                                                                                                             Patient Demographics:    [name], is a 70 y.o. male, DOB - 05-Sep-1946, GNF:[id number]  Admit date - 02/13/2016   Admitting Physician Briscoe Deutscher, MD  Outpatient Primary MD for the patient is CYNTHIA BUTLER, DO  LOS - 4   Chief Complaint  Patient presents with  . Fatigue       Brief Narrative -   [name] is a 70 y.o. male with medical history significant for CAD status post remote CABG, chronic combined CHF with AICD, iron deficiency anemia, type 2 diabetes mellitus, and paroxysmal atrial fibrillation not anticoagulated due to a GI bleed, TKR 1 yr ago and tolerated it, admitted for recurrent Cholangitis with Gall stones on [place], GI and Surg called.    Subjective:    [name] today has, No headache, No chest pain, +ve RUQ abdominal pain but much improved than before - No Nausea, No new weakness tingling or numbness, No Cough - SOB.     Assessment  & Plan :    1. Ascending cholangitis in a patient with history of gallstones. LFTs improving Question moving stone in the CBD or stone which has passed CBD, on empiric antibiotic Unasyn, GI and surgery following, has AICD. Currently nothing by mouth with IV fluids and antibiotics.  He was initially admitted to [facility] but on 02/15/2016 transferred to [facility] for further care, either GI following, tentative plans to do ERCP on 02/18/2016. Discussed with Dr. Randa Evens. His pain actually is better on 02/16/2016.  2. 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 Currently compensated and chest pain-free, has AICD, continue beta blocker for secondary prevention and monitor. Last Plavix dose was  02/13/2016 at home, aspirin now discontinued by GI on 02/16/2016 as he might require further procedures for cholangitis.  He definitely will be a high-risk candidate for surgery, cardiology following during the perioperative period.  3. Prolonged QTC. On beta blocker, provided 1 g of magnesium. Monitor. Avoid Zofran and other QTC prolonging agents.   4. Dyslipidemia. Statin on hold due to will enjoy  to send elevated LFTs.  5. Chronic anemia. Outpatient follow-up. Monitor.   6. Essential hypertension. For now on Coreg. Diuretics on hold. Added as needed IV hydralazine and Lopressor.  7. P. Afib ChadVasc 2 score > 4 - on B blocker, not on anticoagulation due to H/O GIB, resume aspirin/Plavix post surgery/ERCP.  8. DM type II. Had episodes of hypoglycemia, since nothing by mouth Will monitor off insulin sliding scale and do every 6 CBGs.  Lab Results  Component Value Date   HGBA1C 7.3* 02/14/2016   CBG (last 3)   Recent Labs  02/17/16 0544 02/17/16 0604 02/17/16 0809  GLUCAP 132* 137* 123*      Code Status :  Full code  Family Communication  :  Family bedside  Disposition Plan  : Remain inpatient  Consults  : GI, Surg, Cards  Procedures  :    RUQ [place] - Gall stones  CT - CBD stone and Gallstones  DVT Prophylaxis  :    Heparin    Lab Results  Component Value Date   PLT 165 02/17/2016    Inpatient Medications  Scheduled Meds: . ampicillin-sulbactam (UNASYN) IV  1.5 g Intravenous Q6H  . baclofen  20 mg Oral BID  . carvedilol  12.5 mg Oral BID WC  . ciprofloxacin-dexamethasone  3 drop Left Ear BID  . heparin  5,000 Units Subcutaneous Q8H  . loratadine  10 mg Oral Daily  . metoCLOPramide (REGLAN) injection  10 mg Intravenous Q6H  . pneumococcal 23 valent vaccine  0.5 mL Intramuscular Tomorrow-1000   Continuous Infusions: . dextrose 5 % and 0.9% NaCl 75 mL/hr at 02/16/16 2311   PRN Meds:.acetaminophen **OR** [DISCONTINUED] acetaminophen, hydrALAZINE,  HYDROmorphone (DILAUDID) injection, meclizine, metoprolol, nitroGLYCERIN, oxyCODONE-acetaminophen **AND** oxyCODONE  Antibiotics  :    Anti-infectives    Start     Dose/Rate Route Frequency Ordered Stop   02/15/16 0830  ampicillin-sulbactam (UNASYN) 1.5 g in sodium chloride 0.9 % 50 mL IVPB     1.5 g 100 mL/hr over 30 Minutes Intravenous Every 6 hours 02/15/16 0822     02/14/16 0930  piperacillin-tazobactam (ZOSYN) IVPB 3.375 g  Status:  Discontinued     3.375 g 12.5 mL/hr over 240 Minutes Intravenous Every 8 hours 02/14/16 0922 02/15/16 0822   02/13/16 1430  piperacillin-tazobactam (ZOSYN) IVPB 3.375 g     3.375 g 100 mL/hr over 30 Minutes Intravenous  Once 02/13/16 1417 02/13/16 1457         Objective:   Filed Vitals:   02/16/16 1126 02/16/16 1756 02/16/16 2031 02/17/16 0609  BP: 126/63 156/74 143/73 145/57  Pulse: 60 60  59  Temp: 98.5 F (36.9 C) 97.6 F (36.4 C) 97.8 F (36.6 C) 97.9 F (36.6 C)  TempSrc: Oral Oral Oral Oral  Resp: Height:      Weight:    95.21 kg (209 lb 14.4 oz)  SpO2: 98% 100% 98% 99%    Wt Readings from Last 3 Encounters:  02/17/16 95.21 kg (209 lb 14.4 oz)  02/12/16 96.163 kg (212 lb)  01/02/16 95.981 kg (211 lb 9.6 oz)     Intake/Output Summary (Last 24 hours) at 02/17/16 1012 Last data filed at 02/17/16 0746  Gross per 24 hour  Intake 1595.5 ml  Output   1400 ml  Net  195.5 ml     Physical Exam  Awake Alert, Oriented X 3, No new F.N deficits, Normal affect Donovan.AT,PERRAL Supple Neck,No JVD, No cervical lymphadenopathy  appriciated.  Symmetrical Chest wall movement, Good air movement bilaterally, CTAB RRR,No Gallops,Rubs or new Murmurs, No Parasternal Heave +ve B.Sounds, Abd Soft, No tenderness, No organomegaly appriciated, No rebound - guarding or rigidity. No Cyanosis, Clubbing or edema, No new Rash or bruise       Data Review:    CBC  Recent Labs Lab 02/13/16 1320 02/14/16 0559 02/15/16 0516  02/16/16 0809 02/17/16 0425  WBC 7.5 4.6 5.2 4.1 3.9*  HGB 12.7* 12.5* 13.0 13.0 12.3*  HCT 38.5* 38.2* 40.2 39.6 38.1*  PLT 128* 110* 125* 144* 165  MCV 87.1 88.6 88.5 85.3 85.4  MCH 28.7 29.0 28.6 28.0 27.6  MCHC 33.0 32.7 32.3 32.8 32.3  RDW 18.1* 18.4* 18.3* 18.0* 18.4*  LYMPHSABS 0.3* 0.3*  --   --   --   MONOABS 0.9 0.7  --   --   --   EOSABS 0.0 0.0  --   --   --   BASOSABS 0.0 0.0  --   --   --     Chemistries   Recent Labs Lab 02/13/16 1320 02/13/16 1715 02/14/16 0559 02/15/16 0516 02/16/16 0809 02/17/16 0425  NA 136  --  138 137 139 140  K 3.4*  --  3.8 4.0 4.0 3.5  CL 104  --  108 106 106 105  CO2 25  --  26 24 24 24   GLUCOSE 157*  --  82 104* 177* 131*  BUN 21*  --  17 12 7 6   CREATININE 1.08  --  1.09 0.90 0.89 0.90  CALCIUM 8.2*  --  7.9* 8.0* 8.2* 8.2*  MG  --  1.7  --   --  2.3  --   AST 242*  --  125* 112* 70* 59*  ALT 263*  --  177* 155* 122* 99*  ALKPHOS 192*  --  152* 186* 273* 276*  BILITOT 2.1*  --  1.6* 2.9* 3.7* 4.3*   ------------------------------------------------------------------------------------------------------------------ No results for input(s): CHOL, HDL, LDLCALC, TRIG, CHOLHDL, LDLDIRECT in the last 72 hours.  Lab Results  Component Value Date   HGBA1C 7.3* 02/14/2016   ------------------------------------------------------------------------------------------------------------------ No results for input(s): TSH, T4TOTAL, T3FREE, THYROIDAB in the last 72 hours.  Invalid input(s): FREET3 ------------------------------------------------------------------------------------------------------------------ No results for input(s): VITAMINB12, FOLATE, FERRITIN, TIBC, IRON, RETICCTPCT in the last 72 hours.  Coagulation profile  Recent Labs Lab 02/13/16 1320 02/14/16 1203  INR 1.18 1.17    No results for input(s): DDIMER in the last 72 hours.  Cardiac Enzymes  Recent Labs Lab 02/13/16 1320  TROPONINI <0.03    ------------------------------------------------------------------------------------------------------------------    Component Value Date/Time   BNP 391.0* 02/13/2016 1721    Micro Results No results found for this or any previous visit (from the past 240 hour(s)).  Radiology Reports Ct Abdomen W Contrast  02/15/2016  CLINICAL DATA:  Right upper quadrant pain, gallstones, nausea, evaluate for choledocholithiasis EXAM: CT ABDOMEN WITH CONTRAST TECHNIQUE: Multidetector CT imaging of the abdomen was performed using the standard protocol following bolus administration of intravenous contrast. CONTRAST:  100 mL Isovue 300 IV COMPARISON:  Abdominal ultrasound dated 02/13/2016. CT abdomen pelvis dated 03/06/2013. FINDINGS: Lower chest: Small bilateral pleural effusions. Mild patchy opacities in the bilateral lower lobes, likely dependent atelectasis. Hepatobiliary: Liver is notable for mild periportal edema. No enhancing hepatic lesions. Mild gallbladder distention. Associated 2.8 cm gallstone. No associated gallbladder wall thickening or inflammatory changes. Dilated common duct, measuring 9 mm distally (series 4/image 26). Associated 7 mm distal  CBD stone (series 7/image 22). Pancreas: Within normal limits. No peripancreatic fluid collections. Spleen: Within normal limits. Adrenals/Urinary Tract: Adrenal glands are within normal limits. Kidneys are within normal limits.  No hydronephrosis. Stomach/Bowel: Stomach is within normal limits. Visualized bowel is unremarkable. Vascular/Lymphatic: Atherosclerotic calcifications of the abdominal aorta. No evidence of abdominal aortic aneurysm. No suspicious abdominal lymphadenopathy. Other: No abdominal ascites. Musculoskeletal: Degenerative changes of the visualized thoracolumbar spine. IMPRESSION: Cholelithiasis with mild gallbladder distention. No associated inflammatory changes to suggest acute cholecystitis. Dilated common duct, measuring 9 mm, with suspected  7 mm distal CBD stone. Small bilateral pleural effusions. These results were called by telephone at the time of interpretation on 02/15/2016 at 5:04 pm to Dr. Jonette Eva, who verbally acknowledged these results. Electronically Signed   By: Charline Bills M.D.   On: 02/15/2016 17:40   US Abdomen Complete  02/13/2016  CLINICAL DATA:  Acute generalized abdominal pain. EXAM: ABDOMEN ULTRASOUND COMPLETE COMPARISON:  CT scan of March 06, 2013. FINDINGS: Gallbladder: Multiple gallstones are noted with minimal gallbladder wall thickening measured at 4 mm, but no pericholecystic fluid or sonographic Murphy's sign is noted. The largest calculus measures 2.6 cm in size. Common bile duct: Diameter: 5 mm which is within normal limits. Liver: No focal lesion identified. Within normal limits in parenchymal echogenicity. IVC: No abnormality visualized. Pancreas: Not visualized due to overlying bowel gas. Spleen: Size and appearance within normal limits. Right Kidney: Length: 12.8 cm. Echogenicity within normal limits. No mass or hydronephrosis visualized. Left Kidney: Length: 11.7 cm. Echogenicity within normal limits. No mass or hydronephrosis visualized. Abdominal aorta: No aneurysm visualized. Other findings: None. IMPRESSION: Cholelithiasis is noted with minimal gallbladder wall thickening, but no pericholecystic fluid or sonographic Murphy's sign is noted. HIDA scan may be performed for further evaluation. Pancreas is not visualized due to overlying bowel gas. No other abnormality seen in the abdomen. Electronically Signed   By: Lupita Raider, M.D.   On: 02/13/2016 14:01   Dg Chest Port 1 View  02/15/2016  CLINICAL DATA:  Congestive heart failure EXAM: PORTABLE CHEST 1 VIEW COMPARISON:  11/18/2013 FINDINGS: Cardiomegaly again noted. Status post CABG. Dual lead cardiac pacemaker is unchanged in position. No infiltrate or pleural effusion. No pulmonary edema. Surgical clips right supraclavicular region again noted.  IMPRESSION: No active disease.  Cardiomegaly.  Status post CABG. Electronically Signed   By: Natasha Mead M.D.   On: 02/15/2016 13:36    Time Spent in minutes  30   SINGH,PRASHANT K M.D on 02/17/2016 at 10:12 AM  Between 7am to 7pm - Pager - [phone]  After 7pm go to [email or web address] - password [facility]  [facility] -  Office  [phone]

## 2016-02-18 ENCOUNTER — Inpatient Hospital Stay (HOSPITAL_COMMUNITY): Payer: Medicare HMO | Admitting: Anesthesiology

## 2016-02-18 ENCOUNTER — Encounter (HOSPITAL_COMMUNITY)
Admission: EM | Disposition: A | Discharge status: Home Health | Payer: Self-pay | Source: Home / Self Care | Attending: Internal Medicine

## 2016-02-18 ENCOUNTER — Inpatient Hospital Stay (HOSPITAL_COMMUNITY): Payer: Medicare HMO

## 2016-02-18 ENCOUNTER — Encounter (HOSPITAL_COMMUNITY): Payer: Self-pay | Admitting: Anesthesiology

## 2016-02-18 DIAGNOSIS — I509 Heart failure, unspecified: Secondary | ICD-10-CM | POA: Insufficient documentation

## 2016-02-18 HISTORY — PX: ERCP: SHX5425

## 2016-02-18 LAB — COMPREHENSIVE METABOLIC PANEL
ALK PHOS: 247 U/L — AB (ref 38–126)
ALT: 106 U/L — ABNORMAL HIGH (ref 17–63)
ANION GAP: 8 (ref 5–15)
AST: 86 U/L — ABNORMAL HIGH (ref 15–41)
Albumin: 2.6 g/dL — ABNORMAL LOW (ref 3.5–5.0)
BILIRUBIN TOTAL: 2.4 mg/dL — AB (ref 0.3–1.2)
BUN: <5 mg/dL — ABNORMAL LOW (ref 6–20)
CALCIUM: 8.2 mg/dL — AB (ref 8.9–10.3)
CO2: 24 mmol/L (ref 22–32)
Chloride: 110 mmol/L (ref 101–111)
Creatinine, Ser: 0.75 mg/dL (ref 0.61–1.24)
GFR calc non Af Amer: >60 mL/min (ref >60)
Glucose, Bld: 114 mg/dL — ABNORMAL HIGH (ref 65–99)
POTASSIUM: 3.6 mmol/L (ref 3.5–5.1)
SODIUM: 142 mmol/L (ref 135–145)
TOTAL PROTEIN: 5.2 g/dL — AB (ref 6.5–8.1)

## 2016-02-18 LAB — CBC
HCT: 37.5 % — ABNORMAL LOW (ref 39.0–52.0)
HEMOGLOBIN: 12.1 g/dL — AB (ref 13.0–17.0)
MCH: 27.4 pg (ref 26.0–34.0)
MCHC: 32.3 g/dL (ref 30.0–36.0)
MCV: 84.8 fL (ref 78.0–100.0)
Platelets: 170 10*3/uL (ref 150–400)
RBC: 4.42 MIL/uL (ref 4.22–5.81)
RDW: 18.5 % — AB (ref 11.5–15.5)
WBC: 5.3 10*3/uL (ref 4.0–10.5)

## 2016-02-18 LAB — PROTIME-INR
INR: 1.08 (ref 0.00–1.49)
PROTHROMBIN TIME: 14.2 s (ref 11.6–15.2)

## 2016-02-18 LAB — GLUCOSE, CAPILLARY
GLUCOSE-CAPILLARY: 121 mg/dL — AB (ref 65–99)
GLUCOSE-CAPILLARY: 119 mg/dL — AB (ref 65–99)
GLUCOSE-CAPILLARY: 156 mg/dL — AB (ref 65–99)
GLUCOSE-CAPILLARY: 163 mg/dL — AB (ref 65–99)
Glucose-Capillary: 128 mg/dL — ABNORMAL HIGH (ref 65–99)

## 2016-02-18 SURGERY — ERCP, WITH INTERVENTION IF INDICATED
Anesthesia: General

## 2016-02-18 MED ORDER — GLYCOPYRROLATE 0.2 MG/ML IJ SOLN
INTRAMUSCULAR | Status: DC | PRN
  Administered 2016-02-18: 0.4 mg via INTRAVENOUS

## 2016-02-18 MED ORDER — SUCCINYLCHOLINE CHLORIDE 20 MG/ML IJ SOLN
INTRAMUSCULAR | Status: DC | PRN
  Administered 2016-02-18: 60 mg via INTRAVENOUS

## 2016-02-18 MED ORDER — EPHEDRINE SULFATE 50 MG/ML IJ SOLN
INTRAMUSCULAR | Status: DC | PRN
  Administered 2016-02-18 (×2): 5 mg via INTRAVENOUS

## 2016-02-18 MED ORDER — NEOSTIGMINE METHYLSULFATE 10 MG/10ML IV SOLN
INTRAVENOUS | Status: DC | PRN
  Administered 2016-02-18: 3 mg via INTRAVENOUS

## 2016-02-18 MED ORDER — PROPOFOL 10 MG/ML IV BOLUS
INTRAVENOUS | Status: DC | PRN
  Administered 2016-02-18: 50 mg via INTRAVENOUS
  Administered 2016-02-18: 40 mg via INTRAVENOUS
  Administered 2016-02-18: 30 mg via INTRAVENOUS
  Administered 2016-02-18: 20 mg via INTRAVENOUS

## 2016-02-18 MED ORDER — LACTATED RINGERS IV SOLN
INTRAVENOUS | Status: DC
  Administered 2016-02-18: 1000 mL via INTRAVENOUS
  Administered 2016-02-18: 08:00:00 via INTRAVENOUS

## 2016-02-18 MED ORDER — SODIUM CHLORIDE 0.9 % IV SOLN: INTRAVENOUS | Status: DC

## 2016-02-18 MED ORDER — FENTANYL CITRATE (PF) 100 MCG/2ML IJ SOLN
INTRAMUSCULAR | Status: AC
  Filled 2016-02-18: qty 2

## 2016-02-18 MED ORDER — DEXTROSE-NACL 5-0.9 % IV SOLN
INTRAVENOUS | Status: AC
Start: 2016-02-18 — End: 2016-02-18

## 2016-02-18 MED ORDER — SODIUM CHLORIDE 0.9 % IV SOLN
INTRAVENOUS | Status: DC | PRN
  Administered 2016-02-18: 09:00:00

## 2016-02-18 MED ORDER — ROCURONIUM BROMIDE 100 MG/10ML IV SOLN
INTRAVENOUS | Status: DC | PRN
  Administered 2016-02-18: 25 mg via INTRAVENOUS

## 2016-02-18 NOTE — Progress Notes (Signed)
[name] 8:20 AM  Subjective: Patient doing well without any pain and no new complaints and hospital computer chart reviewed and case discussed with Dr Randa Evens, patient and family  Objective: Vital signs stable, afebrile, no acute distress, exam please see pre assessment evaluation, CBC okay, bili decerased  Assessment: Probable CBD stone  Plan: The risk, benefits and success rate of ERCP discussed with patient and family including possible stent placement and will proceed this morning with anesthesia assistance  [facility]  Pager [phone] After 5PM or if no answer call [phone]

## 2016-02-18 NOTE — Anesthesia Postprocedure Evaluation (Signed)
Anesthesia Post Note  Patient: [name]  Procedure(s) Performed: Procedure(s) (LRB): ENDOSCOPIC RETROGRADE CHOLANGIOPANCREATOGRAPHY (ERCP) (N/A)  Patient location during evaluation: PACU Anesthesia Type: General Level of consciousness: awake and alert Pain management: pain level controlled Vital Signs Assessment: post-procedure vital signs reviewed and stable Respiratory status: spontaneous breathing, nonlabored ventilation, respiratory function stable and patient connected to nasal cannula oxygen Cardiovascular status: blood pressure returned to baseline and stable Postop Assessment: no signs of nausea or vomiting Anesthetic complications: no    Last Vitals:  Filed Vitals:   02/18/16 1000 02/18/16 1110  BP: 156/67 144/69  Pulse: 59 60  Temp:  36.8 C  Resp: 22 18    Last Pain:  Filed Vitals:   02/18/16 1247  PainSc: 6                  Nikodem Leadbetter DAVID

## 2016-02-18 NOTE — Progress Notes (Signed)
PROGRESS NOTE                                                                                                                                                                                                             Patient Demographics:    [name], is a 70 y.o. male, DOB - [birth year], OZH:[id number]  Admit date - 02/13/2016   Admitting Physician Briscoe Deutscher, MD  Outpatient Primary MD for the patient is CYNTHIA BUTLER, DO  LOS - 5   Chief Complaint  Patient presents with  . Fatigue       Brief Narrative -   [name] is a 70 y.o. male with medical history significant for CAD status post remote CABG, chronic combined CHF with AICD, iron deficiency anemia, type 2 diabetes mellitus, and paroxysmal atrial fibrillation not anticoagulated due to a GI bleed, TKR 1 yr ago and tolerated it, admitted for recurrent Cholangitis with Gall stones on [place], As initially seen by GI and general surgery at [facility] and subsequently transferred to [facility]. He underwent ERCP with sphincterotomy and CBD stone removal on 02/18/2016 by Dr. Ewing Schlein. General surgery has been called for eventual cholecystectomy likely on 02/19/2016.    Subjective:    [name] today has, No headache, No chest pain, +ve RUQ abdominal pain but much improved than before - No Nausea, No new weakness tingling or numbness, No Cough - SOB.     Assessment  & Plan :    1. Ascending cholangitis in a patient with history of gallstones. Evidence of choledocholithiasis on CT scan, on empiric antibiotic Unasyn, GI and Surgery following.   He was initially admitted to [facility] but on 02/15/2016 transferred to [facility] for further care, he underwent ERCP with sphincterotomy and CBD stone removal on 02/18/2016 by Dr. Ewing Schlein. General surgery has been called for eventual cholecystectomy likely on 02/19/2016. For now continue Unasyn  until he has his cholecystectomy.  2. 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 Currently compensated and chest pain-free, has AICD, continue beta blocker for secondary prevention and monitor. Last Plavix dose was 02/13/2016 at home, aspirin now discontinued by GI on 02/16/2016 cardiology following during this perioperative period. He definitely will be a high-risk candidate for surgery.  3. Prolonged QTC. On beta  blocker, provided 1 g of magnesium. Monitor. Avoid Zofran and other QTC prolonging agents.   4. Dyslipidemia. Statin on hold due to will enjoy to send elevated LFTs.  5. Chronic anemia. Outpatient follow-up. Monitor.   6. Essential hypertension. For now on Coreg. Diuretics on hold. Added as needed IV hydralazine and Lopressor.  7. P. Afib ChadVasc 2 score > 4 - on B blocker, not on anticoagulation due to H/O GIB, resume aspirin/Plavix post surgery/ERCP.  8. DM type II. Had episodes of hypoglycemia, since nothing by mouth Will monitor off insulin sliding scale and do every 6 CBGs.  Lab Results  Component Value Date   HGBA1C 7.3* 02/14/2016   CBG (last 3)   Recent Labs  02/17/16 1927 02/18/16 0102 02/18/16 0424  GLUCAP 116* 128* 121*      Code Status :  Full code  Family Communication  :  Family bedside  Disposition Plan  : Remain inpatient  Consults  : GI, Surg, Cards  Procedures  :    RUQ [place] - Gall stones  CT - CBD stone and Gallstones  ERCP with CBD stone extraction and sphincterotomy by Dr. Leary Roca on 02/18/2016  DVT Prophylaxis  :    Heparin    Lab Results  Component Value Date   PLT 170 02/18/2016    Inpatient Medications  Scheduled Meds: . ampicillin-sulbactam (UNASYN) IV  1.5 g Intravenous Q6H  . baclofen  20 mg Oral BID  . carvedilol  12.5 mg Oral BID WC  . ciprofloxacin-dexamethasone  3 drop Left Ear BID  . heparin  5,000 Units Subcutaneous Q8H  . loratadine  10 mg Oral Daily  .  metoCLOPramide (REGLAN) injection  10 mg Intravenous Q6H  . pneumococcal 23 valent vaccine  0.5 mL Intramuscular Tomorrow-1000   Continuous Infusions: . dextrose 5 % and 0.9% NaCl     PRN Meds:.acetaminophen **OR** [DISCONTINUED] acetaminophen, hydrALAZINE, HYDROmorphone (DILAUDID) injection, meclizine, metoprolol, nitroGLYCERIN, oxyCODONE-acetaminophen **AND** oxyCODONE  Antibiotics  :    Anti-infectives    Start     Dose/Rate Route Frequency Ordered Stop   02/15/16 0830  ampicillin-sulbactam (UNASYN) 1.5 g in sodium chloride 0.9 % 50 mL IVPB     1.5 g 100 mL/hr over 30 Minutes Intravenous Every 6 hours 02/15/16 0822     02/14/16 0930  piperacillin-tazobactam (ZOSYN) IVPB 3.375 g  Status:  Discontinued     3.375 g 12.5 mL/hr over 240 Minutes Intravenous Every 8 hours 02/14/16 0922 02/15/16 0822   02/13/16 1430  piperacillin-tazobactam (ZOSYN) IVPB 3.375 g     3.375 g 100 mL/hr over 30 Minutes Intravenous  Once 02/13/16 1417 02/13/16 1457         Objective:   Filed Vitals:   02/18/16 0940 02/18/16 0950 02/18/16 1000 02/18/16 1110  BP:  138/68 156/67 144/69  Pulse: 60 60 59 60  Temp:    98.3 F (36.8 C)  TempSrc:    Oral  Resp: 11 17 22 18   Height:      Weight:      SpO2: 98% 97% 98% 100%    Wt Readings from Last 3 Encounters:  02/18/16 94.62 kg (208 lb 9.6 oz)  02/12/16 96.163 kg (212 lb)  01/02/16 95.981 kg (211 lb 9.6 oz)     Intake/Output Summary (Last 24 hours) at 02/18/16 1208 Last data filed at 02/18/16 1112  Gross per 24 hour  Intake   1300 ml  Output   2376 ml  Net  -1076 ml  Physical Exam  Awake Alert, Oriented X 3, No new F.N deficits, Normal affect [place].AT,PERRAL Supple Neck,No JVD, No cervical lymphadenopathy appriciated.  Symmetrical Chest wall movement, Good air movement bilaterally, CTAB RRR,No Gallops,Rubs or new Murmurs, No Parasternal Heave +ve B.Sounds, Abd Soft, No tenderness, No organomegaly appriciated, No rebound - guarding or  rigidity. No Cyanosis, Clubbing or edema, No new Rash or bruise       Data Review:    CBC  Recent Labs Lab 02/13/16 1320 02/14/16 0559 02/15/16 0516 02/16/16 0809 02/17/16 0425 02/18/16 0216  WBC 7.5 4.6 5.2 4.1 3.9* 5.3  HGB 12.7* 12.5* 13.0 13.0 12.3* 12.1*  HCT 38.5* 38.2* 40.2 39.6 38.1* 37.5*  PLT 128* 110* 125* 144* 165 170  MCV 87.1 88.6 88.5 85.3 85.4 84.8  MCH 28.7 29.0 28.6 28.0 27.6 27.4  MCHC 33.0 32.7 32.3 32.8 32.3 32.3  RDW 18.1* 18.4* 18.3* 18.0* 18.4* 18.5*  LYMPHSABS 0.3* 0.3*  --   --   --   --   MONOABS 0.9 0.7  --   --   --   --   EOSABS 0.0 0.0  --   --   --   --   BASOSABS 0.0 0.0  --   --   --   --     Chemistries   Recent Labs Lab 02/13/16 1715 02/14/16 0559 02/15/16 0516 02/16/16 0809 02/17/16 0425 02/18/16 0216  NA  --  138 137 139 140 142  K  --  3.8 4.0 4.0 3.5 3.6  CL  --  108 106 106 105 110  CO2  --  26 24 24 24 24   GLUCOSE  --  82 104* 177* 131* 114*  BUN  --  17 12 7 6  <5*  CREATININE  --  1.09 0.90 0.89 0.90 0.75  CALCIUM  --  7.9* 8.0* 8.2* 8.2* 8.2*  MG 1.7  --   --  2.3  --   --   AST  --  125* 112* 70* 59* 86*  ALT  --  177* 155* 122* 99* 106*  ALKPHOS  --  152* 186* 273* 276* 247*  BILITOT  --  1.6* 2.9* 3.7* 4.3* 2.4*   ------------------------------------------------------------------------------------------------------------------ No results for input(s): CHOL, HDL, LDLCALC, TRIG, CHOLHDL, LDLDIRECT in the last 72 hours.  Lab Results  Component Value Date   HGBA1C 7.3* 02/14/2016   ------------------------------------------------------------------------------------------------------------------ No results for input(s): TSH, T4TOTAL, T3FREE, THYROIDAB in the last 72 hours.  Invalid input(s): FREET3 ------------------------------------------------------------------------------------------------------------------ No results for input(s): VITAMINB12, FOLATE, FERRITIN, TIBC, IRON, RETICCTPCT in the last 72  hours.  Coagulation profile  Recent Labs Lab 02/13/16 1320 02/14/16 1203  INR 1.18 1.17    No results for input(s): DDIMER in the last 72 hours.  Cardiac Enzymes  Recent Labs Lab 02/13/16 1320  TROPONINI <0.03   ------------------------------------------------------------------------------------------------------------------    Component Value Date/Time   BNP 391.0* 02/13/2016 1721    Micro Results No results found for this or any previous visit (from the past 240 hour(s)).  Radiology Reports Ct Abdomen W Contrast  02/15/2016  CLINICAL DATA:  Right upper quadrant pain, gallstones, nausea, evaluate for choledocholithiasis EXAM: CT ABDOMEN WITH CONTRAST TECHNIQUE: Multidetector CT imaging of the abdomen was performed using the standard protocol following bolus administration of intravenous contrast. CONTRAST:  100 mL Isovue 300 IV COMPARISON:  Abdominal ultrasound dated 02/13/2016. CT abdomen pelvis dated 03/06/2013. FINDINGS: Lower chest: Small bilateral pleural effusions. Mild patchy opacities in the bilateral lower lobes, likely  dependent atelectasis. Hepatobiliary: Liver is notable for mild periportal edema. No enhancing hepatic lesions. Mild gallbladder distention. Associated 2.8 cm gallstone. No associated gallbladder wall thickening or inflammatory changes. Dilated common duct, measuring 9 mm distally (series 4/image 26). Associated 7 mm distal CBD stone (series 7/image 22). Pancreas: Within normal limits. No peripancreatic fluid collections. Spleen: Within normal limits. Adrenals/Urinary Tract: Adrenal glands are within normal limits. Kidneys are within normal limits.  No hydronephrosis. Stomach/Bowel: Stomach is within normal limits. Visualized bowel is unremarkable. Vascular/Lymphatic: Atherosclerotic calcifications of the abdominal aorta. No evidence of abdominal aortic aneurysm. No suspicious abdominal lymphadenopathy. Other: No abdominal ascites. Musculoskeletal:  Degenerative changes of the visualized thoracolumbar spine. IMPRESSION: Cholelithiasis with mild gallbladder distention. No associated inflammatory changes to suggest acute cholecystitis. Dilated common duct, measuring 9 mm, with suspected 7 mm distal CBD stone. Small bilateral pleural effusions. These results were called by telephone at the time of interpretation on 02/15/2016 at 5:04 pm to Dr. Jonette Eva, who verbally acknowledged these results. Electronically Signed   By: Charline Bills M.D.   On: 02/15/2016 17:40   US Abdomen Complete  02/13/2016  CLINICAL DATA:  Acute generalized abdominal pain. EXAM: ABDOMEN ULTRASOUND COMPLETE COMPARISON:  CT scan of March 06, 2013. FINDINGS: Gallbladder: Multiple gallstones are noted with minimal gallbladder wall thickening measured at 4 mm, but no pericholecystic fluid or sonographic Murphy's sign is noted. The largest calculus measures 2.6 cm in size. Common bile duct: Diameter: 5 mm which is within normal limits. Liver: No focal lesion identified. Within normal limits in parenchymal echogenicity. IVC: No abnormality visualized. Pancreas: Not visualized due to overlying bowel gas. Spleen: Size and appearance within normal limits. Right Kidney: Length: 12.8 cm. Echogenicity within normal limits. No mass or hydronephrosis visualized. Left Kidney: Length: 11.7 cm. Echogenicity within normal limits. No mass or hydronephrosis visualized. Abdominal aorta: No aneurysm visualized. Other findings: None. IMPRESSION: Cholelithiasis is noted with minimal gallbladder wall thickening, but no pericholecystic fluid or sonographic Murphy's sign is noted. HIDA scan may be performed for further evaluation. Pancreas is not visualized due to overlying bowel gas. No other abnormality seen in the abdomen. Electronically Signed   By: Lupita Raider, M.D.   On: 02/13/2016 14:01   Dg Chest Port 1 View  02/15/2016  CLINICAL DATA:  Congestive heart failure EXAM: PORTABLE CHEST 1 VIEW  COMPARISON:  11/18/2013 FINDINGS: Cardiomegaly again noted. Status post CABG. Dual lead cardiac pacemaker is unchanged in position. No infiltrate or pleural effusion. No pulmonary edema. Surgical clips right supraclavicular region again noted. IMPRESSION: No active disease.  Cardiomegaly.  Status post CABG. Electronically Signed   By: Natasha Mead M.D.   On: 02/15/2016 13:36   Dg Ercp Biliary & Pancreatic Ducts  02/18/2016  CLINICAL DATA:  ERCP for a bile duct stone. EXAM: ERCP TECHNIQUE: Multiple spot images obtained with the fluoroscopic device and submitted for interpretation post-procedure. FLUOROSCOPY TIME:  If the device does not provide the exposure index: Fluoroscopy Time:  5 minutes and 19 seconds Number of Acquired Images:  3 COMPARISON:  Abdominal CT 02/15/2016 FINDINGS: Inflation of a balloon in the distal common bile duct with opacification of the common bile duct and central intrahepatic bile ducts. No large filling defects or stones. IMPRESSION: No large filling defects or bile duct stones on these images. These images were submitted for radiologic interpretation only. Please see the procedural report for the amount of contrast and the fluoroscopy time utilized. Electronically Signed   By: Meriel Pica.D.  On: 02/18/2016 09:45    Time Spent in minutes  30   SINGH,PRASHANT K M.D on 02/18/2016 at 12:08 PM  Between 7am to 7pm - Pager - [phone]  After 7pm go to [email or web address] - password [facility]  [facility] -  Office  [phone]

## 2016-02-18 NOTE — Transfer of Care (Signed)
Immediate Anesthesia Transfer of Care Note  Patient: [name]  Procedure(s) Performed: Procedure(s): ENDOSCOPIC RETROGRADE CHOLANGIOPANCREATOGRAPHY (ERCP) (N/A)   Patient Location: PACU and Endoscopy Unit  Anesthesia Type:General  Level of Consciousness: awake, alert , oriented and sedated  Airway & Oxygen Therapy: Patient Spontanous Breathing and Patient connected to nasal cannula oxygen  Post-op Assessment: Report given to RN, Post -op Vital signs reviewed and stable and Patient moving all extremities  Post vital signs: Reviewed and stable  Last Vitals:  Filed Vitals:   02/18/16 0749 02/18/16 0938  BP:  136/66  Pulse: 63 62  Temp: 36.6 C 36.8 C  Resp: 19 12    Last Pain:  Filed Vitals:   02/18/16 0939  PainSc: Asleep      Patients Stated Pain Goal: 0 (02/17/16 1810)  Complications: No apparent anesthesia complications

## 2016-02-18 NOTE — Op Note (Signed)
[facility] Patient Name: [name] Procedure Date : 02/18/2016 MRN: [id number] Attending MD: Vida Rigger , MD Date of Birth: January 27, 1946 CSN: [id number] Age: 70 Admit Type: Inpatient Procedure:                ERCP Indications:              Suspected bile duct stone(s) Providers:                Vida Rigger, MD, Dwain Sarna, RN, Harrington Challenger,                            Technician Referring MD:              Medicines:                General Anesthesia Complications:            No immediate complications. Estimated blood loss:                            Minimal. Estimated Blood Loss:     Estimated blood loss was minimal. Procedure:                Pre-Anesthesia Assessment:                           - Prior to the procedure, a History and Physical                            was performed, and patient medications and                            allergies were reviewed. The patient's tolerance of                            previous anesthesia was also reviewed. The risks                            and benefits of the procedure and the sedation                            options and risks were discussed with the patient.                            All questions were answered, and informed consent                            was obtained. Prior Anticoagulants: The patient has                            taken heparin, last dose was day of procedure. ASA                            Grade Assessment: III - A patient with severe                            systemic  disease. After reviewing the risks and                            benefits, the patient was deemed in satisfactory                            condition to undergo the procedure.                           After obtaining informed consent, the scope was                            passed under direct vision. Throughout the                            procedure, the patient's blood pressure, pulse, and       oxygen saturations were monitored continuously. The                            [id number]) scope was introduced through                            the mouth, and used to inject contrast into and                            used to locate the major papilla. The ERCP was                            accomplished without difficulty. The patient                            tolerated the procedure well. Scope In: Scope Out: Findings:      The major papilla was normal. Biliary sphincterotomy was made with a       Hydratome sphincterotome using ERBE electrocautery after cannulation of       the cystic duct since we were unable at this time to get CBD cannulation       despite a prolonged attempts with the wire.the sphincterotomy was       increased until we could get a fully bowed sphincterotome easily in and       out of the duct and Very minimal bleeding was seen. Choledocholithiasis       was found in a minimally dilated duct. once the sphincterotomy was done       and we were able to reposition the sphincterotome and get deep selective       cannulation and the wire was advanced into the intrahepatics . The upper       third of the main bile duct contained one stone, which was medium-sized       in diameter. The biliary tree was swept with a 12 mm balloon starting at       the bifurcation. One stone was removed. No stones remained on 3       subsequent balloon pull-throughs and then we proceeded with an occlusion       cholangiogram in the customary fashion without any additional findings  and there was sluggish but adequate biliary drainage and there was no       pancreatic duct injection or wire advancement throughout the procedure       and the wire and the balloon and endoscope were removed and the patient       tolerated the procedure well there was no obvious immediate complication. Impression:               - The major papilla appeared normal.                           -  Choledocholithiasis was found. Complete removal                            was accomplished by biliary sphincterotomy and                            balloon extraction using adjustable ballooninflated                            to 12 mm.                           - A biliary sphincterotomy was performed.                           - The biliary tree was swept. Moderate Sedation:      moderate sedation-none Recommendation:           - Clear liquid diet today.                           - Continue present medications. can resume heparin                            subcutaneous later today and aspirin or Plavix                            tomorrow and if he truly needs both going forward                            would resume the other in a few days                           - Return patient to hospital ward for ongoing care.                           - Return to GI clinic PRN.                           - Telephone GI clinic if symptomatic PRN. surgical                            consult to determine if cholecystectomy is                            warranted  at this time Procedure Code(s):        --- Professional ---                           [phone], Endoscopic retrograde                            cholangiopancreatography (ERCP); with removal of                            calculi/debris from biliary/pancreatic duct(s) Diagnosis Code(s):        --- Professional ---                           K80.50, Calculus of bile duct without cholangitis                            or cholecystitis without obstruction CPT copyright 2016 [employer] Medical Association. All rights reserved. The codes documented in this report are preliminary and upon coder review may  be revised to meet current compliance requirements. Vida Rigger, MD 02/18/2016 9:36:58 AM This report has been signed electronically. Number of Addenda: 0

## 2016-02-18 NOTE — Anesthesia Preprocedure Evaluation (Signed)
Anesthesia Evaluation  Patient identified by MRN, date of birth, ID band Patient awake    Reviewed: Allergy & Precautions, NPO status , Patient's Chart, lab work & pertinent test results  Airway Mallampati: II  TM Distance: >3 FB Neck ROM: Full    Dental  (+) Edentulous Upper, Edentulous Lower   Pulmonary neg shortness of breath, neg sleep apnea, neg COPD, neg recent URI, former smoker,    breath sounds clear to auscultation       Cardiovascular hypertension, Pt. on medications (-) angina+ CAD, + Past MI, + Peripheral Vascular Disease and +CHF  + Cardiac Defibrillator + Valvular Problems/Murmurs MR  Rhythm:Regular     Neuro/Psych Right sided weakness  Neuromuscular disease CVA, Residual Symptoms negative psych ROS   GI/Hepatic Neg liver ROS,   Endo/Other  diabetes, Type 2, Oral Hypoglycemic Agents  Renal/GU      Musculoskeletal   Abdominal   Peds  Hematology   Anesthesia Other Findings   Reproductive/Obstetrics                             Anesthesia Physical Anesthesia Plan  ASA: III  Anesthesia Plan: General   Post-op Pain Management:    Induction: Intravenous  Airway Management Planned: Oral ETT  Additional Equipment: None  Intra-op Plan:   Post-operative Plan: Extubation in OR  Informed Consent: I have reviewed the patients History and Physical, chart, labs and discussed the procedure including the risks, benefits and alternatives for the proposed anesthesia with the patient or authorized representative who has indicated his/her understanding and acceptance.   Dental advisory given  Plan Discussed with: CRNA and Surgeon  Anesthesia Plan Comments: (Magnet available, a paced)        Anesthesia Quick Evaluation

## 2016-02-18 NOTE — Consult Note (Signed)
Reason for Consult:choledocholithiasis  Referring Physician: Dr. Lala Lund   HPI: [name] is a 70 year old male with a history of ischemic CMP, sCHF, ICD, CAD s/p CABG, atrial fibrillation, DM II.  He presented to [facility] with a several day history of abdominal pain.  Associated with nausea.  Location to the epigastrium without radiation.  Denies any fever or chills prior to admission.  Abdominal US showed cholelithiasis.  WBC was normal.  LFTs were elevated with a TB of 2.1.  He was evaluated by gastroenterology and felt like he needed an ERCP.  He was therefore transferred to [facility].  Prior to transfer he was also evaluated by cardiology and general surgery. He underwent a ERCP today with stone extraction, sphincterotomy which he tolerated well.  He was admitted for acute cholecystitis in 2014 and treated by Dr. Arnoldo Morale with antibiotics.  He reports a severe history requiring hospitalization about 5 years, but I was unable to find records of this.  At present time, pain has resolved.  He is tolerating clears. LFTs remain elevated. WBC remains normal.   We have been asked to evaluate for consideration of laparoscopic cholecystectomy.    Past Medical History  Diagnosis Date  . Sinus bradycardia   . Tobacco abuse   . Chronic renal insufficiency   . Osteoarthritis   . Diabetes mellitus   . Mitral regurgitation   . Systolic CHF, chronic (HCC)     Previously taken off lisinopril by primary doctor due to labs  . Carotid stenosis     a. Prior carotid dz/ surgery. b. Carotid dopp 07/2011 - 0-39% bilaterally.;   c. Doppler 11/13: 56-38% RICA, 9-37% LICA  . Cardiomyopathy, ischemic     a. Chronic systolic CHF s/p [employer] Jude dual chamber ICD 03/2008.  [name] Hx of CABG   . CAD (coronary artery disease)     a. STEMI s/p CABG 07/2007 (had preop cardiogenic shock, IABP, VDRF before surgery)  . Stroke [facility])     a. CVA 1990s - chronic pain in RUE after stroke.  [name] PAF (paroxysmal atrial fibrillation) ([place])      a. Poor Coumadin candidate due to hx of GI bleed.  . GI bleed     Reported history  . Splenomegaly   . Myocardial infarction ([place])   . Automatic implantable cardioverter-defibrillator in situ   . Hypertension     [place] [facility]   . Sleep apnea     study done, 10 yrs ago, unable to tolerate CPAP  . Neuromuscular disorder ([place])     R sided weakness   . Anemia     Multifactorial  - hx of B12 def and iron deficiency, [facility]    Past Surgical History  Procedure Laterality Date  . Coronary artery bypass graft    . Percutaneous coronary intervention  01/10/2005    Eustace Quail, MD  . Coronary artery bypass graft  08/29/2007    x3; Valentina Gu. Roxy Manns MD  . Cardiac defibrillator placement  04/05/2008    Implantation of a St. Jude dual-chamber defibrillator, Champ Mungo. [name] , MD  . Cardiac defibrillator placement    . Tonsillectomy    . Joint replacement  1990's    L knee  . Total knee arthroplasty Right 08/16/2013    Procedure: RIGHT TOTAL KNEE ARTHROPLASTY;  Surgeon: Mcarthur Rossetti, MD;  Location: [place];  Service: Orthopedics;  Laterality: Right;  . Ep implantable device N/A 10/05/2015  Procedure: ICD Generator Changeout;  Surgeon: Evans Lance, MD;  Location: [place] [facility];  Service: Cardiovascular;  Laterality: N/A;  . Esophagogastroduodenoscopy  07/2007    Rourk: small hh, atrophic gastric mucosa, poor distensibility of stomach ?extrinsic compression, CT showed mild to moderate splenomegaly. No etiology for IDA.  [name] Colonoscopy  07/2007    Rourk: left-sided diverticulum. TI normal. No etiology for IDA.    Family History  Problem Relation Age of Onset  . Heart attack Mother     CVA, MI  . Diabetes Mother   . Heart attack Father     MI  . Diabetes Sister   . Coronary artery disease Brother     CABG    Social History:  reports that he quit smoking about 25 years ago. He quit smokeless tobacco use about 27 years  ago. He reports that he does not drink alcohol or use illicit drugs.  Allergies:  Allergies  Allergen Reactions  . Codeine Nausea And Vomiting    Medications:  Scheduled Meds: . ampicillin-sulbactam (UNASYN) IV  1.5 g Intravenous Q6H  . baclofen  20 mg Oral BID  . carvedilol  12.5 mg Oral BID WC  . ciprofloxacin-dexamethasone  3 drop Left Ear BID  . heparin  5,000 Units Subcutaneous Q8H  . loratadine  10 mg Oral Daily  . metoCLOPramide (REGLAN) injection  10 mg Intravenous Q6H  . pneumococcal 23 valent vaccine  0.5 mL Intramuscular Tomorrow-1000   Continuous Infusions: . dextrose 5 % and 0.9% NaCl 50 mL/hr at 02/18/16 1145   PRN Meds:.acetaminophen **OR** [DISCONTINUED] acetaminophen, hydrALAZINE, HYDROmorphone (DILAUDID) injection, meclizine, metoprolol, nitroGLYCERIN, oxyCODONE-acetaminophen **AND** oxyCODONE   Results for orders placed or performed during the hospital encounter of 02/13/16 (from the past 48 hour(s))  Glucose, capillary     Status: Abnormal   Collection Time: 02/16/16  4:26 PM  Result Value Ref Range   Glucose-Capillary 163 (H) 65 - 99 mg/dL  Glucose, capillary     Status: Abnormal   Collection Time: 02/16/16  8:29 PM  Result Value Ref Range   Glucose-Capillary 164 (H) 65 - 99 mg/dL  Glucose, capillary     Status: Abnormal   Collection Time: 02/17/16  1:19 AM  Result Value Ref Range   Glucose-Capillary 144 (H) 65 - 99 mg/dL   Comment 1 Notify RN    Comment 2 Call MD NNP PA CNM    Comment 3 Document in Chart   CBC     Status: Abnormal   Collection Time: 02/17/16  4:25 AM  Result Value Ref Range   WBC 3.9 (L) 4.0 - 10.5 K/uL   RBC 4.46 4.22 - 5.81 MIL/uL   Hemoglobin 12.3 (L) 13.0 - 17.0 g/dL   HCT 38.1 (L) 39.0 - 52.0 %   MCV 85.4 78.0 - 100.0 fL   MCH 27.6 26.0 - 34.0 pg   MCHC 32.3 30.0 - 36.0 g/dL   RDW 18.4 (H) 11.5 - 15.5 %   Platelets 165 150 - 400 K/uL  Comprehensive metabolic panel     Status: Abnormal   Collection Time: 02/17/16  4:25  AM  Result Value Ref Range   Sodium 140 135 - 145 mmol/L   Potassium 3.5 3.5 - 5.1 mmol/L   Chloride 105 101 - 111 mmol/L   CO2 24 22 - 32 mmol/L   Glucose, Bld 131 (H) 65 - 99 mg/dL   BUN 6 6 - 20 mg/dL   Creatinine, Ser 0.90 0.61 - 1.24  mg/dL   Calcium 8.2 (L) 8.9 - 10.3 mg/dL   Total Protein 5.3 (L) 6.5 - 8.1 g/dL   Albumin 2.4 (L) 3.5 - 5.0 g/dL   AST 59 (H) 15 - 41 U/L   ALT 99 (H) 17 - 63 U/L   Alkaline Phosphatase 276 (H) 38 - 126 U/L   Total Bilirubin 4.3 (H) 0.3 - 1.2 mg/dL   GFR calc non Af Amer >60 >60 mL/min   GFR calc Af Amer >60 >60 mL/min    Comment: (NOTE) The eGFR has been calculated using the CKD EPI equation. This calculation has not been validated in all clinical situations. eGFR's persistently <60 mL/min signify possible Chronic Kidney Disease.    Anion gap 11 5 - 15  Glucose, capillary     Status: Abnormal   Collection Time: 02/17/16  5:44 AM  Result Value Ref Range   Glucose-Capillary 132 (H) 65 - 99 mg/dL  Glucose, capillary     Status: Abnormal   Collection Time: 02/17/16  6:04 AM  Result Value Ref Range   Glucose-Capillary 137 (H) 65 - 99 mg/dL  Glucose, capillary     Status: Abnormal   Collection Time: 02/17/16  8:09 AM  Result Value Ref Range   Glucose-Capillary 123 (H) 65 - 99 mg/dL  Glucose, capillary     Status: Abnormal   Collection Time: 02/17/16 12:13 PM  Result Value Ref Range   Glucose-Capillary 113 (H) 65 - 99 mg/dL  Glucose, capillary     Status: None   Collection Time: 02/17/16  4:29 PM  Result Value Ref Range   Glucose-Capillary 97 65 - 99 mg/dL   Comment 1 Notify RN   Glucose, capillary     Status: Abnormal   Collection Time: 02/17/16  7:27 PM  Result Value Ref Range   Glucose-Capillary 116 (H) 65 - 99 mg/dL  Glucose, capillary     Status: Abnormal   Collection Time: 02/18/16  1:02 AM  Result Value Ref Range   Glucose-Capillary 128 (H) 65 - 99 mg/dL   Comment 1 Notify RN   CBC     Status: Abnormal   Collection Time:  02/18/16  2:16 AM  Result Value Ref Range   WBC 5.3 4.0 - 10.5 K/uL   RBC 4.42 4.22 - 5.81 MIL/uL   Hemoglobin 12.1 (L) 13.0 - 17.0 g/dL   HCT 37.5 (L) 39.0 - 52.0 %   MCV 84.8 78.0 - 100.0 fL   MCH 27.4 26.0 - 34.0 pg   MCHC 32.3 30.0 - 36.0 g/dL   RDW 18.5 (H) 11.5 - 15.5 %   Platelets 170 150 - 400 K/uL  Comprehensive metabolic panel     Status: Abnormal   Collection Time: 02/18/16  2:16 AM  Result Value Ref Range   Sodium 142 135 - 145 mmol/L   Potassium 3.6 3.5 - 5.1 mmol/L   Chloride 110 101 - 111 mmol/L   CO2 24 22 - 32 mmol/L   Glucose, Bld 114 (H) 65 - 99 mg/dL   BUN <5 (L) 6 - 20 mg/dL   Creatinine, Ser 0.75 0.61 - 1.24 mg/dL   Calcium 8.2 (L) 8.9 - 10.3 mg/dL   Total Protein 5.2 (L) 6.5 - 8.1 g/dL   Albumin 2.6 (L) 3.5 - 5.0 g/dL   AST 86 (H) 15 - 41 U/L   ALT 106 (H) 17 - 63 U/L   Alkaline Phosphatase 247 (H) 38 - 126 U/L   Total Bilirubin 2.4 (H)  0.3 - 1.2 mg/dL   GFR calc non Af Amer >60 >60 mL/min   GFR calc Af Amer >60 >60 mL/min    Comment: (NOTE) The eGFR has been calculated using the CKD EPI equation. This calculation has not been validated in all clinical situations. eGFR's persistently <60 mL/min signify possible Chronic Kidney Disease.    Anion gap 8 5 - 15  Glucose, capillary     Status: Abnormal   Collection Time: 02/18/16  4:24 AM  Result Value Ref Range   Glucose-Capillary 121 (H) 65 - 99 mg/dL   Comment 1 Notify RN   Glucose, capillary     Status: Abnormal   Collection Time: 02/18/16 11:55 AM  Result Value Ref Range   Glucose-Capillary 119 (H) 65 - 99 mg/dL    Dg Ercp Biliary & Pancreatic Ducts  02/18/2016  CLINICAL DATA:  ERCP for a bile duct stone. EXAM: ERCP TECHNIQUE: Multiple spot images obtained with the fluoroscopic device and submitted for interpretation post-procedure. FLUOROSCOPY TIME:  If the device does not provide the exposure index: Fluoroscopy Time:  5 minutes and 19 seconds Number of Acquired Images:  3 COMPARISON:  Abdominal  CT 02/15/2016 FINDINGS: Inflation of a balloon in the distal common bile duct with opacification of the common bile duct and central intrahepatic bile ducts. No large filling defects or stones. IMPRESSION: No large filling defects or bile duct stones on these images. These images were submitted for radiologic interpretation only. Please see the procedural report for the amount of contrast and the fluoroscopy time utilized. Electronically Signed   By: Richarda Overlie M.D.   On: 02/18/2016 09:45    Review of Systems  Constitutional: Negative for fever, chills, weight loss, malaise/fatigue and diaphoresis.  Respiratory: Negative for cough, hemoptysis, sputum production, shortness of breath and wheezing.   Cardiovascular: Negative for chest pain, palpitations, orthopnea, claudication, leg swelling and PND.  Gastrointestinal: Positive for abdominal pain. Negative for heartburn, nausea, vomiting, diarrhea, constipation, blood in stool and melena.  Genitourinary: Negative for dysuria, urgency, frequency, hematuria and flank pain.  Neurological: Negative for dizziness, tingling, tremors, sensory change, speech change, focal weakness, seizures, loss of consciousness and weakness.   Blood pressure 144/69, pulse 60, temperature 98.3 F (36.8 C), temperature source Oral, resp. rate 18, height 6\' 1"  (1.854 m), weight 94.62 kg (208 lb 9.6 oz), SpO2 100 %. Physical Exam  Constitutional: He is oriented to person, place, and time. He appears well-developed and well-nourished. No distress.  Cardiovascular: Normal rate, regular rhythm and intact distal pulses.  Exam reveals no gallop and no friction rub.   Murmur heard. Respiratory: Effort normal and breath sounds normal. No respiratory distress. He has no wheezes. He has no rales. He exhibits no tenderness.  GI: Soft. Bowel sounds are normal. He exhibits no distension and no mass. There is no tenderness. There is no rebound and no guarding.  Musculoskeletal: Normal  range of motion. He exhibits no edema or tenderness.  Neurological: He is alert and oriented to person, place, and time.  Skin: Skin is warm. No rash noted. He is not diaphoretic. No erythema. No pallor.  Psychiatric: He has a normal mood and affect. His behavior is normal. Judgment and thought content normal.    Assessment/Plan: [name] is a 70 year old male with a history of ischemic CMP, sCHF, ICD, CAD s/p CABG, atrial fibrillation and DM II admitted with ascending cholangitis and choledocholithiasis.  He was hospitalized in 2014 with acute cholecystitis and managed non  operatively.  Given now second and possibly third, although, no records available for the initial episode, he would benefit from a laparoscopic cholecystectomy with IOC.  Cardiology has been following the patient and has deemed the patient an intermediate risk for surgery.  Will likely proceed with the surgery tomorrow.  Dr. Ninfa Linden to further discuss with the patient and final recommendations to follow.  Please continue to hold plavix. Thank you for the consult.    Erby Pian 02/18/2016, 12:57 PM

## 2016-02-18 NOTE — Anesthesia Procedure Notes (Signed)
Procedure Name: Intubation Date/Time: 02/18/2016 8:41 AM Performed by: Fransisca Kaufmann Pre-anesthesia Checklist: Patient identified, Emergency Drugs available, Suction available and Patient being monitored Patient Re-evaluated:Patient Re-evaluated prior to inductionOxygen Delivery Method: Circle System Utilized Preoxygenation: Pre-oxygenation with 100% oxygen Intubation Type: IV induction Ventilation: Mask ventilation without difficulty Laryngoscope Size: Mac and 4 Tube type: Oral Tube size: 7.5 mm Number of attempts: 1 Airway Equipment and Method: Stylet and Oral airway Placement Confirmation: ETT inserted through vocal cords under direct vision,  positive ETCO2 and breath sounds checked- equal and bilateral Secured at: 23 cm Tube secured with: Tape Dental Injury: Teeth and Oropharynx as per pre-operative assessment

## 2016-02-19 ENCOUNTER — Encounter (HOSPITAL_COMMUNITY)
Admission: EM | Disposition: A | Discharge status: Home Health | Payer: Self-pay | Source: Home / Self Care | Attending: Internal Medicine

## 2016-02-19 ENCOUNTER — Inpatient Hospital Stay (HOSPITAL_COMMUNITY): Payer: Medicare HMO | Admitting: Anesthesiology

## 2016-02-19 DIAGNOSIS — Z951 Presence of aortocoronary bypass graft: Secondary | ICD-10-CM

## 2016-02-19 HISTORY — PX: CHOLECYSTECTOMY: SHX55

## 2016-02-19 LAB — COMPREHENSIVE METABOLIC PANEL
ALBUMIN: 2.6 g/dL — AB (ref 3.5–5.0)
ALT: 124 U/L — ABNORMAL HIGH (ref 17–63)
ANION GAP: 6 (ref 5–15)
AST: 109 U/L — AB (ref 15–41)
Alkaline Phosphatase: 238 U/L — ABNORMAL HIGH (ref 38–126)
CHLORIDE: 106 mmol/L (ref 101–111)
CO2: 27 mmol/L (ref 22–32)
Calcium: 8.3 mg/dL — ABNORMAL LOW (ref 8.9–10.3)
Creatinine, Ser: 0.86 mg/dL (ref 0.61–1.24)
GFR calc Af Amer: >60 mL/min (ref >60)
GFR calc non Af Amer: >60 mL/min (ref >60)
GLUCOSE: 94 mg/dL (ref 65–99)
POTASSIUM: 3.5 mmol/L (ref 3.5–5.1)
SODIUM: 139 mmol/L (ref 135–145)
Total Bilirubin: 1.8 mg/dL — ABNORMAL HIGH (ref 0.3–1.2)
Total Protein: 5.1 g/dL — ABNORMAL LOW (ref 6.5–8.1)

## 2016-02-19 LAB — GLUCOSE, CAPILLARY
GLUCOSE-CAPILLARY: 86 mg/dL (ref 65–99)
GLUCOSE-CAPILLARY: 107 mg/dL — AB (ref 65–99)
GLUCOSE-CAPILLARY: 146 mg/dL — AB (ref 65–99)
Glucose-Capillary: 97 mg/dL (ref 65–99)
Glucose-Capillary: 132 mg/dL — ABNORMAL HIGH (ref 65–99)
Glucose-Capillary: 222 mg/dL — ABNORMAL HIGH (ref 65–99)

## 2016-02-19 LAB — CBC WITH DIFFERENTIAL/PLATELET
BASOS ABS: 0 10*3/uL (ref 0.0–0.1)
BASOS PCT: 0 %
EOS ABS: 0.1 10*3/uL (ref 0.0–0.7)
EOS PCT: 2 %
HCT: 37.2 % — ABNORMAL LOW (ref 39.0–52.0)
Hemoglobin: 11.8 g/dL — ABNORMAL LOW (ref 13.0–17.0)
Lymphocytes Relative: 26 %
Lymphs Abs: 1.5 10*3/uL (ref 0.7–4.0)
MCH: 27.1 pg (ref 26.0–34.0)
MCHC: 31.7 g/dL (ref 30.0–36.0)
MCV: 85.5 fL (ref 78.0–100.0)
MONO ABS: 0.5 10*3/uL (ref 0.1–1.0)
Monocytes Relative: 9 %
NEUTROS ABS: 3.7 10*3/uL (ref 1.7–7.7)
Neutrophils Relative %: 63 %
PLATELETS: 195 10*3/uL (ref 150–400)
RBC: 4.35 MIL/uL (ref 4.22–5.81)
RDW: 19.2 % — AB (ref 11.5–15.5)
WBC: 5.9 10*3/uL (ref 4.0–10.5)

## 2016-02-19 SURGERY — LAPAROSCOPIC CHOLECYSTECTOMY
Anesthesia: General | Site: Abdomen

## 2016-02-19 MED ORDER — BUPIVACAINE-EPINEPHRINE (PF) 0.25% -1:200000 IJ SOLN
INTRAMUSCULAR | Status: AC
  Filled 2016-02-19: qty 30

## 2016-02-19 MED ORDER — PROPOFOL 10 MG/ML IV BOLUS
INTRAVENOUS | Status: AC
  Filled 2016-02-19: qty 20

## 2016-02-19 MED ORDER — ONDANSETRON HCL 4 MG/2ML IJ SOLN
INTRAMUSCULAR | Status: DC | PRN
  Administered 2016-02-19: 4 mg via INTRAVENOUS

## 2016-02-19 MED ORDER — LABETALOL HCL 5 MG/ML IV SOLN
INTRAVENOUS | Status: DC | PRN
  Administered 2016-02-19: 2.5 mg via INTRAVENOUS

## 2016-02-19 MED ORDER — SUGAMMADEX SODIUM 200 MG/2ML IV SOLN
INTRAVENOUS | Status: DC | PRN
  Administered 2016-02-19: 200 mg via INTRAVENOUS

## 2016-02-19 MED ORDER — LACTATED RINGERS IV SOLN
INTRAVENOUS | Status: DC | PRN
  Administered 2016-02-19: 10:00:00 via INTRAVENOUS

## 2016-02-19 MED ORDER — MORPHINE SULFATE (PF) 2 MG/ML IV SOLN: 1.0000 mg | INTRAVENOUS | Status: DC | PRN

## 2016-02-19 MED ORDER — OXYCODONE HCL 5 MG PO TABS
5.0000 mg | ORAL_TABLET | Freq: Once | ORAL | Status: AC | PRN
  Administered 2016-02-19: 5 mg via ORAL

## 2016-02-19 MED ORDER — HEMOSTATIC AGENTS (NO CHARGE) OPTIME
TOPICAL | Status: DC | PRN
  Administered 2016-02-19: 1

## 2016-02-19 MED ORDER — PROPOFOL 10 MG/ML IV BOLUS
INTRAVENOUS | Status: DC | PRN
  Administered 2016-02-19: 30 mg via INTRAVENOUS
  Administered 2016-02-19: 50 mg via INTRAVENOUS
  Administered 2016-02-19: 70 mg via INTRAVENOUS

## 2016-02-19 MED ORDER — OXYCODONE HCL 5 MG PO TABS
ORAL_TABLET | ORAL | Status: AC
  Filled 2016-02-19: qty 1

## 2016-02-19 MED ORDER — BUPIVACAINE-EPINEPHRINE 0.25% -1:200000 IJ SOLN
INTRAMUSCULAR | Status: DC | PRN
  Administered 2016-02-19: 20 mL

## 2016-02-19 MED ORDER — DEXTROSE-NACL 5-0.9 % IV SOLN
INTRAVENOUS | Status: AC
  Administered 2016-02-19 (×2): via INTRAVENOUS

## 2016-02-19 MED ORDER — SUGAMMADEX SODIUM 200 MG/2ML IV SOLN
INTRAVENOUS | Status: AC
  Filled 2016-02-19: qty 2

## 2016-02-19 MED ORDER — FENTANYL CITRATE (PF) 100 MCG/2ML IJ SOLN
INTRAMUSCULAR | Status: DC | PRN
  Administered 2016-02-19 (×3): 50 ug via INTRAVENOUS

## 2016-02-19 MED ORDER — ROCURONIUM BROMIDE 100 MG/10ML IV SOLN
INTRAVENOUS | Status: DC | PRN
  Administered 2016-02-19: 10 mg via INTRAVENOUS
  Administered 2016-02-19: 40 mg via INTRAVENOUS

## 2016-02-19 MED ORDER — DEXTROSE-NACL 5-0.9 % IV SOLN: INTRAVENOUS | Status: DC

## 2016-02-19 MED ORDER — HYDROMORPHONE HCL 1 MG/ML IJ SOLN
INTRAMUSCULAR | Status: AC
  Filled 2016-02-19: qty 1

## 2016-02-19 MED ORDER — LACTATED RINGERS IV SOLN
INTRAVENOUS | Status: DC
Start: 2016-02-19 — End: 2016-02-21
  Administered 2016-02-19: 10 mL/h via INTRAVENOUS
  Administered 2016-02-19: 10:00:00 via INTRAVENOUS

## 2016-02-19 MED ORDER — IOPAMIDOL (ISOVUE-300) INJECTION 61%
INTRAVENOUS | Status: DC | PRN
  Administered 2016-02-19: 1 mL

## 2016-02-19 MED ORDER — HYDROMORPHONE HCL 1 MG/ML IJ SOLN
0.2500 mg | INTRAMUSCULAR | Status: DC | PRN
  Administered 2016-02-19 (×4): 0.5 mg via INTRAVENOUS

## 2016-02-19 MED ORDER — 0.9 % SODIUM CHLORIDE (POUR BTL) OPTIME
TOPICAL | Status: DC | PRN
  Administered 2016-02-19: 1000 mL

## 2016-02-19 MED ORDER — METOCLOPRAMIDE HCL 5 MG/ML IJ SOLN: 10.0000 mg | Freq: Once | INTRAMUSCULAR | Status: DC | PRN

## 2016-02-19 MED ORDER — PHENYLEPHRINE HCL 10 MG/ML IJ SOLN
INTRAMUSCULAR | Status: DC | PRN
  Administered 2016-02-19: 80 ug via INTRAVENOUS

## 2016-02-19 MED ORDER — LABETALOL HCL 5 MG/ML IV SOLN
INTRAVENOUS | Status: AC
  Filled 2016-02-19: qty 4

## 2016-02-19 MED ORDER — SODIUM CHLORIDE 0.9 % IR SOLN
Status: DC | PRN
  Administered 2016-02-19: 1000 mL

## 2016-02-19 MED ORDER — LIDOCAINE HCL (CARDIAC) 20 MG/ML IV SOLN
INTRAVENOUS | Status: DC | PRN
  Administered 2016-02-19: 100 mg via INTRAVENOUS

## 2016-02-19 MED ORDER — FUROSEMIDE 10 MG/ML IJ SOLN
20.0000 mg | Freq: Once | INTRAMUSCULAR | Status: AC
  Administered 2016-02-19: 20 mg via INTRAVENOUS
  Filled 2016-02-19: qty 2

## 2016-02-19 MED ORDER — ONDANSETRON HCL 4 MG/2ML IJ SOLN
INTRAMUSCULAR | Status: AC
  Filled 2016-02-19: qty 2

## 2016-02-19 MED ORDER — OXYCODONE HCL 5 MG/5ML PO SOLN: 5.0000 mg | Freq: Once | ORAL | Status: AC | PRN

## 2016-02-19 MED ORDER — POTASSIUM CHLORIDE 20 MEQ/15ML (10%) PO SOLN
40.0000 meq | Freq: Once | ORAL | Status: AC
  Administered 2016-02-19: 40 meq via ORAL
  Filled 2016-02-19: qty 30

## 2016-02-19 MED ORDER — FENTANYL CITRATE (PF) 250 MCG/5ML IJ SOLN
INTRAMUSCULAR | Status: AC
  Filled 2016-02-19: qty 5

## 2016-02-19 MED ORDER — IOPAMIDOL (ISOVUE-300) INJECTION 61%
INTRAVENOUS | Status: AC
  Filled 2016-02-19: qty 50

## 2016-02-19 SURGICAL SUPPLY — 36 items
APPLIER CLIP 5 13 M/L LIGAMAX5 (MISCELLANEOUS) ×4
APR CLP MED LRG 5 ANG JAW (MISCELLANEOUS) ×2
BAG SPEC RTRVL LRG 6X4 10 (ENDOMECHANICALS) ×2
BLADE SURG CLIPPER 3M 9600 (MISCELLANEOUS) ×3 IMPLANT
CANISTER SUCTION 2500CC (MISCELLANEOUS) ×4 IMPLANT
CHLORAPREP W/TINT 26ML (MISCELLANEOUS) ×4 IMPLANT
CLIP APPLIE 5 13 M/L LIGAMAX5 (MISCELLANEOUS) ×2 IMPLANT
COVER MAYO STAND STRL (DRAPES) ×4 IMPLANT
COVER SURGICAL LIGHT HANDLE (MISCELLANEOUS) ×4 IMPLANT
DRAPE C-ARM 42X72 X-RAY (DRAPES) ×4 IMPLANT
ELECT REM PT RETURN 9FT ADLT (ELECTROSURGICAL) ×4
ELECTRODE REM PT RTRN 9FT ADLT (ELECTROSURGICAL) ×2 IMPLANT
GLOVE SURG SIGNA 7.5 PF LTX (GLOVE) ×4 IMPLANT
GOWN STRL REUS W/ TWL LRG LVL3 (GOWN DISPOSABLE) ×4 IMPLANT
GOWN STRL REUS W/ TWL XL LVL3 (GOWN DISPOSABLE) ×2 IMPLANT
GOWN STRL REUS W/TWL LRG LVL3 (GOWN DISPOSABLE) ×8
GOWN STRL REUS W/TWL XL LVL3 (GOWN DISPOSABLE) ×4
KIT BASIN OR (CUSTOM PROCEDURE TRAY) ×4 IMPLANT
KIT ROOM TURNOVER OR (KITS) ×4 IMPLANT
LIQUID BAND (GAUZE/BANDAGES/DRESSINGS) ×7 IMPLANT
NS IRRIG 1000ML POUR BTL (IV SOLUTION) ×4 IMPLANT
PAD ARMBOARD 7.5X6 YLW CONV (MISCELLANEOUS) ×4 IMPLANT
POUCH SPECIMEN RETRIEVAL 10MM (ENDOMECHANICALS) ×4 IMPLANT
SCISSORS LAP 5X35 DISP (ENDOMECHANICALS) ×4 IMPLANT
SET CHOLANGIOGRAPH 5 50 .035 (SET/KITS/TRAYS/PACK) ×4 IMPLANT
SET IRRIG TUBING LAPAROSCOPIC (IRRIGATION / IRRIGATOR) ×4 IMPLANT
SLEEVE ENDOPATH XCEL 5M (ENDOMECHANICALS) ×8 IMPLANT
SPECIMEN JAR SMALL (MISCELLANEOUS) ×4 IMPLANT
SUT ETHIBOND NAB CT1 #1 30IN (SUTURE) ×3 IMPLANT
SUT MON AB 4-0 PC3 18 (SUTURE) ×4 IMPLANT
TOWEL OR 17X24 6PK STRL BLUE (TOWEL DISPOSABLE) ×4 IMPLANT
TOWEL OR 17X26 10 PK STRL BLUE (TOWEL DISPOSABLE) ×4 IMPLANT
TRAY LAPAROSCOPIC MC (CUSTOM PROCEDURE TRAY) ×4 IMPLANT
TROCAR XCEL BLUNT TIP 100MML (ENDOMECHANICALS) ×4 IMPLANT
TROCAR XCEL NON-BLD 5MMX100MML (ENDOMECHANICALS) ×4 IMPLANT
TUBING INSUFFLATION (TUBING) ×4 IMPLANT

## 2016-02-19 NOTE — Progress Notes (Addendum)
Spoke with Arlys John states Duncan Dull should be on her way over or calling us soon. Dr Gentry Roch informed.

## 2016-02-19 NOTE — Op Note (Signed)
NAMEJERID, [name] [name]:  [id number]  MEDICAL RECORD [name]:  [id number]  LOCATION:                                 FACILITY:  PHYSICIAN:  Abigail Miyamoto, M.D. DATE OF BIRTH:  [birth year]  DATE OF PROCEDURE:  02/19/2016 DATE OF DISCHARGE:                              OPERATIVE REPORT   PREOPERATIVE DIAGNOSIS:  Cholangitis.  POSTOPERATIVE DIAGNOSIS:  Cholangitis.  PROCEDURE:  Laparoscopic cholecystectomy.  SURGEON:  Abigail Miyamoto, M.D.  ANESTHESIA:  General and 0.25% Marcaine.  ESTIMATED BLOOD LOSS:  Minimal.  INDICATIONS:  This is a 70 year old gentleman with significant cardiac history, who presented with cholangitis.  He underwent an ERCP and had a stone removed from his common bile duct.  He now presents for cholecystectomy.  FINDINGS:  The patient was found to have an acutely inflamed, necrotic- appearing gallbladder with omentum stuck to it.  I decided to forego cholangiogram as the gallbladder was so necrotic.  I also repaired a small umbilical hernia with a #1 Ethibond suture.  PROCEDURE IN DETAIL:  The patient was brought to the operating room, identified as [name].  He was placed supine on the operating room table and general anesthesia was induced.  His abdomen was then prepped and draped in usual sterile fashion.  I made a small vertical incision above the umbilicus.  I carried this down to the hernia defect, which I then used to gain entrance into the peritoneal cavity.  I then placed a 0 Vicryl pursestring suture around the fascial opening.  The Hasson port was placed through the opening and insufflation of the abdomen was begun.  I placed a 5-mm port in the patient's epigastrium and two more in the right upper quadrant all under direct vision.  The gallbladder was found to be covered with omentum.  I was able to grasp it and retract it above the liver bed and slowly peeled the omentum off the gallbladder with the use of the  electrocautery.  I then was able to dissect it out at the base of the gallbladder.  The gallbladder itself was quite necrotic and kept tearing into pieces.  I was able to dissect it out the cystic duct and achieve a critical window around it.  I decided to forego cholangiogram as everything seemed to be tearing easily.  I was able to clip the cystic duct three times proximally, once distally, and transected.  The cystic artery and the posterior branch was identified, clipped proximally, distally, and transected as well. The gallbladder was slowly dissected free from the liver bed with the electrocautery.  Once it was free from liver bed, I placed an Endosac and removed it through the incision at the umbilicus.  Then with the aid of a piece of surgical snow and cautery, I was able to achieve hemostasis in the gallbladder fossa.  At this point, I copiously irrigated the abdomen with normal saline.  Hemostasis appeared to be achieved.  I then removed all ports under direct vision and deflated the abdomen.  I then removed the 0 Vicryl at the umbilicus and closed the facial defect with a #1 figure-of-eight Ethibond suture.  I then anesthetized all incisions  with Marcaine and closed all skin incisions with 4-0 Monocryl.  Skin glue was then applied.  The patient tolerated the procedure well.  All the counts were correct at the end of procedure.  The patient was then extubated in the operating room and taken in a stable condition to the recovery room.     Abigail Miyamoto, M.D.   ______________________________ Abigail Miyamoto, M.D.    DB/MEDQ  D:  02/19/2016  T:  02/19/2016  Job:  [id number]

## 2016-02-19 NOTE — Progress Notes (Signed)
[employer] called and informed of pt here for surgery.States they will page Arlys John.

## 2016-02-19 NOTE — Progress Notes (Signed)
Patient ID: [name], male   DOB: 31-Oct-1945, 70 y.o.   MRN: [id number] [facility] Progress Note  TRYSON MAGAW 70 y.o. 03/26/1946   Subjective: Feels ok. Minimal to no abd pain overnight. Multiple family members in room.  Objective: Vital signs in last 24 hours: Filed Vitals:   02/19/16 0118 02/19/16 0457  BP: 129/66 139/67  Pulse: 60 62  Temp: 97.3 F (36.3 C) 97.9 F (36.6 C)  Resp: 18 18    Physical Exam: Gen: lethargic, no acute distress CV: RRR Chest: CTA anteriorly Abd: minimal epigastric tenderness without guarding, otherwise nontender, soft, nondistended, +BS  Lab Results:  Recent Labs  02/18/16 0216 02/19/16 0318  NA 142 139  K 3.6 3.5  CL 110 106  CO2 24 27  GLUCOSE 114* 94  BUN <5* <5*  CREATININE 0.75 0.86  CALCIUM 8.2* 8.3*    Recent Labs  02/18/16 0216 02/19/16 0318  AST 86* 109*  ALT 106* 124*  ALKPHOS 247* 238*  BILITOT 2.4* 1.8*  PROT 5.2* 5.1*  ALBUMIN 2.6* 2.6*    Recent Labs  02/18/16 0216 02/19/16 0318  WBC 5.3 5.9  NEUTROABS  --  3.7  HGB 12.1* 11.8*  HCT 37.5* 37.2*  MCV 84.8 85.5  PLT 170 195    Recent Labs  02/18/16 1228  LABPROT 14.2  INR 1.08      Assessment/Plan: 70 yo s/p ERCP yesterday with CBD stone removal and plan for lap chole today. Doing well s/p ERCP.   Demyah Smyre C. 02/19/2016, 9:05 AM  Pager [phone]  If no answer or after 5 PM call [phone]

## 2016-02-19 NOTE — Progress Notes (Signed)
Attempted to interrogate patient's ICD.  Machine has been transmitting for >1 hour without success.  [employer] rep, Andrey Campanile, was called and will see patient at bedside.

## 2016-02-19 NOTE — Op Note (Signed)
LAPAROSCOPIC CHOLECYSTECTOMY  Procedure Note  [name] 02/13/2016 - 02/19/2016   Pre-op Diagnosis: cholangitis     Post-op Diagnosis: same  Procedure(s): LAPAROSCOPIC CHOLECYSTECTOMY  Surgeon(s): Abigail Miyamoto, MD  Anesthesia: General  Staff:  Circulator: Christene Slates, RN Radiology Technologist: Pauline Good; Leonides Grills, JAMES C Scrub Person: Guy Sandifer Barrett  Estimated Blood Loss: Minimal               Specimens: sent to path          [facility]   Date: 02/19/2016  Time: 11:56 AM

## 2016-02-19 NOTE — Progress Notes (Signed)
PROGRESS NOTE                                                                                                                                                                                                             Patient Demographics:    [name], is a 70 y.o. male, DOB - 1946/06/15, ZOX:[id number]  Admit date - 02/13/2016   Admitting Physician Briscoe Deutscher, MD  Outpatient Primary MD for the patient is CYNTHIA BUTLER, DO  LOS - 6   Chief Complaint  Patient presents with  . Fatigue       Brief Narrative -   [name] is a 70 y.o. male with medical history significant for CAD status post remote CABG, chronic combined CHF with AICD, iron deficiency anemia, type 2 diabetes mellitus, and paroxysmal atrial fibrillation not anticoagulated due to a GI bleed, TKR 1 yr ago and tolerated it, admitted for recurrent Cholangitis with Gall stones on [place], As initially seen by GI and general surgery at [facility] and subsequently transferred to [facility]. He underwent ERCP with sphincterotomy and CBD stone removal on 02/18/2016 by Dr. Ewing Schlein. General surgery has been called for eventual cholecystectomy on 02/19/2016.    Subjective:    [name] today has, No headache, No chest pain, No RUQ abdominal pain  - No Nausea, No new weakness tingling or numbness, No Cough - SOB.     Assessment  & Plan :    1. Ascending cholangitis in a patient with history of gallstones. Evidence of choledocholithiasis on CT scan, on empiric antibiotic Unasyn, GI and Surgery following.   He was initially admitted to [facility] but on 02/15/2016 transferred to [facility] for further care, he underwent ERCP with sphincterotomy and CBD stone removal on 02/18/2016 by Dr. Ewing Schlein. General surgery has been called for cholecystectomy on  02/19/2016. For now continue Unasyn until he has his cholecystectomy.  2. 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 Currently compensated and chest pain-free, has AICD, continue beta blocker for secondary prevention and monitor. Last Plavix dose was 02/13/2016 at home, aspirin now discontinued by GI on 02/16/2016 cardiology following during this perioperative period. He definitely will be a high-risk candidate for surgery.  3. Prolonged QTC. On beta blocker, provided 1 g of magnesium. Monitor.  Avoid Zofran and other QTC prolonging agents.   4. Dyslipidemia. Statin on hold due to will enjoy to send elevated LFTs.  5. Chronic anemia. Outpatient follow-up. Monitor.   6. Essential hypertension. For now on Coreg. Diuretics on hold. Added as needed IV hydralazine and Lopressor.  7. P. Afib ChadVasc 2 score > 4 - on B blocker, not on anticoagulation due to H/O GIB, resume aspirin/Plavix post surgery/ERCP.  8. DM type II. Had episodes of hypoglycemia, since nothing by mouth Will monitor off insulin sliding scale and do every 6 CBGs.  Lab Results  Component Value Date   HGBA1C 7.3* 02/14/2016   CBG (last 3)   Recent Labs  02/18/16 1938 02/19/16 0123 02/19/16 0813  GLUCAP 163* 97 107*      Code Status :  Full code  Family Communication  :  Family bedside  Disposition Plan  : Remain inpatient  Consults  : GI, Surg, Cards  Procedures  :    RUQ [place] - Gall stones  CT - CBD stone and Gallstones  ERCP with CBD stone extraction and sphincterotomy by Dr. Leary Roca on 02/18/2016  DVT Prophylaxis  :    Heparin    Lab Results  Component Value Date   PLT 195 02/19/2016    Inpatient Medications  Scheduled Meds: . ampicillin-sulbactam (UNASYN) IV  1.5 g Intravenous Q6H  . baclofen  20 mg Oral BID  . carvedilol  12.5 mg Oral BID WC  . ciprofloxacin-dexamethasone  3 drop Left Ear BID  . heparin  5,000 Units Subcutaneous Q8H  . loratadine   10 mg Oral Daily  . metoCLOPramide (REGLAN) injection  10 mg Intravenous Q6H  . pneumococcal 23 valent vaccine  0.5 mL Intramuscular Tomorrow-1000   Continuous Infusions: . dextrose 5 % and 0.9% NaCl 50 mL/hr at 02/19/16 0845   PRN Meds:.acetaminophen **OR** [DISCONTINUED] acetaminophen, hydrALAZINE, HYDROmorphone (DILAUDID) injection, meclizine, metoprolol, nitroGLYCERIN, oxyCODONE-acetaminophen **AND** oxyCODONE  Antibiotics  :    Anti-infectives    Start     Dose/Rate Route Frequency Ordered Stop   02/15/16 0830  ampicillin-sulbactam (UNASYN) 1.5 g in sodium chloride 0.9 % 50 mL IVPB     1.5 g 100 mL/hr over 30 Minutes Intravenous Every 6 hours 02/15/16 0822     02/14/16 0930  piperacillin-tazobactam (ZOSYN) IVPB 3.375 g  Status:  Discontinued     3.375 g 12.5 mL/hr over 240 Minutes Intravenous Every 8 hours 02/14/16 0922 02/15/16 0822   02/13/16 1430  piperacillin-tazobactam (ZOSYN) IVPB 3.375 g     3.375 g 100 mL/hr over 30 Minutes Intravenous  Once 02/13/16 1417 02/13/16 1457         Objective:   Filed Vitals:   02/18/16 1110 02/18/16 2057 02/19/16 0118 02/19/16 0457  BP: 144/69 135/64 129/66 139/67  Pulse: 60 58 60 62  Temp: 98.3 F (36.8 C) 97.8 F (36.6 C) 97.3 F (36.3 C) 97.9 F (36.6 C)  TempSrc: Oral Oral Oral Oral  Resp: 18 16 18 18   Height:      Weight:    95.528 kg (210 lb 9.6 oz)  SpO2: 100% 100% 98% 97%    Wt Readings from Last 3 Encounters:  02/19/16 95.528 kg (210 lb 9.6 oz)  02/12/16 96.163 kg (212 lb)  01/02/16 95.981 kg (211 lb 9.6 oz)     Intake/Output Summary (Last 24 hours) at 02/19/16 0927 Last data filed at 02/19/16 0850  Gross per 24 hour  Intake   1690 ml  Output  2051 ml  Net   -361 ml     Physical Exam  Awake Alert, Oriented X 3, No new F.N deficits, Normal affect [place].AT,PERRAL Supple Neck,No JVD, No cervical lymphadenopathy appriciated.  Symmetrical Chest wall movement, Good air movement bilaterally, CTAB RRR,No  Gallops,Rubs or new Murmurs, No Parasternal Heave +ve B.Sounds, Abd Soft, No tenderness, No organomegaly appriciated, No rebound - guarding or rigidity. No Cyanosis, Clubbing or edema, No new Rash or bruise       Data Review:    CBC  Recent Labs Lab 02/13/16 1320 02/14/16 0559 02/15/16 0516 02/16/16 0809 02/17/16 0425 02/18/16 0216 02/19/16 0318  WBC 7.5 4.6 5.2 4.1 3.9* 5.3 5.9  HGB 12.7* 12.5* 13.0 13.0 12.3* 12.1* 11.8*  HCT 38.5* 38.2* 40.2 39.6 38.1* 37.5* 37.2*  PLT 128* 110* 125* 144* 165 170 195  MCV 87.1 88.6 88.5 85.3 85.4 84.8 85.5  MCH 28.7 29.0 28.6 28.0 27.6 27.4 27.1  MCHC 33.0 32.7 32.3 32.8 32.3 32.3 31.7  RDW 18.1* 18.4* 18.3* 18.0* 18.4* 18.5* 19.2*  LYMPHSABS 0.3* 0.3*  --   --   --   --  1.5  MONOABS 0.9 0.7  --   --   --   --  0.5  EOSABS 0.0 0.0  --   --   --   --  0.1  BASOSABS 0.0 0.0  --   --   --   --  0.0    Chemistries   Recent Labs Lab 02/13/16 1715  02/15/16 0516 02/16/16 0809 02/17/16 0425 02/18/16 0216 02/19/16 0318  NA  --   < > 137 139 140 142 139  K  --   < > 4.0 4.0 3.5 3.6 3.5  CL  --   < > 106 106 105 110 106  CO2  --   < > GLUCOSE  --   < > 104* 177* 131* 114* 94  BUN  --   < > <5* <5*  CREATININE  --   < > 0.90 0.89 0.90 0.75 0.86  CALCIUM  --   < > 8.0* 8.2* 8.2* 8.2* 8.3*  MG 1.7  --   --  2.3  --   --   --   AST  --   < > 112* 70* 59* 86* 109*  ALT  --   < > 155* 122* 99* 106* 124*  ALKPHOS  --   < > 186* 273* 276* 247* 238*  BILITOT  --   < > 2.9* 3.7* 4.3* 2.4* 1.8*  < > = values in this interval not displayed. ------------------------------------------------------------------------------------------------------------------ No results for input(s): CHOL, HDL, LDLCALC, TRIG, CHOLHDL, LDLDIRECT in the last 72 hours.  Lab Results  Component Value Date   HGBA1C 7.3* 02/14/2016    ------------------------------------------------------------------------------------------------------------------ No results for input(s): TSH, T4TOTAL, T3FREE, THYROIDAB in the last 72 hours.  Invalid input(s): FREET3 ------------------------------------------------------------------------------------------------------------------ No results for input(s): VITAMINB12, FOLATE, FERRITIN, TIBC, IRON, RETICCTPCT in the last 72 hours.  Coagulation profile  Recent Labs Lab 02/13/16 1320 02/14/16 1203 02/18/16 1228  INR 1.18 1.17 1.08    No results for input(s): DDIMER in the last 72 hours.  Cardiac Enzymes  Recent Labs Lab 02/13/16 1320  TROPONINI <0.03   ------------------------------------------------------------------------------------------------------------------    Component Value Date/Time   BNP 391.0* 02/13/2016 1721    Micro Results No results found for this or any previous visit (from the past 240 hour(s)).  Radiology Reports  Ct Abdomen W Contrast  02/15/2016  CLINICAL DATA:  Right upper quadrant pain, gallstones, nausea, evaluate for choledocholithiasis EXAM: CT ABDOMEN WITH CONTRAST TECHNIQUE: Multidetector CT imaging of the abdomen was performed using the standard protocol following bolus administration of intravenous contrast. CONTRAST:  100 mL Isovue 300 IV COMPARISON:  Abdominal ultrasound dated 02/13/2016. CT abdomen pelvis dated 03/06/2013. FINDINGS: Lower chest: Small bilateral pleural effusions. Mild patchy opacities in the bilateral lower lobes, likely dependent atelectasis. Hepatobiliary: Liver is notable for mild periportal edema. No enhancing hepatic lesions. Mild gallbladder distention. Associated 2.8 cm gallstone. No associated gallbladder wall thickening or inflammatory changes. Dilated common duct, measuring 9 mm distally (series 4/image 26). Associated 7 mm distal CBD stone (series 7/image 22). Pancreas: Within normal limits. No peripancreatic fluid  collections. Spleen: Within normal limits. Adrenals/Urinary Tract: Adrenal glands are within normal limits. Kidneys are within normal limits.  No hydronephrosis. Stomach/Bowel: Stomach is within normal limits. Visualized bowel is unremarkable. Vascular/Lymphatic: Atherosclerotic calcifications of the abdominal aorta. No evidence of abdominal aortic aneurysm. No suspicious abdominal lymphadenopathy. Other: No abdominal ascites. Musculoskeletal: Degenerative changes of the visualized thoracolumbar spine. IMPRESSION: Cholelithiasis with mild gallbladder distention. No associated inflammatory changes to suggest acute cholecystitis. Dilated common duct, measuring 9 mm, with suspected 7 mm distal CBD stone. Small bilateral pleural effusions. These results were called by telephone at the time of interpretation on 02/15/2016 at 5:04 pm to Dr. Jonette Eva, who verbally acknowledged these results. Electronically Signed   By: Charline Bills M.D.   On: 02/15/2016 17:40   US Abdomen Complete  02/13/2016  CLINICAL DATA:  Acute generalized abdominal pain. EXAM: ABDOMEN ULTRASOUND COMPLETE COMPARISON:  CT scan of March 06, 2013. FINDINGS: Gallbladder: Multiple gallstones are noted with minimal gallbladder wall thickening measured at 4 mm, but no pericholecystic fluid or sonographic Murphy's sign is noted. The largest calculus measures 2.6 cm in size. Common bile duct: Diameter: 5 mm which is within normal limits. Liver: No focal lesion identified. Within normal limits in parenchymal echogenicity. IVC: No abnormality visualized. Pancreas: Not visualized due to overlying bowel gas. Spleen: Size and appearance within normal limits. Right Kidney: Length: 12.8 cm. Echogenicity within normal limits. No mass or hydronephrosis visualized. Left Kidney: Length: 11.7 cm. Echogenicity within normal limits. No mass or hydronephrosis visualized. Abdominal aorta: No aneurysm visualized. Other findings: None. IMPRESSION: Cholelithiasis is  noted with minimal gallbladder wall thickening, but no pericholecystic fluid or sonographic Murphy's sign is noted. HIDA scan may be performed for further evaluation. Pancreas is not visualized due to overlying bowel gas. No other abnormality seen in the abdomen. Electronically Signed   By: Lupita Raider, M.D.   On: 02/13/2016 14:01   Dg Chest Port 1 View  02/15/2016  CLINICAL DATA:  Congestive heart failure EXAM: PORTABLE CHEST 1 VIEW COMPARISON:  11/18/2013 FINDINGS: Cardiomegaly again noted. Status post CABG. Dual lead cardiac pacemaker is unchanged in position. No infiltrate or pleural effusion. No pulmonary edema. Surgical clips right supraclavicular region again noted. IMPRESSION: No active disease.  Cardiomegaly.  Status post CABG. Electronically Signed   By: Natasha Mead M.D.   On: 02/15/2016 13:36   Dg Ercp Biliary & Pancreatic Ducts  02/18/2016  CLINICAL DATA:  ERCP for a bile duct stone. EXAM: ERCP TECHNIQUE: Multiple spot images obtained with the fluoroscopic device and submitted for interpretation post-procedure. FLUOROSCOPY TIME:  If the device does not provide the exposure index: Fluoroscopy Time:  5 minutes and 19 seconds Number of Acquired Images:  3 COMPARISON:  Abdominal CT 02/15/2016 FINDINGS: Inflation of a balloon in the distal common bile duct with opacification of the common bile duct and central intrahepatic bile ducts. No large filling defects or stones. IMPRESSION: No large filling defects or bile duct stones on these images. These images were submitted for radiologic interpretation only. Please see the procedural report for the amount of contrast and the fluoroscopy time utilized. Electronically Signed   By: Richarda Overlie M.D.   On: 02/18/2016 09:45    Time Spent in minutes  30   Nalda Shackleford K M.D on 02/19/2016 at 9:27 AM  Between 7am to 7pm - Pager - [phone]  After 7pm go to [email or web address] - password [facility]  [facility] -  Office  [phone]

## 2016-02-19 NOTE — Progress Notes (Signed)
Patient ID: [name], male   DOB: [birth year], 70 y.o.   MRN: [id number]     [address] [place], [place] [address]    Phone: [phone] FAX: [phone]     Subjective: No questions. VSS.  Afebrile. NPO.  LFTs trending down.  Objective:  Vital signs:  Filed Vitals:   02/18/16 1110 02/18/16 2057 02/19/16 0118 02/19/16 0457  BP: 144/69 135/64 129/66 139/67  Pulse: 60 58 60 62  Temp: 98.3 F (36.8 C) 97.8 F (36.6 C) 97.3 F (36.3 C) 97.9 F (36.6 C)  TempSrc: Oral Oral Oral Oral  Resp: _0 Height:      Weight:    95.528 kg (210 lb 9.6 oz)  SpO2: 100% 100% 98% 97%    Last BM Date: 02/17/16  Intake/Output   Yesterday:  05/22 0701 - 05/23 0700 In: 9179 [P.O.:1160; I.V.:10; IV Piggyback:500] Out: 2626 [Urine:2625; Blood:1] This shift:  Total I/O In: -  Out: 250 [Urine:250]  Physical Exam: General: Pt awake/alert/oriented x4 in no acute distress   Problem List:   Principal Problem:   Biliary colic Active Problems:   DM (diabetes mellitus), secondary, with peripheral vascular complications (HCC)   Iron deficiency anemia   Essential hypertension   RENAL INSUFFICIENCY, CHRONIC   CORONARY ARTERY BYPASS GRAFT, HX OF   Elevated LFTs   Hypokalemia   Chronic combined systolic and diastolic CHF (congestive heart failure) (HCC)   Prolonged Q-T interval on ECG   Left ear pain   Thrombocytopenia (HCC)   CHF (congestive heart failure) ([place])    Results:   Labs: Results for orders placed or performed during the hospital encounter of 02/13/16 (from the past 48 hour(s))  Glucose, capillary     Status: Abnormal   Collection Time: 02/17/16 12:13 PM  Result Value Ref Range   Glucose-Capillary 113 (H) 65 - 99 mg/dL  Glucose, capillary     Status: None   Collection Time: 02/17/16  4:29 PM  Result Value Ref Range   Glucose-Capillary 97 65 - 99 mg/dL   Comment 1 Notify RN   Glucose,  capillary     Status: Abnormal   Collection Time: 02/17/16  7:27 PM  Result Value Ref Range   Glucose-Capillary 116 (H) 65 - 99 mg/dL  Glucose, capillary     Status: Abnormal   Collection Time: 02/18/16  1:02 AM  Result Value Ref Range   Glucose-Capillary 128 (H) 65 - 99 mg/dL   Comment 1 Notify RN   CBC     Status: Abnormal   Collection Time: 02/18/16  2:16 AM  Result Value Ref Range   WBC 5.3 4.0 - 10.5 K/uL   RBC 4.42 4.22 - 5.81 MIL/uL   Hemoglobin 12.1 (L) 13.0 - 17.0 g/dL   HCT 37.5 (L) 39.0 - 52.0 %   MCV 84.8 78.0 - 100.0 fL   MCH 27.4 26.0 - 34.0 pg   MCHC 32.3 30.0 - 36.0 g/dL   RDW 18.5 (H) 11.5 - 15.5 %   Platelets 170 150 - 400 K/uL  Comprehensive metabolic panel     Status: Abnormal   Collection Time: 02/18/16  2:16 AM  Result Value Ref Range   Sodium 142 135 - 145 mmol/L   Potassium 3.6 3.5 - 5.1 mmol/L   Chloride 110 101 - 111 mmol/L   CO2 24 22 - 32 mmol/L   Glucose, Bld  114 (H) 65 - 99 mg/dL   BUN <5 (L) 6 - 20 mg/dL   Creatinine, Ser 0.75 0.61 - 1.24 mg/dL   Calcium 8.2 (L) 8.9 - 10.3 mg/dL   Total Protein 5.2 (L) 6.5 - 8.1 g/dL   Albumin 2.6 (L) 3.5 - 5.0 g/dL   AST 86 (H) 15 - 41 U/L   ALT 106 (H) 17 - 63 U/L   Alkaline Phosphatase 247 (H) 38 - 126 U/L   Total Bilirubin 2.4 (H) 0.3 - 1.2 mg/dL   GFR calc non Af Amer >60 >60 mL/min   GFR calc Af Amer >60 >60 mL/min    Comment: (NOTE) The eGFR has been calculated using the CKD EPI equation. This calculation has not been validated in all clinical situations. eGFR's persistently <60 mL/min signify possible Chronic Kidney Disease.    Anion gap 8 5 - 15  Glucose, capillary     Status: Abnormal   Collection Time: 02/18/16  4:24 AM  Result Value Ref Range   Glucose-Capillary 121 (H) 65 - 99 mg/dL   Comment 1 Notify RN   Glucose, capillary     Status: Abnormal   Collection Time: 02/18/16 11:55 AM  Result Value Ref Range   Glucose-Capillary 119 (H) 65 - 99 mg/dL  Protime-INR     Status: None    Collection Time: 02/18/16 12:28 PM  Result Value Ref Range   Prothrombin Time 14.2 11.6 - 15.2 seconds   INR 1.08 0.00 - 1.49  Glucose, capillary     Status: Abnormal   Collection Time: 02/18/16  4:28 PM  Result Value Ref Range   Glucose-Capillary 156 (H) 65 - 99 mg/dL  Glucose, capillary     Status: Abnormal   Collection Time: 02/18/16  7:38 PM  Result Value Ref Range   Glucose-Capillary 163 (H) 65 - 99 mg/dL  Glucose, capillary     Status: None   Collection Time: 02/19/16  1:23 AM  Result Value Ref Range   Glucose-Capillary 97 65 - 99 mg/dL   Comment 1 Notify RN    Comment 2 Document in Chart   CBC with Differential/Platelet     Status: Abnormal   Collection Time: 02/19/16  3:18 AM  Result Value Ref Range   WBC 5.9 4.0 - 10.5 K/uL   RBC 4.35 4.22 - 5.81 MIL/uL   Hemoglobin 11.8 (L) 13.0 - 17.0 g/dL   HCT 37.2 (L) 39.0 - 52.0 %   MCV 85.5 78.0 - 100.0 fL   MCH 27.1 26.0 - 34.0 pg   MCHC 31.7 30.0 - 36.0 g/dL   RDW 19.2 (H) 11.5 - 15.5 %   Platelets 195 150 - 400 K/uL   Neutrophils Relative % 63 %   Neutro Abs 3.7 1.7 - 7.7 K/uL   Lymphocytes Relative 26 %   Lymphs Abs 1.5 0.7 - 4.0 K/uL   Monocytes Relative 9 %   Monocytes Absolute 0.5 0.1 - 1.0 K/uL   Eosinophils Relative 2 %   Eosinophils Absolute 0.1 0.0 - 0.7 K/uL   Basophils Relative 0 %   Basophils Absolute 0.0 0.0 - 0.1 K/uL  Comprehensive metabolic panel     Status: Abnormal   Collection Time: 02/19/16  3:18 AM  Result Value Ref Range   Sodium 139 135 - 145 mmol/L   Potassium 3.5 3.5 - 5.1 mmol/L   Chloride 106 101 - 111 mmol/L   CO2 27 22 - 32 mmol/L   Glucose, Bld 94 65 -  99 mg/dL   BUN <5 (L) 6 - 20 mg/dL   Creatinine, Ser 0.86 0.61 - 1.24 mg/dL   Calcium 8.3 (L) 8.9 - 10.3 mg/dL   Total Protein 5.1 (L) 6.5 - 8.1 g/dL   Albumin 2.6 (L) 3.5 - 5.0 g/dL   AST 109 (H) 15 - 41 U/L   ALT 124 (H) 17 - 63 U/L   Alkaline Phosphatase 238 (H) 38 - 126 U/L   Total Bilirubin 1.8 (H) 0.3 - 1.2 mg/dL   GFR calc  non Af Amer >60 >60 mL/min   GFR calc Af Amer >60 >60 mL/min    Comment: (NOTE) The eGFR has been calculated using the CKD EPI equation. This calculation has not been validated in all clinical situations. eGFR's persistently <60 mL/min signify possible Chronic Kidney Disease.    Anion gap 6 5 - 15  Glucose, capillary     Status: Abnormal   Collection Time: 02/19/16  8:13 AM  Result Value Ref Range   Glucose-Capillary 107 (H) 65 - 99 mg/dL    Imaging / Studies: Dg Ercp Biliary & Pancreatic Ducts  02/18/2016  CLINICAL DATA:  ERCP for a bile duct stone. EXAM: ERCP TECHNIQUE: Multiple spot images obtained with the fluoroscopic device and submitted for interpretation post-procedure. FLUOROSCOPY TIME:  If the device does not provide the exposure index: Fluoroscopy Time:  5 minutes and 19 seconds Number of Acquired Images:  3 COMPARISON:  Abdominal CT 02/15/2016 FINDINGS: Inflation of a balloon in the distal common bile duct with opacification of the common bile duct and central intrahepatic bile ducts. No large filling defects or stones. IMPRESSION: No large filling defects or bile duct stones on these images. These images were submitted for radiologic interpretation only. Please see the procedural report for the amount of contrast and the fluoroscopy time utilized. Electronically Signed   By: Markus Daft M.D.   On: 02/18/2016 09:45    Medications / Allergies:  Scheduled Meds: . ampicillin-sulbactam (UNASYN) IV  1.5 g Intravenous Q6H  . baclofen  20 mg Oral BID  . carvedilol  12.5 mg Oral BID WC  . ciprofloxacin-dexamethasone  3 drop Left Ear BID  . heparin  5,000 Units Subcutaneous Q8H  . loratadine  10 mg Oral Daily  . metoCLOPramide (REGLAN) injection  10 mg Intravenous Q6H  . pneumococcal 23 valent vaccine  0.5 mL Intramuscular Tomorrow-1000   Continuous Infusions: . dextrose 5 % and 0.9% NaCl 50 mL/hr at 02/19/16 0845   PRN Meds:.acetaminophen **OR** [DISCONTINUED] acetaminophen,  hydrALAZINE, HYDROmorphone (DILAUDID) injection, meclizine, metoprolol, nitroGLYCERIN, oxyCODONE-acetaminophen **AND** oxyCODONE  Antibiotics: Anti-infectives    Start     Dose/Rate Route Frequency Ordered Stop   02/15/16 0830  ampicillin-sulbactam (UNASYN) 1.5 g in sodium chloride 0.9 % 50 mL IVPB     1.5 g 100 mL/hr over 30 Minutes Intravenous Every 6 hours 02/15/16 0822     02/14/16 0930  piperacillin-tazobactam (ZOSYN) IVPB 3.375 g  Status:  Discontinued     3.375 g 12.5 mL/hr over 240 Minutes Intravenous Every 8 hours 02/14/16 0922 02/15/16 0822   02/13/16 1430  piperacillin-tazobactam (ZOSYN) IVPB 3.375 g     3.375 g 100 mL/hr over 30 Minutes Intravenous  Once 02/13/16 1417 02/13/16 1457        Assessment/Plan Ascending cholangitis Choledocholithiasis  To OR this AM for laparoscopic cholecystectomy with IOC, consent signed, Unasyn, NPO, IVF.   Erby Pian, [facility] Pager [phone]) For consults and floor pages call [phone](7A-4:30P)  02/19/2016 8:55 AM

## 2016-02-19 NOTE — Progress Notes (Signed)
[name] returned call and states Pacu can run a remote check if they need to, to assess the ICD after surgery, and that a magnet can be placed during surgery.

## 2016-02-19 NOTE — Progress Notes (Signed)
Placed on tele to monitor  

## 2016-02-19 NOTE — Anesthesia Postprocedure Evaluation (Signed)
Anesthesia Post Note  Patient: [name]  Procedure(s) Performed: Procedure(s) (LRB): LAPAROSCOPIC CHOLECYSTECTOMY (N/A)  Patient location during evaluation: PACU Anesthesia Type: General Level of consciousness: awake and alert Pain management: pain level controlled Vital Signs Assessment: post-procedure vital signs reviewed and stable Respiratory status: spontaneous breathing, nonlabored ventilation, respiratory function stable and patient connected to nasal cannula oxygen Cardiovascular status: blood pressure returned to baseline and stable Postop Assessment: no signs of nausea or vomiting Anesthetic complications: no    Last Vitals:  Filed Vitals:   02/19/16 1423 02/19/16 1436  BP:  140/73  Pulse:  60  Temp: 36.6 C 36.8 C  Resp:  18    Last Pain:  Filed Vitals:   02/19/16 1521  PainSc: 4                  Reino Kent

## 2016-02-19 NOTE — Transfer of Care (Signed)
Immediate Anesthesia Transfer of Care Note  Patient: ROLLA HAGIE  Procedure(s) Performed: Procedure(s): LAPAROSCOPIC CHOLECYSTECTOMY (N/A)  Patient Location: PACU  Anesthesia Type:General  Level of Consciousness: awake, alert  and oriented  Airway & Oxygen Therapy: Patient Spontanous Breathing and Patient connected to face mask oxygen  Post-op Assessment: Report given to RN, Post -op Vital signs reviewed and stable and Patient moving all extremities X 4  Post vital signs: Reviewed and stable  Last Vitals:  Filed Vitals:   02/19/16 0118 02/19/16 0457  BP: 129/66 139/67  Pulse: 60 62  Temp: 36.3 C 36.6 C  Resp: 18 18    Last Pain:  Filed Vitals:   02/19/16 0458  PainSc: 0-No pain      Patients Stated Pain Goal: 0 (02/17/16 1810)  Complications: No apparent anesthesia complications

## 2016-02-19 NOTE — Anesthesia Procedure Notes (Signed)
Procedure Name: Intubation Date/Time: 02/19/2016 11:03 AM Performed by: Carmela Rima Pre-anesthesia Checklist: Patient being monitored, Suction available, Emergency Drugs available, Patient identified and Timeout performed Patient Re-evaluated:Patient Re-evaluated prior to inductionOxygen Delivery Method: Circle system utilized Preoxygenation: Pre-oxygenation with 100% oxygen Intubation Type: IV induction Ventilation: Mask ventilation without difficulty Laryngoscope Size: Mac and 3 Grade View: Grade I Tube type: Oral Tube size: 7.5 mm Number of attempts: 1 Placement Confirmation: positive ETCO2,  ETT inserted through vocal cords under direct vision and breath sounds checked- equal and bilateral Secured at: 23 cm Tube secured with: Tape Dental Injury: Teeth and Oropharynx as per pre-operative assessment

## 2016-02-19 NOTE — Anesthesia Preprocedure Evaluation (Addendum)
Anesthesia Evaluation  Patient identified by MRN, date of birth, ID band Patient awake    Reviewed: Allergy & Precautions, NPO status , Patient's Chart, lab work & pertinent test results  Airway Mallampati: II  TM Distance: >3 FB Neck ROM: Full    Dental  (+) Edentulous Upper, Edentulous Lower   Pulmonary neg shortness of breath, neg sleep apnea, neg COPD, neg recent URI, former smoker,    breath sounds clear to auscultation       Cardiovascular hypertension, Pt. on medications (-) angina+ CAD, + Past MI, + CABG, + Peripheral Vascular Disease and +CHF  + Cardiac Defibrillator + Valvular Problems/Murmurs MR  Rhythm:Regular  Had CABG in 2008  EF 30% but he is compensated, has AICD which we will place magnet over during surgery, a paced  Cardiology acknowledged he is high risk for surgical periop MACE but no further work up needed to optimize patient from his baseline cardiac status   Neuro/Psych Right sided weakness  Neuromuscular disease CVA, Residual Symptoms negative psych ROS   GI/Hepatic Neg liver ROS,   Endo/Other  diabetes, Type 2, Oral Hypoglycemic Agents  Renal/GU      Musculoskeletal   Abdominal   Peds  Hematology   Anesthesia Other Findings   Reproductive/Obstetrics                            Anesthesia Physical  Anesthesia Plan  ASA: III  Anesthesia Plan: General   Post-op Pain Management:    Induction: Intravenous  Airway Management Planned: Oral ETT  Additional Equipment: Arterial line  Intra-op Plan:   Post-operative Plan: Extubation in OR  Informed Consent: I have reviewed the patients History and Physical, chart, labs and discussed the procedure including the risks, benefits and alternatives for the proposed anesthesia with the patient or authorized representative who has indicated his/her understanding and acceptance.   Dental advisory given  Plan Discussed  with: CRNA and Surgeon  Anesthesia Plan Comments: (Magnet available, a paced Will need A line)       Anesthesia Quick Evaluation

## 2016-02-20 ENCOUNTER — Encounter (HOSPITAL_COMMUNITY): Payer: Self-pay | Admitting: Surgery

## 2016-02-20 DIAGNOSIS — H04129 Dry eye syndrome of unspecified lacrimal gland: Secondary | ICD-10-CM | POA: Diagnosis present

## 2016-02-20 LAB — GLUCOSE, CAPILLARY
GLUCOSE-CAPILLARY: 111 mg/dL — AB (ref 65–99)
GLUCOSE-CAPILLARY: 149 mg/dL — AB (ref 65–99)
GLUCOSE-CAPILLARY: 157 mg/dL — AB (ref 65–99)
GLUCOSE-CAPILLARY: 171 mg/dL — AB (ref 65–99)
Glucose-Capillary: 119 mg/dL — ABNORMAL HIGH (ref 65–99)
Glucose-Capillary: 122 mg/dL — ABNORMAL HIGH (ref 65–99)

## 2016-02-20 LAB — CBC
HCT: 37.2 % — ABNORMAL LOW (ref 39.0–52.0)
Hemoglobin: 12.1 g/dL — ABNORMAL LOW (ref 13.0–17.0)
MCH: 28.1 pg (ref 26.0–34.0)
MCHC: 32.5 g/dL (ref 30.0–36.0)
MCV: 86.3 fL (ref 78.0–100.0)
PLATELETS: 188 10*3/uL (ref 150–400)
RBC: 4.31 MIL/uL (ref 4.22–5.81)
RDW: 19.3 % — ABNORMAL HIGH (ref 11.5–15.5)
WBC: 10.5 10*3/uL (ref 4.0–10.5)

## 2016-02-20 LAB — COMPREHENSIVE METABOLIC PANEL
ALT: 121 U/L — AB (ref 17–63)
AST: 101 U/L — AB (ref 15–41)
Albumin: 2.6 g/dL — ABNORMAL LOW (ref 3.5–5.0)
Alkaline Phosphatase: 207 U/L — ABNORMAL HIGH (ref 38–126)
Anion gap: 10 (ref 5–15)
BILIRUBIN TOTAL: 1.9 mg/dL — AB (ref 0.3–1.2)
BUN: 6 mg/dL (ref 6–20)
CO2: 25 mmol/L (ref 22–32)
CREATININE: 0.94 mg/dL (ref 0.61–1.24)
Calcium: 8.2 mg/dL — ABNORMAL LOW (ref 8.9–10.3)
Chloride: 102 mmol/L (ref 101–111)
GFR calc Af Amer: >60 mL/min (ref >60)
Glucose, Bld: 125 mg/dL — ABNORMAL HIGH (ref 65–99)
POTASSIUM: 3.8 mmol/L (ref 3.5–5.1)
Sodium: 137 mmol/L (ref 135–145)
TOTAL PROTEIN: 5 g/dL — AB (ref 6.5–8.1)

## 2016-02-20 MED ORDER — INSULIN ASPART 100 UNIT/ML ~~LOC~~ SOLN: 0.0000 [IU] | Freq: Every day | SUBCUTANEOUS | Status: DC

## 2016-02-20 MED ORDER — INSULIN ASPART 100 UNIT/ML ~~LOC~~ SOLN
0.0000 [IU] | Freq: Three times a day (TID) | SUBCUTANEOUS | Status: DC
  Administered 2016-02-20: 1 [IU] via SUBCUTANEOUS
  Administered 2016-02-21 (×2): 2 [IU] via SUBCUTANEOUS

## 2016-02-20 MED ORDER — EYE WASH OPHTH SOLN: 1.0000 [drp] | OPHTHALMIC | Status: DC | PRN

## 2016-02-20 MED ORDER — HYPROMELLOSE (GONIOSCOPIC) 2.5 % OP SOLN
1.0000 [drp] | Freq: Three times a day (TID) | OPHTHALMIC | Status: DC | PRN
  Administered 2016-02-20: 1 [drp] via OPHTHALMIC
  Filled 2016-02-20: qty 15

## 2016-02-20 NOTE — Progress Notes (Addendum)
[facility] Progress Note  Patient: [name] KCM:[id number]   PCP: [name], DO DOB: 1946-03-12   DOA: 02/13/2016   DOS: 02/20/2016   Date of Service: the patient was seen and examined on 02/20/2016  Subjective: The patient continues to have abdominal pain. Denies any nausea or vomiting. Has not had any bowel movement after surgery and also not passing gas. No chest pain or shortness of breath  Nutrition: was nothing by mouth   Brief hospital course: Patient  with past medical history of coronary artery disease status post CABG, chronic combined CHF with AICD, iron deficiency anemia, type 2 diabetes mellitus and A. fib not on any anticoagulation due to bleeding, was admitted on 02/13/2016, with complaint of  generalized weakness abdominal pain and nausea was found to have cholelithiasis without any evidence of cholecystitis. He underwent ERCP on May 22 with CBD stone removal. Underwent laparoscopic cholecystectomy on May 23. Cardiology was consulted for preoperative clearance. Currently further plan is continue postoperative recovery.  Assessment and Plan: 1. Biliary colic, Ascending cholangitis  Patient was initially started on empiric Unasyn GI and surgery was consulted. A status post ERCP and CBD stone removal May 22. Status post laparoscopic cholecystectomy May 23. Postoperatively patient does not have any acute complaint and therefore antibiotic will be discontinued. Diet is advanced per surgery today. Pain management as needed. When necessary Zofran. Physical therapy consult as well as ambulation.  2.history of coronary artery disease with CABG as well as chronic combined systolic and diastolic CHF, ejection fraction 30%. Has AICD. Resume aspirin today. Continue to hold Plavix until cleared by surgery Continue carvedilol No evidence of volume overload, in the hospital postoperatively, continue to hold Lasix and Aldactone for today.  3. Type 2 diabetes  mellitus. Holding oral hypoglycemic agent. Continue on sliding scale insulin. Hemoglobin A1c on admission 7.3.  4. Dyslipidemia. Statin on hold due to elevated LFT.  5.Atrial fibrillation CHA2DS2-VASc Score 4 Patient is currently rate controlled. We'll continue aspirin and Coreg Not on any anticoagulation due to prior history of GI bleed.  Pain management: Continue when necessary Percocet, and when necessary Dilaudid Continue scheduled baclofen Activity: Consulted physical therapy Bowel regimen: last BM 02/18/2016 Diet:  carb modified diet heart healthy DVT Prophylaxis: subcutaneous Heparin  Advance goals of care discussion: full code  Family Communication: family was present at bedside, at the time of interview. The pt provided permission to discuss medical plan with the family. Opportunity was given to ask question and all questions were answered satisfactorily.   Disposition:  Discharge to home, likely will need home health  Expected discharge date: 5/26, pending improvement in pain as well as diet tolerance  Consultants: Gastroenterology, general surgery  Procedures: ERCP May 22   laparoscopic cholecystectomy May 23  Antibiotics: Anti-infectives    Start     Dose/Rate Route Frequency Ordered Stop   02/15/16 0830  ampicillin-sulbactam (UNASYN) 1.5 g in sodium chloride 0.9 % 50 mL IVPB  Status:  Discontinued     1.5 g 100 mL/hr over 30 Minutes Intravenous Every 6 hours 02/15/16 0822 02/20/16 1144   02/14/16 0930  piperacillin-tazobactam (ZOSYN) IVPB 3.375 g  Status:  Discontinued     3.375 g 12.5 mL/hr over 240 Minutes Intravenous Every 8 hours 02/14/16 0922 02/15/16 0822   02/13/16 1430  piperacillin-tazobactam (ZOSYN) IVPB 3.375 g     3.375 g 100 mL/hr over 30 Minutes Intravenous  Once 02/13/16 1417 02/13/16 1457        Intake/Output Summary (  Last 24 hours) at 02/20/16 1242 Last data filed at 02/20/16 0900  Gross per 24 hour  Intake 2578.83 ml  Output   2025 ml   Net 553.83 ml   Filed Weights   02/18/16 0426 02/19/16 0457 02/20/16 0500  Weight: 94.62 kg (208 lb 9.6 oz) 95.528 kg (210 lb 9.6 oz) 92.851 kg (204 lb 11.2 oz)    Objective: Physical Exam: Filed Vitals:   02/19/16 2000 02/20/16 0005 02/20/16 0500 02/20/16 0900  BP: 119/63 104/55 129/71 114/52  Pulse: 70 60 64 63  Temp: 98.4 F (36.9 C) 98.4 F (36.9 C) 98.2 F (36.8 C) 98.5 F (36.9 C)  TempSrc: Oral Oral Oral Oral  Resp: 20 20 18 18   Height:      Weight:   92.851 kg (204 lb 11.2 oz)   SpO2: 95% 97% 97% 96%    General: Alert, Awake and Oriented to Time, Place and Person. Appear in mild distress Eyes: PERRL, Conjunctiva normal ENT: Oral Mucosa clear moist. Neck: no JVD, no Abnormal Mass Or lumps Cardiovascular: S1 and S2 Present, no Murmur, Peripheral Pulses Present Respiratory: Bilateral Air entry equal and Decreased, Clear to Auscultation, no Crackles, no wheezes Abdomen: Bowel Sound present, Soft and mild tenderness Skin: no redness, no Rash  Extremities: no Pedal edema, no calf tenderness Neurologic: Grossly no focal neuro deficit. Bilaterally Equal motor strength  Data Reviewed: CBC:  Recent Labs Lab 02/13/16 1320 02/14/16 0559  02/16/16 0809 02/17/16 0425 02/18/16 0216 02/19/16 0318 02/20/16 0320  WBC 7.5 4.6  < > 4.1 3.9* 5.3 5.9 10.5  NEUTROABS 6.3 3.6  --   --   --   --  3.7  --   HGB 12.7* 12.5*  < > 13.0 12.3* 12.1* 11.8* 12.1*  HCT 38.5* 38.2*  < > 39.6 38.1* 37.5* 37.2* 37.2*  MCV 87.1 88.6  < > 85.3 85.4 84.8 85.5 86.3  PLT 128* 110*  < > 144* 165 170 195 188  < > = values in this interval not displayed. Basic Metabolic Panel:  Recent Labs Lab 02/13/16 1715  02/16/16 0809 02/17/16 0425 02/18/16 0216 02/19/16 0318 02/20/16 0320  NA  --   < > 139 140 142 139 137  K  --   < > 4.0 3.5 3.6 3.5 3.8  CL  --   < > 106 105 110 106 102  CO2  --   < > 24 24 24 27 25   GLUCOSE  --   < > 177* 131* 114* 94 125*  BUN  --   < > 7 6 <5* <5* 6   CREATININE  --   < > 0.89 0.90 0.75 0.86 0.94  CALCIUM  --   < > 8.2* 8.2* 8.2* 8.3* 8.2*  MG 1.7  --  2.3  --   --   --   --   < > = values in this interval not displayed.  Liver Function Tests:  Recent Labs Lab 02/16/16 0809 02/17/16 0425 02/18/16 0216 02/19/16 0318 02/20/16 0320  AST 70* 59* 86* 109* 101*  ALT 122* 99* 106* 124* 121*  ALKPHOS 273* 276* 247* 238* 207*  BILITOT 3.7* 4.3* 2.4* 1.8* 1.9*  PROT 5.4* 5.3* 5.2* 5.1* 5.0*  ALBUMIN 2.6* 2.4* 2.6* 2.6* 2.6*    Recent Labs Lab 02/13/16 1320 02/14/16 0559 02/15/16 0516  LIPASE 16 16 <10*   No results for input(s): AMMONIA in the last 168 hours. Coagulation Profile:  Recent Labs Lab 02/13/16 1320  02/14/16 1203 02/18/16 1228  INR 1.18 1.17 1.08   Cardiac Enzymes:  Recent Labs Lab 02/13/16 1320  TROPONINI <0.03   BNP (last 3 results) No results for input(s): PROBNP in the last 8760 hours.  CBG:  Recent Labs Lab 02/19/16 1959 02/20/16 0003 02/20/16 0338 02/20/16 0815 02/20/16 1159  GLUCAP 222* 157* 119* 122* 171*    Studies: No results found.   Scheduled Meds: . baclofen  20 mg Oral BID  . carvedilol  12.5 mg Oral BID WC  . ciprofloxacin-dexamethasone  3 drop Left Ear BID  . heparin  5,000 Units Subcutaneous Q8H  . loratadine  10 mg Oral Daily  . metoCLOPramide (REGLAN) injection  10 mg Intravenous Q6H  . pneumococcal 23 valent vaccine  0.5 mL Intramuscular Tomorrow-1000   Continuous Infusions: . lactated ringers 10 mL/hr (02/19/16 1747)   PRN Meds: acetaminophen **OR** [DISCONTINUED] acetaminophen, hydrALAZINE, HYDROmorphone (DILAUDID) injection, hydroxypropyl methylcellulose / hypromellose, meclizine, metoprolol, nitroGLYCERIN, oxyCODONE-acetaminophen **AND** oxyCODONE  Time spent: 30 minutes  Author: Lynden Oxford, MD [facility] Pager: [phone] 02/20/2016 12:42 PM  If 7PM-7AM, please contact night-coverage at [email or web address], password [facility]

## 2016-02-20 NOTE — Progress Notes (Signed)
Spoke to Dr. Allena Katz concerning pt's constipation, with last BM 02/18/16.  He is aware, and states that it is impt that pt not strain r/t surgery this admission, and that his ambulation could produce some results.  Will continue to monitor.

## 2016-02-20 NOTE — Care Management Important Message (Signed)
Important Message  Patient Details  Name: [name] MRN: [id number] Date of Birth: September 15, 1946   Medicare Important Message Given:  Yes    Bernadette Hoit 02/20/2016, 11:19 AM

## 2016-02-20 NOTE — Progress Notes (Signed)
Patient ID: [name], male   DOB: Oct 03, 1945, 70 y.o.   MRN: [id number]     [address] [place], [place] [address]    Phone: [phone] FAX: [phone]     Subjective: No oob yet, says he too sore.  Discussed pain meds and importance of mobilizing. Tolerating liquids. No flatus. No n/v.  LFTs up but stable. Afebrile.   Objective:  Vital signs:  Filed Vitals:   02/19/16 2000 02/20/16 0005 02/20/16 0500 02/20/16 0900  BP: 119/63 104/55 129/71 114/52  Pulse: 70 60 64 63  Temp: 98.4 F (36.9 C) 98.4 F (36.9 C) 98.2 F (36.8 C) 98.5 F (36.9 C)  TempSrc: Oral Oral Oral Oral  Resp: _0 Height:      Weight:   92.851 kg (204 lb 11.2 oz)   SpO2: 95% 97% 97% 96%    Last BM Date: 02/17/16  Intake/Output   Yesterday:  05/23 0701 - 05/24 0700 In: 3048.8 [P.O.:370; I.V.:2428.8; IV Piggyback:250] Out: 2925 [Urine:2900; Blood:25] This shift:  Total I/O In: 360 [P.O.:360] Out: 200 [Urine:200]   Physical Exam: General: Pt awake/alert/oriented x4 in no acute distress  Abdomen: Soft.  Nondistended.   Mildly tender at incisions only.  Incisions are c/d/i.  No evidence of peritonitis.  No incarcerated hernias.    Problem List:   Principal Problem:   Biliary colic Active Problems:   DM (diabetes mellitus), secondary, with peripheral vascular complications (HCC)   Iron deficiency anemia   Essential hypertension   RENAL INSUFFICIENCY, CHRONIC   CORONARY ARTERY BYPASS GRAFT, HX OF   Elevated LFTs   Hypokalemia   Chronic combined systolic and diastolic CHF (congestive heart failure) (HCC)   Prolonged Q-T interval on ECG   Left ear pain   Thrombocytopenia (HCC)   CHF (congestive heart failure) ([place])    Results:   Labs: Results for orders placed or performed during the hospital encounter of 02/13/16 (from the past 48 hour(s))  Glucose, capillary     Status: Abnormal   Collection  Time: 02/18/16 11:55 AM  Result Value Ref Range   Glucose-Capillary 119 (H) 65 - 99 mg/dL  Protime-INR     Status: None   Collection Time: 02/18/16 12:28 PM  Result Value Ref Range   Prothrombin Time 14.2 11.6 - 15.2 seconds   INR 1.08 0.00 - 1.49  Glucose, capillary     Status: Abnormal   Collection Time: 02/18/16  4:28 PM  Result Value Ref Range   Glucose-Capillary 156 (H) 65 - 99 mg/dL  Glucose, capillary     Status: Abnormal   Collection Time: 02/18/16  7:38 PM  Result Value Ref Range   Glucose-Capillary 163 (H) 65 - 99 mg/dL  Glucose, capillary     Status: None   Collection Time: 02/19/16  1:23 AM  Result Value Ref Range   Glucose-Capillary 97 65 - 99 mg/dL   Comment 1 Notify RN    Comment 2 Document in Chart   CBC with Differential/Platelet     Status: Abnormal   Collection Time: 02/19/16  3:18 AM  Result Value Ref Range   WBC 5.9 4.0 - 10.5 K/uL   RBC 4.35 4.22 - 5.81 MIL/uL   Hemoglobin 11.8 (L) 13.0 - 17.0 g/dL   HCT 37.2 (L) 39.0 - 52.0 %   MCV 85.5 78.0 - 100.0 fL   MCH 27.1 26.0 - 34.0 pg  MCHC 31.7 30.0 - 36.0 g/dL   RDW 19.2 (H) 11.5 - 15.5 %   Platelets 195 150 - 400 K/uL   Neutrophils Relative % 63 %   Neutro Abs 3.7 1.7 - 7.7 K/uL   Lymphocytes Relative 26 %   Lymphs Abs 1.5 0.7 - 4.0 K/uL   Monocytes Relative 9 %   Monocytes Absolute 0.5 0.1 - 1.0 K/uL   Eosinophils Relative 2 %   Eosinophils Absolute 0.1 0.0 - 0.7 K/uL   Basophils Relative 0 %   Basophils Absolute 0.0 0.0 - 0.1 K/uL  Comprehensive metabolic panel     Status: Abnormal   Collection Time: 02/19/16  3:18 AM  Result Value Ref Range   Sodium 139 135 - 145 mmol/L   Potassium 3.5 3.5 - 5.1 mmol/L   Chloride 106 101 - 111 mmol/L   CO2 27 22 - 32 mmol/L   Glucose, Bld 94 65 - 99 mg/dL   BUN <5 (L) 6 - 20 mg/dL   Creatinine, Ser 0.86 0.61 - 1.24 mg/dL   Calcium 8.3 (L) 8.9 - 10.3 mg/dL   Total Protein 5.1 (L) 6.5 - 8.1 g/dL   Albumin 2.6 (L) 3.5 - 5.0 g/dL   AST 109 (H) 15 - 41 U/L    ALT 124 (H) 17 - 63 U/L   Alkaline Phosphatase 238 (H) 38 - 126 U/L   Total Bilirubin 1.8 (H) 0.3 - 1.2 mg/dL   GFR calc non Af Amer >60 >60 mL/min   GFR calc Af Amer >60 >60 mL/min    Comment: (NOTE) The eGFR has been calculated using the CKD EPI equation. This calculation has not been validated in all clinical situations. eGFR's persistently <60 mL/min signify possible Chronic Kidney Disease.    Anion gap 6 5 - 15  Glucose, capillary     Status: None   Collection Time: 02/19/16  4:51 AM  Result Value Ref Range   Glucose-Capillary 86 65 - 99 mg/dL   Comment 1 Notify RN    Comment 2 Document in Chart   Glucose, capillary     Status: Abnormal   Collection Time: 02/19/16  8:13 AM  Result Value Ref Range   Glucose-Capillary 107 (H) 65 - 99 mg/dL  Glucose, capillary     Status: Abnormal   Collection Time: 02/19/16 12:16 PM  Result Value Ref Range   Glucose-Capillary 132 (H) 65 - 99 mg/dL  Glucose, capillary     Status: Abnormal   Collection Time: 02/19/16  4:43 PM  Result Value Ref Range   Glucose-Capillary 146 (H) 65 - 99 mg/dL  Glucose, capillary     Status: Abnormal   Collection Time: 02/19/16  7:59 PM  Result Value Ref Range   Glucose-Capillary 222 (H) 65 - 99 mg/dL  Glucose, capillary     Status: Abnormal   Collection Time: 02/20/16 12:03 AM  Result Value Ref Range   Glucose-Capillary 157 (H) 65 - 99 mg/dL   Comment 1 Notify RN    Comment 2 Document in Chart   Comprehensive metabolic panel     Status: Abnormal   Collection Time: 02/20/16  3:20 AM  Result Value Ref Range   Sodium 137 135 - 145 mmol/L   Potassium 3.8 3.5 - 5.1 mmol/L   Chloride 102 101 - 111 mmol/L   CO2 25 22 - 32 mmol/L   Glucose, Bld 125 (H) 65 - 99 mg/dL   BUN 6 6 - 20 mg/dL   Creatinine, Ser  0.94 0.61 - 1.24 mg/dL   Calcium 8.2 (L) 8.9 - 10.3 mg/dL   Total Protein 5.0 (L) 6.5 - 8.1 g/dL   Albumin 2.6 (L) 3.5 - 5.0 g/dL   AST 101 (H) 15 - 41 U/L   ALT 121 (H) 17 - 63 U/L   Alkaline  Phosphatase 207 (H) 38 - 126 U/L   Total Bilirubin 1.9 (H) 0.3 - 1.2 mg/dL   GFR calc non Af Amer >60 >60 mL/min   GFR calc Af Amer >60 >60 mL/min    Comment: (NOTE) The eGFR has been calculated using the CKD EPI equation. This calculation has not been validated in all clinical situations. eGFR's persistently <60 mL/min signify possible Chronic Kidney Disease.    Anion gap 10 5 - 15  CBC     Status: Abnormal   Collection Time: 02/20/16  3:20 AM  Result Value Ref Range   WBC 10.5 4.0 - 10.5 K/uL   RBC 4.31 4.22 - 5.81 MIL/uL   Hemoglobin 12.1 (L) 13.0 - 17.0 g/dL   HCT 37.2 (L) 39.0 - 52.0 %   MCV 86.3 78.0 - 100.0 fL   MCH 28.1 26.0 - 34.0 pg   MCHC 32.5 30.0 - 36.0 g/dL   RDW 19.3 (H) 11.5 - 15.5 %   Platelets 188 150 - 400 K/uL  Glucose, capillary     Status: Abnormal   Collection Time: 02/20/16  3:38 AM  Result Value Ref Range   Glucose-Capillary 119 (H) 65 - 99 mg/dL   Comment 1 Notify RN    Comment 2 Document in Chart   Glucose, capillary     Status: Abnormal   Collection Time: 02/20/16  8:15 AM  Result Value Ref Range   Glucose-Capillary 122 (H) 65 - 99 mg/dL    Imaging / Studies: No results found.  Medications / Allergies:  Scheduled Meds: . ampicillin-sulbactam (UNASYN) IV  1.5 g Intravenous Q6H  . baclofen  20 mg Oral BID  . carvedilol  12.5 mg Oral BID WC  . ciprofloxacin-dexamethasone  3 drop Left Ear BID  . heparin  5,000 Units Subcutaneous Q8H  . loratadine  10 mg Oral Daily  . metoCLOPramide (REGLAN) injection  10 mg Intravenous Q6H  . pneumococcal 23 valent vaccine  0.5 mL Intramuscular Tomorrow-1000   Continuous Infusions: . lactated ringers 10 mL/hr (02/19/16 1747)   PRN Meds:.acetaminophen **OR** [DISCONTINUED] acetaminophen, hydrALAZINE, HYDROmorphone (DILAUDID) injection, meclizine, metoprolol, morphine injection, nitroGLYCERIN, oxyCODONE-acetaminophen **AND** oxyCODONE  Antibiotics: Anti-infectives    Start     Dose/Rate Route Frequency  Ordered Stop   02/15/16 0830  ampicillin-sulbactam (UNASYN) 1.5 g in sodium chloride 0.9 % 50 mL IVPB     1.5 g 100 mL/hr over 30 Minutes Intravenous Every 6 hours 02/15/16 0822     02/14/16 0930  piperacillin-tazobactam (ZOSYN) IVPB 3.375 g  Status:  Discontinued     3.375 g 12.5 mL/hr over 240 Minutes Intravenous Every 8 hours 02/14/16 0922 02/15/16 0822   02/13/16 1430  piperacillin-tazobactam (ZOSYN) IVPB 3.375 g     3.375 g 100 mL/hr over 30 Minutes Intravenous  Once 02/13/16 1417 02/13/16 1457        Assessment/Plan Cholangitis Choledocholithiasis  POD#1 laparoscopic cholecystectomy---Dr. Ninfa Linden -advance diet, needs to mobilize, PT eval, encourage PO pain meds  -may stop antibiotics from a surgical standpoint -unable to perform a cholangiogram, monitor LFTs VTE prophylaxis-SCD/heparin Dispo-per primary team    Erby Pian, [facility] Pager [phone](7A-4:30P) For consults and floor  pages call [phone](7A-4:30P)  02/20/2016 10:32 AM

## 2016-02-20 NOTE — Progress Notes (Signed)
No apparent cardiac complications from surgery - net negative fluids. Please contact cardiology if additional assistance is needed.  Chrystie Nose, MD, [facility] Attending Cardiologist [facility]

## 2016-02-21 ENCOUNTER — Encounter (HOSPITAL_COMMUNITY): Payer: Self-pay | Admitting: Gastroenterology

## 2016-02-21 DIAGNOSIS — D509 Iron deficiency anemia, unspecified: Secondary | ICD-10-CM

## 2016-02-21 LAB — MAGNESIUM: MAGNESIUM: 1.9 mg/dL (ref 1.7–2.4)

## 2016-02-21 LAB — CBC WITH DIFFERENTIAL/PLATELET
BASOS ABS: 0 10*3/uL (ref 0.0–0.1)
BASOS PCT: 0 %
EOS ABS: 0.6 10*3/uL (ref 0.0–0.7)
Eosinophils Relative: 6 %
HEMATOCRIT: 36.4 % — AB (ref 39.0–52.0)
HEMOGLOBIN: 11.8 g/dL — AB (ref 13.0–17.0)
LYMPHS PCT: 11 %
Lymphs Abs: 1.1 10*3/uL (ref 0.7–4.0)
MCH: 27.8 pg (ref 26.0–34.0)
MCHC: 32.4 g/dL (ref 30.0–36.0)
MCV: 85.8 fL (ref 78.0–100.0)
MONOS PCT: 12 %
Monocytes Absolute: 1.2 10*3/uL — ABNORMAL HIGH (ref 0.1–1.0)
NEUTROS ABS: 6.7 10*3/uL (ref 1.7–7.7)
NEUTROS PCT: 71 %
Platelets: 181 10*3/uL (ref 150–400)
RBC: 4.24 MIL/uL (ref 4.22–5.81)
RDW: 19.3 % — ABNORMAL HIGH (ref 11.5–15.5)
WBC: 9.6 10*3/uL (ref 4.0–10.5)

## 2016-02-21 LAB — COMPREHENSIVE METABOLIC PANEL
ALBUMIN: 2.5 g/dL — AB (ref 3.5–5.0)
ALK PHOS: 179 U/L — AB (ref 38–126)
ALT: 76 U/L — ABNORMAL HIGH (ref 17–63)
AST: 42 U/L — AB (ref 15–41)
Anion gap: 11 (ref 5–15)
BILIRUBIN TOTAL: 2.3 mg/dL — AB (ref 0.3–1.2)
BUN: 8 mg/dL (ref 6–20)
CALCIUM: 8.2 mg/dL — AB (ref 8.9–10.3)
CO2: 23 mmol/L (ref 22–32)
Chloride: 102 mmol/L (ref 101–111)
Creatinine, Ser: 0.91 mg/dL (ref 0.61–1.24)
GFR calc Af Amer: >60 mL/min (ref >60)
GLUCOSE: 110 mg/dL — AB (ref 65–99)
Potassium: 4.7 mmol/L (ref 3.5–5.1)
Sodium: 136 mmol/L (ref 135–145)
TOTAL PROTEIN: 5.2 g/dL — AB (ref 6.5–8.1)

## 2016-02-21 LAB — GLUCOSE, CAPILLARY
GLUCOSE-CAPILLARY: 118 mg/dL — AB (ref 65–99)
GLUCOSE-CAPILLARY: 168 mg/dL — AB (ref 65–99)
GLUCOSE-CAPILLARY: 159 mg/dL — AB (ref 65–99)
Glucose-Capillary: 154 mg/dL — ABNORMAL HIGH (ref 65–99)

## 2016-02-21 MED ORDER — ATORVASTATIN CALCIUM 40 MG PO TABS
40.0000 mg | ORAL_TABLET | ORAL | Status: DC
  Administered 2016-02-21: 40 mg via ORAL
  Filled 2016-02-21: qty 1

## 2016-02-21 MED ORDER — SENNOSIDES-DOCUSATE SODIUM 8.6-50 MG PO TABS
1.0000 | ORAL_TABLET | Freq: Every day | ORAL | Status: DC
  Administered 2016-02-21: 1 via ORAL
  Filled 2016-02-21: qty 1

## 2016-02-21 MED ORDER — CLOPIDOGREL BISULFATE 75 MG PO TABS
75.0000 mg | ORAL_TABLET | Freq: Every day | ORAL | Status: DC
  Administered 2016-02-21 – 2016-02-22 (×2): 75 mg via ORAL
  Filled 2016-02-21 (×2): qty 1

## 2016-02-21 MED ORDER — ASPIRIN EC 81 MG PO TBEC
81.0000 mg | DELAYED_RELEASE_TABLET | Freq: Every day | ORAL | Status: DC
  Administered 2016-02-21 – 2016-02-22 (×2): 81 mg via ORAL
  Filled 2016-02-21 (×2): qty 1

## 2016-02-21 MED ORDER — POLYETHYLENE GLYCOL 3350 17 G PO PACK
17.0000 g | PACK | Freq: Every day | ORAL | Status: DC
Start: 2016-02-21 — End: 2016-02-22
  Administered 2016-02-21 – 2016-02-22 (×2): 17 g via ORAL
  Filled 2016-02-21 (×2): qty 1

## 2016-02-21 NOTE — Evaluation (Signed)
Physical Therapy Evaluation Patient Details Name: [name] MRN: [id number] DOB: [birth year] Today's Date: 02/21/2016   History of Present Illness  Pt s/p laparoscopic cholecystectomy (5/23) with PMH of B TKA, CAD, CABG, CHF, DM2, CVA affecting R side   Clinical Impression  Pt admitted with above diagnosis. Pt currently with functional limitations due to the deficits listed below (see PT Problem List).  Pt will benefit from skilled PT to increase their independence and safety with mobility to allow discharge to the venue listed below.  Pt lives with wife who is in w/c.  PT recommended that other family come and stay with pt and wife until he gets stronger.  He states his daughter lives nearby and will be able to provide this level of A.  Pt with hx of CVA affecting R side and does present with decreased R LE ROM with gait.  Recommend HHPT.  He has all the DME he needs at this time.    Follow Up Recommendations Home health PT;Supervision for mobility/OOB;Supervision/Assistance - 24 hour    Equipment Recommendations  None recommended by PT    Recommendations for Other Services       Precautions / Restrictions Precautions Precautions: Fall Restrictions Weight Bearing Restrictions: No      Mobility  Bed Mobility Overal bed mobility: Needs Assistance Bed Mobility: Rolling;Sidelying to Sit Rolling: Min guard Sidelying to sit: Min assist       General bed mobility comments: attempted supine >sit, but pt c/o pain, so had pt roll and then go sidelying > sit, pt needed to pull on PT's hand to get trunk upright  Transfers Overall transfer level: Needs assistance Equipment used: Rolling walker (2 wheeled) Transfers: Sit to/from Stand Sit to Stand: Min guard         General transfer comment: Decreased coordination with R hand placement due to old CVA.  Ambulation/Gait Ambulation/Gait assistance: Min guard Ambulation Distance (Feet): 90 Feet Assistive device: Rolling  walker (2 wheeled) Gait Pattern/deviations: Decreased dorsiflexion - right;Decreased step length - right Gait velocity: decreased   General Gait Details: Pt ambulated with decreased R knee flex during swing phase and decreased ankle df.  Pt reports this is close to bsaeline, but can normally ambulate without RW.  Needing to re-adjust R hand on RW several times due to decreased grip.  Stairs            Wheelchair Mobility    Modified Rankin (Stroke Patients Only)       Balance Overall balance assessment: Needs assistance   Sitting balance-Leahy Scale: Good       Standing balance-Leahy Scale: Poor Standing balance comment: requiring UE support                             Pertinent Vitals/Pain Pain Assessment: 0-10 Pain Score: 5  Pain Location: R side of body from old stroke Pain Descriptors / Indicators: Numbness;Other (Comment) ("just hurts")    Home Living Family/patient expects to be discharged to:: Private residence Living Arrangements: Spouse/significant other (wife in w/c from CVA) Available Help at Discharge: Family;Other (Comment);Available 24 hours/day (daughter and sister nearby, he thinks they can provide 24 hour A initially) Type of Home: House Home Access: Ramped entrance     Home Layout: One level Home Equipment: Cane - single point;Walker - 2 wheels;Bedside commode;Shower seat      Prior Function Level of Independence: Independent  Hand Dominance   Dominant Hand: Left    Extremity/Trunk Assessment   Upper Extremity Assessment: Overall WFL for tasks assessed           Lower Extremity Assessment: Overall WFL for tasks assessed;RLE deficits/detail RLE Deficits / Details: R LE slightly weaker than L.  Decreased df and knee flex during gait    Cervical / Trunk Assessment: Normal  Communication   Communication: No difficulties  Cognition Arousal/Alertness: Awake/alert Behavior During Therapy: WFL for tasks  assessed/performed Overall Cognitive Status: Within Functional Limits for tasks assessed                      General Comments      Exercises        Assessment/Plan    PT Assessment Patient needs continued PT services  PT Diagnosis Difficulty walking   PT Problem List Decreased strength;Decreased activity tolerance;Decreased balance;Decreased mobility  PT Treatment Interventions Gait training;Functional mobility training;Therapeutic activities;Therapeutic exercise;DME instruction;Balance training   PT Goals (Current goals can be found in the Care Plan section) Acute Rehab PT Goals Patient Stated Goal: to walk better PT Goal Formulation: With patient Time For Goal Achievement: 02/28/16 Potential to Achieve Goals: Good    Frequency Min 3X/week   Barriers to discharge        Co-evaluation               End of Session Equipment Utilized During Treatment: Gait belt Activity Tolerance: Patient tolerated treatment well Patient left: in chair;with call bell/phone within reach;with chair alarm set Nurse Communication: Mobility status         Time: 1610-9604 PT Time Calculation (min) (ACUTE ONLY): 27 min   Charges:   PT Evaluation $PT Eval Moderate Complexity: 1 Procedure PT Treatments $Gait Training: 8-22 mins   PT G Codes:        [name] 02/21/2016, 9:37 AM

## 2016-02-21 NOTE — Progress Notes (Signed)
Patient ID: [name], male   DOB: 1946-06-06, 70 y.o.   MRN: [id number]     [address] [place], [place] [address]    Phone: [phone] FAX: [phone]     Subjective: Ast/alt and alk phos down, t bili 1.9-->2.3 today.  Clinically looks stable.   Objective:  Vital signs:  Filed Vitals:   02/20/16 1200 02/20/16 2031 02/21/16 0648 02/21/16 0800  BP: 133/57 156/58 129/73   Pulse: 64 68 79   Temp: 98.4 F (36.9 C) 98.2 F (36.8 C) 98.7 F (37.1 C)   TempSrc: Oral Oral Oral   Resp: _0 Height:      Weight:   91.082 kg (200 lb 12.8 oz)   SpO2: 100% 98% 98% 96%    Last BM Date: 02/18/16  Intake/Output   Yesterday:  05/24 0701 - 05/25 0700 In: 1018.3 [P.O.:780; I.V.:238.3] Out: 1075 [Urine:1075] This shift:  Total I/O In: 460 [P.O.:460] Out: 200 [Urine:200]  Physical Exam: General: Pt awake/alert/oriented x4 in no acute distress  Abdomen: Soft. Nondistended. Mildly tender at incisions only. Incisions are c/d/i. No evidence of peritonitis. No incarcerated hernias.    Problem List:   Principal Problem:   Biliary colic Active Problems:   DM (diabetes mellitus), secondary, with peripheral vascular complications (HCC)   Iron deficiency anemia   Essential hypertension   CORONARY ARTERY BYPASS GRAFT, HX OF   Elevated LFTs   Hypokalemia   Chronic combined systolic and diastolic CHF (congestive heart failure) (HCC)   Prolonged Q-T interval on ECG   Left ear pain   Dry eye    Results:   Labs: Results for orders placed or performed during the hospital encounter of 02/13/16 (from the past 48 hour(s))  Glucose, capillary     Status: Abnormal   Collection Time: 02/19/16 12:16 PM  Result Value Ref Range   Glucose-Capillary 132 (H) 65 - 99 mg/dL  Glucose, capillary     Status: Abnormal   Collection Time: 02/19/16  4:43 PM  Result Value Ref Range   Glucose-Capillary 146 (H)  65 - 99 mg/dL  Glucose, capillary     Status: Abnormal   Collection Time: 02/19/16  7:59 PM  Result Value Ref Range   Glucose-Capillary 222 (H) 65 - 99 mg/dL  Glucose, capillary     Status: Abnormal   Collection Time: 02/20/16 12:03 AM  Result Value Ref Range   Glucose-Capillary 157 (H) 65 - 99 mg/dL   Comment 1 Notify RN    Comment 2 Document in Chart   Comprehensive metabolic panel     Status: Abnormal   Collection Time: 02/20/16  3:20 AM  Result Value Ref Range   Sodium 137 135 - 145 mmol/L   Potassium 3.8 3.5 - 5.1 mmol/L   Chloride 102 101 - 111 mmol/L   CO2 25 22 - 32 mmol/L   Glucose, Bld 125 (H) 65 - 99 mg/dL   BUN 6 6 - 20 mg/dL   Creatinine, Ser 0.94 0.61 - 1.24 mg/dL   Calcium 8.2 (L) 8.9 - 10.3 mg/dL   Total Protein 5.0 (L) 6.5 - 8.1 g/dL   Albumin 2.6 (L) 3.5 - 5.0 g/dL   AST 101 (H) 15 - 41 U/L   ALT 121 (H) 17 - 63 U/L   Alkaline Phosphatase 207 (H) 38 - 126 U/L   Total Bilirubin 1.9 (H) 0.3 - 1.2 mg/dL  GFR calc non Af Amer >60 >60 mL/min   GFR calc Af Amer >60 >60 mL/min    Comment: (NOTE) The eGFR has been calculated using the CKD EPI equation. This calculation has not been validated in all clinical situations. eGFR's persistently <60 mL/min signify possible Chronic Kidney Disease.    Anion gap 10 5 - 15  CBC     Status: Abnormal   Collection Time: 02/20/16  3:20 AM  Result Value Ref Range   WBC 10.5 4.0 - 10.5 K/uL   RBC 4.31 4.22 - 5.81 MIL/uL   Hemoglobin 12.1 (L) 13.0 - 17.0 g/dL   HCT 37.2 (L) 39.0 - 52.0 %   MCV 86.3 78.0 - 100.0 fL   MCH 28.1 26.0 - 34.0 pg   MCHC 32.5 30.0 - 36.0 g/dL   RDW 19.3 (H) 11.5 - 15.5 %   Platelets 188 150 - 400 K/uL  Glucose, capillary     Status: Abnormal   Collection Time: 02/20/16  3:38 AM  Result Value Ref Range   Glucose-Capillary 119 (H) 65 - 99 mg/dL   Comment 1 Notify RN    Comment 2 Document in Chart   Glucose, capillary     Status: Abnormal   Collection Time: 02/20/16  8:15 AM  Result Value Ref  Range   Glucose-Capillary 122 (H) 65 - 99 mg/dL  Glucose, capillary     Status: Abnormal   Collection Time: 02/20/16 11:59 AM  Result Value Ref Range   Glucose-Capillary 171 (H) 65 - 99 mg/dL   Comment 1 Notify RN    Comment 2 Document in Chart   Glucose, capillary     Status: Abnormal   Collection Time: 02/20/16  4:41 PM  Result Value Ref Range   Glucose-Capillary 149 (H) 65 - 99 mg/dL   Comment 1 Notify RN    Comment 2 Document in Chart   Glucose, capillary     Status: Abnormal   Collection Time: 02/20/16  8:29 PM  Result Value Ref Range   Glucose-Capillary 111 (H) 65 - 99 mg/dL  CBC with Differential/Platelet     Status: Abnormal   Collection Time: 02/21/16  5:13 AM  Result Value Ref Range   WBC 9.6 4.0 - 10.5 K/uL    Comment: WHITE COUNT CONFIRMED ON SMEAR REPEATED TO VERIFY    RBC 4.24 4.22 - 5.81 MIL/uL   Hemoglobin 11.8 (L) 13.0 - 17.0 g/dL   HCT 36.4 (L) 39.0 - 52.0 %   MCV 85.8 78.0 - 100.0 fL   MCH 27.8 26.0 - 34.0 pg   MCHC 32.4 30.0 - 36.0 g/dL   RDW 19.3 (H) 11.5 - 15.5 %   Platelets 181 150 - 400 K/uL    Comment: PLATELET COUNT CONFIRMED BY SMEAR REPEATED TO VERIFY    Neutrophils Relative % 71 %   Lymphocytes Relative 11 %   Monocytes Relative 12 %   Eosinophils Relative 6 %   Basophils Relative 0 %   Neutro Abs 6.7 1.7 - 7.7 K/uL   Lymphs Abs 1.1 0.7 - 4.0 K/uL   Monocytes Absolute 1.2 (H) 0.1 - 1.0 K/uL   Eosinophils Absolute 0.6 0.0 - 0.7 K/uL   Basophils Absolute 0.0 0.0 - 0.1 K/uL  Comprehensive metabolic panel     Status: Abnormal   Collection Time: 02/21/16  5:13 AM  Result Value Ref Range   Sodium 136 135 - 145 mmol/L   Potassium 4.7 3.5 - 5.1 mmol/L    Comment:  SLIGHT HEMOLYSIS   Chloride 102 101 - 111 mmol/L   CO2 23 22 - 32 mmol/L   Glucose, Bld 110 (H) 65 - 99 mg/dL   BUN 8 6 - 20 mg/dL   Creatinine, Ser 0.91 0.61 - 1.24 mg/dL   Calcium 8.2 (L) 8.9 - 10.3 mg/dL   Total Protein 5.2 (L) 6.5 - 8.1 g/dL   Albumin 2.5 (L) 3.5 - 5.0 g/dL    AST 42 (H) 15 - 41 U/L   ALT 76 (H) 17 - 63 U/L   Alkaline Phosphatase 179 (H) 38 - 126 U/L   Total Bilirubin 2.3 (H) 0.3 - 1.2 mg/dL   GFR calc non Af Amer >60 >60 mL/min   GFR calc Af Amer >60 >60 mL/min    Comment: (NOTE) The eGFR has been calculated using the CKD EPI equation. This calculation has not been validated in all clinical situations. eGFR's persistently <60 mL/min signify possible Chronic Kidney Disease.    Anion gap 11 5 - 15  Magnesium     Status: None   Collection Time: 02/21/16  5:13 AM  Result Value Ref Range   Magnesium 1.9 1.7 - 2.4 mg/dL  Glucose, capillary     Status: Abnormal   Collection Time: 02/21/16  7:41 AM  Result Value Ref Range   Glucose-Capillary 118 (H) 65 - 99 mg/dL    Imaging / Studies: No results found.  Medications / Allergies:  Scheduled Meds: . aspirin EC  81 mg Oral Daily  . atorvastatin  40 mg Oral QODAY  . baclofen  20 mg Oral BID  . carvedilol  12.5 mg Oral BID WC  . clopidogrel  75 mg Oral Daily  . heparin  5,000 Units Subcutaneous Q8H  . insulin aspart  0-5 Units Subcutaneous QHS  . insulin aspart  0-9 Units Subcutaneous TID WC  . loratadine  10 mg Oral Daily  . metoCLOPramide (REGLAN) injection  10 mg Intravenous Q6H  . pneumococcal 23 valent vaccine  0.5 mL Intramuscular Tomorrow-1000  . polyethylene glycol  17 g Oral Daily   Continuous Infusions:  PRN Meds:.acetaminophen **OR** [DISCONTINUED] acetaminophen, hydrALAZINE, hydroxypropyl methylcellulose / hypromellose, meclizine, metoprolol, nitroGLYCERIN, oxyCODONE-acetaminophen **AND** oxyCODONE  Antibiotics: Anti-infectives    Start     Dose/Rate Route Frequency Ordered Stop   02/15/16 0830  ampicillin-sulbactam (UNASYN) 1.5 g in sodium chloride 0.9 % 50 mL IVPB  Status:  Discontinued     1.5 g 100 mL/hr over 30 Minutes Intravenous Every 6 hours 02/15/16 0822 02/20/16 1144   02/14/16 0930  piperacillin-tazobactam (ZOSYN) IVPB 3.375 g  Status:  Discontinued     3.375  g 12.5 mL/hr over 240 Minutes Intravenous Every 8 hours 02/14/16 0922 02/15/16 0822   02/13/16 1430  piperacillin-tazobactam (ZOSYN) IVPB 3.375 g     3.375 g 100 mL/hr over 30 Minutes Intravenous  Once 02/13/16 1417 02/13/16 1457        Assessment/Plan Cholangitis Choledocholithiasis  POD#2 laparoscopic cholecystectomy---Dr. Ninfa Linden -tolerating POs, PO pain meds, passing flatus, up with therapies.  VTE prophylaxis-SCD/heparin Dispo-per primary team    Erby Pian, [facility] Surgery Pager [phone]) For consults and floor pages call [phone](7A-4:30P)  02/21/2016 9:16 AM

## 2016-02-21 NOTE — Progress Notes (Signed)
[facility] Progress Note  Patient: [name]   PCP: [name], DO DOB: 08/17/1946   DOA: 02/13/2016   DOS: 02/21/2016   Date of Service: the patient was seen and examined on 02/21/2016  Subjective: Patient denies any acute complaint. Other than abdominal pain at the surgical site. More on the right side. No nausea no vomiting. Complaint of constipation and has been passing gas Nutrition: Tolerating oral diet  Brief hospital course: Patient  with past medical history of coronary artery disease status post CABG, chronic combined CHF with AICD, iron deficiency anemia, type 2 diabetes mellitus and A. fib not on any anticoagulation due to bleeding, was admitted on 02/13/2016, with complaint of  generalized weakness abdominal pain and nausea was found to have cholelithiasis without any evidence of cholecystitis. He underwent ERCP on May 22 with CBD stone removal. Underwent laparoscopic cholecystectomy on May 23. Cardiology was consulted for preoperative clearance. Currently further plan is continue postoperative recovery.  Assessment and Plan: 1. Biliary colic, Ascending cholangitis  Patient was initially started on empiric Unasyn, GI and surgery was consulted. status post ERCP and CBD stone removal May 22. Status post laparoscopic cholecystectomy May 23. Postoperatively patient does not have any acute complaint and therefore antibiotic will be discontinued, per surgery. Diet is advanced and the patient is able to tolerate it without any nausea or vomiting. Pain management as needed. When necessary Zofran. Physical therapy consult pending.  Pain management has been issue, patient requiring 4 rounds of Dilaudid yesterday. Has not been using OxyIR morphine frequently and therefore we'll discontinue Dilaudid and recommended patient to use oral narcotics for pain management  2.history of coronary artery disease with CABG as well as chronic combined systolic and  diastolic CHF, ejection fraction 30%. Has AICD. Resume aspirin and Plavix. Continue carvedilol No evidence of volume overload, in the hospital postoperatively, continue to hold Lasix and Aldactone for 02/21/2016.  3. Type 2 diabetes mellitus. Holding oral hypoglycemic agent. Continue on sliding scale insulin. Hemoglobin A1c on admission 7.3.  4. Dyslipidemia. Resume statin today.  5.Atrial fibrillation CHA2DS2-VASc Score 4 Patient is currently rate controlled. We'll continue aspirin and Coreg Not on any anticoagulation due to prior history of GI bleed.  Pain management: Continue when necessary Percocet, and discontinue when necessary Dilaudid Continue scheduled baclofen Activity: Consulted physical therapy Bowel regimen: last BM 02/18/2016 MiraLAX added Diet:  carb modified diet heart healthy DVT Prophylaxis: subcutaneous Heparin  Advance goals of care discussion: full code  Family Communication: no family was present at bedside, at the time of interview.   Disposition:  Discharge to home, likely will need home health  Expected discharge date: 5/26, pending improvement in pain as well as constipation  Consultants: Gastroenterology, general surgery  Procedures: ERCP May 22   laparoscopic cholecystectomy May 23  Antibiotics: Anti-infectives    Start     Dose/Rate Route Frequency Ordered Stop   02/15/16 0830  ampicillin-sulbactam (UNASYN) 1.5 g in sodium chloride 0.9 % 50 mL IVPB  Status:  Discontinued     1.5 g 100 mL/hr over 30 Minutes Intravenous Every 6 hours 02/15/16 0822 02/20/16 1144   02/14/16 0930  piperacillin-tazobactam (ZOSYN) IVPB 3.375 g  Status:  Discontinued     3.375 g 12.5 mL/hr over 240 Minutes Intravenous Every 8 hours 02/14/16 0922 02/15/16 0822   02/13/16 1430  piperacillin-tazobactam (ZOSYN) IVPB 3.375 g     3.375 g 100 mL/hr over 30 Minutes Intravenous  Once 02/13/16 1417 02/13/16 1457  Intake/Output Summary (Last 24 hours) at 02/21/16  0741 Last data filed at 02/21/16 0220  Gross per 24 hour  Intake 1018.33 ml  Output   1075 ml  Net -56.67 ml   Filed Weights   02/19/16 0457 02/20/16 0500 02/21/16 0648  Weight: 95.528 kg (210 lb 9.6 oz) 92.851 kg (204 lb 11.2 oz) 91.082 kg (200 lb 12.8 oz)    Objective: Physical Exam: Filed Vitals:   02/20/16 0900 02/20/16 1200 02/20/16 2031 02/21/16 0648  BP: 114/52 133/57 156/58 129/73  Pulse: 63 64 68 79  Temp: 98.5 F (36.9 C) 98.4 F (36.9 C) 98.2 F (36.8 C) 98.7 F (37.1 C)  TempSrc: Oral Oral Oral Oral  Resp: 18 18 18 18   Height:      Weight:    91.082 kg (200 lb 12.8 oz)  SpO2: 96% 100% 98% 98%   General: Alert, Awake and Oriented to Time, Place and Person. Appear in mild distress Eyes: PERRL, Conjunctiva normal ENT: Oral Mucosa clear moist. Cardiovascular: S1 and S2 Present, no Murmur,  Respiratory: Bilateral Air entry equal and Decreased, Clear to Auscultation, no Crackles, no wheezes Abdomen: Bowel Sound present, Soft and mild diffuse tenderness Skin: no redness, no Rash  Extremities: no Pedal edema, no calf tenderness Neurologic: Grossly no focal neuro deficit. Bilaterally Equal motor strength  Data Reviewed: CBC:  Recent Labs Lab 02/17/16 0425 02/18/16 0216 02/19/16 0318 02/20/16 0320 02/21/16 0513  WBC 3.9* 5.3 5.9 10.5 PENDING  NEUTROABS  --   --  3.7  --  PENDING  HGB 12.3* 12.1* 11.8* 12.1* 11.8*  HCT 38.1* 37.5* 37.2* 37.2* 36.4*  MCV 85.4 84.8 85.5 86.3 85.8  PLT 165 170 195 188 PENDING   Basic Metabolic Panel:  Recent Labs Lab 02/16/16 0809 02/17/16 0425 02/18/16 0216 02/19/16 0318 02/20/16 0320 02/21/16 0513  NA 139 140 142 139 137 136  K 4.0 3.5 3.6 3.5 3.8 4.7  CL 106 105 110 106 102 102  CO2 24 24 24 27 25 23   GLUCOSE 177* 131* 114* 94 125* 110*  BUN 7 6 <5* <5* 6 8  CREATININE 0.89 0.90 0.75 0.86 0.94 0.91  CALCIUM 8.2* 8.2* 8.2* 8.3* 8.2* 8.2*  MG 2.3  --   --   --   --  1.9    Liver Function Tests:  Recent  Labs Lab 02/17/16 0425 02/18/16 0216 02/19/16 0318 02/20/16 0320 02/21/16 0513  AST 59* 86* 109* 101* 42*  ALT 99* 106* 124* 121* 76*  ALKPHOS 276* 247* 238* 207* 179*  BILITOT 4.3* 2.4* 1.8* 1.9* 2.3*  PROT 5.3* 5.2* 5.1* 5.0* 5.2*  ALBUMIN 2.4* 2.6* 2.6* 2.6* 2.5*    Recent Labs Lab 02/15/16 0516  LIPASE <10*   No results for input(s): AMMONIA in the last 168 hours. Coagulation Profile:  Recent Labs Lab 02/14/16 1203 02/18/16 1228  INR 1.17 1.08   Cardiac Enzymes: No results for input(s): CKTOTAL, CKMB, CKMBINDEX, TROPONINI in the last 168 hours. BNP (last 3 results) No results for input(s): PROBNP in the last 8760 hours.  CBG:  Recent Labs Lab 02/20/16 0338 02/20/16 0815 02/20/16 1159 02/20/16 1641 02/20/16 2029  GLUCAP 119* 122* 171* 149* 111*    Studies: No results found.   Scheduled Meds: . aspirin EC  81 mg Oral Daily  . atorvastatin  40 mg Oral QODAY  . baclofen  20 mg Oral BID  . carvedilol  12.5 mg Oral BID WC  . clopidogrel  75 mg Oral  Daily  . heparin  5,000 Units Subcutaneous Q8H  . insulin aspart  0-5 Units Subcutaneous QHS  . insulin aspart  0-9 Units Subcutaneous TID WC  . loratadine  10 mg Oral Daily  . metoCLOPramide (REGLAN) injection  10 mg Intravenous Q6H  . pneumococcal 23 valent vaccine  0.5 mL Intramuscular Tomorrow-1000  . polyethylene glycol  17 g Oral Daily   Continuous Infusions:   PRN Meds: acetaminophen **OR** [DISCONTINUED] acetaminophen, hydrALAZINE, hydroxypropyl methylcellulose / hypromellose, meclizine, metoprolol, nitroGLYCERIN, oxyCODONE-acetaminophen **AND** oxyCODONE  Time spent: 30 minutes  Author: Lynden Oxford, MD [facility] Pager: [phone] 02/21/2016 7:41 AM  If 7PM-7AM, please contact night-coverage at [email or web address], password [facility]

## 2016-02-22 DIAGNOSIS — K8031 Calculus of bile duct with cholangitis, unspecified, with obstruction: Secondary | ICD-10-CM | POA: Diagnosis not present

## 2016-02-22 LAB — CBC WITH DIFFERENTIAL/PLATELET
BASOS ABS: 0 10*3/uL (ref 0.0–0.1)
BASOS PCT: 0 %
EOS ABS: 0.4 10*3/uL (ref 0.0–0.7)
EOS PCT: 6 %
HCT: 37.8 % — ABNORMAL LOW (ref 39.0–52.0)
Hemoglobin: 11.8 g/dL — ABNORMAL LOW (ref 13.0–17.0)
Lymphocytes Relative: 15 %
Lymphs Abs: 1.1 10*3/uL (ref 0.7–4.0)
MCH: 27.2 pg (ref 26.0–34.0)
MCHC: 31.2 g/dL (ref 30.0–36.0)
MCV: 87.1 fL (ref 78.0–100.0)
MONO ABS: 1 10*3/uL (ref 0.1–1.0)
Monocytes Relative: 13 %
Neutro Abs: 4.9 10*3/uL (ref 1.7–7.7)
Neutrophils Relative %: 66 %
PLATELETS: 211 10*3/uL (ref 150–400)
RBC: 4.34 MIL/uL (ref 4.22–5.81)
RDW: 19.1 % — AB (ref 11.5–15.5)
WBC: 7.5 10*3/uL (ref 4.0–10.5)

## 2016-02-22 LAB — COMPREHENSIVE METABOLIC PANEL
ALT: 56 U/L (ref 17–63)
AST: 22 U/L (ref 15–41)
Albumin: 2.7 g/dL — ABNORMAL LOW (ref 3.5–5.0)
Alkaline Phosphatase: 164 U/L — ABNORMAL HIGH (ref 38–126)
Anion gap: 7 (ref 5–15)
BUN: 9 mg/dL (ref 6–20)
CHLORIDE: 103 mmol/L (ref 101–111)
CO2: 29 mmol/L (ref 22–32)
CREATININE: 0.88 mg/dL (ref 0.61–1.24)
Calcium: 8.3 mg/dL — ABNORMAL LOW (ref 8.9–10.3)
GFR calc Af Amer: >60 mL/min (ref >60)
Glucose, Bld: 90 mg/dL (ref 65–99)
POTASSIUM: 3.6 mmol/L (ref 3.5–5.1)
SODIUM: 139 mmol/L (ref 135–145)
Total Bilirubin: 1.5 mg/dL — ABNORMAL HIGH (ref 0.3–1.2)
Total Protein: 5.7 g/dL — ABNORMAL LOW (ref 6.5–8.1)

## 2016-02-22 LAB — GLUCOSE, CAPILLARY
GLUCOSE-CAPILLARY: 88 mg/dL (ref 65–99)
Glucose-Capillary: 120 mg/dL — ABNORMAL HIGH (ref 65–99)

## 2016-02-22 LAB — MAGNESIUM: MAGNESIUM: 2.1 mg/dL (ref 1.7–2.4)

## 2016-02-22 MED ORDER — POLYETHYLENE GLYCOL 3350 17 G PO PACK: 17.0000 g | PACK | Freq: Every day | ORAL | Status: DC

## 2016-02-22 MED ORDER — METOCLOPRAMIDE HCL 10 MG PO TABS: 10.0000 mg | ORAL_TABLET | Freq: Three times a day (TID) | ORAL | Status: DC

## 2016-02-22 NOTE — Progress Notes (Signed)
Pt weaned to 1L [place]. Will continue to monitor.

## 2016-02-22 NOTE — Progress Notes (Signed)
Patient ID: [name], male   DOB: Jul 23, 1946, 70 y.o.   MRN: [id number]     [address], [place] [address]    Phone: [phone] FAX: [phone]     Subjective: Passing flatus. No bm.  No n/v.  Tolerating POs. LFTs are trending down.  TB down to 1.5 from 2.3.   Objective:  Vital signs:  Filed Vitals:   02/21/16 2100 02/22/16 0551 02/22/16 0640 02/22/16 0835  BP: 117/54 127/60  142/65  Pulse: 60 63  64  Temp: 98.8 F (37.1 C) 99.1 F (37.3 C)    TempSrc: Oral Oral    Resp: 18 19    Height:      Weight:  89.404 kg (197 lb 1.6 oz) 90.357 kg (199 lb 3.2 oz)   SpO2: 98% 98%  94%    Last BM Date: 02/18/16  Intake/Output   Yesterday:  05/25 0701 - 05/26 0700 In: 1140 [P.O.:1140] Out: 1575 [Urine:1575] This shift:  Total I/O In: 222 [P.O.:222] Out: 150 [Urine:150]  Physical Exam: General: Pt awake/alert/oriented x4 in no acute distress  Abdomen: Soft. Nondistended. Mildly tender at incisions only. Incisions are c/d/i. No evidence of peritonitis. No incarcerated hernias.    Problem List:   Principal Problem:   Biliary colic Active Problems:   DM (diabetes mellitus), secondary, with peripheral vascular complications (HCC)   Iron deficiency anemia   Essential hypertension   CORONARY ARTERY BYPASS GRAFT, HX OF   Elevated LFTs   Hypokalemia   Chronic combined systolic and diastolic CHF (congestive heart failure) (HCC)   Prolonged Q-T interval on ECG   Left ear pain   Dry eye    Results:   Labs: Results for orders placed or performed during the hospital encounter of 02/13/16 (from the past 48 hour(s))  Glucose, capillary     Status: Abnormal   Collection Time: 02/20/16 11:59 AM  Result Value Ref Range   Glucose-Capillary 171 (H) 65 - 99 mg/dL   Comment 1 Notify RN    Comment 2 Document in Chart   Glucose, capillary     Status: Abnormal   Collection Time: 02/20/16   4:41 PM  Result Value Ref Range   Glucose-Capillary 149 (H) 65 - 99 mg/dL   Comment 1 Notify RN    Comment 2 Document in Chart   Glucose, capillary     Status: Abnormal   Collection Time: 02/20/16  8:29 PM  Result Value Ref Range   Glucose-Capillary 111 (H) 65 - 99 mg/dL  CBC with Differential/Platelet     Status: Abnormal   Collection Time: 02/21/16  5:13 AM  Result Value Ref Range   WBC 9.6 4.0 - 10.5 K/uL    Comment: WHITE COUNT CONFIRMED ON SMEAR REPEATED TO VERIFY    RBC 4.24 4.22 - 5.81 MIL/uL   Hemoglobin 11.8 (L) 13.0 - 17.0 g/dL   HCT 36.4 (L) 39.0 - 52.0 %   MCV 85.8 78.0 - 100.0 fL   MCH 27.8 26.0 - 34.0 pg   MCHC 32.4 30.0 - 36.0 g/dL   RDW 19.3 (H) 11.5 - 15.5 %   Platelets 181 150 - 400 K/uL    Comment: PLATELET COUNT CONFIRMED BY SMEAR REPEATED TO VERIFY    Neutrophils Relative % 71 %   Lymphocytes Relative 11 %   Monocytes Relative 12 %   Eosinophils Relative 6 %   Basophils Relative 0 %  Neutro Abs 6.7 1.7 - 7.7 K/uL   Lymphs Abs 1.1 0.7 - 4.0 K/uL   Monocytes Absolute 1.2 (H) 0.1 - 1.0 K/uL   Eosinophils Absolute 0.6 0.0 - 0.7 K/uL   Basophils Absolute 0.0 0.0 - 0.1 K/uL  Comprehensive metabolic panel     Status: Abnormal   Collection Time: 02/21/16  5:13 AM  Result Value Ref Range   Sodium 136 135 - 145 mmol/L   Potassium 4.7 3.5 - 5.1 mmol/L    Comment: SLIGHT HEMOLYSIS   Chloride 102 101 - 111 mmol/L   CO2 23 22 - 32 mmol/L   Glucose, Bld 110 (H) 65 - 99 mg/dL   BUN 8 6 - 20 mg/dL   Creatinine, Ser 0.91 0.61 - 1.24 mg/dL   Calcium 8.2 (L) 8.9 - 10.3 mg/dL   Total Protein 5.2 (L) 6.5 - 8.1 g/dL   Albumin 2.5 (L) 3.5 - 5.0 g/dL   AST 42 (H) 15 - 41 U/L   ALT 76 (H) 17 - 63 U/L   Alkaline Phosphatase 179 (H) 38 - 126 U/L   Total Bilirubin 2.3 (H) 0.3 - 1.2 mg/dL   GFR calc non Af Amer >60 >60 mL/min   GFR calc Af Amer >60 >60 mL/min    Comment: (NOTE) The eGFR has been calculated using the CKD EPI equation. This calculation has not been  validated in all clinical situations. eGFR's persistently <60 mL/min signify possible Chronic Kidney Disease.    Anion gap 11 5 - 15  Magnesium     Status: None   Collection Time: 02/21/16  5:13 AM  Result Value Ref Range   Magnesium 1.9 1.7 - 2.4 mg/dL  Glucose, capillary     Status: Abnormal   Collection Time: 02/21/16  7:41 AM  Result Value Ref Range   Glucose-Capillary 118 (H) 65 - 99 mg/dL  Glucose, capillary     Status: Abnormal   Collection Time: 02/21/16 11:03 AM  Result Value Ref Range   Glucose-Capillary 168 (H) 65 - 99 mg/dL  Glucose, capillary     Status: Abnormal   Collection Time: 02/21/16  3:52 PM  Result Value Ref Range   Glucose-Capillary 159 (H) 65 - 99 mg/dL  Glucose, capillary     Status: Abnormal   Collection Time: 02/21/16  8:56 PM  Result Value Ref Range   Glucose-Capillary 154 (H) 65 - 99 mg/dL   Comment 1 Notify RN    Comment 2 Document in Chart   CBC with Differential/Platelet     Status: Abnormal   Collection Time: 02/22/16  3:30 AM  Result Value Ref Range   WBC 7.5 4.0 - 10.5 K/uL   RBC 4.34 4.22 - 5.81 MIL/uL   Hemoglobin 11.8 (L) 13.0 - 17.0 g/dL   HCT 37.8 (L) 39.0 - 52.0 %   MCV 87.1 78.0 - 100.0 fL   MCH 27.2 26.0 - 34.0 pg   MCHC 31.2 30.0 - 36.0 g/dL   RDW 19.1 (H) 11.5 - 15.5 %   Platelets 211 150 - 400 K/uL   Neutrophils Relative % 66 %   Neutro Abs 4.9 1.7 - 7.7 K/uL   Lymphocytes Relative 15 %   Lymphs Abs 1.1 0.7 - 4.0 K/uL   Monocytes Relative 13 %   Monocytes Absolute 1.0 0.1 - 1.0 K/uL   Eosinophils Relative 6 %   Eosinophils Absolute 0.4 0.0 - 0.7 K/uL   Basophils Relative 0 %   Basophils Absolute 0.0 0.0 -  0.1 K/uL  Comprehensive metabolic panel     Status: Abnormal   Collection Time: 02/22/16  3:30 AM  Result Value Ref Range   Sodium 139 135 - 145 mmol/L   Potassium 3.6 3.5 - 5.1 mmol/L   Chloride 103 101 - 111 mmol/L   CO2 29 22 - 32 mmol/L   Glucose, Bld 90 65 - 99 mg/dL   BUN 9 6 - 20 mg/dL   Creatinine, Ser  0.88 0.61 - 1.24 mg/dL   Calcium 8.3 (L) 8.9 - 10.3 mg/dL   Total Protein 5.7 (L) 6.5 - 8.1 g/dL   Albumin 2.7 (L) 3.5 - 5.0 g/dL   AST 22 15 - 41 U/L   ALT 56 17 - 63 U/L   Alkaline Phosphatase 164 (H) 38 - 126 U/L   Total Bilirubin 1.5 (H) 0.3 - 1.2 mg/dL   GFR calc non Af Amer >60 >60 mL/min   GFR calc Af Amer >60 >60 mL/min    Comment: (NOTE) The eGFR has been calculated using the CKD EPI equation. This calculation has not been validated in all clinical situations. eGFR's persistently <60 mL/min signify possible Chronic Kidney Disease.    Anion gap 7 5 - 15  Magnesium     Status: None   Collection Time: 02/22/16  3:30 AM  Result Value Ref Range   Magnesium 2.1 1.7 - 2.4 mg/dL  Glucose, capillary     Status: None   Collection Time: 02/22/16  6:28 AM  Result Value Ref Range   Glucose-Capillary 88 65 - 99 mg/dL    Imaging / Studies: No results found.  Medications / Allergies:  Scheduled Meds: . aspirin EC  81 mg Oral Daily  . atorvastatin  40 mg Oral QODAY  . baclofen  20 mg Oral BID  . carvedilol  12.5 mg Oral BID WC  . clopidogrel  75 mg Oral Daily  . heparin  5,000 Units Subcutaneous Q8H  . insulin aspart  0-5 Units Subcutaneous QHS  . insulin aspart  0-9 Units Subcutaneous TID WC  . loratadine  10 mg Oral Daily  . metoCLOPramide (REGLAN) injection  10 mg Intravenous Q6H  . pneumococcal 23 valent vaccine  0.5 mL Intramuscular Tomorrow-1000  . polyethylene glycol  17 g Oral Daily  . senna-docusate  1 tablet Oral QHS   Continuous Infusions:  PRN Meds:.acetaminophen **OR** [DISCONTINUED] acetaminophen, hydrALAZINE, hydroxypropyl methylcellulose / hypromellose, meclizine, metoprolol, nitroGLYCERIN, oxyCODONE-acetaminophen **AND** oxyCODONE  Antibiotics: Anti-infectives    Start     Dose/Rate Route Frequency Ordered Stop   02/15/16 0830  ampicillin-sulbactam (UNASYN) 1.5 g in sodium chloride 0.9 % 50 mL IVPB  Status:  Discontinued     1.5 g 100 mL/hr over 30  Minutes Intravenous Every 6 hours 02/15/16 0822 02/20/16 1144   02/14/16 0930  piperacillin-tazobactam (ZOSYN) IVPB 3.375 g  Status:  Discontinued     3.375 g 12.5 mL/hr over 240 Minutes Intravenous Every 8 hours 02/14/16 0922 02/15/16 0822   02/13/16 1430  piperacillin-tazobactam (ZOSYN) IVPB 3.375 g     3.375 g 100 mL/hr over 30 Minutes Intravenous  Once 02/13/16 1417 02/13/16 1457        Assessment/Plan Cholangitis Choledocholithiasis  POD#3 laparoscopic cholecystectomy---Dr. Ninfa Linden -LFTs are trending down, pain is better controlled, continue mobilization, diet, miralax. -stable for DC from a surgical standpoint.  Follow up arranged and instructions provided.  VTE prophylaxis-SCD/heparin Dispo-surgically stable for DC   Erby Pian, [facility] Surgery Pager [phone](7A-4:30P) For consults and floor  pages call [phone](7A-4:30P)  02/22/2016 9:35 AM

## 2016-02-22 NOTE — Discharge Instructions (Signed)

## 2016-02-22 NOTE — Progress Notes (Signed)
Discharge instructions given . Verbalized understanding Wheeled to lobby by NT with family in attendance 1430pm

## 2016-02-22 NOTE — Care Management Important Message (Signed)
Important Message  Patient Details  Name: [name] MRN: [id number] Date of Birth: Feb 16, 1946   Medicare Important Message Given:  Yes    Hanley Hays, RN 02/22/2016, 1:43 PM

## 2016-02-22 NOTE — Progress Notes (Signed)
Pt ambulated with RN about 150 ft into the hallway. O2 weaned to room air. Will continue to monitor.

## 2016-02-22 NOTE — Care Management Note (Signed)
Case Management Note  Patient Details  Name: [name] MRN: [id number] Date of Birth: [birth year]  Subjective/Objective:     Adm w biliary colic               Action/Plan: lives w wife, wife has [facility] w [facility]   Expected Discharge Date:                  Expected Discharge Plan:  Home w Home Health Services  In-House Referral:     Discharge planning Services  CM Consult  Post Acute Care Choice:  Home Health Choice offered to:  Patient  DME Arranged:    DME Agency:     HH Arranged:  PT HH Agency:  [facility]  Status of Service:  Completed, signed off  Medicare Important Message Given:  Yes Date Medicare IM Given:    Medicare IM give by:    Date Additional Medicare IM Given:    Additional Medicare Important Message give by:     If discussed at Long Length of Stay Meetings, dates discussed:    Additional Comments: gave pt list of [place] [facility] agency list. Pt wants [facility] that's who is wife uses. [facility] for [facility]pt. Pt for dc today.  Hanley Hays, RN 02/22/2016, 8:30 AM

## 2016-02-24 NOTE — Discharge Summary (Signed)
[facility] Discharge Summary   Patient: [name] ZOX:[id number]   PCP: [name], DO DOB: 06-Jun-1946   Date of admission: 02/13/2016   Date of discharge: 02/22/2016     Discharge Diagnoses:  Principal Problem:   Biliary colic Active Problems:   DM (diabetes mellitus), secondary, with peripheral vascular complications (HCC)   Iron deficiency anemia   Essential hypertension   CORONARY ARTERY BYPASS GRAFT, HX OF   Elevated LFTs   Hypokalemia   Chronic combined systolic and diastolic CHF (congestive heart failure) (HCC)   Prolonged Q-T interval on ECG   Left ear pain   Dry eye  Recommendations for Outpatient Follow-up:  1. Please follow-up with PCP with liver function test in one week 2. Please follow-up with [facility] as scheduled. 3. Please follow a low-fat diet.   Follow-up Information    Follow up with CYNTHIA BUTLER, DO. Go on 03/03/2016.   Why:  with liver function test at 9:45   Contact information:   [address] [place] [address] [phone]       Follow up with [facility]. Schedule an appointment as soon as possible for a visit on 03/12/2016.   Specialty:  General Surgery   Why:  arrive by 9:15AM for a 9:45AM post op check with dr. Eliberto Ivory physician assistant    Contact information:   [address] [place] [address] [phone]      Diet recommendation: Low-fat diet  Activity: The patient is advised to gradually reintroduce usual activities.  Discharge Condition: good  History of present illness: As per the H and P dictated on admission, "[name] is a 70 y.o. male with medical history significant for CAD status post remote CABG, chronic combined CHF, iron deficiency anemia, type 2 diabetes mellitus, and paroxysmal atrial fibrillation not anticoagulated due to a GI bleed history who presents to the ED for evaluation of generalized weakness, intermittent abdominal pain, and nausea.  Patient reports being in his usual state of health until several days ago when there was insidious development of postprandial abdominal pain and nausea. He had experienced similar symptoms in the past that was attributed to cholecystitis, and while surgery was considered, he was felt to be a poor candidate due to his ischemic cardiomyopathy. Over the past 2 days, his symptoms have worsened significantly and patient has not eaten in the past 2 days. He reports that abdominal pain is improved significantly since he stopped eating, but his weakness has progressed. He endorses some periodic chills, but denies fever. He denies chest pain, palpitations, or dyspnea. There is been no vomiting or diarrhea associated with this.   ED Course: Upon arrival to the ED, patient is found to be afebrile, saturating well on room air, and with vital signs otherwise stable. EKG features a sinus rhythm with first degree AV nodal block and a prolonged QT interval. Ultrasound of the abdomen demonstrates cholelithiasis with minimal wall thickening, no pericholecystic fluid, and no sonographic Murphy sign. CBC is notable for hemoglobin of 12.7 and platelet count of 128,000. CMP features numerous derangements, including potassium of 3.4, total bilirubin of 2.1, and transaminases in the 200s. INR is normal, troponin is undetectable, and lipase is also normal at 16. Urine is obtained for analysis and unremarkable. 1 L of normal saline was bolused in the ED and the patient was treated symptomatically with Zofran. A dose of Zosyn was administered empirically in the emergency room. EDP consulted with GI  and general surgery and admission to the medicine service was advised."  Hospital Course:  Summary of his active problems in the hospital is as following. 1. Biliary colic, Ascending cholangitis  Patient was initially started on empiric Unasyn, GI and surgery was consulted. status post ERCP and CBD stone removal May 22. Status post  laparoscopic cholecystectomy May 23. Postoperatively patient does not have any acute complaint and therefore antibiotic will be discontinued, per surgery. Diet is advanced and the patient is able to tolerate it without any nausea or vomiting. Pain management with Percocet patient mentions he has enough medications at home. Physical therapy recommends home health  2.history of coronary artery disease with CABG as well as chronic combined systolic and diastolic CHF, ejection fraction 30%. Has AICD. Resume aspirin and Plavix. Continue carvedilol No evidence of volume overload, in the hospital postoperatively, continue to hold Lasix and Aldactone for 02/21/2016.  3. Type 2 diabetes mellitus. Resuming home medications Hemoglobin A1c on admission 7.3.  4. Dyslipidemia. Resume statin.  5.Atrial fibrillation CHA2DS2-VASc Score 4 Patient is currently rate controlled. We'll continue aspirin and Coreg Not on any anticoagulation due to prior history of GI bleed.  6. Bowel regimen. Discharging on Senokot as well as MiraLAX.  All other chronic medical condition were stable during the hospitalization.  Patient was seen by physical therapy, who recommended home health, which was arranged by Child psychotherapist and case Production designer, theatre/television/film. On the day of the discharge the patient's vitals are stable, and no other acute medical condition were reported by patient. the patient was felt safe to be discharge at home with home health.  Procedures and Results:  Laparoscopic cholecystectomy May 23.  ERCP May 22  Consultations:  Gastroenterology  General surgery  DISCHARGE MEDICATION: Discharge Medication List as of 02/22/2016 12:24 PM    START taking these medications   Details  polyethylene glycol (MIRALAX / GLYCOLAX) packet Take 17 g by mouth daily., Starting 02/22/2016, Until Discontinued, Normal      CONTINUE these medications which have CHANGED   Details  metoCLOPramide (REGLAN) 10 MG tablet Take 1  tablet (10 mg total) by mouth 3 (three) times daily before meals., Starting 02/22/2016, Until Discontinued, Normal      CONTINUE these medications which have NOT CHANGED   Details  aspirin EC 81 MG EC tablet Take 1 tablet (81 mg total) by mouth daily., Starting 07/29/2012, Until Discontinued, No Print    atorvastatin (LIPITOR) 80 MG tablet Take 0.5 tablets (40 mg total) by mouth every other day., Starting 07/20/2014, Until Discontinued, Normal    baclofen (LIORESAL) 20 MG tablet Take 20 mg by mouth 2 (two) times daily. , Until Discontinued, Historical Med    calcium carbonate (TUMS - DOSED IN MG ELEMENTAL CALCIUM) 500 MG chewable tablet Chew 1 tablet by mouth 3 (three) times daily as needed for indigestion or heartburn. , Starting 02/14/2014, Until Discontinued, Historical Med    Canagliflozin (INVOKANA) 300 MG TABS Take 1 tablet by mouth daily., Until Discontinued, Historical Med    carvedilol (COREG) 12.5 MG tablet Take 1 tablet (12.5 mg total) by mouth 2 (two) times daily with a meal., Starting 11/03/2014, Until Discontinued, Normal    clopidogrel (PLAVIX) 75 MG tablet TAKE 1 TABLET EVERY DAY, Normal    furosemide (LASIX) 40 MG tablet Take 40 mg by mouth daily as needed for fluid. , Until Discontinued, Historical Med    glimepiride (AMARYL) 4 MG tablet Take 4 mg by mouth 2 (two) times daily. , Until Discontinued, Historical  Med    linagliptin (TRADJENTA) 5 MG TABS tablet Take 5 mg by mouth daily after breakfast. , Until Discontinued, Historical Med    meclizine (ANTIVERT) 25 MG tablet Take 25 mg by mouth 4 (four) times daily as needed. For dizziness, Starting 02/26/2012, Until Discontinued, Historical Med    nitroGLYCERIN (NITROSTAT) 0.4 MG SL tablet Place 1 tablet (0.4 mg total) under the tongue every 5 (five) minutes as needed for chest pain., Starting 01/02/2016, Until Discontinued, Normal    oxyCODONE-acetaminophen (PERCOCET) 10-325 MG per tablet Take 1 tablet by mouth every 4 (four)  hours as needed for pain. , Starting 01/26/2014, Until Discontinued, Historical Med    RANITIDINE HCL PO Take 1 tablet by mouth daily as needed (heartburn or indigestion). , Starting 12/17/2013, Until Discontinued, Historical Med    SENNA CO Take 1 tablet by mouth daily as needed (constipation). , Starting 01/10/2014, Until Discontinued, Historical Med    spironolactone (ALDACTONE) 25 MG tablet Take 1 tablet (25 mg total) by mouth every morning., Starting 07/20/2014, Until Discontinued, Normal      STOP taking these medications     BISACODYL PO        Allergies  Allergen Reactions  . Codeine Nausea And Vomiting   Discharge Instructions    Diet fat modified    Complete by:  As directed      Discharge instructions    Complete by:  As directed   It is important that you read following instructions as well as go over your medication list with RN to help you understand your care after this hospitalization.  Discharge Instructions: Please follow-up with PCP in one week  Please request your primary care physician to go over all Hospital Tests and Procedure/Radiological results at the follow up,  Please get all Hospital records sent to your PCP by signing hospital release before you go home.   Do not take more than prescribed Pain, Sleep and Anxiety Medications. You were cared for by a hospitalist during your hospital stay. If you have any questions about your discharge medications or the care you received while you were in the hospital after you are discharged, you can call the unit and ask to speak with the hospitalist on call if the hospitalist that took care of you is not available.  Once you are discharged, your primary care physician will handle any further medical issues. Please note that NO REFILLS for any discharge medications will be authorized once you are discharged, as it is imperative that you return to your primary care physician (or establish a relationship with a primary care  physician if you do not have one) for your aftercare needs so that they can reassess your need for medications and monitor your lab values. You Must read complete instructions/literature along with all the possible adverse reactions/side effects for all the Medicines you take and that have been prescribed to you. Take any new Medicines after you have completely understood and accept all the possible adverse reactions/side effects. Wear Seat belts while driving. If you have smoked or chewed Tobacco in the last 2 yrs please stop smoking and/or stop any Recreational drug use.     Increase activity slowly    Complete by:  As directed           Discharge Exam: Filed Weights   02/21/16 0648 02/22/16 0551 02/22/16 0640  Weight: 91.082 kg (200 lb 12.8 oz) 89.404 kg (197 lb 1.6 oz) 90.357 kg (199 lb 3.2 oz)  Filed Vitals:   02/22/16 0835 02/22/16 1127  BP: 142/65 128/53  Pulse: 64 58  Temp:  97.9 F (36.6 C)  Resp:  18   General: Appear in no distress, no Rash; Oral Mucosa moist. Cardiovascular: S1 and S2 Present, no Murmur, no JVD Respiratory: Bilateral Air entry present and Clear to Auscultation, no Crackles, no wheezes Abdomen: Bowel Sound present, Soft and mild tenderness Extremities: no Pedal edema, no calf tenderness Neurology: Grossly no focal neuro deficit.  The results of significant diagnostics from this hospitalization (including imaging, microbiology, ancillary and laboratory) are listed below for reference.    Significant Diagnostic Studies: Ct Abdomen W Contrast  02/15/2016  CLINICAL DATA:  Right upper quadrant pain, gallstones, nausea, evaluate for choledocholithiasis EXAM: CT ABDOMEN WITH CONTRAST TECHNIQUE: Multidetector CT imaging of the abdomen was performed using the standard protocol following bolus administration of intravenous contrast. CONTRAST:  100 mL Isovue 300 IV COMPARISON:  Abdominal ultrasound dated 02/13/2016. CT abdomen pelvis dated 03/06/2013. FINDINGS:  Lower chest: Small bilateral pleural effusions. Mild patchy opacities in the bilateral lower lobes, likely dependent atelectasis. Hepatobiliary: Liver is notable for mild periportal edema. No enhancing hepatic lesions. Mild gallbladder distention. Associated 2.8 cm gallstone. No associated gallbladder wall thickening or inflammatory changes. Dilated common duct, measuring 9 mm distally (series 4/image 26). Associated 7 mm distal CBD stone (series 7/image 22). Pancreas: Within normal limits. No peripancreatic fluid collections. Spleen: Within normal limits. Adrenals/Urinary Tract: Adrenal glands are within normal limits. Kidneys are within normal limits.  No hydronephrosis. Stomach/Bowel: Stomach is within normal limits. Visualized bowel is unremarkable. Vascular/Lymphatic: Atherosclerotic calcifications of the abdominal aorta. No evidence of abdominal aortic aneurysm. No suspicious abdominal lymphadenopathy. Other: No abdominal ascites. Musculoskeletal: Degenerative changes of the visualized thoracolumbar spine. IMPRESSION: Cholelithiasis with mild gallbladder distention. No associated inflammatory changes to suggest acute cholecystitis. Dilated common duct, measuring 9 mm, with suspected 7 mm distal CBD stone. Small bilateral pleural effusions. These results were called by telephone at the time of interpretation on 02/15/2016 at 5:04 pm to Dr. Jonette Eva, who verbally acknowledged these results. Electronically Signed   By: Charline Bills M.D.   On: 02/15/2016 17:40   US Abdomen Complete  02/13/2016  CLINICAL DATA:  Acute generalized abdominal pain. EXAM: ABDOMEN ULTRASOUND COMPLETE COMPARISON:  CT scan of March 06, 2013. FINDINGS: Gallbladder: Multiple gallstones are noted with minimal gallbladder wall thickening measured at 4 mm, but no pericholecystic fluid or sonographic Murphy's sign is noted. The largest calculus measures 2.6 cm in size. Common bile duct: Diameter: 5 mm which is within normal limits.  Liver: No focal lesion identified. Within normal limits in parenchymal echogenicity. IVC: No abnormality visualized. Pancreas: Not visualized due to overlying bowel gas. Spleen: Size and appearance within normal limits. Right Kidney: Length: 12.8 cm. Echogenicity within normal limits. No mass or hydronephrosis visualized. Left Kidney: Length: 11.7 cm. Echogenicity within normal limits. No mass or hydronephrosis visualized. Abdominal aorta: No aneurysm visualized. Other findings: None. IMPRESSION: Cholelithiasis is noted with minimal gallbladder wall thickening, but no pericholecystic fluid or sonographic Murphy's sign is noted. HIDA scan may be performed for further evaluation. Pancreas is not visualized due to overlying bowel gas. No other abnormality seen in the abdomen. Electronically Signed   By: Lupita Raider, M.D.   On: 02/13/2016 14:01   Dg Chest Port 1 View  02/15/2016  CLINICAL DATA:  Congestive heart failure EXAM: PORTABLE CHEST 1 VIEW COMPARISON:  11/18/2013 FINDINGS: Cardiomegaly again noted. Status post CABG. Dual lead  cardiac pacemaker is unchanged in position. No infiltrate or pleural effusion. No pulmonary edema. Surgical clips right supraclavicular region again noted. IMPRESSION: No active disease.  Cardiomegaly.  Status post CABG. Electronically Signed   By: Natasha Mead M.D.   On: 02/15/2016 13:36   Dg Ercp Biliary & Pancreatic Ducts  02/18/2016  CLINICAL DATA:  ERCP for a bile duct stone. EXAM: ERCP TECHNIQUE: Multiple spot images obtained with the fluoroscopic device and submitted for interpretation post-procedure. FLUOROSCOPY TIME:  If the device does not provide the exposure index: Fluoroscopy Time:  5 minutes and 19 seconds Number of Acquired Images:  3 COMPARISON:  Abdominal CT 02/15/2016 FINDINGS: Inflation of a balloon in the distal common bile duct with opacification of the common bile duct and central intrahepatic bile ducts. No large filling defects or stones. IMPRESSION: No large  filling defects or bile duct stones on these images. These images were submitted for radiologic interpretation only. Please see the procedural report for the amount of contrast and the fluoroscopy time utilized. Electronically Signed   By: Richarda Overlie M.D.   On: 02/18/2016 09:45    Microbiology: No results found for this or any previous visit (from the past 240 hour(s)).   Labs: CBC:  Recent Labs Lab 02/18/16 0216 02/19/16 0318 02/20/16 0320 02/21/16 0513 02/22/16 0330  WBC 5.3 5.9 10.5 9.6 7.5  NEUTROABS  --  3.7  --  6.7 4.9  HGB 12.1* 11.8* 12.1* 11.8* 11.8*  HCT 37.5* 37.2* 37.2* 36.4* 37.8*  MCV 84.8 85.5 86.3 85.8 87.1  PLT 170 195 188 181 211   Basic Metabolic Panel:  Recent Labs Lab 02/18/16 0216 02/19/16 0318 02/20/16 0320 02/21/16 0513 02/22/16 0330  NA 142 139 137 136 139  K 3.6 3.5 3.8 4.7 3.6  CL 110 106 102 102 103  CO2 24 27 25 23 29   GLUCOSE 114* 94 125* 110* 90  BUN <5* <5* 6 8 9   CREATININE 0.75 0.86 0.94 0.91 0.88  CALCIUM 8.2* 8.3* 8.2* 8.2* 8.3*  MG  --   --   --  1.9 2.1   Liver Function Tests:  Recent Labs Lab 02/18/16 0216 02/19/16 0318 02/20/16 0320 02/21/16 0513 02/22/16 0330  AST 86* 109* 101* 42* 22  ALT 106* 124* 121* 76* 56  ALKPHOS 247* 238* 207* 179* 164*  BILITOT 2.4* 1.8* 1.9* 2.3* 1.5*  PROT 5.2* 5.1* 5.0* 5.2* 5.7*  ALBUMIN 2.6* 2.6* 2.6* 2.5* 2.7*   No results for input(s): LIPASE, AMYLASE in the last 168 hours. No results for input(s): AMMONIA in the last 168 hours. Cardiac Enzymes: No results for input(s): CKTOTAL, CKMB, CKMBINDEX, TROPONINI in the last 168 hours. BNP (last 3 results)  Recent Labs  02/13/16 1721  BNP 391.0*   CBG:  Recent Labs Lab 02/21/16 1103 02/21/16 1552 02/21/16 2056 02/22/16 0628 02/22/16 1126  GLUCAP 168* 159* 154* 88 120*   Time spent: 30 minutes  Signed:  PATEL, PRANAV  [facility] 02/22/2016 , 5:04 PM

## 2016-02-26 ENCOUNTER — Ambulatory Visit (HOSPITAL_BASED_OUTPATIENT_CLINIC_OR_DEPARTMENT_OTHER): Payer: Medicare HMO

## 2016-02-26 VITALS — BP 138/67 | HR 71 | Temp 98.2°F | Resp 20

## 2016-02-26 DIAGNOSIS — E538 Deficiency of other specified B group vitamins: Secondary | ICD-10-CM

## 2016-02-26 DIAGNOSIS — D509 Iron deficiency anemia, unspecified: Secondary | ICD-10-CM

## 2016-02-26 MED ORDER — CYANOCOBALAMIN 1000 MCG/ML IJ SOLN
1000.0000 ug | Freq: Once | INTRAMUSCULAR | Status: AC
  Administered 2016-02-26: 1000 ug via INTRAMUSCULAR

## 2016-02-26 MED ORDER — SODIUM CHLORIDE 0.9 % IV SOLN: Freq: Once | INTRAVENOUS | Status: DC

## 2016-02-26 NOTE — Patient Instructions (Signed)

## 2016-03-25 ENCOUNTER — Ambulatory Visit (HOSPITAL_BASED_OUTPATIENT_CLINIC_OR_DEPARTMENT_OTHER): Payer: Medicare HMO

## 2016-03-25 VITALS — BP 143/71 | HR 75 | Temp 97.5°F | Resp 20

## 2016-03-25 DIAGNOSIS — E538 Deficiency of other specified B group vitamins: Secondary | ICD-10-CM | POA: Diagnosis not present

## 2016-03-25 DIAGNOSIS — D509 Iron deficiency anemia, unspecified: Secondary | ICD-10-CM

## 2016-03-25 MED ORDER — CYANOCOBALAMIN 1000 MCG/ML IJ SOLN
1000.0000 ug | Freq: Once | INTRAMUSCULAR | Status: AC
  Administered 2016-03-25: 1000 ug via INTRAMUSCULAR

## 2016-03-25 NOTE — Patient Instructions (Signed)

## 2016-04-02 ENCOUNTER — Telehealth: Payer: Self-pay | Admitting: Cardiology

## 2016-04-02 ENCOUNTER — Encounter: Payer: Medicare HMO | Admitting: *Deleted

## 2016-04-02 NOTE — Telephone Encounter (Signed)
LMOVM reminding pt to send remote transmission.   

## 2016-04-04 ENCOUNTER — Encounter: Payer: Self-pay | Admitting: Cardiology

## 2016-04-07 ENCOUNTER — Ambulatory Visit (INDEPENDENT_AMBULATORY_CARE_PROVIDER_SITE_OTHER): Payer: Medicare HMO | Admitting: *Deleted

## 2016-04-07 DIAGNOSIS — I255 Ischemic cardiomyopathy: Secondary | ICD-10-CM | POA: Diagnosis not present

## 2016-04-07 NOTE — Progress Notes (Signed)
Remote ICD transmission.   

## 2016-04-09 ENCOUNTER — Encounter: Payer: Self-pay | Admitting: Cardiology

## 2016-04-09 LAB — CUP PACEART REMOTE DEVICE CHECK
Battery Voltage: 3.16 V
Brady Statistic AP VP Percent: 1 %
Brady Statistic AP VS Percent: 73 %
Brady Statistic AS VS Percent: 26 %
HIGH POWER IMPEDANCE MEASURED VALUE: 65 Ohm
HighPow Impedance: 65 Ohm
Implantable Lead Implant Date: 20090708
Implantable Lead Location: 753859
Lead Channel Setting Pacing Amplitude: 2 V
Lead Channel Setting Pacing Amplitude: 2.5 V
MDC IDC LEAD IMPLANT DT: 20090708
MDC IDC LEAD LOCATION: 753860
MDC IDC LEAD MODEL: 7122
MDC IDC MSMT BATTERY REMAINING LONGEVITY: 79 mo
MDC IDC MSMT BATTERY REMAINING PERCENTAGE: 91 %
MDC IDC MSMT LEADCHNL RA IMPEDANCE VALUE: 340 Ohm
MDC IDC MSMT LEADCHNL RA SENSING INTR AMPL: 2.1 mV
MDC IDC MSMT LEADCHNL RV IMPEDANCE VALUE: 350 Ohm
MDC IDC MSMT LEADCHNL RV SENSING INTR AMPL: 12 mV
MDC IDC PG SERIAL: 7306179
MDC IDC SESS DTM: 20170708083552
MDC IDC SET LEADCHNL RV PACING PULSEWIDTH: 0.9 ms
MDC IDC SET LEADCHNL RV SENSING SENSITIVITY: 0.5 mV
MDC IDC STAT BRADY AS VP PERCENT: 1 %
MDC IDC STAT BRADY RA PERCENT PACED: 72 %
MDC IDC STAT BRADY RV PERCENT PACED: 1 %

## 2016-04-21 ENCOUNTER — Other Ambulatory Visit: Payer: Self-pay | Admitting: Medical Oncology

## 2016-04-22 ENCOUNTER — Ambulatory Visit (HOSPITAL_BASED_OUTPATIENT_CLINIC_OR_DEPARTMENT_OTHER): Payer: Medicare HMO

## 2016-04-22 VITALS — BP 117/62 | HR 60 | Temp 97.9°F | Resp 18

## 2016-04-22 DIAGNOSIS — E538 Deficiency of other specified B group vitamins: Secondary | ICD-10-CM | POA: Diagnosis not present

## 2016-04-22 DIAGNOSIS — D509 Iron deficiency anemia, unspecified: Secondary | ICD-10-CM

## 2016-04-22 MED ORDER — CYANOCOBALAMIN 1000 MCG/ML IJ SOLN
1000.0000 ug | Freq: Once | INTRAMUSCULAR | Status: AC
  Administered 2016-04-22: 1000 ug via INTRAMUSCULAR

## 2016-04-22 NOTE — Patient Instructions (Signed)

## 2016-05-20 ENCOUNTER — Ambulatory Visit (HOSPITAL_BASED_OUTPATIENT_CLINIC_OR_DEPARTMENT_OTHER): Payer: Medicare HMO

## 2016-05-20 VITALS — BP 149/71 | HR 64 | Temp 97.6°F | Resp 20

## 2016-05-20 DIAGNOSIS — E538 Deficiency of other specified B group vitamins: Secondary | ICD-10-CM

## 2016-05-20 DIAGNOSIS — D509 Iron deficiency anemia, unspecified: Secondary | ICD-10-CM

## 2016-05-20 MED ORDER — CYANOCOBALAMIN 1000 MCG/ML IJ SOLN
1000.0000 ug | Freq: Once | INTRAMUSCULAR | Status: AC
  Administered 2016-05-20: 1000 ug via INTRAMUSCULAR

## 2016-05-20 NOTE — Patient Instructions (Signed)

## 2016-06-17 ENCOUNTER — Ambulatory Visit (HOSPITAL_BASED_OUTPATIENT_CLINIC_OR_DEPARTMENT_OTHER): Payer: Medicare HMO

## 2016-06-17 ENCOUNTER — Other Ambulatory Visit (HOSPITAL_BASED_OUTPATIENT_CLINIC_OR_DEPARTMENT_OTHER): Payer: Medicare HMO

## 2016-06-17 VITALS — BP 137/66 | HR 60 | Temp 97.7°F | Resp 18

## 2016-06-17 DIAGNOSIS — D509 Iron deficiency anemia, unspecified: Secondary | ICD-10-CM

## 2016-06-17 DIAGNOSIS — D519 Vitamin B12 deficiency anemia, unspecified: Secondary | ICD-10-CM

## 2016-06-17 DIAGNOSIS — E538 Deficiency of other specified B group vitamins: Secondary | ICD-10-CM

## 2016-06-17 LAB — CBC WITH DIFFERENTIAL/PLATELET
BASO%: 0.5 % (ref 0.0–2.0)
Basophils Absolute: 0 10*3/uL (ref 0.0–0.1)
EOS ABS: 0.2 10*3/uL (ref 0.0–0.5)
EOS%: 2.9 % (ref 0.0–7.0)
HEMATOCRIT: 43.5 % (ref 38.4–49.9)
HGB: 14.2 g/dL (ref 13.0–17.1)
LYMPH#: 1.4 10*3/uL (ref 0.9–3.3)
LYMPH%: 22.1 % (ref 14.0–49.0)
MCH: 29.9 pg (ref 27.2–33.4)
MCHC: 32.8 g/dL (ref 32.0–36.0)
MCV: 91.1 fL (ref 79.3–98.0)
MONO#: 0.6 10*3/uL (ref 0.1–0.9)
MONO%: 9 % (ref 0.0–14.0)
NEUT%: 65.5 % (ref 39.0–75.0)
NEUTROS ABS: 4.2 10*3/uL (ref 1.5–6.5)
PLATELETS: 192 10*3/uL (ref 140–400)
RBC: 4.77 10*6/uL (ref 4.20–5.82)
RDW: 14.7 % — ABNORMAL HIGH (ref 11.0–14.6)
WBC: 6.5 10*3/uL (ref 4.0–10.3)

## 2016-06-17 LAB — COMPREHENSIVE METABOLIC PANEL
ALBUMIN: 3.8 g/dL (ref 3.5–5.0)
ALK PHOS: 100 U/L (ref 40–150)
ALT: 11 U/L (ref 0–55)
ANION GAP: 11 meq/L (ref 3–11)
AST: 14 U/L (ref 5–34)
BILIRUBIN TOTAL: 0.45 mg/dL (ref 0.20–1.20)
BUN: 15.6 mg/dL (ref 7.0–26.0)
CALCIUM: 9.5 mg/dL (ref 8.4–10.4)
CO2: 26 meq/L (ref 22–29)
CREATININE: 1 mg/dL (ref 0.7–1.3)
Chloride: 106 mEq/L (ref 98–109)
EGFR: 73 mL/min/{1.73_m2} — AB (ref >90)
Glucose: 116 mg/dl (ref 70–140)
Potassium: 3.9 mEq/L (ref 3.5–5.1)
Sodium: 143 mEq/L (ref 136–145)
TOTAL PROTEIN: 7 g/dL (ref 6.4–8.3)

## 2016-06-17 LAB — IRON AND TIBC
%SAT: 11 % — ABNORMAL LOW (ref 20–55)
IRON: 40 ug/dL — AB (ref 42–163)
TIBC: 368 ug/dL (ref 202–409)
UIBC: 328 ug/dL (ref 117–376)

## 2016-06-17 LAB — FERRITIN: Ferritin: 31 ng/ml (ref 22–316)

## 2016-06-17 MED ORDER — CYANOCOBALAMIN 1000 MCG/ML IJ SOLN
1000.0000 ug | Freq: Once | INTRAMUSCULAR | Status: AC
  Administered 2016-06-17: 1000 ug via INTRAMUSCULAR

## 2016-06-17 NOTE — Patient Instructions (Signed)

## 2016-06-18 LAB — VITAMIN B12: VITAMIN B 12: 376 pg/mL (ref 211–946)

## 2016-07-07 ENCOUNTER — Encounter: Payer: Medicare HMO | Admitting: *Deleted

## 2016-07-07 ENCOUNTER — Telehealth: Payer: Self-pay | Admitting: Cardiology

## 2016-07-07 NOTE — Telephone Encounter (Signed)
LMOVM reminding pt to send remote transmission.   

## 2016-07-11 ENCOUNTER — Encounter: Payer: Self-pay | Admitting: Cardiology

## 2016-07-14 ENCOUNTER — Other Ambulatory Visit: Payer: Self-pay | Admitting: *Deleted

## 2016-07-15 ENCOUNTER — Other Ambulatory Visit: Payer: Self-pay | Admitting: Oncology

## 2016-07-15 ENCOUNTER — Ambulatory Visit (HOSPITAL_BASED_OUTPATIENT_CLINIC_OR_DEPARTMENT_OTHER): Payer: Medicare HMO

## 2016-07-15 ENCOUNTER — Telehealth: Payer: Self-pay | Admitting: Oncology

## 2016-07-15 ENCOUNTER — Ambulatory Visit (HOSPITAL_BASED_OUTPATIENT_CLINIC_OR_DEPARTMENT_OTHER): Payer: Medicare HMO | Admitting: Oncology

## 2016-07-15 ENCOUNTER — Other Ambulatory Visit (HOSPITAL_BASED_OUTPATIENT_CLINIC_OR_DEPARTMENT_OTHER): Payer: Medicare HMO

## 2016-07-15 VITALS — BP 151/75 | HR 57 | Temp 97.5°F | Resp 18 | Ht 73.0 in | Wt 209.7 lb

## 2016-07-15 DIAGNOSIS — D509 Iron deficiency anemia, unspecified: Secondary | ICD-10-CM

## 2016-07-15 DIAGNOSIS — D519 Vitamin B12 deficiency anemia, unspecified: Secondary | ICD-10-CM | POA: Diagnosis not present

## 2016-07-15 DIAGNOSIS — Z8673 Personal history of transient ischemic attack (TIA), and cerebral infarction without residual deficits: Secondary | ICD-10-CM

## 2016-07-15 LAB — CBC WITH DIFFERENTIAL/PLATELET
BASO%: 0.4 % (ref 0.0–2.0)
Basophils Absolute: 0 10*3/uL (ref 0.0–0.1)
EOS ABS: 0.2 10*3/uL (ref 0.0–0.5)
EOS%: 3.2 % (ref 0.0–7.0)
HCT: 43.6 % (ref 38.4–49.9)
HGB: 14.3 g/dL (ref 13.0–17.1)
LYMPH%: 24.6 % (ref 14.0–49.0)
MCH: 30 pg (ref 27.2–33.4)
MCHC: 32.9 g/dL (ref 32.0–36.0)
MCV: 91.3 fL (ref 79.3–98.0)
MONO#: 0.8 10*3/uL (ref 0.1–0.9)
MONO%: 10.2 % (ref 0.0–14.0)
NEUT#: 4.8 10*3/uL (ref 1.5–6.5)
NEUT%: 61.6 % (ref 39.0–75.0)
Platelets: 197 10*3/uL (ref 140–400)
RBC: 4.77 10*6/uL (ref 4.20–5.82)
RDW: 14.7 % — ABNORMAL HIGH (ref 11.0–14.6)
WBC: 7.8 10*3/uL (ref 4.0–10.3)
lymph#: 1.9 10*3/uL (ref 0.9–3.3)

## 2016-07-15 MED ORDER — CYANOCOBALAMIN 1000 MCG/ML IJ SOLN
1000.0000 ug | Freq: Once | INTRAMUSCULAR | Status: AC
  Administered 2016-07-15: 1000 ug via INTRAMUSCULAR

## 2016-07-15 NOTE — Progress Notes (Signed)
[facility] HEMATOLOGY OFFICE PROGRESS NOTE 07/15/16   [name], DO [address] [place] [address]  DIAGNOSIS: 70 year old gentleman with multifactorial anemia. He has an element of iron deficiency, B12 and anemia of chronic disease. This was diagnosed in 2008.    CURRENT THERAPY: Monthly Vitamin B-12 1000 mcg IM injection. These injections were restarted on 08/05/2012.  INTERVAL HISTORY:  Mr. Whiteeagle presents today for a follow-up visit by himself. Since the last visit, he was hospitalized in May 2017 for cholangitis as well as congestive heart failure. Since that episode, he has been doing very well. He reports no changes in his health. He continues to receive vitamin B12 injections without complications. He reports these injections do help him to maintain a reasonable energy and performance status.  He still able to drive and attends to activities of daily living. He has not reported any bleeding such as hematochezia or melena. His appetite remains reasonable without any progressive weight loss. He does not take any oral iron at this time because of poor tolerance. He did receive intravenous iron back in April 2017 and does not report any complications associated with it. Despite his previous CVA, he is still able to ambulate without any difficulties and have not had any falls or syncope.   He does not report any headaches, blurry vision, syncope or seizures. He does not report any fevers, chills, sweats or weight loss. He is not report any chest pain, palpitation, orthopnea. He does not report any cough, wheezing or hemoptysis. He does not report any nausea, vomiting, abdominal pain. He does not report any musca skeletal complaints. He does not report any frequency urgency or hesitancy. Remainder review of systems unremarkable.   ALLERGIES:  is allergic to codeine.  MEDICATIONS:   Current Outpatient Prescriptions  Medication Sig Dispense Refill  . aspirin EC 81  MG EC tablet Take 1 tablet (81 mg total) by mouth daily.    [name] atorvastatin (LIPITOR) 80 MG tablet Take 0.5 tablets (40 mg total) by mouth every other day. 90 tablet 3  . baclofen (LIORESAL) 20 MG tablet Take 20 mg by mouth 2 (two) times daily.     . calcium carbonate (TUMS - DOSED IN MG ELEMENTAL CALCIUM) 500 MG chewable tablet Chew 1 tablet by mouth 3 (three) times daily as needed for indigestion or heartburn.     . Canagliflozin (INVOKANA) 300 MG TABS Take 1 tablet by mouth daily.    . carvedilol (COREG) 12.5 MG tablet Take 1 tablet (12.5 mg total) by mouth 2 (two) times daily with a meal. 180 tablet 3  . clopidogrel (PLAVIX) 75 MG tablet TAKE 1 TABLET EVERY DAY 90 tablet 3  . furosemide (LASIX) 40 MG tablet Take 40 mg by mouth daily as needed for fluid.     [name] glimepiride (AMARYL) 4 MG tablet Take 4 mg by mouth 2 (two) times daily.     [name] linagliptin (TRADJENTA) 5 MG TABS tablet Take 5 mg by mouth daily after breakfast.     . meclizine (ANTIVERT) 25 MG tablet Take 25 mg by mouth 4 (four) times daily as needed. For dizziness    . metoCLOPramide (REGLAN) 10 MG tablet Take 1 tablet (10 mg total) by mouth 3 (three) times daily before meals. 15 tablet 0  . nitroGLYCERIN (NITROSTAT) 0.4 MG SL tablet Place 1 tablet (0.4 mg total) under the tongue every 5 (five) minutes as needed for chest pain. 25 tablet 4  . oxyCODONE-acetaminophen (PERCOCET) 10-325  MG per tablet Take 1 tablet by mouth every 4 (four) hours as needed for pain.     . polyethylene glycol (MIRALAX / GLYCOLAX) packet Take 17 g by mouth daily. 14 each 0  . RANITIDINE HCL PO Take 1 tablet by mouth daily as needed (heartburn or indigestion).     . SENNA CO Take 1 tablet by mouth daily as needed (constipation).     [name]. spironolactone (ALDACTONE) 25 MG tablet Take 1 tablet (25 mg total) by mouth every morning. 90 tablet 3   No current facility-administered medications for this visit.      PHYSICAL EXAMINATION: ECOG PERFORMANCE STATUS:  1  Blood pressure (!) 151/75, pulse (!) 57, temperature 97.5 F (36.4 C), temperature source Oral, resp. rate 18, height 6\' 1"  (1.854 m), weight 209 lb 11.2 oz (95.1 kg), SpO2 100 %.  GENERAL:Well-appearing gentleman without distress. SKIN:, no petechiae or rash. EYES: Conjunctiva are pink and non-injected.  OROPHARYNX: No oral thrush noted.  NECK: Scars well healed on right neck area.  No thyromegaly. LYMPH:  no palpable lymphadenopathy in the cervical, axillary or supraclavicular LUNGS: clear to auscultation and percussion with normal breathing effort no dullness to percussion. HEART: regular rate & rhythm and no murmurs and no lower extremity edema;+ Implanted ICD on the right infraclavicular area.   ABDOMEN:abdomen soft, non-tender and normal bowel sounds. No rebound or guarding. Musculoskeletal:no cyanosis of digits and no clubbing  NEURO: Right-sided weakness noted. Ambulation was normal.    Labs:  Lab Results  Component Value Date   WBC 7.8 07/15/2016   HGB 14.3 07/15/2016   HCT 43.6 07/15/2016   MCV 91.3 07/15/2016   PLT 197 07/15/2016   NEUTROABS 4.8 07/15/2016      Chemistry      Component Value Date/Time   NA 143 06/17/2016 0826   K 3.9 06/17/2016 0826   CL 103 02/22/2016 0330   CL 100 01/06/2013 0830   CO2 26 06/17/2016 0826   BUN 15.6 06/17/2016 0826   CREATININE 1.0 06/17/2016 0826      Component Value Date/Time   CALCIUM 9.5 06/17/2016 0826   ALKPHOS 100 06/17/2016 0826   AST 14 06/17/2016 0826   ALT 11 06/17/2016 0826   BILITOT 0.45 06/17/2016 0826       Assessment and plan:    70 year old gentleman with the following issues:   1. Anemia: B-12 deficiency As well as iron deficiency. He continues to receive vitamin B12 injections on a monthly basis which has helped his hemoglobin reasonably well. His B12 levels remain maintained with is monthly injections. The plan is to continue with 1000 g on a monthly basis.  His iron deficiency also has been  improved with intravenous iron in April 2017. The plan is to continue with periodic iron monitoring and repeat IV iron infusion as needed.  2. History of stroke: He continues to have residual deficits on the right which has not changed.  3. Follow-up: Will be in 6 months sooner if needed to.   [facility] IncHADAD,Jlynn Langille MD 07/15/16

## 2016-07-15 NOTE — Telephone Encounter (Signed)
Scheduled all injections, labs and foloow up for the next 6 months as ordered in 07/15/16 los. Message sent to infusion scheduler to be added to schedule. Avs report and appointment schedule given to patient,per 07/15/16 los.

## 2016-07-15 NOTE — Patient Instructions (Signed)

## 2016-08-12 ENCOUNTER — Ambulatory Visit (HOSPITAL_BASED_OUTPATIENT_CLINIC_OR_DEPARTMENT_OTHER): Payer: Medicare HMO

## 2016-08-12 VITALS — BP 140/75 | HR 60 | Temp 97.8°F | Resp 20

## 2016-08-12 DIAGNOSIS — D519 Vitamin B12 deficiency anemia, unspecified: Secondary | ICD-10-CM

## 2016-08-12 DIAGNOSIS — D509 Iron deficiency anemia, unspecified: Secondary | ICD-10-CM

## 2016-08-12 MED ORDER — CYANOCOBALAMIN 1000 MCG/ML IJ SOLN
1000.0000 ug | Freq: Once | INTRAMUSCULAR | Status: AC
  Administered 2016-08-12: 1000 ug via INTRAMUSCULAR

## 2016-08-12 NOTE — Patient Instructions (Signed)

## 2016-09-02 ENCOUNTER — Other Ambulatory Visit: Payer: Self-pay | Admitting: Physician Assistant

## 2016-09-09 ENCOUNTER — Ambulatory Visit (HOSPITAL_BASED_OUTPATIENT_CLINIC_OR_DEPARTMENT_OTHER): Payer: Medicare HMO

## 2016-09-09 VITALS — BP 140/68 | HR 64 | Temp 97.8°F | Resp 18

## 2016-09-09 DIAGNOSIS — D519 Vitamin B12 deficiency anemia, unspecified: Secondary | ICD-10-CM | POA: Diagnosis not present

## 2016-09-09 DIAGNOSIS — D509 Iron deficiency anemia, unspecified: Secondary | ICD-10-CM

## 2016-09-09 MED ORDER — CYANOCOBALAMIN 1000 MCG/ML IJ SOLN
1000.0000 ug | Freq: Once | INTRAMUSCULAR | Status: AC
  Administered 2016-09-09: 1000 ug via INTRAMUSCULAR

## 2016-09-09 NOTE — Patient Instructions (Signed)

## 2016-10-07 ENCOUNTER — Ambulatory Visit: Payer: Medicare HMO

## 2016-10-07 ENCOUNTER — Ambulatory Visit (HOSPITAL_BASED_OUTPATIENT_CLINIC_OR_DEPARTMENT_OTHER): Payer: Medicare HMO

## 2016-10-07 ENCOUNTER — Telehealth: Payer: Self-pay | Admitting: Oncology

## 2016-10-07 VITALS — BP 134/58 | HR 59 | Temp 97.8°F | Resp 20

## 2016-10-07 DIAGNOSIS — D519 Vitamin B12 deficiency anemia, unspecified: Secondary | ICD-10-CM | POA: Diagnosis not present

## 2016-10-07 DIAGNOSIS — D509 Iron deficiency anemia, unspecified: Secondary | ICD-10-CM

## 2016-10-07 MED ORDER — CYANOCOBALAMIN 1000 MCG/ML IJ SOLN
1000.0000 ug | Freq: Once | INTRAMUSCULAR | Status: AC
  Administered 2016-10-07: 1000 ug via INTRAMUSCULAR

## 2016-10-07 NOTE — Telephone Encounter (Signed)
Patient called to reschedule injection appointment time for 1/9.

## 2016-10-07 NOTE — Patient Instructions (Signed)
Cyanocobalamin, Vitamin B12 injection What is this medicine? CYANOCOBALAMIN (sye an oh koe BAL a min) is a man made form of vitamin B12. Vitamin B12 is used in the growth of healthy blood cells, nerve cells, and proteins in the body. It also helps with the metabolism of fats and carbohydrates. This medicine is used to treat people who can not absorb vitamin B12. This medicine may be used for other purposes; ask your health care provider or pharmacist if you have questions. COMMON BRAND NAME(S): B-12 Compliance Kit, B-12 Injection Kit, Cyomin, LA-12, Nutri-Twelve, Physicians EZ Use B-12, Primabalt What should I tell my health care provider before I take this medicine? They need to know if you have any of these conditions: -kidney disease -Leber's disease -megaloblastic anemia -an unusual or allergic reaction to cyanocobalamin, cobalt, other medicines, foods, dyes, or preservatives -pregnant or trying to get pregnant -breast-feeding How should I use this medicine? This medicine is injected into a muscle or deeply under the skin. It is usually given by a health care professional in a clinic or doctor's office. However, your doctor may teach you how to inject yourself. Follow all instructions. Talk to your pediatrician regarding the use of this medicine in children. Special care may be needed. Overdosage: If you think you have taken too much of this medicine contact a poison control center or emergency room at once. NOTE: This medicine is only for you. Do not share this medicine with others. What if I miss a dose? If you are given your dose at a clinic or doctor's office, call to reschedule your appointment. If you give your own injections and you miss a dose, take it as soon as you can. If it is almost time for your next dose, take only that dose. Do not take double or extra doses. What may interact with this medicine? -colchicine -heavy alcohol intake This list may not describe all possible  interactions. Give your health care provider a list of all the medicines, herbs, non-prescription drugs, or dietary supplements you use. Also tell them if you smoke, drink alcohol, or use illegal drugs. Some items may interact with your medicine. What should I watch for while using this medicine? Visit your doctor or health care professional regularly. You may need blood work done while you are taking this medicine. You may need to follow a special diet. Talk to your doctor. Limit your alcohol intake and avoid smoking to get the best benefit. What side effects may I notice from receiving this medicine? Side effects that you should report to your doctor or health care professional as soon as possible: -allergic reactions like skin rash, itching or hives, swelling of the face, lips, or tongue -blue tint to skin -chest tightness, pain -difficulty breathing, wheezing -dizziness -red, swollen painful area on the leg Side effects that usually do not require medical attention (report to your doctor or health care professional if they continue or are bothersome): -diarrhea -headache This list may not describe all possible side effects. Call your doctor for medical advice about side effects. You may report side effects to FDA at 1-800-FDA-1088. Where should I keep my medicine? Keep out of the reach of children. Store at room temperature between 15 and 30 degrees C (59 and 85 degrees F). Protect from light. Throw away any unused medicine after the expiration date. NOTE: This sheet is a summary. It may not cover all possible information. If you have questions about this medicine, talk to your doctor, pharmacist, or   health care provider.  2017 Elsevier/Gold Standard (2007-12-27 22:10:20)

## 2016-11-04 ENCOUNTER — Ambulatory Visit (HOSPITAL_BASED_OUTPATIENT_CLINIC_OR_DEPARTMENT_OTHER): Payer: Medicare HMO

## 2016-11-04 VITALS — BP 145/69 | HR 65 | Temp 97.0°F | Resp 18

## 2016-11-04 DIAGNOSIS — D519 Vitamin B12 deficiency anemia, unspecified: Secondary | ICD-10-CM

## 2016-11-04 DIAGNOSIS — D509 Iron deficiency anemia, unspecified: Secondary | ICD-10-CM

## 2016-11-04 MED ORDER — CYANOCOBALAMIN 1000 MCG/ML IJ SOLN
1000.0000 ug | Freq: Once | INTRAMUSCULAR | Status: AC
  Administered 2016-11-04: 1000 ug via INTRAMUSCULAR

## 2016-11-04 NOTE — Patient Instructions (Signed)

## 2016-11-19 ENCOUNTER — Other Ambulatory Visit: Payer: Self-pay | Admitting: Internal Medicine

## 2016-12-02 ENCOUNTER — Other Ambulatory Visit (HOSPITAL_BASED_OUTPATIENT_CLINIC_OR_DEPARTMENT_OTHER): Payer: Medicare HMO

## 2016-12-02 ENCOUNTER — Ambulatory Visit (HOSPITAL_BASED_OUTPATIENT_CLINIC_OR_DEPARTMENT_OTHER): Payer: Medicare HMO

## 2016-12-02 DIAGNOSIS — D519 Vitamin B12 deficiency anemia, unspecified: Secondary | ICD-10-CM

## 2016-12-02 DIAGNOSIS — D509 Iron deficiency anemia, unspecified: Secondary | ICD-10-CM | POA: Diagnosis not present

## 2016-12-02 LAB — IRON AND TIBC
%SAT: 14 % — AB (ref 20–55)
Iron: 54 ug/dL (ref 42–163)
TIBC: 386 ug/dL (ref 202–409)
UIBC: 332 ug/dL (ref 117–376)

## 2016-12-02 LAB — CBC WITH DIFFERENTIAL/PLATELET
BASO%: 0.6 % (ref 0.0–2.0)
BASOS ABS: 0 10*3/uL (ref 0.0–0.1)
EOS ABS: 0.2 10*3/uL (ref 0.0–0.5)
EOS%: 3.9 % (ref 0.0–7.0)
HCT: 41.4 % (ref 38.4–49.9)
HGB: 13.5 g/dL (ref 13.0–17.1)
LYMPH%: 24.8 % (ref 14.0–49.0)
MCH: 28.9 pg (ref 27.2–33.4)
MCHC: 32.6 g/dL (ref 32.0–36.0)
MCV: 88.7 fL (ref 79.3–98.0)
MONO#: 0.7 10*3/uL (ref 0.1–0.9)
MONO%: 10.5 % (ref 0.0–14.0)
NEUT%: 60.2 % (ref 39.0–75.0)
NEUTROS ABS: 3.8 10*3/uL (ref 1.5–6.5)
PLATELETS: 188 10*3/uL (ref 140–400)
RBC: 4.67 10*6/uL (ref 4.20–5.82)
RDW: 15.3 % — ABNORMAL HIGH (ref 11.0–14.6)
WBC: 6.3 10*3/uL (ref 4.0–10.3)
lymph#: 1.6 10*3/uL (ref 0.9–3.3)

## 2016-12-02 LAB — FERRITIN: Ferritin: 29 ng/ml (ref 22–316)

## 2016-12-02 MED ORDER — CYANOCOBALAMIN 1000 MCG/ML IJ SOLN
1000.0000 ug | Freq: Once | INTRAMUSCULAR | Status: AC
  Administered 2016-12-02: 1000 ug via INTRAMUSCULAR

## 2016-12-02 NOTE — Patient Instructions (Signed)

## 2016-12-03 LAB — VITAMIN B12: VITAMIN B 12: 410 pg/mL (ref 232–1245)

## 2016-12-30 ENCOUNTER — Telehealth: Payer: Self-pay | Admitting: Oncology

## 2016-12-30 ENCOUNTER — Other Ambulatory Visit (HOSPITAL_BASED_OUTPATIENT_CLINIC_OR_DEPARTMENT_OTHER): Payer: Medicare HMO

## 2016-12-30 ENCOUNTER — Ambulatory Visit (HOSPITAL_BASED_OUTPATIENT_CLINIC_OR_DEPARTMENT_OTHER): Payer: Medicare HMO

## 2016-12-30 ENCOUNTER — Ambulatory Visit (HOSPITAL_BASED_OUTPATIENT_CLINIC_OR_DEPARTMENT_OTHER): Payer: Medicare HMO | Admitting: Oncology

## 2016-12-30 ENCOUNTER — Ambulatory Visit: Payer: Medicare HMO

## 2016-12-30 VITALS — BP 133/64 | HR 58 | Resp 18 | Ht 73.0 in | Wt 217.0 lb

## 2016-12-30 VITALS — BP 120/61 | HR 59 | Temp 97.6°F | Resp 18

## 2016-12-30 DIAGNOSIS — D509 Iron deficiency anemia, unspecified: Secondary | ICD-10-CM | POA: Diagnosis not present

## 2016-12-30 DIAGNOSIS — D519 Vitamin B12 deficiency anemia, unspecified: Secondary | ICD-10-CM

## 2016-12-30 DIAGNOSIS — Z8673 Personal history of transient ischemic attack (TIA), and cerebral infarction without residual deficits: Secondary | ICD-10-CM | POA: Diagnosis not present

## 2016-12-30 DIAGNOSIS — E538 Deficiency of other specified B group vitamins: Secondary | ICD-10-CM

## 2016-12-30 LAB — CBC WITH DIFFERENTIAL/PLATELET
BASO%: 0.2 % (ref 0.0–2.0)
Basophils Absolute: 0 10*3/uL (ref 0.0–0.1)
EOS%: 2.6 % (ref 0.0–7.0)
Eosinophils Absolute: 0.2 10*3/uL (ref 0.0–0.5)
HEMATOCRIT: 39.4 % (ref 38.4–49.9)
HEMOGLOBIN: 12.9 g/dL — AB (ref 13.0–17.1)
LYMPH#: 1.5 10*3/uL (ref 0.9–3.3)
LYMPH%: 24.1 % (ref 14.0–49.0)
MCH: 28.9 pg (ref 27.2–33.4)
MCHC: 32.7 g/dL (ref 32.0–36.0)
MCV: 88.1 fL (ref 79.3–98.0)
MONO#: 0.8 10*3/uL (ref 0.1–0.9)
MONO%: 12.8 % (ref 0.0–14.0)
NEUT#: 3.8 10*3/uL (ref 1.5–6.5)
NEUT%: 60.3 % (ref 39.0–75.0)
PLATELETS: 186 10*3/uL (ref 140–400)
RBC: 4.47 10*6/uL (ref 4.20–5.82)
RDW: 14.6 % (ref 11.0–14.6)
WBC: 6.3 10*3/uL (ref 4.0–10.3)

## 2016-12-30 MED ORDER — CYANOCOBALAMIN 1000 MCG/ML IJ SOLN
1000.0000 ug | Freq: Once | INTRAMUSCULAR | Status: AC
  Administered 2016-12-30: 1000 ug via INTRAMUSCULAR

## 2016-12-30 MED ORDER — SODIUM CHLORIDE 0.9 % IV SOLN
Freq: Once | INTRAVENOUS | Status: AC
  Administered 2016-12-30: 09:00:00 via INTRAVENOUS

## 2016-12-30 MED ORDER — SODIUM CHLORIDE 0.9 % IV SOLN
510.0000 mg | Freq: Once | INTRAVENOUS | Status: AC
  Administered 2016-12-30: 510 mg via INTRAVENOUS
  Filled 2016-12-30: qty 17

## 2016-12-30 NOTE — Patient Instructions (Signed)

## 2016-12-30 NOTE — Patient Instructions (Signed)

## 2016-12-30 NOTE — Telephone Encounter (Signed)
Appointments scheduled per 4.3.18 LOS. Patient given AVS report and calendars with future scheduled appointments.  °

## 2016-12-30 NOTE — Progress Notes (Signed)
Patient tolerated treatment well. Patient and vital signs stable upon discharge.  

## 2016-12-30 NOTE — Progress Notes (Signed)
[facility] HEMATOLOGY OFFICE PROGRESS NOTE 12/30/16   [name], [name] [address] [place] [address]  DIAGNOSIS: 71 year old gentleman with multifactorial anemia. He has an element of iron deficiency, B12 and anemia of chronic disease. This was diagnosed in 2008.    CURRENT THERAPY:  Monthly Vitamin B-12 1000 mcg IM injection. These injections were restarted on 08/05/2012. He also receives intermittent IV iron infusion utilizing Feraheme. He is scheduled to have one 01/26/2017.  INTERVAL HISTORY:  [name] presents today for a follow-up visit. Since the last visit, he reports feeling reasonably well. He continues to attend to activities of daily living including driving and taking care of his wife. He continues to receive vitamin B12 injections without complications. He reports his energy is much improved with these injections. He also received it IV iron intermittently which also helped his symptoms. He denies any hematochezia, melena or epistaxis. His appetite remain excellent.   He does not report any headaches, blurry vision, syncope or seizures. He does not report any fevers, chills, sweats or weight loss. He is not report any chest pain, palpitation, orthopnea. He does not report any cough, wheezing or hemoptysis. He does not report any nausea, vomiting, abdominal pain. He does not report any musca skeletal complaints. He does not report any frequency urgency or hesitancy. Remainder review of systems unremarkable.   ALLERGIES:  is allergic to codeine.  MEDICATIONS:   Current Outpatient Prescriptions  Medication Sig Dispense Refill  . aspirin EC 81 MG EC tablet Take 1 tablet (81 mg total) by mouth daily.    [name] atorvastatin (LIPITOR) 80 MG tablet Take 0.5 tablets (40 mg total) by mouth every other day. 90 tablet 3  . baclofen (LIORESAL) 20 MG tablet Take 20 mg by mouth 2 (two) times daily.     . calcium carbonate (TUMS - DOSED IN MG ELEMENTAL CALCIUM) 500  MG chewable tablet Chew 1 tablet by mouth 3 (three) times daily as needed for indigestion or heartburn.     . Canagliflozin (INVOKANA) 300 MG TABS Take 1 tablet by mouth daily.    . carvedilol (COREG) 12.5 MG tablet Take 1 tablet (12.5 mg total) by mouth 2 (two) times daily with a meal. 180 tablet 3  . clopidogrel (PLAVIX) 75 MG tablet Take 1 tablet (75 mg total) by mouth daily. 90 tablet 0  . furosemide (LASIX) 40 MG tablet Take 40 mg by mouth daily as needed for fluid.     [name] glimepiride (AMARYL) 4 MG tablet Take 4 mg by mouth 2 (two) times daily.     [name] linagliptin (TRADJENTA) 5 MG TABS tablet Take 5 mg by mouth daily after breakfast.     . meclizine (ANTIVERT) 25 MG tablet Take 25 mg by mouth 4 (four) times daily as needed. For dizziness    . metoCLOPramide (REGLAN) 10 MG tablet Take 1 tablet (10 mg total) by mouth 3 (three) times daily before meals. 15 tablet 0  . oxyCODONE-acetaminophen (PERCOCET) 10-325 MG per tablet Take 1 tablet by mouth every 4 (four) hours as needed for pain.     . polyethylene glycol (MIRALAX / GLYCOLAX) packet Take 17 g by mouth daily. 14 each 0  . SENNA CO Take 1 tablet by mouth daily as needed (constipation).     [name] spironolactone (ALDACTONE) 25 MG tablet Take 1 tablet (25 mg total) by mouth every morning. 90 tablet 3  . nitroGLYCERIN (NITROSTAT) 0.4 MG SL tablet Place 1 tablet (  0.4 mg total) under the tongue every 5 (five) minutes as needed for chest pain. (Patient not taking: Reported on 12/30/2016) 25 tablet 4  . RANITIDINE HCL PO Take 1 tablet by mouth daily as needed (heartburn or indigestion).      No current facility-administered medications for this visit.      PHYSICAL EXAMINATION: ECOG PERFORMANCE STATUS: 1  Blood pressure 133/64, pulse (!) 58, resp. rate 18, height 6\' 1"  (1.854 m), weight 217 lb (98.4 kg), SpO2 100 %.  GENERAL:Well-appearing gentleman without distress. SKIN:, no petechiae or rash. EYES: Conjunctiva are pink and non-injected.   OROPHARYNX: No oral thrush noted.  NECK: Scars well healed on right neck area.  No thyromegaly. LYMPH:  no palpable lymphadenopathy in the cervical, axillary or supraclavicular LUNGS: clear to auscultation and percussion with normal breathing effort no dullness to percussion. HEART: regular rate & rhythm and no murmurs and no lower extremity edema;+ Implanted ICD on the right infraclavicular area.   ABDOMEN:abdomen soft, non-tender and normal bowel sounds. No rebound or guarding. Musculoskeletal:no cyanosis of digits and no clubbing  NEURO: Right-sided weakness noted. Ambulation was normal.    Labs:  Lab Results  Component Value Date   WBC 6.3 12/30/2016   HGB 12.9 (L) 12/30/2016   HCT 39.4 12/30/2016   MCV 88.1 12/30/2016   PLT 186 12/30/2016   NEUTROABS 3.8 12/30/2016      Chemistry      Component Value Date/Time   NA 143 06/17/2016 0826   K 3.9 06/17/2016 0826   CL 103 02/22/2016 0330   CL 100 01/06/2013 0830   CO2 26 06/17/2016 0826   BUN 15.6 06/17/2016 0826   CREATININE 1.0 06/17/2016 0826      Component Value Date/Time   CALCIUM 9.5 06/17/2016 0826   ALKPHOS 100 06/17/2016 0826   AST 14 06/17/2016 0826   ALT 11 06/17/2016 0826   BILITOT 0.45 06/17/2016 0826       Assessment and plan:    71 year old gentleman with the following issues:   1. Anemia: B-12 deficiency: He continues to receive vitamin B12 injections on a monthly basis which has helped his hemoglobin reasonably well.   His B12 levels remain maintained with is monthly injections. The plan is to continue with 1000 g on a monthly basis.  2. Iron deficiency anemia: He continues to receive intermittent Feraheme infusion. Iron studies obtained on 12/02/2016 showed iron saturation 14% as well as drifting iron levels and ferritin. Risks and benefits of proceeding with [name] was discussed today and is agreeable to continue to receive that.  Etiology of his iron deficiency is unclear. He is up-to-date  on colon cancer screening including colonoscopy.  3. History of stroke: He continues to have residual deficits on the right which has not changed.  4. Follow-up: Will be in 6 months sooner if needed to.   [facility] MD 12/30/16

## 2017-01-01 ENCOUNTER — Encounter (INDEPENDENT_AMBULATORY_CARE_PROVIDER_SITE_OTHER): Payer: Self-pay

## 2017-01-01 ENCOUNTER — Encounter: Payer: Self-pay | Admitting: Internal Medicine

## 2017-01-01 ENCOUNTER — Ambulatory Visit (INDEPENDENT_AMBULATORY_CARE_PROVIDER_SITE_OTHER): Payer: Medicare HMO | Admitting: Internal Medicine

## 2017-01-01 VITALS — BP 122/60 | HR 60 | Ht 73.0 in | Wt 216.0 lb

## 2017-01-01 DIAGNOSIS — I10 Essential (primary) hypertension: Secondary | ICD-10-CM | POA: Diagnosis not present

## 2017-01-01 DIAGNOSIS — I255 Ischemic cardiomyopathy: Secondary | ICD-10-CM | POA: Diagnosis not present

## 2017-01-01 DIAGNOSIS — Z95810 Presence of automatic (implantable) cardiac defibrillator: Secondary | ICD-10-CM

## 2017-01-01 LAB — CUP PACEART INCLINIC DEVICE CHECK
Brady Statistic RA Percent Paced: 79 %
Brady Statistic RV Percent Paced: 0.53 %
HighPow Impedance: 59.625
Implantable Lead Implant Date: 20090708
Implantable Lead Location: 753860
Implantable Lead Model: 7122
Lead Channel Impedance Value: 337.5 Ohm
Lead Channel Impedance Value: 350 Ohm
Lead Channel Pacing Threshold Amplitude: 1 V
Lead Channel Pacing Threshold Pulse Width: 0.9 ms
Lead Channel Sensing Intrinsic Amplitude: 12 mV
Lead Channel Sensing Intrinsic Amplitude: 2 mV
Lead Channel Setting Pacing Amplitude: 2 V
Lead Channel Setting Pacing Pulse Width: 0.9 ms
Lead Channel Setting Sensing Sensitivity: 0.5 mV
MDC IDC LEAD IMPLANT DT: 20090708
MDC IDC LEAD LOCATION: 753859
MDC IDC MSMT LEADCHNL RA PACING THRESHOLD AMPLITUDE: 0.5 V
MDC IDC MSMT LEADCHNL RA PACING THRESHOLD PULSEWIDTH: 0.5 ms
MDC IDC PG IMPLANT DT: 20170106
MDC IDC PG SERIAL: 7306179
MDC IDC SESS DTM: 20180405100438
MDC IDC SET LEADCHNL RV PACING AMPLITUDE: 2.5 V

## 2017-01-01 NOTE — Patient Instructions (Signed)
Medication Instructions:  Your physician recommends that you continue on your current medications as directed. Please refer to the Current Medication list given to you today.   Labwork: none  Testing/Procedures: none  Follow-Up: Remote monitoring is used to monitor your Pacemaker of ICD from home. This monitoring reduces the number of office visits required to check your device to one time per year. It allows [place] to keep an eye on the functioning of your device to ensure it is working properly. You are scheduled for a device check from home on 04/02/17. You may send your transmission at any time that day. If you have a wireless device, the transmission will be sent automatically. After your physician reviews your transmission, you will receive a postcard with your next transmission date.  Your physician wants you to follow-up in: January 2019 You will receive a reminder letter in the mail two months in advance. If you don't receive a letter, please call our office to schedule the follow-up appointment.   Any Other Special Instructions Will Be Listed Below (If Applicable).     If you need a refill on your cardiac medications before your next appointment, please call your pharmacy.

## 2017-01-01 NOTE — Progress Notes (Signed)
HPI Mr. Burson returns today for followup. He is a very pleasant 71 year old man with an ischemic cardiomyopathy, chronic systolic heart failure, status post ICD implantation. In the interim, he has been stable. He denies chest pain or shortness of breath. He has mild peripheral edema.  He denies syncope or near-syncope. No chest pain. He has had no ICD shock.  Allergies  Allergen Reactions  . Codeine Nausea And Vomiting     Current Outpatient Prescriptions  Medication Sig Dispense Refill  . aspirin EC 81 MG EC tablet Take 1 tablet (81 mg total) by mouth daily.    [name] atorvastatin (LIPITOR) 80 MG tablet Take 0.5 tablets (40 mg total) by mouth every other day. 90 tablet 3  . baclofen (LIORESAL) 20 MG tablet Take 20 mg by mouth 2 (two) times daily.     . calcium carbonate (TUMS - DOSED IN MG ELEMENTAL CALCIUM) 500 MG chewable tablet Chew 1 tablet by mouth 3 (three) times daily as needed for indigestion or heartburn.     . Canagliflozin (INVOKANA) 300 MG TABS Take 1 tablet by mouth daily.    . carvedilol (COREG) 12.5 MG tablet Take 1 tablet (12.5 mg total) by mouth 2 (two) times daily with a meal. 180 tablet 3  . clopidogrel (PLAVIX) 75 MG tablet Take 1 tablet (75 mg total) by mouth daily. 90 tablet 0  . furosemide (LASIX) 40 MG tablet Take 40 mg by mouth daily as needed for fluid.     [name] glimepiride (AMARYL) 4 MG tablet Take 4 mg by mouth 2 (two) times daily.     [name] linagliptin (TRADJENTA) 5 MG TABS tablet Take 5 mg by mouth daily after breakfast.     . meclizine (ANTIVERT) 25 MG tablet Take 25 mg by mouth 4 (four) times daily as needed. For dizziness    . metoCLOPramide (REGLAN) 10 MG tablet Take 1 tablet (10 mg total) by mouth 3 (three) times daily before meals. 15 tablet 0  . nitroGLYCERIN (NITROSTAT) 0.4 MG SL tablet Place 1 tablet (0.4 mg total) under the tongue every 5 (five) minutes as needed for chest pain. 25 tablet 4  . oxyCODONE-acetaminophen (PERCOCET) 10-325 MG per tablet Take 1 tablet  by mouth every 4 (four) hours as needed for pain.     . polyethylene glycol (MIRALAX / GLYCOLAX) packet Take 17 g by mouth daily. 14 each 0  . RANITIDINE HCL PO Take 1 tablet by mouth daily as needed (heartburn or indigestion).     . SENNA CO Take 1 tablet by mouth daily as needed (constipation).     [name] spironolactone (ALDACTONE) 25 MG tablet Take 1 tablet (25 mg total) by mouth every morning. 90 tablet 3   No current facility-administered medications for this visit.      Past Medical History:  Diagnosis Date  . Anemia    Multifactorial  - hx of B12 def and iron deficiency, [facility] heme[facility]  . Automatic implantable cardioverter-defibrillator in situ   . CAD (coronary artery disease)    a. STEMI s/p CABG 07/2007 (had preop cardiogenic shock, IABP, VDRF before surgery)  . Cardiomyopathy, ischemic    a. Chronic systolic CHF s/p St. Jude dual chamber ICD 03/2008.  . Carotid stenosis    a. Prior carotid dz/ surgery. b. Carotid dopp 07/2011 - 0-39% bilaterally.;   c. Doppler 11/13: 40-59% RICA, 0-39% LICA  . Chronic renal insufficiency   . Diabetes mellitus   . GI bleed  Reported history  . Hx of CABG   . Hypertension    [facility]   . Mitral regurgitation   . Myocardial infarction   . Neuromuscular disorder (HCC)    R sided weakness   . Osteoarthritis   . PAF (paroxysmal atrial fibrillation) (HCC)    a. Poor Coumadin candidate due to hx of GI bleed.  . Sinus bradycardia   . Sleep apnea    study done, 10 yrs ago, unable to tolerate CPAP  . Splenomegaly   . Stroke [facility])    a. CVA 1990s - chronic pain in RUE after stroke.  [name] Systolic CHF, chronic (HCC)    Previously taken off lisinopril by primary doctor due to labs  . Tobacco abuse     ROS:   All systems reviewed and negative except as noted in the HPI.   Past Surgical History:  Procedure Laterality Date  . CARDIAC DEFIBRILLATOR PLACEMENT  04/05/2008   Implantation of a St. Jude  dual-chamber defibrillator, Doylene Canning. Ladona Ridgel , MD  . CARDIAC DEFIBRILLATOR PLACEMENT    . CHOLECYSTECTOMY N/A 02/19/2016   Procedure: LAPAROSCOPIC CHOLECYSTECTOMY;  Surgeon: Abigail Miyamoto, MD;  Location: [facility] OR;  Service: General;  Laterality: N/A;  . COLONOSCOPY  07/2007   Rourk: left-sided diverticulum. TI normal. No etiology for IDA.  [name] CORONARY ARTERY BYPASS GRAFT    . CORONARY ARTERY BYPASS GRAFT  08/29/2007   x3; Marilu Favre H. Cornelius Moras MD  . EP IMPLANTABLE DEVICE N/A 10/05/2015   Procedure: ICD Generator Changeout;  Surgeon: Marinus Maw, MD;  Location: [facility] INVASIVE CV LAB;  Service: Cardiovascular;  Laterality: N/A;  . ERCP N/A 02/18/2016   Procedure: ENDOSCOPIC RETROGRADE CHOLANGIOPANCREATOGRAPHY (ERCP);  Surgeon: Vida Rigger, MD;  Location: [facility] ENDOSCOPY;  Service: Endoscopy;  Laterality: N/A;  . ESOPHAGOGASTRODUODENOSCOPY  07/2007   Rourk: small hh, atrophic gastric mucosa, poor distensibility of stomach ?extrinsic compression, CT showed mild to moderate splenomegaly. No etiology for IDA.  [name] JOINT REPLACEMENT  1990's   L knee  . Percutaneous coronary intervention  01/10/2005   Charlies Constable, MD  . TONSILLECTOMY    . TOTAL KNEE ARTHROPLASTY Right 08/16/2013   Procedure: RIGHT TOTAL KNEE ARTHROPLASTY;  Surgeon: Kathryne Hitch, MD;  Location: [facility] OR;  Service: Orthopedics;  Laterality: Right;     Family History  Problem Relation Age of Onset  . Heart attack Mother     CVA, MI  . Diabetes Mother   . Heart attack Father     MI  . Diabetes Sister   . Coronary artery disease Brother     CABG     Social History   Social History  . Marital status: Married    Spouse name: N/A  . Number of children: N/A  . Years of education: N/A   Occupational History  . Retired Unemployed   Social History Main Topics  . Smoking status: Former Smoker    Packs/day: 1.00    Years: 15.00    Quit date: 09/29/1990  . Smokeless tobacco: Former Neurosurgeon    Quit date: 08/09/1988  . Alcohol use  No  . Drug use: No  . Sexual activity: Not on file   Other Topics Concern  . Not on file   Social History Narrative   Lives in [place] with wife   Been on disability since his stroke in the 1990s   Not routinely exercising     BP 122/60   Pulse 60   Ht 6\' 1"  (1.854  m)   Wt 216 lb (98 kg)   SpO2 96%   BMI 28.50 kg/m   Physical Exam:  stable appearing 70 year old man, NAD HEENT: Unremarkable Neck:  7 cm JVD, no thyromegally Back:  No CVA tenderness Lungs:  Clear with no wheezes, rales, or rhonchi. HEART:  Regular rate rhythm, no murmurs, no rubs, no clicks Abd:  soft, positive bowel sounds, no organomegally, no rebound, no guarding Ext:  2 plus pulses, trace peripheral edema, no cyanosis, no clubbing Skin:  No rashes no nodules Neuro:  CN II through XII intact, motor grossly intact  DEVICE  Normal device function.  See PaceArt for details.   Assess/Plan: 1. Chronic systolic heart failure - his symptoms are class 2. He will continue his current meds. 2. ICD - his St. Jude DDD ICD is working normally. He has had no therapies. 3. CAD - he has no anginal symptoms. Will follow.  Leonia Reeves.D.

## 2017-01-21 ENCOUNTER — Other Ambulatory Visit: Payer: Self-pay | Admitting: Internal Medicine

## 2017-01-26 ENCOUNTER — Other Ambulatory Visit: Payer: Self-pay | Admitting: *Deleted

## 2017-01-26 DIAGNOSIS — D509 Iron deficiency anemia, unspecified: Secondary | ICD-10-CM

## 2017-01-27 ENCOUNTER — Other Ambulatory Visit: Payer: Medicare HMO

## 2017-01-27 ENCOUNTER — Ambulatory Visit (HOSPITAL_BASED_OUTPATIENT_CLINIC_OR_DEPARTMENT_OTHER): Payer: Medicare HMO

## 2017-01-27 VITALS — BP 145/81 | HR 59 | Temp 97.4°F | Resp 18

## 2017-01-27 DIAGNOSIS — D509 Iron deficiency anemia, unspecified: Secondary | ICD-10-CM

## 2017-01-27 DIAGNOSIS — D519 Vitamin B12 deficiency anemia, unspecified: Secondary | ICD-10-CM

## 2017-01-27 LAB — COMPREHENSIVE METABOLIC PANEL
ALT: 10 U/L (ref 0–55)
ANION GAP: 9 meq/L (ref 3–11)
AST: 14 U/L (ref 5–34)
Albumin: 4 g/dL (ref 3.5–5.0)
Alkaline Phosphatase: 81 U/L (ref 40–150)
BUN: 16.9 mg/dL (ref 7.0–26.0)
CHLORIDE: 106 meq/L (ref 98–109)
CO2: 27 meq/L (ref 22–29)
Calcium: 9.2 mg/dL (ref 8.4–10.4)
Creatinine: 1 mg/dL (ref 0.7–1.3)
EGFR: 74 mL/min/{1.73_m2} — AB (ref >90)
Glucose: 99 mg/dl (ref 70–140)
Potassium: 4 mEq/L (ref 3.5–5.1)
Sodium: 141 mEq/L (ref 136–145)
Total Bilirubin: 0.49 mg/dL (ref 0.20–1.20)
Total Protein: 6.7 g/dL (ref 6.4–8.3)

## 2017-01-27 LAB — CBC WITH DIFFERENTIAL/PLATELET
BASO%: 0.3 % (ref 0.0–2.0)
Basophils Absolute: 0 10*3/uL (ref 0.0–0.1)
EOS ABS: 0.2 10*3/uL (ref 0.0–0.5)
EOS%: 2.9 % (ref 0.0–7.0)
HCT: 41.6 % (ref 38.4–49.9)
HEMOGLOBIN: 13.8 g/dL (ref 13.0–17.1)
LYMPH%: 23.7 % (ref 14.0–49.0)
MCH: 29.7 pg (ref 27.2–33.4)
MCHC: 33.1 g/dL (ref 32.0–36.0)
MCV: 89.6 fL (ref 79.3–98.0)
MONO#: 0.6 10*3/uL (ref 0.1–0.9)
MONO%: 11.2 % (ref 0.0–14.0)
NEUT%: 61.9 % (ref 39.0–75.0)
NEUTROS ABS: 3.5 10*3/uL (ref 1.5–6.5)
Platelets: 171 10*3/uL (ref 140–400)
RBC: 4.64 10*6/uL (ref 4.20–5.82)
RDW: 17 % — AB (ref 11.0–14.6)
WBC: 5.7 10*3/uL (ref 4.0–10.3)
lymph#: 1.4 10*3/uL (ref 0.9–3.3)

## 2017-01-27 LAB — FERRITIN: FERRITIN: 72 ng/mL (ref 22–316)

## 2017-01-27 LAB — IRON AND TIBC
%SAT: 21 % (ref 20–55)
IRON: 63 ug/dL (ref 42–163)
TIBC: 298 ug/dL (ref 202–409)
UIBC: 235 ug/dL (ref 117–376)

## 2017-01-27 MED ORDER — CYANOCOBALAMIN 1000 MCG/ML IJ SOLN
1000.0000 ug | Freq: Once | INTRAMUSCULAR | Status: AC
  Administered 2017-01-27: 1000 ug via INTRAMUSCULAR

## 2017-01-27 NOTE — Patient Instructions (Signed)

## 2017-01-28 LAB — VITAMIN B12: VITAMIN B 12: 428 pg/mL (ref 232–1245)

## 2017-03-02 ENCOUNTER — Other Ambulatory Visit: Payer: Self-pay | Admitting: *Deleted

## 2017-03-02 ENCOUNTER — Encounter: Payer: Self-pay | Admitting: *Deleted

## 2017-03-02 DIAGNOSIS — D508 Other iron deficiency anemias: Secondary | ICD-10-CM

## 2017-03-02 DIAGNOSIS — D509 Iron deficiency anemia, unspecified: Secondary | ICD-10-CM

## 2017-03-03 ENCOUNTER — Ambulatory Visit (HOSPITAL_BASED_OUTPATIENT_CLINIC_OR_DEPARTMENT_OTHER): Payer: Medicare HMO

## 2017-03-03 ENCOUNTER — Other Ambulatory Visit (HOSPITAL_BASED_OUTPATIENT_CLINIC_OR_DEPARTMENT_OTHER): Payer: Medicare HMO

## 2017-03-03 VITALS — BP 138/69 | HR 61 | Temp 97.2°F | Resp 20

## 2017-03-03 DIAGNOSIS — D508 Other iron deficiency anemias: Secondary | ICD-10-CM

## 2017-03-03 DIAGNOSIS — D519 Vitamin B12 deficiency anemia, unspecified: Secondary | ICD-10-CM

## 2017-03-03 DIAGNOSIS — D509 Iron deficiency anemia, unspecified: Secondary | ICD-10-CM

## 2017-03-03 LAB — CBC WITH DIFFERENTIAL/PLATELET
BASO%: 0.3 % (ref 0.0–2.0)
BASOS ABS: 0 10*3/uL (ref 0.0–0.1)
EOS%: 3.2 % (ref 0.0–7.0)
Eosinophils Absolute: 0.2 10*3/uL (ref 0.0–0.5)
HEMATOCRIT: 42.8 % (ref 38.4–49.9)
HGB: 13.8 g/dL (ref 13.0–17.1)
LYMPH%: 25.2 % (ref 14.0–49.0)
MCH: 29.7 pg (ref 27.2–33.4)
MCHC: 32.2 g/dL (ref 32.0–36.0)
MCV: 92.2 fL (ref 79.3–98.0)
MONO#: 0.8 10*3/uL (ref 0.1–0.9)
MONO%: 11.5 % (ref 0.0–14.0)
NEUT#: 4.2 10*3/uL (ref 1.5–6.5)
NEUT%: 59.8 % (ref 39.0–75.0)
PLATELETS: 192 10*3/uL (ref 140–400)
RBC: 4.64 10*6/uL (ref 4.20–5.82)
RDW: 15.1 % — ABNORMAL HIGH (ref 11.0–14.6)
WBC: 7.1 10*3/uL (ref 4.0–10.3)
lymph#: 1.8 10*3/uL (ref 0.9–3.3)

## 2017-03-03 LAB — COMPREHENSIVE METABOLIC PANEL
ALT: 11 U/L (ref 0–55)
ANION GAP: 10 meq/L (ref 3–11)
AST: 15 U/L (ref 5–34)
Albumin: 4.1 g/dL (ref 3.5–5.0)
Alkaline Phosphatase: 83 U/L (ref 40–150)
BUN: 14.3 mg/dL (ref 7.0–26.0)
CALCIUM: 9.4 mg/dL (ref 8.4–10.4)
CHLORIDE: 106 meq/L (ref 98–109)
CO2: 27 meq/L (ref 22–29)
CREATININE: 1 mg/dL (ref 0.7–1.3)
EGFR: 77 mL/min/{1.73_m2} — AB (ref >90)
Glucose: 85 mg/dl (ref 70–140)
POTASSIUM: 4.1 meq/L (ref 3.5–5.1)
Sodium: 144 mEq/L (ref 136–145)
Total Bilirubin: 0.4 mg/dL (ref 0.20–1.20)
Total Protein: 6.8 g/dL (ref 6.4–8.3)

## 2017-03-03 LAB — IRON AND TIBC
%SAT: 10 % — AB (ref 20–55)
IRON: 33 ug/dL — AB (ref 42–163)
TIBC: 336 ug/dL (ref 202–409)
UIBC: 303 ug/dL (ref 117–376)

## 2017-03-03 LAB — FERRITIN: FERRITIN: 44 ng/mL (ref 22–316)

## 2017-03-03 MED ORDER — CYANOCOBALAMIN 1000 MCG/ML IJ SOLN
1000.0000 ug | Freq: Once | INTRAMUSCULAR | Status: AC
  Administered 2017-03-03: 1000 ug via INTRAMUSCULAR

## 2017-03-03 NOTE — Patient Instructions (Signed)

## 2017-03-04 LAB — VITAMIN B12: Vitamin B12: 380 pg/mL (ref 232–1245)

## 2017-03-31 ENCOUNTER — Other Ambulatory Visit (HOSPITAL_BASED_OUTPATIENT_CLINIC_OR_DEPARTMENT_OTHER): Payer: Medicare HMO

## 2017-03-31 ENCOUNTER — Ambulatory Visit (HOSPITAL_BASED_OUTPATIENT_CLINIC_OR_DEPARTMENT_OTHER): Payer: Medicare HMO

## 2017-03-31 VITALS — BP 147/60 | HR 62 | Temp 97.2°F | Resp 18

## 2017-03-31 DIAGNOSIS — D519 Vitamin B12 deficiency anemia, unspecified: Secondary | ICD-10-CM | POA: Diagnosis not present

## 2017-03-31 DIAGNOSIS — D509 Iron deficiency anemia, unspecified: Secondary | ICD-10-CM

## 2017-03-31 LAB — IRON AND TIBC
%SAT: 15 % — ABNORMAL LOW (ref 20–55)
IRON: 50 ug/dL (ref 42–163)
TIBC: 339 ug/dL (ref 202–409)
UIBC: 289 ug/dL (ref 117–376)

## 2017-03-31 LAB — CBC WITH DIFFERENTIAL/PLATELET
BASO%: 0.6 % (ref 0.0–2.0)
BASOS ABS: 0 10*3/uL (ref 0.0–0.1)
EOS ABS: 0.5 10*3/uL (ref 0.0–0.5)
EOS%: 5.7 % (ref 0.0–7.0)
HCT: 43.5 % (ref 38.4–49.9)
HGB: 14.6 g/dL (ref 13.0–17.1)
LYMPH%: 22.9 % (ref 14.0–49.0)
MCH: 30.4 pg (ref 27.2–33.4)
MCHC: 33.6 g/dL (ref 32.0–36.0)
MCV: 90.5 fL (ref 79.3–98.0)
MONO#: 0.7 10*3/uL (ref 0.1–0.9)
MONO%: 9.3 % (ref 0.0–14.0)
NEUT#: 4.9 10*3/uL (ref 1.5–6.5)
NEUT%: 61.5 % (ref 39.0–75.0)
PLATELETS: 197 10*3/uL (ref 140–400)
RBC: 4.81 10*6/uL (ref 4.20–5.82)
RDW: 15 % — ABNORMAL HIGH (ref 11.0–14.6)
WBC: 8 10*3/uL (ref 4.0–10.3)
lymph#: 1.8 10*3/uL (ref 0.9–3.3)

## 2017-03-31 LAB — COMPREHENSIVE METABOLIC PANEL
ALT: 11 U/L (ref 0–55)
ANION GAP: 9 meq/L (ref 3–11)
AST: 14 U/L (ref 5–34)
Albumin: 3.9 g/dL (ref 3.5–5.0)
Alkaline Phosphatase: 78 U/L (ref 40–150)
BILIRUBIN TOTAL: 0.48 mg/dL (ref 0.20–1.20)
BUN: 14.3 mg/dL (ref 7.0–26.0)
CALCIUM: 9.8 mg/dL (ref 8.4–10.4)
CHLORIDE: 106 meq/L (ref 98–109)
CO2: 28 meq/L (ref 22–29)
Creatinine: 1 mg/dL (ref 0.7–1.3)
EGFR: 77 mL/min/{1.73_m2} — AB (ref >90)
Glucose: 113 mg/dl (ref 70–140)
Potassium: 4.8 mEq/L (ref 3.5–5.1)
Sodium: 142 mEq/L (ref 136–145)
TOTAL PROTEIN: 6.8 g/dL (ref 6.4–8.3)

## 2017-03-31 LAB — FERRITIN: FERRITIN: 35 ng/mL (ref 22–316)

## 2017-03-31 MED ORDER — CYANOCOBALAMIN 1000 MCG/ML IJ SOLN
1000.0000 ug | Freq: Once | INTRAMUSCULAR | Status: AC
  Administered 2017-03-31: 1000 ug via INTRAMUSCULAR

## 2017-03-31 NOTE — Patient Instructions (Signed)

## 2017-04-02 ENCOUNTER — Telehealth: Payer: Self-pay | Admitting: Cardiology

## 2017-04-02 ENCOUNTER — Ambulatory Visit (INDEPENDENT_AMBULATORY_CARE_PROVIDER_SITE_OTHER): Payer: Medicare HMO | Admitting: *Deleted

## 2017-04-02 DIAGNOSIS — I255 Ischemic cardiomyopathy: Secondary | ICD-10-CM | POA: Diagnosis not present

## 2017-04-02 NOTE — Telephone Encounter (Signed)
LMOVM reminding pt to send remote transmission.   

## 2017-04-03 NOTE — Progress Notes (Signed)
Remote ICD transmission.   

## 2017-04-04 ENCOUNTER — Emergency Department (HOSPITAL_COMMUNITY)
Admission: EM | Admit: 2017-04-04 | Discharge: 2017-04-05 | Disposition: A | Discharge status: Home / Self Care | Payer: Medicare HMO | Attending: Emergency Medicine | Admitting: Emergency Medicine

## 2017-04-04 ENCOUNTER — Encounter (HOSPITAL_COMMUNITY): Payer: Self-pay | Admitting: Emergency Medicine

## 2017-04-04 DIAGNOSIS — Z951 Presence of aortocoronary bypass graft: Secondary | ICD-10-CM | POA: Insufficient documentation

## 2017-04-04 DIAGNOSIS — R21 Rash and other nonspecific skin eruption: Secondary | ICD-10-CM | POA: Diagnosis present

## 2017-04-04 DIAGNOSIS — I2510 Atherosclerotic heart disease of native coronary artery without angina pectoris: Secondary | ICD-10-CM | POA: Diagnosis not present

## 2017-04-04 DIAGNOSIS — Z7982 Long term (current) use of aspirin: Secondary | ICD-10-CM | POA: Diagnosis not present

## 2017-04-04 DIAGNOSIS — Z79899 Other long term (current) drug therapy: Secondary | ICD-10-CM | POA: Diagnosis not present

## 2017-04-04 DIAGNOSIS — Z96653 Presence of artificial knee joint, bilateral: Secondary | ICD-10-CM | POA: Insufficient documentation

## 2017-04-04 DIAGNOSIS — E119 Type 2 diabetes mellitus without complications: Secondary | ICD-10-CM | POA: Diagnosis not present

## 2017-04-04 DIAGNOSIS — W57XXXA Bitten or stung by nonvenomous insect and other nonvenomous arthropods, initial encounter: Secondary | ICD-10-CM | POA: Diagnosis not present

## 2017-04-04 DIAGNOSIS — I110 Hypertensive heart disease with heart failure: Secondary | ICD-10-CM | POA: Insufficient documentation

## 2017-04-04 DIAGNOSIS — Z7902 Long term (current) use of antithrombotics/antiplatelets: Secondary | ICD-10-CM | POA: Insufficient documentation

## 2017-04-04 DIAGNOSIS — Z7984 Long term (current) use of oral hypoglycemic drugs: Secondary | ICD-10-CM | POA: Diagnosis not present

## 2017-04-04 DIAGNOSIS — Z87891 Personal history of nicotine dependence: Secondary | ICD-10-CM | POA: Insufficient documentation

## 2017-04-04 DIAGNOSIS — I5042 Chronic combined systolic (congestive) and diastolic (congestive) heart failure: Secondary | ICD-10-CM | POA: Diagnosis not present

## 2017-04-04 NOTE — ED Provider Notes (Signed)
[facility]-EMERGENCY DEPT Provider Note   CSN: [id number] Arrival date & time: 04/04/17  2254     History   Chief Complaint Chief Complaint  Patient presents with  . Tick Removal     3 days ago    HPI [name] is a 71 y.o. male.  The history is provided by the patient.  Patient presents with tick bite.  He reports he had a tick 3 days ago on his right shoulder that he removed, but just wanted to get checked out.  He reports he has small scab in that area No other rash No fever/HA No other new complaints Previous h/o stroke with right sided paresis, no new changes  Past Medical History:  Diagnosis Date  . Anemia    Multifactorial  - hx of B12 def and iron deficiency, followed by heme, [facility]  . Automatic implantable cardioverter-defibrillator in situ   . CAD (coronary artery disease)    a. STEMI s/p CABG 07/2007 (had preop cardiogenic shock, IABP, VDRF before surgery)  . Cardiomyopathy, ischemic    a. Chronic systolic CHF s/p St. Jude dual chamber ICD 03/2008.  . Carotid stenosis    a. Prior carotid dz/ surgery. b. Carotid dopp 07/2011 - 0-39% bilaterally.;   c. Doppler 11/13: 40-59% RICA, 0-39% LICA  . Chronic renal insufficiency   . Diabetes mellitus   . GI bleed    Reported history  . Hx of CABG   . Hypertension    [place] [facility]   . Mitral regurgitation   . Myocardial infarction (HCC)   . Neuromuscular disorder (HCC)    R sided weakness   . Osteoarthritis   . PAF (paroxysmal atrial fibrillation) (HCC)    a. Poor Coumadin candidate due to hx of GI bleed.  . Sinus bradycardia   . Sleep apnea    study done, 10 yrs ago, unable to tolerate CPAP  . Splenomegaly   . Stroke [facility])    a. CVA 1990s - chronic pain in RUE after stroke.  [name] Systolic CHF, chronic (HCC)    Previously taken off lisinopril by primary doctor due to labs  . Tobacco abuse     Patient Active Problem List   Diagnosis Date Noted  . Dry eye 02/20/2016  . CHF  (congestive heart failure) (HCC)   . Elevated LFTs 02/13/2016  . Hypokalemia 02/13/2016  . Chronic combined systolic and diastolic CHF (congestive heart failure) (HCC) 02/13/2016  . Prolonged Q-T interval on ECG 02/13/2016  . Biliary colic 02/13/2016  . Left ear pain 02/13/2016  . Thrombocytopenia (HCC) 02/13/2016  . Right knee pain 10/13/2013  . Abnormality of gait 10/13/2013  . Stiffness of right knee 10/13/2013  . Arthritis of knee, right 08/16/2013  . Cholecystitis with cholelithiasis 03/07/2013  . Automatic implantable cardioverter-defibrillator in situ 03/13/2009  . DM (diabetes mellitus), secondary, with peripheral vascular complications (HCC) 03/12/2009  . Iron deficiency anemia 03/12/2009  . Essential hypertension 03/12/2009  . CARDIOMYOPATHY, ISCHEMIC 03/12/2009  . CEREBROVASCULAR DISEASE 03/12/2009  . PVD 03/12/2009  . RENAL INSUFFICIENCY, CHRONIC 03/12/2009  . OSTEOARTHRITIS 03/12/2009  . CORONARY ARTERY BYPASS GRAFT, HX OF 03/12/2009    Past Surgical History:  Procedure Laterality Date  . CARDIAC DEFIBRILLATOR PLACEMENT  04/05/2008   Implantation of a St. Jude dual-chamber defibrillator, Doylene Canning. Ladona Ridgel , MD  . CARDIAC DEFIBRILLATOR PLACEMENT    . CHOLECYSTECTOMY N/A 02/19/2016   Procedure: LAPAROSCOPIC CHOLECYSTECTOMY;  Surgeon: Abigail Miyamoto, MD;  Location: [facility]  OR;  Service: General;  Laterality: N/A;  . COLONOSCOPY  07/2007   Rourk: left-sided diverticulum. TI normal. No etiology for IDA.  [name] CORONARY ARTERY BYPASS GRAFT    . CORONARY ARTERY BYPASS GRAFT  08/29/2007   x3; Marilu Favre H. Cornelius Moras MD  . EP IMPLANTABLE DEVICE N/A 10/05/2015   Procedure: ICD Generator Changeout;  Surgeon: Marinus Maw, MD;  Location: [facility] INVASIVE CV LAB;  Service: Cardiovascular;  Laterality: N/A;  . ERCP N/A 02/18/2016   Procedure: ENDOSCOPIC RETROGRADE CHOLANGIOPANCREATOGRAPHY (ERCP);  Surgeon: Vida Rigger, MD;  Location: [facility] ENDOSCOPY;  Service: Endoscopy;  Laterality: N/A;  .  ESOPHAGOGASTRODUODENOSCOPY  07/2007   Rourk: small hh, atrophic gastric mucosa, poor distensibility of stomach ?extrinsic compression, CT showed mild to moderate splenomegaly. No etiology for IDA.  [name] JOINT REPLACEMENT  1990's   L knee  . Percutaneous coronary intervention  01/10/2005   Charlies Constable, MD  . TONSILLECTOMY    . TOTAL KNEE ARTHROPLASTY Right 08/16/2013   Procedure: RIGHT TOTAL KNEE ARTHROPLASTY;  Surgeon: Kathryne Hitch, MD;  Location: [facility] OR;  Service: Orthopedics;  Laterality: Right;       Home Medications    Prior to Admission medications   Medication Sig Start Date End Date Taking? Authorizing Provider  aspirin EC 81 MG EC tablet Take 1 tablet (81 mg total) by mouth daily. 07/29/12   Arguello, Roger A, PA-C  atorvastatin (LIPITOR) 80 MG tablet Take 0.5 tablets (40 mg total) by mouth every other day. 07/20/14   Marinus Maw, MD  baclofen (LIORESAL) 20 MG tablet Take 20 mg by mouth 2 (two) times daily.     [provider]  calcium carbonate (TUMS - DOSED IN MG ELEMENTAL CALCIUM) 500 MG chewable tablet Chew 1 tablet by mouth 3 (three) times daily as needed for indigestion or heartburn.  02/14/14   [provider]  Canagliflozin (INVOKANA) 300 MG TABS Take 1 tablet by mouth daily.    [provider]  carvedilol (COREG) 12.5 MG tablet Take 1 tablet (12.5 mg total) by mouth 2 (two) times daily with a meal. 11/03/14   Laqueta Linden, MD  clopidogrel (PLAVIX) 75 MG tablet Take 1 tablet (75 mg total) by mouth daily. 01/21/17   Marinus Maw, MD  furosemide (LASIX) 40 MG tablet Take 40 mg by mouth daily as needed for fluid.     [provider]  glimepiride (AMARYL) 4 MG tablet Take 4 mg by mouth 2 (two) times daily.     [provider]  linagliptin (TRADJENTA) 5 MG TABS tablet Take 5 mg by mouth daily after breakfast.     [provider]  meclizine (ANTIVERT) 25 MG tablet Take 25 mg by mouth 4 (four) times daily as  needed. For dizziness 02/26/12   [provider]  metoCLOPramide (REGLAN) 10 MG tablet Take 1 tablet (10 mg total) by mouth 3 (three) times daily before meals. 02/22/16   Rolly Salter, MD  nitroGLYCERIN (NITROSTAT) 0.4 MG SL tablet Place 1 tablet (0.4 mg total) under the tongue every 5 (five) minutes as needed for chest pain. 01/02/16   Marinus Maw, MD  oxyCODONE-acetaminophen (PERCOCET) 10-325 MG per tablet Take 1 tablet by mouth every 4 (four) hours as needed for pain.  01/26/14   [provider]  polyethylene glycol (MIRALAX / GLYCOLAX) packet Take 17 g by mouth daily. 02/22/16   Rolly Salter, MD  RANITIDINE HCL PO Take 1 tablet by mouth daily as  needed (heartburn or indigestion).  12/17/13   [provider]  SENNA CO Take 1 tablet by mouth daily as needed (constipation).  01/10/14   [provider]  spironolactone (ALDACTONE) 25 MG tablet Take 1 tablet (25 mg total) by mouth every morning. 07/20/14   Marinus Maw, MD    Family History Family History  Problem Relation Age of Onset  . Heart attack Mother        CVA, MI  . Diabetes Mother   . Heart attack Father        MI  . Diabetes Sister   . Coronary artery disease Brother        CABG    Social History Social History  Substance Use Topics  . Smoking status: Former Smoker    Packs/day: 1.00    Years: 15.00    Quit date: 09/29/1990  . Smokeless tobacco: Former Neurosurgeon    Quit date: 08/09/1988  . Alcohol use No     Allergies   Codeine   Review of Systems Review of Systems  Constitutional: Negative for fever.  Eyes: Negative for visual disturbance.  Respiratory: Negative for shortness of breath.   Cardiovascular: Negative for chest pain.  Gastrointestinal: Negative for abdominal pain.  Skin: Negative for rash.  Neurological: Negative for headaches.  All other systems reviewed and are negative.    Physical Exam Updated Vital Signs BP (!) 159/71 (BP Location: Right Arm)   Pulse  (!) 59   Temp 97.7 F (36.5 C)   Resp 20   Ht 1.88 m (6\' 2" )   Wt 97.5 kg (215 lb)   SpO2 100%   BMI 27.60 kg/m   Physical Exam CONSTITUTIONAL: Well developed/well nourished HEAD: Normocephalic/atraumatic EYES: EOMI/PERRL ENMT: Mucous membranes moist NECK: supple no meningeal signs SPINE/BACK:entire spine nontender CV: S1/S2 noted, no murmurs/rubs/gallops noted LUNGS: Lungs are clear to auscultation bilaterally, no apparent distress ABDOMEN: soft, nontender NEURO: Pt is awake/alert/appropriate EXTREMITIES: pulses normal/equal, full ROM SKIN: warm, color normal, small well healing scab to right shoulder.  No erythema/drainage.  No other concerning rash noted. No petechiae PSYCH: no abnormalities of mood noted, alert and oriented to situation   ED Treatments / Results  Labs (all labs ordered are listed, but only abnormal results are displayed) Labs Reviewed - No data to display  EKG  EKG Interpretation None       Radiology No results found.  Procedures Procedures (including critical care time)  Medications Ordered in ED Medications - No data to display   Initial Impression / Assessment and Plan / ED Course  I have reviewed the triage vital signs and the nursing notes.      Pt stable He removed a tick 3 days ago He has no other symptoms Will defer any antibiotics Advised to call PCP if any new rash/HA/fever occur  Final Clinical Impressions(s) / ED Diagnoses   Final diagnoses:  Tick bite, initial encounter    New Prescriptions New Prescriptions   No medications on file     Zadie Rhine, MD 04/05/17 0000

## 2017-04-04 NOTE — ED Triage Notes (Signed)
Tick bite three days ago- pulled it off of his posterior Left shoulder area Reports "bothering me"

## 2017-04-10 ENCOUNTER — Encounter: Payer: Self-pay | Admitting: Cardiology

## 2017-04-28 LAB — CUP PACEART REMOTE DEVICE CHECK
Battery Remaining Longevity: 70 mo
Battery Remaining Percentage: 82 %
Battery Voltage: 2.99 V
Brady Statistic AP VP Percent: 1.1 %
Brady Statistic AS VS Percent: 24 %
Brady Statistic RA Percent Paced: 76 %
Brady Statistic RV Percent Paced: 1.2 %
HIGH POWER IMPEDANCE MEASURED VALUE: 68 Ohm
HighPow Impedance: 68 Ohm
Implantable Lead Implant Date: 20090708
Implantable Lead Location: 753859
Implantable Lead Model: 7122
Implantable Pulse Generator Implant Date: 20170106
Lead Channel Impedance Value: 340 Ohm
Lead Channel Pacing Threshold Amplitude: 0.5 V
Lead Channel Pacing Threshold Amplitude: 1 V
Lead Channel Pacing Threshold Pulse Width: 0.9 ms
Lead Channel Sensing Intrinsic Amplitude: 12 mV
Lead Channel Setting Pacing Amplitude: 2.5 V
Lead Channel Setting Pacing Pulse Width: 0.9 ms
Lead Channel Setting Sensing Sensitivity: 0.5 mV
MDC IDC LEAD IMPLANT DT: 20090708
MDC IDC LEAD LOCATION: 753860
MDC IDC MSMT LEADCHNL RA IMPEDANCE VALUE: 350 Ohm
MDC IDC MSMT LEADCHNL RA PACING THRESHOLD PULSEWIDTH: 0.5 ms
MDC IDC MSMT LEADCHNL RA SENSING INTR AMPL: 2 mV
MDC IDC PG SERIAL: 7306179
MDC IDC SESS DTM: 20180705210305
MDC IDC SET LEADCHNL RA PACING AMPLITUDE: 2 V
MDC IDC STAT BRADY AP VS PERCENT: 75 %
MDC IDC STAT BRADY AS VP PERCENT: 1 %

## 2017-05-04 ENCOUNTER — Other Ambulatory Visit: Payer: Self-pay | Admitting: *Deleted

## 2017-05-04 DIAGNOSIS — D509 Iron deficiency anemia, unspecified: Secondary | ICD-10-CM

## 2017-05-05 ENCOUNTER — Ambulatory Visit (HOSPITAL_BASED_OUTPATIENT_CLINIC_OR_DEPARTMENT_OTHER): Payer: Medicare HMO

## 2017-05-05 ENCOUNTER — Other Ambulatory Visit (HOSPITAL_BASED_OUTPATIENT_CLINIC_OR_DEPARTMENT_OTHER): Payer: Medicare HMO

## 2017-05-05 VITALS — BP 133/66 | HR 64 | Temp 97.5°F | Resp 20

## 2017-05-05 DIAGNOSIS — D519 Vitamin B12 deficiency anemia, unspecified: Secondary | ICD-10-CM

## 2017-05-05 DIAGNOSIS — D509 Iron deficiency anemia, unspecified: Secondary | ICD-10-CM

## 2017-05-05 LAB — CBC WITH DIFFERENTIAL/PLATELET
BASO%: 0.4 % (ref 0.0–2.0)
BASOS ABS: 0 10*3/uL (ref 0.0–0.1)
EOS ABS: 0.4 10*3/uL (ref 0.0–0.5)
EOS%: 5 % (ref 0.0–7.0)
HCT: 43 % (ref 38.4–49.9)
HEMOGLOBIN: 14.2 g/dL (ref 13.0–17.1)
LYMPH%: 20.3 % (ref 14.0–49.0)
MCH: 29.7 pg (ref 27.2–33.4)
MCHC: 32.9 g/dL (ref 32.0–36.0)
MCV: 90.2 fL (ref 79.3–98.0)
MONO#: 1 10*3/uL — ABNORMAL HIGH (ref 0.1–0.9)
MONO%: 12.5 % (ref 0.0–14.0)
NEUT#: 4.9 10*3/uL (ref 1.5–6.5)
NEUT%: 61.8 % (ref 39.0–75.0)
Platelets: 189 10*3/uL (ref 140–400)
RBC: 4.76 10*6/uL (ref 4.20–5.82)
RDW: 14.7 % — ABNORMAL HIGH (ref 11.0–14.6)
WBC: 8 10*3/uL (ref 4.0–10.3)
lymph#: 1.6 10*3/uL (ref 0.9–3.3)

## 2017-05-05 MED ORDER — CYANOCOBALAMIN 1000 MCG/ML IJ SOLN
1000.0000 ug | Freq: Once | INTRAMUSCULAR | Status: AC
  Administered 2017-05-05: 1000 ug via INTRAMUSCULAR

## 2017-05-05 NOTE — Patient Instructions (Signed)

## 2017-05-16 ENCOUNTER — Emergency Department (HOSPITAL_COMMUNITY): Payer: Medicare HMO

## 2017-05-16 ENCOUNTER — Emergency Department (HOSPITAL_COMMUNITY)
Admission: EM | Admit: 2017-05-16 | Discharge: 2017-05-16 | Disposition: A | Discharge status: Home Health | Payer: Medicare HMO | Attending: Emergency Medicine | Admitting: Emergency Medicine

## 2017-05-16 ENCOUNTER — Encounter (HOSPITAL_COMMUNITY): Payer: Self-pay | Admitting: *Deleted

## 2017-05-16 DIAGNOSIS — Z87891 Personal history of nicotine dependence: Secondary | ICD-10-CM | POA: Insufficient documentation

## 2017-05-16 DIAGNOSIS — I252 Old myocardial infarction: Secondary | ICD-10-CM | POA: Insufficient documentation

## 2017-05-16 DIAGNOSIS — E119 Type 2 diabetes mellitus without complications: Secondary | ICD-10-CM | POA: Diagnosis not present

## 2017-05-16 DIAGNOSIS — I480 Paroxysmal atrial fibrillation: Secondary | ICD-10-CM | POA: Diagnosis not present

## 2017-05-16 DIAGNOSIS — Z7982 Long term (current) use of aspirin: Secondary | ICD-10-CM | POA: Diagnosis not present

## 2017-05-16 DIAGNOSIS — Z951 Presence of aortocoronary bypass graft: Secondary | ICD-10-CM | POA: Insufficient documentation

## 2017-05-16 DIAGNOSIS — Z8673 Personal history of transient ischemic attack (TIA), and cerebral infarction without residual deficits: Secondary | ICD-10-CM | POA: Insufficient documentation

## 2017-05-16 DIAGNOSIS — Z95810 Presence of automatic (implantable) cardiac defibrillator: Secondary | ICD-10-CM | POA: Insufficient documentation

## 2017-05-16 DIAGNOSIS — I5022 Chronic systolic (congestive) heart failure: Secondary | ICD-10-CM | POA: Diagnosis not present

## 2017-05-16 DIAGNOSIS — I2510 Atherosclerotic heart disease of native coronary artery without angina pectoris: Secondary | ICD-10-CM | POA: Diagnosis not present

## 2017-05-16 DIAGNOSIS — I110 Hypertensive heart disease with heart failure: Secondary | ICD-10-CM | POA: Diagnosis not present

## 2017-05-16 DIAGNOSIS — M25561 Pain in right knee: Secondary | ICD-10-CM | POA: Insufficient documentation

## 2017-05-16 NOTE — Discharge Instructions (Signed)
Wear the knee sleeve as needed.  Apply ice packs on/off to your knee.  Tylenol if needed. Follow-up with your primary doctor for recheck if needed.

## 2017-05-16 NOTE — ED Triage Notes (Signed)
Pt reports that he fell at home and injured his R knee  Dr Charm Barges is his PCP

## 2017-05-16 NOTE — ED Triage Notes (Signed)
Pt c/o right knee pain after fall today. Right knee is swollen and bruised. Pt also has bruising to right elbow from fall. Pt denies LOC upon fall.

## 2017-05-18 NOTE — ED Provider Notes (Signed)
[facility]-EMERGENCY DEPT Provider Note   CSN: [id number] Arrival date & time: 05/16/17  1625     History   Chief Complaint Chief Complaint  Patient presents with  . Knee Injury    HPI [name] is a 71 y.o. male.  HPI  [name] is a 71 y.o. male with hx of total right knee replacement 4 years ago, who presents to the Emergency Department complaining of right knee pain for several hours secondary to a mechanical fall.  He states that he lost his footing while bending over and fell onto the right knee.  He describes immediate swelling to the knee and pain with bending.  Injury occurred several hours prior to arrival.  Since that time, he states the pain and swelling have improved.  He denies symptoms prior to the fall, numbness or weakness of the lower extremity, and other injuries.      Past Medical History:  Diagnosis Date  . Anemia    Multifactorial  - hx of B12 def and iron deficiency, followed by heme[name]  . Automatic implantable cardioverter-defibrillator in situ   . CAD (coronary artery disease)    a. STEMI s/p CABG 07/2007 (had preop cardiogenic shock, IABP, VDRF before surgery)  . Cardiomyopathy, ischemic    a. Chronic systolic CHF s/p St. Jude dual chamber ICD 03/2008.  . Carotid stenosis    a. Prior carotid dz/ surgery. b. Carotid dopp 07/2011 - 0-39% bilaterally.;   c. Doppler 11/13: 40-59% RICA, 0-39% LICA  . Chronic renal insufficiency   . Diabetes mellitus   . GI bleed    Reported history  . Hx of CABG   . Hypertension    [place] cardiac care   . Mitral regurgitation   . Myocardial infarction (HCC)   . Neuromuscular disorder (HCC)    R sided weakness   . Osteoarthritis   . PAF (paroxysmal atrial fibrillation) (HCC)    a. Poor Coumadin candidate due to hx of GI bleed.  . Sinus bradycardia   . Sleep apnea    study done, 10 yrs ago, unable to tolerate CPAP  . Splenomegaly   . Stroke [facility])    a. CVA 1990s - chronic  pain in RUE after stroke.  [name] Systolic CHF, chronic (HCC)    Previously taken off lisinopril by primary doctor due to labs  . Tobacco abuse     Patient Active Problem List   Diagnosis Date Noted  . Dry eye 02/20/2016  . CHF (congestive heart failure) (HCC)   . Elevated LFTs 02/13/2016  . Hypokalemia 02/13/2016  . Chronic combined systolic and diastolic CHF (congestive heart failure) (HCC) 02/13/2016  . Prolonged Q-T interval on ECG 02/13/2016  . Biliary colic 02/13/2016  . Left ear pain 02/13/2016  . Thrombocytopenia (HCC) 02/13/2016  . Right knee pain 10/13/2013  . Abnormality of gait 10/13/2013  . Stiffness of right knee 10/13/2013  . Arthritis of knee, right 08/16/2013  . Cholecystitis with cholelithiasis 03/07/2013  . Automatic implantable cardioverter-defibrillator in situ 03/13/2009  . DM (diabetes mellitus), secondary, with peripheral vascular complications (HCC) 03/12/2009  . Iron deficiency anemia 03/12/2009  . Essential hypertension 03/12/2009  . CARDIOMYOPATHY, ISCHEMIC 03/12/2009  . CEREBROVASCULAR DISEASE 03/12/2009  . PVD 03/12/2009  . RENAL INSUFFICIENCY, CHRONIC 03/12/2009  . OSTEOARTHRITIS 03/12/2009  . CORONARY ARTERY BYPASS GRAFT, HX OF 03/12/2009    Past Surgical History:  Procedure Laterality Date  . CARDIAC DEFIBRILLATOR PLACEMENT  04/05/2008  Implantation of a St. Jude dual-chamber defibrillator, [name]. [name] , MD  . CARDIAC DEFIBRILLATOR PLACEMENT    . CHOLECYSTECTOMY N/A 02/19/2016   Procedure: LAPAROSCOPIC CHOLECYSTECTOMY;  Surgeon: Abigail Miyamoto, MD;  Location: [name] OR;  Service: General;  Laterality: N/A;  . COLONOSCOPY  07/2007   Rourk: left-sided diverticulum. TI normal. No etiology for IDA.  [name] CORONARY ARTERY BYPASS GRAFT    . CORONARY ARTERY BYPASS GRAFT  08/29/2007   x3; [name] Favre H. Cornelius Moras MD  . EP IMPLANTABLE DEVICE N/A 10/05/2015   Procedure: ICD Generator Changeout;  Surgeon: Marinus Maw, MD;  Location: [name] INVASIVE CV LAB;  Service:  Cardiovascular;  Laterality: N/A;  . ERCP N/A 02/18/2016   Procedure: ENDOSCOPIC RETROGRADE CHOLANGIOPANCREATOGRAPHY (ERCP);  Surgeon: Vida Rigger, MD;  Location: [name] ENDOSCOPY;  Service: Endoscopy;  Laterality: N/A;  . ESOPHAGOGASTRODUODENOSCOPY  07/2007   Rourk: small hh, atrophic gastric mucosa, poor distensibility of stomach ?extrinsic compression, CT showed mild to moderate splenomegaly. No etiology for IDA.  [name] JOINT REPLACEMENT  1990's   L knee  . Percutaneous coronary intervention  01/10/2005   [name] Constable, MD  . TONSILLECTOMY    . TOTAL KNEE ARTHROPLASTY Right 08/16/2013   Procedure: RIGHT TOTAL KNEE ARTHROPLASTY;  Surgeon: [name] Hitch, MD;  Location: [facility] OR;  Service: Orthopedics;  Laterality: Right;       Home Medications    Prior to Admission medications   Medication Sig Start Date End Date Taking? Authorizing Provider  aspirin EC 81 MG EC tablet Take 1 tablet (81 mg total) by mouth daily. 07/29/12   Arguello, Roger A, PA-C  atorvastatin (LIPITOR) 80 MG tablet Take 0.5 tablets (40 mg total) by mouth every other day. 07/20/14   Marinus Maw, MD  baclofen (LIORESAL) 20 MG tablet Take 20 mg by mouth 2 (two) times daily.     [provider]  calcium carbonate (TUMS - DOSED IN MG ELEMENTAL CALCIUM) 500 MG chewable tablet Chew 1 tablet by mouth 3 (three) times daily as needed for indigestion or heartburn.  02/14/14   [provider]  Canagliflozin (INVOKANA) 300 MG TABS Take 1 tablet by mouth daily.    [provider]  carvedilol (COREG) 12.5 MG tablet Take 1 tablet (12.5 mg total) by mouth 2 (two) times daily with a meal. 11/03/14   Laqueta Linden, MD  clopidogrel (PLAVIX) 75 MG tablet Take 1 tablet (75 mg total) by mouth daily. 01/21/17   Marinus Maw, MD  furosemide (LASIX) 40 MG tablet Take 40 mg by mouth daily as needed for fluid.     [provider]  glimepiride (AMARYL) 4 MG tablet Take 4 mg by mouth 2 (two) times daily.      [provider]  linagliptin (TRADJENTA) 5 MG TABS tablet Take 5 mg by mouth daily after breakfast.     [provider]  meclizine (ANTIVERT) 25 MG tablet Take 25 mg by mouth 4 (four) times daily as needed. For dizziness 02/26/12   [provider]  metoCLOPramide (REGLAN) 10 MG tablet Take 1 tablet (10 mg total) by mouth 3 (three) times daily before meals. 02/22/16   Rolly Salter, MD  nitroGLYCERIN (NITROSTAT) 0.4 MG SL tablet Place 1 tablet (0.4 mg total) under the tongue every 5 (five) minutes as needed for chest pain. 01/02/16   Marinus Maw, MD  oxyCODONE-acetaminophen (PERCOCET) 10-325 MG per tablet Take 1 tablet by mouth every 4 (four) hours as needed for pain.  01/26/14   [provider]  polyethylene glycol (MIRALAX / GLYCOLAX) packet Take 17 g by mouth daily. 02/22/16   Rolly Salter, MD  RANITIDINE HCL PO Take 1 tablet by mouth daily as needed (heartburn or indigestion).  12/17/13   [provider]  SENNA CO Take 1 tablet by mouth daily as needed (constipation).  01/10/14   [provider]  spironolactone (ALDACTONE) 25 MG tablet Take 1 tablet (25 mg total) by mouth every morning. 07/20/14   Marinus Maw, MD    Family History Family History  Problem Relation Age of Onset  . Heart attack Mother        CVA, MI  . Diabetes Mother   . Heart attack Father        MI  . Diabetes Sister   . Coronary artery disease Brother        CABG    Social History Social History  Substance Use Topics  . Smoking status: Former Smoker    Packs/day: 1.00    Years: 15.00    Quit date: 09/29/1990  . Smokeless tobacco: Former Neurosurgeon    Quit date: 08/09/1988  . Alcohol use No     Allergies   Codeine   Review of Systems Review of Systems  Constitutional: Negative for chills and fever.  Respiratory: Negative for shortness of breath.   Cardiovascular: Negative for chest pain.  Gastrointestinal: Negative for nausea and vomiting.    Musculoskeletal: Positive for arthralgias (right knee pain ) and joint swelling. Negative for back pain and neck pain.  Skin: Positive for color change (bruising to the right knee). Negative for wound.  Neurological: Negative for dizziness, syncope, facial asymmetry, weakness, numbness and headaches.  Psychiatric/Behavioral: Negative for confusion and decreased concentration.  All other systems reviewed and are negative.    Physical Exam Updated Vital Signs BP (!) 133/59   Pulse (!) 57   Temp 98.2 F (36.8 C) (Oral)   Resp 16   Ht 6\' 1"  (1.854 m)   Wt 97.5 kg (215 lb)   SpO2 98%   BMI 28.37 kg/m   Physical Exam  Constitutional: He is oriented to person, place, and time. He appears well-developed and well-nourished. No distress.  HENT:  Head: Atraumatic.  Eyes: Pupils are equal, round, and reactive to light. EOM are normal.  Neck: Normal range of motion. Neck supple.  Cardiovascular: Normal rate, regular rhythm and intact distal pulses.   Pulmonary/Chest: Effort normal and breath sounds normal. No respiratory distress. He exhibits no tenderness.  Musculoskeletal: He exhibits edema and tenderness. He exhibits no deformity.  ttp of the anterior right knee. Pt has full ROM of the joint.  Mild to moderate edema.  No effusion. Compartments are soft.   Neurological: He is alert and oriented to person, place, and time. No sensory deficit.  Skin: Skin is warm. Capillary refill takes less than 2 seconds.  Well healed vertical incision tot he anterior right knee.  Small superficial abrasion and mild ecchymosis.    Nursing note and vitals reviewed.    ED Treatments / Results  Labs (all labs ordered are listed, but only abnormal results are displayed) Labs Reviewed - No data to display  EKG  EKG Interpretation None       Radiology Dg Knee Complete 4 Views Right  Result Date: 05/16/2017 CLINICAL DATA:  Recent fall with right knee pain, initial encounter EXAM: RIGHT KNEE -  COMPLETE 4+ VIEW COMPARISON:  08/16/2013 FINDINGS: Right knee replacement  is again identified and stable. No acute fracture or dislocation is noted. No joint effusion is seen. No changes of loosening are noted. IMPRESSION: Postsurgical changes without acute abnormality. Electronically Signed   By: Alcide Clever M.D.   On: 05/16/2017 17:48    Procedures Procedures (including critical care time)  Medications Ordered in ED Medications - No data to display   Initial Impression / Assessment and Plan / ED Course  I have reviewed the triage vital signs and the nursing notes.  Pertinent labs & imaging results that were available during my care of the patient were reviewed by me and considered in my medical decision making (see chart for details).     XR neg for fx or dislocation.  NV intact.  Pt has been ambulating in the dept, with steady gait. Came to the nursing desk and requesting discharge.   Knee sleeve applied.  Pt agrees to RICE therapy, tylenol if needed for pain and orthopedic f/u if needed.      Final Clinical Impressions(s) / ED Diagnoses   Final diagnoses:  Acute pain of right knee    New Prescriptions Discharge Medication List as of 05/16/2017  6:28 PM       Pauline Aus, PA-C 05/18/17 1317    Eber Hong, MD 05/18/17 [id number]

## 2017-06-02 ENCOUNTER — Other Ambulatory Visit (HOSPITAL_BASED_OUTPATIENT_CLINIC_OR_DEPARTMENT_OTHER): Payer: Medicare HMO

## 2017-06-02 ENCOUNTER — Ambulatory Visit (HOSPITAL_BASED_OUTPATIENT_CLINIC_OR_DEPARTMENT_OTHER): Payer: Medicare HMO

## 2017-06-02 VITALS — BP 129/61 | HR 77 | Temp 97.8°F | Resp 18

## 2017-06-02 DIAGNOSIS — E538 Deficiency of other specified B group vitamins: Secondary | ICD-10-CM

## 2017-06-02 DIAGNOSIS — D509 Iron deficiency anemia, unspecified: Secondary | ICD-10-CM

## 2017-06-02 DIAGNOSIS — D519 Vitamin B12 deficiency anemia, unspecified: Secondary | ICD-10-CM | POA: Diagnosis not present

## 2017-06-02 LAB — COMPREHENSIVE METABOLIC PANEL
ALBUMIN: 3.6 g/dL (ref 3.5–5.0)
ALK PHOS: 81 U/L (ref 40–150)
ALT: 9 U/L (ref 0–55)
AST: 13 U/L (ref 5–34)
Anion Gap: 7 mEq/L (ref 3–11)
BUN: 15.7 mg/dL (ref 7.0–26.0)
CALCIUM: 9.4 mg/dL (ref 8.4–10.4)
CO2: 28 mEq/L (ref 22–29)
CREATININE: 0.9 mg/dL (ref 0.7–1.3)
Chloride: 105 mEq/L (ref 98–109)
EGFR: 87 mL/min/{1.73_m2} — ABNORMAL LOW (ref >90)
GLUCOSE: 104 mg/dL (ref 70–140)
Potassium: 4 mEq/L (ref 3.5–5.1)
Sodium: 141 mEq/L (ref 136–145)
TOTAL PROTEIN: 6.7 g/dL (ref 6.4–8.3)
Total Bilirubin: 0.46 mg/dL (ref 0.20–1.20)

## 2017-06-02 LAB — CBC WITH DIFFERENTIAL/PLATELET
BASO%: 0.5 % (ref 0.0–2.0)
Basophils Absolute: 0 10*3/uL (ref 0.0–0.1)
EOS ABS: 0.3 10*3/uL (ref 0.0–0.5)
EOS%: 5.4 % (ref 0.0–7.0)
HEMATOCRIT: 39.6 % (ref 38.4–49.9)
HEMOGLOBIN: 13.1 g/dL (ref 13.0–17.1)
LYMPH%: 23.4 % (ref 14.0–49.0)
MCH: 29.8 pg (ref 27.2–33.4)
MCHC: 33.2 g/dL (ref 32.0–36.0)
MCV: 89.7 fL (ref 79.3–98.0)
MONO#: 0.6 10*3/uL (ref 0.1–0.9)
MONO%: 10.6 % (ref 0.0–14.0)
NEUT%: 60.1 % (ref 39.0–75.0)
NEUTROS ABS: 3.6 10*3/uL (ref 1.5–6.5)
Platelets: 196 10*3/uL (ref 140–400)
RBC: 4.41 10*6/uL (ref 4.20–5.82)
RDW: 14.7 % — ABNORMAL HIGH (ref 11.0–14.6)
WBC: 5.9 10*3/uL (ref 4.0–10.3)
lymph#: 1.4 10*3/uL (ref 0.9–3.3)

## 2017-06-02 LAB — IRON AND TIBC
%SAT: 12 % — ABNORMAL LOW (ref 20–55)
Iron: 41 ug/dL — ABNORMAL LOW (ref 42–163)
TIBC: 330 ug/dL (ref 202–409)
UIBC: 289 ug/dL (ref 117–376)

## 2017-06-02 LAB — FERRITIN: FERRITIN: 61 ng/mL (ref 22–316)

## 2017-06-02 MED ORDER — CYANOCOBALAMIN 1000 MCG/ML IJ SOLN
1000.0000 ug | Freq: Once | INTRAMUSCULAR | Status: AC
  Administered 2017-06-02: 1000 ug via INTRAMUSCULAR

## 2017-06-02 NOTE — Patient Instructions (Signed)

## 2017-06-03 LAB — VITAMIN B12: Vitamin B12: 437 pg/mL (ref 232–1245)

## 2017-06-30 ENCOUNTER — Ambulatory Visit (HOSPITAL_BASED_OUTPATIENT_CLINIC_OR_DEPARTMENT_OTHER): Payer: Medicare HMO | Admitting: Oncology

## 2017-06-30 ENCOUNTER — Other Ambulatory Visit (HOSPITAL_BASED_OUTPATIENT_CLINIC_OR_DEPARTMENT_OTHER): Payer: Medicare HMO

## 2017-06-30 ENCOUNTER — Ambulatory Visit (HOSPITAL_BASED_OUTPATIENT_CLINIC_OR_DEPARTMENT_OTHER): Payer: Medicare HMO

## 2017-06-30 ENCOUNTER — Telehealth: Payer: Self-pay | Admitting: Oncology

## 2017-06-30 VITALS — BP 141/68 | HR 76 | Resp 18 | Ht 73.0 in | Wt 217.7 lb

## 2017-06-30 DIAGNOSIS — E538 Deficiency of other specified B group vitamins: Secondary | ICD-10-CM

## 2017-06-30 DIAGNOSIS — D509 Iron deficiency anemia, unspecified: Secondary | ICD-10-CM | POA: Diagnosis not present

## 2017-06-30 DIAGNOSIS — D519 Vitamin B12 deficiency anemia, unspecified: Secondary | ICD-10-CM | POA: Diagnosis not present

## 2017-06-30 DIAGNOSIS — Z8673 Personal history of transient ischemic attack (TIA), and cerebral infarction without residual deficits: Secondary | ICD-10-CM

## 2017-06-30 LAB — CBC WITH DIFFERENTIAL/PLATELET
BASO%: 0.4 % (ref 0.0–2.0)
BASOS ABS: 0 10*3/uL (ref 0.0–0.1)
EOS ABS: 0.4 10*3/uL (ref 0.0–0.5)
EOS%: 5.7 % (ref 0.0–7.0)
HCT: 41.6 % (ref 38.4–49.9)
HEMOGLOBIN: 13.7 g/dL (ref 13.0–17.1)
LYMPH%: 25.4 % (ref 14.0–49.0)
MCH: 29.5 pg (ref 27.2–33.4)
MCHC: 32.9 g/dL (ref 32.0–36.0)
MCV: 89.6 fL (ref 79.3–98.0)
MONO#: 0.8 10*3/uL (ref 0.1–0.9)
MONO%: 10.1 % (ref 0.0–14.0)
NEUT#: 4.6 10*3/uL (ref 1.5–6.5)
NEUT%: 58.4 % (ref 39.0–75.0)
Platelets: 221 10*3/uL (ref 140–400)
RBC: 4.64 10*6/uL (ref 4.20–5.82)
RDW: 15.4 % — AB (ref 11.0–14.6)
WBC: 7.9 10*3/uL (ref 4.0–10.3)
lymph#: 2 10*3/uL (ref 0.9–3.3)

## 2017-06-30 MED ORDER — CYANOCOBALAMIN 1000 MCG/ML IJ SOLN
INTRAMUSCULAR | Status: AC
  Filled 2017-06-30: qty 1

## 2017-06-30 MED ORDER — CYANOCOBALAMIN 1000 MCG/ML IJ SOLN
1000.0000 ug | Freq: Once | INTRAMUSCULAR | Status: AC
  Administered 2017-06-30: 1000 ug via INTRAMUSCULAR

## 2017-06-30 NOTE — Progress Notes (Signed)
[name] HEMATOLOGY OFFICE PROGRESS NOTE 06/30/17   [name] Jester, DO [address] [place] [address]  DIAGNOSIS: 71 year old gentleman with multifactorial anemia. He has an element of iron deficiency, B12 and anemia of chronic disease. This was diagnosed in 2008.    CURRENT THERAPY:  Monthly Vitamin B-12 1000 mcg IM injection. These injections were restarted on 08/05/2012. Intermittent IV iron infusion utilizing Feraheme. He is scheduled to have one 01/26/2017.  INTERVAL HISTORY:  [name] presents today for a follow-up visit. Since the last visit, he reports changes in his health. He continues to live independently and attends activities of daily living. She also takes care of his wife who has multiple health issues. He continues to attend to activities of daily living including driving. He continues to receive vitamin B12 injections without complications. He reports his energy is much improved with these injections. He also received it IV iron intermittently which also helped his symptoms. He denies any hematochezia, melena or epistaxis. He denied any falls or syncope. He denied any hospitalizations or illnesses.   He does not report any headaches, blurry vision, syncope or seizures. He does not report any fevers, chills, sweats or weight loss. He is not report any chest pain, palpitation, orthopnea. He does not report any cough, wheezing or hemoptysis. He does not report any nausea, vomiting, abdominal pain. He does not report any musca skeletal complaints. He does not report any frequency urgency or hesitancy. Remainder review of systems unremarkable.   ALLERGIES:  is allergic to codeine.  MEDICATIONS:   Current Outpatient Prescriptions  Medication Sig Dispense Refill  . aspirin EC 81 MG EC tablet Take 1 tablet (81 mg total) by mouth daily.    [name] atorvastatin (LIPITOR) 80 MG tablet Take 0.5 tablets (40 mg total) by mouth every other day. 90 tablet 3  .  baclofen (LIORESAL) 20 MG tablet Take 20 mg by mouth 2 (two) times daily.     . calcium carbonate (TUMS - DOSED IN MG ELEMENTAL CALCIUM) 500 MG chewable tablet Chew 1 tablet by mouth 3 (three) times daily as needed for indigestion or heartburn.     . Canagliflozin (INVOKANA) 300 MG TABS Take 1 tablet by mouth daily.    . carvedilol (COREG) 12.5 MG tablet Take 1 tablet (12.5 mg total) by mouth 2 (two) times daily with a meal. 180 tablet 3  . clopidogrel (PLAVIX) 75 MG tablet Take 1 tablet (75 mg total) by mouth daily. 90 tablet 3  . furosemide (LASIX) 40 MG tablet Take 40 mg by mouth daily as needed for fluid.     [name] glimepiride (AMARYL) 4 MG tablet Take 4 mg by mouth 2 (two) times daily.     [name] linagliptin (TRADJENTA) 5 MG TABS tablet Take 5 mg by mouth daily after breakfast.     . meclizine (ANTIVERT) 25 MG tablet Take 25 mg by mouth 4 (four) times daily as needed. For dizziness    . metoCLOPramide (REGLAN) 10 MG tablet Take 1 tablet (10 mg total) by mouth 3 (three) times daily before meals. 15 tablet 0  . nitroGLYCERIN (NITROSTAT) 0.4 MG SL tablet Place 1 tablet (0.4 mg total) under the tongue every 5 (five) minutes as needed for chest pain. 25 tablet 4  . oxyCODONE-acetaminophen (PERCOCET) 10-325 MG per tablet Take 1 tablet by mouth every 4 (four) hours as needed for pain.     . polyethylene glycol (MIRALAX / GLYCOLAX) packet Take 17 g by  mouth daily. 14 each 0  . RANITIDINE HCL PO Take 1 tablet by mouth daily as needed (heartburn or indigestion).     . SENNA CO Take 1 tablet by mouth daily as needed (constipation).     [name] spironolactone (ALDACTONE) 25 MG tablet Take 1 tablet (25 mg total) by mouth every morning. 90 tablet 3   No current facility-administered medications for this visit.      PHYSICAL EXAMINATION: ECOG PERFORMANCE STATUS: 1  Blood pressure (!) 141/68, pulse 76, resp. rate 18, height  (1.854 m), weight 217 lb 11.2 oz (98.7 kg), SpO2 100 %.  GENERAL: Alert, awake  gentleman without distress. SKIN:, no skin rashes or lesions. EYES: Conjunctiva are pink and non-injected.  OROPHARYNX: No oral thrush noted.  NECK: Scars well healed on right neck area.  No thyromegaly. LYMPH:  no palpable lymphadenopathy in the cervical, axillary or supraclavicular LUNGS: clear to auscultation and percussion with normal breathing effort no dullness to percussion. HEART: regular rate & rhythm and no murmurs.  ABDOMEN:abdomen soft, non-tender and normal bowel sounds. No shifting dullness or ascites. Musculoskeletal:no cyanosis of digits and no clubbing  NEURO: Right-sided weakness noted. No new deficits noted.    Labs:  Lab Results  Component Value Date   WBC 7.9 06/30/2017   HGB 13.7 06/30/2017   HCT 41.6 06/30/2017   MCV 89.6 06/30/2017   PLT 221 06/30/2017   NEUTROABS 4.6 06/30/2017      Chemistry      Component Value Date/Time   NA 141 06/02/2017 0952   K 4.0 06/02/2017 0952   CL 103 02/22/2016 0330   CL 100 01/06/2013 0830   CO2 28 06/02/2017 0952   BUN 15.7 06/02/2017 0952   CREATININE 0.9 06/02/2017 0952      Component Value Date/Time   CALCIUM 9.4 06/02/2017 0952   ALKPHOS 81 06/02/2017 0952   AST 13 06/02/2017 0952   ALT 9 06/02/2017 0952   BILITOT 0.46 06/02/2017 0952     Results for [name], [name] (MRN [id number]) as of 06/30/2017 08:26  Ref. Range 06/02/2017 09:52  Iron Latest Ref Range: 42 - 163 ug/dL 41 (L)  UIBC Latest Ref Range: 117 - 376 ug/dL 409  TIBC Latest Ref Range: 202 - 409 ug/dL 811  %SAT Latest Ref Range: 20 - 55 % 12 (L)  Ferritin Latest Ref Range: 22 - 316 ng/ml 61  Vitamin B12 Latest Ref Range: 232 - 1,245 pg/mL 437    Assessment and plan:    71 year old gentleman with the following issues:   1.B-12 deficiency:He continues to receive vitamin B12 injections on a monthly basis which has helped his hemoglobin reasonably well.   His B12 levels remain maintained with is monthly injections. The plan is to continue with  1000 g on a monthly basis.We will repeat B12 levels in 6 months.  2. Iron deficiency anemia: He continues to receive intermittent Feraheme infusion.   Iron studies obtained on 06/02/2017 showed slight decrease in his iron levels and ferritin to be intact. The plan is to continue with observation and surveillance and replace IV iron if needed to in the future.  Etiology of his iron deficiency is unclear. He is up-to-date on colon cancer screening including colonoscopy.  3. History of stroke: He continues to have residual deficits on the right which has not changed.  4. Follow-up: Will be in 6 months.   Eli Hose MD 06/30/17

## 2017-06-30 NOTE — Telephone Encounter (Signed)
Gave avs and calendar for November  - april

## 2017-07-02 ENCOUNTER — Ambulatory Visit (INDEPENDENT_AMBULATORY_CARE_PROVIDER_SITE_OTHER): Payer: Medicare HMO | Admitting: *Deleted

## 2017-07-02 DIAGNOSIS — I255 Ischemic cardiomyopathy: Secondary | ICD-10-CM

## 2017-07-02 DIAGNOSIS — Z95810 Presence of automatic (implantable) cardiac defibrillator: Secondary | ICD-10-CM

## 2017-07-02 NOTE — Progress Notes (Signed)
Remote ICD transmission.   

## 2017-07-08 LAB — CUP PACEART REMOTE DEVICE CHECK
Battery Remaining Percentage: 80 %
Brady Statistic AP VS Percent: 76 %
Brady Statistic AS VP Percent: 1 %
Brady Statistic AS VS Percent: 23 %
Brady Statistic RA Percent Paced: 76 %
Brady Statistic RV Percent Paced: 1 %
Date Time Interrogation Session: 20181004094000
HIGH POWER IMPEDANCE MEASURED VALUE: 63 Ohm
HighPow Impedance: 63 Ohm
Implantable Lead Implant Date: 20090708
Implantable Lead Location: 753859
Implantable Lead Location: 753860
Lead Channel Impedance Value: 350 Ohm
Lead Channel Pacing Threshold Amplitude: 0.5 V
Lead Channel Pacing Threshold Amplitude: 1 V
Lead Channel Sensing Intrinsic Amplitude: 12 mV
Lead Channel Sensing Intrinsic Amplitude: 2.2 mV
Lead Channel Setting Pacing Amplitude: 2 V
Lead Channel Setting Pacing Pulse Width: 0.9 ms
Lead Channel Setting Sensing Sensitivity: 0.5 mV
MDC IDC LEAD IMPLANT DT: 20090708
MDC IDC MSMT BATTERY REMAINING LONGEVITY: 68 mo
MDC IDC MSMT BATTERY VOLTAGE: 2.98 V
MDC IDC MSMT LEADCHNL RA PACING THRESHOLD PULSEWIDTH: 0.5 ms
MDC IDC MSMT LEADCHNL RV IMPEDANCE VALUE: 330 Ohm
MDC IDC MSMT LEADCHNL RV PACING THRESHOLD PULSEWIDTH: 0.9 ms
MDC IDC PG IMPLANT DT: 20170106
MDC IDC SET LEADCHNL RV PACING AMPLITUDE: 2.5 V
MDC IDC STAT BRADY AP VP PERCENT: 1 %
Pulse Gen Serial Number: 7306179

## 2017-07-09 ENCOUNTER — Encounter: Payer: Self-pay | Admitting: Cardiology

## 2017-07-31 ENCOUNTER — Ambulatory Visit: Payer: Medicare HMO

## 2017-08-04 ENCOUNTER — Telehealth: Payer: Self-pay | Admitting: *Deleted

## 2017-08-04 NOTE — Telephone Encounter (Signed)
"  I missed my injection appointment last week.  Sorry I missed it, need to reschedule.  I usually call and told to come on in.  I can get there in thirty minutes or 3:10 pm."   Scheduling message sent with this request.  No appointments at this time.

## 2017-08-04 NOTE — Telephone Encounter (Signed)
"  I missed an appointment and need to reschedule."  Scheduling message has been sent.  A scheduler will call when scheduled.

## 2017-08-05 ENCOUNTER — Telehealth: Payer: Self-pay | Admitting: Oncology

## 2017-08-05 NOTE — Telephone Encounter (Signed)
Called patient per 11/6 sch message - unable to reach patient and left a message to call back and r/s missed appt.

## 2017-08-06 ENCOUNTER — Ambulatory Visit (HOSPITAL_BASED_OUTPATIENT_CLINIC_OR_DEPARTMENT_OTHER): Payer: Medicare HMO

## 2017-08-06 VITALS — BP 140/65 | HR 64 | Temp 97.6°F | Resp 20

## 2017-08-06 DIAGNOSIS — D519 Vitamin B12 deficiency anemia, unspecified: Secondary | ICD-10-CM

## 2017-08-06 DIAGNOSIS — D509 Iron deficiency anemia, unspecified: Secondary | ICD-10-CM

## 2017-08-06 MED ORDER — CYANOCOBALAMIN 1000 MCG/ML IJ SOLN
1000.0000 ug | Freq: Once | INTRAMUSCULAR | Status: AC
  Administered 2017-08-06: 1000 ug via INTRAMUSCULAR

## 2017-08-06 NOTE — Patient Instructions (Signed)

## 2017-08-31 ENCOUNTER — Ambulatory Visit (HOSPITAL_BASED_OUTPATIENT_CLINIC_OR_DEPARTMENT_OTHER): Payer: Medicare HMO

## 2017-08-31 VITALS — BP 147/74 | HR 88 | Temp 97.8°F | Resp 18

## 2017-08-31 DIAGNOSIS — D538 Other specified nutritional anemias: Secondary | ICD-10-CM

## 2017-08-31 DIAGNOSIS — D509 Iron deficiency anemia, unspecified: Secondary | ICD-10-CM

## 2017-08-31 MED ORDER — CYANOCOBALAMIN 1000 MCG/ML IJ SOLN
1000.0000 ug | Freq: Once | INTRAMUSCULAR | Status: AC
  Administered 2017-08-31: 1000 ug via INTRAMUSCULAR

## 2017-08-31 NOTE — Patient Instructions (Signed)

## 2017-09-14 ENCOUNTER — Encounter (HOSPITAL_COMMUNITY)
Admission: EM | Disposition: A | Discharge status: Home / Self Care | Payer: Self-pay | Source: Home / Self Care | Attending: Emergency Medicine

## 2017-09-14 ENCOUNTER — Emergency Department (HOSPITAL_COMMUNITY): Payer: Medicare HMO

## 2017-09-14 ENCOUNTER — Observation Stay (HOSPITAL_COMMUNITY): Payer: Medicare HMO

## 2017-09-14 ENCOUNTER — Other Ambulatory Visit: Payer: Self-pay

## 2017-09-14 ENCOUNTER — Observation Stay (HOSPITAL_COMMUNITY): Payer: Medicare HMO | Admitting: Anesthesiology

## 2017-09-14 ENCOUNTER — Encounter (HOSPITAL_COMMUNITY): Payer: Self-pay | Admitting: *Deleted

## 2017-09-14 ENCOUNTER — Observation Stay (HOSPITAL_COMMUNITY)
Admission: EM | Admit: 2017-09-14 | Discharge: 2017-09-15 | Disposition: A | Discharge status: Home / Self Care | Payer: Medicare HMO | Attending: Family Medicine | Admitting: Family Medicine

## 2017-09-14 DIAGNOSIS — I679 Cerebrovascular disease, unspecified: Secondary | ICD-10-CM | POA: Diagnosis present

## 2017-09-14 DIAGNOSIS — Z79899 Other long term (current) drug therapy: Secondary | ICD-10-CM | POA: Insufficient documentation

## 2017-09-14 DIAGNOSIS — E119 Type 2 diabetes mellitus without complications: Secondary | ICD-10-CM | POA: Diagnosis not present

## 2017-09-14 DIAGNOSIS — Z79891 Long term (current) use of opiate analgesic: Secondary | ICD-10-CM | POA: Diagnosis not present

## 2017-09-14 DIAGNOSIS — I130 Hypertensive heart and chronic kidney disease with heart failure and stage 1 through stage 4 chronic kidney disease, or unspecified chronic kidney disease: Secondary | ICD-10-CM | POA: Insufficient documentation

## 2017-09-14 DIAGNOSIS — Z951 Presence of aortocoronary bypass graft: Secondary | ICD-10-CM | POA: Insufficient documentation

## 2017-09-14 DIAGNOSIS — I480 Paroxysmal atrial fibrillation: Secondary | ICD-10-CM | POA: Insufficient documentation

## 2017-09-14 DIAGNOSIS — N189 Chronic kidney disease, unspecified: Secondary | ICD-10-CM | POA: Diagnosis not present

## 2017-09-14 DIAGNOSIS — I2510 Atherosclerotic heart disease of native coronary artery without angina pectoris: Secondary | ICD-10-CM | POA: Diagnosis not present

## 2017-09-14 DIAGNOSIS — Z7902 Long term (current) use of antithrombotics/antiplatelets: Secondary | ICD-10-CM | POA: Diagnosis not present

## 2017-09-14 DIAGNOSIS — E1351 Other specified diabetes mellitus with diabetic peripheral angiopathy without gangrene: Secondary | ICD-10-CM | POA: Diagnosis present

## 2017-09-14 DIAGNOSIS — E1122 Type 2 diabetes mellitus with diabetic chronic kidney disease: Secondary | ICD-10-CM | POA: Diagnosis not present

## 2017-09-14 DIAGNOSIS — I69351 Hemiplegia and hemiparesis following cerebral infarction affecting right dominant side: Secondary | ICD-10-CM | POA: Diagnosis not present

## 2017-09-14 DIAGNOSIS — E1151 Type 2 diabetes mellitus with diabetic peripheral angiopathy without gangrene: Secondary | ICD-10-CM | POA: Insufficient documentation

## 2017-09-14 DIAGNOSIS — Z96653 Presence of artificial knee joint, bilateral: Secondary | ICD-10-CM | POA: Insufficient documentation

## 2017-09-14 DIAGNOSIS — Z7982 Long term (current) use of aspirin: Secondary | ICD-10-CM | POA: Diagnosis not present

## 2017-09-14 DIAGNOSIS — I255 Ischemic cardiomyopathy: Secondary | ICD-10-CM | POA: Diagnosis not present

## 2017-09-14 DIAGNOSIS — Z7984 Long term (current) use of oral hypoglycemic drugs: Secondary | ICD-10-CM | POA: Diagnosis not present

## 2017-09-14 DIAGNOSIS — K3580 Unspecified acute appendicitis: Principal | ICD-10-CM | POA: Insufficient documentation

## 2017-09-14 DIAGNOSIS — G4730 Sleep apnea, unspecified: Secondary | ICD-10-CM | POA: Diagnosis not present

## 2017-09-14 DIAGNOSIS — I252 Old myocardial infarction: Secondary | ICD-10-CM | POA: Insufficient documentation

## 2017-09-14 DIAGNOSIS — Z87891 Personal history of nicotine dependence: Secondary | ICD-10-CM | POA: Diagnosis not present

## 2017-09-14 DIAGNOSIS — Z95810 Presence of automatic (implantable) cardiac defibrillator: Secondary | ICD-10-CM | POA: Diagnosis not present

## 2017-09-14 DIAGNOSIS — I10 Essential (primary) hypertension: Secondary | ICD-10-CM | POA: Diagnosis present

## 2017-09-14 DIAGNOSIS — I5042 Chronic combined systolic (congestive) and diastolic (congestive) heart failure: Secondary | ICD-10-CM | POA: Insufficient documentation

## 2017-09-14 HISTORY — PX: LAPAROSCOPIC APPENDECTOMY: SHX408

## 2017-09-14 LAB — COMPREHENSIVE METABOLIC PANEL
ALK PHOS: 74 U/L (ref 38–126)
ALT: 10 U/L — ABNORMAL LOW (ref 17–63)
ANION GAP: 8 (ref 5–15)
AST: 18 U/L (ref 15–41)
Albumin: 3.9 g/dL (ref 3.5–5.0)
BILIRUBIN TOTAL: 0.6 mg/dL (ref 0.3–1.2)
BUN: 17 mg/dL (ref 6–20)
CALCIUM: 9 mg/dL (ref 8.9–10.3)
CO2: 26 mmol/L (ref 22–32)
Chloride: 105 mmol/L (ref 101–111)
Creatinine, Ser: 0.97 mg/dL (ref 0.61–1.24)
Glucose, Bld: 215 mg/dL — ABNORMAL HIGH (ref 65–99)
Potassium: 4.2 mmol/L (ref 3.5–5.1)
Sodium: 139 mmol/L (ref 135–145)
TOTAL PROTEIN: 6.6 g/dL (ref 6.5–8.1)

## 2017-09-14 LAB — CBC WITH DIFFERENTIAL/PLATELET
BASOS ABS: 0 10*3/uL (ref 0.0–0.1)
BASOS PCT: 0 %
Eosinophils Absolute: 0.1 10*3/uL (ref 0.0–0.7)
Eosinophils Relative: 1 %
HEMATOCRIT: 39.5 % (ref 39.0–52.0)
HEMOGLOBIN: 12.6 g/dL — AB (ref 13.0–17.0)
Lymphocytes Relative: 12 %
Lymphs Abs: 1.3 10*3/uL (ref 0.7–4.0)
MCH: 28.3 pg (ref 26.0–34.0)
MCHC: 31.9 g/dL (ref 30.0–36.0)
MCV: 88.8 fL (ref 78.0–100.0)
MONO ABS: 1 10*3/uL (ref 0.1–1.0)
Monocytes Relative: 10 %
NEUTROS ABS: 7.7 10*3/uL (ref 1.7–7.7)
NEUTROS PCT: 77 %
Platelets: 183 10*3/uL (ref 150–400)
RBC: 4.45 MIL/uL (ref 4.22–5.81)
RDW: 14.4 % (ref 11.5–15.5)
WBC: 10.2 10*3/uL (ref 4.0–10.5)

## 2017-09-14 LAB — URINALYSIS, ROUTINE W REFLEX MICROSCOPIC
Bacteria, UA: NONE SEEN
Bilirubin Urine: NEGATIVE
HGB URINE DIPSTICK: NEGATIVE
Ketones, ur: NEGATIVE mg/dL
Leukocytes, UA: NEGATIVE
Nitrite: NEGATIVE
PROTEIN: NEGATIVE mg/dL
SPECIFIC GRAVITY, URINE: 1.009 (ref 1.005–1.030)
pH: 5 (ref 5.0–8.0)

## 2017-09-14 LAB — GLUCOSE, CAPILLARY
GLUCOSE-CAPILLARY: 209 mg/dL — AB (ref 65–99)
Glucose-Capillary: 110 mg/dL — ABNORMAL HIGH (ref 65–99)
Glucose-Capillary: 77 mg/dL (ref 65–99)
Glucose-Capillary: 103 mg/dL — ABNORMAL HIGH (ref 65–99)
Glucose-Capillary: 145 mg/dL — ABNORMAL HIGH (ref 65–99)

## 2017-09-14 LAB — SURGICAL PCR SCREEN
MRSA, PCR: NEGATIVE
STAPHYLOCOCCUS AUREUS: NEGATIVE

## 2017-09-14 LAB — LIPASE, BLOOD: LIPASE: 20 U/L (ref 11–51)

## 2017-09-14 SURGERY — APPENDECTOMY, LAPAROSCOPIC
Anesthesia: General

## 2017-09-14 MED ORDER — DEXAMETHASONE SODIUM PHOSPHATE 10 MG/ML IJ SOLN
INTRAMUSCULAR | Status: DC | PRN
  Administered 2017-09-14: 10 mg via INTRAVENOUS

## 2017-09-14 MED ORDER — FENTANYL CITRATE (PF) 250 MCG/5ML IJ SOLN
INTRAMUSCULAR | Status: AC
  Filled 2017-09-14: qty 5

## 2017-09-14 MED ORDER — LIDOCAINE 2% (20 MG/ML) 5 ML SYRINGE
INTRAMUSCULAR | Status: AC
  Filled 2017-09-14: qty 15

## 2017-09-14 MED ORDER — HYDROMORPHONE HCL 1 MG/ML IJ SOLN
1.0000 mg | INTRAMUSCULAR | Status: DC | PRN
  Administered 2017-09-14: 1 mg via INTRAVENOUS
  Filled 2017-09-14: qty 1

## 2017-09-14 MED ORDER — ETOMIDATE 2 MG/ML IV SOLN
INTRAVENOUS | Status: DC | PRN
  Administered 2017-09-14: 18 mg via INTRAVENOUS

## 2017-09-14 MED ORDER — SUCCINYLCHOLINE CHLORIDE 200 MG/10ML IV SOSY
PREFILLED_SYRINGE | INTRAVENOUS | Status: DC | PRN
  Administered 2017-09-14: 100 mg via INTRAVENOUS

## 2017-09-14 MED ORDER — ONDANSETRON HCL 4 MG/2ML IJ SOLN
INTRAMUSCULAR | Status: DC | PRN
  Administered 2017-09-14: 4 mg via INTRAVENOUS

## 2017-09-14 MED ORDER — LACTATED RINGERS IV SOLN
INTRAVENOUS | Status: DC
  Administered 2017-09-14: 17:00:00 via INTRAVENOUS

## 2017-09-14 MED ORDER — ROCURONIUM BROMIDE 10 MG/ML (PF) SYRINGE
PREFILLED_SYRINGE | INTRAVENOUS | Status: AC
  Filled 2017-09-14: qty 10

## 2017-09-14 MED ORDER — PROMETHAZINE HCL 25 MG/ML IJ SOLN
12.5000 mg | Freq: Once | INTRAMUSCULAR | Status: AC
  Administered 2017-09-14: 12.5 mg via INTRAVENOUS
  Filled 2017-09-14: qty 1

## 2017-09-14 MED ORDER — INSULIN ASPART 100 UNIT/ML ~~LOC~~ SOLN
0.0000 [IU] | Freq: Three times a day (TID) | SUBCUTANEOUS | Status: DC
  Administered 2017-09-15: 2 [IU] via SUBCUTANEOUS

## 2017-09-14 MED ORDER — SODIUM CHLORIDE 0.9 % IV SOLN
INTRAVENOUS | Status: DC
  Administered 2017-09-14: 07:00:00 via INTRAVENOUS

## 2017-09-14 MED ORDER — BUPIVACAINE-EPINEPHRINE 0.25% -1:200000 IJ SOLN
INTRAMUSCULAR | Status: DC | PRN
  Administered 2017-09-14: 10 mL

## 2017-09-14 MED ORDER — PIPERACILLIN-TAZOBACTAM 3.375 G IVPB 30 MIN
3.3750 g | Freq: Once | INTRAVENOUS | Status: AC
  Administered 2017-09-14: 3.375 g via INTRAVENOUS
  Filled 2017-09-14: qty 50

## 2017-09-14 MED ORDER — CEFAZOLIN SODIUM 1 G IJ SOLR
INTRAMUSCULAR | Status: AC
  Filled 2017-09-14: qty 20

## 2017-09-14 MED ORDER — PROPOFOL 10 MG/ML IV BOLUS
INTRAVENOUS | Status: AC
  Filled 2017-09-14: qty 20

## 2017-09-14 MED ORDER — LIDOCAINE 2% (20 MG/ML) 5 ML SYRINGE
INTRAMUSCULAR | Status: DC | PRN
  Administered 2017-09-14: 60 mg via INTRAVENOUS

## 2017-09-14 MED ORDER — CARVEDILOL 12.5 MG PO TABS
12.5000 mg | ORAL_TABLET | Freq: Two times a day (BID) | ORAL | Status: DC
  Administered 2017-09-15: 12.5 mg via ORAL
  Filled 2017-09-14: qty 1

## 2017-09-14 MED ORDER — SODIUM CHLORIDE 0.9 % IR SOLN
Status: DC | PRN
  Administered 2017-09-14: 1000 mL

## 2017-09-14 MED ORDER — SCOPOLAMINE 1 MG/3DAYS TD PT72
MEDICATED_PATCH | TRANSDERMAL | Status: AC
  Filled 2017-09-14: qty 1

## 2017-09-14 MED ORDER — PIPERACILLIN-TAZOBACTAM 3.375 G IVPB
3.3750 g | Freq: Three times a day (TID) | INTRAVENOUS | Status: DC
  Administered 2017-09-14 (×2): 3.375 g via INTRAVENOUS
  Filled 2017-09-14 (×4): qty 50

## 2017-09-14 MED ORDER — ENOXAPARIN SODIUM 40 MG/0.4ML ~~LOC~~ SOLN
40.0000 mg | SUBCUTANEOUS | Status: DC
  Administered 2017-09-15: 40 mg via SUBCUTANEOUS
  Filled 2017-09-14: qty 0.4

## 2017-09-14 MED ORDER — ONDANSETRON HCL 4 MG/2ML IJ SOLN
INTRAMUSCULAR | Status: AC
  Filled 2017-09-14: qty 8

## 2017-09-14 MED ORDER — IOPAMIDOL (ISOVUE-300) INJECTION 61%
100.0000 mL | Freq: Once | INTRAVENOUS | Status: AC | PRN
  Administered 2017-09-14: 100 mL via INTRAVENOUS

## 2017-09-14 MED ORDER — BUPIVACAINE HCL (PF) 0.25 % IJ SOLN
INTRAMUSCULAR | Status: AC
  Filled 2017-09-14: qty 30

## 2017-09-14 MED ORDER — ONDANSETRON HCL 4 MG/2ML IJ SOLN
4.0000 mg | Freq: Three times a day (TID) | INTRAMUSCULAR | Status: DC | PRN
  Administered 2017-09-14: 4 mg via INTRAVENOUS
  Filled 2017-09-14: qty 2

## 2017-09-14 MED ORDER — PROPOFOL 1000 MG/100ML IV EMUL
INTRAVENOUS | Status: AC
  Filled 2017-09-14: qty 200

## 2017-09-14 MED ORDER — ACETAMINOPHEN 500 MG PO TABS
1000.0000 mg | ORAL_TABLET | Freq: Three times a day (TID) | ORAL | Status: DC
  Administered 2017-09-14 – 2017-09-15 (×2): 1000 mg via ORAL
  Filled 2017-09-14 (×2): qty 2

## 2017-09-14 MED ORDER — 0.9 % SODIUM CHLORIDE (POUR BTL) OPTIME
TOPICAL | Status: DC | PRN
  Administered 2017-09-14: 1000 mL

## 2017-09-14 MED ORDER — OXYCODONE HCL 5 MG PO TABS
5.0000 mg | ORAL_TABLET | ORAL | Status: DC | PRN
  Administered 2017-09-15: 5 mg via ORAL
  Filled 2017-09-14: qty 1

## 2017-09-14 MED ORDER — FENTANYL CITRATE (PF) 100 MCG/2ML IJ SOLN
INTRAMUSCULAR | Status: DC | PRN
  Administered 2017-09-14: 50 ug via INTRAVENOUS
  Administered 2017-09-14: 25 ug via INTRAVENOUS
  Administered 2017-09-14: 50 ug via INTRAVENOUS

## 2017-09-14 MED ORDER — MORPHINE SULFATE (PF) 4 MG/ML IV SOLN: 1.0000 mg | INTRAVENOUS | Status: DC | PRN

## 2017-09-14 MED ORDER — DEXAMETHASONE SODIUM PHOSPHATE 10 MG/ML IJ SOLN
INTRAMUSCULAR | Status: AC
  Filled 2017-09-14: qty 3

## 2017-09-14 MED ORDER — SUGAMMADEX SODIUM 200 MG/2ML IV SOLN
INTRAVENOUS | Status: DC | PRN
  Administered 2017-09-14: 200 mg via INTRAVENOUS

## 2017-09-14 MED ORDER — ROCURONIUM BROMIDE 10 MG/ML (PF) SYRINGE
PREFILLED_SYRINGE | INTRAVENOUS | Status: DC | PRN
  Administered 2017-09-14 (×2): 10 mg via INTRAVENOUS
  Administered 2017-09-14: 30 mg via INTRAVENOUS

## 2017-09-14 MED ORDER — ACETAMINOPHEN 10 MG/ML IV SOLN: 1000.0000 mg | Freq: Once | INTRAVENOUS | Status: DC | PRN

## 2017-09-14 SURGICAL SUPPLY — 53 items
ADH SKN CLS APL DERMABOND .7 (GAUZE/BANDAGES/DRESSINGS) ×1
APPLIER CLIP ROT 10 11.4 M/L (STAPLE)
APR CLP MED LRG 11.4X10 (STAPLE)
BAG SPEC RTRVL LRG 6X4 10 (ENDOMECHANICALS) ×1
BLADE CLIPPER SURG (BLADE) ×2 IMPLANT
CANISTER SUCT 3000ML PPV (MISCELLANEOUS) ×3 IMPLANT
CHLORAPREP W/TINT 26ML (MISCELLANEOUS) ×3 IMPLANT
CLIP APPLIE ROT 10 11.4 M/L (STAPLE) IMPLANT
CLOSURE STERI-STRIP 1/2X4 (GAUZE/BANDAGES/DRESSINGS) ×1
CLOSURE WOUND 1/2 X4 (GAUZE/BANDAGES/DRESSINGS) ×1
CLSR STERI-STRIP ANTIMIC 1/2X4 (GAUZE/BANDAGES/DRESSINGS) ×1 IMPLANT
CONT SPEC 4OZ CLIKSEAL STRL BL (MISCELLANEOUS) ×2 IMPLANT
COVER SURGICAL LIGHT HANDLE (MISCELLANEOUS) ×3 IMPLANT
CUTTER FLEX LINEAR 45M (STAPLE) ×3 IMPLANT
DERMABOND ADVANCED (GAUZE/BANDAGES/DRESSINGS) ×2
DERMABOND ADVANCED .7 DNX12 (GAUZE/BANDAGES/DRESSINGS) ×1 IMPLANT
DRSG TEGADERM 2-3/8X2-3/4 SM (GAUZE/BANDAGES/DRESSINGS) ×9 IMPLANT
ELECT REM PT RETURN 9FT ADLT (ELECTROSURGICAL) ×3
ELECTRODE REM PT RTRN 9FT ADLT (ELECTROSURGICAL) ×1 IMPLANT
ENDOLOOP SUT PDS II  0 18 (SUTURE)
ENDOLOOP SUT PDS II 0 18 (SUTURE) IMPLANT
GLOVE BIOGEL M 6.5 STRL (GLOVE) ×2 IMPLANT
GLOVE BIOGEL M STER SZ 6 (GLOVE) ×2 IMPLANT
GLOVE BIOGEL M STRL SZ7.5 (GLOVE) ×2 IMPLANT
GLOVE BIOGEL PI IND STRL 8 (GLOVE) ×1 IMPLANT
GLOVE BIOGEL PI INDICATOR 8 (GLOVE) ×2
GLOVE ECLIPSE 7.5 STRL STRAW (GLOVE) ×3 IMPLANT
GOWN STRL REUS W/ TWL LRG LVL3 (GOWN DISPOSABLE) ×3 IMPLANT
GOWN STRL REUS W/TWL LRG LVL3 (GOWN DISPOSABLE) ×12
KIT BASIN OR (CUSTOM PROCEDURE TRAY) ×3 IMPLANT
KIT ROOM TURNOVER OR (KITS) ×3 IMPLANT
NS IRRIG 1000ML POUR BTL (IV SOLUTION) ×3 IMPLANT
PAD ARMBOARD 7.5X6 YLW CONV (MISCELLANEOUS) ×6 IMPLANT
POUCH SPECIMEN RETRIEVAL 10MM (ENDOMECHANICALS) ×3 IMPLANT
RELOAD 45 VASCULAR/THIN (ENDOMECHANICALS) IMPLANT
RELOAD STAPLE 45 2.5 WHT GRN (ENDOMECHANICALS) IMPLANT
RELOAD STAPLE 45 3.5 BLU ETS (ENDOMECHANICALS) IMPLANT
RELOAD STAPLE TA45 3.5 REG BLU (ENDOMECHANICALS) ×3 IMPLANT
SET IRRIG TUBING LAPAROSCOPIC (IRRIGATION / IRRIGATOR) ×3 IMPLANT
SHEARS HARMONIC ACE PLUS 36CM (ENDOMECHANICALS) ×3 IMPLANT
SLEEVE ENDOPATH XCEL 5M (ENDOMECHANICALS) ×3 IMPLANT
SPECIMEN JAR SMALL (MISCELLANEOUS) ×3 IMPLANT
STRIP CLOSURE SKIN 1/2X4 (GAUZE/BANDAGES/DRESSINGS) ×2 IMPLANT
SUT MNCRL AB 4-0 PS2 18 (SUTURE) ×3 IMPLANT
SUT VICRYL 0 UR6 27IN ABS (SUTURE) ×4 IMPLANT
TOWEL GREEN STERILE (TOWEL DISPOSABLE) ×2 IMPLANT
TOWEL OR 17X24 6PK STRL BLUE (TOWEL DISPOSABLE) ×1 IMPLANT
TOWEL OR 17X26 10 PK STRL BLUE (TOWEL DISPOSABLE) ×1 IMPLANT
TRAY FOLEY CATH SILVER 16FR (SET/KITS/TRAYS/PACK) ×3 IMPLANT
TRAY LAPAROSCOPIC MC (CUSTOM PROCEDURE TRAY) ×3 IMPLANT
TROCAR XCEL BLUNT TIP 100MML (ENDOMECHANICALS) ×3 IMPLANT
TROCAR XCEL NON-BLD 5MMX100MML (ENDOMECHANICALS) ×3 IMPLANT
TUBING INSUFFLATION (TUBING) ×3 IMPLANT

## 2017-09-14 NOTE — Progress Notes (Signed)
Attempted report to called in report three times to OR, no response.

## 2017-09-14 NOTE — Progress Notes (Signed)
Pt to be transferred to [facility] 3E Rm 11C. Report given to Alvira Philips [facility]). All questions were answered and no further questions at this time. Pt in stable condition and in no acute distress at time of discharge. Pt will be transported via [facility]. Pt has been informed of transfer to [facility].

## 2017-09-14 NOTE — Anesthesia Preprocedure Evaluation (Addendum)
Anesthesia Evaluation  Patient identified by MRN, date of birth, ID band Patient awake    Reviewed: Allergy & Precautions, NPO status , Patient's Chart, lab work & pertinent test results  Airway Mallampati: II  TM Distance: >3 FB Neck ROM: Full    Dental no notable dental hx.    Pulmonary neg pulmonary ROS, former smoker,    Pulmonary exam normal breath sounds clear to auscultation       Cardiovascular hypertension, + Past MI, + Peripheral Vascular Disease and +CHF  Normal cardiovascular exam+ Cardiac Defibrillator  Rhythm:Regular Rate:Normal  Left ventricle: The cavity size was mildly dilated. Systolic   function was severely reduced. The estimated ejection fraction   was in the range of 25% to 30%. Dyskinesis and scarring of the   apicalanteroseptal, anterior, inferior, inferoseptal, and apical   myocardium. Severe hypokinesis of the anteroseptal, anterior, and   anterolateral myocardium; consistent with infarction in the   distribution of the left anterior descending coronary artery.   Features are consistent with a pseudonormal left ventricular   filling pattern, with concomitant abnormal relaxation and   increased filling pressure (grade 2 diastolic dysfunction).   Acoustic contrast opacification revealed no evidence ofthrombus. - Mitral valve: Calcified annulus. There was mild regurgitation. - Left atrium: The atrium was severely dilated. - Right atrium: The atrium was mildly dilated.   Neuro/Psych negative neurological ROS  negative psych ROS   GI/Hepatic negative GI ROS, Neg liver ROS,   Endo/Other  diabetes  Renal/GU negative Renal ROS  negative genitourinary   Musculoskeletal negative musculoskeletal ROS (+)   Abdominal   Peds negative pediatric ROS (+)  Hematology negative hematology ROS (+)   Anesthesia Other Findings   Reproductive/Obstetrics negative OB ROS                             Anesthesia Physical Anesthesia Plan  ASA: IV  Anesthesia Plan: General   Post-op Pain Management:    Induction: Intravenous  PONV Risk Score and Plan: 2 and Ondansetron, Dexamethasone and Treatment may vary due to age or medical condition  Airway Management Planned: Oral ETT  Additional Equipment:   Intra-op Plan:   Post-operative Plan: Extubation in OR  Informed Consent: I have reviewed the patients History and Physical, chart, labs and discussed the procedure including the risks, benefits and alternatives for the proposed anesthesia with the patient or authorized representative who has indicated his/her understanding and acceptance.   Dental advisory given  Plan Discussed with: CRNA and Surgeon  Anesthesia Plan Comments:         Anesthesia Quick Evaluation

## 2017-09-14 NOTE — Progress Notes (Signed)
Pharmacy Antibiotic Note  [name] is a 71 y.o. male admitted on 09/14/2017 with intra-abdominal infection.  Pharmacy has been consulted for zosyn dosing.  Plan: Zosyn 3.375g IV q8h (4 hour infusion).  F/U cxs and clinical progress Monitor V/S, labs  Height: 6\' 1"  (185.4 cm) Weight: 208 lb 12.8 oz (94.7 kg) IBW/kg (Calculated) : 79.9  Temp (24hrs), Avg:97.6 F (36.4 C), Min:97.4 F (36.3 C), Max:97.8 F (36.6 C)  Recent Labs  Lab 09/14/17 0253  WBC 10.2  CREATININE 0.97    Estimated Creatinine Clearance: 80.1 mL/min (by C-G formula based on SCr of 0.97 mg/dL).    Allergies  Allergen Reactions  . Codeine Nausea And Vomiting    Antimicrobials this admission: Zosyn 12/17>>   Dose adjustments this admission: n/a  Microbiology results: No cx available  Thank you for allowing pharmacy to be a part of this patient's care.  Elder Cyphers, BS Pharm D, [place] Clinical Pharmacist Pager [phone] 09/14/2017 8:54 AM

## 2017-09-14 NOTE — Consult Note (Signed)
[facility] Consult/Admission Note  NORTH ESTERLINE Nov 26, 1945  [id number].    Requesting MD: Dr. Roderic Palau Chief Complaint/Reason for Consult: appendicitis   HPI:   Pt is a 71 year old male with a history of cardiomyopathy, ejection fraction of 25-30% status post AICD, CAD status post CABG, previous history of stroke with right sided residual weakness, diabetes, hypertension who presented to [facility] ER last night with complaints of RLQ abdominal pain. Pt states pain is intermittent, worse with movement, nonradiating, severe at times, with associated nausea and chills. Pt denies fever, vomiting, diarrhea, CP, SOB. Last plavix was yesterday (12/16) in the AM. Pt does not use tobacco or ETOH. Previous abdominal surgeries include a lap chole by Dr. Ninfa Linden. WBC is 10.2. CT scan showed findings suspicious for early acute appendicitis. Pt was transferred to [facility] after recommendations by anesthesia and general surgery.   ROS:  Review of Systems  Constitutional: Positive for chills. Negative for diaphoresis and fever.  HENT: Negative for sore throat.   Respiratory: Negative for cough and shortness of breath.   Cardiovascular: Negative for chest pain.  Gastrointestinal: Positive for abdominal pain and nausea. Negative for diarrhea and vomiting.  Skin: Negative for rash.  Neurological: Positive for focal weakness (chronic right sided from previous CVA). Negative for loss of consciousness.  All other systems reviewed and are negative.    Family History  Problem Relation Age of Onset  . Heart attack Mother        CVA, MI  . Diabetes Mother   . Heart attack Father        MI  . Diabetes Sister   . Coronary artery disease Brother        CABG    Past Medical History:  Diagnosis Date  . Anemia    Multifactorial  - hx of B12 def and iron deficiency, followed by heme[facility]  . Automatic implantable cardioverter-defibrillator in situ   . CAD (coronary  artery disease)    a. STEMI s/p CABG 07/2007 (had preop cardiogenic shock, IABP, VDRF before surgery)  . Cardiomyopathy, ischemic    a. Chronic systolic CHF s/p St. Jude dual chamber ICD 03/2008.  . Carotid stenosis    a. Prior carotid dz/ surgery. b. Carotid dopp 07/2011 - 0-39% bilaterally.;   c. Doppler 11/13: 94-49% RICA, 6-75% LICA  . Chronic renal insufficiency   . Diabetes mellitus   . GI bleed    Reported history  . Hx of CABG   . Hypertension    [place] [facility]   . Mitral regurgitation   . Myocardial infarction ([place])   . Neuromuscular disorder ([place])    R sided weakness   . Osteoarthritis   . PAF (paroxysmal atrial fibrillation) ([place])    a. Poor Coumadin candidate due to hx of GI bleed.  . Sinus bradycardia   . Sleep apnea    study done, 10 yrs ago, unable to tolerate CPAP  . Splenomegaly   . Stroke [facility])    a. CVA 1990s - chronic pain in RUE after stroke.  [name] Systolic CHF, chronic (HCC)    Previously taken off lisinopril by primary doctor due to labs  . Tobacco abuse     Past Surgical History:  Procedure Laterality Date  . CARDIAC DEFIBRILLATOR PLACEMENT  04/05/2008   Implantation of a St. Jude dual-chamber defibrillator, Champ Mungo. [name] , [place]    . CHOLECYSTECTOMY N/A 02/19/2016   Procedure:  LAPAROSCOPIC CHOLECYSTECTOMY;  Surgeon: Coralie Keens, MD;  Location: [place];  Service: General;  Laterality: N/A;  . COLONOSCOPY  07/2007   Rourk: left-sided diverticulum. TI normal. No etiology for IDA.  [name] CORONARY ARTERY BYPASS GRAFT    . CORONARY ARTERY BYPASS GRAFT  08/29/2007   x3; Braulio Conte H. Roxy Manns MD  . EP IMPLANTABLE DEVICE N/A 10/05/2015   Procedure: ICD Generator Changeout;  Surgeon: Evans Lance, MD;  Location: [place] [facility];  Service: Cardiovascular;  Laterality: N/A;  . ERCP N/A 02/18/2016   Procedure: ENDOSCOPIC RETROGRADE CHOLANGIOPANCREATOGRAPHY (ERCP);  Surgeon: Clarene Essex, MD;  Location: [facility] ENDOSCOPY;  Service:  Endoscopy;  Laterality: N/A;  . ESOPHAGOGASTRODUODENOSCOPY  07/2007   Rourk: small hh, atrophic gastric mucosa, poor distensibility of stomach ?extrinsic compression, CT showed mild to moderate splenomegaly. No etiology for IDA.  [name] JOINT REPLACEMENT  1990's   L knee  . Percutaneous coronary intervention  01/10/2005   Eustace Quail, MD  . TONSILLECTOMY    . TOTAL KNEE ARTHROPLASTY Right 08/16/2013   Procedure: RIGHT TOTAL KNEE ARTHROPLASTY;  Surgeon: Mcarthur Rossetti, MD;  Location: [place];  Service: Orthopedics;  Laterality: Right;    Social History:  reports that he quit smoking about 26 years ago. He has a 15.00 pack-year smoking history. He quit smokeless tobacco use about 29 years ago. He reports that he does not drink alcohol or use drugs.  Allergies:  Allergies  Allergen Reactions  . Codeine Nausea And Vomiting    Medications Prior to Admission  Medication Sig Dispense Refill  . aspirin EC 81 MG EC tablet Take 1 tablet (81 mg total) by mouth daily.    [name] atorvastatin (LIPITOR) 80 MG tablet Take 0.5 tablets (40 mg total) by mouth every other day. 90 tablet 3  . baclofen (LIORESAL) 20 MG tablet Take 20 mg by mouth 2 (two) times daily.     . Canagliflozin (INVOKANA) 300 MG TABS Take 1 tablet by mouth daily.    . carvedilol (COREG) 12.5 MG tablet Take 1 tablet (12.5 mg total) by mouth 2 (two) times daily with a meal. 180 tablet 3  . clopidogrel (PLAVIX) 75 MG tablet Take 1 tablet (75 mg total) by mouth daily. 90 tablet 3  . furosemide (LASIX) 40 MG tablet Take 40 mg by mouth daily as needed for fluid.     [name] linagliptin (TRADJENTA) 5 MG TABS tablet Take 5 mg by mouth daily after breakfast.     . meclizine (ANTIVERT) 25 MG tablet Take 25 mg by mouth 4 (four) times daily as needed. For dizziness    . oxyCODONE-acetaminophen (PERCOCET) 10-325 MG per tablet Take 1 tablet by mouth every 4 (four) hours as needed for pain.     . polyethylene glycol (MIRALAX / GLYCOLAX) packet Take 17 g  by mouth daily. 14 each 0  . SENNA CO Take 1 tablet by mouth daily as needed (constipation).     [name] spironolactone (ALDACTONE) 25 MG tablet Take 1 tablet (25 mg total) by mouth every morning. 90 tablet 3    Blood pressure 134/79, pulse (!) 59, temperature 97.8 F (36.6 C), temperature source Oral, resp. rate (!) 21, height _0  (1.854 m), weight 207 lb 4 oz (94 kg), SpO2 100 %.  Physical Exam  Constitutional: He is oriented to person, place, and time and well-developed, well-nourished, and in no distress. No distress.  HENT:  Head: Normocephalic and atraumatic.  Nose: Nose normal.  Mouth/Throat: Oropharynx is clear and  moist. No oropharyngeal exudate.  Eyes: Conjunctivae are normal. Pupils are equal, round, and reactive to light. Right eye exhibits no discharge. Left eye exhibits no discharge. No scleral icterus.  Neck: Normal range of motion. Neck supple. No thyromegaly present.  Cardiovascular: Normal rate, regular rhythm, normal heart sounds and intact distal pulses.  No murmur heard. Pulses:      Radial pulses are 2+ on the right side, and 2+ on the left side.       Dorsalis pedis pulses are 2+ on the left side.  Pulmonary/Chest: Effort normal and breath sounds normal. He has no wheezes. He has no rhonchi. He has no rales.  Abdominal: Soft. Normal appearance and bowel sounds are normal. He exhibits no distension. There is no hepatosplenomegaly. There is tenderness in the right upper quadrant. There is tenderness at McBurney's point. There is no guarding.  Musculoskeletal: He exhibits edema (1+ of BLE). He exhibits no tenderness or deformity.  Lymphadenopathy:    He has no cervical adenopathy.  Neurological: He is alert and oriented to person, place, and time. GCS score is 15.  Skin: Skin is warm and dry. He is not diaphoretic.  Psychiatric: Mood and affect normal.  Nursing note and vitals reviewed.   Results for orders placed or performed during the hospital encounter of 09/14/17  (from the past 48 hour(s))  Urinalysis, Routine w reflex microscopic     Status: Abnormal   Collection Time: 09/14/17  2:45 AM  Result Value Ref Range   Color, Urine STRAW (A) YELLOW   APPearance CLEAR CLEAR   Specific Gravity, Urine 1.009 1.005 - 1.030   pH 5.0 5.0 - 8.0   Glucose, UA >=500 (A) NEGATIVE mg/dL   Hgb urine dipstick NEGATIVE NEGATIVE   Bilirubin Urine NEGATIVE NEGATIVE   Ketones, ur NEGATIVE NEGATIVE mg/dL   Protein, ur NEGATIVE NEGATIVE mg/dL   Nitrite NEGATIVE NEGATIVE   Leukocytes, UA NEGATIVE NEGATIVE   RBC / HPF 0-5 0 - 5 RBC/hpf   WBC, UA 0-5 0 - 5 WBC/hpf   Bacteria, UA NONE SEEN NONE SEEN   Squamous Epithelial / LPF 0-5 (A) NONE SEEN  Comprehensive metabolic panel     Status: Abnormal   Collection Time: 09/14/17  2:53 AM  Result Value Ref Range   Sodium 139 135 - 145 mmol/L   Potassium 4.2 3.5 - 5.1 mmol/L   Chloride 105 101 - 111 mmol/L   CO2 26 22 - 32 mmol/L   Glucose, Bld 215 (H) 65 - 99 mg/dL   BUN 17 6 - 20 mg/dL   Creatinine, Ser 0.97 0.61 - 1.24 mg/dL   Calcium 9.0 8.9 - 10.3 mg/dL   Total Protein 6.6 6.5 - 8.1 g/dL   Albumin 3.9 3.5 - 5.0 g/dL   AST 18 15 - 41 U/L   ALT 10 (L) 17 - 63 U/L   Alkaline Phosphatase 74 38 - 126 U/L   Total Bilirubin 0.6 0.3 - 1.2 mg/dL   GFR calc non Af Amer >60 >60 mL/min   GFR calc Af Amer >60 >60 mL/min    Comment: (NOTE) The eGFR has been calculated using the CKD EPI equation. This calculation has not been validated in all clinical situations. eGFR's persistently <60 mL/min signify possible Chronic Kidney Disease.    Anion gap 8 5 - 15  CBC with Differential/Platelet     Status: Abnormal   Collection Time: 09/14/17  2:53 AM  Result Value Ref Range   WBC 10.2 4.0 -  10.5 K/uL   RBC 4.45 4.22 - 5.81 MIL/uL   Hemoglobin 12.6 (L) 13.0 - 17.0 g/dL   HCT 39.5 39.0 - 52.0 %   MCV 88.8 78.0 - 100.0 fL   MCH 28.3 26.0 - 34.0 pg   MCHC 31.9 30.0 - 36.0 g/dL   RDW 14.4 11.5 - 15.5 %   Platelets 183 150 - 400  K/uL   Neutrophils Relative % 77 %   Neutro Abs 7.7 1.7 - 7.7 K/uL   Lymphocytes Relative 12 %   Lymphs Abs 1.3 0.7 - 4.0 K/uL   Monocytes Relative 10 %   Monocytes Absolute 1.0 0.1 - 1.0 K/uL   Eosinophils Relative 1 %   Eosinophils Absolute 0.1 0.0 - 0.7 K/uL   Basophils Relative 0 %   Basophils Absolute 0.0 0.0 - 0.1 K/uL  Lipase, blood     Status: None   Collection Time: 09/14/17  2:53 AM  Result Value Ref Range   Lipase 20 11 - 51 U/L  Glucose, capillary     Status: Abnormal   Collection Time: 09/14/17  7:46 AM  Result Value Ref Range   Glucose-Capillary 110 (H) 65 - 99 mg/dL  Glucose, capillary     Status: None   Collection Time: 09/14/17 11:11 AM  Result Value Ref Range   Glucose-Capillary 77 65 - 99 mg/dL   Ct Abdomen Pelvis W Contrast  Result Date: 09/14/2017 CLINICAL DATA:  Right-sided abdominal pain, onset yesterday. EXAM: CT ABDOMEN AND PELVIS WITH CONTRAST TECHNIQUE: Multidetector CT imaging of the abdomen and pelvis was performed using the standard protocol following bolus administration of intravenous contrast. CONTRAST:  174m ISOVUE-300 IOPAMIDOL (ISOVUE-300) INJECTION 61% COMPARISON:  CT abdomen 02/15/2016, CT abdomen/ pelvis 03/06/2013 FINDINGS: Lower chest: Mild left basilar atelectasis.  No pleural fluid. Hepatobiliary: No focal hepatic lesion. There is an delayed heterogeneous soft tissue density measuring 2.5 x 2.3 cm adjacent to the right hepatic lobe abutting the intercostal muscles. No biliary dilatation. Post cholecystectomy. Pancreas: Fatty atrophy.  No ductal dilatation or inflammation. Spleen: Normal in size without focal abnormality. Adrenals/Urinary Tract: Normal adrenal glands. No hydronephrosis or perinephric edema. Homogeneous renal enhancement with symmetric excretion on delayed phase imaging. Urinary bladder is minimally distended without wall thickening. Stomach/Bowel: Mild appendiceal dilatation measuring 10 mm with intraluminal low-density  (low-density appreciated on image 47 series 2). Mild appendiceal wall enhancement. Vague hyperdensity in the proximal appendix may be appendicolith. There is minimal periappendiceal inflammation. Appendix: Location: Retrocecal. Diameter: 10 mm Appendicolith: Probably. Mucosal hyper-enhancement: Yes. Extraluminal gas: [name] Periappendiceal collection: [name] Stomach is nondistended. No small bowel inflammation or obstruction. No bowel wall thickening. Small volume of colonic stool. Sigmoid colon is tortuous. Vascular/Lymphatic: Moderate aortic atherosclerosis. No enlarged abdominal or pelvic lymph nodes. Reproductive: Prostate is unremarkable. Other: No free air, free fluid, or intra-abdominal fluid collection. Minimal fat in the left inguinal canal. Musculoskeletal: Degenerative change in the lower lumbar spine. There are no acute or suspicious osseous abnormalities. IMPRESSION: 1. Findings suspicious for early acute appendicitis. Mild appendiceal dilatation containing intra low density, with mucosal enhancement, possible appendicolith and minimal periappendiceal inflammation. The intraluminal low-density raises the possibility of appendiceal mucocele. No perforation. 2. Ovoid 2.5 cm heterogeneous intrahepatic soft tissue density adjacent of the right lobe of the liver in the region of the intercostal muscles, likely sequela of interval cholecystectomy. A dropped gallstone could produce this appearance. 3.  Aortic Atherosclerosis (ICD10-I70.0). Electronically Signed   By: MJeb LeveringM.D.   On: 09/14/2017 04:28  Dg Chest Port 1 View  Result Date: 09/14/2017 CLINICAL DATA:  Shortness of breath. EXAM: PORTABLE CHEST 1 VIEW COMPARISON:  02/15/2016 FINDINGS: The heart is mildly enlarged but stable. Prominent mediastinal and hilar contours are unchanged. Stable pacer wires. Stable surgical changes from bypass surgery. No acute pulmonary findings. The bony thorax is intact. Stable surgical changes in the right neck  area. IMPRESSION: Cardiac enlargement but no acute pulmonary findings. Electronically Signed   By: Marijo Sanes M.D.   On: 09/14/2017 13:45      Assessment/Plan  Principal Problem:   Acute appendicitis, uncomplicated Active Problems:   DM (diabetes mellitus), secondary, with peripheral vascular complications ([place])   Essential hypertension   CEREBROVASCULAR DISEASE   Automatic implantable cardioverter-defibrillator in situ   CORONARY ARTERY BYPASS GRAFT, HX OF   Chronic combined systolic and diastolic CHF (congestive heart failure) (HCC)  Appendicitis - last does of Plavix was yesterday morning - will need to go to OR possibly today or first thing tomorrow morning. Will discuss timing with MD. Keep NPO, continue IVF and IV abx.   Thank you for the consult   Kalman Drape, [facility] 09/14/2017, 2:43 PM Pager: [phone] Consults: [phone] Mon-Fri 7:00 am-4:30 pm Sat-Sun 7:00 am-11:30 am

## 2017-09-14 NOTE — Progress Notes (Signed)
Addendum: Discussed patient with anesthesia at [facility].  He is not comfortable with patient having possible surgery here due to his coomorbidities.  He recommends transfer to [facility] for further management and treatment should surgery be required.  This was discussed with Dr. Kerry Hough.

## 2017-09-14 NOTE — ED Triage Notes (Signed)
Pt c/o right side abdominal pain that started this evening while watching TV; pt denies any n/v/d but states he has pain when he takes a deep breath

## 2017-09-14 NOTE — H&P (Signed)
TRH H&P    Patient Demographics:    [name], is a 71 y.o. male  MRN: [id number]  DOB - 1946-04-12  Admit Date - 09/14/2017  Referring MD/NP/PA: Dr Bebe Shaggy  Outpatient Primary MD for the patient is [name], DO  Patient coming from: home  Chief Complaint  Patient presents with  . Abdominal Pain      HPI:    [name]  is a 71 y.o. male, with history of CAD status post CABG in 2008, his cubicle remember to, chronic systolic CHF status post St. Jude dual chamber ICD, diabetes mellitus,  paroxysmal atrial fibrillation, no anti-globalization due to history of G.I. bleed, carotid stenosis came to hospital with abdominal pain. Patient stated that pain started yesterday and has been constant. Become worse on movement and improves on laying still.  He denies nausea and vomiting. Had one episode of loose stool. He denies chest pain or shortness of breath. No fever  denies dysuria, urgency or frequency of urination.  In the ED, CT scan of the abdomen shows appendicitis. Patient empirically started on IV Zosyn General surgery was consulted Dr. Lovell Sheehan to see the patient.   Review of systems:     All other systems reviewed and are negative.   With Past History of the following :    Past Medical History:  Diagnosis Date  . Anemia    Multifactorial  - hx of B12 def and iron deficiency, [facility] by heme[facility]  . Automatic implantable cardioverter-defibrillator in situ   . CAD (coronary artery disease)    a. STEMI s/p CABG 07/2007 (had preop cardiogenic shock, IABP, VDRF before surgery)  . Cardiomyopathy, ischemic    a. Chronic systolic CHF s/p St. Jude dual chamber ICD 03/2008.  . Carotid stenosis    a. Prior carotid dz/ surgery. b. Carotid dopp 07/2011 - 0-39% bilaterally.;   c. Doppler 11/13: 40-59% RICA, 0-39% LICA  . Chronic renal insufficiency   .  Diabetes mellitus   . GI bleed    Reported history  . Hx of CABG   . Hypertension    [facility]   . Mitral regurgitation   . Myocardial infarction (HCC)   . Neuromuscular disorder (HCC)    R sided weakness   . Osteoarthritis   . PAF (paroxysmal atrial fibrillation) (HCC)    a. Poor Coumadin candidate due to hx of GI bleed.  . Sinus bradycardia   . Sleep apnea    study done, 10 yrs ago, unable to tolerate CPAP  . Splenomegaly   . Stroke [facility])    a. CVA 1990s - chronic pain in RUE after stroke.  [name] Systolic CHF, chronic (HCC)    Previously taken off lisinopril by primary doctor due to labs  . Tobacco abuse       Past Surgical History:  Procedure Laterality Date  . CARDIAC DEFIBRILLATOR PLACEMENT  04/05/2008   Implantation of a St. Jude dual-chamber defibrillator, Doylene Canning. Ladona Ridgel , MD  . CARDIAC DEFIBRILLATOR PLACEMENT    .  CHOLECYSTECTOMY N/A 02/19/2016   Procedure: LAPAROSCOPIC CHOLECYSTECTOMY;  Surgeon: Abigail Miyamoto, MD;  Location: [facility] OR;  Service: General;  Laterality: N/A;  . COLONOSCOPY  07/2007   Rourk: left-sided diverticulum. TI normal. No etiology for IDA.  [name] CORONARY ARTERY BYPASS GRAFT    . CORONARY ARTERY BYPASS GRAFT  08/29/2007   x3; Marilu Favre H. Cornelius Moras MD  . EP IMPLANTABLE DEVICE N/A 10/05/2015   Procedure: ICD Generator Changeout;  Surgeon: Marinus Maw, MD;  Location: [facility] INVASIVE CV LAB;  Service: Cardiovascular;  Laterality: N/A;  . ERCP N/A 02/18/2016   Procedure: ENDOSCOPIC RETROGRADE CHOLANGIOPANCREATOGRAPHY (ERCP);  Surgeon: Vida Rigger, MD;  Location: [facility] ENDOSCOPY;  Service: Endoscopy;  Laterality: N/A;  . ESOPHAGOGASTRODUODENOSCOPY  07/2007   Rourk: small hh, atrophic gastric mucosa, poor distensibility of stomach ?extrinsic compression, CT showed mild to moderate splenomegaly. No etiology for IDA.  [name] JOINT REPLACEMENT  1990's   L knee  . Percutaneous coronary intervention  01/10/2005   Charlies Constable, MD  . TONSILLECTOMY    . TOTAL KNEE  ARTHROPLASTY Right 08/16/2013   Procedure: RIGHT TOTAL KNEE ARTHROPLASTY;  Surgeon: Kathryne Hitch, MD;  Location: [facility] OR;  Service: Orthopedics;  Laterality: Right;      Social History:      Social History   Tobacco Use  . Smoking status: Former Smoker    Packs/day: 1.00    Years: 15.00    Pack years: 15.00    Last attempt to quit: 09/29/1990    Years since quitting: 26.9  . Smokeless tobacco: Former Neurosurgeon    Quit date: 08/09/1988  Substance Use Topics  . Alcohol use: No       Family History :     Family History  Problem Relation Age of Onset  . Heart attack Mother        CVA, MI  . Diabetes Mother   . Heart attack Father        MI  . Diabetes Sister   . Coronary artery disease Brother        CABG      Home Medications:   Prior to Admission medications   Medication Sig Start Date End Date Taking? Authorizing Provider  aspirin EC 81 MG EC tablet Take 1 tablet (81 mg total) by mouth daily. 07/29/12   Arguello, Roger A, PA-C  atorvastatin (LIPITOR) 80 MG tablet Take 0.5 tablets (40 mg total) by mouth every other day. 07/20/14   Marinus Maw, MD  baclofen (LIORESAL) 20 MG tablet Take 20 mg by mouth 2 (two) times daily.     [provider]  calcium carbonate (TUMS - DOSED IN MG ELEMENTAL CALCIUM) 500 MG chewable tablet Chew 1 tablet by mouth 3 (three) times daily as needed for indigestion or heartburn.  02/14/14   [provider]  Canagliflozin (INVOKANA) 300 MG TABS Take 1 tablet by mouth daily.    [provider]  carvedilol (COREG) 12.5 MG tablet Take 1 tablet (12.5 mg total) by mouth 2 (two) times daily with a meal. 11/03/14   Laqueta Linden, MD  clopidogrel (PLAVIX) 75 MG tablet Take 1 tablet (75 mg total) by mouth daily. 01/21/17   Marinus Maw, MD  furosemide (LASIX) 40 MG tablet Take 40 mg by mouth daily as needed for fluid.     [provider]  glimepiride (AMARYL) 4 MG tablet Take 4 mg by mouth 2 (two) times  daily.     [provider]  linagliptin (  TRADJENTA) 5 MG TABS tablet Take 5 mg by mouth daily after breakfast.     [provider]  meclizine (ANTIVERT) 25 MG tablet Take 25 mg by mouth 4 (four) times daily as needed. For dizziness 02/26/12   [provider]  metoCLOPramide (REGLAN) 10 MG tablet Take 1 tablet (10 mg total) by mouth 3 (three) times daily before meals. 02/22/16   Rolly SalterPatel, Pranav M, MD  nitroGLYCERIN (NITROSTAT) 0.4 MG SL tablet Place 1 tablet (0.4 mg total) under the tongue every 5 (five) minutes as needed for chest pain. 01/02/16   Marinus Mawaylor, Gregg W, MD  oxyCODONE-acetaminophen (PERCOCET) 10-325 MG per tablet Take 1 tablet by mouth every 4 (four) hours as needed for pain.  01/26/14   [provider]  polyethylene glycol (MIRALAX / GLYCOLAX) packet Take 17 g by mouth daily. 02/22/16   Rolly SalterPatel, Pranav M, MD  RANITIDINE HCL PO Take 1 tablet by mouth daily as needed (heartburn or indigestion).  12/17/13   [provider]  SENNA CO Take 1 tablet by mouth daily as needed (constipation).  01/10/14   [provider]  spironolactone (ALDACTONE) 25 MG tablet Take 1 tablet (25 mg total) by mouth every morning. 07/20/14   Marinus Mawaylor, Gregg W, MD     Allergies:     Allergies  Allergen Reactions  . Codeine Nausea And Vomiting     Physical Exam:   Vitals  Blood pressure 116/61, pulse 60, temperature (!) 97.4 F (36.3 C), temperature source Oral, resp. rate (!) 22, height 6\' 1"  (1.854 m), weight 97.1 kg (214 lb), SpO2 97 %.  1.  General: appears in no acute distress  2. Psychiatric:  Intact judgement and  insight, awake alert, oriented x 3.  3. Neurologic: No focal neurological deficits, all cranial nerves intact.Strength 5/5 all 4 extremities, sensation intact all 4 extremities, plantars down going.  4. Eyes :  anicteric sclerae, moist conjunctivae with no lid lag. PERRLA.  5. ENMT:  Oropharynx clear with moist mucous membranes and good  dentition  6. Neck:  supple, no cervical lymphadenopathy appriciated, No thyromegaly  7. Respiratory : Normal respiratory effort, good air movement bilaterally,clear to  auscultation bilaterally  8. Cardiovascular : RRR, no gallops, rubs or murmurs, no leg edema  9. Gastrointestinal:  Positive bowel sounds, abdomen soft, positive right lower quadrant tenderness to palpation,,no hepatosplenomegaly, no rigidity or guarding       10. Skin:  No cyanosis, normal texture and turgor, no rash, lesions or ulcers  11.Musculoskeletal:  Good muscle tone,  joints appear normal , no effusions,  normal range of motion    Data Review:    CBC Recent Labs  Lab 09/14/17 0253  WBC 10.2  HGB 12.6*  HCT 39.5  PLT 183  MCV 88.8  MCH 28.3  MCHC 31.9  RDW 14.4  LYMPHSABS 1.3  MONOABS 1.0  EOSABS 0.1  BASOSABS 0.0   ------------------------------------------------------------------------------------------------------------------  Chemistries  Recent Labs  Lab 09/14/17 0253  NA 139  K 4.2  CL 105  CO2 26  GLUCOSE 215*  BUN 17  CREATININE 0.97  CALCIUM 9.0  AST 18  ALT 10*  ALKPHOS 74  BILITOT 0.6   ------------------------------------------------------------------------------------------------------------------  ------------------------------------------------------------------------------------------------------------------ GFR: Estimated Creatinine Clearance: 87 mL/min (by C-G formula based on SCr of 0.97 mg/dL). Liver Function Tests: Recent Labs  Lab 09/14/17 0253  AST 18  ALT 10*  ALKPHOS 74  BILITOT 0.6  PROT 6.6  ALBUMIN 3.9   Recent Labs  Lab 09/14/17  0253  LIPASE 20    --------------------------------------------------------------------------------------------------------------- Urine analysis:    Component Value Date/Time   COLORURINE STRAW (A) 09/14/2017 0245   APPEARANCEUR CLEAR 09/14/2017 0245   LABSPEC 1.009 09/14/2017 0245   PHURINE 5.0  09/14/2017 0245   GLUCOSEU >=500 (A) 09/14/2017 0245   HGBUR NEGATIVE 09/14/2017 0245   BILIRUBINUR NEGATIVE 09/14/2017 0245   KETONESUR NEGATIVE 09/14/2017 0245   PROTEINUR NEGATIVE 09/14/2017 0245   UROBILINOGEN 0.2 08/09/2013 1012   NITRITE NEGATIVE 09/14/2017 0245   LEUKOCYTESUR NEGATIVE 09/14/2017 0245      Imaging Results:    Ct Abdomen Pelvis W Contrast  Result Date: 09/14/2017 CLINICAL DATA:  Right-sided abdominal pain, onset yesterday. EXAM: CT ABDOMEN AND PELVIS WITH CONTRAST TECHNIQUE: Multidetector CT imaging of the abdomen and pelvis was performed using the standard protocol following bolus administration of intravenous contrast. CONTRAST:  ISOVUE-300 IOPAMIDOL (ISOVUE-300) INJECTION 61% COMPARISON:  CT abdomen 02/15/2016, CT abdomen/ pelvis 03/06/2013 FINDINGS: Lower chest: Mild left basilar atelectasis.  No pleural fluid. Hepatobiliary: No focal hepatic lesion. There is an delayed heterogeneous soft tissue density measuring 2.5 x 2.3 cm adjacent to the right hepatic lobe abutting the intercostal muscles. No biliary dilatation. Post cholecystectomy. Pancreas: Fatty atrophy.  No ductal dilatation or inflammation. Spleen: Normal in size without focal abnormality. Adrenals/Urinary Tract: Normal adrenal glands. No hydronephrosis or perinephric edema. Homogeneous renal enhancement with symmetric excretion on delayed phase imaging. Urinary bladder is minimally distended without wall thickening. Stomach/Bowel: Mild appendiceal dilatation measuring 10 mm with intraluminal low-density (low-density appreciated on image 47 series 2). Mild appendiceal wall enhancement. Vague hyperdensity in the proximal appendix may be appendicolith. There is minimal periappendiceal inflammation. Appendix: Location: Retrocecal. Diameter: 10 mm Appendicolith: Probably. Mucosal hyper-enhancement: Yes. Extraluminal gas: [name] Periappendiceal collection: [name] Stomach is nondistended. No small bowel inflammation or  obstruction. No bowel wall thickening. Small volume of colonic stool. Sigmoid colon is tortuous. Vascular/Lymphatic: Moderate aortic atherosclerosis. No enlarged abdominal or pelvic lymph nodes. Reproductive: Prostate is unremarkable. Other: No free air, free fluid, or intra-abdominal fluid collection. Minimal fat in the left inguinal canal. Musculoskeletal: Degenerative change in the lower lumbar spine. There are no acute or suspicious osseous abnormalities. IMPRESSION: 1. Findings suspicious for early acute appendicitis. Mild appendiceal dilatation containing intra low density, with mucosal enhancement, possible appendicolith and minimal periappendiceal inflammation. The intraluminal low-density raises the possibility of appendiceal mucocele. No perforation. 2. Ovoid 2.5 cm heterogeneous intrahepatic soft tissue density adjacent of the right lobe of the liver in the region of the intercostal muscles, likely sequela of interval cholecystectomy. A dropped gallstone could produce this appearance. 3.  Aortic Atherosclerosis (ICD10-I70.0). Electronically Signed   By: Rubye Oaks M.D.   On: 09/14/2017 04:28    My personal review of EKG: Rhythm -paced  rhythm   Assessment & Plan:    Active Problems:   Appendicitis   1. Appendicitis-CT findings consistent with acute appendicitis. Will keep NPO. Start IV Sosan. General surgery has been consulted. Dr. Lovell Sheehan to see the patient. 2. CAD status post CABG- stable, patient has no functional limitation. Will hold aspirin and Plavix in anticipation for surgery. Continue Coreg 12.5 mg PO BID. 3. Chronic systolic CHF/ischemic cardiomyopathy-status post ICD placement, stable. Hold Lasix and Aldactone at this time 4. Diabetes mellitus-hold oral hypoglycemic agents, start sliding scale insulin NovoLog.    DVT Prophylaxis-   Lovenox   AM Labs Ordered, also please review Full Orders  Family Communication: Admission, patients condition and plan of care  including tests being  ordered have been discussed with the patient * who indicate understanding and agree with the plan and Code Status.  Code Status: full code  Admission status: inpatient  Time spent in minutes : 60 minutes   Meredeth Ide M.D on 09/14/2017 at 5:31 AM  Between 7am to 7pm - Pager - [phone]. After 7pm go to [email or web address] - password [facility]  [facility] - Office  [phone]

## 2017-09-14 NOTE — Op Note (Signed)
OPERATIVE REPORT  DATE OF OPERATION: 09/14/2017  PATIENT:  [name]  71 y.o. male  PRE-OPERATIVE DIAGNOSIS:  APPENDICITIS  POST-OPERATIVE DIAGNOSIS:  APPENDICITIS  INDICATION(S) FOR OPERATION:  Abdominal pain and CT demonstrating findings consistent with acute appendicitis  FINDINGS:  Acute inflammation and adhesions  PROCEDURE:  Procedure(s): APPENDECTOMY LAPAROSCOPIC  SURGEON:  Surgeon(s): Jimmye Norman, MD  ASSISTANTAlto Denver, PA-S  ANESTHESIA:   general  COMPLICATIONS:  None  EBL: <20 ml  BLOOD ADMINISTERED: none  DRAINS: none   SPECIMEN:  Source of Specimen:  Appendix  COUNTS CORRECT:  YES  PROCEDURE DETAILS: The patient was taken to the operating room and placed on the table in the supine position.  After an adequate general endotracheal anesthetic was administered, he was prepped and draped in usual sterile manner exposing the abdomen.  A proper timeout was performed identifying the patient and the procedure to be performed.  An infraumbilical vertical incision was made using a #15 blade and taken down to the midline fascia.  We incised the fascia using a 15 blade and bluntly dissected down into the peritoneal cavity while tenting up with Kocher clamps.  Once we were in the peritoneal cavity a pursestring suture of 0 Vicryl was passed around the fascial opening to secure in place a Hassan cannula.  Through the [facility] cannula, dioxide gas was insufflated into the peritoneal cavity up to a maximal intra-abdominal pressure of 15 mmHg.  Under direct vision a right upper quadrant 5 mm cannula and a left lower quadrant 5 mm cannula passed into the peritoneal cavity.  The patient was placed in Trendelenburg position and the left side was taken down.  We could see the tip of the appendix on the right paracolic area and it was partly strapped down by the terminal ileum which came across the base of the cecum and the appendix.  Peritoneal attachments of the terminal ileum  were taken down using a harmonic scalpel.  This allowed [place] to mobilize the appendix more medially and get the mesoappendix with the Harmonic scalpel skeletonized to get down to the base of the cecum.  At that point we used a blue cartridge Endo GIA to come across the appendix freeing it up from the cecum.  We inspected the base of the cecum for bleeding and there was minimal bleeding or blood loss.  We irrigated with saline solution, and placed the patient back in the neutral position.  We aspirated all fluid and gas from the peritoneal cavity then removed all cannulas.  The umbilical site incision was closed using a fascial suture which was in place and reinforced with an 0 Vicryl suture.  All needle counts, sponge counts, and instrument counts were correct.  The infraumbilical skin site was closed using a running subcuticular stitch of 4-0 Monocryl.  There was a single stitch placed in the right upper quadrant 5 mm site also.  0.25% Marcaine with epinephrine was injected all sites.  PATIENT DISPOSITION:  PACU - hemodynamically stable.   Jimmye Norman 12/17/20186:06 PM

## 2017-09-14 NOTE — Progress Notes (Signed)
Surgical pcr and chg wipe performed at bedside. Dentures, watch and cell phone secured. Watch and phone in bedside table top drawer. Upper and lower dentures at sink in denture cup.

## 2017-09-14 NOTE — Progress Notes (Signed)
Consent from sign by pt in chart.

## 2017-09-14 NOTE — Anesthesia Procedure Notes (Signed)
Procedure Name: Intubation Date/Time: 09/14/2017 5:10 PM Performed by: Elliot Dally, CRNA Pre-anesthesia Checklist: Patient identified, Emergency Drugs available, Suction available and Patient being monitored Patient Re-evaluated:Patient Re-evaluated prior to induction Oxygen Delivery Method: Circle System Utilized Preoxygenation: Pre-oxygenation with 100% oxygen Induction Type: IV induction Ventilation: Mask ventilation without difficulty Laryngoscope Size: Miller and 2 Grade View: Grade I Tube type: Oral Tube size: 7.5 mm Number of attempts: 1 Airway Equipment and Method: Stylet and Oral airway Placement Confirmation: ETT inserted through vocal cords under direct vision,  positive ETCO2 and breath sounds checked- equal and bilateral Secured at: 22 cm Tube secured with: Tape Dental Injury: Teeth and Oropharynx as per pre-operative assessment

## 2017-09-14 NOTE — Progress Notes (Signed)
Received pt from [facility] center, vitals are stable, connected to tele monitor, oriented to unit.

## 2017-09-14 NOTE — Transfer of Care (Signed)
Immediate Anesthesia Transfer of Care Note  Patient: [name]  Procedure(s) Performed: APPENDECTOMY LAPAROSCOPIC (N/A )  Patient Location: PACU  Anesthesia Type:General  Level of Consciousness: awake, alert  and oriented  Airway & Oxygen Therapy: Patient Spontanous Breathing and Patient connected to nasal cannula oxygen  Post-op Assessment: Report given to RN and Post -op Vital signs reviewed and stable  Post vital signs: Reviewed and stable  Last Vitals:  Vitals:   09/14/17 1321 09/14/17 1830  BP: 134/79 136/67  Pulse: (!) 59 (!) 59  Resp:  19  Temp:  (!) 36.4 C  SpO2: 100% 97%    Last Pain:  Vitals:   09/14/17 1030  TempSrc:   PainSc: 5       Patients Stated Pain Goal: 2 (09/14/17 1030)  Complications: No apparent anesthesia complications

## 2017-09-14 NOTE — ED Notes (Signed)
Pt made aware that we need a urine sample. Will notify ED when able to give one.

## 2017-09-14 NOTE — Progress Notes (Signed)
VS after arrival to floor from PACU

## 2017-09-14 NOTE — ED Provider Notes (Signed)
[facility] Provider Note   CSN: [id number] Arrival date & time: 09/14/17  0105     History   Chief Complaint Chief Complaint  Patient presents with  . Abdominal Pain    HPI [name] is a 71 y.o. male.  The history is provided by the patient.  Abdominal Pain   This is a new problem. The current episode started 3 to 5 hours ago. The problem occurs constantly. The problem has been gradually worsening. The pain is located in the RUQ and RLQ. The pain is moderate. Pertinent negatives include fever, diarrhea, hematochezia, nausea, vomiting, constipation and dysuria. The symptoms are aggravated by palpation.  She reports onset of right upper quadrant and right lower quadrant pain earlier in the night  Denies fever, vomiting, diarrhea He had otherwise been well prior pain onset He reports previous history of right-sided stroke with resultant weakness, which makes pain sensation difficult   Past Medical History:  Diagnosis Date  . Anemia    Multifactorial  - hx of B12 def and iron deficiency, followed by heme[facility]  . Automatic implantable cardioverter-defibrillator in situ   . CAD (coronary artery disease)    a. STEMI s/p CABG 07/2007 (had preop cardiogenic shock, IABP, VDRF before surgery)  . Cardiomyopathy, ischemic    a. Chronic systolic CHF s/p St. Jude dual chamber ICD 03/2008.  . Carotid stenosis    a. Prior carotid dz/ surgery. b. Carotid dopp 07/2011 - 0-39% bilaterally.;   c. Doppler 11/13: 40-59% RICA, 0-39% LICA  . Chronic renal insufficiency   . Diabetes mellitus   . GI bleed    Reported history  . Hx of CABG   . Hypertension    [facility] [facility]   . Mitral regurgitation   . Myocardial infarction (HCC)   . Neuromuscular disorder (HCC)    R sided weakness   . Osteoarthritis   . PAF (paroxysmal atrial fibrillation) (HCC)    a. Poor Coumadin candidate due to hx of GI bleed.  . Sinus bradycardia   . Sleep  apnea    study done, 10 yrs ago, unable to tolerate CPAP  . Splenomegaly   . Stroke [facility](HCC)    a. CVA 1990s - chronic pain in RUE after stroke.  [name]. Systolic CHF, chronic (HCC)    Previously taken off lisinopril by primary doctor due to labs  . Tobacco abuse     Patient Active Problem List   Diagnosis Date Noted  . Dry eye 02/20/2016  . CHF (congestive heart failure) (HCC)   . Elevated LFTs 02/13/2016  . Hypokalemia 02/13/2016  . Chronic combined systolic and diastolic CHF (congestive heart failure) (HCC) 02/13/2016  . Prolonged Q-T interval on ECG 02/13/2016  . Biliary colic 02/13/2016  . Left ear pain 02/13/2016  . Thrombocytopenia (HCC) 02/13/2016  . Right knee pain 10/13/2013  . Abnormality of gait 10/13/2013  . Stiffness of right knee 10/13/2013  . Arthritis of knee, right 08/16/2013  . Cholecystitis with cholelithiasis 03/07/2013  . Automatic implantable cardioverter-defibrillator in situ 03/13/2009  . DM (diabetes mellitus), secondary, with peripheral vascular complications (HCC) 03/12/2009  . Iron deficiency anemia 03/12/2009  . Essential hypertension 03/12/2009  . CARDIOMYOPATHY, ISCHEMIC 03/12/2009  . CEREBROVASCULAR DISEASE 03/12/2009  . PVD 03/12/2009  . RENAL INSUFFICIENCY, CHRONIC 03/12/2009  . OSTEOARTHRITIS 03/12/2009  . CORONARY ARTERY BYPASS GRAFT, HX OF 03/12/2009    Past Surgical History:  Procedure Laterality Date  . CARDIAC DEFIBRILLATOR PLACEMENT  04/05/2008   Implantation of a St. Jude dual-chamber defibrillator, Doylene Canning. Ladona Ridgel , MD  . CARDIAC DEFIBRILLATOR PLACEMENT    . CHOLECYSTECTOMY N/A 02/19/2016   Procedure: LAPAROSCOPIC CHOLECYSTECTOMY;  Surgeon: Abigail Miyamoto, MD;  Location: [facility] OR;  Service: General;  Laterality: N/A;  . COLONOSCOPY  07/2007   Rourk: left-sided diverticulum. TI normal. No etiology for IDA.  [name] CORONARY ARTERY BYPASS GRAFT    . CORONARY ARTERY BYPASS GRAFT  08/29/2007   x3; Marilu Favre H. Cornelius Moras MD  . EP IMPLANTABLE DEVICE  N/A 10/05/2015   Procedure: ICD Generator Changeout;  Surgeon: Marinus Maw, MD;  Location: [facility] INVASIVE CV LAB;  Service: Cardiovascular;  Laterality: N/A;  . ERCP N/A 02/18/2016   Procedure: ENDOSCOPIC RETROGRADE CHOLANGIOPANCREATOGRAPHY (ERCP);  Surgeon: Vida Rigger, MD;  Location: [facility] ENDOSCOPY;  Service: Endoscopy;  Laterality: N/A;  . ESOPHAGOGASTRODUODENOSCOPY  07/2007   Rourk: small hh, atrophic gastric mucosa, poor distensibility of stomach ?extrinsic compression, CT showed mild to moderate splenomegaly. No etiology for IDA.  [name] JOINT REPLACEMENT  1990's   L knee  . Percutaneous coronary intervention  01/10/2005   Charlies Constable, MD  . TONSILLECTOMY    . TOTAL KNEE ARTHROPLASTY Right 08/16/2013   Procedure: RIGHT TOTAL KNEE ARTHROPLASTY;  Surgeon: Kathryne Hitch, MD;  Location: [facility] OR;  Service: Orthopedics;  Laterality: Right;       Home Medications    Prior to Admission medications   Medication Sig Start Date End Date Taking? Authorizing Provider  aspirin EC 81 MG EC tablet Take 1 tablet (81 mg total) by mouth daily. 07/29/12   Arguello, Roger A, PA-C  atorvastatin (LIPITOR) 80 MG tablet Take 0.5 tablets (40 mg total) by mouth every other day. 07/20/14   Marinus Maw, MD  baclofen (LIORESAL) 20 MG tablet Take 20 mg by mouth 2 (two) times daily.     [provider]  calcium carbonate (TUMS - DOSED IN MG ELEMENTAL CALCIUM) 500 MG chewable tablet Chew 1 tablet by mouth 3 (three) times daily as needed for indigestion or heartburn.  02/14/14   [provider]  Canagliflozin (INVOKANA) 300 MG TABS Take 1 tablet by mouth daily.    [provider]  carvedilol (COREG) 12.5 MG tablet Take 1 tablet (12.5 mg total) by mouth 2 (two) times daily with a meal. 11/03/14   Laqueta Linden, MD  clopidogrel (PLAVIX) 75 MG tablet Take 1 tablet (75 mg total) by mouth daily. 01/21/17   Marinus Maw, MD  furosemide (LASIX) 40 MG tablet Take 40 mg by mouth daily as  needed for fluid.     [provider]  glimepiride (AMARYL) 4 MG tablet Take 4 mg by mouth 2 (two) times daily.     [provider]  linagliptin (TRADJENTA) 5 MG TABS tablet Take 5 mg by mouth daily after breakfast.     [provider]  meclizine (ANTIVERT) 25 MG tablet Take 25 mg by mouth 4 (four) times daily as needed. For dizziness 02/26/12   [provider]  metoCLOPramide (REGLAN) 10 MG tablet Take 1 tablet (10 mg total) by mouth 3 (three) times daily before meals. 02/22/16   Rolly Salter, MD  nitroGLYCERIN (NITROSTAT) 0.4 MG SL tablet Place 1 tablet (0.4 mg total) under the tongue every 5 (five) minutes as needed for chest pain. 01/02/16   Marinus Maw, MD  oxyCODONE-acetaminophen (PERCOCET) 10-325 MG per tablet Take 1 tablet by mouth every 4 (four) hours as needed  for pain.  01/26/14   [provider]  polyethylene glycol (MIRALAX / GLYCOLAX) packet Take 17 g by mouth daily. 02/22/16   Rolly Salter, MD  RANITIDINE HCL PO Take 1 tablet by mouth daily as needed (heartburn or indigestion).  12/17/13   [provider]  SENNA CO Take 1 tablet by mouth daily as needed (constipation).  01/10/14   [provider]  spironolactone (ALDACTONE) 25 MG tablet Take 1 tablet (25 mg total) by mouth every morning. 07/20/14   Marinus Maw, MD    Family History Family History  Problem Relation Age of Onset  . Heart attack Mother        CVA, MI  . Diabetes Mother   . Heart attack Father        MI  . Diabetes Sister   . Coronary artery disease Brother        CABG    Social History Social History   Tobacco Use  . Smoking status: Former Smoker    Packs/day: 1.00    Years: 15.00    Pack years: 15.00    Last attempt to quit: 09/29/1990    Years since quitting: 26.9  . Smokeless tobacco: Former Neurosurgeon    Quit date: 08/09/1988  Substance Use Topics  . Alcohol use: No  . Drug use: No     Allergies   Codeine   Review of  Systems Review of Systems  Constitutional: Negative for fever.  Respiratory: Negative for shortness of breath.   Cardiovascular: Negative for chest pain.  Gastrointestinal: Positive for abdominal pain. Negative for constipation, diarrhea, hematochezia, nausea and vomiting.  Genitourinary: Negative for dysuria and testicular pain.  All other systems reviewed and are negative.    Physical Exam Updated Vital Signs BP (!) 165/76 (BP Location: Left Arm)   Pulse 64   Temp (!) 97.4 F (36.3 C) (Oral)   Resp 18   Ht 1.854 m (6\' 1" )   Wt 97.1 kg (214 lb)   SpO2 99%   BMI 28.23 kg/m   Physical Exam CONSTITUTIONAL: Elderly, no acute distress HEAD: Normocephalic/atraumatic EYES: EOMI/PERRL no icterus ENMT: Mucous membranes moist NECK: supple no meningeal signs SPINE/BACK:entire spine nontender CV: Murmur noted LUNGS: Lungs are clear to auscultation bilaterally, no apparent distress ABDOMEN: soft, mild RUQ/mild RLQ tenderness, no rebound or guarding, bowel sounds noted throughout abdomen GU:no cva tenderness NEURO: Pt is awake/alert/appropriate, right-sided paresis noted -chronic EXTREMITIES: pulses normal/equal, full ROM SKIN: warm, color normal PSYCH: no abnormalities of mood noted, alert and oriented to situation   ED Treatments / Results  Labs (all labs ordered are listed, but only abnormal results are displayed) Labs Reviewed  COMPREHENSIVE METABOLIC PANEL - Abnormal; Notable for the following components:      Result Value   Glucose, Bld 215 (*)    ALT 10 (*)    All other components within normal limits  CBC WITH DIFFERENTIAL/PLATELET - Abnormal; Notable for the following components:   Hemoglobin 12.6 (*)    All other components within normal limits  URINALYSIS, ROUTINE W REFLEX MICROSCOPIC - Abnormal; Notable for the following components:   Color, Urine STRAW (*)    Glucose, UA >=500 (*)    Squamous Epithelial / LPF 0-5 (*)    All other components within normal  limits  LIPASE, BLOOD    EKG  EKG Interpretation  Date/Time:  Monday September 14 2017 01:43:42 EST Ventricular Rate:  60 PR Interval:    QRS Duration: 110 QT  Interval:  465 QTC Calculation: 469 R Axis:   150 Text Interpretation:  Atrial-paced complexes Prolonged PR interval Inferior infarct, old Anterolateral infarct, age indeterminate No significant change since last tracing Confirmed by Zadie Rhine ([id number]) on 09/14/2017 1:47:15 AM       Radiology Ct Abdomen Pelvis W Contrast  Result Date: 09/14/2017 CLINICAL DATA:  Right-sided abdominal pain, onset yesterday. EXAM: CT ABDOMEN AND PELVIS WITH CONTRAST TECHNIQUE: Multidetector CT imaging of the abdomen and pelvis was performed using the standard protocol following bolus administration of intravenous contrast. CONTRAST:  ISOVUE-300 IOPAMIDOL (ISOVUE-300) INJECTION 61% COMPARISON:  CT abdomen 02/15/2016, CT abdomen/ pelvis 03/06/2013 FINDINGS: Lower chest: Mild left basilar atelectasis.  No pleural fluid. Hepatobiliary: No focal hepatic lesion. There is an delayed heterogeneous soft tissue density measuring 2.5 x 2.3 cm adjacent to the right hepatic lobe abutting the intercostal muscles. No biliary dilatation. Post cholecystectomy. Pancreas: Fatty atrophy.  No ductal dilatation or inflammation. Spleen: Normal in size without focal abnormality. Adrenals/Urinary Tract: Normal adrenal glands. No hydronephrosis or perinephric edema. Homogeneous renal enhancement with symmetric excretion on delayed phase imaging. Urinary bladder is minimally distended without wall thickening. Stomach/Bowel: Mild appendiceal dilatation measuring 10 mm with intraluminal low-density (low-density appreciated on image 47 series 2). Mild appendiceal wall enhancement. Vague hyperdensity in the proximal appendix may be appendicolith. There is minimal periappendiceal inflammation. Appendix: Location: Retrocecal. Diameter: 10 mm Appendicolith: Probably. Mucosal  hyper-enhancement: Yes. Extraluminal gas: [name] Periappendiceal collection: [name] Stomach is nondistended. No small bowel inflammation or obstruction. No bowel wall thickening. Small volume of colonic stool. Sigmoid colon is tortuous. Vascular/Lymphatic: Moderate aortic atherosclerosis. No enlarged abdominal or pelvic lymph nodes. Reproductive: Prostate is unremarkable. Other: No free air, free fluid, or intra-abdominal fluid collection. Minimal fat in the left inguinal canal. Musculoskeletal: Degenerative change in the lower lumbar spine. There are no acute or suspicious osseous abnormalities. IMPRESSION: 1. Findings suspicious for early acute appendicitis. Mild appendiceal dilatation containing intra low density, with mucosal enhancement, possible appendicolith and minimal periappendiceal inflammation. The intraluminal low-density raises the possibility of appendiceal mucocele. No perforation. 2. Ovoid 2.5 cm heterogeneous intrahepatic soft tissue density adjacent of the right lobe of the liver in the region of the intercostal muscles, likely sequela of interval cholecystectomy. A dropped gallstone could produce this appearance. 3.  Aortic Atherosclerosis (ICD10-I70.0). Electronically Signed   By: Rubye Oaks M.D.   On: 09/14/2017 04:28    Procedures Procedures (including critical care time)  Medications Ordered in ED Medications  piperacillin-tazobactam (ZOSYN) IVPB 3.375 g (not administered)  iopamidol (ISOVUE-300) 61 % injection 100 mL (100 mLs Intravenous Contrast Given 09/14/17 0341)     Initial Impression / Assessment and Plan / ED Course  I have reviewed the triage vital signs and the nursing notes.  Pertinent labs & imaging results that were available during my care of the patient were reviewed by me and considered in my medical decision making (see chart for details).     1:53 AM Pt declines pain meds at this time Will likely need CT imaging 4:45 AM CT imaging confirms  uncomplicated acute appendicitis Patient stable, awake and alert, still refusing pain meds Discussed with Dr. Franky Macho with general surgery Request IV Zosyn, and will see later today He requests I  admit to the hospitalist 4:49 AM Discussed with Dr. Sharl Ma for admission Final Clinical Impressions(s) / ED Diagnoses   Final diagnoses:  Acute appendicitis, uncomplicated    ED Discharge Orders    None  Zadie Rhine, MD 09/14/17 [id number]

## 2017-09-14 NOTE — Consult Note (Signed)
Reason for Consult: Early appendicitis Referring Physician: Dr. Merdis Delay is an 71 y.o. male.  HPI: Patient is a 71 year old white male with multiple medical problems including congestive heart failure, coronary artery disease, anemia, status post AICD who presents with a less than 24-hour history of worsening lower abdominal pain.  Patient states that this came on suddenly and he is pain was localized just below the umbilicus.  It also seem to be over to the right side.  He has had a CVA in the past which affected his right side.  He had some nausea, but no vomiting.  He denies any fever or chills.  He presented to the emergency room and a CT scan of the abdomen revealed possible early distal appendicitis with a mildly dilated appendix.  They cannot tell whether there was an appendicolith or mucus buildup in the appendix.  No evidence of perforation was noted.  The patient was admitted to the hospital for further evaluation and treatment.  He states his nausea has resolved.  His pain is a lot less.  He denies any diarrhea.  Past Medical History:  Diagnosis Date  . Anemia    Multifactorial  - hx of B12 def and iron deficiency, [facility]  . Automatic implantable cardioverter-defibrillator in situ   . CAD (coronary artery disease)    a. STEMI s/p CABG 07/2007 (had preop cardiogenic shock, IABP, VDRF before surgery)  . Cardiomyopathy, ischemic    a. Chronic systolic CHF s/p St. Jude dual chamber ICD 03/2008.  . Carotid stenosis    a. Prior carotid dz/ surgery. b. Carotid dopp 07/2011 - 0-39% bilaterally.;   c. Doppler 11/13: 05-69% RICA, 7-94% LICA  . Chronic renal insufficiency   . Diabetes mellitus   . GI bleed    Reported history  . Hx of CABG   . Hypertension    [place] [facility]   . Mitral regurgitation   . Myocardial infarction ([place])   . Neuromuscular disorder ([place])    R sided weakness   . Osteoarthritis   . PAF (paroxysmal atrial  fibrillation) ([place])    a. Poor Coumadin candidate due to hx of GI bleed.  . Sinus bradycardia   . Sleep apnea    study done, 10 yrs ago, unable to tolerate CPAP  . Splenomegaly   . Stroke [facility])    a. CVA 1990s - chronic pain in RUE after stroke.  [name] Systolic CHF, chronic (HCC)    Previously taken off lisinopril by primary doctor due to labs  . Tobacco abuse     Past Surgical History:  Procedure Laterality Date  . CARDIAC DEFIBRILLATOR PLACEMENT  04/05/2008   Implantation of a St. Jude dual-chamber defibrillator, [place] Mungo. [name] , [place]    . CHOLECYSTECTOMY N/A 02/19/2016   Procedure: LAPAROSCOPIC CHOLECYSTECTOMY;  Surgeon: Coralie Keens, MD;  Location: [place];  Service: General;  Laterality: N/A;  . COLONOSCOPY  07/2007   Rourk: left-sided diverticulum. TI normal. No etiology for IDA.  [name] CORONARY ARTERY BYPASS GRAFT    . CORONARY ARTERY BYPASS GRAFT  08/29/2007   x3; Braulio Conte H. Roxy Manns MD  . EP IMPLANTABLE DEVICE N/A 10/05/2015   Procedure: ICD Generator Changeout;  Surgeon: Evans Lance, MD;  Location: [place] [facility];  Service: Cardiovascular;  Laterality: N/A;  . ERCP N/A 02/18/2016   Procedure: ENDOSCOPIC RETROGRADE CHOLANGIOPANCREATOGRAPHY (ERCP);  Surgeon: Clarene Essex, MD;  Location[facility]  ENDOSCOPY;  Service: Endoscopy;  Laterality: N/A;  . ESOPHAGOGASTRODUODENOSCOPY  07/2007   Rourk: small hh, atrophic gastric mucosa, poor distensibility of stomach ?extrinsic compression, CT showed mild to moderate splenomegaly. No etiology for IDA.  [name] JOINT REPLACEMENT  1990's   L knee  . Percutaneous coronary intervention  01/10/2005   Eustace Quail, MD  . TONSILLECTOMY    . TOTAL KNEE ARTHROPLASTY Right 08/16/2013   Procedure: RIGHT TOTAL KNEE ARTHROPLASTY;  Surgeon: Mcarthur Rossetti, MD;  Location: [place];  Service: Orthopedics;  Laterality: Right;    Family History  Problem Relation Age of Onset  . Heart attack Mother        CVA, MI  . Diabetes  Mother   . Heart attack Father        MI  . Diabetes Sister   . Coronary artery disease Brother        CABG    Social History:  reports that he quit smoking about 26 years ago. He has a 15.00 pack-year smoking history. He quit smokeless tobacco use about 29 years ago. He reports that he does not drink alcohol or use drugs.  Allergies:  Allergies  Allergen Reactions  . Codeine Nausea And Vomiting    Medications: I have reviewed the patient's current medications.  Results for orders placed or performed during the hospital encounter of 09/14/17 (from the past 48 hour(s))  Urinalysis, Routine w reflex microscopic     Status: Abnormal   Collection Time: 09/14/17  2:45 AM  Result Value Ref Range   Color, Urine STRAW (A) YELLOW   APPearance CLEAR CLEAR   Specific Gravity, Urine 1.009 1.005 - 1.030   pH 5.0 5.0 - 8.0   Glucose, UA >=500 (A) NEGATIVE mg/dL   Hgb urine dipstick NEGATIVE NEGATIVE   Bilirubin Urine NEGATIVE NEGATIVE   Ketones, ur NEGATIVE NEGATIVE mg/dL   Protein, ur NEGATIVE NEGATIVE mg/dL   Nitrite NEGATIVE NEGATIVE   Leukocytes, UA NEGATIVE NEGATIVE   RBC / HPF 0-5 0 - 5 RBC/hpf   WBC, UA 0-5 0 - 5 WBC/hpf   Bacteria, UA NONE SEEN NONE SEEN   Squamous Epithelial / LPF 0-5 (A) NONE SEEN  Comprehensive metabolic panel     Status: Abnormal   Collection Time: 09/14/17  2:53 AM  Result Value Ref Range   Sodium 139 135 - 145 mmol/L   Potassium 4.2 3.5 - 5.1 mmol/L   Chloride 105 101 - 111 mmol/L   CO2 26 22 - 32 mmol/L   Glucose, Bld 215 (H) 65 - 99 mg/dL   BUN 17 6 - 20 mg/dL   Creatinine, Ser 0.97 0.61 - 1.24 mg/dL   Calcium 9.0 8.9 - 10.3 mg/dL   Total Protein 6.6 6.5 - 8.1 g/dL   Albumin 3.9 3.5 - 5.0 g/dL   AST 18 15 - 41 U/L   ALT 10 (L) 17 - 63 U/L   Alkaline Phosphatase 74 38 - 126 U/L   Total Bilirubin 0.6 0.3 - 1.2 mg/dL   GFR calc non Af Amer >60 >60 mL/min   GFR calc Af Amer >60 >60 mL/min    Comment: (NOTE) The eGFR has been calculated using the  CKD EPI equation. This calculation has not been validated in all clinical situations. eGFR's persistently <60 mL/min signify possible Chronic Kidney Disease.    Anion gap 8 5 - 15  CBC with Differential/Platelet     Status: Abnormal   Collection Time: 09/14/17  2:53 AM  Result Value Ref Range   WBC 10.2 4.0 - 10.5 K/uL   RBC 4.45 4.22 - 5.81 MIL/uL   Hemoglobin 12.6 (L) 13.0 - 17.0 g/dL   HCT 39.5 39.0 - 52.0 %   MCV 88.8 78.0 - 100.0 fL   MCH 28.3 26.0 - 34.0 pg   MCHC 31.9 30.0 - 36.0 g/dL   RDW 14.4 11.5 - 15.5 %   Platelets 183 150 - 400 K/uL   Neutrophils Relative % 77 %   Neutro Abs 7.7 1.7 - 7.7 K/uL   Lymphocytes Relative 12 %   Lymphs Abs 1.3 0.7 - 4.0 K/uL   Monocytes Relative 10 %   Monocytes Absolute 1.0 0.1 - 1.0 K/uL   Eosinophils Relative 1 %   Eosinophils Absolute 0.1 0.0 - 0.7 K/uL   Basophils Relative 0 %   Basophils Absolute 0.0 0.0 - 0.1 K/uL  Lipase, blood     Status: None   Collection Time: 09/14/17  2:53 AM  Result Value Ref Range   Lipase 20 11 - 51 U/L    Ct Abdomen Pelvis W Contrast  Result Date: 09/14/2017 CLINICAL DATA:  Right-sided abdominal pain, onset yesterday. EXAM: CT ABDOMEN AND PELVIS WITH CONTRAST TECHNIQUE: Multidetector CT imaging of the abdomen and pelvis was performed using the standard protocol following bolus administration of intravenous contrast. CONTRAST:  178m ISOVUE-300 IOPAMIDOL (ISOVUE-300) INJECTION 61% COMPARISON:  CT abdomen 02/15/2016, CT abdomen/ pelvis 03/06/2013 FINDINGS: Lower chest: Mild left basilar atelectasis.  No pleural fluid. Hepatobiliary: No focal hepatic lesion. There is an delayed heterogeneous soft tissue density measuring 2.5 x 2.3 cm adjacent to the right hepatic lobe abutting the intercostal muscles. No biliary dilatation. Post cholecystectomy. Pancreas: Fatty atrophy.  No ductal dilatation or inflammation. Spleen: Normal in size without focal abnormality. Adrenals/Urinary Tract: Normal adrenal glands. No  hydronephrosis or perinephric edema. Homogeneous renal enhancement with symmetric excretion on delayed phase imaging. Urinary bladder is minimally distended without wall thickening. Stomach/Bowel: Mild appendiceal dilatation measuring 10 mm with intraluminal low-density (low-density appreciated on image 47 series 2). Mild appendiceal wall enhancement. Vague hyperdensity in the proximal appendix may be appendicolith. There is minimal periappendiceal inflammation. Appendix: Location: Retrocecal. Diameter: 10 mm Appendicolith: Probably. Mucosal hyper-enhancement: Yes. Extraluminal gas: [name] Periappendiceal collection: [name] Stomach is nondistended. No small bowel inflammation or obstruction. No bowel wall thickening. Small volume of colonic stool. Sigmoid colon is tortuous. Vascular/Lymphatic: Moderate aortic atherosclerosis. No enlarged abdominal or pelvic lymph nodes. Reproductive: Prostate is unremarkable. Other: No free air, free fluid, or intra-abdominal fluid collection. Minimal fat in the left inguinal canal. Musculoskeletal: Degenerative change in the lower lumbar spine. There are no acute or suspicious osseous abnormalities. IMPRESSION: 1. Findings suspicious for early acute appendicitis. Mild appendiceal dilatation containing intra low density, with mucosal enhancement, possible appendicolith and minimal periappendiceal inflammation. The intraluminal low-density raises the possibility of appendiceal mucocele. No perforation. 2. Ovoid 2.5 cm heterogeneous intrahepatic soft tissue density adjacent of the right lobe of the liver in the region of the intercostal muscles, likely sequela of interval cholecystectomy. A dropped gallstone could produce this appearance. 3.  Aortic Atherosclerosis (ICD10-I70.0). Electronically Signed   By: MJeb LeveringM.D.   On: 09/14/2017 04:28    ROS:  Pertinent items are noted in HPI.  Blood pressure 138/73, pulse 61, temperature 97.8 F (36.6 C), temperature source Oral,  resp. rate (!) 21, height 6' 1"  (1.854 m), weight 208 lb 12.8 oz (94.7 kg), SpO2 100 %. Physical Exam: Pleasant well-developed well-nourished white  male no acute distress Head is normocephalic, atraumatic Neck reveals no carotid bruits with a palpable left carotid pulse.  It was difficult to palpate the right carotid. Lungs clear to auscultation with equal breath sounds bilaterally Heart examination reveals a 3 out of 6 systolic ejection murmur.  No S3, S4 appreciable.  AICD in place. Abdomen is soft with intermittent discomfort to palpation deep in the right lower quadrant region just below the umbilicus.  No right flank pain noted.  No distention noted.  No rigidity noted.  CT scan images reviewed  Assessment/Plan: Impression: Possible early appendicitis, coronary artery disease, status post CVA, history of congestive heart failure, Ejection fraction last noted 25-30% Plan: We will get urgent cardiology consult.  I have already spoken with them.  For right now, continue IV Zosyn.  He is at increased surgical risk due to his multiple medical problems.  It has been reported in the literature that early appendicitis could be treated with antibiotics, should the patient improve did I did discuss this with the patient.  We will continue to monitor.  Aviva Signs 09/14/2017, 7:43 AM

## 2017-09-14 NOTE — Progress Notes (Signed)
PROGRESS NOTE    [name]  VEL:[id number] DOB: 1946/04/02 DOA: 09/14/2017 PCP: [name], DO    Brief Narrative:  71 year old male with a history of cardiomyopathy, ejection fraction of 25-30% status post AICD, CAD status post CABG, previous history of stroke, diabetes, hypertension, presents to the hospital with right lower quadrant abdominal pain.  CT imaging of his abdomen indicated possible early acute appendicitis.  He has been started on intravenous antibiotics.  Seen by general surgery.  After discussion with anesthesia, it was felt that patient would be high risk for surgery at this facility due to his multiple comorbidities.  Recommendations are to transfer to [facility] for further management.   Assessment & Plan:   Principal Problem:   Acute appendicitis, uncomplicated Active Problems:   DM (diabetes mellitus), secondary, with peripheral vascular complications (HCC)   Essential hypertension   CEREBROVASCULAR DISEASE   Automatic implantable cardioverter-defibrillator in situ   CORONARY ARTERY BYPASS GRAFT, HX OF   Chronic combined systolic and diastolic CHF (congestive heart failure) (HCC)   1. Early acute appendicitis.  Seen by general surgery.  Currently on intravenous antibiotics.  Due to his complex medical history, recommendations from anesthesia and general surgery are to transfer to [facility] for further management.  Patient is currently n.p.o. 2. Chronic combined CHF.  Ejection fraction of 25-30%.  Status post AICD.  Currently appears compensated.  Continue coreg. Continue to monitor. 3. Previous history of stroke with residual right-sided hemiparesis.  Stable 4. Diabetes.  Oral agents currently on hold.  On sliding scale insulin.  Blood sugar stable. 5. Hypertension.  Blood pressure currently stable.  Continue on beta-blockers. 6. Coronary artery disease status post CABG.  No complaints of chest pain.  Aspirin Plavix currently on hold  due to possible surgery.  Continue on beta-blockers.   DVT prophylaxis: lovenox Code Status: full code Family Communication: No family present Disposition Plan: transfer to [facility] for further management. Discussed with general surgery who will see him on transfer. Discussed with Dr. Caleb Popp who accepted patient.   Consultants:   General Surgery  Procedures:     Antimicrobials:  Zosyn 12/17>    Subjective: Abdominal pain is doing better, although not completely resolved. No nausea or vomiting.  Objective: Vitals:   09/14/17 0500 09/14/17 0542 09/14/17 0600 09/14/17 0626  BP: 137/72 130/72 113/60 138/73  Pulse: 60  60 61  Resp: 14 16 (!) 21 (!) 21  Temp:    97.8 F (36.6 C)  TempSrc:    Oral  SpO2: 99%  97% 100%  Weight:    94.7 kg (208 lb 12.8 oz)  Height:    6\' 1"  (1.854 m)    Intake/Output Summary (Last 24 hours) at 09/14/2017 0954 Last data filed at 09/14/2017 0519 Gross per 24 hour  Intake 50 ml  Output -  Net 50 ml   Filed Weights   09/14/17 0121 09/14/17 0626  Weight: 97.1 kg (214 lb) 94.7 kg (208 lb 12.8 oz)    Examination:  General exam: Appears calm and comfortable  Respiratory system: Clear to auscultation. Respiratory effort normal. Cardiovascular system: S1 & S2 heard, RRR. No JVD, murmurs, rubs, gallops or clicks. 1+ pedal edema. Gastrointestinal system: Abdomen is nondistended, soft and mild tenderness in RLQ. No organomegaly or masses felt. Normal bowel sounds heard. Central nervous system: Alert and oriented. No focal neurological deficits. Extremities: Symmetric 5 x 5 power. Skin: No rashes, lesions or ulcers Psychiatry: Judgement and insight appear normal.  Mood & affect appropriate.     Data Reviewed: I have personally reviewed following labs and imaging studies  CBC: Recent Labs  Lab 09/14/17 0253  WBC 10.2  NEUTROABS 7.7  HGB 12.6*  HCT 39.5  MCV 88.8  PLT 183   Basic Metabolic Panel: Recent Labs  Lab 09/14/17 0253  NA  139  K 4.2  CL 105  CO2 26  GLUCOSE 215*  BUN 17  CREATININE 0.97  CALCIUM 9.0   GFR: Estimated Creatinine Clearance: 80.1 mL/min (by C-G formula based on SCr of 0.97 mg/dL). Liver Function Tests: Recent Labs  Lab 09/14/17 0253  AST 18  ALT 10*  ALKPHOS 74  BILITOT 0.6  PROT 6.6  ALBUMIN 3.9   Recent Labs  Lab 09/14/17 0253  LIPASE 20   No results for input(s): AMMONIA in the last 168 hours. Coagulation Profile: No results for input(s): INR, PROTIME in the last 168 hours. Cardiac Enzymes: No results for input(s): CKTOTAL, CKMB, CKMBINDEX, TROPONINI in the last 168 hours. BNP (last 3 results) No results for input(s): PROBNP in the last 8760 hours. HbA1C: No results for input(s): HGBA1C in the last 72 hours. CBG: Recent Labs  Lab 09/14/17 0746  GLUCAP 110*   Lipid Profile: No results for input(s): CHOL, HDL, LDLCALC, TRIG, CHOLHDL, LDLDIRECT in the last 72 hours. Thyroid Function Tests: No results for input(s): TSH, T4TOTAL, FREET4, T3FREE, THYROIDAB in the last 72 hours. Anemia Panel: No results for input(s): VITAMINB12, FOLATE, FERRITIN, TIBC, IRON, RETICCTPCT in the last 72 hours. Sepsis Labs: No results for input(s): PROCALCITON, LATICACIDVEN in the last 168 hours.  No results found for this or any previous visit (from the past 240 hour(s)).       Radiology Studies: Ct Abdomen Pelvis W Contrast  Result Date: 09/14/2017 CLINICAL DATA:  Right-sided abdominal pain, onset yesterday. EXAM: CT ABDOMEN AND PELVIS WITH CONTRAST TECHNIQUE: Multidetector CT imaging of the abdomen and pelvis was performed using the standard protocol following bolus administration of intravenous contrast. CONTRAST:  100mL ISOVUE-300 IOPAMIDOL (ISOVUE-300) INJECTION 61% COMPARISON:  CT abdomen 02/15/2016, CT abdomen/ pelvis 03/06/2013 FINDINGS: Lower chest: Mild left basilar atelectasis.  No pleural fluid. Hepatobiliary: No focal hepatic lesion. There is an delayed heterogeneous soft  tissue density measuring 2.5 x 2.3 cm adjacent to the right hepatic lobe abutting the intercostal muscles. No biliary dilatation. Post cholecystectomy. Pancreas: Fatty atrophy.  No ductal dilatation or inflammation. Spleen: Normal in size without focal abnormality. Adrenals/Urinary Tract: Normal adrenal glands. No hydronephrosis or perinephric edema. Homogeneous renal enhancement with symmetric excretion on delayed phase imaging. Urinary bladder is minimally distended without wall thickening. Stomach/Bowel: Mild appendiceal dilatation measuring 10 mm with intraluminal low-density (low-density appreciated on image 47 series 2). Mild appendiceal wall enhancement. Vague hyperdensity in the proximal appendix may be appendicolith. There is minimal periappendiceal inflammation. Appendix: Location: Retrocecal. Diameter: 10 mm Appendicolith: Probably. Mucosal hyper-enhancement: Yes. Extraluminal gas: [name] Periappendiceal collection: [name] Stomach is nondistended. No small bowel inflammation or obstruction. No bowel wall thickening. Small volume of colonic stool. Sigmoid colon is tortuous. Vascular/Lymphatic: Moderate aortic atherosclerosis. No enlarged abdominal or pelvic lymph nodes. Reproductive: Prostate is unremarkable. Other: No free air, free fluid, or intra-abdominal fluid collection. Minimal fat in the left inguinal canal. Musculoskeletal: Degenerative change in the lower lumbar spine. There are no acute or suspicious osseous abnormalities. IMPRESSION: 1. Findings suspicious for early acute appendicitis. Mild appendiceal dilatation containing intra low density, with mucosal enhancement, possible appendicolith and minimal periappendiceal inflammation. The intraluminal low-density  raises the possibility of appendiceal mucocele. No perforation. 2. Ovoid 2.5 cm heterogeneous intrahepatic soft tissue density adjacent of the right lobe of the liver in the region of the intercostal muscles, likely sequela of interval  cholecystectomy. A dropped gallstone could produce this appearance. 3.  Aortic Atherosclerosis (ICD10-I70.0). Electronically Signed   By: Rubye Oaks M.D.   On: 09/14/2017 04:28        Scheduled Meds: . carvedilol  12.5 mg Oral BID WC  . enoxaparin (LOVENOX) injection  40 mg Subcutaneous Q24H  . insulin aspart  0-9 Units Subcutaneous TID WC   Continuous Infusions: . sodium chloride 100 mL/hr at 09/14/17 0700  . piperacillin-tazobactam (ZOSYN)  IV       LOS: 0 days    Time spent:    Erick Blinks, MD [facility] Pager [phone]  If 7PM-7AM, please contact night-coverage [email or web address] Password [email or web address] 09/14/2017, 9:54 AM

## 2017-09-15 ENCOUNTER — Encounter (HOSPITAL_COMMUNITY): Payer: Self-pay | Admitting: General Surgery

## 2017-09-15 DIAGNOSIS — K3580 Unspecified acute appendicitis: Secondary | ICD-10-CM | POA: Diagnosis not present

## 2017-09-15 LAB — COMPREHENSIVE METABOLIC PANEL
ALK PHOS: 75 U/L (ref 38–126)
ALT: 11 U/L — AB (ref 17–63)
AST: 15 U/L (ref 15–41)
Albumin: 3.3 g/dL — ABNORMAL LOW (ref 3.5–5.0)
Anion gap: 11 (ref 5–15)
BILIRUBIN TOTAL: 0.8 mg/dL (ref 0.3–1.2)
BUN: 16 mg/dL (ref 6–20)
CALCIUM: 8.6 mg/dL — AB (ref 8.9–10.3)
CO2: 22 mmol/L (ref 22–32)
CREATININE: 1.03 mg/dL (ref 0.61–1.24)
Chloride: 105 mmol/L (ref 101–111)
Glucose, Bld: 202 mg/dL — ABNORMAL HIGH (ref 65–99)
Potassium: 4.4 mmol/L (ref 3.5–5.1)
Sodium: 138 mmol/L (ref 135–145)
TOTAL PROTEIN: 5.9 g/dL — AB (ref 6.5–8.1)

## 2017-09-15 LAB — HEMOGLOBIN A1C
HEMOGLOBIN A1C: 7.8 % — AB (ref 4.8–5.6)
MEAN PLASMA GLUCOSE: 177 mg/dL

## 2017-09-15 LAB — CBC
HCT: 42.2 % (ref 39.0–52.0)
HEMOGLOBIN: 13.8 g/dL (ref 13.0–17.0)
MCH: 28.5 pg (ref 26.0–34.0)
MCHC: 32.7 g/dL (ref 30.0–36.0)
MCV: 87 fL (ref 78.0–100.0)
Platelets: 204 10*3/uL (ref 150–400)
RBC: 4.85 MIL/uL (ref 4.22–5.81)
RDW: 14.2 % (ref 11.5–15.5)
WBC: 6.9 10*3/uL (ref 4.0–10.5)

## 2017-09-15 LAB — GLUCOSE, CAPILLARY: Glucose-Capillary: 173 mg/dL — ABNORMAL HIGH (ref 65–99)

## 2017-09-15 MED ORDER — ASPIRIN 81 MG PO TBEC: 81.0000 mg | DELAYED_RELEASE_TABLET | Freq: Every day | ORAL | 12 refills | Status: DC

## 2017-09-15 MED ORDER — CLOPIDOGREL BISULFATE 75 MG PO TABS: 75.0000 mg | ORAL_TABLET | Freq: Every day | ORAL | 3 refills | Status: DC

## 2017-09-15 NOTE — Plan of Care (Signed)
  Education: Knowledge of General Education information will improve 09/15/2017 0334 - Progressing by Olena Mater, RN Note POC reviewed with pt.

## 2017-09-15 NOTE — Progress Notes (Signed)
[facility] Progress Note  1 Day Post-Op  Subjective: CC: abdominal soreness Mild abdominal soreness that is worse with movement. Tolerating CLD, passing flatus. Denies n/v. Wants to know when he can go home.  UOP good. VSS.   Objective: Vital signs in last 24 hours: Temp:  [97.4 F (36.3 C)-98.2 F (36.8 C)] 97.4 F (36.3 C) (12/18 0743) Pulse Rate:  [58-99] 58 (12/18 0743) Resp:  [16-19] 18 (12/18 0743) BP: (109-136)/(54-79) 113/62 (12/18 0743) SpO2:  [95 %-100 %] 100 % (12/18 0743) Weight:  [93.2 kg (205 lb 8 oz)-94 kg (207 lb 4 oz)] 93.2 kg (205 lb 8 oz) (12/18 0743) Last BM Date: 09/13/17  Intake/Output from previous day: 12/17 0701 - 12/18 0700 In: 820 [I.V.:800] Out: 2195 [Urine:2175; Blood:20] Intake/Output this shift: No intake/output data recorded.  PE: Gen:  Alert, NAD, pleasant Card:  Regular rate and rhythm, pedal pulses 2+ BL Pulm:  Normal effort, clear to auscultation bilaterally Abd: Soft, appropriately tender, very mildly distended, bowel sounds present, no HSM, incisions C/D/I Skin: warm and dry, no rashes  Psych: A&Ox3   Lab Results:  Recent Labs    09/14/17 0253 09/15/17 0606  WBC 10.2 6.9  HGB 12.6* 13.8  HCT 39.5 42.2  PLT 183 204   BMET Recent Labs    09/14/17 0253 09/15/17 0606  NA 139 138  K 4.2 4.4  CL 105 105  CO2 26 22  GLUCOSE 215* 202*  BUN 17 16  CREATININE 0.97 1.03  CALCIUM 9.0 8.6*   PT/INR No results for input(s): LABPROT, INR in the last 72 hours. CMP     Component Value Date/Time   NA 138 09/15/2017 0606   NA 141 06/02/2017 0952   K 4.4 09/15/2017 0606   K 4.0 06/02/2017 0952   CL 105 09/15/2017 0606   CL 100 01/06/2013 0830   CO2 22 09/15/2017 0606   CO2 28 06/02/2017 0952   GLUCOSE 202 (H) 09/15/2017 0606   GLUCOSE 104 06/02/2017 0952   GLUCOSE 144 (H) 01/06/2013 0830   BUN 16 09/15/2017 0606   BUN 15.7 06/02/2017 0952   CREATININE 1.03 09/15/2017 0606   CREATININE 0.9 06/02/2017 0952    CALCIUM 8.6 (L) 09/15/2017 0606   CALCIUM 9.4 06/02/2017 0952   PROT 5.9 (L) 09/15/2017 0606   PROT 6.7 06/02/2017 0952   ALBUMIN 3.3 (L) 09/15/2017 0606   ALBUMIN 3.6 06/02/2017 0952   AST 15 09/15/2017 0606   AST 13 06/02/2017 0952   ALT 11 (L) 09/15/2017 0606   ALT 9 06/02/2017 0952   ALKPHOS 75 09/15/2017 0606   ALKPHOS 81 06/02/2017 0952   BILITOT 0.8 09/15/2017 0606   BILITOT 0.46 06/02/2017 0952   GFRNONAA >60 09/15/2017 0606   GFRAA >60 09/15/2017 0606   Lipase     Component Value Date/Time   LIPASE 20 09/14/2017 0253       Studies/Results: Ct Abdomen Pelvis W Contrast  Result Date: 09/14/2017 CLINICAL DATA:  Right-sided abdominal pain, onset yesterday. EXAM: CT ABDOMEN AND PELVIS WITH CONTRAST TECHNIQUE: Multidetector CT imaging of the abdomen and pelvis was performed using the standard protocol following bolus administration of intravenous contrast. CONTRAST:  100mL ISOVUE-300 IOPAMIDOL (ISOVUE-300) INJECTION 61% COMPARISON:  CT abdomen 02/15/2016, CT abdomen/ pelvis 03/06/2013 FINDINGS: Lower chest: Mild left basilar atelectasis.  No pleural fluid. Hepatobiliary: No focal hepatic lesion. There is an delayed heterogeneous soft tissue density measuring 2.5 x 2.3 cm adjacent to the right hepatic lobe abutting the intercostal  muscles. No biliary dilatation. Post cholecystectomy. Pancreas: Fatty atrophy.  No ductal dilatation or inflammation. Spleen: Normal in size without focal abnormality. Adrenals/Urinary Tract: Normal adrenal glands. No hydronephrosis or perinephric edema. Homogeneous renal enhancement with symmetric excretion on delayed phase imaging. Urinary bladder is minimally distended without wall thickening. Stomach/Bowel: Mild appendiceal dilatation measuring 10 mm with intraluminal low-density (low-density appreciated on image 47 series 2). Mild appendiceal wall enhancement. Vague hyperdensity in the proximal appendix may be appendicolith. There is minimal  periappendiceal inflammation. Appendix: Location: Retrocecal. Diameter: 10 mm Appendicolith: Probably. Mucosal hyper-enhancement: Yes. Extraluminal gas: [name] Periappendiceal collection: [name] Stomach is nondistended. No small bowel inflammation or obstruction. No bowel wall thickening. Small volume of colonic stool. Sigmoid colon is tortuous. Vascular/Lymphatic: Moderate aortic atherosclerosis. No enlarged abdominal or pelvic lymph nodes. Reproductive: Prostate is unremarkable. Other: No free air, free fluid, or intra-abdominal fluid collection. Minimal fat in the left inguinal canal. Musculoskeletal: Degenerative change in the lower lumbar spine. There are no acute or suspicious osseous abnormalities. IMPRESSION: 1. Findings suspicious for early acute appendicitis. Mild appendiceal dilatation containing intra low density, with mucosal enhancement, possible appendicolith and minimal periappendiceal inflammation. The intraluminal low-density raises the possibility of appendiceal mucocele. No perforation. 2. Ovoid 2.5 cm heterogeneous intrahepatic soft tissue density adjacent of the right lobe of the liver in the region of the intercostal muscles, likely sequela of interval cholecystectomy. A dropped gallstone could produce this appearance. 3.  Aortic Atherosclerosis (ICD10-I70.0). Electronically Signed   By: Rubye Oaks M.D.   On: 09/14/2017 04:28   Dg Chest Port 1 View  Result Date: 09/14/2017 CLINICAL DATA:  Shortness of breath. EXAM: PORTABLE CHEST 1 VIEW COMPARISON:  02/15/2016 FINDINGS: The heart is mildly enlarged but stable. Prominent mediastinal and hilar contours are unchanged. Stable pacer wires. Stable surgical changes from bypass surgery. No acute pulmonary findings. The bony thorax is intact. Stable surgical changes in the right neck area. IMPRESSION: Cardiac enlargement but no acute pulmonary findings. Electronically Signed   By: Rudie Meyer M.D.   On: 09/14/2017 13:45     Anti-infectives: Anti-infectives (From admission, onward)   Start     Dose/Rate Route Frequency Ordered Stop   09/14/17 1100  piperacillin-tazobactam (ZOSYN) IVPB 3.375 g  Status:  Discontinued     3.375 g 12.5 mL/hr over 240 Minutes Intravenous Every 8 hours 09/14/17 0756 09/14/17 1939   09/14/17 0445  piperacillin-tazobactam (ZOSYN) IVPB 3.375 g     3.375 g 100 mL/hr over 30 Minutes Intravenous  Once 09/14/17 0435 09/14/17 0519       Assessment/Plan HTN T2DM Hx of CABG Hx of CVA Chronic combined systolic and diastolic CHF AICD  Acute Appendicitis  S/P laparoscopic appendectomy 09/14/17 Dr. Lindie Spruce - POD#1 - mild expected post-op pain - mobilize patient - advance diet to HH/CM - will put in f/u information  FEN: HH/CM VTE: lovenox, SCDs - ok to resume plavix ID: Zosyn 12/17  Dispo: Okay for discharge later today from a surgical standpoint if tolerating diet. Will have patient follow up in [facility] office.    LOS: 0 days    Wells Guiles , [facility] 09/15/2017, 8:11 AM Pager: [phone] Consults: [phone] Mon-Fri 7:00 am-4:30 pm Sat-Sun 7:00 am-11:30 am

## 2017-09-15 NOTE — Progress Notes (Signed)
Patient is alert and oriented, vital signs are stable, abdomen is distended but soft, bowel sounds are hypoactive, patient is tolerating his diet without complaints of nausea or vomiting, patient up ambulating and tolerating well, patient has declined pain medication this morning shift and states that pain is tolerable, family at bedside Stanford Breed RN 1:19 PM 09-15-2017

## 2017-09-15 NOTE — Anesthesia Postprocedure Evaluation (Signed)
Anesthesia Post Note  Patient: [name]  Procedure(s) Performed: APPENDECTOMY LAPAROSCOPIC (N/A )     Patient location during evaluation: PACU Anesthesia Type: General Level of consciousness: awake and alert Pain management: pain level controlled Vital Signs Assessment: post-procedure vital signs reviewed and stable Respiratory status: spontaneous breathing, nonlabored ventilation, respiratory function stable and patient connected to nasal cannula oxygen Cardiovascular status: blood pressure returned to baseline and stable Postop Assessment: no apparent nausea or vomiting Anesthetic complications: no    Last Vitals:  Vitals:   09/15/17 0009 09/15/17 0743  BP: (!) 109/57 113/62  Pulse: (!) 59 (!) 58  Resp: 18 18  Temp: 36.7 C (!) 36.3 C  SpO2: 96% 100%    Last Pain:  Vitals:   09/15/17 0841  TempSrc:   PainSc: 0-No pain                 Elisabetta Mishra S

## 2017-09-15 NOTE — Discharge Summary (Signed)
Physician Discharge Summary  [name]  WUJ:[id number]  DOB: 1946/06/10  DOA: 09/14/2017 PCP: [name], DO  Admit date: 09/14/2017 Discharge date: 09/15/2017  Admitted From: Home  Disposition:  Home   Recommendations for Outpatient Follow-up:  1. Follow up with PCP in 1-2 weeks 2. Follow up with surgery in 1-2 weeks  3. Please obtain BMP/CBC in one week to monitor Hgb and Cr   Discharge Condition: Stable  CODE STATUS: Full code  Diet recommendation: Heart Healthy   Brief/Interim Summary: For full details see H&P/Progress notes but in brief, [name] is a 71 year old male with a history of cardiomyopathy, ejection fraction of 25-30% status post AICD, CAD status post CABG, previous history of stroke, diabetes, hypertension, presents to the hospital with right lower quadrant abdominal pain.  CT imaging of his abdomen indicated possible early acute appendicitis.  He was been started on intravenous antibiotics.  Seen by general surgery.  After discussion with anesthesia, it was felt that patient would be high risk for surgery at [facility] due to his multiple comorbidities.  Recommendations were to transfer to [facility] for further management. Patient underwent lap appy on 09/14/17 with no further complications. Patient recovered well, tolerating diet and passing flatus. Gen surgery cleared for discharge. Patient was discharged home to follow up with PCP.   Subjective: Patient seen and examined, doing well, have no complaints this morning. Tolerated diet well. Afebrile. Abdominal pain minimal. Passing flatus. Good UOP. No acute events overnight   Discharge Diagnoses/Hospital Course:  Acute appendicitis  S/p Lap appendectomy  Initially treated with empiric abx Zosyn 12/17 - now d/ced  Follow up with gen surgery as outpatient   Chronic combined CHF.  Ejection fraction of 25-30%.  Status post AICD.   Currently compensated  Continue home meds with no  changes   Previous history of stroke with residual right-sided hemiparesis.  Stable, Plavix was held for surgical procedure can resume tonight   Diabetes type 2.   CBG stable  Resume home medication with no changes   Hypertension.  BP stable  Resume home medication with no changes  Coronary artery disease status post CABG Continue ASA and Plavix   All other chronic medical condition were stable during the hospitalization.  On the day of the discharge the patient's vitals were stable, and no other acute medical condition were reported by patient. the patient was felt safe to be discharge to home   Discharge Instructions  You were cared for by a hospitalist during your hospital stay. If you have any questions about your discharge medications or the care you received while you were in the hospital after you are discharged, you can call the unit and asked to speak with the hospitalist on call if the hospitalist that took care of you is not available. Once you are discharged, your primary care physician will handle any further medical issues. Please note that NO REFILLS for any discharge medications will be authorized once you are discharged, as it is imperative that you return to your primary care physician (or establish a relationship with a primary care physician if you do not have one) for your aftercare needs so that they can reassess your need for medications and monitor your lab values.  Discharge Instructions    Call MD for:  difficulty breathing, headache or visual disturbances   Complete by:  As directed    Call MD for:  extreme fatigue   Complete by:  As directed  Call MD for:  hives   Complete by:  As directed    Call MD for:  persistant dizziness or light-headedness   Complete by:  As directed    Call MD for:  persistant nausea and vomiting   Complete by:  As directed    Call MD for:  redness, tenderness, or signs of infection (pain, swelling, redness, odor or green/yellow  discharge around incision site)   Complete by:  As directed    Call MD for:  severe uncontrolled pain   Complete by:  As directed    Call MD for:  temperature >100.4   Complete by:  As directed    Diet - low sodium heart healthy   Complete by:  As directed    Increase activity slowly   Complete by:  As directed      Allergies as of 09/15/2017      Reactions   Codeine Nausea And Vomiting      Medication List    TAKE these medications   aspirin 81 MG EC tablet Take 1 tablet (81 mg total) by mouth daily. Start taking on:  09/16/2017   atorvastatin 80 MG tablet Commonly known as:  LIPITOR Take 0.5 tablets (40 mg total) by mouth every other day.   baclofen 20 MG tablet Commonly known as:  LIORESAL Take 20 mg by mouth 2 (two) times daily.   carvedilol 12.5 MG tablet Commonly known as:  COREG Take 1 tablet (12.5 mg total) by mouth 2 (two) times daily with a meal.   clopidogrel 75 MG tablet Commonly known as:  PLAVIX Take 1 tablet (75 mg total) by mouth daily. Start taking on:  09/16/2017   furosemide 40 MG tablet Commonly known as:  LASIX Take 40 mg by mouth daily as needed for fluid.   INVOKANA 300 MG Tabs tablet Generic drug:  canagliflozin Take 1 tablet by mouth daily.   linagliptin 5 MG Tabs tablet Commonly known as:  TRADJENTA Take 5 mg by mouth daily after breakfast.   meclizine 25 MG tablet Commonly known as:  ANTIVERT Take 25 mg by mouth 4 (four) times daily as needed. For dizziness   oxyCODONE-acetaminophen 10-325 MG tablet Commonly known as:  PERCOCET Take 1 tablet by mouth every 4 (four) hours as needed for pain.   polyethylene glycol packet Commonly known as:  MIRALAX / GLYCOLAX Take 17 g by mouth daily.   SENNA CO Take 1 tablet by mouth daily as needed (constipation).   spironolactone 25 MG tablet Commonly known as:  ALDACTONE Take 1 tablet (25 mg total) by mouth every morning.      Follow-up Information    [facility] Go  on 10/06/2017.   Specialty:  General Surgery Why:  Your appointment is at 8:45 AM. Please arrive 30 min prior to appointment time. Bring photo ID and insurance information.  Contact information: [address] [place] [address] [phone]        [name], DO. Schedule an appointment as soon as possible for a visit in 1 week(s).   Why:  Hospital follow up  Contact information: [address] [place] [address] [phone]          Allergies  Allergen Reactions  . Codeine Nausea And Vomiting    Consultations:  Gen surgery    Procedures/Studies: Ct Abdomen Pelvis W Contrast  Result Date: 09/14/2017 CLINICAL DATA:  Right-sided abdominal pain, onset yesterday. EXAM: CT ABDOMEN AND PELVIS WITH CONTRAST  TECHNIQUE: Multidetector CT imaging of the abdomen and pelvis was performed using the standard protocol following bolus administration of intravenous contrast. CONTRAST:  100mL ISOVUE-300 IOPAMIDOL (ISOVUE-300) INJECTION 61% COMPARISON:  CT abdomen 02/15/2016, CT abdomen/ pelvis 03/06/2013 FINDINGS: Lower chest: Mild left basilar atelectasis.  No pleural fluid. Hepatobiliary: No focal hepatic lesion. There is an delayed heterogeneous soft tissue density measuring 2.5 x 2.3 cm adjacent to the right hepatic lobe abutting the intercostal muscles. No biliary dilatation. Post cholecystectomy. Pancreas: Fatty atrophy.  No ductal dilatation or inflammation. Spleen: Normal in size without focal abnormality. Adrenals/Urinary Tract: Normal adrenal glands. No hydronephrosis or perinephric edema. Homogeneous renal enhancement with symmetric excretion on delayed phase imaging. Urinary bladder is minimally distended without wall thickening. Stomach/Bowel: Mild appendiceal dilatation measuring 10 mm with intraluminal low-density (low-density appreciated on image 47 series 2). Mild appendiceal wall enhancement. Vague hyperdensity in the proximal appendix may be appendicolith.  There is minimal periappendiceal inflammation. Appendix: Location: Retrocecal. Diameter: 10 mm Appendicolith: Probably. Mucosal hyper-enhancement: Yes. Extraluminal gas: [name] Periappendiceal collection: [name] Stomach is nondistended. No small bowel inflammation or obstruction. No bowel wall thickening. Small volume of colonic stool. Sigmoid colon is tortuous. Vascular/Lymphatic: Moderate aortic atherosclerosis. No enlarged abdominal or pelvic lymph nodes. Reproductive: Prostate is unremarkable. Other: No free air, free fluid, or intra-abdominal fluid collection. Minimal fat in the left inguinal canal. Musculoskeletal: Degenerative change in the lower lumbar spine. There are no acute or suspicious osseous abnormalities. IMPRESSION: 1. Findings suspicious for early acute appendicitis. Mild appendiceal dilatation containing intra low density, with mucosal enhancement, possible appendicolith and minimal periappendiceal inflammation. The intraluminal low-density raises the possibility of appendiceal mucocele. No perforation. 2. Ovoid 2.5 cm heterogeneous intrahepatic soft tissue density adjacent of the right lobe of the liver in the region of the intercostal muscles, likely sequela of interval cholecystectomy. A dropped gallstone could produce this appearance. 3.  Aortic Atherosclerosis (ICD10-I70.0). Electronically Signed   By: Rubye OaksMelanie  Ehinger M.D.   On: 09/14/2017 04:28   Dg Chest Port 1 View  Result Date: 09/14/2017 CLINICAL DATA:  Shortness of breath. EXAM: PORTABLE CHEST 1 VIEW COMPARISON:  02/15/2016 FINDINGS: The heart is mildly enlarged but stable. Prominent mediastinal and hilar contours are unchanged. Stable pacer wires. Stable surgical changes from bypass surgery. No acute pulmonary findings. The bony thorax is intact. Stable surgical changes in the right neck area. IMPRESSION: Cardiac enlargement but no acute pulmonary findings. Electronically Signed   By: Rudie MeyerP.  Gallerani M.D.   On: 09/14/2017 13:45      Discharge Exam: Vitals:   09/15/17 0009 09/15/17 0743  BP: (!) 109/57 113/62  Pulse: (!) 59 (!) 58  Resp: 18 18  Temp: 98 F (36.7 C) (!) 97.4 F (36.3 C)  SpO2: 96% 100%   Vitals:   09/14/17 2005 09/15/17 0002 09/15/17 0009 09/15/17 0743  BP: (!) 123/54 (!) 126/58 (!) 109/57 113/62  Pulse: 60 99 (!) 59 (!) 58  Resp: 16 18 18 18   Temp: 98.2 F (36.8 C) 98.2 F (36.8 C) 98 F (36.7 C) (!) 97.4 F (36.3 C)  TempSrc: Oral Oral Oral Oral  SpO2: 97% 100% 96% 100%  Weight:    93.2 kg (205 lb 8 oz)  Height:        General: Pt is alert, awake, not in acute distress Cardiovascular: RRR, S1/S2 +, no rubs, no gallops Respiratory: CTA bilaterally, no wheezing, no rhonchi Abdominal: Soft, mild distended, mild tenderness, positive bowel sounds, incision d/i/c  Extremities: no edema   The  results of significant diagnostics from this hospitalization (including imaging, microbiology, ancillary and laboratory) are listed below for reference.     Microbiology: Recent Results (from the past 240 hour(s))  Surgical PCR screen     Status: None   Collection Time: 09/14/17  4:18 PM  Result Value Ref Range Status   MRSA, PCR NEGATIVE NEGATIVE Final   Staphylococcus aureus NEGATIVE NEGATIVE Final    Comment: (NOTE) The Xpert SA Assay (FDA approved for NASAL specimens in patients 51 years of age and older), is one component of a comprehensive surveillance program. It is not intended to diagnose infection nor to guide or monitor treatment.      Labs: BNP (last 3 results) No results for input(s): BNP in the last 8760 hours. Basic Metabolic Panel: Recent Labs  Lab 09/14/17 0253 09/15/17 0606  NA 139 138  K 4.2 4.4  CL 105 105  CO2 26 22  GLUCOSE 215* 202*  BUN 17 16  CREATININE 0.97 1.03  CALCIUM 9.0 8.6*   Liver Function Tests: Recent Labs  Lab 09/14/17 0253 09/15/17 0606  AST 18 15  ALT 10* 11*  ALKPHOS 74 75  BILITOT 0.6 0.8  PROT 6.6 5.9*  ALBUMIN 3.9 3.3*    Recent Labs  Lab 09/14/17 0253  LIPASE 20   No results for input(s): AMMONIA in the last 168 hours. CBC: Recent Labs  Lab 09/14/17 0253 09/15/17 0606  WBC 10.2 6.9  NEUTROABS 7.7  --   HGB 12.6* 13.8  HCT 39.5 42.2  MCV 88.8 87.0  PLT 183 204   Cardiac Enzymes: No results for input(s): CKTOTAL, CKMB, CKMBINDEX, TROPONINI in the last 168 hours. BNP: Invalid input(s): POCBNP CBG: Recent Labs  Lab 09/14/17 1111 09/14/17 1624 09/14/17 1831 09/14/17 2158 09/15/17 0719  GLUCAP 77 103* 145* 209* 173*   D-Dimer No results for input(s): DDIMER in the last 72 hours. Hgb A1c Recent Labs    09/14/17 0258  HGBA1C 7.8*   Lipid Profile No results for input(s): CHOL, HDL, LDLCALC, TRIG, CHOLHDL, LDLDIRECT in the last 72 hours. Thyroid function studies No results for input(s): TSH, T4TOTAL, T3FREE, THYROIDAB in the last 72 hours.  Invalid input(s): FREET3 Anemia work up No results for input(s): VITAMINB12, FOLATE, FERRITIN, TIBC, IRON, RETICCTPCT in the last 72 hours. Urinalysis    Component Value Date/Time   COLORURINE STRAW (A) 09/14/2017 0245   APPEARANCEUR CLEAR 09/14/2017 0245   LABSPEC 1.009 09/14/2017 0245   PHURINE 5.0 09/14/2017 0245   GLUCOSEU >=500 (A) 09/14/2017 0245   HGBUR NEGATIVE 09/14/2017 0245   BILIRUBINUR NEGATIVE 09/14/2017 0245   KETONESUR NEGATIVE 09/14/2017 0245   PROTEINUR NEGATIVE 09/14/2017 0245   UROBILINOGEN 0.2 08/09/2013 1012   NITRITE NEGATIVE 09/14/2017 0245   LEUKOCYTESUR NEGATIVE 09/14/2017 0245   Sepsis Labs Invalid input(s): PROCALCITONIN,  WBC,  LACTICIDVEN Microbiology Recent Results (from the past 240 hour(s))  Surgical PCR screen     Status: None   Collection Time: 09/14/17  4:18 PM  Result Value Ref Range Status   MRSA, PCR NEGATIVE NEGATIVE Final   Staphylococcus aureus NEGATIVE NEGATIVE Final    Comment: (NOTE) The Xpert SA Assay (FDA approved for NASAL specimens in patients 93 years of age and older), is one  component of a comprehensive surveillance program. It is not intended to diagnose infection nor to guide or monitor treatment.      Time coordinating discharge: 32 minutes  SIGNED:  Latrelle Dodrill, MD  [facility] 09/15/2017, 12:57 PM  Pager please text page via  [email or web address] Password [email or web address]

## 2017-09-15 NOTE — Discharge Instructions (Signed)

## 2017-10-01 ENCOUNTER — Ambulatory Visit: Payer: Medicare HMO

## 2017-10-01 ENCOUNTER — Telehealth: Payer: Self-pay | Admitting: Cardiology

## 2017-10-01 ENCOUNTER — Ambulatory Visit (INDEPENDENT_AMBULATORY_CARE_PROVIDER_SITE_OTHER): Payer: Medicare HMO | Admitting: *Deleted

## 2017-10-01 DIAGNOSIS — I255 Ischemic cardiomyopathy: Secondary | ICD-10-CM | POA: Diagnosis not present

## 2017-10-01 DIAGNOSIS — I428 Other cardiomyopathies: Secondary | ICD-10-CM

## 2017-10-01 NOTE — Telephone Encounter (Signed)
LMOVM reminding pt to send remote transmission.   

## 2017-10-02 ENCOUNTER — Encounter: Payer: Self-pay | Admitting: Cardiology

## 2017-10-05 ENCOUNTER — Inpatient Hospital Stay: Payer: Medicare HMO | Attending: Oncology

## 2017-10-05 VITALS — BP 155/71 | HR 65 | Temp 97.5°F | Resp 20

## 2017-10-05 DIAGNOSIS — D519 Vitamin B12 deficiency anemia, unspecified: Secondary | ICD-10-CM | POA: Diagnosis not present

## 2017-10-05 DIAGNOSIS — D509 Iron deficiency anemia, unspecified: Secondary | ICD-10-CM

## 2017-10-05 MED ORDER — CYANOCOBALAMIN 1000 MCG/ML IJ SOLN
1000.0000 ug | Freq: Once | INTRAMUSCULAR | Status: AC
  Administered 2017-10-05: 1000 ug via INTRAMUSCULAR

## 2017-10-05 NOTE — Patient Instructions (Signed)

## 2017-10-07 ENCOUNTER — Telehealth: Payer: Self-pay | Admitting: Cardiology

## 2017-10-07 NOTE — Telephone Encounter (Signed)
Follow Up:    Pt wants to know if you received his transmission from 10-01-17?

## 2017-10-07 NOTE — Telephone Encounter (Signed)
LVM on pts home phone ( ok per DPR) that remote transmission was received.

## 2017-10-16 ENCOUNTER — Encounter: Payer: Self-pay | Admitting: Cardiology

## 2017-10-16 LAB — CUP PACEART REMOTE DEVICE CHECK
Battery Voltage: 2.98 V
Brady Statistic AP VS Percent: 77 %
Brady Statistic AS VP Percent: 1 %
Brady Statistic RA Percent Paced: 77 %
Brady Statistic RV Percent Paced: 1 %
Date Time Interrogation Session: 20190105001444
HIGH POWER IMPEDANCE MEASURED VALUE: 66 Ohm
HighPow Impedance: 66 Ohm
Implantable Lead Implant Date: 20090708
Implantable Lead Location: 753859
Implantable Lead Location: 753860
Implantable Lead Model: 7122
Lead Channel Impedance Value: 350 Ohm
Lead Channel Pacing Threshold Amplitude: 1 V
Lead Channel Pacing Threshold Pulse Width: 0.9 ms
Lead Channel Sensing Intrinsic Amplitude: 1.9 mV
Lead Channel Sensing Intrinsic Amplitude: 12 mV
Lead Channel Setting Pacing Amplitude: 2.5 V
Lead Channel Setting Pacing Pulse Width: 0.9 ms
MDC IDC LEAD IMPLANT DT: 20090708
MDC IDC MSMT BATTERY REMAINING LONGEVITY: 66 mo
MDC IDC MSMT BATTERY REMAINING PERCENTAGE: 77 %
MDC IDC MSMT LEADCHNL RA PACING THRESHOLD AMPLITUDE: 0.5 V
MDC IDC MSMT LEADCHNL RA PACING THRESHOLD PULSEWIDTH: 0.5 ms
MDC IDC MSMT LEADCHNL RV IMPEDANCE VALUE: 340 Ohm
MDC IDC PG IMPLANT DT: 20170106
MDC IDC SET LEADCHNL RA PACING AMPLITUDE: 2 V
MDC IDC SET LEADCHNL RV SENSING SENSITIVITY: 0.5 mV
MDC IDC STAT BRADY AP VP PERCENT: 1 %
MDC IDC STAT BRADY AS VS PERCENT: 23 %
Pulse Gen Serial Number: 7306179

## 2017-10-16 NOTE — Progress Notes (Signed)
Remote ICD transmission.   

## 2017-11-02 ENCOUNTER — Inpatient Hospital Stay: Payer: Medicare HMO | Attending: Oncology

## 2017-11-02 VITALS — BP 142/72 | HR 71 | Temp 97.8°F | Resp 18

## 2017-11-02 DIAGNOSIS — D509 Iron deficiency anemia, unspecified: Secondary | ICD-10-CM

## 2017-11-02 DIAGNOSIS — D519 Vitamin B12 deficiency anemia, unspecified: Secondary | ICD-10-CM | POA: Diagnosis present

## 2017-11-02 MED ORDER — CYANOCOBALAMIN 1000 MCG/ML IJ SOLN
1000.0000 ug | Freq: Once | INTRAMUSCULAR | Status: AC
  Administered 2017-11-02: 1000 ug via INTRAMUSCULAR

## 2017-11-02 NOTE — Patient Instructions (Signed)

## 2017-11-30 ENCOUNTER — Inpatient Hospital Stay: Payer: Medicare HMO | Attending: Oncology

## 2017-11-30 ENCOUNTER — Inpatient Hospital Stay: Payer: Medicare HMO

## 2017-11-30 VITALS — BP 144/74 | HR 70 | Temp 97.9°F | Resp 18

## 2017-11-30 DIAGNOSIS — E538 Deficiency of other specified B group vitamins: Secondary | ICD-10-CM | POA: Insufficient documentation

## 2017-11-30 DIAGNOSIS — D509 Iron deficiency anemia, unspecified: Secondary | ICD-10-CM | POA: Diagnosis not present

## 2017-11-30 LAB — CBC WITH DIFFERENTIAL/PLATELET
BASOS ABS: 0 10*3/uL (ref 0.0–0.1)
BASOS PCT: 0 %
EOS ABS: 0.2 10*3/uL (ref 0.0–0.5)
EOS PCT: 3 %
HCT: 40.6 % (ref 38.4–49.9)
Hemoglobin: 13.1 g/dL (ref 13.0–17.1)
LYMPHS PCT: 21 %
Lymphs Abs: 1.5 10*3/uL (ref 0.9–3.3)
MCH: 28.1 pg (ref 27.2–33.4)
MCHC: 32.3 g/dL (ref 32.0–36.0)
MCV: 87.1 fL (ref 79.3–98.0)
MONO ABS: 0.9 10*3/uL (ref 0.1–0.9)
Monocytes Relative: 13 %
Neutro Abs: 4.5 10*3/uL (ref 1.5–6.5)
Neutrophils Relative %: 63 %
Platelets: 199 10*3/uL (ref 140–400)
RBC: 4.66 MIL/uL (ref 4.20–5.82)
RDW: 14.5 % (ref 11.0–14.6)
WBC: 7.1 10*3/uL (ref 4.0–10.3)

## 2017-11-30 LAB — IRON AND TIBC
Iron: 37 ug/dL — ABNORMAL LOW (ref 42–163)
Saturation Ratios: 11 % — ABNORMAL LOW (ref 42–163)
TIBC: 346 ug/dL (ref 202–409)
UIBC: 309 ug/dL

## 2017-11-30 LAB — FERRITIN: Ferritin: 41 ng/mL (ref 22–316)

## 2017-11-30 MED ORDER — CYANOCOBALAMIN 1000 MCG/ML IJ SOLN
1000.0000 ug | Freq: Once | INTRAMUSCULAR | Status: AC
  Administered 2017-11-30: 1000 ug via INTRAMUSCULAR

## 2017-11-30 NOTE — Patient Instructions (Signed)

## 2017-12-31 ENCOUNTER — Inpatient Hospital Stay: Payer: Medicare HMO

## 2017-12-31 ENCOUNTER — Telehealth: Payer: Self-pay | Admitting: Cardiology

## 2017-12-31 ENCOUNTER — Inpatient Hospital Stay: Payer: Medicare HMO | Attending: Oncology | Admitting: Oncology

## 2017-12-31 ENCOUNTER — Telehealth: Payer: Self-pay | Admitting: Oncology

## 2017-12-31 ENCOUNTER — Ambulatory Visit (INDEPENDENT_AMBULATORY_CARE_PROVIDER_SITE_OTHER): Payer: Medicare HMO | Admitting: *Deleted

## 2017-12-31 VITALS — BP 153/71 | HR 58 | Temp 98.8°F | Resp 20 | Ht 73.0 in | Wt 213.2 lb

## 2017-12-31 DIAGNOSIS — Z7982 Long term (current) use of aspirin: Secondary | ICD-10-CM | POA: Diagnosis not present

## 2017-12-31 DIAGNOSIS — D509 Iron deficiency anemia, unspecified: Secondary | ICD-10-CM

## 2017-12-31 DIAGNOSIS — I255 Ischemic cardiomyopathy: Secondary | ICD-10-CM

## 2017-12-31 DIAGNOSIS — E538 Deficiency of other specified B group vitamins: Secondary | ICD-10-CM

## 2017-12-31 DIAGNOSIS — Z79899 Other long term (current) drug therapy: Secondary | ICD-10-CM | POA: Diagnosis not present

## 2017-12-31 DIAGNOSIS — Z95810 Presence of automatic (implantable) cardiac defibrillator: Secondary | ICD-10-CM

## 2017-12-31 LAB — VITAMIN B12: Vitamin B-12: 263 pg/mL (ref 180–914)

## 2017-12-31 MED ORDER — CYANOCOBALAMIN 1000 MCG/ML IJ SOLN
1000.0000 ug | Freq: Once | INTRAMUSCULAR | Status: AC
  Administered 2017-12-31: 1000 ug via INTRAMUSCULAR

## 2017-12-31 NOTE — Progress Notes (Signed)
Remote ICD transmission.   

## 2017-12-31 NOTE — Patient Instructions (Signed)

## 2017-12-31 NOTE — Progress Notes (Signed)
[facility] HEMATOLOGY OFFICE PROGRESS NOTE 12/31/17   [name] Jester, DO [address] [place] [address]  DIAGNOSIS: 71 year old man with:  1. Iron deficiency anemia diagnosed in 2008 because of lack of absorption issues.  2. B12 deficiency noted in 2008.   CURRENT THERAPY:   B-12 1000 mcg IM injection on a monthly basis.  Intermittent IV iron infusion utilizing Feraheme.  Last IV iron infusion was April 2018.  INTERVAL HISTORY:  [name] is here for follow-up.  Since last visit, he was hospitalized in December 2018 for appendicitis and underwent a laparoscopic appendectomy.   He does not report any headaches, blurry vision, syncope or seizures. He does not report any fevers, chills, sweats or weight loss. He is not report any chest pain, palpitation, orthopnea. He does not report any cough, wheezing or hemoptysis. He does not report any nausea, vomiting, abdominal pain. He does not report any frequency urgency or hesitancy.  He denies any arthralgias or myalgias.  He denies any skin rashes or lesions.  He denies any lymphadenopathy or petechiae.  Remainder review of systems is negative.   ALLERGIES:  is allergic to codeine.  MEDICATIONS:   Current Outpatient Medications  Medication Sig Dispense Refill  . aspirin 81 MG EC tablet Take 1 tablet (81 mg total) by mouth daily. 30 tablet 12  . atorvastatin (LIPITOR) 80 MG tablet Take 0.5 tablets (40 mg total) by mouth every other day. 90 tablet 3  . baclofen (LIORESAL) 20 MG tablet Take 20 mg by mouth 2 (two) times daily.     . Canagliflozin (INVOKANA) 300 MG TABS Take 1 tablet by mouth daily.    . carvedilol (COREG) 12.5 MG tablet Take 1 tablet (12.5 mg total) by mouth 2 (two) times daily with a meal. 180 tablet 3  . clopidogrel (PLAVIX) 75 MG tablet Take 1 tablet (75 mg total) by mouth daily. 90 tablet 3  . furosemide (LASIX) 40 MG tablet Take 40 mg by mouth daily as needed for fluid.     [name] linagliptin  (TRADJENTA) 5 MG TABS tablet Take 5 mg by mouth daily after breakfast.     . meclizine (ANTIVERT) 25 MG tablet Take 25 mg by mouth 4 (four) times daily as needed. For dizziness    . oxyCODONE-acetaminophen (PERCOCET) 10-325 MG per tablet Take 1 tablet by mouth every 4 (four) hours as needed for pain.     . polyethylene glycol (MIRALAX / GLYCOLAX) packet Take 17 g by mouth daily. 14 each 0  . SENNA CO Take 1 tablet by mouth daily as needed (constipation).     [name] spironolactone (ALDACTONE) 25 MG tablet Take 1 tablet (25 mg total) by mouth every morning. 90 tablet 3   No current facility-administered medications for this visit.      PHYSICAL EXAMINATION: ECOG PERFORMANCE STATUS: 1  GENERAL: Well-appearing gentleman without distress. EYES: Pupils are equal and round reactive to light..  OROPHARYNX: Mucous membranes are moist and pink. LYMPH:  cervical, axillary or supraclavicular lymph node areas without abnormalities. LUNGS: clear in all lung fields without any wheezes or dullness to percussion. HEART: Regular rate without any murmurs or gallops. ABDOMEN: Soft without any rebound or guarding.  No shifting dullness or ascites. Musculoskeletal: No joint deformity or effusion. NEURO: No motor or sensory deficits. Skin: No rashes or lesions.    Labs:  Lab Results  Component Value Date   WBC 7.1 11/30/2017   HGB 13.1 11/30/2017  HCT 40.6 11/30/2017   MCV 87.1 11/30/2017   PLT 199 11/30/2017   NEUTROABS 4.5 11/30/2017      Chemistry      Component Value Date/Time   NA 138 09/15/2017 0606   NA 141 06/02/2017 0952   K 4.4 09/15/2017 0606   K 4.0 06/02/2017 0952   CL 105 09/15/2017 0606   CL 100 01/06/2013 0830   CO2 22 09/15/2017 0606   CO2 28 06/02/2017 0952   BUN 16 09/15/2017 0606   BUN 15.7 06/02/2017 0952   CREATININE 1.03 09/15/2017 0606   CREATININE 0.9 06/02/2017 0952      Component Value Date/Time   CALCIUM 8.6 (L) 09/15/2017 0606   CALCIUM 9.4 06/02/2017 0952    ALKPHOS 75 09/15/2017 0606   ALKPHOS 81 06/02/2017 0952   AST 15 09/15/2017 0606   AST 13 06/02/2017 0952   ALT 11 (L) 09/15/2017 0606   ALT 9 06/02/2017 0952   BILITOT 0.8 09/15/2017 0606   BILITOT 0.46 06/02/2017 0952     Results for [name], [name] (MRN [id number]) as of 12/31/2017 08:14  Ref. Range 11/30/2017 08:10  Iron Latest Ref Range: 42 - 163 ug/dL 37 (L)  UIBC Latest Units: ug/dL 811  TIBC Latest Ref Range: 202 - 409 ug/dL 572  Saturation Ratios Latest Ref Range: 42 - 163 % 11 (L)  Ferritin Latest Ref Range: 22 - 316 ng/mL 41     Assessment and plan:    72 year old gentleman with the following issues:   1.B-12 deficiency diagnosed in 2008 with unknown etiology.  Likely related to poor absorption and have been receiving vitamin B12 injection on a monthly basis.  His hemoglobin and B12 level continues to be maintained at an adequate level.  He reports no complications related to this therapy.  Risks and benefits of continuing vitamin B12 injections were discussed and he is agreeable to continue.  We will continue to monitor his hemoglobin as well as B12 levels periodically.  2. Iron deficiency anemia: Related to poor absorption of iron.  His iron studies from March 2019 were personally reviewed and appears adequate at this time. He will not require any intravenous iron at this time.  We will continue to monitor periodically.  3.  Age-appropriate cancer screening: He is up-to-date on his colonoscopy.  4. Follow-up: Will be in 6 months.    15  minutes was spent with the patient face-to-face today.  More than 50% of time was dedicated to patient counseling, education and coordination of his care.  Eli Hose MD 12/31/17

## 2017-12-31 NOTE — Telephone Encounter (Signed)
Spoke with pt and reminded pt of remote transmission that is due today. Pt verbalized understanding.   

## 2017-12-31 NOTE — Telephone Encounter (Signed)
Appointments scheduled AVS/Calendar printed per 4/4 los °

## 2018-01-05 ENCOUNTER — Encounter: Payer: Self-pay | Admitting: Internal Medicine

## 2018-01-05 ENCOUNTER — Ambulatory Visit: Payer: Medicare HMO | Admitting: Internal Medicine

## 2018-01-05 VITALS — BP 122/70 | HR 60 | Ht 74.0 in | Wt 211.4 lb

## 2018-01-05 DIAGNOSIS — Z95810 Presence of automatic (implantable) cardiac defibrillator: Secondary | ICD-10-CM | POA: Diagnosis not present

## 2018-01-05 DIAGNOSIS — I5022 Chronic systolic (congestive) heart failure: Secondary | ICD-10-CM | POA: Diagnosis not present

## 2018-01-05 DIAGNOSIS — I255 Ischemic cardiomyopathy: Secondary | ICD-10-CM

## 2018-01-05 LAB — CUP PACEART INCLINIC DEVICE CHECK
Battery Remaining Longevity: 72 mo
Brady Statistic RA Percent Paced: 77 %
HighPow Impedance: 58.5 Ohm
Implantable Lead Implant Date: 20090708
Implantable Lead Model: 7122
Implantable Pulse Generator Implant Date: 20170106
Lead Channel Impedance Value: 337.5 Ohm
Lead Channel Pacing Threshold Amplitude: 0.5 V
Lead Channel Pacing Threshold Amplitude: 0.5 V
Lead Channel Pacing Threshold Amplitude: 1 V
Lead Channel Pacing Threshold Pulse Width: 0.5 ms
Lead Channel Pacing Threshold Pulse Width: 0.9 ms
Lead Channel Pacing Threshold Pulse Width: 0.9 ms
Lead Channel Sensing Intrinsic Amplitude: 12 mV
Lead Channel Sensing Intrinsic Amplitude: 2 mV
MDC IDC LEAD IMPLANT DT: 20090708
MDC IDC LEAD LOCATION: 753859
MDC IDC LEAD LOCATION: 753860
MDC IDC MSMT LEADCHNL RA PACING THRESHOLD PULSEWIDTH: 0.5 ms
MDC IDC MSMT LEADCHNL RV IMPEDANCE VALUE: 325 Ohm
MDC IDC MSMT LEADCHNL RV PACING THRESHOLD AMPLITUDE: 1 V
MDC IDC PG SERIAL: 7306179
MDC IDC SESS DTM: 20190409104605
MDC IDC SET LEADCHNL RA PACING AMPLITUDE: 2 V
MDC IDC SET LEADCHNL RV PACING AMPLITUDE: 2.5 V
MDC IDC SET LEADCHNL RV PACING PULSEWIDTH: 0.9 ms
MDC IDC SET LEADCHNL RV SENSING SENSITIVITY: 0.5 mV
MDC IDC STAT BRADY RV PERCENT PACED: 0.64 %

## 2018-01-05 NOTE — Progress Notes (Signed)
HPI [name] returns today for followup. He is a very pleasant 72 year old man with an ischemic cardiomyopathy, chronic systolic heart failure, status post ICD implantation. In the interim, he has been stable. He denies chest pain or shortness of breath. He has mild peripheral edema.  He denies syncope or near-syncope. No chest pain. He has had no ICD shock.   [name]  Allergies  Allergen Reactions  . Codeine Nausea And Vomiting     Current Outpatient Medications  Medication Sig Dispense Refill  . aspirin 81 MG EC tablet Take 1 tablet (81 mg total) by mouth daily. 30 tablet 12  . atorvastatin (LIPITOR) 80 MG tablet Take 0.5 tablets (40 mg total) by mouth every other day. 90 tablet 3  . baclofen (LIORESAL) 20 MG tablet Take 20 mg by mouth 2 (two) times daily.     . Canagliflozin (INVOKANA) 300 MG TABS Take 1 tablet by mouth daily.    . carvedilol (COREG) 12.5 MG tablet Take 1 tablet (12.5 mg total) by mouth 2 (two) times daily with a meal. 180 tablet 3  . clopidogrel (PLAVIX) 75 MG tablet Take 1 tablet (75 mg total) by mouth daily. 90 tablet 3  . furosemide (LASIX) 40 MG tablet Take 40 mg by mouth daily as needed for fluid.     [name] linagliptin (TRADJENTA) 5 MG TABS tablet Take 5 mg by mouth daily after breakfast.     . meclizine (ANTIVERT) 25 MG tablet Take 25 mg by mouth 4 (four) times daily as needed. For dizziness    . oxyCODONE-acetaminophen (PERCOCET) 10-325 MG per tablet Take 1 tablet by mouth every 4 (four) hours as needed for pain.     . polyethylene glycol (MIRALAX / GLYCOLAX) packet Take 17 g by mouth daily. 14 each 0  . SENNA CO Take 1 tablet by mouth daily as needed (constipation).     [name] spironolactone (ALDACTONE) 25 MG tablet Take 1 tablet (25 mg total) by mouth every morning. 90 tablet 3   No current facility-administered medications for this visit.      Past Medical History:  Diagnosis Date  . Anemia    Multifactorial  - hx of B12 def and iron deficiency,  [facility]  . Automatic implantable cardioverter-defibrillator in situ   . CAD (coronary artery disease)    a. STEMI s/p CABG 07/2007 (had preop cardiogenic shock, IABP, VDRF before surgery)  . Cardiomyopathy, ischemic    a. Chronic systolic CHF s/p St. Jude dual chamber ICD 03/2008.  . Carotid stenosis    a. Prior carotid dz/ surgery. b. Carotid dopp 07/2011 - 0-39% bilaterally.;   c. Doppler 11/13: 40-59% RICA, 0-39% LICA  . Chronic renal insufficiency   . Diabetes mellitus   . GI bleed    Reported history  . Hx of CABG   . Hypertension    [place] [facility]   . Mitral regurgitation   . Myocardial infarction (HCC)   . Neuromuscular disorder (HCC)    R sided weakness   . Osteoarthritis   . PAF (paroxysmal atrial fibrillation) (HCC)    a. Poor Coumadin candidate due to hx of GI bleed.  . Sinus bradycardia   . Sleep apnea    study done, 10 yrs ago, unable to tolerate CPAP  . Splenomegaly   . Stroke [facility])    a. CVA 1990s - chronic pain in RUE after stroke.  [name] Systolic CHF, chronic (HCC)  Previously taken off lisinopril by primary doctor due to labs  . Tobacco abuse     ROS:   All systems reviewed and negative except as noted in the HPI.   Past Surgical History:  Procedure Laterality Date  . CARDIAC DEFIBRILLATOR PLACEMENT  04/05/2008   Implantation of a St. Jude dual-chamber defibrillator, Doylene Canning. Ladona Ridgel , MD  . CARDIAC DEFIBRILLATOR PLACEMENT    . CHOLECYSTECTOMY N/A 02/19/2016   Procedure: LAPAROSCOPIC CHOLECYSTECTOMY;  Surgeon: Abigail Miyamoto, MD;  Location: [facility] OR;  Service: General;  Laterality: N/A;  . COLONOSCOPY  07/2007   Rourk: left-sided diverticulum. TI normal. No etiology for IDA.  [name] CORONARY ARTERY BYPASS GRAFT    . CORONARY ARTERY BYPASS GRAFT  08/29/2007   x3; Marilu Favre H. Cornelius Moras MD  . EP IMPLANTABLE DEVICE N/A 10/05/2015   Procedure: ICD Generator Changeout;  Surgeon: Marinus Maw, MD;  Location: [facility] INVASIVE CV  LAB;  Service: Cardiovascular;  Laterality: N/A;  . ERCP N/A 02/18/2016   Procedure: ENDOSCOPIC RETROGRADE CHOLANGIOPANCREATOGRAPHY (ERCP);  Surgeon: Vida Rigger, MD;  Location: [facility] ENDOSCOPY;  Service: Endoscopy;  Laterality: N/A;  . ESOPHAGOGASTRODUODENOSCOPY  07/2007   Rourk: small hh, atrophic gastric mucosa, poor distensibility of stomach ?extrinsic compression, CT showed mild to moderate splenomegaly. No etiology for IDA.  [name] JOINT REPLACEMENT  1990's   L knee  . LAPAROSCOPIC APPENDECTOMY N/A 09/14/2017   Procedure: APPENDECTOMY LAPAROSCOPIC;  Surgeon: Jimmye Norman, MD;  Location: [facility] OR;  Service: General;  Laterality: N/A;  . Percutaneous coronary intervention  01/10/2005   Charlies Constable, MD  . TONSILLECTOMY    . TOTAL KNEE ARTHROPLASTY Right 08/16/2013   Procedure: RIGHT TOTAL KNEE ARTHROPLASTY;  Surgeon: Kathryne Hitch, MD;  Location[facility] OR;  Service: Orthopedics;  Laterality: Right;     Family History  Problem Relation Age of Onset  . Heart attack Mother        CVA, MI  . Diabetes Mother   . Heart attack Father        MI  . Diabetes Sister   . Coronary artery disease Brother        CABG     Social History   Socioeconomic History  . Marital status: Married    Spouse name: Not on file  . Number of children: Not on file  . Years of education: Not on file  . Highest education level: Not on file  Occupational History  . Occupation: Retired    Associate Professor: UNEMPLOYED  Social Needs  . Financial resource strain: Not on file  . Food insecurity:    Worry: Not on file    Inability: Not on file  . Transportation needs:    Medical: Not on file    Non-medical: Not on file  Tobacco Use  . Smoking status: Former Smoker    Packs/day: 1.00    Years: 15.00    Pack years: 15.00    Last attempt to quit: 09/29/1990    Years since quitting: 27.2  . Smokeless tobacco: Former Neurosurgeon    Quit date: 08/09/1988  Substance and Sexual Activity  . Alcohol use: No  . Drug use: No  .  Sexual activity: Not on file  Lifestyle  . Physical activity:    Days per week: Not on file    Minutes per session: Not on file  . Stress: Not on file  Relationships  . Social connections:    Talks on phone: Not on file    Gets together: Not on  file    Attends religious service: Not on file    Active member of club or organization: Not on file    Attends meetings of clubs or organizations: Not on file    Relationship status: Not on file  . Intimate partner violence:    Fear of current or ex partner: Not on file    Emotionally abused: Not on file    Physically abused: Not on file    Forced sexual activity: Not on file  Other Topics Concern  . Not on file  Social History Narrative   Lives in [place] with wife   Been on disability since his stroke in the 1990s   Not routinely exercising     BP 122/70   Pulse 60   Ht 6\' 2"  (1.88 m)   Wt 211 lb 6.4 oz (95.9 kg)   BMI 27.14 kg/m   Physical Exam:  Well appearing NAD HEENT: Unremarkable Neck:  No JVD, no thyromegally Lymphatics:  No adenopathy Back:  No CVA tenderness Lungs:  Clear with no wheezes HEART:  Regular rate rhythm, no murmurs, no rubs, no clicks Abd:  soft, positive bowel sounds, no organomegally, no rebound, no guarding Ext:  2 plus pulses, no edema, no cyanosis, no clubbing Skin:  No rashes no nodules Neuro:  CN II through XII intact, motor grossly intact  EKG - Nsr with old anterior MI  DEVICE  Normal device function.  See PaceArt for details.   Assess/Plan: 1. Chronic systolic heart failure - his symptoms are class 2. He will continue his current meds. 2. ICD - his St. Jude device is working normally. Will recheck in several months. 3. CAD - he is active and denies anginal symptoms. Will follow.  Leonia Reeves.D.

## 2018-01-05 NOTE — Patient Instructions (Signed)
Medication Instructions:  Your physician recommends that you continue on your current medications as directed. Please refer to the Current Medication list given to you today.  Labwork: None ordered.  Testing/Procedures: None ordered.  Follow-Up: Your physician wants you to follow-up in: one year with Dr. Ladona Ridgel.   You will receive a reminder letter in the mail two months in advance. If you don't receive a letter, please call our office to schedule the follow-up appointment.  Remote monitoring is used to monitor your ICD from home. This monitoring reduces the number of office visits required to check your device to one time per year. It allows [place] to keep an eye on the functioning of your device to ensure it is working properly. You are scheduled for a device check from home on 04/02/2018. You may send your transmission at any time that day. If you have a wireless device, the transmission will be sent automatically. After your physician reviews your transmission, you will receive a postcard with your next transmission date.  Any Other Special Instructions Will Be Listed Below (If Applicable).  If you need a refill on your cardiac medications before your next appointment, please call your pharmacy.

## 2018-01-06 ENCOUNTER — Encounter: Payer: Self-pay | Admitting: Cardiology

## 2018-01-18 ENCOUNTER — Other Ambulatory Visit: Payer: Self-pay | Admitting: Internal Medicine

## 2018-01-21 LAB — CUP PACEART REMOTE DEVICE CHECK
Battery Remaining Percentage: 75 %
Brady Statistic AP VS Percent: 77 %
Brady Statistic AS VP Percent: 1 %
Brady Statistic AS VS Percent: 23 %
Brady Statistic RV Percent Paced: 1 %
HighPow Impedance: 65 Ohm
HighPow Impedance: 65 Ohm
Implantable Lead Implant Date: 20090708
Implantable Lead Location: 753859
Implantable Lead Location: 753860
Implantable Lead Model: 7122
Lead Channel Impedance Value: 340 Ohm
Lead Channel Impedance Value: 340 Ohm
Lead Channel Pacing Threshold Amplitude: 0.5 V
Lead Channel Pacing Threshold Amplitude: 1 V
Lead Channel Pacing Threshold Pulse Width: 0.5 ms
Lead Channel Sensing Intrinsic Amplitude: 12 mV
Lead Channel Setting Pacing Amplitude: 2 V
Lead Channel Setting Pacing Amplitude: 2.5 V
Lead Channel Setting Pacing Pulse Width: 0.9 ms
MDC IDC LEAD IMPLANT DT: 20090708
MDC IDC MSMT BATTERY REMAINING LONGEVITY: 65 mo
MDC IDC MSMT BATTERY VOLTAGE: 2.96 V
MDC IDC MSMT LEADCHNL RA SENSING INTR AMPL: 1.9 mV
MDC IDC MSMT LEADCHNL RV PACING THRESHOLD PULSEWIDTH: 0.9 ms
MDC IDC PG IMPLANT DT: 20170106
MDC IDC SESS DTM: 20190404185926
MDC IDC SET LEADCHNL RV SENSING SENSITIVITY: 0.5 mV
MDC IDC STAT BRADY AP VP PERCENT: 1 %
MDC IDC STAT BRADY RA PERCENT PACED: 77 %
Pulse Gen Serial Number: 7306179

## 2018-01-29 ENCOUNTER — Inpatient Hospital Stay: Payer: Medicare HMO | Attending: Oncology

## 2018-01-29 VITALS — BP 137/69 | HR 60 | Resp 18

## 2018-01-29 DIAGNOSIS — E538 Deficiency of other specified B group vitamins: Secondary | ICD-10-CM | POA: Diagnosis present

## 2018-01-29 DIAGNOSIS — D509 Iron deficiency anemia, unspecified: Secondary | ICD-10-CM

## 2018-01-29 MED ORDER — CYANOCOBALAMIN 1000 MCG/ML IJ SOLN
1000.0000 ug | Freq: Once | INTRAMUSCULAR | Status: AC
  Administered 2018-01-29: 1000 ug via INTRAMUSCULAR

## 2018-01-29 MED ORDER — CYANOCOBALAMIN 1000 MCG/ML IJ SOLN
INTRAMUSCULAR | Status: AC
  Filled 2018-01-29: qty 1

## 2018-03-01 ENCOUNTER — Inpatient Hospital Stay: Payer: Medicare HMO | Attending: Oncology

## 2018-03-01 VITALS — BP 137/74 | HR 78 | Temp 98.2°F | Resp 20

## 2018-03-01 DIAGNOSIS — E538 Deficiency of other specified B group vitamins: Secondary | ICD-10-CM | POA: Diagnosis present

## 2018-03-01 DIAGNOSIS — D509 Iron deficiency anemia, unspecified: Secondary | ICD-10-CM

## 2018-03-01 MED ORDER — CYANOCOBALAMIN 1000 MCG/ML IJ SOLN
1000.0000 ug | Freq: Once | INTRAMUSCULAR | Status: AC
  Administered 2018-03-01: 1000 ug via INTRAMUSCULAR

## 2018-03-12 ENCOUNTER — Emergency Department (HOSPITAL_COMMUNITY): Payer: Medicare HMO

## 2018-03-12 ENCOUNTER — Encounter (HOSPITAL_COMMUNITY): Payer: Self-pay | Admitting: Emergency Medicine

## 2018-03-12 ENCOUNTER — Other Ambulatory Visit: Payer: Self-pay

## 2018-03-12 ENCOUNTER — Emergency Department (HOSPITAL_COMMUNITY)
Admission: EM | Admit: 2018-03-12 | Discharge: 2018-03-12 | Disposition: A | Discharge status: Home / Self Care | Payer: Medicare HMO | Attending: Emergency Medicine | Admitting: Emergency Medicine

## 2018-03-12 DIAGNOSIS — Z951 Presence of aortocoronary bypass graft: Secondary | ICD-10-CM | POA: Diagnosis not present

## 2018-03-12 DIAGNOSIS — Z7902 Long term (current) use of antithrombotics/antiplatelets: Secondary | ICD-10-CM | POA: Insufficient documentation

## 2018-03-12 DIAGNOSIS — Z7982 Long term (current) use of aspirin: Secondary | ICD-10-CM | POA: Diagnosis not present

## 2018-03-12 DIAGNOSIS — E119 Type 2 diabetes mellitus without complications: Secondary | ICD-10-CM | POA: Diagnosis not present

## 2018-03-12 DIAGNOSIS — I2510 Atherosclerotic heart disease of native coronary artery without angina pectoris: Secondary | ICD-10-CM | POA: Insufficient documentation

## 2018-03-12 DIAGNOSIS — I110 Hypertensive heart disease with heart failure: Secondary | ICD-10-CM | POA: Diagnosis not present

## 2018-03-12 DIAGNOSIS — Z96652 Presence of left artificial knee joint: Secondary | ICD-10-CM | POA: Diagnosis not present

## 2018-03-12 DIAGNOSIS — I5022 Chronic systolic (congestive) heart failure: Secondary | ICD-10-CM | POA: Insufficient documentation

## 2018-03-12 DIAGNOSIS — Z79899 Other long term (current) drug therapy: Secondary | ICD-10-CM | POA: Diagnosis not present

## 2018-03-12 DIAGNOSIS — Z7984 Long term (current) use of oral hypoglycemic drugs: Secondary | ICD-10-CM | POA: Diagnosis not present

## 2018-03-12 DIAGNOSIS — Z87891 Personal history of nicotine dependence: Secondary | ICD-10-CM | POA: Diagnosis not present

## 2018-03-12 DIAGNOSIS — Z8673 Personal history of transient ischemic attack (TIA), and cerebral infarction without residual deficits: Secondary | ICD-10-CM | POA: Insufficient documentation

## 2018-03-12 DIAGNOSIS — R109 Unspecified abdominal pain: Secondary | ICD-10-CM | POA: Insufficient documentation

## 2018-03-12 LAB — CBC WITH DIFFERENTIAL/PLATELET
BASOS PCT: 0 %
Basophils Absolute: 0 10*3/uL (ref 0.0–0.1)
EOS ABS: 0.2 10*3/uL (ref 0.0–0.7)
Eosinophils Relative: 3 %
HCT: 34.1 % — ABNORMAL LOW (ref 39.0–52.0)
HEMOGLOBIN: 10.8 g/dL — AB (ref 13.0–17.0)
Lymphocytes Relative: 25 %
Lymphs Abs: 1.7 10*3/uL (ref 0.7–4.0)
MCH: 27.1 pg (ref 26.0–34.0)
MCHC: 31.7 g/dL (ref 30.0–36.0)
MCV: 85.5 fL (ref 78.0–100.0)
Monocytes Absolute: 0.8 10*3/uL (ref 0.1–1.0)
Monocytes Relative: 11 %
NEUTROS PCT: 61 %
Neutro Abs: 4.2 10*3/uL (ref 1.7–7.7)
PLATELETS: 207 10*3/uL (ref 150–400)
RBC: 3.99 MIL/uL — AB (ref 4.22–5.81)
RDW: 14.6 % (ref 11.5–15.5)
WBC: 6.9 10*3/uL (ref 4.0–10.5)

## 2018-03-12 LAB — COMPREHENSIVE METABOLIC PANEL
ALBUMIN: 3.6 g/dL (ref 3.5–5.0)
ALK PHOS: 79 U/L (ref 38–126)
ALT: 11 U/L — AB (ref 17–63)
ANION GAP: 9 (ref 5–15)
AST: 14 U/L — ABNORMAL LOW (ref 15–41)
BUN: 20 mg/dL (ref 6–20)
CALCIUM: 8.9 mg/dL (ref 8.9–10.3)
CHLORIDE: 108 mmol/L (ref 101–111)
CO2: 26 mmol/L (ref 22–32)
CREATININE: 1.03 mg/dL (ref 0.61–1.24)
GFR calc Af Amer: >60 mL/min (ref >60)
GFR calc non Af Amer: >60 mL/min (ref >60)
GLUCOSE: 150 mg/dL — AB (ref 65–99)
Potassium: 4.2 mmol/L (ref 3.5–5.1)
SODIUM: 143 mmol/L (ref 135–145)
Total Bilirubin: 0.4 mg/dL (ref 0.3–1.2)
Total Protein: 6.3 g/dL — ABNORMAL LOW (ref 6.5–8.1)

## 2018-03-12 LAB — URINALYSIS, ROUTINE W REFLEX MICROSCOPIC
Bilirubin Urine: NEGATIVE
Glucose, UA: NEGATIVE mg/dL
HGB URINE DIPSTICK: NEGATIVE
Ketones, ur: NEGATIVE mg/dL
Leukocytes, UA: NEGATIVE
Nitrite: NEGATIVE
Protein, ur: NEGATIVE mg/dL
SPECIFIC GRAVITY, URINE: 1.006 (ref 1.005–1.030)
pH: 7 (ref 5.0–8.0)

## 2018-03-12 LAB — LIPASE, BLOOD: Lipase: 22 U/L (ref 11–51)

## 2018-03-12 MED ORDER — ACETAMINOPHEN 325 MG PO TABS
650.0000 mg | ORAL_TABLET | Freq: Once | ORAL | Status: AC
  Administered 2018-03-12: 650 mg via ORAL
  Filled 2018-03-12: qty 2

## 2018-03-12 MED ORDER — METHOCARBAMOL 500 MG PO TABS
500.0000 mg | ORAL_TABLET | Freq: Once | ORAL | Status: AC
Start: 2018-03-12 — End: 2018-03-12
  Administered 2018-03-12: 500 mg via ORAL
  Filled 2018-03-12: qty 1

## 2018-03-12 MED ORDER — METHOCARBAMOL 500 MG PO TABS: 500.0000 mg | ORAL_TABLET | Freq: Three times a day (TID) | ORAL | 0 refills | Status: DC | PRN

## 2018-03-12 NOTE — ED Provider Notes (Signed)
[facility] EMERGENCY DEPARTMENT Provider Note   CSN: [id number] Arrival date & time: 03/12/18  1645     History   Chief Complaint Chief Complaint  Patient presents with  . Flank Pain    HPI [name] is a 72 y.o. male.  HPI Patient presents with acute onset left-sided flank pain that began yesterday.  Has some radiation of the pain to his abdomen.  Denies nausea or vomiting.  Denies fever chills.  Patient does have some urinary hesitancy but denies dysuria or hematuria.  Previously similar symptoms.  No focal weakness or numbness. Past Medical History:  Diagnosis Date  . Anemia    Multifactorial  - hx of B12 def and iron deficiency, followed by heme[facility]  . Automatic implantable cardioverter-defibrillator in situ   . CAD (coronary artery disease)    a. STEMI s/p CABG 07/2007 (had preop cardiogenic shock, IABP, VDRF before surgery)  . Cardiomyopathy, ischemic    a. Chronic systolic CHF s/p St. Jude dual chamber ICD 03/2008.  . Carotid stenosis    a. Prior carotid dz/ surgery. b. Carotid dopp 07/2011 - 0-39% bilaterally.;   c. Doppler 11/13: 40-59% RICA, 0-39% LICA  . Chronic renal insufficiency   . Diabetes mellitus   . GI bleed    Reported history  . Hx of CABG   . Hypertension    [place] [facility]   . Mitral regurgitation   . Myocardial infarction (HCC)   . Neuromuscular disorder (HCC)    R sided weakness   . Osteoarthritis   . PAF (paroxysmal atrial fibrillation) (HCC)    a. Poor Coumadin candidate due to hx of GI bleed.  . Sinus bradycardia   . Sleep apnea    study done, 10 yrs ago, unable to tolerate CPAP  . Splenomegaly   . Stroke [facility])    a. CVA 1990s - chronic pain in RUE after stroke.  [name] Systolic CHF, chronic (HCC)    Previously taken off lisinopril by primary doctor due to labs  . Tobacco abuse     Patient Active Problem List   Diagnosis Date Noted  . Acute appendicitis, uncomplicated 09/14/2017  . Dry eye  02/20/2016  . CHF (congestive heart failure) (HCC)   . Elevated LFTs 02/13/2016  . Hypokalemia 02/13/2016  . Chronic combined systolic and diastolic CHF (congestive heart failure) (HCC) 02/13/2016  . Prolonged Q-T interval on ECG 02/13/2016  . Biliary colic 02/13/2016  . Left ear pain 02/13/2016  . Thrombocytopenia (HCC) 02/13/2016  . Right knee pain 10/13/2013  . Abnormality of gait 10/13/2013  . Stiffness of right knee 10/13/2013  . Arthritis of knee, right 08/16/2013  . Cholecystitis with cholelithiasis 03/07/2013  . Automatic implantable cardioverter-defibrillator in situ 03/13/2009  . DM (diabetes mellitus), secondary, with peripheral vascular complications (HCC) 03/12/2009  . Iron deficiency anemia 03/12/2009  . Essential hypertension 03/12/2009  . CARDIOMYOPATHY, ISCHEMIC 03/12/2009  . CEREBROVASCULAR DISEASE 03/12/2009  . PVD 03/12/2009  . RENAL INSUFFICIENCY, CHRONIC 03/12/2009  . OSTEOARTHRITIS 03/12/2009  . CORONARY ARTERY BYPASS GRAFT, HX OF 03/12/2009    Past Surgical History:  Procedure Laterality Date  . CARDIAC DEFIBRILLATOR PLACEMENT  04/05/2008   Implantation of a St. Jude dual-chamber defibrillator, Doylene Canning. Ladona Ridgel , MD  . CARDIAC DEFIBRILLATOR PLACEMENT    . CHOLECYSTECTOMY N/A 02/19/2016   Procedure: LAPAROSCOPIC CHOLECYSTECTOMY;  Surgeon: Abigail Miyamoto, MD;  [facility];  Service: General;  Laterality: N/A;  . COLONOSCOPY  07/2007  Rourk: left-sided diverticulum. TI normal. No etiology for IDA.  [name] CORONARY ARTERY BYPASS GRAFT    . CORONARY ARTERY BYPASS GRAFT  08/29/2007   x3; Marilu Favre H. Cornelius Moras MD  . EP IMPLANTABLE DEVICE N/A 10/05/2015   Procedure: ICD Generator Changeout;  Surgeon: Marinus Maw, MD;  Location: [facility] INVASIVE CV LAB;  Service: Cardiovascular;  Laterality: N/A;  . ERCP N/A 02/18/2016   Procedure: ENDOSCOPIC RETROGRADE CHOLANGIOPANCREATOGRAPHY (ERCP);  Surgeon: Vida Rigger, MD;  Location: [facility] ENDOSCOPY;  Service: Endoscopy;  Laterality:  N/A;  . ESOPHAGOGASTRODUODENOSCOPY  07/2007   Rourk: small hh, atrophic gastric mucosa, poor distensibility of stomach ?extrinsic compression, CT showed mild to moderate splenomegaly. No etiology for IDA.  [name] JOINT REPLACEMENT  1990's   L knee  . LAPAROSCOPIC APPENDECTOMY N/A 09/14/2017   Procedure: APPENDECTOMY LAPAROSCOPIC;  Surgeon: Jimmye Norman, MD;  Location: [facility] OR;  Service: General;  Laterality: N/A;  . Percutaneous coronary intervention  01/10/2005   Charlies Constable, MD  . TONSILLECTOMY    . TOTAL KNEE ARTHROPLASTY Right 08/16/2013   Procedure: RIGHT TOTAL KNEE ARTHROPLASTY;  Surgeon: Kathryne Hitch, MD;  Location: [facility] OR;  Service: Orthopedics;  Laterality: Right;        Home Medications    Prior to Admission medications   Medication Sig Start Date End Date Taking? Authorizing Provider  aspirin 81 MG EC tablet Take 1 tablet (81 mg total) by mouth daily. 09/16/17   Lenox Ponds, MD  atorvastatin (LIPITOR) 80 MG tablet Take 0.5 tablets (40 mg total) by mouth every other day. 07/20/14   Marinus Maw, MD  baclofen (LIORESAL) 20 MG tablet Take 20 mg by mouth 2 (two) times daily.     [provider]  Canagliflozin (INVOKANA) 300 MG TABS Take 1 tablet by mouth daily.    [provider]  carvedilol (COREG) 12.5 MG tablet Take 1 tablet (12.5 mg total) by mouth 2 (two) times daily with a meal. 11/03/14   Laqueta Linden, MD  clopidogrel (PLAVIX) 75 MG tablet Take 1 tablet (75 mg total) by mouth daily. 09/16/17   Lenox Ponds, MD  furosemide (LASIX) 40 MG tablet Take 40 mg by mouth daily as needed for fluid.     [provider]  linagliptin (TRADJENTA) 5 MG TABS tablet Take 5 mg by mouth daily after breakfast.     [provider]  meclizine (ANTIVERT) 25 MG tablet Take 25 mg by mouth 4 (four) times daily as needed. For dizziness 02/26/12   [provider]  methocarbamol (ROBAXIN) 500 MG tablet Take 1 tablet (500 mg total)  by mouth every 8 (eight) hours as needed for muscle spasms. 03/12/18   Loren Racer, MD  oxyCODONE-acetaminophen (PERCOCET) 10-325 MG per tablet Take 1 tablet by mouth every 4 (four) hours as needed for pain.  01/26/14   [provider]  polyethylene glycol (MIRALAX / GLYCOLAX) packet Take 17 g by mouth daily. 02/22/16   Rolly Salter, MD  SENNA CO Take 1 tablet by mouth daily as needed (constipation).  01/10/14   [provider]  spironolactone (ALDACTONE) 25 MG tablet Take 1 tablet (25 mg total) by mouth every morning. 07/20/14   Marinus Maw, MD    Family History Family History  Problem Relation Age of Onset  . Heart attack Mother        CVA, MI  . Diabetes Mother   . Heart attack Father        MI  .  Diabetes Sister   . Coronary artery disease Brother        CABG    Social History Social History   Tobacco Use  . Smoking status: Former Smoker    Packs/day: 1.00    Years: 15.00    Pack years: 15.00    Last attempt to quit: 09/29/1990    Years since quitting: 27.4  . Smokeless tobacco: Former Neurosurgeon    Quit date: 08/09/1988  Substance Use Topics  . Alcohol use: No  . Drug use: No     Allergies   Codeine   Review of Systems Review of Systems  Constitutional: Negative for chills and fever.  HENT: Negative for congestion and sore throat.   Respiratory: Negative for cough, shortness of breath and wheezing.   Cardiovascular: Negative for chest pain, palpitations and leg swelling.  Gastrointestinal: Positive for abdominal pain. Negative for constipation, diarrhea, nausea and vomiting.  Genitourinary: Positive for difficulty urinating and flank pain. Negative for dysuria, frequency and hematuria.  Musculoskeletal: Positive for back pain. Negative for neck pain and neck stiffness.  Skin: Negative for rash and wound.  Neurological: Negative for dizziness, weakness, light-headedness, numbness and headaches.  All other systems reviewed and are  negative.    Physical Exam Updated Vital Signs BP (!) 156/81 (BP Location: Left Arm)   Pulse 60   Temp 98.2 F (36.8 C) (Oral)   Resp 20   Ht 6\' 1"  (1.854 m)   Wt 95.3 kg (210 lb)   SpO2 100%   BMI 27.71 kg/m   Physical Exam  Constitutional: He is oriented to person, place, and time. He appears well-developed and well-nourished. No distress.  HENT:  Head: Normocephalic and atraumatic.  Mouth/Throat: Oropharynx is clear and moist.  Eyes: Pupils are equal, round, and reactive to light. EOM are normal.  Neck: Normal range of motion. Neck supple.  Cardiovascular: Normal rate and regular rhythm. Exam reveals no gallop and no friction rub.  No murmur heard. Pulmonary/Chest: Effort normal and breath sounds normal. No stridor. No respiratory distress. He has no wheezes. He has no rales. He exhibits no tenderness.  Abdominal: Soft. Bowel sounds are normal. There is tenderness. There is no rebound and no guarding.  Very mild tenderness to deep palpation in the left upper and left lower quadrants.  No rebound or guarding.  Musculoskeletal: Normal range of motion. He exhibits no edema or tenderness.  No posterior midline thoracic or lumbar tenderness to palpation.  No CVA tenderness.  Mild paralumbar muscular TTP. Negative straight leg raise bilaterally.  No lower extremity swelling, asymmetry or tenderness.  Distal pulses intact.  Neurological: He is alert and oriented to person, place, and time.  Moves all extremities without focal deficit.  Sensation fully intact.  Skin: Skin is warm and dry. Capillary refill takes less than 2 seconds. No rash noted. He is not diaphoretic. No erythema.  Psychiatric: He has a normal mood and affect. His behavior is normal.  Nursing note and vitals reviewed.    ED Treatments / Results  Labs (all labs ordered are listed, but only abnormal results are displayed) Labs Reviewed  CBC WITH DIFFERENTIAL/PLATELET - Abnormal; Notable for the following  components:      Result Value   RBC 3.99 (*)    Hemoglobin 10.8 (*)    HCT 34.1 (*)    All other components within normal limits  COMPREHENSIVE METABOLIC PANEL - Abnormal; Notable for the following components:   Glucose, Bld 150 (*)  Total Protein 6.3 (*)    AST 14 (*)    ALT 11 (*)    All other components within normal limits  URINALYSIS, ROUTINE W REFLEX MICROSCOPIC - Abnormal; Notable for the following components:   Color, Urine STRAW (*)    All other components within normal limits  LIPASE, BLOOD    EKG None  Radiology Ct Renal Stone Study  Result Date: 03/12/2018 CLINICAL DATA:  Left flank pain.  Rule out stone EXAM: CT ABDOMEN AND PELVIS WITHOUT CONTRAST TECHNIQUE: Multidetector CT imaging of the abdomen and pelvis was performed following the standard protocol without IV contrast. COMPARISON:  CT abdomen pelvis 09/14/2017 FINDINGS: Lower chest: Lung bases clear.  Cardiac enlargement.  Pacemaker. Hepatobiliary: Cholecystectomy. Mild pneumobilia. No biliary dilatation. Soft tissue structure between the right tenth and eleventh ribs. This appears extra hepatic and is unchanged from the prior study. The lesion measures 2 x 4 cm and is unchanged in size. Pancreas: Fatty pancreas without edema or mass. Spleen: Negative Adrenals/Urinary Tract: Negative for urinary tract calculi. No renal mass or obstruction. No bladder which is empty. Stomach/Bowel: Appendectomy. Negative for bowel obstruction. No bowel mass or edema. Negative for diverticulitis. Vascular/Lymphatic: Atherosclerotic disease, mild. Negative for aneurysm. Reproductive: Mild prostate enlargement Other: No free fluid. Musculoskeletal: Lumbar spine degenerative changes. No acute skeletal abnormality. IMPRESSION: 1. Postop appendectomy. No acute abnormality in the abdomen. No renal calculi 2. 2 x 4 cm soft tissue structure right posterior flank is unchanged from 09/14/2017. Uncertain etiology . Possible chronic subcapsular  hematoma. Electronically Signed   By: Marlan Palau M.D.   On: 03/12/2018 18:55    Procedures Procedures (including critical care time)  Medications Ordered in ED Medications  acetaminophen (TYLENOL) tablet 650 mg (650 mg Oral Given 03/12/18 2007)  methocarbamol (ROBAXIN) tablet 500 mg (500 mg Oral Given 03/12/18 2009)     Initial Impression / Assessment and Plan / ED Course  I have reviewed the triage vital signs and the nursing notes.  Pertinent labs & imaging results that were available during my care of the patient were reviewed by me and considered in my medical decision making (see chart for details).     CT renal stone study without acute findings.  No evidence of urinary tract infection or hematuria.  Normal neurologic exam.  Question muscular strain.  Will treat symptomatically.  Advised to follow-up with primary physician and return precautions given.  Final Clinical Impressions(s) / ED Diagnoses   Final diagnoses:  Left flank pain    ED Discharge Orders        Ordered    methocarbamol (ROBAXIN) 500 MG tablet  Every 8 hours PRN     03/12/18 2029       Loren Racer, MD 03/12/18 2054

## 2018-03-12 NOTE — ED Triage Notes (Signed)
Patient complaining of left flank pain since yesterday. Denies dysuria. 

## 2018-03-31 ENCOUNTER — Inpatient Hospital Stay: Payer: Medicare HMO | Attending: Oncology

## 2018-03-31 VITALS — BP 143/71 | HR 81 | Temp 97.6°F | Resp 20

## 2018-03-31 DIAGNOSIS — D509 Iron deficiency anemia, unspecified: Secondary | ICD-10-CM

## 2018-03-31 DIAGNOSIS — E538 Deficiency of other specified B group vitamins: Secondary | ICD-10-CM | POA: Insufficient documentation

## 2018-03-31 MED ORDER — CYANOCOBALAMIN 1000 MCG/ML IJ SOLN
1000.0000 ug | Freq: Once | INTRAMUSCULAR | Status: AC
  Administered 2018-03-31: 1000 ug via INTRAMUSCULAR

## 2018-03-31 NOTE — Patient Instructions (Signed)

## 2018-04-02 ENCOUNTER — Ambulatory Visit (INDEPENDENT_AMBULATORY_CARE_PROVIDER_SITE_OTHER): Payer: Medicare HMO | Admitting: *Deleted

## 2018-04-02 ENCOUNTER — Telehealth: Payer: Self-pay | Admitting: Cardiology

## 2018-04-02 DIAGNOSIS — I255 Ischemic cardiomyopathy: Secondary | ICD-10-CM | POA: Diagnosis not present

## 2018-04-02 DIAGNOSIS — I5022 Chronic systolic (congestive) heart failure: Secondary | ICD-10-CM

## 2018-04-02 NOTE — Progress Notes (Signed)
Remote ICD transmission.   

## 2018-04-02 NOTE — Telephone Encounter (Signed)
Spoke with pt and reminded pt of remote transmission that is due today. Pt verbalized understanding.   

## 2018-04-29 LAB — CUP PACEART REMOTE DEVICE CHECK
Battery Remaining Longevity: 62 mo
Battery Voltage: 2.96 V
Brady Statistic AP VS Percent: 76 %
Brady Statistic AS VP Percent: 1 %
Brady Statistic RA Percent Paced: 77 %
Date Time Interrogation Session: 20190705143311
HIGH POWER IMPEDANCE MEASURED VALUE: 62 Ohm
HighPow Impedance: 62 Ohm
Implantable Lead Location: 753859
Implantable Lead Model: 7122
Implantable Pulse Generator Implant Date: 20170106
Lead Channel Impedance Value: 340 Ohm
Lead Channel Pacing Threshold Amplitude: 1 V
Lead Channel Sensing Intrinsic Amplitude: 12 mV
Lead Channel Setting Pacing Amplitude: 2 V
Lead Channel Setting Pacing Pulse Width: 0.9 ms
MDC IDC LEAD IMPLANT DT: 20090708
MDC IDC LEAD IMPLANT DT: 20090708
MDC IDC LEAD LOCATION: 753860
MDC IDC MSMT BATTERY REMAINING PERCENTAGE: 72 %
MDC IDC MSMT LEADCHNL RA PACING THRESHOLD AMPLITUDE: 0.5 V
MDC IDC MSMT LEADCHNL RA PACING THRESHOLD PULSEWIDTH: 0.5 ms
MDC IDC MSMT LEADCHNL RA SENSING INTR AMPL: 2.1 mV
MDC IDC MSMT LEADCHNL RV IMPEDANCE VALUE: 340 Ohm
MDC IDC MSMT LEADCHNL RV PACING THRESHOLD PULSEWIDTH: 0.9 ms
MDC IDC SET LEADCHNL RV PACING AMPLITUDE: 2.5 V
MDC IDC SET LEADCHNL RV SENSING SENSITIVITY: 0.5 mV
MDC IDC STAT BRADY AP VP PERCENT: 1 %
MDC IDC STAT BRADY AS VS PERCENT: 23 %
MDC IDC STAT BRADY RV PERCENT PACED: 1 %
Pulse Gen Serial Number: 7306179

## 2018-05-03 ENCOUNTER — Inpatient Hospital Stay: Payer: Medicare HMO | Attending: Oncology

## 2018-05-03 DIAGNOSIS — D509 Iron deficiency anemia, unspecified: Secondary | ICD-10-CM

## 2018-05-03 DIAGNOSIS — E538 Deficiency of other specified B group vitamins: Secondary | ICD-10-CM | POA: Diagnosis not present

## 2018-05-03 MED ORDER — CYANOCOBALAMIN 1000 MCG/ML IJ SOLN
INTRAMUSCULAR | Status: AC
  Filled 2018-05-03: qty 1

## 2018-05-03 MED ORDER — CYANOCOBALAMIN 1000 MCG/ML IJ SOLN
1000.0000 ug | Freq: Once | INTRAMUSCULAR | Status: AC
  Administered 2018-05-03: 1000 ug via INTRAMUSCULAR

## 2018-05-03 NOTE — Patient Instructions (Signed)

## 2018-06-03 ENCOUNTER — Inpatient Hospital Stay: Payer: Medicare HMO | Attending: Oncology

## 2018-06-03 ENCOUNTER — Inpatient Hospital Stay: Payer: Medicare HMO

## 2018-06-03 VITALS — BP 132/77 | HR 60 | Temp 97.8°F | Resp 20

## 2018-06-03 DIAGNOSIS — E538 Deficiency of other specified B group vitamins: Secondary | ICD-10-CM

## 2018-06-03 DIAGNOSIS — D509 Iron deficiency anemia, unspecified: Secondary | ICD-10-CM | POA: Diagnosis not present

## 2018-06-03 LAB — CBC WITH DIFFERENTIAL (CANCER CENTER ONLY)
BASOS PCT: 3 %
Basophils Absolute: 0.2 10*3/uL — ABNORMAL HIGH (ref 0.0–0.1)
Eosinophils Absolute: 0.2 10*3/uL (ref 0.0–0.5)
Eosinophils Relative: 4 %
HCT: 35.3 % — ABNORMAL LOW (ref 38.4–49.9)
Hemoglobin: 11.2 g/dL — ABNORMAL LOW (ref 13.0–17.1)
LYMPHS PCT: 21 %
Lymphs Abs: 1.3 10*3/uL (ref 0.9–3.3)
MCH: 25.1 pg — ABNORMAL LOW (ref 27.2–33.4)
MCHC: 31.7 g/dL — AB (ref 32.0–36.0)
MCV: 79.2 fL — ABNORMAL LOW (ref 79.3–98.0)
Monocytes Absolute: 0.6 10*3/uL (ref 0.1–0.9)
Monocytes Relative: 10 %
NEUTROS ABS: 3.9 10*3/uL (ref 1.5–6.5)
Neutrophils Relative %: 62 %
Platelet Count: 189 10*3/uL (ref 140–400)
RBC: 4.46 MIL/uL (ref 4.20–5.82)
RDW: 16 % — ABNORMAL HIGH (ref 11.0–14.6)
WBC: 6.2 10*3/uL (ref 4.0–10.3)

## 2018-06-03 LAB — VITAMIN B12: Vitamin B-12: 347 pg/mL (ref 180–914)

## 2018-06-03 LAB — IRON AND TIBC
IRON: 28 ug/dL — AB (ref 42–163)
Saturation Ratios: 7 % — ABNORMAL LOW (ref 42–163)
TIBC: 398 ug/dL (ref 202–409)
UIBC: 370 ug/dL

## 2018-06-03 LAB — FERRITIN: Ferritin: 17 ng/mL — ABNORMAL LOW (ref 24–336)

## 2018-06-03 MED ORDER — CYANOCOBALAMIN 1000 MCG/ML IJ SOLN
INTRAMUSCULAR | Status: AC
Start: 2018-06-03 — End: ?
  Filled 2018-06-03: qty 1

## 2018-06-03 MED ORDER — CYANOCOBALAMIN 1000 MCG/ML IJ SOLN
1000.0000 ug | Freq: Once | INTRAMUSCULAR | Status: AC
  Administered 2018-06-03: 1000 ug via INTRAMUSCULAR

## 2018-06-03 NOTE — Patient Instructions (Signed)
Cyanocobalamin, Vitamin B12 injection What is this medicine? CYANOCOBALAMIN (sye an oh koe BAL a min) is a man made form of vitamin B12. Vitamin B12 is used in the growth of healthy blood cells, nerve cells, and proteins in the body. It also helps with the metabolism of fats and carbohydrates. This medicine is used to treat people who can not absorb vitamin B12. This medicine may be used for other purposes; ask your health care provider or pharmacist if you have questions. COMMON BRAND NAME(S): B-12 Compliance Kit, B-12 Injection Kit, Cyomin, LA-12, Nutri-Twelve, Physicians EZ Use B-12, Primabalt What should I tell my health care provider before I take this medicine? They need to know if you have any of these conditions: -kidney disease -Leber's disease -megaloblastic anemia -an unusual or allergic reaction to cyanocobalamin, cobalt, other medicines, foods, dyes, or preservatives -pregnant or trying to get pregnant -breast-feeding How should I use this medicine? This medicine is injected into a muscle or deeply under the skin. It is usually given by a health care professional in a clinic or doctor's office. However, your doctor may teach you how to inject yourself. Follow all instructions. Talk to your pediatrician regarding the use of this medicine in children. Special care may be needed. Overdosage: If you think you have taken too much of this medicine contact a poison control center or emergency room at once. NOTE: This medicine is only for you. Do not share this medicine with others. What if I miss a dose? If you are given your dose at a clinic or doctor's office, call to reschedule your appointment. If you give your own injections and you miss a dose, take it as soon as you can. If it is almost time for your next dose, take only that dose. Do not take double or extra doses. What may interact with this medicine? -colchicine -heavy alcohol intake This list may not describe all possible  interactions. Give your health care provider a list of all the medicines, herbs, non-prescription drugs, or dietary supplements you use. Also tell them if you smoke, drink alcohol, or use illegal drugs. Some items may interact with your medicine. What should I watch for while using this medicine? Visit your doctor or health care professional regularly. You may need blood work done while you are taking this medicine. You may need to follow a special diet. Talk to your doctor. Limit your alcohol intake and avoid smoking to get the best benefit. What side effects may I notice from receiving this medicine? Side effects that you should report to your doctor or health care professional as soon as possible: -allergic reactions like skin rash, itching or hives, swelling of the face, lips, or tongue -blue tint to skin -chest tightness, pain -difficulty breathing, wheezing -dizziness -red, swollen painful area on the leg Side effects that usually do not require medical attention (report to your doctor or health care professional if they continue or are bothersome): -diarrhea -headache This list may not describe all possible side effects. Call your doctor for medical advice about side effects. You may report side effects to FDA at 1-800-FDA-1088. Where should I keep my medicine? Keep out of the reach of children. Store at room temperature between 15 and 30 degrees C (59 and 85 degrees F). Protect from light. Throw away any unused medicine after the expiration date. NOTE: This sheet is a summary. It may not cover all possible information. If you have questions about this medicine, talk to your doctor, pharmacist, or   health care provider.  2018 Elsevier/Gold Standard (2007-12-27 22:10:20)  

## 2018-06-03 NOTE — Progress Notes (Signed)
Pt. Tolerated injection well, no further problems or concerns noted.

## 2018-07-02 ENCOUNTER — Telehealth: Payer: Self-pay | Admitting: Oncology

## 2018-07-02 ENCOUNTER — Inpatient Hospital Stay: Payer: Medicare HMO | Admitting: Oncology

## 2018-07-02 ENCOUNTER — Inpatient Hospital Stay: Payer: Medicare HMO | Attending: Oncology

## 2018-07-02 VITALS — BP 144/69 | HR 64 | Temp 97.9°F | Resp 17 | Ht 73.0 in | Wt 208.9 lb

## 2018-07-02 DIAGNOSIS — E538 Deficiency of other specified B group vitamins: Secondary | ICD-10-CM

## 2018-07-02 DIAGNOSIS — K909 Intestinal malabsorption, unspecified: Secondary | ICD-10-CM | POA: Diagnosis not present

## 2018-07-02 DIAGNOSIS — Z79899 Other long term (current) drug therapy: Secondary | ICD-10-CM | POA: Diagnosis not present

## 2018-07-02 DIAGNOSIS — D509 Iron deficiency anemia, unspecified: Secondary | ICD-10-CM | POA: Diagnosis not present

## 2018-07-02 DIAGNOSIS — Z7982 Long term (current) use of aspirin: Secondary | ICD-10-CM | POA: Diagnosis not present

## 2018-07-02 MED ORDER — CYANOCOBALAMIN 1000 MCG/ML IJ SOLN
1000.0000 ug | Freq: Once | INTRAMUSCULAR | Status: AC
  Administered 2018-07-02: 1000 ug via INTRAMUSCULAR

## 2018-07-02 MED ORDER — CYANOCOBALAMIN 1000 MCG/ML IJ SOLN
INTRAMUSCULAR | Status: AC
  Filled 2018-07-02: qty 1

## 2018-07-02 NOTE — Telephone Encounter (Signed)
Appointments scheduled AVS/Calendar printed per 10/4 los

## 2018-07-02 NOTE — Progress Notes (Signed)
[name] 07/02/18   [name], DO [address] [place] [address]  DIAGNOSIS: 72 year old man with:  1. Iron deficiency anemia due to lack of oral absorption of iron noted in 2008.   2. B12 deficiency diagnosed in 2008.   CURRENT THERAPY:   B-12 1000 mcg IM injection on a monthly basis.  Intermittent IV iron infusion utilizing Feraheme.  Last infusion was in April 2018.  INTERVAL HISTORY:  [name] returns today for repeat evaluation.  Since last visit, he reports no major changes in his health or any recent complaints.  He remains active and attends to activities of daily living.  He does report some mild fatigue without any hematochezia or melena.  He continues to tolerate B12 injections without any issues or complaints.  He does report improvement in his energy level with B12 injections.   He does not report any headaches, blurry vision, syncope or seizures.  He denies any alteration of mental status or dizziness.  He does not report any fevers, chills, sweats or weight loss. He is not report any chest pain, palpitation, orthopnea. He does not report any cough, wheezing or hemoptysis. He does not report any nausea, vomiting, abdominal pain.  He denies any changes in bowel habits.  He does not report any frequency urgency or hesitancy.  He denies any bone pain or pathological fractures.  He denies any skin rashes or lesions.  He denies any lymphadenopathy or petechiae.  He denies any bleeding or clotting tendency.  Remainder review of systems is negative.   ALLERGIES:  is allergic to codeine.  MEDICATIONS:   Current Outpatient Medications  Medication Sig Dispense Refill  . aspirin 81 MG EC tablet Take 1 tablet (81 mg total) by mouth daily. 30 tablet 12  . atorvastatin (LIPITOR) 80 MG tablet Take 0.5 tablets (40 mg total) by mouth every other day. 90 tablet 3  . baclofen (LIORESAL) 20 MG tablet Take 20 mg by mouth 2  (two) times daily.     . Canagliflozin (INVOKANA) 300 MG TABS Take 1 tablet by mouth daily.    . carvedilol (COREG) 12.5 MG tablet Take 1 tablet (12.5 mg total) by mouth 2 (two) times daily with a meal. 180 tablet 3  . clopidogrel (PLAVIX) 75 MG tablet Take 1 tablet (75 mg total) by mouth daily. 90 tablet 3  . furosemide (LASIX) 40 MG tablet Take 40 mg by mouth daily as needed for fluid.     [name] linagliptin (TRADJENTA) 5 MG TABS tablet Take 5 mg by mouth daily after breakfast.     . meclizine (ANTIVERT) 25 MG tablet Take 25 mg by mouth 4 (four) times daily as needed. For dizziness    . methocarbamol (ROBAXIN) 500 MG tablet Take 1 tablet (500 mg total) by mouth every 8 (eight) hours as needed for muscle spasms. 30 tablet 0  . oxyCODONE-acetaminophen (PERCOCET) 10-325 MG per tablet Take 1 tablet by mouth every 4 (four) hours as needed for pain.     . polyethylene glycol (MIRALAX / GLYCOLAX) packet Take 17 g by mouth daily. 14 each 0  . SENNA CO Take 1 tablet by mouth daily as needed (constipation).     [name] spironolactone (ALDACTONE) 25 MG tablet Take 1 tablet (25 mg total) by mouth every morning. 90 tablet 3   No current facility-administered medications for this visit.      PHYSICAL EXAMINATION: ECOG PERFORMANCE STATUS: 1  General appearance: Comfortable appearing without any discomfort Head: Normocephalic without any trauma Oropharynx: Mucous membranes are moist and pink without any thrush or ulcers. Eyes: Pupils are equal and round reactive to light. Lymph nodes: No cervical, supraclavicular, inguinal or axillary lymphadenopathy.   Heart:regular rate and rhythm.  S1 and S2 without leg edema. Lung: Clear without any rhonchi or wheezes.  No dullness to percussion. Abdomin: Soft, nontender, nondistended with good bowel sounds.  No hepatosplenomegaly. Musculoskeletal: No joint deformity or effusion.  Full range of motion noted. Neurological: No deficits noted on motor, sensory and deep tendon  reflex exam. Skin: No petechial rash or dryness.  Appeared moist.      Labs:  Lab Results  Component Value Date   WBC 6.2 06/03/2018   HGB 11.2 (L) 06/03/2018   HCT 35.3 (L) 06/03/2018   MCV 79.2 (L) 06/03/2018   PLT 189 06/03/2018   NEUTROABS 3.9 06/03/2018      Chemistry      Component Value Date/Time   NA 143 03/12/2018 1723   NA 141 06/02/2017 0952   K 4.2 03/12/2018 1723   K 4.0 06/02/2017 0952   CL 108 03/12/2018 1723   CL 100 01/06/2013 0830   CO2 26 03/12/2018 1723   CO2 28 06/02/2017 0952   BUN 20 03/12/2018 1723   BUN 15.7 06/02/2017 0952   CREATININE 1.03 03/12/2018 1723   CREATININE 0.9 06/02/2017 0952      Component Value Date/Time   CALCIUM 8.9 03/12/2018 1723   CALCIUM 9.4 06/02/2017 0952   ALKPHOS 79 03/12/2018 1723   ALKPHOS 81 06/02/2017 0952   AST 14 (L) 03/12/2018 1723   AST 13 06/02/2017 0952   ALT 11 (L) 03/12/2018 1723   ALT 9 06/02/2017 0952   BILITOT 0.4 03/12/2018 1723   BILITOT 0.46 06/02/2017 0952     Results for [name], [name] (MRN [id number]) as of 07/02/2018 09:10  Ref. Range 06/03/2018 08:36  Iron Latest Ref Range: 42 - 163 ug/dL 28 (L)  UIBC Latest Units: ug/dL 563  TIBC Latest Ref Range: 202 - 409 ug/dL 875  Saturation Ratios Latest Ref Range: 42 - 163 % 7 (L)  Ferritin Latest Ref Range: 24 - 336 ng/mL 17 (L)    Assessment and plan:    72 year old gentleman with the following issues:   1.vitamin B-12 deficiency diagnosed in 2008 related to poor absorption.  He continues to receive B12 injections without any complications.  His B12 level remains adequately maintained at 347.  Risks and benefits of continuing this treatment was reviewed today and is agreeable to continue.  2. Iron deficiency anemia: Diagnosed in 2008 related to poor oral iron absorption.  Iron studies obtained on 06/03/2018 showed a decrease in his ferritin and iron levels.  He has received intravenous iron in the past and has tolerated it very well.  Risks  and benefits of repeat Feraheme infusion was reviewed today.  These complications include arthralgias, myalgias as well as infusion related complications.  After discussion today he is agreeable to proceed in the near future.  3. Follow-up: Will be in 6 months.    15  minutes was spent with the patient face-to-face today.  More than 50% of time was dedicated to reviewing the natural course of his disease, treatment options and coordinating plan of care.  Eli Hose MD 07/02/18

## 2018-07-02 NOTE — Patient Instructions (Signed)
Cyanocobalamin, Vitamin B12 injection What is this medicine? CYANOCOBALAMIN (sye an oh koe BAL a min) is a man made form of vitamin B12. Vitamin B12 is used in the growth of healthy blood cells, nerve cells, and proteins in the body. It also helps with the metabolism of fats and carbohydrates. This medicine is used to treat people who can not absorb vitamin B12. This medicine may be used for other purposes; ask your health care provider or pharmacist if you have questions. COMMON BRAND NAME(S): B-12 Compliance Kit, B-12 Injection Kit, Cyomin, LA-12, Nutri-Twelve, Physicians EZ Use B-12, Primabalt What should I tell my health care provider before I take this medicine? They need to know if you have any of these conditions: -kidney disease -Leber's disease -megaloblastic anemia -an unusual or allergic reaction to cyanocobalamin, cobalt, other medicines, foods, dyes, or preservatives -pregnant or trying to get pregnant -breast-feeding How should I use this medicine? This medicine is injected into a muscle or deeply under the skin. It is usually given by a health care professional in a clinic or doctor's office. However, your doctor may teach you how to inject yourself. Follow all instructions. Talk to your pediatrician regarding the use of this medicine in children. Special care may be needed. Overdosage: If you think you have taken too much of this medicine contact a poison control center or emergency room at once. NOTE: This medicine is only for you. Do not share this medicine with others. What if I miss a dose? If you are given your dose at a clinic or doctor's office, call to reschedule your appointment. If you give your own injections and you miss a dose, take it as soon as you can. If it is almost time for your next dose, take only that dose. Do not take double or extra doses. What may interact with this medicine? -colchicine -heavy alcohol intake This list may not describe all possible  interactions. Give your health care provider a list of all the medicines, herbs, non-prescription drugs, or dietary supplements you use. Also tell them if you smoke, drink alcohol, or use illegal drugs. Some items may interact with your medicine. What should I watch for while using this medicine? Visit your doctor or health care professional regularly. You may need blood work done while you are taking this medicine. You may need to follow a special diet. Talk to your doctor. Limit your alcohol intake and avoid smoking to get the best benefit. What side effects may I notice from receiving this medicine? Side effects that you should report to your doctor or health care professional as soon as possible: -allergic reactions like skin rash, itching or hives, swelling of the face, lips, or tongue -blue tint to skin -chest tightness, pain -difficulty breathing, wheezing -dizziness -red, swollen painful area on the leg Side effects that usually do not require medical attention (report to your doctor or health care professional if they continue or are bothersome): -diarrhea -headache This list may not describe all possible side effects. Call your doctor for medical advice about side effects. You may report side effects to FDA at 1-800-FDA-1088. Where should I keep my medicine? Keep out of the reach of children. Store at room temperature between 15 and 30 degrees C (59 and 85 degrees F). Protect from light. Throw away any unused medicine after the expiration date. NOTE: This sheet is a summary. It may not cover all possible information. If you have questions about this medicine, talk to your doctor, pharmacist, or   health care provider.  2018 Elsevier/Gold Standard (2007-12-27 22:10:20)  

## 2018-07-05 ENCOUNTER — Ambulatory Visit (INDEPENDENT_AMBULATORY_CARE_PROVIDER_SITE_OTHER): Payer: Medicare HMO | Admitting: *Deleted

## 2018-07-05 DIAGNOSIS — I5022 Chronic systolic (congestive) heart failure: Secondary | ICD-10-CM

## 2018-07-05 DIAGNOSIS — I255 Ischemic cardiomyopathy: Secondary | ICD-10-CM | POA: Diagnosis not present

## 2018-07-05 NOTE — Progress Notes (Signed)
Remote ICD transmission.   

## 2018-07-07 ENCOUNTER — Inpatient Hospital Stay: Payer: Medicare HMO

## 2018-07-07 VITALS — BP 144/74 | HR 70 | Temp 97.9°F | Resp 16

## 2018-07-07 DIAGNOSIS — D509 Iron deficiency anemia, unspecified: Secondary | ICD-10-CM

## 2018-07-07 LAB — CBC WITH DIFFERENTIAL (CANCER CENTER ONLY)
Abs Immature Granulocytes: 0.02 10*3/uL (ref 0.00–0.07)
Basophils Absolute: 0 10*3/uL (ref 0.0–0.1)
Basophils Relative: 0 %
EOS ABS: 0.2 10*3/uL (ref 0.0–0.5)
EOS PCT: 3 %
HEMATOCRIT: 38.6 % — AB (ref 39.0–52.0)
Hemoglobin: 11.8 g/dL — ABNORMAL LOW (ref 13.0–17.0)
Immature Granulocytes: 0 %
LYMPHS ABS: 1.7 10*3/uL (ref 0.7–4.0)
LYMPHS PCT: 25 %
MCH: 24.5 pg — AB (ref 26.0–34.0)
MCHC: 30.6 g/dL (ref 30.0–36.0)
MCV: 80.2 fL (ref 80.0–100.0)
MONO ABS: 0.7 10*3/uL (ref 0.1–1.0)
Monocytes Relative: 11 %
Neutro Abs: 4.3 10*3/uL (ref 1.7–7.7)
Neutrophils Relative %: 61 %
Platelet Count: 212 10*3/uL (ref 150–400)
RBC: 4.81 MIL/uL (ref 4.22–5.81)
RDW: 16.7 % — ABNORMAL HIGH (ref 11.5–15.5)
WBC Count: 7 10*3/uL (ref 4.0–10.5)
nRBC: 0 % (ref 0.0–0.2)

## 2018-07-07 LAB — FERRITIN: Ferritin: 25 ng/mL (ref 24–336)

## 2018-07-07 LAB — IRON AND TIBC
Iron: 23 ug/dL — ABNORMAL LOW (ref 42–163)
SATURATION RATIOS: 6 % — AB (ref 42–163)
TIBC: 395 ug/dL (ref 202–409)
UIBC: 372 ug/dL

## 2018-07-07 MED ORDER — SODIUM CHLORIDE 0.9 % IV SOLN
Freq: Once | INTRAVENOUS | Status: AC
  Administered 2018-07-07: 09:00:00 via INTRAVENOUS
  Filled 2018-07-07: qty 250

## 2018-07-07 MED ORDER — SODIUM CHLORIDE 0.9 % IV SOLN
510.0000 mg | Freq: Once | INTRAVENOUS | Status: AC
  Administered 2018-07-07: 510 mg via INTRAVENOUS
  Filled 2018-07-07: qty 17

## 2018-07-07 NOTE — Patient Instructions (Signed)

## 2018-07-09 ENCOUNTER — Telehealth: Payer: Self-pay | Admitting: Internal Medicine

## 2018-07-09 ENCOUNTER — Telehealth: Payer: Self-pay | Admitting: *Deleted

## 2018-07-09 NOTE — Telephone Encounter (Signed)
New Message  Pt is calling, states he has more questions for the nurse. Please call  1. Has your device fired? no  2. Is you device beeping? no  3. Are you experiencing draining or swelling at device site? no  4. Are you calling to see if we received your device transmission? no  5. Have you passed out? no    Please route to Device Clinic Pool

## 2018-07-09 NOTE — Telephone Encounter (Signed)
Called patient d/t AF alert (x3days). Patient denies any sx's of ShOB, palps, fatigue. I instructed patient to call if he develops any sx's. Will discuss episode with Dr.Taylor and notify patient if anything further is recommended. Patient verbalized understanding.

## 2018-07-12 NOTE — Telephone Encounter (Signed)
Patient made aware of AF episodes on Friday- he is concerned and wonders if he should start coumadin. I advised that we could not treat him without a physician and that dr. Ladona Ridgel would review the information and consider his history to develop a treatment plan. He verbalizes understanding.

## 2018-07-13 ENCOUNTER — Telehealth: Payer: Self-pay | Admitting: *Deleted

## 2018-07-13 NOTE — Telephone Encounter (Signed)
Called to inform patient that episodes were reviewed with Dr.Taylor, and he did not want to start an anticoagulant d/t bleed risk. I told patient to call back if he develops any sx's r/t the AF, but otherwise he can f/u as scheduled. Patient verbalized understanding and appreciation of information.

## 2018-07-14 ENCOUNTER — Encounter: Payer: Self-pay | Admitting: Cardiology

## 2018-07-15 ENCOUNTER — Inpatient Hospital Stay: Payer: Medicare HMO

## 2018-07-15 VITALS — BP 127/80 | HR 70 | Temp 97.8°F | Resp 16

## 2018-07-15 DIAGNOSIS — D509 Iron deficiency anemia, unspecified: Secondary | ICD-10-CM | POA: Diagnosis not present

## 2018-07-15 MED ORDER — SODIUM CHLORIDE 0.9 % IV SOLN
Freq: Once | INTRAVENOUS | Status: AC
  Administered 2018-07-15: 09:00:00 via INTRAVENOUS
  Filled 2018-07-15: qty 250

## 2018-07-15 MED ORDER — SODIUM CHLORIDE 0.9 % IV SOLN
510.0000 mg | Freq: Once | INTRAVENOUS | Status: AC
  Administered 2018-07-15: 510 mg via INTRAVENOUS
  Filled 2018-07-15: qty 17

## 2018-07-15 NOTE — Progress Notes (Signed)
Pt. Tolerated infusion well, 22 gauge IV removed intact. No further problems or concerns noted.

## 2018-07-15 NOTE — Patient Instructions (Signed)

## 2018-08-02 ENCOUNTER — Inpatient Hospital Stay: Payer: Medicare HMO | Attending: Oncology

## 2018-08-02 VITALS — BP 125/74 | HR 70 | Temp 97.7°F | Resp 18

## 2018-08-02 DIAGNOSIS — D509 Iron deficiency anemia, unspecified: Secondary | ICD-10-CM

## 2018-08-02 DIAGNOSIS — E538 Deficiency of other specified B group vitamins: Secondary | ICD-10-CM | POA: Insufficient documentation

## 2018-08-02 MED ORDER — CYANOCOBALAMIN 1000 MCG/ML IJ SOLN
1000.0000 ug | Freq: Once | INTRAMUSCULAR | Status: AC
  Administered 2018-08-02: 1000 ug via INTRAMUSCULAR

## 2018-08-02 MED ORDER — CYANOCOBALAMIN 1000 MCG/ML IJ SOLN
INTRAMUSCULAR | Status: AC
  Filled 2018-08-02: qty 1

## 2018-08-02 NOTE — Patient Instructions (Signed)

## 2018-08-17 LAB — CUP PACEART REMOTE DEVICE CHECK
Battery Remaining Longevity: 60 mo
Battery Remaining Percentage: 71 %
Brady Statistic AP VS Percent: 76 %
Brady Statistic AS VS Percent: 23 %
Brady Statistic RV Percent Paced: 1 %
Date Time Interrogation Session: 20191003060016
HIGH POWER IMPEDANCE MEASURED VALUE: 66 Ohm
HIGH POWER IMPEDANCE MEASURED VALUE: 66 Ohm
Implantable Lead Implant Date: 20090708
Implantable Lead Implant Date: 20090708
Implantable Lead Location: 753859
Implantable Pulse Generator Implant Date: 20170106
Lead Channel Impedance Value: 330 Ohm
Lead Channel Pacing Threshold Amplitude: 0.5 V
Lead Channel Pacing Threshold Pulse Width: 0.5 ms
Lead Channel Pacing Threshold Pulse Width: 0.9 ms
Lead Channel Sensing Intrinsic Amplitude: 1.8 mV
Lead Channel Setting Pacing Amplitude: 2.5 V
Lead Channel Setting Sensing Sensitivity: 0.5 mV
MDC IDC LEAD LOCATION: 753860
MDC IDC MSMT BATTERY VOLTAGE: 2.96 V
MDC IDC MSMT LEADCHNL RA IMPEDANCE VALUE: 340 Ohm
MDC IDC MSMT LEADCHNL RV PACING THRESHOLD AMPLITUDE: 1 V
MDC IDC MSMT LEADCHNL RV SENSING INTR AMPL: 12 mV
MDC IDC SET LEADCHNL RA PACING AMPLITUDE: 2 V
MDC IDC SET LEADCHNL RV PACING PULSEWIDTH: 0.9 ms
MDC IDC STAT BRADY AP VP PERCENT: 1 %
MDC IDC STAT BRADY AS VP PERCENT: 1 %
MDC IDC STAT BRADY RA PERCENT PACED: 77 %
Pulse Gen Serial Number: 7306179

## 2018-09-01 ENCOUNTER — Inpatient Hospital Stay: Payer: Medicare HMO | Attending: Oncology

## 2018-09-01 DIAGNOSIS — E538 Deficiency of other specified B group vitamins: Secondary | ICD-10-CM | POA: Diagnosis present

## 2018-09-01 DIAGNOSIS — D509 Iron deficiency anemia, unspecified: Secondary | ICD-10-CM

## 2018-09-01 MED ORDER — CYANOCOBALAMIN 1000 MCG/ML IJ SOLN
1000.0000 ug | Freq: Once | INTRAMUSCULAR | Status: AC
  Administered 2018-09-01: 1000 ug via INTRAMUSCULAR

## 2018-09-01 NOTE — Patient Instructions (Signed)

## 2018-09-16 ENCOUNTER — Telehealth: Payer: Self-pay

## 2018-09-16 NOTE — Telephone Encounter (Signed)
Per phone notes from 06/2018, Dr. Ladona Ridgel did not recommend that patient start on anticoagulation due to bleeding risk. AF now persistent. Next f/u with Dr. Ladona Ridgel currently due in 12/2018. Routed to Dr. Ladona Ridgel for review and to advise if any new recommendations.

## 2018-09-16 NOTE — Telephone Encounter (Signed)
Spoke with pt regarding his persistent AF since the beginning of October. Pt states he has not had any symptoms out of his usual fatigue and SOB. He states he has had those symptoms since before October so he does not think its associated with his AF. I advised pt we would forward this to Dr Ladona Ridgel for recommendation and call him back if there were to be any changes. Pt verbalized understanding and had no additional questions.

## 2018-09-27 NOTE — Telephone Encounter (Signed)
Start Eliquis 5 mg bid GT

## 2018-09-28 MED ORDER — APIXABAN 5 MG PO TABS: 5.0000 mg | ORAL_TABLET | Freq: Two times a day (BID) | ORAL | 11 refills | Status: DC

## 2018-09-28 NOTE — Addendum Note (Signed)
Addended by: Oretha Milch on: 09/28/2018 03:25 PM   Modules accepted: Orders

## 2018-09-28 NOTE — Telephone Encounter (Signed)
Pt contacted with Dr Lubertha Basque recommendation. Pt agrees to begin Eliquis, 5mg  bid. Side effects, including increased risk for bleeding, was reviewed with pt. Pt has verbalized understanding and had no additional questions.   Samples of Eliquis and 30 day sample card will be left behind the front desk for pt to pick up on Thursday.

## 2018-09-28 NOTE — Telephone Encounter (Signed)
Confirmed with Dr. Ladona Ridgel Start Eliquis Stop ASA/Plavix

## 2018-09-28 NOTE — Telephone Encounter (Signed)
Per Dr Lubertha Basque RN, Boneta Lucks, pt should stop his ASA and plavix when beginning his Eliquis. Pt has verbalized understanding of this and agrees with plan. He had no additional questions.

## 2018-09-30 ENCOUNTER — Telehealth: Payer: Self-pay | Admitting: Internal Medicine

## 2018-09-30 NOTE — Telephone Encounter (Signed)
New message      Pt c/o medication issue:  1. Name of Medication: apixaban (ELIQUIS) 5 MG TABS tablet  2. How are you currently taking this medication (dosage and times per day)? 1 pill 2x a day   3. Are you having a reaction (difficulty breathing--STAT)?no   4. What is your medication issue? Pt stated that medication is making him weak and cant take it

## 2018-10-01 ENCOUNTER — Inpatient Hospital Stay: Payer: Medicare HMO | Attending: Oncology

## 2018-10-01 VITALS — BP 125/62 | HR 69 | Temp 98.0°F | Resp 16

## 2018-10-01 DIAGNOSIS — E538 Deficiency of other specified B group vitamins: Secondary | ICD-10-CM | POA: Diagnosis not present

## 2018-10-01 DIAGNOSIS — D509 Iron deficiency anemia, unspecified: Secondary | ICD-10-CM

## 2018-10-01 MED ORDER — CYANOCOBALAMIN 1000 MCG/ML IJ SOLN
INTRAMUSCULAR | Status: AC
  Filled 2018-10-01: qty 1

## 2018-10-01 MED ORDER — CYANOCOBALAMIN 1000 MCG/ML IJ SOLN
1000.0000 ug | Freq: Once | INTRAMUSCULAR | Status: AC
  Administered 2018-10-01: 1000 ug via INTRAMUSCULAR

## 2018-10-01 NOTE — Telephone Encounter (Signed)
Left detailed message per DPR.  Advised weakness not a side effect of Eliquis (Pt has only taken 4 or 5 doses).  Pt getting iron infusion today per Epic review.  Advised Pt probably weak d/t low iron vs medication.  Asked Pt to try taking medication for 2 weeks before he gives up on it.  Advised after 2 weeks Pt still feeling weak call this nurse back and we can try another medication.  Left this nurse name and # for call back.

## 2018-10-04 ENCOUNTER — Ambulatory Visit (INDEPENDENT_AMBULATORY_CARE_PROVIDER_SITE_OTHER): Payer: Medicare HMO

## 2018-10-04 DIAGNOSIS — I255 Ischemic cardiomyopathy: Secondary | ICD-10-CM

## 2018-10-04 DIAGNOSIS — I5022 Chronic systolic (congestive) heart failure: Secondary | ICD-10-CM

## 2018-10-05 LAB — CUP PACEART REMOTE DEVICE CHECK
Battery Remaining Longevity: 55 mo
Battery Remaining Percentage: 67 %
Battery Voltage: 2.95 V
Brady Statistic AP VS Percent: 76 %
Brady Statistic AS VP Percent: 1 %
Brady Statistic AS VS Percent: 23 %
Brady Statistic RA Percent Paced: 66 %
Brady Statistic RV Percent Paced: 7.7 %
Date Time Interrogation Session: 20200106070018
HighPow Impedance: 66 Ohm
HighPow Impedance: 66 Ohm
Implantable Lead Implant Date: 20090708
Implantable Lead Implant Date: 20090708
Implantable Lead Location: 753859
Implantable Lead Model: 7122
Implantable Pulse Generator Implant Date: 20170106
Lead Channel Impedance Value: 330 Ohm
Lead Channel Impedance Value: 340 Ohm
Lead Channel Pacing Threshold Amplitude: 0.5 V
Lead Channel Pacing Threshold Amplitude: 1 V
Lead Channel Pacing Threshold Pulse Width: 0.9 ms
Lead Channel Sensing Intrinsic Amplitude: 12 mV
Lead Channel Sensing Intrinsic Amplitude: 2 mV
Lead Channel Setting Pacing Amplitude: 2.5 V
Lead Channel Setting Pacing Pulse Width: 0.9 ms
Lead Channel Setting Sensing Sensitivity: 0.5 mV
MDC IDC LEAD LOCATION: 753860
MDC IDC MSMT LEADCHNL RA PACING THRESHOLD PULSEWIDTH: 0.5 ms
MDC IDC SET LEADCHNL RA PACING AMPLITUDE: 2 V
MDC IDC STAT BRADY AP VP PERCENT: 1 %
Pulse Gen Serial Number: 7306179

## 2018-10-05 NOTE — Progress Notes (Signed)
Remote ICD transmission.   

## 2018-10-11 ENCOUNTER — Telehealth: Payer: Self-pay | Admitting: Internal Medicine

## 2018-10-11 MED ORDER — RIVAROXABAN 20 MG PO TABS: 20.0000 mg | ORAL_TABLET | Freq: Every day | ORAL | 11 refills | Status: DC

## 2018-10-11 NOTE — Telephone Encounter (Signed)
Returned call to daughter.  Daughter states father has been sick.  PCP had suggested nasal spray.  Agreed with nasal spray.  Will try Xarelto 20 mg daily instead of Eliquis to see if it makes any difference in Pt's symptoms.    Left samples at front.

## 2018-10-11 NOTE — Telephone Encounter (Signed)
New message  Pt c/o medication issue:  1. Name of Medication: apixaban (ELIQUIS) 5 MG TABS tablet  2. How are you currently taking this medication (dosage and times per day)?once  every 12 hours   3. Are you having a reaction (difficulty breathing--STAT)? No    4. What is your medication issue? Patient's daughter states that the patient is having severe headaches and nosebleed.Need to know if this medication may be the cause of these symptoms?

## 2018-10-13 ENCOUNTER — Telehealth: Payer: Self-pay | Admitting: Internal Medicine

## 2018-10-13 NOTE — Telephone Encounter (Signed)
New Message   Patient has had a headache for a week now and states he went to PCP Dr. Silvana Newness office and BP was normal and they didn't find anything, but he wants to speak to the nurse about the issue.

## 2018-10-14 NOTE — Telephone Encounter (Signed)
Left detailed message.  Advised if no change in how Pt is feeling it is probably not related to Eliquis or Xarelto.  Pt has had a recent cold.  Advised Pt to take tylenol for headache. Advised to call back if further concerns or would like to see Dr. Ladona Ridgel.

## 2018-10-21 NOTE — Telephone Encounter (Signed)
Call placed to Pt.  Pt states his headache is getting better.  Will continue Xarelto at this time.  Will check in with Pt in a week to see if headache continues to improve.  Pt aware.

## 2018-10-28 ENCOUNTER — Other Ambulatory Visit: Payer: Self-pay | Admitting: Internal Medicine

## 2018-11-01 ENCOUNTER — Inpatient Hospital Stay: Payer: Medicare HMO | Attending: Oncology

## 2018-11-01 VITALS — BP 148/82 | HR 82 | Temp 98.2°F | Resp 16

## 2018-11-01 DIAGNOSIS — E538 Deficiency of other specified B group vitamins: Secondary | ICD-10-CM | POA: Insufficient documentation

## 2018-11-01 DIAGNOSIS — D509 Iron deficiency anemia, unspecified: Secondary | ICD-10-CM

## 2018-11-01 MED ORDER — CYANOCOBALAMIN 1000 MCG/ML IJ SOLN
INTRAMUSCULAR | Status: AC
  Filled 2018-11-01: qty 1

## 2018-11-01 MED ORDER — CYANOCOBALAMIN 1000 MCG/ML IJ SOLN
1000.0000 ug | Freq: Once | INTRAMUSCULAR | Status: AC
  Administered 2018-11-01: 1000 ug via INTRAMUSCULAR

## 2018-11-01 NOTE — Patient Instructions (Signed)

## 2018-11-03 ENCOUNTER — Telehealth: Payer: Self-pay | Admitting: Internal Medicine

## 2018-11-03 ENCOUNTER — Telehealth: Payer: Self-pay

## 2018-11-03 NOTE — Telephone Encounter (Signed)
I s/w pt and asked if he was asking for an Rx to be sent in as well. Pt states he has only a few days left of the Xarelto. I advised pt that we don't get as many samples like we used too, but I will check and see if we have any. I called back and lmom I will place samples of 1 bottle at the front desk for him to pick up, office closes at 5 pm.

## 2018-11-03 NOTE — Telephone Encounter (Signed)
Call returned to Pt.  Per Pt his headache has abated.  Still a little headache but he takes pain medicine.  Pt states his current state is tolerable.  Discussed Pt med list.  He states his PCP took him off atorvastatin.  He also states he does not take Invokana.  Per Pt he take glimepiride.  This nurse thought he said he takes 5 mg daily, but only found a 4 mg dose.    Corrected Pt med list.  Made follow up appt.  Advised Pt to call office if he had any addl issues.

## 2018-11-03 NOTE — Telephone Encounter (Signed)
I called the pt back and advised 1 bottle of Xarelto will be at the front desk for his pick up. Pt thanked me for the call.

## 2018-11-03 NOTE — Telephone Encounter (Signed)
Operator opened wrong phone note. Pt was not calling in regards to call from 13 days ago. I will open correct phone note.

## 2018-11-03 NOTE — Telephone Encounter (Signed)
New Message   Patient calling the office for samples of medication:   1.  What medication and dosage are you requesting samples for? Xareto  2.  Are you currently out of this medication? Yes

## 2018-11-03 NOTE — Telephone Encounter (Signed)
F/U Message        Patient wanted you to know that he did not hang up on you, some how the connection was lost. Patient would like a call back with the information.

## 2018-11-09 ENCOUNTER — Other Ambulatory Visit: Payer: Self-pay | Admitting: Cardiovascular Disease

## 2018-11-30 ENCOUNTER — Inpatient Hospital Stay: Payer: Medicare HMO | Attending: Oncology

## 2018-11-30 VITALS — BP 135/75 | HR 69 | Temp 97.8°F | Resp 18

## 2018-11-30 DIAGNOSIS — D509 Iron deficiency anemia, unspecified: Secondary | ICD-10-CM

## 2018-11-30 DIAGNOSIS — E538 Deficiency of other specified B group vitamins: Secondary | ICD-10-CM | POA: Diagnosis not present

## 2018-11-30 MED ORDER — CYANOCOBALAMIN 1000 MCG/ML IJ SOLN
INTRAMUSCULAR | Status: AC
  Filled 2018-11-30: qty 1

## 2018-11-30 MED ORDER — CYANOCOBALAMIN 1000 MCG/ML IJ SOLN
1000.0000 ug | Freq: Once | INTRAMUSCULAR | Status: AC
  Administered 2018-11-30: 1000 ug via INTRAMUSCULAR

## 2018-12-22 ENCOUNTER — Telehealth: Payer: Self-pay | Admitting: *Deleted

## 2018-12-22 NOTE — Telephone Encounter (Signed)
Spoke with patient, per dr Clelia Croft, will keep appt for 12/31/2018 as is.

## 2018-12-24 ENCOUNTER — Encounter: Payer: Self-pay | Admitting: Internal Medicine

## 2018-12-29 ENCOUNTER — Telehealth: Payer: Self-pay | Admitting: Internal Medicine

## 2018-12-29 NOTE — Telephone Encounter (Signed)
appt made with Dr. Ladona Ridgel for tomorrow for Pt to discuss concerns.

## 2018-12-29 NOTE — Telephone Encounter (Signed)
New message   Pt c/o medication issue:  1. Name of Medication: rivaroxaban (XARELTO) 20 MG TABS tablet  2. How are you currently taking this medication (dosage and times per day)? 1 time daily  3. Are you having a reaction (difficulty breathing--STAT)? No   4. What is your medication issue? Patient states that he is having severe diarrhea. The patient thinks that this may be coming from his medication.

## 2018-12-30 ENCOUNTER — Other Ambulatory Visit: Payer: Self-pay

## 2018-12-30 ENCOUNTER — Telehealth (INDEPENDENT_AMBULATORY_CARE_PROVIDER_SITE_OTHER): Payer: Medicare HMO | Admitting: Internal Medicine

## 2018-12-30 DIAGNOSIS — I4819 Other persistent atrial fibrillation: Secondary | ICD-10-CM

## 2018-12-30 DIAGNOSIS — R1030 Lower abdominal pain, unspecified: Secondary | ICD-10-CM

## 2018-12-30 DIAGNOSIS — I5022 Chronic systolic (congestive) heart failure: Secondary | ICD-10-CM | POA: Diagnosis not present

## 2018-12-30 NOTE — Progress Notes (Signed)
Electrophysiology TeleHealth Note   Due to national recommendations of social distancing due to COVID 19, an audio/video telehealth visit is felt to be most appropriate for this patient at this time.  See MyChart message from today for the patient's consent to telehealth for [facility].   Date:  12/30/2018   ID:  [name], DOB 1946/03/25, MRN [id number]  Location: patient's home  Provider location: [address], [place]  Evaluation Performed: Follow-up visit  PCP:  [name], DO  Cardiologist:  No primary care provider on file. Electrophysiologist:  Dr Ladona Ridgel  Chief Complaint:  "I've been having pain in my stomach. "  History of Present Illness:    [name] is a 73 y.o. male who presents via audio/video conferencing for a telehealth visit today. He is a pleasant 73 yo man with a h/o CAD, s/p MI, chronic systolic heart failure, s/p ICD insertion.  Since last being seen in our clinic, the patient reports that his breathing has been ok but about twice a week he will have some abdominal pain and has to go to the bathroom very quickly. He has had a couple of episodes of incontinence of stool. He wonders if it is related to the xarelto he started taking 3 months ago. He does not feel his atrial fib.  Today, he denies symptoms of palpitations, chest pain, shortness of breath,  lower extremity edema, dizziness, presyncope, or syncope.  The patient is otherwise without complaint today.  The patient denies symptoms of fevers, chills, cough, or new SOB worrisome for COVID 19.  Past Medical History:  Diagnosis Date  . Anemia    Multifactorial  - hx of B12 def and iron deficiency, followed by heme, [facility]  . Automatic implantable cardioverter-defibrillator in situ   . CAD (coronary artery disease)    a. STEMI s/p CABG 07/2007 (had preop cardiogenic shock, IABP, VDRF before surgery)  . Cardiomyopathy, ischemic    a. Chronic  systolic CHF s/p St. Jude dual chamber ICD 03/2008.  . Carotid stenosis    a. Prior carotid dz/ surgery. b. Carotid dopp 07/2011 - 0-39% bilaterally.;   c. Doppler 11/13: 40-59% RICA, 0-39% LICA  . Chronic renal insufficiency   . Diabetes mellitus   . GI bleed    Reported history  . Hx of CABG   . Hypertension    [facility]   . Mitral regurgitation   . Myocardial infarction (HCC)   . Neuromuscular disorder (HCC)    R sided weakness   . Osteoarthritis   . PAF (paroxysmal atrial fibrillation) (HCC)    a. Poor Coumadin candidate due to hx of GI bleed.  . Sinus bradycardia   . Sleep apnea    study done, 10 yrs ago, unable to tolerate CPAP  . Splenomegaly   . Stroke [facility])    a. CVA 1990s - chronic pain in RUE after stroke.  [name] Systolic CHF, chronic (HCC)    Previously taken off lisinopril by primary doctor due to labs  . Tobacco abuse     Past Surgical History:  Procedure Laterality Date  . CARDIAC DEFIBRILLATOR PLACEMENT  04/05/2008   Implantation of a St. Jude dual-chamber defibrillator, Doylene Canning. Ladona Ridgel , MD  . CARDIAC DEFIBRILLATOR PLACEMENT    . CHOLECYSTECTOMY N/A 02/19/2016   Procedure: LAPAROSCOPIC CHOLECYSTECTOMY;  Surgeon: Abigail Miyamoto, MD;  Location: [facility] OR;  Service: General;  Laterality: N/A;  . COLONOSCOPY  07/2007  Rourk: left-sided diverticulum. TI normal. No etiology for IDA.  [name] CORONARY ARTERY BYPASS GRAFT    . CORONARY ARTERY BYPASS GRAFT  08/29/2007   x3; Marilu Favre H. Cornelius Moras MD  . EP IMPLANTABLE DEVICE N/A 10/05/2015   Procedure: ICD Generator Changeout;  Surgeon: Marinus Maw, MD;  Location: [facility] INVASIVE CV LAB;  Service: Cardiovascular;  Laterality: N/A;  . ERCP N/A 02/18/2016   Procedure: ENDOSCOPIC RETROGRADE CHOLANGIOPANCREATOGRAPHY (ERCP);  Surgeon: Vida Rigger, MD;  Location: [facility] ENDOSCOPY;  Service: Endoscopy;  Laterality: N/A;  . ESOPHAGOGASTRODUODENOSCOPY  07/2007   Rourk: small hh, atrophic gastric mucosa, poor distensibility of stomach  ?extrinsic compression, CT showed mild to moderate splenomegaly. No etiology for IDA.  [name] JOINT REPLACEMENT  1990's   L knee  . LAPAROSCOPIC APPENDECTOMY N/A 09/14/2017   Procedure: APPENDECTOMY LAPAROSCOPIC;  Surgeon: Jimmye Norman, MD;  Location: [facility] OR;  Service: General;  Laterality: N/A;  . Percutaneous coronary intervention  01/10/2005   Charlies Constable, MD  . TONSILLECTOMY    . TOTAL KNEE ARTHROPLASTY Right 08/16/2013   Procedure: RIGHT TOTAL KNEE ARTHROPLASTY;  Surgeon: Kathryne Hitch, MD;  Location: [facility] OR;  Service: Orthopedics;  Laterality: Right;    Current Outpatient Medications  Medication Sig Dispense Refill  . baclofen (LIORESAL) 20 MG tablet Take 20 mg by mouth 2 (two) times daily.     . carvedilol (COREG) 12.5 MG tablet Take 1 tablet (12.5 mg total) by mouth 2 (two) times daily with a meal. Please keep upcoming appt in April with Dr. Ladona Ridgel for future refills. Thank you 180 tablet 0  . furosemide (LASIX) 40 MG tablet Take 40 mg by mouth daily as needed for fluid.     [name] glimepiride (AMARYL) 4 MG tablet Take 4 mg by mouth daily with breakfast.    . linagliptin (TRADJENTA) 5 MG TABS tablet Take 5 mg by mouth daily after breakfast.     . meclizine (ANTIVERT) 25 MG tablet Take 25 mg by mouth 4 (four) times daily as needed. For dizziness    . methocarbamol (ROBAXIN) 500 MG tablet Take 1 tablet (500 mg total) by mouth every 8 (eight) hours as needed for muscle spasms. 30 tablet 0  . oxyCODONE-acetaminophen (PERCOCET) 10-325 MG per tablet Take 1 tablet by mouth every 4 (four) hours as needed for pain.     . polyethylene glycol (MIRALAX / GLYCOLAX) packet Take 17 g by mouth daily. 14 each 0  . rivaroxaban (XARELTO) 20 MG TABS tablet Take 1 tablet (20 mg total) by mouth daily with supper. 30 tablet 11  . SENNA CO Take 1 tablet by mouth daily as needed (constipation).     [name] spironolactone (ALDACTONE) 25 MG tablet Take 1 tablet (25 mg total) by mouth every morning. 90 tablet 3   No  current facility-administered medications for this visit.     Allergies:   Codeine   Social History:  The patient  reports that he quit smoking about 28 years ago. He has a 15.00 pack-year smoking history. He quit smokeless tobacco use about 30 years ago. He reports that he does not drink alcohol or use drugs.   Family History:  The patient's family history includes Coronary artery disease in his brother; Diabetes in his mother and sister; Heart attack in his father and mother.   ROS:  Please see the history of present illness.   All other systems are personally reviewed and negative.    Exam:    Vital Signs:  There were  no vitals taken for this visit.   Labs/Other Tests and Data Reviewed:    Recent Labs: 03/12/2018: ALT 11; BUN 20; Creatinine, Ser 1.03; Potassium 4.2; Sodium 143 07/07/2018: Hemoglobin 11.8; Platelet Count 212   Wt Readings from Last 3 Encounters:  07/02/18 208 lb 14.4 oz (94.8 kg)  03/12/18 210 lb (95.3 kg)  01/05/18 211 lb 6.4 oz (95.9 kg)     Other studies personally reviewed: Additional studies/ records that were reviewed today include:   Last device remote is reviewed from [employer] PDF dated 10/04/18 which reveals normal device function, no arrhythmias    ASSESSMENT & PLAN:    1.  Persistent Atrial fib - he has no palpitations. He would like to take plavix instead of Xarelto. I have explained that the plavix that he had taken previously would not protect him from a stroke with his atrial fib. 2. GI distress - these episodes occur once or twice a week. I have mentioned that I do not think that they are related to the Xarelto but if they persist, then I think it would be reasonable to hold the xarelto and see if they go away.  3. Chronic systolic heart failure - he remains active caring for his wife who is in a wheel chair. 4. COVID 19 screen The patient denies symptoms of COVID 19 at this time.  The importance of social distancing was discussed today.   Follow-up:  4 months Next remote: 01/04/2019  Current medicines are reviewed at length with the patient today.   The patient has concerns regarding his medicines.  The following changes were made today:  Xarelto will be continued for now but we will consider stopping if his symptoms persist  Labs/ tests ordered today include:  No orders of the defined types were placed in this encounter.    Patient Risk:  after full review of this patients clinical status, I feel that they are at moderate risk at this time.  Today, I have spent 15 minutes with the patient with telehealth technology discussing his problems as noted above.    Signed, Lewayne Bunting, MD  12/30/2018 12:48 PM     [facility] [address] [place] [address] [phone] (office) [phone] (fax)

## 2018-12-31 ENCOUNTER — Ambulatory Visit: Payer: Medicare HMO | Admitting: Oncology

## 2018-12-31 ENCOUNTER — Ambulatory Visit: Payer: Medicare HMO

## 2018-12-31 ENCOUNTER — Other Ambulatory Visit: Payer: Medicare HMO

## 2018-12-31 ENCOUNTER — Other Ambulatory Visit: Payer: Self-pay

## 2018-12-31 ENCOUNTER — Inpatient Hospital Stay (HOSPITAL_BASED_OUTPATIENT_CLINIC_OR_DEPARTMENT_OTHER): Payer: Medicare HMO | Admitting: Oncology

## 2018-12-31 ENCOUNTER — Inpatient Hospital Stay: Payer: Medicare HMO | Attending: Oncology

## 2018-12-31 ENCOUNTER — Telehealth: Payer: Self-pay | Admitting: Oncology

## 2018-12-31 ENCOUNTER — Inpatient Hospital Stay: Payer: Medicare HMO

## 2018-12-31 VITALS — BP 117/75 | HR 82 | Temp 97.4°F | Resp 18

## 2018-12-31 VITALS — BP 130/80 | HR 71 | Temp 97.4°F | Resp 17 | Ht 73.0 in | Wt 210.2 lb

## 2018-12-31 DIAGNOSIS — E538 Deficiency of other specified B group vitamins: Secondary | ICD-10-CM

## 2018-12-31 DIAGNOSIS — D509 Iron deficiency anemia, unspecified: Secondary | ICD-10-CM | POA: Diagnosis not present

## 2018-12-31 DIAGNOSIS — Z7901 Long term (current) use of anticoagulants: Secondary | ICD-10-CM | POA: Diagnosis not present

## 2018-12-31 LAB — FERRITIN: Ferritin: 34 ng/mL (ref 24–336)

## 2018-12-31 LAB — CBC WITH DIFFERENTIAL (CANCER CENTER ONLY)
Abs Immature Granulocytes: 0.01 10*3/uL (ref 0.00–0.07)
Basophils Absolute: 0 10*3/uL (ref 0.0–0.1)
Basophils Relative: 1 %
Eosinophils Absolute: 0.1 10*3/uL (ref 0.0–0.5)
Eosinophils Relative: 2 %
HCT: 43.1 % (ref 39.0–52.0)
Hemoglobin: 14.1 g/dL (ref 13.0–17.0)
Immature Granulocytes: 0 %
Lymphocytes Relative: 23 %
Lymphs Abs: 1.3 10*3/uL (ref 0.7–4.0)
MCH: 29.2 pg (ref 26.0–34.0)
MCHC: 32.7 g/dL (ref 30.0–36.0)
MCV: 89.2 fL (ref 80.0–100.0)
Monocytes Absolute: 0.7 10*3/uL (ref 0.1–1.0)
Monocytes Relative: 12 %
Neutro Abs: 3.7 10*3/uL (ref 1.7–7.7)
Neutrophils Relative %: 62 %
Platelet Count: 201 10*3/uL (ref 150–400)
RBC: 4.83 MIL/uL (ref 4.22–5.81)
RDW: 15.3 % (ref 11.5–15.5)
WBC Count: 5.8 10*3/uL (ref 4.0–10.5)
nRBC: 0 % (ref 0.0–0.2)

## 2018-12-31 LAB — IRON AND TIBC
Iron: 44 ug/dL (ref 42–163)
Saturation Ratios: 12 % — ABNORMAL LOW (ref 20–55)
TIBC: 355 ug/dL (ref 202–409)
UIBC: 311 ug/dL (ref 117–376)

## 2018-12-31 MED ORDER — CYANOCOBALAMIN 1000 MCG/ML IJ SOLN
1000.0000 ug | Freq: Once | INTRAMUSCULAR | Status: AC
  Administered 2018-12-31: 1000 ug via INTRAMUSCULAR

## 2018-12-31 MED ORDER — CYANOCOBALAMIN 1000 MCG/ML IJ SOLN
INTRAMUSCULAR | Status: AC
  Filled 2018-12-31: qty 1

## 2018-12-31 NOTE — Telephone Encounter (Signed)
Scheduled appt per 4/3 sch message. ° °Patient aware of appt date and time. °

## 2018-12-31 NOTE — Progress Notes (Signed)
[facility] HEMATOLOGY OFFICE PROGRESS NOTE 12/31/18   [name] Jester, DO [address] [place] [address]  DIAGNOSIS: 73 year old man with:  1. Iron deficiency anemia related to poor iron absorption diagnosed in 2008.     2. B12 deficiency related to poor absorption diagnosed in 2008.   CURRENT THERAPY:   B-12 1000 mcg IM injection on a monthly basis.  Feraheme infusion as needed.  Last treatment was given in October 2019.  INTERVAL HISTORY:  Mr. Ulery is here for a follow-up visit.  Since the last visit, he reports no major changes in his health.  He continues to tolerate B12 injection without any complaints or complications.  He receives Feraheme infusion in October 2019 with improvement in his overall performance status and laboratory values.  He denies excessive fatigue, tiredness or dyspnea on exertion.  Patient denied any alteration mental status, neuropathy, confusion or dizziness.  Denies any headaches or lethargy.  Denies any night sweats, weight loss or changes in appetite.  Denied orthopnea, dyspnea on exertion or chest discomfort.  Denies shortness of breath, difficulty breathing hemoptysis or cough.  Denies any abdominal distention, nausea, early satiety or dyspepsia.  Denies any hematuria, frequency, dysuria or nocturia.  Denies any skin irritation, dryness or rash.  Denies any ecchymosis or petechiae.  Denies any lymphadenopathy or clotting.  Denies any heat or cold intolerance.  Denies any anxiety or depression.  Remaining review of system is negative.    ALLERGIES:  is allergic to codeine.  MEDICATIONS:   Current Outpatient Medications  Medication Sig Dispense Refill  . baclofen (LIORESAL) 20 MG tablet Take 20 mg by mouth 2 (two) times daily.     . carvedilol (COREG) 12.5 MG tablet Take 1 tablet (12.5 mg total) by mouth 2 (two) times daily with a meal. Please keep upcoming appt in April with Dr. Ladona Ridgel for future refills. Thank you 180 tablet  0  . furosemide (LASIX) 40 MG tablet Take 40 mg by mouth daily as needed for fluid.     [name] glimepiride (AMARYL) 4 MG tablet Take 4 mg by mouth daily with breakfast.    . linagliptin (TRADJENTA) 5 MG TABS tablet Take 5 mg by mouth daily after breakfast.     . meclizine (ANTIVERT) 25 MG tablet Take 25 mg by mouth 4 (four) times daily as needed. For dizziness    . methocarbamol (ROBAXIN) 500 MG tablet Take 1 tablet (500 mg total) by mouth every 8 (eight) hours as needed for muscle spasms. 30 tablet 0  . oxyCODONE-acetaminophen (PERCOCET) 10-325 MG per tablet Take 1 tablet by mouth every 4 (four) hours as needed for pain.     . polyethylene glycol (MIRALAX / GLYCOLAX) packet Take 17 g by mouth daily. 14 each 0  . rivaroxaban (XARELTO) 20 MG TABS tablet Take 1 tablet (20 mg total) by mouth daily with supper. 30 tablet 11  . SENNA CO Take 1 tablet by mouth daily as needed (constipation).     [name] spironolactone (ALDACTONE) 25 MG tablet Take 1 tablet (25 mg total) by mouth every morning. 90 tablet 3   No current facility-administered medications for this visit.      PHYSICAL EXAMINATION: Blood pressure 130/80, pulse 71, temperature (!) 97.4 F (36.3 C), temperature source Oral, resp. rate 17, height 6\' 1"  (1.854 m), weight 210 lb 3.2 oz (95.3 kg), SpO2 100 %.   ECOG PERFORMANCE STATUS: 1    General appearance: Alert, awake without any  distress. Head: Atraumatic without abnormalities Oropharynx: Without any thrush or ulcers. Eyes: No scleral icterus. Lymph nodes: No lymphadenopathy noted in the cervical, supraclavicular, or axillary nodes Heart:regular rate and rhythm, without any murmurs or gallops.   Lung: Clear to auscultation without any rhonchi, wheezes or dullness to percussion. Abdomin: Soft, nontender without any shifting dullness or ascites. Musculoskeletal: No clubbing or cyanosis. Neurological: No motor or sensory deficits. Skin: No rashes or lesions.      Labs:  Lab Results   Component Value Date   WBC 5.8 12/31/2018   HGB 14.1 12/31/2018   HCT 43.1 12/31/2018   MCV 89.2 12/31/2018   PLT 201 12/31/2018   NEUTROABS 3.7 12/31/2018      Chemistry      Component Value Date/Time   NA 143 03/12/2018 1723   NA 141 06/02/2017 0952   K 4.2 03/12/2018 1723   K 4.0 06/02/2017 0952   CL 108 03/12/2018 1723   CL 100 01/06/2013 0830   CO2 26 03/12/2018 1723   CO2 28 06/02/2017 0952   BUN 20 03/12/2018 1723   BUN 15.7 06/02/2017 0952   CREATININE 1.03 03/12/2018 1723   CREATININE 0.9 06/02/2017 0952      Component Value Date/Time   CALCIUM 8.9 03/12/2018 1723   CALCIUM 9.4 06/02/2017 0952   ALKPHOS 79 03/12/2018 1723   ALKPHOS 81 06/02/2017 0952   AST 14 (L) 03/12/2018 1723   AST 13 06/02/2017 0952   ALT 11 (L) 03/12/2018 1723   ALT 9 06/02/2017 0952   BILITOT 0.4 03/12/2018 1723   BILITOT 0.46 06/02/2017 0952        Assessment and plan:    73 year old man with:   1. B-12 deficiency related to poor absorption dating back to 2008.    He continues to receive monthly B12 injection with improvement in his overall hemoglobin and B12 levels.  Risks and benefits of continuing this approach long-term was discussed today.  Complications related to this therapy was also outlined.  At this time he is agreeable to continue.  2. Iron deficiency anemia: Due to poor oral iron absorption does require intermittent intravenous iron infusion.  Hemoglobin today showed significant improvement in his counts since October 2019.  He did not have any complications related to Feraheme infusion he understands he might need repeated infusions in the future.  He is agreeable to continue with this approach.  3. Follow-up: For monthly B12 injection and MD follow-up in 6 months.    15  minutes was spent with the patient face-to-face today.  More than 50% of time was dedicated to discussing the natural course of his disease, treatment options and complications related to  therapy.  Eli Hose MD 12/31/18

## 2018-12-31 NOTE — Patient Instructions (Signed)
Cyanocobalamin, Vitamin B12 injection What is this medicine? CYANOCOBALAMIN (sye an oh koe BAL a min) is a man made form of vitamin B12. Vitamin B12 is used in the growth of healthy blood cells, nerve cells, and proteins in the body. It also helps with the metabolism of fats and carbohydrates. This medicine is used to treat people who can not absorb vitamin B12. This medicine may be used for other purposes; ask your health care provider or pharmacist if you have questions. COMMON BRAND NAME(S): B-12 Compliance Kit, B-12 Injection Kit, Cyomin, LA-12, Nutri-Twelve, Physicians EZ Use B-12, Primabalt What should I tell my health care provider before I take this medicine? They need to know if you have any of these conditions: -kidney disease -Leber's disease -megaloblastic anemia -an unusual or allergic reaction to cyanocobalamin, cobalt, other medicines, foods, dyes, or preservatives -pregnant or trying to get pregnant -breast-feeding How should I use this medicine? This medicine is injected into a muscle or deeply under the skin. It is usually given by a health care professional in a clinic or doctor's office. However, your doctor may teach you how to inject yourself. Follow all instructions. Talk to your pediatrician regarding the use of this medicine in children. Special care may be needed. Overdosage: If you think you have taken too much of this medicine contact a poison control center or emergency room at once. NOTE: This medicine is only for you. Do not share this medicine with others. What if I miss a dose? If you are given your dose at a clinic or doctor's office, call to reschedule your appointment. If you give your own injections and you miss a dose, take it as soon as you can. If it is almost time for your next dose, take only that dose. Do not take double or extra doses. What may interact with this medicine? -colchicine -heavy alcohol intake This list may not describe all possible  interactions. Give your health care provider a list of all the medicines, herbs, non-prescription drugs, or dietary supplements you use. Also tell them if you smoke, drink alcohol, or use illegal drugs. Some items may interact with your medicine. What should I watch for while using this medicine? Visit your doctor or health care professional regularly. You may need blood work done while you are taking this medicine. You may need to follow a special diet. Talk to your doctor. Limit your alcohol intake and avoid smoking to get the best benefit. What side effects may I notice from receiving this medicine? Side effects that you should report to your doctor or health care professional as soon as possible: -allergic reactions like skin rash, itching or hives, swelling of the face, lips, or tongue -blue tint to skin -chest tightness, pain -difficulty breathing, wheezing -dizziness -red, swollen painful area on the leg Side effects that usually do not require medical attention (report to your doctor or health care professional if they continue or are bothersome): -diarrhea -headache This list may not describe all possible side effects. Call your doctor for medical advice about side effects. You may report side effects to FDA at 1-800-FDA-1088. Where should I keep my medicine? Keep out of the reach of children. Store at room temperature between 15 and 30 degrees C (59 and 85 degrees F). Protect from light. Throw away any unused medicine after the expiration date. NOTE: This sheet is a summary. It may not cover all possible information. If you have questions about this medicine, talk to your doctor, pharmacist, or   health care provider.  2019 Elsevier/Gold Standard (2007-12-27 22:10:20)  

## 2019-01-03 ENCOUNTER — Other Ambulatory Visit: Payer: Self-pay

## 2019-01-03 ENCOUNTER — Telehealth: Payer: Self-pay

## 2019-01-03 ENCOUNTER — Ambulatory Visit (INDEPENDENT_AMBULATORY_CARE_PROVIDER_SITE_OTHER): Payer: Medicare HMO | Admitting: *Deleted

## 2019-01-03 DIAGNOSIS — I255 Ischemic cardiomyopathy: Secondary | ICD-10-CM

## 2019-01-03 DIAGNOSIS — I5022 Chronic systolic (congestive) heart failure: Secondary | ICD-10-CM | POA: Diagnosis not present

## 2019-01-03 LAB — CUP PACEART REMOTE DEVICE CHECK
Battery Remaining Longevity: 54 mo
Battery Remaining Percentage: 65 %
Battery Voltage: 2.93 V
Brady Statistic AP VP Percent: 1 %
Brady Statistic AP VS Percent: 76 %
Brady Statistic AS VP Percent: 1 %
Brady Statistic AS VS Percent: 23 %
Brady Statistic RA Percent Paced: 58 %
Brady Statistic RV Percent Paced: 12 %
Date Time Interrogation Session: 20200406160144
HighPow Impedance: 55 Ohm
HighPow Impedance: 55 Ohm
Implantable Lead Implant Date: 20090708
Implantable Lead Implant Date: 20090708
Implantable Lead Location: 753859
Implantable Lead Location: 753860
Implantable Lead Model: 7122
Implantable Pulse Generator Implant Date: 20170106
Lead Channel Impedance Value: 330 Ohm
Lead Channel Impedance Value: 360 Ohm
Lead Channel Pacing Threshold Amplitude: 0.5 V
Lead Channel Pacing Threshold Amplitude: 1 V
Lead Channel Pacing Threshold Pulse Width: 0.5 ms
Lead Channel Pacing Threshold Pulse Width: 0.9 ms
Lead Channel Sensing Intrinsic Amplitude: 12 mV
Lead Channel Sensing Intrinsic Amplitude: 2 mV
Lead Channel Setting Pacing Amplitude: 2 V
Lead Channel Setting Pacing Amplitude: 2.5 V
Lead Channel Setting Pacing Pulse Width: 0.9 ms
Lead Channel Setting Sensing Sensitivity: 0.5 mV
Pulse Gen Serial Number: 7306179

## 2019-01-03 NOTE — Telephone Encounter (Signed)
Spoke with patient to remind of missed remote transmission 

## 2019-01-05 ENCOUNTER — Encounter: Payer: Medicare HMO | Admitting: Internal Medicine

## 2019-01-11 ENCOUNTER — Encounter: Payer: Self-pay | Admitting: Cardiology

## 2019-01-11 NOTE — Progress Notes (Signed)
Remote ICD transmission.   

## 2019-01-31 ENCOUNTER — Inpatient Hospital Stay: Payer: Medicare HMO | Attending: Oncology

## 2019-01-31 ENCOUNTER — Other Ambulatory Visit: Payer: Self-pay

## 2019-01-31 VITALS — BP 160/92 | HR 70 | Temp 97.6°F | Resp 18

## 2019-01-31 DIAGNOSIS — E538 Deficiency of other specified B group vitamins: Secondary | ICD-10-CM | POA: Diagnosis not present

## 2019-01-31 DIAGNOSIS — D509 Iron deficiency anemia, unspecified: Secondary | ICD-10-CM

## 2019-01-31 MED ORDER — CYANOCOBALAMIN 1000 MCG/ML IJ SOLN
INTRAMUSCULAR | Status: AC
  Filled 2019-01-31: qty 1

## 2019-01-31 MED ORDER — CYANOCOBALAMIN 1000 MCG/ML IJ SOLN
1000.0000 ug | Freq: Once | INTRAMUSCULAR | Status: AC
  Administered 2019-01-31: 08:00:00 1000 ug via INTRAMUSCULAR

## 2019-01-31 NOTE — Patient Instructions (Signed)
Cyanocobalamin, Vitamin B12 injection What is this medicine? CYANOCOBALAMIN (sye an oh koe BAL a min) is a man made form of vitamin B12. Vitamin B12 is used in the growth of healthy blood cells, nerve cells, and proteins in the body. It also helps with the metabolism of fats and carbohydrates. This medicine is used to treat people who can not absorb vitamin B12. This medicine may be used for other purposes; ask your health care provider or pharmacist if you have questions. COMMON BRAND NAME(S): B-12 Compliance Kit, B-12 Injection Kit, Cyomin, LA-12, Nutri-Twelve, Physicians EZ Use B-12, Primabalt What should I tell my health care provider before I take this medicine? They need to know if you have any of these conditions: -kidney disease -Leber's disease -megaloblastic anemia -an unusual or allergic reaction to cyanocobalamin, cobalt, other medicines, foods, dyes, or preservatives -pregnant or trying to get pregnant -breast-feeding How should I use this medicine? This medicine is injected into a muscle or deeply under the skin. It is usually given by a health care professional in a clinic or doctor's office. However, your doctor may teach you how to inject yourself. Follow all instructions. Talk to your pediatrician regarding the use of this medicine in children. Special care may be needed. Overdosage: If you think you have taken too much of this medicine contact a poison control center or emergency room at once. NOTE: This medicine is only for you. Do not share this medicine with others. What if I miss a dose? If you are given your dose at a clinic or doctor's office, call to reschedule your appointment. If you give your own injections and you miss a dose, take it as soon as you can. If it is almost time for your next dose, take only that dose. Do not take double or extra doses. What may interact with this medicine? -colchicine -heavy alcohol intake This list may not describe all possible  interactions. Give your health care provider a list of all the medicines, herbs, non-prescription drugs, or dietary supplements you use. Also tell them if you smoke, drink alcohol, or use illegal drugs. Some items may interact with your medicine. What should I watch for while using this medicine? Visit your doctor or health care professional regularly. You may need blood work done while you are taking this medicine. You may need to follow a special diet. Talk to your doctor. Limit your alcohol intake and avoid smoking to get the best benefit. What side effects may I notice from receiving this medicine? Side effects that you should report to your doctor or health care professional as soon as possible: -allergic reactions like skin rash, itching or hives, swelling of the face, lips, or tongue -blue tint to skin -chest tightness, pain -difficulty breathing, wheezing -dizziness -red, swollen painful area on the leg Side effects that usually do not require medical attention (report to your doctor or health care professional if they continue or are bothersome): -diarrhea -headache This list may not describe all possible side effects. Call your doctor for medical advice about side effects. You may report side effects to FDA at 1-800-FDA-1088. Where should I keep my medicine? Keep out of the reach of children. Store at room temperature between 15 and 30 degrees C (59 and 85 degrees F). Protect from light. Throw away any unused medicine after the expiration date. NOTE: This sheet is a summary. It may not cover all possible information. If you have questions about this medicine, talk to your doctor, pharmacist, or   health care provider.  2019 Elsevier/Gold Standard (2007-12-27 22:10:20)  

## 2019-02-10 ENCOUNTER — Telehealth: Payer: Self-pay | Admitting: Internal Medicine

## 2019-02-10 NOTE — Telephone Encounter (Signed)
Samples sent back to refill department they were not picked up , was at front desk since 11/03/18

## 2019-03-03 ENCOUNTER — Inpatient Hospital Stay: Payer: Medicare HMO | Attending: Oncology

## 2019-03-03 ENCOUNTER — Other Ambulatory Visit: Payer: Self-pay

## 2019-03-03 VITALS — BP 132/78 | HR 78 | Temp 98.8°F | Resp 17

## 2019-03-03 DIAGNOSIS — D508 Other iron deficiency anemias: Secondary | ICD-10-CM | POA: Insufficient documentation

## 2019-03-03 DIAGNOSIS — D509 Iron deficiency anemia, unspecified: Secondary | ICD-10-CM

## 2019-03-03 DIAGNOSIS — E538 Deficiency of other specified B group vitamins: Secondary | ICD-10-CM | POA: Diagnosis present

## 2019-03-03 DIAGNOSIS — Z79899 Other long term (current) drug therapy: Secondary | ICD-10-CM | POA: Diagnosis not present

## 2019-03-03 MED ORDER — CYANOCOBALAMIN 1000 MCG/ML IJ SOLN
1000.0000 ug | Freq: Once | INTRAMUSCULAR | Status: AC
  Administered 2019-03-03: 1000 ug via INTRAMUSCULAR

## 2019-03-03 NOTE — Patient Instructions (Signed)
Cyanocobalamin, Vitamin B12 injection What is this medicine? CYANOCOBALAMIN (sye an oh koe BAL a min) is a man made form of vitamin B12. Vitamin B12 is used in the growth of healthy blood cells, nerve cells, and proteins in the body. It also helps with the metabolism of fats and carbohydrates. This medicine is used to treat people who can not absorb vitamin B12. This medicine may be used for other purposes; ask your health care provider or pharmacist if you have questions. COMMON BRAND NAME(S): B-12 Compliance Kit, B-12 Injection Kit, Cyomin, LA-12, Nutri-Twelve, Physicians EZ Use B-12, Primabalt What should I tell my health care provider before I take this medicine? They need to know if you have any of these conditions: -kidney disease -Leber's disease -megaloblastic anemia -an unusual or allergic reaction to cyanocobalamin, cobalt, other medicines, foods, dyes, or preservatives -pregnant or trying to get pregnant -breast-feeding How should I use this medicine? This medicine is injected into a muscle or deeply under the skin. It is usually given by a health care professional in a clinic or doctor's office. However, your doctor may teach you how to inject yourself. Follow all instructions. Talk to your pediatrician regarding the use of this medicine in children. Special care may be needed. Overdosage: If you think you have taken too much of this medicine contact a poison control center or emergency room at once. NOTE: This medicine is only for you. Do not share this medicine with others. What if I miss a dose? If you are given your dose at a clinic or doctor's office, call to reschedule your appointment. If you give your own injections and you miss a dose, take it as soon as you can. If it is almost time for your next dose, take only that dose. Do not take double or extra doses. What may interact with this medicine? -colchicine -heavy alcohol intake This list may not describe all possible  interactions. Give your health care provider a list of all the medicines, herbs, non-prescription drugs, or dietary supplements you use. Also tell them if you smoke, drink alcohol, or use illegal drugs. Some items may interact with your medicine. What should I watch for while using this medicine? Visit your doctor or health care professional regularly. You may need blood work done while you are taking this medicine. You may need to follow a special diet. Talk to your doctor. Limit your alcohol intake and avoid smoking to get the best benefit. What side effects may I notice from receiving this medicine? Side effects that you should report to your doctor or health care professional as soon as possible: -allergic reactions like skin rash, itching or hives, swelling of the face, lips, or tongue -blue tint to skin -chest tightness, pain -difficulty breathing, wheezing -dizziness -red, swollen painful area on the leg Side effects that usually do not require medical attention (report to your doctor or health care professional if they continue or are bothersome): -diarrhea -headache This list may not describe all possible side effects. Call your doctor for medical advice about side effects. You may report side effects to FDA at 1-800-FDA-1088. Where should I keep my medicine? Keep out of the reach of children. Store at room temperature between 15 and 30 degrees C (59 and 85 degrees F). Protect from light. Throw away any unused medicine after the expiration date. NOTE: This sheet is a summary. It may not cover all possible information. If you have questions about this medicine, talk to your doctor, pharmacist, or   health care provider.  2019 Elsevier/Gold Standard (2007-12-27 22:10:20)  

## 2019-03-31 ENCOUNTER — Inpatient Hospital Stay: Payer: Medicare HMO

## 2019-03-31 ENCOUNTER — Other Ambulatory Visit: Payer: Self-pay

## 2019-03-31 ENCOUNTER — Inpatient Hospital Stay: Payer: Medicare HMO | Attending: Oncology

## 2019-03-31 VITALS — BP 139/78 | HR 86 | Temp 97.7°F | Resp 18

## 2019-03-31 DIAGNOSIS — Z79899 Other long term (current) drug therapy: Secondary | ICD-10-CM | POA: Insufficient documentation

## 2019-03-31 DIAGNOSIS — D508 Other iron deficiency anemias: Secondary | ICD-10-CM | POA: Diagnosis present

## 2019-03-31 DIAGNOSIS — D509 Iron deficiency anemia, unspecified: Secondary | ICD-10-CM

## 2019-03-31 DIAGNOSIS — E538 Deficiency of other specified B group vitamins: Secondary | ICD-10-CM | POA: Diagnosis present

## 2019-03-31 LAB — CBC WITH DIFFERENTIAL (CANCER CENTER ONLY)
Abs Immature Granulocytes: 0.01 10*3/uL (ref 0.00–0.07)
Basophils Absolute: 0 10*3/uL (ref 0.0–0.1)
Basophils Relative: 0 %
Eosinophils Absolute: 0.1 10*3/uL (ref 0.0–0.5)
Eosinophils Relative: 2 %
HCT: 44.9 % (ref 39.0–52.0)
Hemoglobin: 14.7 g/dL (ref 13.0–17.0)
Immature Granulocytes: 0 %
Lymphocytes Relative: 21 %
Lymphs Abs: 1.5 10*3/uL (ref 0.7–4.0)
MCH: 29.3 pg (ref 26.0–34.0)
MCHC: 32.7 g/dL (ref 30.0–36.0)
MCV: 89.4 fL (ref 80.0–100.0)
Monocytes Absolute: 0.7 10*3/uL (ref 0.1–1.0)
Monocytes Relative: 10 %
Neutro Abs: 4.6 10*3/uL (ref 1.7–7.7)
Neutrophils Relative %: 67 %
Platelet Count: 211 10*3/uL (ref 150–400)
RBC: 5.02 MIL/uL (ref 4.22–5.81)
RDW: 15.1 % (ref 11.5–15.5)
WBC Count: 6.9 10*3/uL (ref 4.0–10.5)
nRBC: 0 % (ref 0.0–0.2)

## 2019-03-31 LAB — IRON AND TIBC
Iron: 61 ug/dL (ref 42–163)
Saturation Ratios: 16 % — ABNORMAL LOW (ref 20–55)
TIBC: 380 ug/dL (ref 202–409)
UIBC: 319 ug/dL (ref 117–376)

## 2019-03-31 LAB — VITAMIN B12: Vitamin B-12: 344 pg/mL (ref 180–914)

## 2019-03-31 LAB — FERRITIN: Ferritin: 54 ng/mL (ref 24–336)

## 2019-03-31 MED ORDER — CYANOCOBALAMIN 1000 MCG/ML IJ SOLN
1000.0000 ug | Freq: Once | INTRAMUSCULAR | Status: AC
  Administered 2019-03-31: 1000 ug via INTRAMUSCULAR

## 2019-03-31 MED ORDER — CYANOCOBALAMIN 1000 MCG/ML IJ SOLN
INTRAMUSCULAR | Status: AC
  Filled 2019-03-31: qty 1

## 2019-03-31 NOTE — Patient Instructions (Signed)
Cyanocobalamin, Pyridoxine, and Folate What is this medicine? A multivitamin containing folic acid, vitamin B6, and vitamin B12. This medicine may be used for other purposes; ask your health care provider or pharmacist if you have questions. COMMON BRAND NAME(S): AllanFol RX, AllanTex, Av-Vite FB, B Complex with Folic Acid, ComBgen, FaBB, Folamin, Folastin, Folbalin, Folbee, Folbic, Folcaps, Folgard, Folgard RX, Folgard RX 2.2, Folplex, Folplex 2.2, Foltabs 800, Foltx, Homocysteine Formula, Niva-Fol, NuFol, TL Gard RX, Virt-Gard, Virt-Vite, Virt-Vite Forte, Vita-Respa What should I tell my health care provider before I take this medicine? They need to know if you have any of these conditions:  bleeding or clotting disorder  history of anemia of any type  other chronic health condition  an unusual or allergic reaction to vitamins, other medicines, foods, dyes, or preservatives  pregnant or trying to get pregnant  breast-feeding How should I use this medicine? Take by mouth with a glass of water. May take with food. Follow the directions on the prescription label. It is usually given once a day. Do not take your medicine more often than directed. Contact your pediatrician regarding the use of this medicine in children. Special care may be needed. Overdosage: If you think you have taken too much of this medicine contact a poison control center or emergency room at once. NOTE: This medicine is only for you. Do not share this medicine with others. What if I miss a dose? If you miss a dose, take it as soon as you can. If it is almost time for your next dose, take only that dose. Do not take double or extra doses. What may interact with this medicine?  levodopa This list may not describe all possible interactions. Give your health care provider a list of all the medicines, herbs, non-prescription drugs, or dietary supplements you use. Also tell them if you smoke, drink alcohol, or use illegal  drugs. Some items may interact with your medicine. What should I watch for while using this medicine? See your health care professional for regular checks on your progress. Remember that vitamin supplements do not replace the need for good nutrition from a balanced diet. What side effects may I notice from receiving this medicine? Side effects that you should report to your doctor or health care professional as soon as possible:  allergic reaction such as skin rash or difficulty breathing  vomiting Side effects that usually do not require medical attention (report to your doctor or health care professional if they continue or are bothersome):  nausea  stomach upset This list may not describe all possible side effects. Call your doctor for medical advice about side effects. You may report side effects to FDA at 1-800-FDA-1088. Where should I keep my medicine? Keep out of the reach of children. Most vitamins should be stored at controlled room temperature. Check your specific product directions. Protect from heat and moisture. Throw away any unused medicine after the expiration date. NOTE: This sheet is a summary. It may not cover all possible information. If you have questions about this medicine, talk to your doctor, pharmacist, or health care provider.  2020 Elsevier/Gold Standard (2007-11-06 00:59:55)  

## 2019-04-04 ENCOUNTER — Ambulatory Visit (INDEPENDENT_AMBULATORY_CARE_PROVIDER_SITE_OTHER): Payer: Medicare HMO | Admitting: *Deleted

## 2019-04-04 DIAGNOSIS — I5022 Chronic systolic (congestive) heart failure: Secondary | ICD-10-CM

## 2019-04-04 DIAGNOSIS — I255 Ischemic cardiomyopathy: Secondary | ICD-10-CM

## 2019-04-05 ENCOUNTER — Telehealth: Payer: Self-pay

## 2019-04-05 NOTE — Telephone Encounter (Signed)
Left message for patient to remind of missed remote transmission.  

## 2019-04-06 LAB — CUP PACEART REMOTE DEVICE CHECK
Battery Remaining Longevity: 51 mo
Battery Remaining Percentage: 62 %
Battery Voltage: 2.93 V
Brady Statistic AP VP Percent: 1 %
Brady Statistic AP VS Percent: 76 %
Brady Statistic AS VP Percent: 1 %
Brady Statistic AS VS Percent: 23 %
Brady Statistic RA Percent Paced: 51 %
Brady Statistic RV Percent Paced: 16 %
Date Time Interrogation Session: 20200708075418
HighPow Impedance: 62 Ohm
HighPow Impedance: 62 Ohm
Implantable Lead Implant Date: 20090708
Implantable Lead Implant Date: 20090708
Implantable Lead Location: 753859
Implantable Lead Location: 753860
Implantable Lead Model: 7122
Implantable Pulse Generator Implant Date: 20170106
Lead Channel Impedance Value: 340 Ohm
Lead Channel Impedance Value: 360 Ohm
Lead Channel Pacing Threshold Amplitude: 0.5 V
Lead Channel Pacing Threshold Amplitude: 1 V
Lead Channel Pacing Threshold Pulse Width: 0.5 ms
Lead Channel Pacing Threshold Pulse Width: 0.9 ms
Lead Channel Sensing Intrinsic Amplitude: 12 mV
Lead Channel Sensing Intrinsic Amplitude: 2 mV
Lead Channel Setting Pacing Amplitude: 2 V
Lead Channel Setting Pacing Amplitude: 2.5 V
Lead Channel Setting Pacing Pulse Width: 0.9 ms
Lead Channel Setting Sensing Sensitivity: 0.5 mV
Pulse Gen Serial Number: 7306179

## 2019-04-11 ENCOUNTER — Encounter: Payer: Self-pay | Admitting: Cardiology

## 2019-04-11 NOTE — Progress Notes (Signed)
Remote ICD transmission.   

## 2019-04-27 ENCOUNTER — Encounter (HOSPITAL_COMMUNITY): Payer: Self-pay | Admitting: Emergency Medicine

## 2019-04-27 ENCOUNTER — Observation Stay (HOSPITAL_BASED_OUTPATIENT_CLINIC_OR_DEPARTMENT_OTHER): Payer: Medicare HMO

## 2019-04-27 ENCOUNTER — Observation Stay (HOSPITAL_COMMUNITY): Payer: Medicare HMO

## 2019-04-27 ENCOUNTER — Other Ambulatory Visit: Payer: Self-pay

## 2019-04-27 ENCOUNTER — Observation Stay (HOSPITAL_COMMUNITY)
Admission: EM | Admit: 2019-04-27 | Discharge: 2019-04-28 | Disposition: A | Discharge status: Home / Self Care | Payer: Medicare HMO | Attending: Internal Medicine | Admitting: Internal Medicine

## 2019-04-27 ENCOUNTER — Emergency Department (HOSPITAL_COMMUNITY): Payer: Medicare HMO

## 2019-04-27 DIAGNOSIS — Z1159 Encounter for screening for other viral diseases: Secondary | ICD-10-CM | POA: Insufficient documentation

## 2019-04-27 DIAGNOSIS — I2510 Atherosclerotic heart disease of native coronary artery without angina pectoris: Secondary | ICD-10-CM | POA: Insufficient documentation

## 2019-04-27 DIAGNOSIS — Z7901 Long term (current) use of anticoagulants: Secondary | ICD-10-CM | POA: Insufficient documentation

## 2019-04-27 DIAGNOSIS — Z95810 Presence of automatic (implantable) cardiac defibrillator: Secondary | ICD-10-CM | POA: Diagnosis not present

## 2019-04-27 DIAGNOSIS — I361 Nonrheumatic tricuspid (valve) insufficiency: Secondary | ICD-10-CM

## 2019-04-27 DIAGNOSIS — Z79899 Other long term (current) drug therapy: Secondary | ICD-10-CM | POA: Diagnosis not present

## 2019-04-27 DIAGNOSIS — I480 Paroxysmal atrial fibrillation: Secondary | ICD-10-CM | POA: Diagnosis not present

## 2019-04-27 DIAGNOSIS — Z8249 Family history of ischemic heart disease and other diseases of the circulatory system: Secondary | ICD-10-CM | POA: Insufficient documentation

## 2019-04-27 DIAGNOSIS — Z951 Presence of aortocoronary bypass graft: Secondary | ICD-10-CM | POA: Diagnosis not present

## 2019-04-27 DIAGNOSIS — I252 Old myocardial infarction: Secondary | ICD-10-CM | POA: Insufficient documentation

## 2019-04-27 DIAGNOSIS — N189 Chronic kidney disease, unspecified: Secondary | ICD-10-CM | POA: Insufficient documentation

## 2019-04-27 DIAGNOSIS — I5042 Chronic combined systolic (congestive) and diastolic (congestive) heart failure: Secondary | ICD-10-CM | POA: Diagnosis not present

## 2019-04-27 DIAGNOSIS — Z87891 Personal history of nicotine dependence: Secondary | ICD-10-CM | POA: Diagnosis not present

## 2019-04-27 DIAGNOSIS — I255 Ischemic cardiomyopathy: Secondary | ICD-10-CM | POA: Insufficient documentation

## 2019-04-27 DIAGNOSIS — G450 Vertebro-basilar artery syndrome: Principal | ICD-10-CM | POA: Insufficient documentation

## 2019-04-27 DIAGNOSIS — E1122 Type 2 diabetes mellitus with diabetic chronic kidney disease: Secondary | ICD-10-CM | POA: Diagnosis not present

## 2019-04-27 DIAGNOSIS — I639 Cerebral infarction, unspecified: Secondary | ICD-10-CM | POA: Diagnosis present

## 2019-04-27 DIAGNOSIS — I700 Atherosclerosis of aorta: Secondary | ICD-10-CM | POA: Insufficient documentation

## 2019-04-27 DIAGNOSIS — I340 Nonrheumatic mitral (valve) insufficiency: Secondary | ICD-10-CM | POA: Diagnosis not present

## 2019-04-27 DIAGNOSIS — I6502 Occlusion and stenosis of left vertebral artery: Secondary | ICD-10-CM

## 2019-04-27 DIAGNOSIS — I130 Hypertensive heart and chronic kidney disease with heart failure and stage 1 through stage 4 chronic kidney disease, or unspecified chronic kidney disease: Secondary | ICD-10-CM | POA: Diagnosis not present

## 2019-04-27 DIAGNOSIS — I10 Essential (primary) hypertension: Secondary | ICD-10-CM | POA: Diagnosis present

## 2019-04-27 DIAGNOSIS — R42 Dizziness and giddiness: Secondary | ICD-10-CM | POA: Diagnosis present

## 2019-04-27 DIAGNOSIS — E1351 Other specified diabetes mellitus with diabetic peripheral angiopathy without gangrene: Secondary | ICD-10-CM | POA: Diagnosis present

## 2019-04-27 DIAGNOSIS — I4892 Unspecified atrial flutter: Secondary | ICD-10-CM | POA: Insufficient documentation

## 2019-04-27 LAB — CBC WITH DIFFERENTIAL/PLATELET
Abs Immature Granulocytes: 0.02 10*3/uL (ref 0.00–0.07)
Basophils Absolute: 0 10*3/uL (ref 0.0–0.1)
Basophils Relative: 0 %
Eosinophils Absolute: 0.1 10*3/uL (ref 0.0–0.5)
Eosinophils Relative: 2 %
HCT: 44 % (ref 39.0–52.0)
Hemoglobin: 14.3 g/dL (ref 13.0–17.0)
Immature Granulocytes: 0 %
Lymphocytes Relative: 19 %
Lymphs Abs: 1.3 10*3/uL (ref 0.7–4.0)
MCH: 29.5 pg (ref 26.0–34.0)
MCHC: 32.5 g/dL (ref 30.0–36.0)
MCV: 90.7 fL (ref 80.0–100.0)
Monocytes Absolute: 0.7 10*3/uL (ref 0.1–1.0)
Monocytes Relative: 11 %
Neutro Abs: 4.6 10*3/uL (ref 1.7–7.7)
Neutrophils Relative %: 68 %
Platelets: 192 10*3/uL (ref 150–400)
RBC: 4.85 MIL/uL (ref 4.22–5.81)
RDW: 15.5 % (ref 11.5–15.5)
WBC: 6.8 10*3/uL (ref 4.0–10.5)
nRBC: 0 % (ref 0.0–0.2)

## 2019-04-27 LAB — COMPREHENSIVE METABOLIC PANEL
ALT: 11 U/L (ref 0–44)
AST: 15 U/L (ref 15–41)
Albumin: 4.2 g/dL (ref 3.5–5.0)
Alkaline Phosphatase: 98 U/L (ref 38–126)
Anion gap: 8 (ref 5–15)
BUN: 15 mg/dL (ref 8–23)
CO2: 26 mmol/L (ref 22–32)
Calcium: 8.8 mg/dL — ABNORMAL LOW (ref 8.9–10.3)
Chloride: 104 mmol/L (ref 98–111)
Creatinine, Ser: 0.99 mg/dL (ref 0.61–1.24)
GFR calc Af Amer: >60 mL/min (ref >60)
GFR calc non Af Amer: >60 mL/min (ref >60)
Glucose, Bld: 269 mg/dL — ABNORMAL HIGH (ref 70–99)
Potassium: 3.7 mmol/L (ref 3.5–5.1)
Sodium: 138 mmol/L (ref 135–145)
Total Bilirubin: 0.5 mg/dL (ref 0.3–1.2)
Total Protein: 6.9 g/dL (ref 6.5–8.1)

## 2019-04-27 LAB — HEMOGLOBIN A1C
Hgb A1c MFr Bld: 7.8 % — ABNORMAL HIGH (ref 4.8–5.6)
Hgb A1c MFr Bld: 8 % — ABNORMAL HIGH (ref 4.8–5.6)
Mean Plasma Glucose: 177.16 mg/dL
Mean Plasma Glucose: 182.9 mg/dL

## 2019-04-27 LAB — LIPID PANEL
Cholesterol: 116 mg/dL (ref 0–200)
HDL: 35 mg/dL — ABNORMAL LOW (ref >40)
LDL Cholesterol: 67 mg/dL (ref 0–99)
Total CHOL/HDL Ratio: 3.3 RATIO
Triglycerides: 69 mg/dL (ref <150)
VLDL: 14 mg/dL (ref 0–40)

## 2019-04-27 LAB — CBG MONITORING, ED
Glucose-Capillary: 137 mg/dL — ABNORMAL HIGH (ref 70–99)
Glucose-Capillary: 138 mg/dL — ABNORMAL HIGH (ref 70–99)

## 2019-04-27 LAB — PROTIME-INR
INR: 1.2 (ref 0.8–1.2)
Prothrombin Time: 15.5 seconds — ABNORMAL HIGH (ref 11.4–15.2)

## 2019-04-27 LAB — GLUCOSE, CAPILLARY
Glucose-Capillary: 190 mg/dL — ABNORMAL HIGH (ref 70–99)
Glucose-Capillary: 87 mg/dL (ref 70–99)

## 2019-04-27 LAB — ECHOCARDIOGRAM COMPLETE
Height: 73 in
Weight: 3280 oz

## 2019-04-27 LAB — TROPONIN I (HIGH SENSITIVITY)
Troponin I (High Sensitivity): 4 ng/L (ref <18)
Troponin I (High Sensitivity): 4 ng/L (ref <18)

## 2019-04-27 LAB — TSH: TSH: 5.923 u[IU]/mL — ABNORMAL HIGH (ref 0.350–4.500)

## 2019-04-27 LAB — T4, FREE: Free T4: 0.94 ng/dL (ref 0.61–1.12)

## 2019-04-27 LAB — VITAMIN B12: Vitamin B-12: 357 pg/mL (ref 180–914)

## 2019-04-27 LAB — CK: Total CK: 82 U/L (ref 49–397)

## 2019-04-27 LAB — SARS CORONAVIRUS 2 BY RT PCR (HOSPITAL ORDER, PERFORMED IN ~~LOC~~ HOSPITAL LAB): SARS Coronavirus 2: NEGATIVE

## 2019-04-27 MED ORDER — SODIUM CHLORIDE 0.9 % IV SOLN
INTRAVENOUS | Status: AC
  Administered 2019-04-27 – 2019-04-28 (×3): via INTRAVENOUS

## 2019-04-27 MED ORDER — INSULIN ASPART 100 UNIT/ML ~~LOC~~ SOLN
0.0000 [IU] | Freq: Three times a day (TID) | SUBCUTANEOUS | Status: DC
  Administered 2019-04-27: 2 [IU] via SUBCUTANEOUS
  Administered 2019-04-27: 3 [IU] via SUBCUTANEOUS
  Filled 2019-04-27: qty 1

## 2019-04-27 MED ORDER — STROKE: EARLY STAGES OF RECOVERY BOOK
Freq: Once | Status: DC
  Filled 2019-04-27: qty 1

## 2019-04-27 MED ORDER — MECLIZINE HCL 12.5 MG PO TABS
25.0000 mg | ORAL_TABLET | Freq: Once | ORAL | Status: AC
  Administered 2019-04-27: 25 mg via ORAL
  Filled 2019-04-27: qty 2

## 2019-04-27 MED ORDER — BACLOFEN 10 MG PO TABS
40.0000 mg | ORAL_TABLET | Freq: Every day | ORAL | Status: DC
  Administered 2019-04-27: 40 mg via ORAL
  Filled 2019-04-27: qty 4

## 2019-04-27 MED ORDER — FERROUS SULFATE 325 (65 FE) MG PO TABS
325.0000 mg | ORAL_TABLET | Freq: Two times a day (BID) | ORAL | Status: DC
  Administered 2019-04-27 – 2019-04-28 (×2): 325 mg via ORAL
  Filled 2019-04-27 (×6): qty 1

## 2019-04-27 MED ORDER — SODIUM CHLORIDE 0.9 % IV BOLUS
250.0000 mL | Freq: Once | INTRAVENOUS | Status: AC
  Administered 2019-04-27: 250 mL via INTRAVENOUS

## 2019-04-27 MED ORDER — IOHEXOL 350 MG/ML SOLN
100.0000 mL | Freq: Once | INTRAVENOUS | Status: AC | PRN
  Administered 2019-04-27: 100 mL via INTRAVENOUS

## 2019-04-27 NOTE — Plan of Care (Signed)
  Problem: Acute Rehab PT Goals(only PT should resolve) Goal: Pt Will Go Supine/Side To Sit Outcome: Progressing Flowsheets (Taken 04/27/2019 1539) Pt will go Supine/Side to Sit: Independently Goal: Patient Will Transfer Sit To/From Stand Outcome: Progressing Flowsheets (Taken 04/27/2019 1539) Patient will transfer sit to/from stand: with modified independence Goal: Pt Will Transfer Bed To Chair/Chair To Bed Outcome: Progressing Flowsheets (Taken 04/27/2019 1539) Pt will Transfer Bed to Chair/Chair to Bed: with modified independence Goal: Pt Will Ambulate Outcome: Progressing Flowsheets (Taken 04/27/2019 1539) Pt will Ambulate:  100 feet  with modified independence  with rolling walker Note: Use RW if necessary   3:40 PM, 04/27/19 Lonell Grandchild, MPT Physical Therapist with [facility] [phone] office [phone] mobile phone

## 2019-04-27 NOTE — ED Triage Notes (Signed)
Pt states he has had dizziness for the past couple of days. Pt states he takes 25 mg of meclizine and it helps some but he states he still feels dizzy. Pt denies any falls.

## 2019-04-27 NOTE — H&P (Signed)
History and Physical    [name]:[id number] DOB: 03-Mar-1946 DOA: 04/27/2019  PCP: [name], DO (Inactive) Patient coming from: Home  Chief Complaint: Dizziness  HPI: [name] is a 73 y.o. 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  Patient tells me that he has been feeling dizzy for several months for which he takes meclizine without much relief.  Over the course the past week he feels like " swimming in his head" therefore came to the hospital for further evaluation.  Denies any syncope, chest pain, palpitation and other complaints.  He does have right eye blurry vision that is no worse than in the past.  In the ER patient reported of quite a bit of dizziness.  His routine labs are unremarkable but his CTA of the head and neck showed left-sided vertebral occlusion/stenosis.  Right-sided circulation appeared to be adequate.  Case was discussed with overnight neurology recommended MRI.  After discussion with MRI tech and looking at his previous history, his AICD is not compatible.  Spoke with Dr. Gerilyn Pilgrim from neurology who recommends admitting the patient for observation and monitoring for his symptoms.  Review of Systems: As per HPI otherwise 10 point review of systems negative.  Review of Systems Otherwise negative except as per HPI, including: General: Denies fever, chills, night sweats or unintended weight loss. Resp: Denies cough, wheezing, shortness of breath. Cardiac: Denies chest pain, palpitations, orthopnea, paroxysmal nocturnal dyspnea. GI: Denies abdominal pain, nausea, vomiting, diarrhea or constipation GU: Denies dysuria, frequency, hesitancy or  incontinence MS: Denies muscle aches, joint pain or swelling Neuro: Dizziness, denies headaches Psych: Denies anxiety, depression, SI/HI/AVH Skin: Denies new rashes or lesions ID: Denies sick contacts, exotic exposures, travel  Past Medical History:  Diagnosis Date   Anemia    Multifactorial  - hx of B12 def and iron deficiency, followed by heme, followed [facility]   Automatic implantable cardioverter-defibrillator in situ    CAD (coronary artery disease)    a. STEMI s/p CABG 07/2007 (had preop cardiogenic shock, IABP, VDRF before surgery)   Cardiomyopathy, ischemic    a. Chronic systolic CHF s/p St. Jude dual chamber ICD 03/2008.   Carotid stenosis    a. Prior carotid dz/ surgery. b. Carotid dopp 07/2011 - 0-39% bilaterally.;   c. Doppler 11/13: 40-59% RICA, 0-39% LICA   Chronic renal insufficiency    Diabetes mellitus    GI bleed    Reported history   Hx of CABG    Hypertension    [place] [facility]    Mitral regurgitation    Myocardial infarction [facility])    Neuromuscular disorder (HCC)    R sided weakness    Osteoarthritis    PAF (paroxysmal atrial fibrillation) (HCC)    a. Poor Coumadin candidate due to hx of GI bleed.   Sinus bradycardia    Sleep apnea    study done, 10 yrs ago, unable to tolerate CPAP   Splenomegaly    Stroke [facility]    a. CVA 1990s - chronic pain in RUE after stroke.   Systolic CHF, chronic (HCC)    Previously taken off lisinopril by primary doctor due to labs   Tobacco abuse     Past Surgical History:  Procedure Laterality Date  CARDIAC DEFIBRILLATOR PLACEMENT  04/05/2008   Implantation of a St. Jude dual-chamber defibrillator, Doylene CanningGregg W. Ladona Ridgelaylor , MD   CARDIAC DEFIBRILLATOR PLACEMENT     CHOLECYSTECTOMY N/A 02/19/2016   Procedure: LAPAROSCOPIC CHOLECYSTECTOMY;  Surgeon: Abigail Miyamotoouglas Blackman, MD;  [facility];  Service: General;  Laterality: N/A;   COLONOSCOPY  07/2007   Rourk: left-sided diverticulum. TI  normal. No etiology for IDA.   CORONARY ARTERY BYPASS GRAFT     CORONARY ARTERY BYPASS GRAFT  08/29/2007   x3; Marilu Favrelarence H. Cornelius Moraswen MD   EP IMPLANTABLE DEVICE N/A 10/05/2015   Procedure: ICD Generator Changeout;  Surgeon: Marinus MawGregg W Taylor, MD;  [facility];  Service: Cardiovascular;  Laterality: N/A;   ERCP N/A 02/18/2016   Procedure: ENDOSCOPIC RETROGRADE CHOLANGIOPANCREATOGRAPHY (ERCP);  Surgeon: Vida RiggerMarc Magod, MD;  [facility];  Service: Endoscopy;  Laterality: N/A;   ESOPHAGOGASTRODUODENOSCOPY  07/2007   Rourk: small hh, atrophic gastric mucosa, poor distensibility of stomach ?extrinsic compression, CT showed mild to moderate splenomegaly. No etiology for IDA.   JOINT REPLACEMENT  1990's   L knee   LAPAROSCOPIC APPENDECTOMY N/A 09/14/2017   Procedure: APPENDECTOMY LAPAROSCOPIC;  Surgeon: Jimmye NormanWyatt, James, MD;  Location: [facility] OR;  Service: General;  Laterality: N/A;   Percutaneous coronary intervention  01/10/2005   Charlies ConstableBruce Brodie, MD   TONSILLECTOMY     TOTAL KNEE ARTHROPLASTY Right 08/16/2013   Procedure: RIGHT TOTAL KNEE ARTHROPLASTY;  Surgeon: Kathryne Hitchhristopher Y Blackman, MD;  [facility];  Service: Orthopedics;  Laterality: Right;    SOCIAL HISTORY:  reports that he quit smoking about 28 years ago. He has a 15.00 pack-year smoking history. He quit smokeless tobacco use about 30 years ago. He reports that he does not drink alcohol or use drugs.  Allergies  Allergen Reactions   Codeine Nausea And Vomiting    FAMILY HISTORY: Family History  Problem Relation Age of Onset   Heart attack Mother        CVA, MI   Diabetes Mother    Heart attack Father        MI   Diabetes Sister    Coronary artery disease Brother        CABG     Prior to Admission medications   Medication Sig Start Date End Date Taking? Authorizing Provider  baclofen (LIORESAL) 20 MG tablet Take 20 mg by mouth 2 (two) times daily.     [provider]  carvedilol (COREG)  12.5 MG tablet Take 1 tablet (12.5 mg total) by mouth 2 (two) times daily with a meal. Please keep upcoming appt in April with Dr. Ladona Ridgelaylor for future refills. Thank you 11/09/18   Marinus Mawaylor, Gregg W, MD  furosemide (LASIX) 40 MG tablet Take 40 mg by mouth daily as needed for fluid.     [provider]  glimepiride (AMARYL) 4 MG tablet Take 4 mg by mouth daily with breakfast.    [provider]  linagliptin (TRADJENTA) 5 MG TABS tablet Take 5 mg by mouth daily after breakfast.     [provider]  meclizine (ANTIVERT) 25 MG tablet Take 25 mg by mouth 4 (four) times daily as needed. For dizziness 02/26/12   [provider]  methocarbamol (ROBAXIN) 500 MG tablet Take 1 tablet (500 mg total) by mouth every 8 (eight) hours as needed for muscle spasms. 03/12/18   Loren RacerYelverton, David, MD  oxyCODONE-acetaminophen (PERCOCET) 10-325 MG per tablet Take 1 tablet by mouth every 4 (four) hours as needed for  pain.  01/26/14   [provider]  polyethylene glycol (MIRALAX / GLYCOLAX) packet Take 17 g by mouth daily. 02/22/16   Rolly Salter, MD  rivaroxaban (XARELTO) 20 MG TABS tablet Take 1 tablet (20 mg total) by mouth daily with supper. 10/11/18   Marinus Maw, MD  SENNA CO Take 1 tablet by mouth daily as needed (constipation).  01/10/14   [provider]  spironolactone (ALDACTONE) 25 MG tablet Take 1 tablet (25 mg total) by mouth every morning. 07/20/14   Marinus Maw, MD    Physical Exam: Vitals:   04/27/19 0700 04/27/19 0730 04/27/19 0800 04/27/19 0815  BP: 115/68 110/66 128/65   Pulse: 71 70  71  Resp: 16 15  18   Temp:      TempSrc:      SpO2: 100% 99% 100% 99%  Weight:      Height:          Constitutional: NAD, calm, comfortable Eyes: PERRL, lids and conjunctivae normal ENMT: Mucous membranes are moist. Posterior pharynx clear of any exudate or lesions.Normal dentition.  Neck: normal, supple, no masses, no thyromegaly Respiratory: clear to  auscultation bilaterally, no wheezing, no crackles. Normal respiratory effort. No accessory muscle use.  Cardiovascular: Regular rate and rhythm, no murmurs / rubs / gallops.  Bilateral lower extremity swelling with 2+ pitting edema,. 2+ pedal pulses. No carotid bruits.  Abdomen: no tenderness, no masses palpated. No hepatosplenomegaly. Bowel sounds positive.  Musculoskeletal: no clubbing / cyanosis. No joint deformity upper and lower extremities. Good ROM, no contractures. Normal muscle tone.  Skin: no rashes, lesions, ulcers. No induration Neurologic: CN 2-12 grossly intact. Sensation intact, DTR normal. Strength 4+/5 in all 4.  Psychiatric: Normal judgment and insight. Alert and oriented x 3. Normal mood.     Labs on Admission: I have personally reviewed following labs and imaging studies  CBC: Recent Labs  Lab 04/27/19 0153  WBC 6.8  NEUTROABS 4.6  HGB 14.3  HCT 44.0  MCV 90.7  PLT 192   Basic Metabolic Panel: Recent Labs  Lab 04/27/19 0153  NA 138  K 3.7  CL 104  CO2 26  GLUCOSE 269*  BUN 15  CREATININE 0.99  CALCIUM 8.8*   GFR: Estimated Creatinine Clearance: 77.3 mL/min (by C-G formula based on SCr of 0.99 mg/dL). Liver Function Tests: Recent Labs  Lab 04/27/19 0153  AST 15  ALT 11  ALKPHOS 98  BILITOT 0.5  PROT 6.9  ALBUMIN 4.2   No results for input(s): LIPASE, AMYLASE in the last 168 hours. No results for input(s): AMMONIA in the last 168 hours. Coagulation Profile: Recent Labs  Lab 04/27/19 0153  INR 1.2   Cardiac Enzymes: Recent Labs  Lab 04/27/19 0153  CKTOTAL 82   BNP (last 3 results) No results for input(s): PROBNP in the last 8760 hours. HbA1C: No results for input(s): HGBA1C in the last 72 hours. CBG: Recent Labs  Lab 04/27/19 0838  GLUCAP 137*   Lipid Profile: Recent Labs    04/27/19 0153  CHOL 116  HDL 35*  LDLCALC 67  TRIG 69  CHOLHDL 3.3   Thyroid Function Tests: Recent Labs    04/27/19 0153  TSH 5.923*    Anemia Panel: Recent Labs    04/27/19 0153  VITAMINB12 357   Urine analysis:    Component Value Date/Time   COLORURINE STRAW (A) 03/12/2018 1906   APPEARANCEUR CLEAR 03/12/2018 1906   LABSPEC 1.006 03/12/2018 1906   PHURINE  7.0 03/12/2018 1906   GLUCOSEU NEGATIVE 03/12/2018 1906   HGBUR NEGATIVE 03/12/2018 1906   BILIRUBINUR NEGATIVE 03/12/2018 1906   KETONESUR NEGATIVE 03/12/2018 1906   PROTEINUR NEGATIVE 03/12/2018 1906   UROBILINOGEN 0.2 08/09/2013 1012   NITRITE NEGATIVE 03/12/2018 1906   LEUKOCYTESUR NEGATIVE 03/12/2018 1906   Sepsis Labs: !!!!!!!!!!!!!!!!!!!!!!!!!!!!!!!!!!!!!!!!!!!! @LABRCNTIP (procalcitonin:4,lacticidven:4) ) Recent Results (from the past 240 hour(s))  SARS Coronavirus 2 (CEPHEID - Performed in [facility] hospital lab), Hosp Order     Status: None   Collection Time: 04/27/19  5:37 AM   Specimen: Nasopharyngeal Swab  Result Value Ref Range Status   SARS Coronavirus 2 NEGATIVE NEGATIVE Final    Comment: (NOTE) If result is NEGATIVE SARS-CoV-2 target nucleic acids are NOT DETECTED. The SARS-CoV-2 RNA is generally detectable in upper and lower  respiratory specimens during the acute phase of infection. The lowest  concentration of SARS-CoV-2 viral copies this assay can detect is 250  copies / mL. A negative result does not preclude SARS-CoV-2 infection  and should not be used as the sole basis for treatment or other  patient management decisions.  A negative result may occur with  improper specimen collection / handling, submission of specimen other  than nasopharyngeal swab, presence of viral mutation(s) within the  areas targeted by this assay, and inadequate number of viral copies  (<250 copies / mL). A negative result must be combined with clinical  observations, patient history, and epidemiological information. If result is POSITIVE SARS-CoV-2 target nucleic acids are DETECTED. The SARS-CoV-2 RNA is generally detectable in upper and lower   respiratory specimens dur ing the acute phase of infection.  Positive  results are indicative of active infection with SARS-CoV-2.  Clinical  correlation with patient history and other diagnostic information is  necessary to determine patient infection status.  Positive results do  not rule out bacterial infection or co-infection with other viruses. If result is PRESUMPTIVE POSTIVE SARS-CoV-2 nucleic acids MAY BE PRESENT.   A presumptive positive result was obtained on the submitted specimen  and confirmed on repeat testing.  While 2019 novel coronavirus  (SARS-CoV-2) nucleic acids may be present in the submitted sample  additional confirmatory testing may be necessary for epidemiological  and / or clinical management purposes  to differentiate between  SARS-CoV-2 and other Sarbecovirus currently known to infect humans.  If clinically indicated additional testing with an alternate test  methodology [phone]) is advised. The SARS-CoV-2 RNA is generally  detectable in upper and lower respiratory sp ecimens during the acute  phase of infection. The expected result is Negative. Fact Sheet for Patients:  BoilerBrush.com.cy Fact Sheet for Healthcare Providers: https://pope.com/ This test is not yet approved or cleared by the [place] FDA and has been authorized for detection and/or diagnosis of SARS-CoV-2 by FDA under an Emergency Use Authorization (EUA).  This EUA will remain in effect (meaning this test can be used) for the duration of the COVID-19 declaration under Section 564(b)(1) of the Act, 21 U.S.C. section 360bbb-3(b)(1), unless the authorization is terminated or revoked sooner. Performed at [facility], [address] [place], [place] [address]      Radiological Exams on Admission: Ct Angio Head W Or Wo Contrast  Result Date: 04/27/2019 CLINICAL DATA:  Vertigo, persistent, central. Dizziness for the past few days. Patient  denies any falls. EXAM: CT ANGIOGRAPHY HEAD AND NECK TECHNIQUE: Multidetector CT imaging of the head and neck was performed using the standard protocol during bolus administration of intravenous contrast. Multiplanar CT  image reconstructions and MIPs were obtained to evaluate the vascular anatomy. Carotid stenosis measurements (when applicable) are obtained utilizing NASCET criteria, using the distal internal carotid diameter as the denominator. CONTRAST:  100mL OMNIPAQUE IOHEXOL 350 MG/ML SOLN COMPARISON:  CT head 11/18/2013 FINDINGS: CT HEAD FINDINGS Brain: Noncontrast imaging of the head demonstrates remote lacunar infarcts involving the left thalamus and internal capsule. There is associated volume loss. No acute infarct, hemorrhage, or mass lesion is present. Ex vacuo dilation is present in the left lateral ventricle. No significant extraaxial fluid collection is present. The brainstem and cerebellum are within normal limits. Vascular: Atherosclerotic calcifications are present within the cavernous internal carotid arteries bilaterally. There is no hyperdense vessel. Skull: Calvarium is intact. No focal lytic or blastic lesions are present. Sinuses: Is chronic opacification of the right sphenoid sinus. The paranasal sinuses and mastoid air cells are otherwise clear. Orbits: The globes and orbits are within normal limits. Review of the MIP images confirms the above findings CTA NECK FINDINGS Aortic arch: Atherosclerotic calcifications are present at the aortic arch without aneurysm or significant stenosis involving the great vessel origins. Right carotid system: Additional calcifications are present at the bifurcation of the innominate artery without significant stenosis. There is some tortuosity of the right common carotid artery. Mild atherosclerotic disease is present at the right carotid bifurcation without a significant stenosis relative to the more distal right ICA. The right ICA is otherwise within normal  limits. Left carotid system: The left common carotid artery is within normal limits. Noncalcified plaque is present in the posterior aspect of the left carotid bifurcation. There is no significant stenosis relative to the more distal vessel. The cervical left ICA is otherwise normal. Vertebral arteries: The right vertebral artery is the dominant vessel. There is no significant stenosis at its origin of the right subclavian artery. The left vertebral artery is occluded. A hypoplastic vessel is reconstituted at the level of C2. Skeleton: Degenerative changes are noted in the cervical spine with slight anterolisthesis at C4-5. Chronic endplate changes are present at C5-6 with uncovertebral spurring an osseous foraminal narrowing, left greater than right. The patient is edentulous. No focal lytic or blastic lesions are present. Other neck: Thyroid is mildly heterogeneous without a dominant lesion. No significant adenopathy is present. Salivary glands are within normal limits. No focal mucosal or submucosal lesions are present. Surgical clips are noted in the right lower neck. Upper chest: The lung apices are clear. Thoracic inlet is within normal limits. Pacing wires are noted. Review of the MIP images confirms the above findings CTA HEAD FINDINGS Anterior circulation: Atherosclerotic calcifications are present within the cavernous internal carotid arteries bilaterally without a significant stenosis relative to the more distal vessel. ICA termini are within normal limits. The A1 and M1 segments are normal. The anterior communicating artery is patent. The MCA bifurcations are intact. ACA and MCA branch vessels are within normal limits. Posterior circulation: The right vertebral artery is the dominant vessel. Is moderate stenosis of the left V4 segment just distal to the PICA origin. Vertebrobasilar junction is intact. The basilar artery is normal. Both posterior cerebral arteries originate from basilar tip. There is  asymmetric attenuation of left PCA branch vessels without a significant proximal stenosis or occlusion Venous sinuses: Dural sinuses are patent. The straight sinus deep cerebral veins are intact. Cortical veins are unremarkable. Anatomic variants: None Review of the MIP images confirms the above findings IMPRESSION: 1. 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 2. Moderate stenosis of the residual left vertebral artery just distal to the left PICA. 3. Dominant right vertebral artery without significant stenosis through the basilar tip. 4. Left-sided stenosis and occlusion the vertebral artery may be associated with remote posterior circulation infarcts on the left. This could contribute to ongoing vertebrobasilar symptoms. 5. Atherosclerotic changes at the aortic arch, carotid bifurcations, and cavernous internal carotid arteries without a significant stenosis in the anterior circulation. Aortic Atherosclerosis (ICD10-I70.0). 6. No significant proximal stenosis, aneurysm, or branch vessel occlusion within the anterior circulation of the circle-of-Willis. 7. Multilevel degenerative changes within the cervical spine as described. Electronically Signed   By: Marin Roberts M.D.   On: 04/27/2019 04:22   Ct Angio Neck W And/or Wo Contrast  Result Date: 04/27/2019 CLINICAL DATA:  Vertigo, persistent, central. Dizziness for the past few days. Patient denies any falls. EXAM: CT ANGIOGRAPHY HEAD AND NECK TECHNIQUE: Multidetector CT imaging of the head and neck was performed using the standard protocol during bolus administration of intravenous contrast. Multiplanar CT image reconstructions and MIPs were obtained to evaluate the vascular anatomy. Carotid stenosis measurements (when applicable) are obtained utilizing NASCET criteria, using the distal internal carotid diameter as the denominator. CONTRAST:  OMNIPAQUE IOHEXOL 350 MG/ML SOLN  COMPARISON:  CT head 11/18/2013 FINDINGS: CT HEAD FINDINGS Brain: Noncontrast imaging of the head demonstrates remote lacunar infarcts involving the left thalamus and internal capsule. There is associated volume loss. No acute infarct, hemorrhage, or mass lesion is present. Ex vacuo dilation is present in the left lateral ventricle. No significant extraaxial fluid collection is present. The brainstem and cerebellum are within normal limits. Vascular: Atherosclerotic calcifications are present within the cavernous internal carotid arteries bilaterally. There is no hyperdense vessel. Skull: Calvarium is intact. No focal lytic or blastic lesions are present. Sinuses: Is chronic opacification of the right sphenoid sinus. The paranasal sinuses and mastoid air cells are otherwise clear. Orbits: The globes and orbits are within normal limits. Review of the MIP images confirms the above findings CTA NECK FINDINGS Aortic arch: Atherosclerotic calcifications are present at the aortic arch without aneurysm or significant stenosis involving the great vessel origins. Right carotid system: Additional calcifications are present at the bifurcation of the innominate artery without significant stenosis. There is some tortuosity of the right common carotid artery. Mild atherosclerotic disease is present at the right carotid bifurcation without a significant stenosis relative to the more distal right ICA. The right ICA is otherwise within normal limits. Left carotid system: The left common carotid artery is within normal limits. Noncalcified plaque is present in the posterior aspect of the left carotid bifurcation. There is no significant stenosis relative to the more distal vessel. The cervical left ICA is otherwise normal. Vertebral arteries: The right vertebral artery is the dominant vessel. There is no significant stenosis at its origin of the right subclavian artery. The left vertebral artery is occluded. A hypoplastic vessel is  reconstituted at the level of C2. Skeleton: Degenerative changes are noted in the cervical spine with slight anterolisthesis at C4-5. Chronic endplate changes are present at C5-6 with uncovertebral spurring an osseous foraminal narrowing, left greater than right. The patient is edentulous. No focal lytic or blastic lesions are present. Other neck: Thyroid is mildly heterogeneous without a dominant lesion. No significant adenopathy is present. Salivary glands are within normal limits. No focal mucosal or submucosal lesions are present. Surgical clips are noted in the right lower neck. Upper chest:  The lung apices are clear. Thoracic inlet is within normal limits. Pacing wires are noted. Review of the MIP images confirms the above findings CTA HEAD FINDINGS Anterior circulation: Atherosclerotic calcifications are present within the cavernous internal carotid arteries bilaterally without a significant stenosis relative to the more distal vessel. ICA termini are within normal limits. The A1 and M1 segments are normal. The anterior communicating artery is patent. The MCA bifurcations are intact. ACA and MCA branch vessels are within normal limits. Posterior circulation: The right vertebral artery is the dominant vessel. Is moderate stenosis of the left V4 segment just distal to the PICA origin. Vertebrobasilar junction is intact. The basilar artery is normal. Both posterior cerebral arteries originate from basilar tip. There is asymmetric attenuation of left PCA branch vessels without a significant proximal stenosis or occlusion Venous sinuses: Dural sinuses are patent. The straight sinus deep cerebral veins are intact. Cortical veins are unremarkable. Anatomic variants: None Review of the MIP images confirms the above findings IMPRESSION: 1. 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 2. Moderate stenosis of the residual left vertebral artery just  distal to the left PICA. 3. Dominant right vertebral artery without significant stenosis through the basilar tip. 4. Left-sided stenosis and occlusion the vertebral artery may be associated with remote posterior circulation infarcts on the left. This could contribute to ongoing vertebrobasilar symptoms. 5. Atherosclerotic changes at the aortic arch, carotid bifurcations, and cavernous internal carotid arteries without a significant stenosis in the anterior circulation. Aortic Atherosclerosis (ICD10-I70.0). 6. No significant proximal stenosis, aneurysm, or branch vessel occlusion within the anterior circulation of the circle-of-Willis. 7. Multilevel degenerative changes within the cervical spine as described. Electronically Signed   By: San Morelle M.D.   On: 04/27/2019 04:22     All images have been reviewed by me personally.  EKG: Independently reviewed.  No acute ST-T changes  Assessment/Plan Principal Problem:   Dizziness Active Problems:   DM (diabetes mellitus), secondary, with peripheral vascular complications (HCC)   Essential hypertension   CORONARY ARTERY BYPASS GRAFT, HX OF   CVA (cerebral vascular accident) ([facility])   Vertebral artery obstruction, left    Dizziness with vertebral artery stenosis, left Vertebral basilar symptoms - Admit the patient for observation - CTA head of the head and neck-shows left vertebral artery occlusion at the origin, moderate stenosis of the residual left vertebral artery just distal to left PICA, dominant right vertebral artery without significant stenosis.  Likely contributing to his vertebrobasilar symptoms. - On Xarelto at home.  Possibly we can add aspirin?.  Defer this to neurology. -Check hemoglobin A1c, lipid panel. -PT/OT -Gentle hydration. -Supportive care, permissive hypertension. - TSH slightly elevated, will check T4. - Echocardiogram  Iron deficiency, low saturation Hemoglobin stable, not necessarily anemic -Patient is at  risk of developing anemia given low iron stores.  We will start him on iron supplements with bowel regimen.  Recheck iron studies in about 3 months and further adjustment can be made.  Coronary artery disease status post bypass - Coreg, Lasix, Aldactone.  Await pharmacy to confirm these.  Paroxysmal atrial fibrillation - On Xarelto at home.  Currently rate is controlled. 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 - Echocardiogram 2016 showed ejection fraction 16-01%, grade 2 diastolic dysfunction -Currently appears to be euvolemic -On Coreg 12.5 mg twice daily, Lasix 40 mg as needed.  Awaiting pharmacy to confirm this. -Aldactone 25 mg daily.  Diabetes mellitus type 2 -Checking hemoglobin A1c -Hold his home Amaryl, Tradjenta -Insulin sliding scale and Accu-Chek.  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  May need to add aspirin?.   DVT prophylaxis: SCDs, once cleared by neurology will resume Xarelto Code Status: Full code Family Communication: None at bedside Disposition Plan: Observe him for at least 24 hours in the hospital, in the meanwhile await neurology input.  Likely discharge tomorrow. Consults called: Neurology Admission status: Currently we will place the patient under observation.   Time Spent: 65 minutes.  >50% of the time was devoted to discussing the patients care, assessment, plan and disposition with other care givers along with counseling the patient about the risks and benefits of treatment.    Delta Deshmukh Joline Maxcy MD [facility]  If 7PM-7AM, please contact night-coverage [email or web address]  04/27/2019, 9:10 AM

## 2019-04-27 NOTE — Progress Notes (Signed)
*  PRELIMINARY RESULTS* Echocardiogram 2D Echocardiogram has been performed.  Samuel Germany 04/27/2019, 1:14 PM

## 2019-04-27 NOTE — ED Notes (Signed)
ECHO at bedside.

## 2019-04-27 NOTE — ED Notes (Signed)
St jude here to interrogate pacemaker

## 2019-04-27 NOTE — ED Notes (Signed)
Pt ambulated in room approx 5 feet. Pt states he wasn't able to walk far because hes "swimmy headed".

## 2019-04-27 NOTE — Progress Notes (Signed)
Upon admit- none of patients home medications were ordered. K. Schorr paged to see if Baclofen could be ordered for pt per his request. Waiting for call back/orders. Will continue to monitor pt.

## 2019-04-27 NOTE — Care Management CC44 (Signed)
Condition Code 44 Documentation Completed  Patient Details  Name: [name] MRN: [id number] Date of Birth: 1946-07-15   Condition Code 44 given:  Yes Patient signature on Condition Code 44 notice:  Yes Documentation of 2 MD's agreement:  Yes Code 44 added to claim:  Yes    Annastacia Duba, Chauncey Reading, RN 04/27/2019, 1:40 PM

## 2019-04-27 NOTE — ED Provider Notes (Signed)
[facility] EMERGENCY DEPARTMENT Provider Note   CSN: [id number] Arrival date & time: 04/27/19  0034     History   Chief Complaint Chief Complaint  Patient presents with   Dizziness    HPI [name] is a 73 y.o. male.     Patient with history of CAD status post CABG, AICD implant for ischemic cardiomyopathy, carotid stenosis, diabetes, atrial fibrillation on xarelto, previous stroke presenting with dizziness described as vertigo worse with position change.  He states he has this chronically and takes meclizine on a regular basis.  However over the past several days he had increased dizziness with generalized weakness and difficulty walking.  He is been taking meclizine every 6 or 8 hours without relief.  He states he does take meclizine every day.  He said nausea but no vomiting.  No focal weakness, numbness or tingling.  No chest pain or shortness of breath.  No vision changes or eye pain.  No difficulty speaking or difficulty swallowing.  The dizziness is positional and worse when he goes from a lying to standing position.  The history is provided by the patient and a relative.    Past Medical History:  Diagnosis Date   Anemia    Multifactorial  - hx of B12 def and iron deficiency, followed by heme, followed at [facility]   Automatic implantable cardioverter-defibrillator in situ    CAD (coronary artery disease)    a. STEMI s/p CABG 07/2007 (had preop cardiogenic shock, IABP, VDRF before surgery)   Cardiomyopathy, ischemic    a. Chronic systolic CHF s/p St. Jude dual chamber ICD 03/2008.   Carotid stenosis    a. Prior carotid dz/ surgery. b. Carotid dopp 07/2011 - 0-39% bilaterally.;   c. Doppler 11/13: 40-59% RICA, 0-39% LICA   Chronic renal insufficiency    Diabetes mellitus    GI bleed    Reported history   Hx of CABG    Hypertension    [facility]    Mitral regurgitation    Myocardial infarction [facility])    Neuromuscular disorder  (HCC)    R sided weakness    Osteoarthritis    PAF (paroxysmal atrial fibrillation) (HCC)    a. Poor Coumadin candidate due to hx of GI bleed.   Sinus bradycardia    Sleep apnea    study done, 10 yrs ago, unable to tolerate CPAP   Splenomegaly    Stroke [facility])    a. CVA 1990s - chronic pain in RUE after stroke.   Systolic CHF, chronic (HCC)    Previously taken off lisinopril by primary doctor due to labs   Tobacco abuse     Patient Active Problem List   Diagnosis Date Noted   Acute appendicitis, uncomplicated 09/14/2017   Dry eye 02/20/2016   CHF (congestive heart failure) (HCC)    Elevated LFTs 02/13/2016   Hypokalemia 02/13/2016   Chronic combined systolic and diastolic CHF (congestive heart failure) (HCC) 02/13/2016   Prolonged Q-T interval on ECG 02/13/2016   Biliary colic 02/13/2016   Left ear pain 02/13/2016   Thrombocytopenia (HCC) 02/13/2016   Right knee pain 10/13/2013   Abnormality of gait 10/13/2013   Stiffness of right knee 10/13/2013   Arthritis of knee, right 08/16/2013   Cholecystitis with cholelithiasis 03/07/2013   Automatic implantable cardioverter-defibrillator in situ 03/13/2009   DM (diabetes mellitus), secondary, with peripheral vascular complications (HCC) 03/12/2009   Iron deficiency anemia 03/12/2009   Essential hypertension 03/12/2009  CARDIOMYOPATHY, ISCHEMIC 03/12/2009   CEREBROVASCULAR DISEASE 03/12/2009   PVD 03/12/2009   RENAL INSUFFICIENCY, CHRONIC 03/12/2009   OSTEOARTHRITIS 03/12/2009   CORONARY ARTERY BYPASS GRAFT, HX OF 03/12/2009    Past Surgical History:  Procedure Laterality Date   CARDIAC DEFIBRILLATOR PLACEMENT  04/05/2008   Implantation of a St. Jude dual-chamber defibrillator, Doylene Canning. Ladona Ridgel , MD   CARDIAC DEFIBRILLATOR PLACEMENT     CHOLECYSTECTOMY N/A 02/19/2016   Procedure: LAPAROSCOPIC CHOLECYSTECTOMY;  Surgeon: Abigail Miyamoto, MD;  Location: [facility] OR;  Service: General;  Laterality:  N/A;   COLONOSCOPY  07/2007   Rourk: left-sided diverticulum. TI normal. No etiology for IDA.   CORONARY ARTERY BYPASS GRAFT     CORONARY ARTERY BYPASS GRAFT  08/29/2007   x3; Marilu Favre H. Cornelius Moras MD   EP IMPLANTABLE DEVICE N/A 10/05/2015   Procedure: ICD Generator Changeout;  Surgeon: Marinus Maw, MD;  Location: [facility] INVASIVE CV LAB;  Service: Cardiovascular;  Laterality: N/A;   ERCP N/A 02/18/2016   Procedure: ENDOSCOPIC RETROGRADE CHOLANGIOPANCREATOGRAPHY (ERCP);  Surgeon: Vida Rigger, MD;  Location: [facility] ENDOSCOPY;  Service: Endoscopy;  Laterality: N/A;   ESOPHAGOGASTRODUODENOSCOPY  07/2007   Rourk: small hh, atrophic gastric mucosa, poor distensibility of stomach ?extrinsic compression, CT showed mild to moderate splenomegaly. No etiology for IDA.   JOINT REPLACEMENT  1990's   L knee   LAPAROSCOPIC APPENDECTOMY N/A 09/14/2017   Procedure: APPENDECTOMY LAPAROSCOPIC;  Surgeon: Jimmye Norman, MD;  Location: [facility] OR;  Service: General;  Laterality: N/A;   Percutaneous coronary intervention  01/10/2005   Charlies Constable, MD   TONSILLECTOMY     TOTAL KNEE ARTHROPLASTY Right 08/16/2013   Procedure: RIGHT TOTAL KNEE ARTHROPLASTY;  Surgeon: Kathryne Hitch, MD;  Location: [facility] OR;  Service: Orthopedics;  Laterality: Right;        Home Medications    Prior to Admission medications   Medication Sig Start Date End Date Taking? Authorizing Provider  baclofen (LIORESAL) 20 MG tablet Take 20 mg by mouth 2 (two) times daily.     [provider]  carvedilol (COREG) 12.5 MG tablet Take 1 tablet (12.5 mg total) by mouth 2 (two) times daily with a meal. Please keep upcoming appt in April with Dr. Ladona Ridgel for future refills. Thank you 11/09/18   Marinus Maw, MD  furosemide (LASIX) 40 MG tablet Take 40 mg by mouth daily as needed for fluid.     [provider]  glimepiride (AMARYL) 4 MG tablet Take 4 mg by mouth daily with breakfast.    [provider]  linagliptin  (TRADJENTA) 5 MG TABS tablet Take 5 mg by mouth daily after breakfast.     [provider]  meclizine (ANTIVERT) 25 MG tablet Take 25 mg by mouth 4 (four) times daily as needed. For dizziness 02/26/12   [provider]  methocarbamol (ROBAXIN) 500 MG tablet Take 1 tablet (500 mg total) by mouth every 8 (eight) hours as needed for muscle spasms. 03/12/18   Loren Racer, MD  oxyCODONE-acetaminophen (PERCOCET) 10-325 MG per tablet Take 1 tablet by mouth every 4 (four) hours as needed for pain.  01/26/14   [provider]  polyethylene glycol (MIRALAX / GLYCOLAX) packet Take 17 g by mouth daily. 02/22/16   Rolly Salter, MD  rivaroxaban (XARELTO) 20 MG TABS tablet Take 1 tablet (20 mg total) by mouth daily with supper. 10/11/18   Marinus Maw, MD  SENNA CO Take 1 tablet by mouth daily as needed (constipation).  01/10/14   [provider]  spironolactone (ALDACTONE) 25 MG tablet Take 1 tablet (25 mg total) by mouth every morning. 07/20/14   Marinus Mawaylor, Gregg W, MD    Family History Family History  Problem Relation Age of Onset   Heart attack Mother        CVA, MI   Diabetes Mother    Heart attack Father        MI   Diabetes Sister    Coronary artery disease Brother        CABG    Social History Social History   Tobacco Use   Smoking status: Former Smoker    Packs/day: 1.00    Years: 15.00    Pack years: 15.00    Quit date: 09/29/1990    Years since quitting: 28.5   Smokeless tobacco: Former NeurosurgeonUser    Quit date: 08/09/1988  Substance Use Topics   Alcohol use: No   Drug use: No     Allergies   Codeine   Review of Systems Review of Systems  Constitutional: Positive for fatigue. Negative for activity change, appetite change and fever.  HENT: Negative for congestion and rhinorrhea.   Respiratory: Negative for chest tightness and shortness of breath.   Cardiovascular: Positive for palpitations and leg swelling. Negative for chest pain.    Gastrointestinal: Positive for nausea. Negative for abdominal pain and vomiting.  Genitourinary: Negative for dysuria and hematuria.  Neurological: Positive for dizziness and light-headedness. Negative for facial asymmetry, weakness and headaches.   all other systems are negative except as noted in the HPI and PMH.     Physical Exam Updated Vital Signs BP (!) 141/81 (BP Location: Right Arm)    Pulse 89    Temp 97.9 F (36.6 C) (Oral)    Resp 17    Ht 6\' 1"  (1.854 m)    Wt 93 kg    SpO2 97%    BMI 27.05 kg/m   Physical Exam Vitals signs and nursing note reviewed.  Constitutional:      General: He is not in acute distress.    Appearance: Normal appearance. He is well-developed and normal weight.  HENT:     Head: Normocephalic and atraumatic.     Mouth/Throat:     Pharynx: No oropharyngeal exudate.  Eyes:     Conjunctiva/sclera: Conjunctivae normal.     Pupils: Pupils are equal, round, and reactive to light.  Neck:     Musculoskeletal: Normal range of motion and neck supple.     Comments: No meningismus Cardiovascular:     Rate and Rhythm: Normal rate. Rhythm irregular.     Heart sounds: Normal heart sounds. No murmur.     Comments: +2 femoral,  Monitor with what appears to be atrial flutter at a rate of 90s  Pulmonary:     Effort: Pulmonary effort is normal. No respiratory distress.     Breath sounds: Normal breath sounds.  Abdominal:     Palpations: Abdomen is soft.     Tenderness: There is no abdominal tenderness. There is no guarding or rebound.  Musculoskeletal: Normal range of motion.        General: No tenderness.     Right lower leg: Edema present.     Left lower leg: Edema present.     Comments: +2 edema bilaterally which he states is baseline  Skin:    General: Skin is warm and dry.     Capillary Refill: Capillary refill takes less than 2 seconds.  Findings: No rash.  Neurological:     Mental Status: He is alert and oriented to person, place, and time.      Cranial Nerves: No cranial nerve deficit.     Motor: No abnormal muscle tone.     Coordination: Coordination normal.     Comments: CN 2-12 intact, no ataxia on finger to nose, no nystagmus, 5/5 strength throughout, no pronator drift,  No appreciable nystagmus.  No ataxia on finger-to-nose.  Head impulse testing negative, test of skew negative. Positive Romberg, gait not tested  Psychiatric:        Behavior: Behavior normal.      ED Treatments / Results  Labs (all labs ordered are listed, but only abnormal results are displayed) Labs Reviewed  COMPREHENSIVE METABOLIC PANEL - Abnormal; Notable for the following components:      Result Value   Glucose, Bld 269 (*)    Calcium 8.8 (*)    All other components within normal limits  PROTIME-INR - Abnormal; Notable for the following components:   Prothrombin Time 15.5 (*)    All other components within normal limits  LIPID PANEL - Abnormal; Notable for the following components:   HDL 35 (*)    All other components within normal limits  TSH - Abnormal; Notable for the following components:   TSH 5.923 (*)    All other components within normal limits  CBG MONITORING, ED - Abnormal; Notable for the following components:   Glucose-Capillary 137 (*)    All other components within normal limits  SARS CORONAVIRUS 2 (HOSPITAL ORDER, PERFORMED IN [facility])  CBC WITH DIFFERENTIAL/PLATELET  CK  VITAMIN B12  HEMOGLOBIN A1C  HEMOGLOBIN A1C  FOLATE RBC  TROPONIN I (HIGH SENSITIVITY)  TROPONIN I (HIGH SENSITIVITY)    EKG EKG Interpretation  Date/Time:  Wednesday April 27 2019 01:17:16 EDT Ventricular Rate:  94 PR Interval:    QRS Duration: 112 QT Interval:  388 QTC Calculation: 451 R Axis:   -139 Text Interpretation:  Atrial flutter Anterolateral infarct, age indeterminate Atrial flutter Nonspecific ST abnormality Confirmed by Glynn Octaveancour, Eero Dini [phone]([id number]) on 04/27/2019 1:43:14 AM   Radiology Ct Angio Head W Or Wo  Contrast  Result Date: 04/27/2019 CLINICAL DATA:  Vertigo, persistent, central. Dizziness for the past few days. Patient denies any falls. EXAM: CT ANGIOGRAPHY HEAD AND NECK TECHNIQUE: Multidetector CT imaging of the head and neck was performed using the standard protocol during bolus administration of intravenous contrast. Multiplanar CT image reconstructions and MIPs were obtained to evaluate the vascular anatomy. Carotid stenosis measurements (when applicable) are obtained utilizing NASCET criteria, using the distal internal carotid diameter as the denominator. CONTRAST:  100mL OMNIPAQUE IOHEXOL 350 MG/ML SOLN COMPARISON:  CT head 11/18/2013 FINDINGS: CT HEAD FINDINGS Brain: Noncontrast imaging of the head demonstrates remote lacunar infarcts involving the left thalamus and internal capsule. There is associated volume loss. No acute infarct, hemorrhage, or mass lesion is present. Ex vacuo dilation is present in the left lateral ventricle. No significant extraaxial fluid collection is present. The brainstem and cerebellum are within normal limits. Vascular: Atherosclerotic calcifications are present within the cavernous internal carotid arteries bilaterally. There is no hyperdense vessel. Skull: Calvarium is intact. No focal lytic or blastic lesions are present. Sinuses: Is chronic opacification of the right sphenoid sinus. The paranasal sinuses and mastoid air cells are otherwise clear. Orbits: The globes and orbits are within normal limits. Review of the MIP images confirms the above findings CTA NECK FINDINGS Aortic arch:  Atherosclerotic calcifications are present at the aortic arch without aneurysm or significant stenosis involving the great vessel origins. Right carotid system: Additional calcifications are present at the bifurcation of the innominate artery without significant stenosis. There is some tortuosity of the right common carotid artery. Mild atherosclerotic disease is present at the right carotid  bifurcation without a significant stenosis relative to the more distal right ICA. The right ICA is otherwise within normal limits. Left carotid system: The left common carotid artery is within normal limits. Noncalcified plaque is present in the posterior aspect of the left carotid bifurcation. There is no significant stenosis relative to the more distal vessel. The cervical left ICA is otherwise normal. Vertebral arteries: The right vertebral artery is the dominant vessel. There is no significant stenosis at its origin of the right subclavian artery. The left vertebral artery is occluded. A hypoplastic vessel is reconstituted at the level of C2. Skeleton: Degenerative changes are noted in the cervical spine with slight anterolisthesis at C4-5. Chronic endplate changes are present at C5-6 with uncovertebral spurring an osseous foraminal narrowing, left greater than right. The patient is edentulous. No focal lytic or blastic lesions are present. Other neck: Thyroid is mildly heterogeneous without a dominant lesion. No significant adenopathy is present. Salivary glands are within normal limits. No focal mucosal or submucosal lesions are present. Surgical clips are noted in the right lower neck. Upper chest: The lung apices are clear. Thoracic inlet is within normal limits. Pacing wires are noted. Review of the MIP images confirms the above findings CTA HEAD FINDINGS Anterior circulation: Atherosclerotic calcifications are present within the cavernous internal carotid arteries bilaterally without a significant stenosis relative to the more distal vessel. ICA termini are within normal limits. The A1 and M1 segments are normal. The anterior communicating artery is patent. The MCA bifurcations are intact. ACA and MCA branch vessels are within normal limits. Posterior circulation: The right vertebral artery is the dominant vessel. Is moderate stenosis of the left V4 segment just distal to the PICA origin. Vertebrobasilar  junction is intact. The basilar artery is normal. Both posterior cerebral arteries originate from basilar tip. There is asymmetric attenuation of left PCA branch vessels without a significant proximal stenosis or occlusion Venous sinuses: Dural sinuses are patent. The straight sinus deep cerebral veins are intact. Cortical veins are unremarkable. Anatomic variants: None Review of the MIP images confirms the above findings IMPRESSION: 1. 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 2. Moderate stenosis of the residual left vertebral artery just distal to the left PICA. 3. Dominant right vertebral artery without significant stenosis through the basilar tip. 4. Left-sided stenosis and occlusion the vertebral artery may be associated with remote posterior circulation infarcts on the left. This could contribute to ongoing vertebrobasilar symptoms. 5. Atherosclerotic changes at the aortic arch, carotid bifurcations, and cavernous internal carotid arteries without a significant stenosis in the anterior circulation. Aortic Atherosclerosis (ICD10-I70.0). 6. No significant proximal stenosis, aneurysm, or branch vessel occlusion within the anterior circulation of the circle-of-Willis. 7. Multilevel degenerative changes within the cervical spine as described. Electronically Signed   By: Marin Robertshristopher  Mattern M.D.   On: 04/27/2019 04:22   Ct Angio Neck W And/or Wo Contrast  Result Date: 04/27/2019 CLINICAL DATA:  Vertigo, persistent, central. Dizziness for the past few days. Patient denies any falls. EXAM: CT ANGIOGRAPHY HEAD AND NECK TECHNIQUE: Multidetector CT imaging of the head and neck was performed using the  standard protocol during bolus administration of intravenous contrast. Multiplanar CT image reconstructions and MIPs were obtained to evaluate the vascular anatomy. Carotid stenosis measurements (when applicable) are obtained utilizing NASCET  criteria, using the distal internal carotid diameter as the denominator. CONTRAST:  149mL OMNIPAQUE IOHEXOL 350 MG/ML SOLN COMPARISON:  CT head 11/18/2013 FINDINGS: CT HEAD FINDINGS Brain: Noncontrast imaging of the head demonstrates remote lacunar infarcts involving the left thalamus and internal capsule. There is associated volume loss. No acute infarct, hemorrhage, or mass lesion is present. Ex vacuo dilation is present in the left lateral ventricle. No significant extraaxial fluid collection is present. The brainstem and cerebellum are within normal limits. Vascular: Atherosclerotic calcifications are present within the cavernous internal carotid arteries bilaterally. There is no hyperdense vessel. Skull: Calvarium is intact. No focal lytic or blastic lesions are present. Sinuses: Is chronic opacification of the right sphenoid sinus. The paranasal sinuses and mastoid air cells are otherwise clear. Orbits: The globes and orbits are within normal limits. Review of the MIP images confirms the above findings CTA NECK FINDINGS Aortic arch: Atherosclerotic calcifications are present at the aortic arch without aneurysm or significant stenosis involving the great vessel origins. Right carotid system: Additional calcifications are present at the bifurcation of the innominate artery without significant stenosis. There is some tortuosity of the right common carotid artery. Mild atherosclerotic disease is present at the right carotid bifurcation without a significant stenosis relative to the more distal right ICA. The right ICA is otherwise within normal limits. Left carotid system: The left common carotid artery is within normal limits. Noncalcified plaque is present in the posterior aspect of the left carotid bifurcation. There is no significant stenosis relative to the more distal vessel. The cervical left ICA is otherwise normal. Vertebral arteries: The right vertebral artery is the dominant vessel. There is no  significant stenosis at its origin of the right subclavian artery. The left vertebral artery is occluded. A hypoplastic vessel is reconstituted at the level of C2. Skeleton: Degenerative changes are noted in the cervical spine with slight anterolisthesis at C4-5. Chronic endplate changes are present at C5-6 with uncovertebral spurring an osseous foraminal narrowing, left greater than right. The patient is edentulous. No focal lytic or blastic lesions are present. Other neck: Thyroid is mildly heterogeneous without a dominant lesion. No significant adenopathy is present. Salivary glands are within normal limits. No focal mucosal or submucosal lesions are present. Surgical clips are noted in the right lower neck. Upper chest: The lung apices are clear. Thoracic inlet is within normal limits. Pacing wires are noted. Review of the MIP images confirms the above findings CTA HEAD FINDINGS Anterior circulation: Atherosclerotic calcifications are present within the cavernous internal carotid arteries bilaterally without a significant stenosis relative to the more distal vessel. ICA termini are within normal limits. The A1 and M1 segments are normal. The anterior communicating artery is patent. The MCA bifurcations are intact. ACA and MCA branch vessels are within normal limits. Posterior circulation: The right vertebral artery is the dominant vessel. Is moderate stenosis of the left V4 segment just distal to the PICA origin. Vertebrobasilar junction is intact. The basilar artery is normal. Both posterior cerebral arteries originate from basilar tip. There is asymmetric attenuation of left PCA branch vessels without a significant proximal stenosis or occlusion Venous sinuses: Dural sinuses are patent. The straight sinus deep cerebral veins are intact. Cortical veins are unremarkable. Anatomic variants: None Review of the MIP images confirms the above findings IMPRESSION: 1. 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 2. Moderate stenosis of the residual left vertebral artery just distal to the left PICA. 3. Dominant right vertebral artery without significant stenosis through the basilar tip. 4. Left-sided stenosis and occlusion the vertebral artery may be associated with remote posterior circulation infarcts on the left. This could contribute to ongoing vertebrobasilar symptoms. 5. Atherosclerotic changes at the aortic arch, carotid bifurcations, and cavernous internal carotid arteries without a significant stenosis in the anterior circulation. Aortic Atherosclerosis (ICD10-I70.0). 6. No significant proximal stenosis, aneurysm, or branch vessel occlusion within the anterior circulation of the circle-of-Willis. 7. Multilevel degenerative changes within the cervical spine as described. Electronically Signed   By: Christopher  Mattern M.D.   On: Marin Roberts   Procedures Procedures (including critical care time)  Medications Ordered in ED Medications - No data to display   Initial Impression / Assessment and Plan / ED Course  I have reviewed the triage vital signs and the nursing notes.  Pertinent labs & imaging results that were available during my care of the patient were reviewed by me and considered in my medical decision making (see chart for details).       Several days of worsening positional vertigo without focal neuro deficits.  EKG with underlying atrial flutter which appears to be new.  Pacemaker interrogation shows that patient has been in rate controlled atrial flutter since October.  No recent ventricular arrhythmias. Cardiology records confirm atrial flutter is not new.  CTA shows multiple areas of vertebral stenosis and occlusion as above.  Patient is already on Xarelto. Denies any missed doses. Questionable chronic infarcts in the posterior circulation also  D/w Dr. Wilford Corner of neurology who reviewed CTA images.  He states  patient does need MRI to rule out acute infarct.  May benefit from inpatient neurology consultation for potential switching of his anticoagulation.  Patient denies any missed doses of his xarelto.   Patient only able to take a couple steps and not ambulate effectively without assistance.  Admission for stroke workup d/w Dr. Sharl Ma. Patient not able to have MRI due to AICD. May benefit from neurology evaluation to address his anticoagulation.   Final Clinical Impressions(s) / ED Diagnoses   Final diagnoses:  Vertigo  VBI (vertebrobasilar insufficiency)    ED Discharge Orders    None       Prestyn Mahn, Jeannett Senior, MD 04/27/19 [phone]

## 2019-04-27 NOTE — ED Notes (Signed)
Unable to find [facility] interrogater in department, called [facility] to get someone to interrogate pace maker. States she will be here in approx. 2 hours.

## 2019-04-27 NOTE — TOC Transition Note (Signed)
Transition of Care [facility]) - CM/SW Discharge Note   Patient Details  Name: [name] MRN: [id number] Date of Birth: January 08, 1946  Transition of Care [facility]) CM/SW Contact:  Jonasia Coiner, Chauncey Reading, RN Phone Number: 04/27/2019, 4:04 PM   Clinical Narrative:   Patient recommended for home health PT. Patient does not feel he needs home health.  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 He drives, has PCP, no issues affording/obtaining medications. Plans to have sister or his daughter pick him up when discharged.     Final next level of care: Home/Self Care Barriers to Discharge: No Barriers Identified   Patient Goals and CMS Choice Patient states their goals for this hospitalization and ongoing recovery are:: return home CMS Medicare.gov Compare Post Acute Care list provided to:: Patient           Readmission Risk Interventions No flowsheet data found.

## 2019-04-27 NOTE — Evaluation (Signed)
Physical Therapy Evaluation Patient Details Name: [name] MRN: [id number] DOB: 08/19/1946 Today's Date: 04/27/2019   History of Present Illness  DRILON USSERY is a 73 y.o. 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  Patient tells me that he has been feeling dizzy for several months for which he takes meclizine without much relief.  Over the course the past week he feels like " swimming in his head" therefore came to the hospital for further evaluation.  Denies any syncope, chest pain, palpitation and other complaints.  He does have right eye blurry vision that is no worse than in the past.    Clinical Impression  Patient functioning near baseline for functional mobility and gait other than c/o mild/moderate dizziness when sitting up, orthostatics as follows: supine 116/64, sitting 126/70, and standing 130/81, patient c/o mild increase in dizziness during ambulation, no loss of balance and limited mostly by fatigue/dizziness.  Patient put back to bed after therapy.  Patient will benefit from continued physical therapy in hospital and recommended venue below to increase strength, balance, endurance for safe ADLs and gait.     Follow Up Recommendations Home health PT    Equipment Recommendations  None recommended by PT    Recommendations for Other Services       Precautions / Restrictions Precautions Precautions: Fall Restrictions Weight Bearing Restrictions: No      Mobility  Bed Mobility Overal bed mobility: Modified Independent             General bed mobility comments: increased time  Transfers Overall transfer level: Needs assistance Equipment used:  None Transfers: Sit to/from Stand;Stand Pivot Transfers Sit to Stand: Supervision Stand pivot transfers: Supervision       General transfer comment: increased time, slightly unsteady movement  Ambulation/Gait Ambulation/Gait assistance: Supervision;Min guard Gait Distance (Feet): 65 Feet Assistive device: None Gait Pattern/deviations: Decreased step length - right;Decreased stance time - right;Decreased stride length;Decreased dorsiflexion - right Gait velocity: decreased   General Gait Details: slightly unsteady cadence with decreased dorsiflexion right foot without loss of balance  Stairs            Wheelchair Mobility    Modified Rankin (Stroke Patients Only)       Balance Overall balance assessment: Mild deficits observed, not formally tested                                           Pertinent Vitals/Pain Pain Assessment: No/denies pain    Home Living Family/patient expects to be discharged to:: Private residence Living Arrangements: Spouse/significant other Available Help at Discharge: Family;Available PRN/intermittently Type of Home: House Home Access: Ramped entrance     Home Layout: One level Home Equipment: Bedside commode;Walker - 2 wheels;Cane - single point;Shower seat      Prior Function Level of Independence: Independent         Comments: Tourist information centre manager, drives     Hand Dominance   Dominant Hand: Left    Extremity/Trunk Assessment   Upper Extremity Assessment Upper Extremity Assessment: Overall WFL for tasks assessed;RUE deficits/detail RUE Deficits / Details: grossly 3+/5, slightly spastic RUE Sensation: decreased proprioception RUE Coordination: decreased fine motor;decreased gross  motor    Lower Extremity Assessment Lower Extremity Assessment: Generalized weakness    Cervical / Trunk Assessment Cervical / Trunk Assessment: Normal  Communication   Communication: No difficulties  Cognition  Arousal/Alertness: Awake/alert Behavior During Therapy: WFL for tasks assessed/performed Overall Cognitive Status: Within Functional Limits for tasks assessed                                        General Comments      Exercises     Assessment/Plan    PT Assessment Patient needs continued PT services  PT Problem List Decreased strength;Decreased activity tolerance;Decreased balance;Decreased mobility       PT Treatment Interventions Gait training;Functional mobility training;Therapeutic activities;Therapeutic exercise;Patient/family education;Stair training    PT Goals (Current goals can be found in the Care Plan section)  Acute Rehab PT Goals Patient Stated Goal: return home PT Goal Formulation: With patient Time For Goal Achievement: 04/02/19 Potential to Achieve Goals: Good    Frequency Min 3X/week   Barriers to discharge        Co-evaluation               AM-PAC PT "6 Clicks" Mobility  Outcome Measure Help needed turning from your back to your side while in a flat bed without using bedrails?: None Help needed moving from lying on your back to sitting on the side of a flat bed without using bedrails?: None Help needed moving to and from a bed to a chair (including a wheelchair)?: A Little Help needed standing up from a chair using your arms (e.g., wheelchair or bedside chair)?: None Help needed to walk in hospital room?: A Little Help needed climbing 3-5 steps with a railing? : A Little 6 Click Score: 21    End of Session   Activity Tolerance: Patient tolerated treatment well;Patient limited by fatigue Patient left: in bed;with call bell/phone within reach;with bed alarm set Nurse Communication: Mobility status PT Visit Diagnosis: Unsteadiness on feet (R26.81);Other abnormalities of gait and mobility (R26.89);Muscle weakness (generalized) (M62.81)    Time: 6948-5462 PT Time Calculation (min) (ACUTE ONLY): 23 min   Charges:   PT  Evaluation $PT Eval Moderate Complexity: 1 Mod PT Treatments $Therapeutic Activity: 23-37 mins        3:33 PM, 04/27/19 Lonell Grandchild, MPT Physical Therapist with [facility] 336 [phone] office [phone] mobile phone

## 2019-04-27 NOTE — Care Management Obs Status (Signed)
[facility] NOTIFICATION   Patient Details  Name: DENSIL OTTEY MRN: [id number] Date of Birth: 23-Aug-1946   Medicare Observation Status Notification Given:  Yes    Tiann Saha, Chauncey Reading, RN 04/27/2019, 1:40 PM

## 2019-04-28 DIAGNOSIS — R42 Dizziness and giddiness: Secondary | ICD-10-CM | POA: Diagnosis not present

## 2019-04-28 LAB — BASIC METABOLIC PANEL
Anion gap: 7 (ref 5–15)
BUN: 13 mg/dL (ref 8–23)
CO2: 28 mmol/L (ref 22–32)
Calcium: 8.6 mg/dL — ABNORMAL LOW (ref 8.9–10.3)
Chloride: 107 mmol/L (ref 98–111)
Creatinine, Ser: 0.88 mg/dL (ref 0.61–1.24)
GFR calc Af Amer: >60 mL/min (ref >60)
GFR calc non Af Amer: >60 mL/min (ref >60)
Glucose, Bld: 89 mg/dL (ref 70–99)
Potassium: 4 mmol/L (ref 3.5–5.1)
Sodium: 142 mmol/L (ref 135–145)

## 2019-04-28 LAB — CBC
HCT: 44.1 % (ref 39.0–52.0)
Hemoglobin: 13.8 g/dL (ref 13.0–17.0)
MCH: 29 pg (ref 26.0–34.0)
MCHC: 31.3 g/dL (ref 30.0–36.0)
MCV: 92.6 fL (ref 80.0–100.0)
Platelets: 189 10*3/uL (ref 150–400)
RBC: 4.76 MIL/uL (ref 4.22–5.81)
RDW: 15.5 % (ref 11.5–15.5)
WBC: 6.7 10*3/uL (ref 4.0–10.5)
nRBC: 0 % (ref 0.0–0.2)

## 2019-04-28 LAB — GLUCOSE, CAPILLARY: Glucose-Capillary: 86 mg/dL (ref 70–99)

## 2019-04-28 LAB — MAGNESIUM: Magnesium: 2.1 mg/dL (ref 1.7–2.4)

## 2019-04-28 MED ORDER — SENNOSIDES-DOCUSATE SODIUM 8.6-50 MG PO TABS: 2.0000 | ORAL_TABLET | Freq: Every day | ORAL | 0 refills | Status: AC | PRN

## 2019-04-28 MED ORDER — FERROUS SULFATE 325 (65 FE) MG PO TABS: 325.0000 mg | ORAL_TABLET | Freq: Two times a day (BID) | ORAL | 0 refills | Status: DC

## 2019-04-28 MED ORDER — ASPIRIN 81 MG PO TBEC: 81.0000 mg | DELAYED_RELEASE_TABLET | Freq: Every day | ORAL | 1 refills | Status: AC

## 2019-04-28 MED ORDER — ASPIRIN EC 81 MG PO TBEC
81.0000 mg | DELAYED_RELEASE_TABLET | Freq: Every day | ORAL | Status: DC
  Administered 2019-04-28: 81 mg via ORAL
  Filled 2019-04-28: qty 1

## 2019-04-28 NOTE — Progress Notes (Signed)
NURSING PROGRESS NOTE  DAISEAN BRODHEAD [id number] Discharge Data: 04/28/2019 11:14 AM Attending Provider: Damita Lack, MD [id number], Caren Griffins, DO (Inactive)     Kizzie Furnish to be D/C'd Home per MD order.  Discussed with the patient the After Visit Summary and all questions fully answered. All IV's discontinued with no bleeding noted. All belongings returned to patient for patient to take home.   Last Vital Signs:  Blood pressure 118/73, pulse 70, temperature 97.8 F (36.6 C), temperature source Oral, resp. rate 17, height 6\' 1"  (1.854 m), weight 93 kg, SpO2 100 %.  Discharge Medication List Allergies as of 04/28/2019      Reactions   Codeine Nausea And Vomiting      Medication List    TAKE these medications   aspirin 81 MG EC tablet Take 1 tablet (81 mg total) by mouth daily.   baclofen 20 MG tablet Commonly known as: LIORESAL Take 40 mg by mouth at bedtime.   carvedilol 12.5 MG tablet Commonly known as: COREG Take 1 tablet (12.5 mg total) by mouth 2 (two) times daily with a meal. Please keep upcoming appt in April with Dr. [name] for future refills. Thank you   ferrous sulfate 325 (65 FE) MG tablet Take 1 tablet (325 mg total) by mouth 2 (two) times daily with a meal.   furosemide 40 MG tablet Commonly known as: LASIX Take 40 mg by mouth daily as needed for fluid.   glimepiride 4 MG tablet Commonly known as: AMARYL Take 4 mg by mouth daily with breakfast.   linagliptin 5 MG Tabs tablet Commonly known as: TRADJENTA Take 5 mg by mouth daily after breakfast.   meclizine 25 MG tablet Commonly known as: ANTIVERT Take 25 mg by mouth 4 (four) times daily as needed. For dizziness   montelukast 10 MG tablet Commonly known as: SINGULAIR Take 10 mg by mouth at bedtime.   oxyCODONE-acetaminophen 5-325 MG tablet Commonly known as: PERCOCET/ROXICET Take a half tablet every 6 hours   rivaroxaban 20 MG Tabs tablet Commonly known as: Xarelto Take 1 tablet (20  mg total) by mouth daily with supper.   SENNA CO Take 1 tablet by mouth daily as needed (constipation).   senna-docusate 8.6-50 MG tablet Commonly known as: Senokot-S Take 2 tablets by mouth daily as needed for mild constipation.   spironolactone 25 MG tablet Commonly known as: ALDACTONE Take 1 tablet (25 mg total) by mouth every morning.        Doristine Devoid, RN

## 2019-04-28 NOTE — Discharge Summary (Signed)
Physician Discharge Summary  [name] GEX:[id number] DOB: May 30, 1946 DOA: 04/27/2019  PCP: [name], DO (Inactive)  Admit date: 04/27/2019 Discharge date: 04/28/2019  Admitted From: Home Disposition: Home with home health  Recommendations for Outpatient Follow-up:  1. Follow up with PCP in 1-2 weeks 2. Please obtain BMP/CBC in one week your next doctors visit.  3. Aspirin 81 mg daily added to Xarelto.  Follow-up outpatient neurology in 3-4 weeks. 4. Iron supplements with bowel regimen.  Home Health: Arranged by case manager Equipment/Devices: None Discharge Condition: Stable CODE STATUS: Full code Diet recommendation: Diabetic  Brief/Interim Summary:  73 y.o. 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 CTA of the head and neck showed left-sided vertebral occlusion/stenosis.  Right-sided circulation appeared to be adequate.  Case was discussed with overnight neurology recommended MRI. After discussion with MRI tech and looking at his previous history, his AICD is not compatible.  Spoke with Dr. Gerilyn Pilgrim from neurology who recommends admitting the patient for observation and monitoring for his symptoms, also adding aspirin to Xarelto.  Patient was eval by physical therapy who recommended home health therefore arrangements made.  Patient is feeling much better this morning no complaints.  I advised him avoid driving.  Continue aspirin and Xarelto and follow-up outpatient with primary care provider in about 2 weeks and also follow-up with outpatient neurology in about 3-4 weeks.  He is previously noted to have low iron saturation with borderline low ferritin therefore iron supplements  were started along with bowel regimen.  Hemoglobin A1c is 8.0, LDL 67, TSH normal.   Discharge Diagnoses:  Principal Problem:   Dizziness Active Problems:   DM (diabetes mellitus), secondary, with peripheral vascular complications (HCC)   Essential hypertension   CORONARY ARTERY BYPASS GRAFT, HX OF   CVA (cerebral vascular accident) (HCC)   Vertebral artery obstruction, left   Dizziness with vertebral artery stenosis, left Vertebral basilar symptoms, improved - CTA head of the head and neck-shows left vertebral artery occlusion at the origin, moderate stenosis of the residual left vertebral artery just distal to left PICA, dominant right vertebral artery without significant stenosis.  Likely contributing to his vertebrobasilar symptoms. -  Continue Xarelto, aspirin added.  Discussed with neurology.  Recommends aspirin at least for next 3 to 6 months.  Follow-up outpatient PCP and neurology. -Lipid panel-67, hemoglobin A1c 8.0. -TSH within normal limits. - Echocardiogram-ejection fraction 30-35%, this is not new.  Iron deficiency, low saturation Hemoglobin stable, not necessarily anemic -Patient is at risk of developing anemia given low iron stores.  We will start him on iron supplements with bowel regimen.  Recheck iron studies in about 3 months and further adjustment can be made.  Coronary artery disease status post bypass - Coreg, Lasix, Aldactone.  Await pharmacy to confirm these.  Paroxysmal atrial fibrillation - On Xarelto at home.  Currently rate is controlled. 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 - Echocardiogram 2016 showed ejection fraction 25-30%, grade 2 diastolic dysfunction.  Repeat echocardiogram this visit showed ejection fraction 30-35%. -Currently appears to be euvolemic -On Coreg 12.5 mg twice daily, Lasix 40 mg as needed.  Awaiting pharmacy to confirm  this. -Aldactone 25 mg daily.  Diabetes mellitus  type 2 -Resume home meds  Essential hypertension - Lasix, Coreg, Aldactone  Previous history of CVA with residual right upper extremity weakness -On Xarelto.    Add baby aspirin.  Spoke with on-call neurology.   Consultations:  Curbside neurology  Subjective: No complaints, wants to go home.  Have instructed him to avoid driving.  Also avoid sudden change in position.  He understands this.  Discharge Exam: Vitals:   04/28/19 0004 04/28/19 0404  BP: 121/74 118/73  Pulse: 70 70  Resp: 18 17  Temp: 97.7 F (36.5 C) 97.8 F (36.6 C)  SpO2: 100% 100%   Vitals:   04/27/19 1400 04/27/19 2000 04/28/19 0004 04/28/19 0404  BP: 123/78 125/73 121/74 118/73  Pulse: 67 70 70 70  Resp: 19 16 18 17   Temp: 98 F (36.7 C) 97.7 F (36.5 C) 97.7 F (36.5 C) 97.8 F (36.6 C)  TempSrc: Oral Oral Oral Oral  SpO2: 100% 99% 100% 100%  Weight:      Height:        General: Pt is alert, awake, not in acute distress Cardiovascular: RRR, S1/S2 +, no rubs, no gallops Respiratory: CTA bilaterally, no wheezing, no rhonchi Abdominal: Soft, NT, ND, bowel sounds + Extremities: no edema, no cyanosis  Discharge Instructions  Discharge Instructions    Call MD for:  difficulty breathing, headache or visual disturbances   Complete by: As directed    Call MD for:  persistant dizziness or light-headedness   Complete by: As directed    Call MD for:  redness, tenderness, or signs of infection (pain, swelling, redness, odor or green/yellow discharge around incision site)   Complete by: As directed    Diet - low sodium heart healthy   Complete by: As directed    Discharge instructions   Complete by: As directed    You were cared for by a hospitalist during your hospital stay. If you have any questions about your discharge medications or the care you received while you were in the hospital after you are discharged, you can call the unit and  asked to speak with the hospitalist on call if the hospitalist that took care of you is not available. Once you are discharged, your primary care physician will handle any further medical issues. Please note that NO REFILLS for any discharge medications will be authorized once you are discharged, as it is imperative that you return to your primary care physician (or establish a relationship with a primary care physician if you do not have one) for your aftercare needs so that they can reassess your need for medications and monitor your lab values.  Please request your Prim.MD to go over all Hospital Tests and Procedure/Radiological results at the follow up, please get all Hospital records sent to your Prim MD by signing hospital release before you go home.  Get CBC, CMP, 2 view Chest X ray checked  by Primary MD during your next visit or SNF MD in 5-7 days ( we routinely change or add medications that can affect your baseline labs and fluid status, therefore we recommend that you get the mentioned basic workup next visit with your PCP, your PCP may decide not to get them or add new tests based on their clinical decision)  On your next visit with your primary care physician please Get Medicines reviewed and adjusted.  If you experience worsening of your admission symptoms, develop shortness of breath, life threatening emergency, suicidal or homicidal thoughts you must seek medical  attention immediately by calling 911 or calling your MD immediately  if symptoms less severe.  You Must read complete instructions/literature along with all the possible adverse reactions/side effects for all the Medicines you take and that have been prescribed to you. Take any new Medicines after you have completely understood and accpet all the possible adverse reactions/side effects.   Do not drive, operate heavy machinery, perform activities at heights, swimming or participation in water activities or provide baby sitting  services if your were admitted for syncope or siezures until you have seen by Primary MD or a Neurologist and advised to do so again.  Do not drive when taking Pain medications.   Driving Restrictions   Complete by: As directed    Avoiding driving until seen by outpatient PCP and Neurologist.   Increase activity slowly   Complete by: As directed      Allergies as of 04/28/2019      Reactions   Codeine Nausea And Vomiting      Medication List    TAKE these medications   aspirin 81 MG EC tablet Take 1 tablet (81 mg total) by mouth daily.   baclofen 20 MG tablet Commonly known as: LIORESAL Take 40 mg by mouth at bedtime.   carvedilol 12.5 MG tablet Commonly known as: COREG Take 1 tablet (12.5 mg total) by mouth 2 (two) times daily with a meal. Please keep upcoming appt in April with Dr. Ladona Ridgel for future refills. Thank you   ferrous sulfate 325 (65 FE) MG tablet Take 1 tablet (325 mg total) by mouth 2 (two) times daily with a meal.   furosemide 40 MG tablet Commonly known as: LASIX Take 40 mg by mouth daily as needed for fluid.   glimepiride 4 MG tablet Commonly known as: AMARYL Take 4 mg by mouth daily with breakfast.   linagliptin 5 MG Tabs tablet Commonly known as: TRADJENTA Take 5 mg by mouth daily after breakfast.   meclizine 25 MG tablet Commonly known as: ANTIVERT Take 25 mg by mouth 4 (four) times daily as needed. For dizziness   montelukast 10 MG tablet Commonly known as: SINGULAIR Take 10 mg by mouth at bedtime.   oxyCODONE-acetaminophen 5-325 MG tablet Commonly known as: PERCOCET/ROXICET Take a half tablet every 6 hours   rivaroxaban 20 MG Tabs tablet Commonly known as: Xarelto Take 1 tablet (20 mg total) by mouth daily with supper.   SENNA CO Take 1 tablet by mouth daily as needed (constipation).   senna-docusate 8.6-50 MG tablet Commonly known as: Senokot-S Take 2 tablets by mouth daily as needed for mild constipation.   spironolactone 25 MG  tablet Commonly known as: ALDACTONE Take 1 tablet (25 mg total) by mouth every morning.      Follow-up Information    [name], DO. Schedule an appointment as soon as possible for a visit in 1 week(s).   Contact information: [address] [place] [address] [phone]        Beryle Beams, MD. Schedule an appointment as soon as possible for a visit in 3 week(s).   Specialty: Neurology Contact information: [address] [place] [address] [phone]          Allergies  Allergen Reactions  . Codeine Nausea And Vomiting    You were cared for by a hospitalist during your hospital stay. If you have any questions about your discharge medications or the care you received while you were in the hospital after you are  discharged, you can call the unit and asked to speak with the hospitalist on call if the hospitalist that took care of you is not available. Once you are discharged, your primary care physician will handle any further medical issues. Please note that no refills for any discharge medications will be authorized once you are discharged, as it is imperative that you return to your primary care physician (or establish a relationship with a primary care physician if you do not have one) for your aftercare needs so that they can reassess your need for medications and monitor your lab values.   Procedures/Studies: Ct Angio Head W Or Wo Contrast  Result Date: 04/27/2019 CLINICAL DATA:  Vertigo, persistent, central. Dizziness for the past few days. Patient denies any falls. EXAM: CT ANGIOGRAPHY HEAD AND NECK TECHNIQUE: Multidetector CT imaging of the head and neck was performed using the standard protocol during bolus administration of intravenous contrast. Multiplanar CT image reconstructions and MIPs were obtained to evaluate the vascular anatomy. Carotid stenosis measurements (when applicable) are obtained utilizing NASCET criteria, using the distal  internal carotid diameter as the denominator. CONTRAST:  OMNIPAQUE IOHEXOL 350 MG/ML SOLN COMPARISON:  CT head 11/18/2013 FINDINGS: CT HEAD FINDINGS Brain: Noncontrast imaging of the head demonstrates remote lacunar infarcts involving the left thalamus and internal capsule. There is associated volume loss. No acute infarct, hemorrhage, or mass lesion is present. Ex vacuo dilation is present in the left lateral ventricle. No significant extraaxial fluid collection is present. The brainstem and cerebellum are within normal limits. Vascular: Atherosclerotic calcifications are present within the cavernous internal carotid arteries bilaterally. There is no hyperdense vessel. Skull: Calvarium is intact. No focal lytic or blastic lesions are present. Sinuses: Is chronic opacification of the right sphenoid sinus. The paranasal sinuses and mastoid air cells are otherwise clear. Orbits: The globes and orbits are within normal limits. Review of the MIP images confirms the above findings CTA NECK FINDINGS Aortic arch: Atherosclerotic calcifications are present at the aortic arch without aneurysm or significant stenosis involving the great vessel origins. Right carotid system: Additional calcifications are present at the bifurcation of the innominate artery without significant stenosis. There is some tortuosity of the right common carotid artery. Mild atherosclerotic disease is present at the right carotid bifurcation without a significant stenosis relative to the more distal right ICA. The right ICA is otherwise within normal limits. Left carotid system: The left common carotid artery is within normal limits. Noncalcified plaque is present in the posterior aspect of the left carotid bifurcation. There is no significant stenosis relative to the more distal vessel. The cervical left ICA is otherwise normal. Vertebral arteries: The right vertebral artery is the dominant vessel. There is no significant stenosis at its origin  of the right subclavian artery. The left vertebral artery is occluded. A hypoplastic vessel is reconstituted at the level of C2. Skeleton: Degenerative changes are noted in the cervical spine with slight anterolisthesis at C4-5. Chronic endplate changes are present at C5-6 with uncovertebral spurring an osseous foraminal narrowing, left greater than right. The patient is edentulous. No focal lytic or blastic lesions are present. Other neck: Thyroid is mildly heterogeneous without a dominant lesion. No significant adenopathy is present. Salivary glands are within normal limits. No focal mucosal or submucosal lesions are present. Surgical clips are noted in the right lower neck. Upper chest: The lung apices are clear. Thoracic inlet is within normal limits. Pacing wires are noted. Review of the MIP images confirms the above findings  CTA HEAD FINDINGS Anterior circulation: Atherosclerotic calcifications are present within the cavernous internal carotid arteries bilaterally without a significant stenosis relative to the more distal vessel. ICA termini are within normal limits. The A1 and M1 segments are normal. The anterior communicating artery is patent. The MCA bifurcations are intact. ACA and MCA branch vessels are within normal limits. Posterior circulation: The right vertebral artery is the dominant vessel. Is moderate stenosis of the left V4 segment just distal to the PICA origin. Vertebrobasilar junction is intact. The basilar artery is normal. Both posterior cerebral arteries originate from basilar tip. There is asymmetric attenuation of left PCA branch vessels without a significant proximal stenosis or occlusion Venous sinuses: Dural sinuses are patent. The straight sinus deep cerebral veins are intact. Cortical veins are unremarkable. Anatomic variants: None Review of the MIP images confirms the above findings IMPRESSION: 1. The left vertebral artery is occluded at its origin with reconstitution of a  hypoplastic vessel at the C2 level. 2. Moderate stenosis of the residual left vertebral artery just distal to the left PICA. 3. Dominant right vertebral artery without significant stenosis through the basilar tip. 4. Left-sided stenosis and occlusion the vertebral artery may be associated with remote posterior circulation infarcts on the left. This could contribute to ongoing vertebrobasilar symptoms. 5. Atherosclerotic changes at the aortic arch, carotid bifurcations, and cavernous internal carotid arteries without a significant stenosis in the anterior circulation. Aortic Atherosclerosis (ICD10-I70.0). 6. No significant proximal stenosis, aneurysm, or branch vessel occlusion within the anterior circulation of the circle-of-Willis. 7. Multilevel degenerative changes within the cervical spine as described. Electronically Signed   By: San Morelle M.D.   On: 04/27/2019 04:22   Ct Angio Neck W And/or Wo Contrast  Result Date: 04/27/2019 CLINICAL DATA:  Vertigo, persistent, central. Dizziness for the past few days. Patient denies any falls. EXAM: CT ANGIOGRAPHY HEAD AND NECK TECHNIQUE: Multidetector CT imaging of the head and neck was performed using the standard protocol during bolus administration of intravenous contrast. Multiplanar CT image reconstructions and MIPs were obtained to evaluate the vascular anatomy. Carotid stenosis measurements (when applicable) are obtained utilizing NASCET criteria, using the distal internal carotid diameter as the denominator. CONTRAST:  145mL OMNIPAQUE IOHEXOL 350 MG/ML SOLN COMPARISON:  CT head 11/18/2013 FINDINGS: CT HEAD FINDINGS Brain: Noncontrast imaging of the head demonstrates remote lacunar infarcts involving the left thalamus and internal capsule. There is associated volume loss. No acute infarct, hemorrhage, or mass lesion is present. Ex vacuo dilation is present in the left lateral ventricle. No significant extraaxial fluid collection is present. The  brainstem and cerebellum are within normal limits. Vascular: Atherosclerotic calcifications are present within the cavernous internal carotid arteries bilaterally. There is no hyperdense vessel. Skull: Calvarium is intact. No focal lytic or blastic lesions are present. Sinuses: Is chronic opacification of the right sphenoid sinus. The paranasal sinuses and mastoid air cells are otherwise clear. Orbits: The globes and orbits are within normal limits. Review of the MIP images confirms the above findings CTA NECK FINDINGS Aortic arch: Atherosclerotic calcifications are present at the aortic arch without aneurysm or significant stenosis involving the great vessel origins. Right carotid system: Additional calcifications are present at the bifurcation of the innominate artery without significant stenosis. There is some tortuosity of the right common carotid artery. Mild atherosclerotic disease is present at the right carotid bifurcation without a significant stenosis relative to the more distal right ICA. The right ICA is otherwise within normal limits. Left carotid system: The left common  carotid artery is within normal limits. Noncalcified plaque is present in the posterior aspect of the left carotid bifurcation. There is no significant stenosis relative to the more distal vessel. The cervical left ICA is otherwise normal. Vertebral arteries: The right vertebral artery is the dominant vessel. There is no significant stenosis at its origin of the right subclavian artery. The left vertebral artery is occluded. A hypoplastic vessel is reconstituted at the level of C2. Skeleton: Degenerative changes are noted in the cervical spine with slight anterolisthesis at C4-5. Chronic endplate changes are present at C5-6 with uncovertebral spurring an osseous foraminal narrowing, left greater than right. The patient is edentulous. No focal lytic or blastic lesions are present. Other neck: Thyroid is mildly heterogeneous without a  dominant lesion. No significant adenopathy is present. Salivary glands are within normal limits. No focal mucosal or submucosal lesions are present. Surgical clips are noted in the right lower neck. Upper chest: The lung apices are clear. Thoracic inlet is within normal limits. Pacing wires are noted. Review of the MIP images confirms the above findings CTA HEAD FINDINGS Anterior circulation: Atherosclerotic calcifications are present within the cavernous internal carotid arteries bilaterally without a significant stenosis relative to the more distal vessel. ICA termini are within normal limits. The A1 and M1 segments are normal. The anterior communicating artery is patent. The MCA bifurcations are intact. ACA and MCA branch vessels are within normal limits. Posterior circulation: The right vertebral artery is the dominant vessel. Is moderate stenosis of the left V4 segment just distal to the PICA origin. Vertebrobasilar junction is intact. The basilar artery is normal. Both posterior cerebral arteries originate from basilar tip. There is asymmetric attenuation of left PCA branch vessels without a significant proximal stenosis or occlusion Venous sinuses: Dural sinuses are patent. The straight sinus deep cerebral veins are intact. Cortical veins are unremarkable. Anatomic variants: None Review of the MIP images confirms the above findings IMPRESSION: 1. 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 2. Moderate stenosis of the residual left vertebral artery just distal to the left PICA. 3. Dominant right vertebral artery without significant stenosis through the basilar tip. 4. Left-sided stenosis and occlusion the vertebral artery may be associated with remote posterior circulation infarcts on the left. This could contribute to ongoing vertebrobasilar symptoms. 5. Atherosclerotic changes at the aortic arch, carotid bifurcations, and cavernous internal  carotid arteries without a significant stenosis in the anterior circulation. Aortic Atherosclerosis (ICD10-I70.0). 6. No significant proximal stenosis, aneurysm, or branch vessel occlusion within the anterior circulation of the circle-of-Willis. 7. Multilevel degenerative changes within the cervical spine as described. Electronically Signed   By: Marin Roberts M.D.   On: 04/27/2019 04:22      The results of significant diagnostics from this hospitalization (including imaging, microbiology, ancillary and laboratory) are listed below for reference.     Microbiology: Recent Results (from the past 240 hour(s))  SARS Coronavirus 2 (CEPHEID - Performed in [facility] hospital lab), Hosp Order     Status: None   Collection Time: 04/27/19  5:37 AM   Specimen: Nasopharyngeal Swab  Result Value Ref Range Status   SARS Coronavirus 2 NEGATIVE NEGATIVE Final    Comment: (NOTE) If result is NEGATIVE SARS-CoV-2 target nucleic acids are NOT DETECTED. The SARS-CoV-2 RNA is generally detectable in upper and lower  respiratory specimens during the acute phase of infection. The lowest  concentration of SARS-CoV-2 viral copies this assay  can detect is 250  copies / mL. A negative result does not preclude SARS-CoV-2 infection  and should not be used as the sole basis for treatment or other  patient management decisions.  A negative result may occur with  improper specimen collection / handling, submission of specimen other  than nasopharyngeal swab, presence of viral mutation(s) within the  areas targeted by this assay, and inadequate number of viral copies  (<250 copies / mL). A negative result must be combined with clinical  observations, patient history, and epidemiological information. If result is POSITIVE SARS-CoV-2 target nucleic acids are DETECTED. The SARS-CoV-2 RNA is generally detectable in upper and lower  respiratory specimens dur ing the acute phase of infection.  Positive  results  are indicative of active infection with SARS-CoV-2.  Clinical  correlation with patient history and other diagnostic information is  necessary to determine patient infection status.  Positive results do  not rule out bacterial infection or co-infection with other viruses. If result is PRESUMPTIVE POSTIVE SARS-CoV-2 nucleic acids MAY BE PRESENT.   A presumptive positive result was obtained on the submitted specimen  and confirmed on repeat testing.  While 2019 novel coronavirus  (SARS-CoV-2) nucleic acids may be present in the submitted sample  additional confirmatory testing may be necessary for epidemiological  and / or clinical management purposes  to differentiate between  SARS-CoV-2 and other Sarbecovirus currently known to infect humans.  If clinically indicated additional testing with an alternate test  methodology [phone]([id number]) is advised. The SARS-CoV-2 RNA is generally  detectable in upper and lower respiratory sp ecimens during the acute  phase of infection. The expected result is Negative. Fact Sheet for Patients:  BoilerBrush.com.cyhttps://www.fda.gov/media/136312/download Fact Sheet for Healthcare Providers: https://pope.com/https://www.fda.gov/media/136313/download This test is not yet approved or cleared by the [place] FDA and has been authorized for detection and/or diagnosis of SARS-CoV-2 by FDA under an Emergency Use Authorization (EUA).  This EUA will remain in effect (meaning this test can be used) for the duration of the COVID-19 declaration under Section 564(b)(1) of the Act, 21 U.S.C. section 360bbb-3(b)(1), unless the authorization is terminated or revoked sooner. Performed at [facility], [address] [place], [place] [id number]      Labs: BNP (last 3 results) No results for input(s): BNP in the last 8760 hours. Basic Metabolic Panel: Recent Labs  Lab 04/27/19 0153 04/28/19 0602  NA 138 142  K 3.7 4.0  CL 104 107  CO2 26 28  GLUCOSE 269* 89  BUN 15 13  CREATININE 0.99 0.88   CALCIUM 8.8* 8.6*  MG  --  2.1   Liver Function Tests: Recent Labs  Lab 04/27/19 0153  AST 15  ALT 11  ALKPHOS 98  BILITOT 0.5  PROT 6.9  ALBUMIN 4.2   No results for input(s): LIPASE, AMYLASE in the last 168 hours. No results for input(s): AMMONIA in the last 168 hours. CBC: Recent Labs  Lab 04/27/19 0153 04/28/19 0602  WBC 6.8 6.7  NEUTROABS 4.6  --   HGB 14.3 13.8  HCT 44.0 44.1  MCV 90.7 92.6  PLT 192 189   Cardiac Enzymes: Recent Labs  Lab 04/27/19 0153  CKTOTAL 82   BNP: Invalid input(s): POCBNP CBG: Recent Labs  Lab 04/27/19 0838 04/27/19 1201 04/27/19 1614 04/27/19 2117 04/28/19 0742  GLUCAP 137* 138* 190* 87 86   D-Dimer No results for input(s): DDIMER in the last 72 hours. Hgb A1c Recent Labs    04/27/19 0153 04/27/19 [id number]  HGBA1C 7.8* 8.0*   Lipid Profile Recent Labs    04/27/19 0153  CHOL 116  HDL 35*  LDLCALC 67  TRIG 69  CHOLHDL 3.3   Thyroid function studies Recent Labs    04/27/19 0153  TSH 5.923*   Anemia work up Recent Labs    04/27/19 0153  VITAMINB12 357   Urinalysis    Component Value Date/Time   COLORURINE STRAW (A) 03/12/2018 1906   APPEARANCEUR CLEAR 03/12/2018 1906   LABSPEC 1.006 03/12/2018 1906   PHURINE 7.0 03/12/2018 1906   GLUCOSEU NEGATIVE 03/12/2018 1906   HGBUR NEGATIVE 03/12/2018 1906   BILIRUBINUR NEGATIVE 03/12/2018 1906   KETONESUR NEGATIVE 03/12/2018 1906   PROTEINUR NEGATIVE 03/12/2018 1906   UROBILINOGEN 0.2 08/09/2013 1012   NITRITE NEGATIVE 03/12/2018 1906   LEUKOCYTESUR NEGATIVE 03/12/2018 1906   Sepsis Labs Invalid input(s): PROCALCITONIN,  WBC,  LACTICIDVEN Microbiology Recent Results (from the past 240 hour(s))  SARS Coronavirus 2 (CEPHEID - Performed in [facility] lab), Hosp Order     Status: None   Collection Time: 04/27/19  5:37 AM   Specimen: Nasopharyngeal Swab  Result Value Ref Range Status   SARS Coronavirus 2 NEGATIVE NEGATIVE Final    Comment:  (NOTE) If result is NEGATIVE SARS-CoV-2 target nucleic acids are NOT DETECTED. The SARS-CoV-2 RNA is generally detectable in upper and lower  respiratory specimens during the acute phase of infection. The lowest  concentration of SARS-CoV-2 viral copies this assay can detect is 250  copies / mL. A negative result does not preclude SARS-CoV-2 infection  and should not be used as the sole basis for treatment or other  patient management decisions.  A negative result may occur with  improper specimen collection / handling, submission of specimen other  than nasopharyngeal swab, presence of viral mutation(s) within the  areas targeted by this assay, and inadequate number of viral copies  (<250 copies / mL). A negative result must be combined with clinical  observations, patient history, and epidemiological information. If result is POSITIVE SARS-CoV-2 target nucleic acids are DETECTED. The SARS-CoV-2 RNA is generally detectable in upper and lower  respiratory specimens dur ing the acute phase of infection.  Positive  results are indicative of active infection with SARS-CoV-2.  Clinical  correlation with patient history and other diagnostic information is  necessary to determine patient infection status.  Positive results do  not rule out bacterial infection or co-infection with other viruses. If result is PRESUMPTIVE POSTIVE SARS-CoV-2 nucleic acids MAY BE PRESENT.   A presumptive positive result was obtained on the submitted specimen  and confirmed on repeat testing.  While 2019 novel coronavirus  (SARS-CoV-2) nucleic acids may be present in the submitted sample  additional confirmatory testing may be necessary for epidemiological  and / or clinical management purposes  to differentiate between  SARS-CoV-2 and other Sarbecovirus currently known to infect humans.  If clinically indicated additional testing with an alternate test  methodology [phone]) is advised. The SARS-CoV-2 RNA is  generally  detectable in upper and lower respiratory sp ecimens during the acute  phase of infection. The expected result is Negative. Fact Sheet for Patients:  BoilerBrush.com.cyhttps://www.fda.gov/media/136312/download Fact Sheet for Healthcare Providers: https://pope.com/https://www.fda.gov/media/136313/download This test is not yet approved or cleared by the [place] FDA and has been authorized for detection and/or diagnosis of SARS-CoV-2 by FDA under an Emergency Use Authorization (EUA).  This EUA will remain in effect (meaning this test can be used) for the duration of the COVID-19  declaration under Section 564(b)(1) of the Act, 21 U.S.C. section 360bbb-3(b)(1), unless the authorization is terminated or revoked sooner. Performed at [facility], [address], [place], [place] [address]      Time coordinating discharge:  I have spent 35 minutes face to face with the patient and on the ward discussing the patients care, assessment, plan and disposition with other care givers. >50% of the time was devoted counseling the patient about the risks and benefits of treatment/Discharge disposition and coordinating care.   SIGNED:   Dimple Nanas, MD  [facility] 04/28/2019, 10:44 AM   If 7PM-7AM, please contact night-coverage [email or web address]

## 2019-05-02 ENCOUNTER — Other Ambulatory Visit: Payer: Self-pay

## 2019-05-02 ENCOUNTER — Inpatient Hospital Stay: Payer: Medicare HMO | Attending: Oncology

## 2019-05-02 VITALS — BP 122/68 | HR 70 | Temp 98.2°F | Resp 18

## 2019-05-02 DIAGNOSIS — D509 Iron deficiency anemia, unspecified: Secondary | ICD-10-CM

## 2019-05-02 DIAGNOSIS — E538 Deficiency of other specified B group vitamins: Secondary | ICD-10-CM | POA: Insufficient documentation

## 2019-05-02 LAB — FOLATE RBC
Folate, Hemolysate: 436 ng/mL
Folate, RBC: 1118 ng/mL (ref >498)
Hematocrit: 39 % (ref 37.5–51.0)

## 2019-05-02 MED ORDER — CYANOCOBALAMIN 1000 MCG/ML IJ SOLN
INTRAMUSCULAR | Status: AC
  Filled 2019-05-02: qty 1

## 2019-05-02 MED ORDER — CYANOCOBALAMIN 1000 MCG/ML IJ SOLN
1000.0000 ug | Freq: Once | INTRAMUSCULAR | Status: AC
  Administered 2019-05-02: 08:00:00 1000 ug via INTRAMUSCULAR

## 2019-05-02 NOTE — Patient Instructions (Signed)
Cyanocobalamin, Vitamin B12 injection What is this medicine? CYANOCOBALAMIN (sye an oh koe BAL a min) is a man made form of vitamin B12. Vitamin B12 is used in the growth of healthy blood cells, nerve cells, and proteins in the body. It also helps with the metabolism of fats and carbohydrates. This medicine is used to treat people who can not absorb vitamin B12. This medicine may be used for other purposes; ask your health care provider or pharmacist if you have questions. COMMON BRAND NAME(S): B-12 Compliance Kit, B-12 Injection Kit, Cyomin, LA-12, Nutri-Twelve, Physicians EZ Use B-12, Primabalt What should I tell my health care provider before I take this medicine? They need to know if you have any of these conditions: -kidney disease -Leber's disease -megaloblastic anemia -an unusual or allergic reaction to cyanocobalamin, cobalt, other medicines, foods, dyes, or preservatives -pregnant or trying to get pregnant -breast-feeding How should I use this medicine? This medicine is injected into a muscle or deeply under the skin. It is usually given by a health care professional in a clinic or doctor's office. However, your doctor may teach you how to inject yourself. Follow all instructions. Talk to your pediatrician regarding the use of this medicine in children. Special care may be needed. Overdosage: If you think you have taken too much of this medicine contact a poison control center or emergency room at once. NOTE: This medicine is only for you. Do not share this medicine with others. What if I miss a dose? If you are given your dose at a clinic or doctor's office, call to reschedule your appointment. If you give your own injections and you miss a dose, take it as soon as you can. If it is almost time for your next dose, take only that dose. Do not take double or extra doses. What may interact with this medicine? -colchicine -heavy alcohol intake This list may not describe all possible  interactions. Give your health care provider a list of all the medicines, herbs, non-prescription drugs, or dietary supplements you use. Also tell them if you smoke, drink alcohol, or use illegal drugs. Some items may interact with your medicine. What should I watch for while using this medicine? Visit your doctor or health care professional regularly. You may need blood work done while you are taking this medicine. You may need to follow a special diet. Talk to your doctor. Limit your alcohol intake and avoid smoking to get the best benefit. What side effects may I notice from receiving this medicine? Side effects that you should report to your doctor or health care professional as soon as possible: -allergic reactions like skin rash, itching or hives, swelling of the face, lips, or tongue -blue tint to skin -chest tightness, pain -difficulty breathing, wheezing -dizziness -red, swollen painful area on the leg Side effects that usually do not require medical attention (report to your doctor or health care professional if they continue or are bothersome): -diarrhea -headache This list may not describe all possible side effects. Call your doctor for medical advice about side effects. You may report side effects to FDA at 1-800-FDA-1088. Where should I keep my medicine? Keep out of the reach of children. Store at room temperature between 15 and 30 degrees C (59 and 85 degrees F). Protect from light. Throw away any unused medicine after the expiration date. NOTE: This sheet is a summary. It may not cover all possible information. If you have questions about this medicine, talk to your doctor, pharmacist, or   health care provider.  2019 Elsevier/Gold Standard (2007-12-27 22:10:20)  

## 2019-05-13 ENCOUNTER — Other Ambulatory Visit: Payer: Self-pay | Admitting: Internal Medicine

## 2019-05-13 ENCOUNTER — Telehealth: Payer: Self-pay | Admitting: Internal Medicine

## 2019-05-13 MED ORDER — CARVEDILOL 12.5 MG PO TABS: 12.5000 mg | ORAL_TABLET | Freq: Two times a day (BID) | ORAL | 2 refills | Status: DC

## 2019-05-13 NOTE — Telephone Encounter (Signed)
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 [name] wants to know if patient can be placed  On a statin medication. Please advise.

## 2019-05-13 NOTE — Telephone Encounter (Signed)
Called and spoke with patient to see why his lipitor was stopped. Patient was not sure, said I recokon bc he didn't think I needed it. Patient's LDL off statin therapy was 67, however I certainly think that the cardiovascular/cerebrovascualr benefit that statin would provide is needed. There has been no evidence that very low LDL's (in the 20's) is harmful.  I have called and left a message at neurologist office that we do advise patient start statin therapy and that the patient is expecting a call from them for further instruction.

## 2019-05-15 NOTE — Telephone Encounter (Signed)
Agree. With cerebral artery disease, agree with continuing statin therapy.

## 2019-05-17 NOTE — Telephone Encounter (Signed)
Spoke with [name] and provided her the info below. She will reach out to patient to get him restarted on statin therapy

## 2019-05-20 ENCOUNTER — Telehealth: Payer: Self-pay | Admitting: Internal Medicine

## 2019-05-20 NOTE — Telephone Encounter (Signed)
  Patient is calling because he had discussed with someone about going on Lipitor and he is wondering if it was Dr [name]. If so, he would like to discuss.

## 2019-05-20 NOTE — Telephone Encounter (Signed)
SPOKE WITH PT AND INSTRUCTED PT TO CALL NEUROLOGISTS OFFICE FOR SCRIPT ON CHOLESTEROL MED SEE PREVIOUS NOTE./CY

## 2019-06-02 ENCOUNTER — Inpatient Hospital Stay: Payer: Medicare HMO | Attending: Oncology

## 2019-06-02 ENCOUNTER — Other Ambulatory Visit: Payer: Self-pay

## 2019-06-02 VITALS — BP 118/70 | HR 72 | Temp 98.2°F | Resp 18

## 2019-06-02 DIAGNOSIS — D509 Iron deficiency anemia, unspecified: Secondary | ICD-10-CM

## 2019-06-02 DIAGNOSIS — E538 Deficiency of other specified B group vitamins: Secondary | ICD-10-CM | POA: Diagnosis present

## 2019-06-02 MED ORDER — CYANOCOBALAMIN 1000 MCG/ML IJ SOLN
1000.0000 ug | Freq: Once | INTRAMUSCULAR | Status: AC
  Administered 2019-06-02: 1000 ug via INTRAMUSCULAR

## 2019-07-01 ENCOUNTER — Inpatient Hospital Stay (HOSPITAL_BASED_OUTPATIENT_CLINIC_OR_DEPARTMENT_OTHER): Payer: Medicare HMO | Admitting: Oncology

## 2019-07-01 ENCOUNTER — Inpatient Hospital Stay: Payer: Medicare HMO | Attending: Oncology

## 2019-07-01 ENCOUNTER — Inpatient Hospital Stay: Payer: Medicare HMO

## 2019-07-01 ENCOUNTER — Other Ambulatory Visit: Payer: Self-pay

## 2019-07-01 VITALS — BP 127/87 | HR 89 | Temp 98.3°F | Resp 18 | Ht 73.0 in | Wt 205.6 lb

## 2019-07-01 DIAGNOSIS — Z7901 Long term (current) use of anticoagulants: Secondary | ICD-10-CM | POA: Diagnosis not present

## 2019-07-01 DIAGNOSIS — D509 Iron deficiency anemia, unspecified: Secondary | ICD-10-CM | POA: Diagnosis present

## 2019-07-01 DIAGNOSIS — Z79899 Other long term (current) drug therapy: Secondary | ICD-10-CM | POA: Diagnosis not present

## 2019-07-01 DIAGNOSIS — E538 Deficiency of other specified B group vitamins: Secondary | ICD-10-CM

## 2019-07-01 DIAGNOSIS — E119 Type 2 diabetes mellitus without complications: Secondary | ICD-10-CM | POA: Insufficient documentation

## 2019-07-01 DIAGNOSIS — Z7984 Long term (current) use of oral hypoglycemic drugs: Secondary | ICD-10-CM | POA: Insufficient documentation

## 2019-07-01 LAB — CBC WITH DIFFERENTIAL (CANCER CENTER ONLY)
Abs Immature Granulocytes: 0.02 10*3/uL (ref 0.00–0.07)
Basophils Absolute: 0 10*3/uL (ref 0.0–0.1)
Basophils Relative: 0 %
Eosinophils Absolute: 0.2 10*3/uL (ref 0.0–0.5)
Eosinophils Relative: 2 %
HCT: 44.1 % (ref 39.0–52.0)
Hemoglobin: 14.4 g/dL (ref 13.0–17.0)
Immature Granulocytes: 0 %
Lymphocytes Relative: 21 %
Lymphs Abs: 1.7 10*3/uL (ref 0.7–4.0)
MCH: 30 pg (ref 26.0–34.0)
MCHC: 32.7 g/dL (ref 30.0–36.0)
MCV: 91.9 fL (ref 80.0–100.0)
Monocytes Absolute: 0.9 10*3/uL (ref 0.1–1.0)
Monocytes Relative: 11 %
Neutro Abs: 5 10*3/uL (ref 1.7–7.7)
Neutrophils Relative %: 66 %
Platelet Count: 191 10*3/uL (ref 150–400)
RBC: 4.8 MIL/uL (ref 4.22–5.81)
RDW: 14.2 % (ref 11.5–15.5)
WBC Count: 7.8 10*3/uL (ref 4.0–10.5)
nRBC: 0 % (ref 0.0–0.2)

## 2019-07-01 LAB — IRON AND TIBC
Iron: 45 ug/dL (ref 42–163)
Saturation Ratios: 12 % — ABNORMAL LOW (ref 20–55)
TIBC: 365 ug/dL (ref 202–409)
UIBC: 320 ug/dL (ref 117–376)

## 2019-07-01 LAB — FERRITIN: Ferritin: 58 ng/mL (ref 24–336)

## 2019-07-01 MED ORDER — CYANOCOBALAMIN 1000 MCG/ML IJ SOLN
1000.0000 ug | Freq: Once | INTRAMUSCULAR | Status: AC
  Administered 2019-07-01: 1000 ug via INTRAMUSCULAR

## 2019-07-01 NOTE — Progress Notes (Signed)
[facility] OFF[facility] PROGRESS NOTE 07/01/19   [name] P, DO 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  He has had poor absorption of both B12 and iron.  This was diagnosed since 2008.    CURRENT THERAPY:   B-12 1000 mcg IM injection on a monthly basis.  Feraheme infusion as needed.  He has not required it since October 2019.  INTERVAL HISTORY:  [name] is here for return evaluation.  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  Since his discharge, he reports feeling reasonably well without any recent complaints.  He continues to tolerate B12 injections without any complications.  His energy and performance status remains stable.  He denies any sense of fatigue or tiredness.  He denies any hematochezia or melena.  He denied headaches, blurry vision, syncope or seizures.  Denies any fevers, chills or sweats.  Denied chest pain, palpitation, orthopnea or leg edema.  Denied cough, wheezing or hemoptysis.  Denied nausea, vomiting or abdominal pain.  Denies any constipation or diarrhea.  Denies any frequency urgency or hesitancy.  Denies any arthralgias or myalgias.  Denies any skin rashes or lesions.  Denies any bleeding or clotting tendency.  Denies any easy bruising.  Denies any hair or nail changes.  Denies any anxiety or depression.  Remaining review of system is negative.      ALLERGIES:  is allergic to codeine.  MEDICATIONS: Updated today on review.  Current Outpatient Medications  Medication Sig Dispense Refill  . baclofen (LIORESAL) 20 MG tablet Take 40 mg by mouth at bedtime.     . carvedilol (COREG) 12.5 MG tablet Take 1 tablet (12.5 mg total) by mouth 2 (two) times daily with a meal. 180 tablet 2  . ferrous sulfate 325 (65 FE)  MG tablet Take 1 tablet (325 mg total) by mouth 2 (two) times daily with a meal. 120 tablet 0  . furosemide (LASIX) 40 MG tablet Take 40 mg by mouth daily as needed for fluid.     [name] glimepiride (AMARYL) 4 MG tablet Take 4 mg by mouth daily with breakfast.    . linagliptin (TRADJENTA) 5 MG TABS tablet Take 5 mg by mouth daily after breakfast.     . meclizine (ANTIVERT) 25 MG tablet Take 25 mg by mouth 4 (four) times daily as needed. For dizziness    . montelukast (SINGULAIR) 10 MG tablet Take 10 mg by mouth at bedtime.     [name] oxyCODONE-acetaminophen (PERCOCET/ROXICET) 5-325 MG tablet Take a half tablet every 6 hours    . rivaroxaban (XARELTO) 20 MG TABS tablet Take 1 tablet (20 mg total) by mouth daily with supper. 30 tablet 11  . SENNA CO Take 1 tablet by mouth daily as needed (constipation).     [name] spironolactone (ALDACTONE) 25 MG tablet Take 1 tablet (25 mg total) by mouth every morning. 90 tablet 3   No current facility-administered medications for this visit.      PHYSICAL EXAMINATION:   ECOG PERFORMANCE STATUS: 1   General appearance: Comfortable appearing without any discomfort Head: Normocephalic without any trauma Oropharynx: Mucous membranes are moist and pink without any thrush or ulcers. Eyes: Pupils are equal and round reactive to light. Lymph nodes: No cervical, supraclavicular, inguinal or axillary lymphadenopathy.   Heart:regular rate and  rhythm.  S1 and S2 without leg edema. Lung: Clear without any rhonchi or wheezes.  No dullness to percussion. Abdomin: Soft, nontender, nondistended with good bowel sounds.  No hepatosplenomegaly. Musculoskeletal: No joint deformity or effusion.  Full range of motion noted. Neurological: No deficits noted on motor, sensory and deep tendon reflex exam. Skin: No petechial rash or dryness.  Appeared moist.       Labs:  Lab Results  Component Value Date   WBC 6.7 04/28/2019   HGB 13.8 04/28/2019   HCT 44.1 04/28/2019   MCV 92.6  04/28/2019   PLT 189 04/28/2019   NEUTROABS 4.6 04/27/2019      Chemistry      Component Value Date/Time   NA 142 04/28/2019 0602   NA 141 06/02/2017 0952   K 4.0 04/28/2019 0602   K 4.0 06/02/2017 0952   CL 107 04/28/2019 0602   CL 100 01/06/2013 0830   CO2 28 04/28/2019 0602   CO2 28 06/02/2017 0952   BUN 13 04/28/2019 0602   BUN 15.7 06/02/2017 0952   CREATININE 0.88 04/28/2019 0602   CREATININE 0.9 06/02/2017 0952      Component Value Date/Time   CALCIUM 8.6 (L) 04/28/2019 0602   CALCIUM 9.4 06/02/2017 0952   ALKPHOS 98 04/27/2019 0153   ALKPHOS 81 06/02/2017 0952   AST 15 04/27/2019 0153   AST 13 06/02/2017 0952   ALT 11 04/27/2019 0153   ALT 9 06/02/2017 0952   BILITOT 0.5 04/27/2019 0153   BILITOT 0.46 06/02/2017 0952        Assessment and plan:    73 year old man with:   1.  Multifactorial anemia diagnosed in 2008.  This is related to B-12 deficiency predominantly related to poor iron absorption.  He continues to receive B12 injection on a monthly basis without any complications.  CBC on April 28, 2019 showed a normal hemoglobin at time with a hemoglobin of 14.3.  His vitamin B12 was 357.  I recommended continuing B12 injections at this time.  He is agreeable to proceed as planned with monthly injections.   2. Iron deficiency dating back to 2008.  Iron studies in July 2020 showed iron level of 61 and ferritin of 54.  Risks and benefits of repeat intravenous iron was discussed.  His last Feraheme was in October 2019 and iron studies remain relatively stable.  3. Follow-up: He will continue monthly injections of B12 and return for MD evaluation 6 months.  15  minutes was spent with the patient face-to-face today.  More than 50% of time was spent on reviewing his laboratory data, disease status update and management options for the future.  [name] Hose MD 07/01/19

## 2019-07-04 ENCOUNTER — Ambulatory Visit (INDEPENDENT_AMBULATORY_CARE_PROVIDER_SITE_OTHER): Payer: Medicare HMO | Admitting: *Deleted

## 2019-07-04 DIAGNOSIS — I509 Heart failure, unspecified: Secondary | ICD-10-CM

## 2019-07-04 DIAGNOSIS — I639 Cerebral infarction, unspecified: Secondary | ICD-10-CM

## 2019-07-06 ENCOUNTER — Telehealth: Payer: Self-pay | Admitting: Oncology

## 2019-07-06 LAB — CUP PACEART REMOTE DEVICE CHECK
Battery Remaining Longevity: 50 mo
Battery Remaining Percentage: 59 %
Battery Voltage: 2.93 V
Brady Statistic AP VP Percent: 1 %
Brady Statistic AP VS Percent: 76 %
Brady Statistic AS VP Percent: 1 %
Brady Statistic AS VS Percent: 23 %
Brady Statistic RA Percent Paced: 46 %
Brady Statistic RV Percent Paced: 20 %
Date Time Interrogation Session: 20201007130429
HighPow Impedance: 66 Ohm
HighPow Impedance: 66 Ohm
Implantable Lead Implant Date: 20090708
Implantable Lead Implant Date: 20090708
Implantable Lead Location: 753859
Implantable Lead Location: 753860
Implantable Lead Model: 7122
Implantable Pulse Generator Implant Date: 20170106
Lead Channel Impedance Value: 340 Ohm
Lead Channel Impedance Value: 360 Ohm
Lead Channel Pacing Threshold Amplitude: 0.5 V
Lead Channel Pacing Threshold Amplitude: 1 V
Lead Channel Pacing Threshold Pulse Width: 0.5 ms
Lead Channel Pacing Threshold Pulse Width: 0.9 ms
Lead Channel Sensing Intrinsic Amplitude: 12 mV
Lead Channel Sensing Intrinsic Amplitude: 2.3 mV
Lead Channel Setting Pacing Amplitude: 2 V
Lead Channel Setting Pacing Amplitude: 2.5 V
Lead Channel Setting Pacing Pulse Width: 0.9 ms
Lead Channel Setting Sensing Sensitivity: 0.5 mV
Pulse Gen Serial Number: 7306179

## 2019-07-06 NOTE — Telephone Encounter (Signed)
Scheduled per sch msg. Called and left msg. Mailed printout  

## 2019-07-11 NOTE — Progress Notes (Signed)
Remote ICD transmission.   

## 2019-08-01 ENCOUNTER — Inpatient Hospital Stay: Payer: Medicare HMO | Attending: Oncology

## 2019-08-01 DIAGNOSIS — E538 Deficiency of other specified B group vitamins: Secondary | ICD-10-CM | POA: Insufficient documentation

## 2019-08-02 ENCOUNTER — Telehealth: Payer: Self-pay | Admitting: Oncology

## 2019-08-02 ENCOUNTER — Other Ambulatory Visit: Payer: Self-pay

## 2019-08-02 ENCOUNTER — Inpatient Hospital Stay: Payer: Medicare HMO

## 2019-08-02 VITALS — BP 122/83 | HR 64 | Temp 98.0°F | Resp 16

## 2019-08-02 DIAGNOSIS — E538 Deficiency of other specified B group vitamins: Secondary | ICD-10-CM | POA: Diagnosis not present

## 2019-08-02 DIAGNOSIS — D509 Iron deficiency anemia, unspecified: Secondary | ICD-10-CM

## 2019-08-02 MED ORDER — CYANOCOBALAMIN 1000 MCG/ML IJ SOLN
1000.0000 ug | Freq: Once | INTRAMUSCULAR | Status: AC
  Administered 2019-08-02: 10:00:00 1000 ug via INTRAMUSCULAR

## 2019-08-02 MED ORDER — CYANOCOBALAMIN 1000 MCG/ML IJ SOLN
INTRAMUSCULAR | Status: AC
  Filled 2019-08-02: qty 1

## 2019-08-02 NOTE — Telephone Encounter (Signed)
Patient called to reschedule 11/02 missed appointment, per patient's request appointment has been rescheduled.

## 2019-08-02 NOTE — Patient Instructions (Signed)
Cyanocobalamin, Pyridoxine, and Folate What is this medicine? A multivitamin containing folic acid, vitamin B6, and vitamin B12. This medicine may be used for other purposes; ask your health care provider or pharmacist if you have questions. COMMON BRAND NAME(S): AllanFol RX, AllanTex, Av-Vite FB, B Complex with Folic Acid, ComBgen, FaBB, Folamin, Folastin, Folbalin, Folbee, Folbic, Folcaps, Folgard, Folgard RX, Folgard RX 2.2, Folplex, Folplex 2.2, Foltabs 800, Foltx, Homocysteine Formula, Niva-Fol, NuFol, TL Gard RX, Virt-Gard, Virt-Vite, Virt-Vite Forte, Vita-Respa What should I tell my health care provider before I take this medicine? They need to know if you have any of these conditions:  bleeding or clotting disorder  history of anemia of any type  other chronic health condition  an unusual or allergic reaction to vitamins, other medicines, foods, dyes, or preservatives  pregnant or trying to get pregnant  breast-feeding How should I use this medicine? Take by mouth with a glass of water. May take with food. Follow the directions on the prescription label. It is usually given once a day. Do not take your medicine more often than directed. Contact your pediatrician regarding the use of this medicine in children. Special care may be needed. Overdosage: If you think you have taken too much of this medicine contact a poison control center or emergency room at once. NOTE: This medicine is only for you. Do not share this medicine with others. What if I miss a dose? If you miss a dose, take it as soon as you can. If it is almost time for your next dose, take only that dose. Do not take double or extra doses. What may interact with this medicine?  levodopa This list may not describe all possible interactions. Give your health care provider a list of all the medicines, herbs, non-prescription drugs, or dietary supplements you use. Also tell them if you smoke, drink alcohol, or use illegal  drugs. Some items may interact with your medicine. What should I watch for while using this medicine? See your health care professional for regular checks on your progress. Remember that vitamin supplements do not replace the need for good nutrition from a balanced diet. What side effects may I notice from receiving this medicine? Side effects that you should report to your doctor or health care professional as soon as possible:  allergic reaction such as skin rash or difficulty breathing  vomiting Side effects that usually do not require medical attention (report to your doctor or health care professional if they continue or are bothersome):  nausea  stomach upset This list may not describe all possible side effects. Call your doctor for medical advice about side effects. You may report side effects to FDA at 1-800-FDA-1088. Where should I keep my medicine? Keep out of the reach of children. Most vitamins should be stored at controlled room temperature. Check your specific product directions. Protect from heat and moisture. Throw away any unused medicine after the expiration date. NOTE: This sheet is a summary. It may not cover all possible information. If you have questions about this medicine, talk to your doctor, pharmacist, or health care provider.  2020 Elsevier/Gold Standard (2007-11-06 00:59:55)  

## 2019-08-31 ENCOUNTER — Inpatient Hospital Stay: Payer: Medicare HMO

## 2019-08-31 ENCOUNTER — Inpatient Hospital Stay: Payer: Medicare HMO | Attending: Oncology

## 2019-08-31 ENCOUNTER — Telehealth: Payer: Self-pay | Admitting: Oncology

## 2019-08-31 ENCOUNTER — Other Ambulatory Visit: Payer: Self-pay

## 2019-08-31 VITALS — BP 128/75 | HR 92 | Temp 97.8°F | Resp 20

## 2019-08-31 DIAGNOSIS — E538 Deficiency of other specified B group vitamins: Secondary | ICD-10-CM | POA: Diagnosis present

## 2019-08-31 DIAGNOSIS — D509 Iron deficiency anemia, unspecified: Secondary | ICD-10-CM

## 2019-08-31 MED ORDER — CYANOCOBALAMIN 1000 MCG/ML IJ SOLN
1000.0000 ug | Freq: Once | INTRAMUSCULAR | Status: AC
  Administered 2019-08-31: 1000 ug via INTRAMUSCULAR

## 2019-08-31 NOTE — Patient Instructions (Signed)
Cyanocobalamin, Pyridoxine, and Folate What is this medicine? A multivitamin containing folic acid, vitamin B6, and vitamin B12. This medicine may be used for other purposes; ask your health care provider or pharmacist if you have questions. COMMON BRAND NAME(S): AllanFol RX, AllanTex, Av-Vite FB, B Complex with Folic Acid, ComBgen, FaBB, Folamin, Folastin, Folbalin, Folbee, Folbic, Folcaps, Folgard, Folgard RX, Folgard RX 2.2, Folplex, Folplex 2.2, Foltabs 800, Foltx, Homocysteine Formula, Niva-Fol, NuFol, TL Gard RX, Virt-Gard, Virt-Vite, Virt-Vite Forte, Vita-Respa What should I tell my health care provider before I take this medicine? They need to know if you have any of these conditions:  bleeding or clotting disorder  history of anemia of any type  other chronic health condition  an unusual or allergic reaction to vitamins, other medicines, foods, dyes, or preservatives  pregnant or trying to get pregnant  breast-feeding How should I use this medicine? Take by mouth with a glass of water. May take with food. Follow the directions on the prescription label. It is usually given once a day. Do not take your medicine more often than directed. Contact your pediatrician regarding the use of this medicine in children. Special care may be needed. Overdosage: If you think you have taken too much of this medicine contact a poison control center or emergency room at once. NOTE: This medicine is only for you. Do not share this medicine with others. What if I miss a dose? If you miss a dose, take it as soon as you can. If it is almost time for your next dose, take only that dose. Do not take double or extra doses. What may interact with this medicine?  levodopa This list may not describe all possible interactions. Give your health care provider a list of all the medicines, herbs, non-prescription drugs, or dietary supplements you use. Also tell them if you smoke, drink alcohol, or use illegal  drugs. Some items may interact with your medicine. What should I watch for while using this medicine? See your health care professional for regular checks on your progress. Remember that vitamin supplements do not replace the need for good nutrition from a balanced diet. What side effects may I notice from receiving this medicine? Side effects that you should report to your doctor or health care professional as soon as possible:  allergic reaction such as skin rash or difficulty breathing  vomiting Side effects that usually do not require medical attention (report to your doctor or health care professional if they continue or are bothersome):  nausea  stomach upset This list may not describe all possible side effects. Call your doctor for medical advice about side effects. You may report side effects to FDA at 1-800-FDA-1088. Where should I keep my medicine? Keep out of the reach of children. Most vitamins should be stored at controlled room temperature. Check your specific product directions. Protect from heat and moisture. Throw away any unused medicine after the expiration date. NOTE: This sheet is a summary. It may not cover all possible information. If you have questions about this medicine, talk to your doctor, pharmacist, or health care provider.  2020 Elsevier/Gold Standard (2007-11-06 00:59:55)  

## 2019-08-31 NOTE — Telephone Encounter (Signed)
Returned patient's phone call regarding rescheduling 12/02 missed appointment, per patient's request appointment time has been rescheduled.

## 2019-09-16 ENCOUNTER — Other Ambulatory Visit: Payer: Self-pay

## 2019-09-16 ENCOUNTER — Ambulatory Visit: Payer: Medicare HMO | Attending: Internal Medicine

## 2019-09-16 DIAGNOSIS — Z20822 Contact with and (suspected) exposure to covid-19: Secondary | ICD-10-CM

## 2019-09-17 LAB — NOVEL CORONAVIRUS, NAA: SARS-CoV-2, NAA: NOT DETECTED

## 2019-09-19 ENCOUNTER — Telehealth: Payer: Self-pay | Admitting: *Deleted

## 2019-09-19 NOTE — Telephone Encounter (Signed)
Patient calling for COVID results- notified negative. Encouraged continued safe practices.

## 2019-10-03 ENCOUNTER — Ambulatory Visit (INDEPENDENT_AMBULATORY_CARE_PROVIDER_SITE_OTHER): Payer: Medicare HMO | Admitting: *Deleted

## 2019-10-03 ENCOUNTER — Other Ambulatory Visit: Payer: Self-pay

## 2019-10-03 ENCOUNTER — Inpatient Hospital Stay: Payer: Medicare HMO | Attending: Oncology

## 2019-10-03 VITALS — BP 122/78 | HR 88 | Temp 98.2°F | Resp 18

## 2019-10-03 DIAGNOSIS — E538 Deficiency of other specified B group vitamins: Secondary | ICD-10-CM | POA: Diagnosis present

## 2019-10-03 DIAGNOSIS — I509 Heart failure, unspecified: Secondary | ICD-10-CM | POA: Diagnosis not present

## 2019-10-03 DIAGNOSIS — D509 Iron deficiency anemia, unspecified: Secondary | ICD-10-CM

## 2019-10-03 LAB — CUP PACEART REMOTE DEVICE CHECK
Battery Remaining Longevity: 47 mo
Battery Remaining Percentage: 56 %
Battery Voltage: 2.92 V
Brady Statistic AP VP Percent: 1 %
Brady Statistic AP VS Percent: 76 %
Brady Statistic AS VP Percent: 1 %
Brady Statistic AS VS Percent: 23 %
Brady Statistic RA Percent Paced: 42 %
Brady Statistic RV Percent Paced: 22 %
Date Time Interrogation Session: 20210104141345
HighPow Impedance: 65 Ohm
HighPow Impedance: 65 Ohm
Implantable Lead Implant Date: 20090708
Implantable Lead Implant Date: 20090708
Implantable Lead Location: 753859
Implantable Lead Location: 753860
Implantable Lead Model: 7122
Implantable Pulse Generator Implant Date: 20170106
Lead Channel Impedance Value: 330 Ohm
Lead Channel Impedance Value: 390 Ohm
Lead Channel Pacing Threshold Amplitude: 0.5 V
Lead Channel Pacing Threshold Amplitude: 1 V
Lead Channel Pacing Threshold Pulse Width: 0.5 ms
Lead Channel Pacing Threshold Pulse Width: 0.9 ms
Lead Channel Sensing Intrinsic Amplitude: 12 mV
Lead Channel Sensing Intrinsic Amplitude: 2.3 mV
Lead Channel Setting Pacing Amplitude: 2 V
Lead Channel Setting Pacing Amplitude: 2.5 V
Lead Channel Setting Pacing Pulse Width: 0.9 ms
Lead Channel Setting Sensing Sensitivity: 0.5 mV
Pulse Gen Serial Number: 7306179

## 2019-10-03 MED ORDER — CYANOCOBALAMIN 1000 MCG/ML IJ SOLN
1000.0000 ug | Freq: Once | INTRAMUSCULAR | Status: AC
  Administered 2019-10-03: 1000 ug via INTRAMUSCULAR

## 2019-10-03 NOTE — Patient Instructions (Signed)
Cyanocobalamin, Pyridoxine, and Folate What is this medicine? A multivitamin containing folic acid, vitamin B6, and vitamin B12. This medicine may be used for other purposes; ask your health care provider or pharmacist if you have questions. COMMON BRAND NAME(S): AllanFol RX, AllanTex, Av-Vite FB, B Complex with Folic Acid, ComBgen, FaBB, Folamin, Folastin, Folbalin, Folbee, Folbic, Folcaps, Folgard, Folgard RX, Folgard RX 2.2, Folplex, Folplex 2.2, Foltabs 800, Foltx, Homocysteine Formula, Niva-Fol, NuFol, TL Gard RX, Virt-Gard, Virt-Vite, Virt-Vite Forte, Vita-Respa What should I tell my health care provider before I take this medicine? They need to know if you have any of these conditions:  bleeding or clotting disorder  history of anemia of any type  other chronic health condition  an unusual or allergic reaction to vitamins, other medicines, foods, dyes, or preservatives  pregnant or trying to get pregnant  breast-feeding How should I use this medicine? Take by mouth with a glass of water. May take with food. Follow the directions on the prescription label. It is usually given once a day. Do not take your medicine more often than directed. Contact your pediatrician regarding the use of this medicine in children. Special care may be needed. Overdosage: If you think you have taken too much of this medicine contact a poison control center or emergency room at once. NOTE: This medicine is only for you. Do not share this medicine with others. What if I miss a dose? If you miss a dose, take it as soon as you can. If it is almost time for your next dose, take only that dose. Do not take double or extra doses. What may interact with this medicine?  levodopa This list may not describe all possible interactions. Give your health care provider a list of all the medicines, herbs, non-prescription drugs, or dietary supplements you use. Also tell them if you smoke, drink alcohol, or use illegal  drugs. Some items may interact with your medicine. What should I watch for while using this medicine? See your health care professional for regular checks on your progress. Remember that vitamin supplements do not replace the need for good nutrition from a balanced diet. What side effects may I notice from receiving this medicine? Side effects that you should report to your doctor or health care professional as soon as possible:  allergic reaction such as skin rash or difficulty breathing  vomiting Side effects that usually do not require medical attention (report to your doctor or health care professional if they continue or are bothersome):  nausea  stomach upset This list may not describe all possible side effects. Call your doctor for medical advice about side effects. You may report side effects to FDA at 1-800-FDA-1088. Where should I keep my medicine? Keep out of the reach of children. Most vitamins should be stored at controlled room temperature. Check your specific product directions. Protect from heat and moisture. Throw away any unused medicine after the expiration date. NOTE: This sheet is a summary. It may not cover all possible information. If you have questions about this medicine, talk to your doctor, pharmacist, or health care provider.  2020 Elsevier/Gold Standard (2007-11-06 00:59:55)  

## 2019-10-16 ENCOUNTER — Other Ambulatory Visit: Payer: Self-pay

## 2019-10-16 ENCOUNTER — Emergency Department (HOSPITAL_COMMUNITY): Payer: Medicare HMO

## 2019-10-16 ENCOUNTER — Emergency Department (HOSPITAL_COMMUNITY)
Admission: EM | Admit: 2019-10-16 | Discharge: 2019-10-16 | Disposition: A | Discharge status: Home / Self Care | Payer: Medicare HMO | Attending: Emergency Medicine | Admitting: Emergency Medicine

## 2019-10-16 ENCOUNTER — Encounter (HOSPITAL_COMMUNITY): Payer: Self-pay | Admitting: *Deleted

## 2019-10-16 DIAGNOSIS — Z79899 Other long term (current) drug therapy: Secondary | ICD-10-CM | POA: Insufficient documentation

## 2019-10-16 DIAGNOSIS — Z951 Presence of aortocoronary bypass graft: Secondary | ICD-10-CM | POA: Diagnosis not present

## 2019-10-16 DIAGNOSIS — I10 Essential (primary) hypertension: Secondary | ICD-10-CM | POA: Diagnosis not present

## 2019-10-16 DIAGNOSIS — Z95810 Presence of automatic (implantable) cardiac defibrillator: Secondary | ICD-10-CM | POA: Diagnosis not present

## 2019-10-16 DIAGNOSIS — Y9222 Religious institution as the place of occurrence of the external cause: Secondary | ICD-10-CM | POA: Diagnosis not present

## 2019-10-16 DIAGNOSIS — W010XXA Fall on same level from slipping, tripping and stumbling without subsequent striking against object, initial encounter: Secondary | ICD-10-CM | POA: Diagnosis not present

## 2019-10-16 DIAGNOSIS — Z96651 Presence of right artificial knee joint: Secondary | ICD-10-CM | POA: Diagnosis not present

## 2019-10-16 DIAGNOSIS — E119 Type 2 diabetes mellitus without complications: Secondary | ICD-10-CM | POA: Diagnosis not present

## 2019-10-16 DIAGNOSIS — Z87891 Personal history of nicotine dependence: Secondary | ICD-10-CM | POA: Diagnosis not present

## 2019-10-16 DIAGNOSIS — M25552 Pain in left hip: Secondary | ICD-10-CM

## 2019-10-16 DIAGNOSIS — Z7984 Long term (current) use of oral hypoglycemic drugs: Secondary | ICD-10-CM | POA: Diagnosis not present

## 2019-10-16 DIAGNOSIS — S7002XA Contusion of left hip, initial encounter: Secondary | ICD-10-CM

## 2019-10-16 DIAGNOSIS — I2510 Atherosclerotic heart disease of native coronary artery without angina pectoris: Secondary | ICD-10-CM | POA: Insufficient documentation

## 2019-10-16 DIAGNOSIS — Y939 Activity, unspecified: Secondary | ICD-10-CM | POA: Insufficient documentation

## 2019-10-16 DIAGNOSIS — Y999 Unspecified external cause status: Secondary | ICD-10-CM | POA: Insufficient documentation

## 2019-10-16 DIAGNOSIS — S70912A Unspecified superficial injury of left hip, initial encounter: Secondary | ICD-10-CM | POA: Diagnosis present

## 2019-10-16 NOTE — ED Provider Notes (Signed)
[facility] EMERGENCY DEPARTMENT Provider Note   CSN: [id number] Arrival date & time: 10/16/19  1701     History Chief Complaint  Patient presents with  . Fall    [name] is a 74 y.o. male.  Status post accidental trip and fall at church several days ago striking his left lateral hip.  No other injuries.  No head or neck trauma.  He is able to walk, but painfully.  Severity is moderate.  Palpation makes pain worse.        Past Medical History:  Diagnosis Date  . Anemia    Multifactorial  - hx of B12 def and iron deficiency, followed by heme[facility]  . Automatic implantable cardioverter-defibrillator in situ   . CAD (coronary artery disease)    a. STEMI s/p CABG 07/2007 (had preop cardiogenic shock, IABP, VDRF before surgery)  . Cardiomyopathy, ischemic    a. Chronic systolic CHF s/p St. Jude dual chamber ICD 03/2008.  . Carotid stenosis    a. Prior carotid dz/ surgery. b. Carotid dopp 07/2011 - 0-39% bilaterally.;   c. Doppler 11/13: 78-58% RICA, 8-50% LICA  . Chronic renal insufficiency   . Diabetes mellitus   . GI bleed    Reported history  . Hx of CABG   . Hypertension    [facility]   . Mitral regurgitation   . Myocardial infarction ([place])   . Neuromuscular disorder ([place])    R sided weakness   . Osteoarthritis   . PAF (paroxysmal atrial fibrillation) ([place])    a. Poor Coumadin candidate due to hx of GI bleed.  . Sinus bradycardia   . Sleep apnea    study done, 10 yrs ago, unable to tolerate CPAP  . Splenomegaly   . Stroke [facility])    a. CVA 1990s - chronic pain in RUE after stroke.  [name] Systolic CHF, chronic (HCC)    Previously taken off lisinopril by primary doctor due to labs  . Tobacco abuse     Patient Active Problem List   Diagnosis Date Noted  . Dizziness 04/27/2019  . CVA (cerebral vascular accident) ([place]) 04/27/2019  . Vertebral artery obstruction, left 04/27/2019  . Acute appendicitis, uncomplicated  27/74/1287  . Dry eye 02/20/2016  . CHF (congestive heart failure) ([place])   . Elevated LFTs 02/13/2016  . Hypokalemia 02/13/2016  . Chronic combined systolic and diastolic CHF (congestive heart failure) ([place]) 02/13/2016  . Prolonged Q-T interval on ECG 02/13/2016  . Biliary colic 86/76/7209  . Left ear pain 02/13/2016  . Thrombocytopenia ([place]) 02/13/2016  . Right knee pain 10/13/2013  . Abnormality of gait 10/13/2013  . Stiffness of right knee 10/13/2013  . Arthritis of knee, right 08/16/2013  . Cholecystitis with cholelithiasis 03/07/2013  . Automatic implantable cardioverter-defibrillator in situ 03/13/2009  . DM (diabetes mellitus), secondary, with peripheral vascular complications ([place]) 47/05/6282  . Iron deficiency anemia 03/12/2009  . Essential hypertension 03/12/2009  . CARDIOMYOPATHY, ISCHEMIC 03/12/2009  . CEREBROVASCULAR DISEASE 03/12/2009  . PVD 03/12/2009  . RENAL INSUFFICIENCY, CHRONIC 03/12/2009  . OSTEOARTHRITIS 03/12/2009  . CORONARY ARTERY BYPASS GRAFT, HX OF 03/12/2009    Past Surgical History:  Procedure Laterality Date  . CARDIAC DEFIBRILLATOR PLACEMENT  04/05/2008   Implantation of a St. Jude dual-chamber defibrillator, [place]. [name] , [place]    . CHOLECYSTECTOMY N/A 02/19/2016   Procedure: LAPAROSCOPIC CHOLECYSTECTOMY;  Surgeon: Coralie Keens, MD;  Location: [facility];  Service:  General;  Laterality: N/A;  . COLONOSCOPY  07/2007   Rourk: left-sided diverticulum. TI normal. No etiology for IDA.  [name] CORONARY ARTERY BYPASS GRAFT    . CORONARY ARTERY BYPASS GRAFT  08/29/2007   x3; Marilu Favre H. Cornelius Moras MD  . EP IMPLANTABLE DEVICE N/A 10/05/2015   Procedure: ICD Generator Changeout;  Surgeon: Marinus Maw, MD;  Location: [facility] INVASIVE CV LAB;  Service: Cardiovascular;  Laterality: N/A;  . ERCP N/A 02/18/2016   Procedure: ENDOSCOPIC RETROGRADE CHOLANGIOPANCREATOGRAPHY (ERCP);  Surgeon: Vida Rigger, MD;  Location: [facility] ENDOSCOPY;  Service:  Endoscopy;  Laterality: N/A;  . ESOPHAGOGASTRODUODENOSCOPY  07/2007   Rourk: small hh, atrophic gastric mucosa, poor distensibility of stomach ?extrinsic compression, CT showed mild to moderate splenomegaly. No etiology for IDA.  [name] JOINT REPLACEMENT  1990's   L knee  . LAPAROSCOPIC APPENDECTOMY N/A 09/14/2017   Procedure: APPENDECTOMY LAPAROSCOPIC;  Surgeon: Jimmye Norman, MD;  Location: [facility] OR;  Service: General;  Laterality: N/A;  . Percutaneous coronary intervention  01/10/2005   Charlies Constable, MD  . TONSILLECTOMY    . TOTAL KNEE ARTHROPLASTY Right 08/16/2013   Procedure: RIGHT TOTAL KNEE ARTHROPLASTY;  Surgeon: Kathryne Hitch, MD;  Location: [facility] OR;  Service: Orthopedics;  Laterality: Right;       Family History  Problem Relation Age of Onset  . Heart attack Mother        CVA, MI  . Diabetes Mother   . Heart attack Father        MI  . Diabetes Sister   . Coronary artery disease Brother        CABG    Social History   Tobacco Use  . Smoking status: Former Smoker    Packs/day: 1.00    Years: 15.00    Pack years: 15.00    Quit date: 09/29/1990    Years since quitting: 29.0  . Smokeless tobacco: Former Neurosurgeon    Quit date: 08/09/1988  Substance Use Topics  . Alcohol use: No  . Drug use: No    Home Medications Prior to Admission medications   Medication Sig Start Date End Date Taking? Authorizing Provider  baclofen (LIORESAL) 20 MG tablet Take 40 mg by mouth at bedtime.     [provider]  carvedilol (COREG) 12.5 MG tablet Take 1 tablet (12.5 mg total) by mouth 2 (two) times daily with a meal. 05/13/19   Marinus Maw, MD  ferrous sulfate 325 (65 FE) MG tablet Take 1 tablet (325 mg total) by mouth 2 (two) times daily with a meal. 04/28/19 06/27/19  Amin, Loura Halt, MD  furosemide (LASIX) 40 MG tablet Take 40 mg by mouth daily as needed for fluid.     [provider]  glimepiride (AMARYL) 4 MG tablet Take 4 mg by mouth daily with breakfast.     [provider]  linagliptin (TRADJENTA) 5 MG TABS tablet Take 5 mg by mouth daily after breakfast.     [provider]  meclizine (ANTIVERT) 25 MG tablet Take 25 mg by mouth 4 (four) times daily as needed. For dizziness 02/26/12   [provider]  montelukast (SINGULAIR) 10 MG tablet Take 10 mg by mouth at bedtime.  03/29/19   [provider]  oxyCODONE-acetaminophen (PERCOCET/ROXICET) 5-325 MG tablet Take a half tablet every 6 hours 04/18/19   [provider]  rivaroxaban (XARELTO) 20 MG TABS tablet Take 1 tablet (20 mg total) by mouth daily with supper. 10/11/18   Lewayne Bunting  W, MD  SENNA CO Take 1 tablet by mouth daily as needed (constipation).  01/10/14   [provider]  spironolactone (ALDACTONE) 25 MG tablet Take 1 tablet (25 mg total) by mouth every morning. 07/20/14   Marinus Maw, MD    Allergies    Codeine  Review of Systems   Review of Systems  All other systems reviewed and are negative.   Physical Exam Updated Vital Signs BP 129/77   Pulse 98   Temp 97.7 F (36.5 C) (Oral)   Resp 16   Ht 6\' 1"  (1.854 m)   Wt 95.3 kg   SpO2 100%   BMI 27.71 kg/m   Physical Exam Vitals and nursing note reviewed.  Constitutional:      Appearance: He is well-developed.  HENT:     Head: Normocephalic and atraumatic.  Eyes:     Conjunctiva/sclera: Conjunctivae normal.  Cardiovascular:     Rate and Rhythm: Normal rate.  Pulmonary:     Effort: Pulmonary effort is normal.  Musculoskeletal:     Cervical back: Neck supple.     Comments: Left hip: Tender lateral aspect; pain with range of motion  Skin:    General: Skin is warm and dry.  Neurological:     General: No focal deficit present.     Mental Status: He is alert and oriented to person, place, and time.  Psychiatric:        Behavior: Behavior normal.     ED Results / Procedures / Treatments   Labs (all labs ordered are listed, but only abnormal results are  displayed) Labs Reviewed - No data to display  EKG None  Radiology DG HIP UNILAT WITH PELVIS 2-3 VIEWS LEFT  Result Date: 10/16/2019 CLINICAL DATA:  Left hip pain post fall. EXAM: DG HIP (WITH OR WITHOUT PELVIS) 2-3V LEFT COMPARISON:  None. FINDINGS: There is no evidence of hip fracture or dislocation. There is no evidence of arthropathy or other focal bone abnormality. IMPRESSION: Negative. Electronically Signed   By: 10/18/2019 M.D.   On: 10/16/2019 17:42    Procedures Procedures (including critical care time)  Medications Ordered in ED Medications - No data to display  ED Course  I have reviewed the triage vital signs and the nursing notes.  Pertinent labs & imaging results that were available during my care of the patient were reviewed by me and considered in my medical decision making (see chart for details).    MDM Rules/Calculators/A&P                      Plain films of left hip negative.  Follow-up with primary care. Final Clinical Impression(s) / ED Diagnoses Final diagnoses:  Contusion of left hip, initial encounter    Rx / DC Orders ED Discharge Orders    None       10/18/2019, MD 10/16/19 [phone]

## 2019-10-16 NOTE — ED Triage Notes (Signed)
Pt states that he fell a few days ago while walking through the church pews, c/o pain to left hip area

## 2019-10-16 NOTE — Discharge Instructions (Addendum)
X-ray showed no fracture.  You will be sore for several days.  Ice pack.  Tylenol.

## 2019-10-31 ENCOUNTER — Other Ambulatory Visit: Payer: Self-pay

## 2019-10-31 ENCOUNTER — Inpatient Hospital Stay: Payer: Medicare HMO | Attending: Oncology

## 2019-10-31 VITALS — BP 134/76 | HR 95 | Temp 98.3°F | Resp 18

## 2019-10-31 DIAGNOSIS — D509 Iron deficiency anemia, unspecified: Secondary | ICD-10-CM

## 2019-10-31 DIAGNOSIS — E538 Deficiency of other specified B group vitamins: Secondary | ICD-10-CM | POA: Insufficient documentation

## 2019-10-31 MED ORDER — CYANOCOBALAMIN 1000 MCG/ML IJ SOLN
INTRAMUSCULAR | Status: AC
  Filled 2019-10-31: qty 1

## 2019-10-31 MED ORDER — CYANOCOBALAMIN 1000 MCG/ML IJ SOLN
1000.0000 ug | Freq: Once | INTRAMUSCULAR | Status: AC
  Administered 2019-10-31: 09:00:00 1000 ug via INTRAMUSCULAR

## 2019-10-31 NOTE — Patient Instructions (Signed)
Cyanocobalamin, Pyridoxine, and Folate What is this medicine? A multivitamin containing folic acid, vitamin B6, and vitamin B12. This medicine may be used for other purposes; ask your health care provider or pharmacist if you have questions. COMMON BRAND NAME(S): AllanFol RX, AllanTex, Av-Vite FB, B Complex with Folic Acid, ComBgen, FaBB, Folamin, Folastin, Folbalin, Folbee, Folbic, Folcaps, Folgard, Folgard RX, Folgard RX 2.2, Folplex, Folplex 2.2, Foltabs 800, Foltx, Homocysteine Formula, Niva-Fol, NuFol, TL Gard RX, Virt-Gard, Virt-Vite, Virt-Vite Forte, Vita-Respa What should I tell my health care provider before I take this medicine? They need to know if you have any of these conditions:  bleeding or clotting disorder  history of anemia of any type  other chronic health condition  an unusual or allergic reaction to vitamins, other medicines, foods, dyes, or preservatives  pregnant or trying to get pregnant  breast-feeding How should I use this medicine? Take by mouth with a glass of water. May take with food. Follow the directions on the prescription label. It is usually given once a day. Do not take your medicine more often than directed. Contact your pediatrician regarding the use of this medicine in children. Special care may be needed. Overdosage: If you think you have taken too much of this medicine contact a poison control center or emergency room at once. NOTE: This medicine is only for you. Do not share this medicine with others. What if I miss a dose? If you miss a dose, take it as soon as you can. If it is almost time for your next dose, take only that dose. Do not take double or extra doses. What may interact with this medicine?  levodopa This list may not describe all possible interactions. Give your health care provider a list of all the medicines, herbs, non-prescription drugs, or dietary supplements you use. Also tell them if you smoke, drink alcohol, or use illegal  drugs. Some items may interact with your medicine. What should I watch for while using this medicine? See your health care professional for regular checks on your progress. Remember that vitamin supplements do not replace the need for good nutrition from a balanced diet. What side effects may I notice from receiving this medicine? Side effects that you should report to your doctor or health care professional as soon as possible:  allergic reaction such as skin rash or difficulty breathing  vomiting Side effects that usually do not require medical attention (report to your doctor or health care professional if they continue or are bothersome):  nausea  stomach upset This list may not describe all possible side effects. Call your doctor for medical advice about side effects. You may report side effects to FDA at 1-800-FDA-1088. Where should I keep my medicine? Keep out of the reach of children. Most vitamins should be stored at controlled room temperature. Check your specific product directions. Protect from heat and moisture. Throw away any unused medicine after the expiration date. NOTE: This sheet is a summary. It may not cover all possible information. If you have questions about this medicine, talk to your doctor, pharmacist, or health care provider.  2020 Elsevier/Gold Standard (2007-11-06 00:59:55)  

## 2019-11-11 ENCOUNTER — Other Ambulatory Visit: Payer: Self-pay | Admitting: Internal Medicine

## 2019-11-11 NOTE — Telephone Encounter (Signed)
Pt last saw Dr Ladona Ridgel 12/30/18 telemedicine Covid-19, last labs 04/28/19 Creat 0.88, age 74, weight 95.3kg, CrCl 102.28, based on CrCl pt is on appropriate dosage of Xarelto 20mg  QD.  Will refill rx.

## 2019-11-28 ENCOUNTER — Other Ambulatory Visit: Payer: Self-pay

## 2019-11-29 ENCOUNTER — Other Ambulatory Visit: Payer: Self-pay

## 2019-11-29 ENCOUNTER — Inpatient Hospital Stay: Payer: Medicare HMO | Attending: Oncology

## 2019-11-29 VITALS — BP 128/72 | HR 78 | Temp 98.2°F | Resp 18

## 2019-11-29 DIAGNOSIS — E538 Deficiency of other specified B group vitamins: Secondary | ICD-10-CM | POA: Diagnosis not present

## 2019-11-29 DIAGNOSIS — D509 Iron deficiency anemia, unspecified: Secondary | ICD-10-CM

## 2019-11-29 MED ORDER — CYANOCOBALAMIN 1000 MCG/ML IJ SOLN
INTRAMUSCULAR | Status: AC
  Filled 2019-11-29: qty 1

## 2019-11-29 MED ORDER — CYANOCOBALAMIN 1000 MCG/ML IJ SOLN
1000.0000 ug | Freq: Once | INTRAMUSCULAR | Status: AC
  Administered 2019-11-29: 09:00:00 1000 ug via INTRAMUSCULAR

## 2019-11-29 NOTE — Patient Instructions (Signed)
Cyanocobalamin, Pyridoxine, and Folate What is this medicine? A multivitamin containing folic acid, vitamin B6, and vitamin B12. This medicine may be used for other purposes; ask your health care provider or pharmacist if you have questions. COMMON BRAND NAME(S): AllanFol RX, AllanTex, Av-Vite FB, B Complex with Folic Acid, ComBgen, FaBB, Folamin, Folastin, Folbalin, Folbee, Folbic, Folcaps, Folgard, Folgard RX, Folgard RX 2.2, Folplex, Folplex 2.2, Foltabs 800, Foltx, Homocysteine Formula, Niva-Fol, NuFol, TL Gard RX, Virt-Gard, Virt-Vite, Virt-Vite Forte, Vita-Respa What should I tell my health care provider before I take this medicine? They need to know if you have any of these conditions:  bleeding or clotting disorder  history of anemia of any type  other chronic health condition  an unusual or allergic reaction to vitamins, other medicines, foods, dyes, or preservatives  pregnant or trying to get pregnant  breast-feeding How should I use this medicine? Take by mouth with a glass of water. May take with food. Follow the directions on the prescription label. It is usually given once a day. Do not take your medicine more often than directed. Contact your pediatrician regarding the use of this medicine in children. Special care may be needed. Overdosage: If you think you have taken too much of this medicine contact a poison control center or emergency room at once. NOTE: This medicine is only for you. Do not share this medicine with others. What if I miss a dose? If you miss a dose, take it as soon as you can. If it is almost time for your next dose, take only that dose. Do not take double or extra doses. What may interact with this medicine?  levodopa This list may not describe all possible interactions. Give your health care provider a list of all the medicines, herbs, non-prescription drugs, or dietary supplements you use. Also tell them if you smoke, drink alcohol, or use illegal  drugs. Some items may interact with your medicine. What should I watch for while using this medicine? See your health care professional for regular checks on your progress. Remember that vitamin supplements do not replace the need for good nutrition from a balanced diet. What side effects may I notice from receiving this medicine? Side effects that you should report to your doctor or health care professional as soon as possible:  allergic reaction such as skin rash or difficulty breathing  vomiting Side effects that usually do not require medical attention (report to your doctor or health care professional if they continue or are bothersome):  nausea  stomach upset This list may not describe all possible side effects. Call your doctor for medical advice about side effects. You may report side effects to FDA at 1-800-FDA-1088. Where should I keep my medicine? Keep out of the reach of children. Most vitamins should be stored at controlled room temperature. Check your specific product directions. Protect from heat and moisture. Throw away any unused medicine after the expiration date. NOTE: This sheet is a summary. It may not cover all possible information. If you have questions about this medicine, talk to your doctor, pharmacist, or health care provider.  2020 Elsevier/Gold Standard (2007-11-06 00:59:55)  

## 2020-01-02 ENCOUNTER — Ambulatory Visit (INDEPENDENT_AMBULATORY_CARE_PROVIDER_SITE_OTHER): Payer: Medicare HMO | Admitting: *Deleted

## 2020-01-02 ENCOUNTER — Other Ambulatory Visit: Payer: Self-pay

## 2020-01-02 DIAGNOSIS — I509 Heart failure, unspecified: Secondary | ICD-10-CM | POA: Diagnosis not present

## 2020-01-02 DIAGNOSIS — D509 Iron deficiency anemia, unspecified: Secondary | ICD-10-CM

## 2020-01-03 ENCOUNTER — Inpatient Hospital Stay: Payer: Medicare HMO | Attending: Oncology | Admitting: Oncology

## 2020-01-03 ENCOUNTER — Other Ambulatory Visit: Payer: Self-pay

## 2020-01-03 ENCOUNTER — Inpatient Hospital Stay: Payer: Medicare HMO

## 2020-01-03 ENCOUNTER — Telehealth: Payer: Self-pay

## 2020-01-03 VITALS — BP 128/83 | HR 96 | Temp 98.7°F | Resp 20 | Ht 73.0 in | Wt 215.1 lb

## 2020-01-03 DIAGNOSIS — D508 Other iron deficiency anemias: Secondary | ICD-10-CM | POA: Diagnosis present

## 2020-01-03 DIAGNOSIS — E538 Deficiency of other specified B group vitamins: Secondary | ICD-10-CM | POA: Diagnosis not present

## 2020-01-03 DIAGNOSIS — K909 Intestinal malabsorption, unspecified: Secondary | ICD-10-CM | POA: Diagnosis present

## 2020-01-03 DIAGNOSIS — D509 Iron deficiency anemia, unspecified: Secondary | ICD-10-CM

## 2020-01-03 LAB — CBC WITH DIFFERENTIAL (CANCER CENTER ONLY)
Abs Immature Granulocytes: 0.03 10*3/uL (ref 0.00–0.07)
Basophils Absolute: 0 10*3/uL (ref 0.0–0.1)
Basophils Relative: 0 %
Eosinophils Absolute: 0.2 10*3/uL (ref 0.0–0.5)
Eosinophils Relative: 2 %
HCT: 41.5 % (ref 39.0–52.0)
Hemoglobin: 12.9 g/dL — ABNORMAL LOW (ref 13.0–17.0)
Immature Granulocytes: 0 %
Lymphocytes Relative: 17 %
Lymphs Abs: 1.3 10*3/uL (ref 0.7–4.0)
MCH: 27.1 pg (ref 26.0–34.0)
MCHC: 31.1 g/dL (ref 30.0–36.0)
MCV: 87.2 fL (ref 80.0–100.0)
Monocytes Absolute: 1 10*3/uL (ref 0.1–1.0)
Monocytes Relative: 13 %
Neutro Abs: 5.2 10*3/uL (ref 1.7–7.7)
Neutrophils Relative %: 68 %
Platelet Count: 197 10*3/uL (ref 150–400)
RBC: 4.76 MIL/uL (ref 4.22–5.81)
RDW: 14.5 % (ref 11.5–15.5)
WBC Count: 7.7 10*3/uL (ref 4.0–10.5)
nRBC: 0 % (ref 0.0–0.2)

## 2020-01-03 LAB — IRON AND TIBC
Iron: 21 ug/dL — ABNORMAL LOW (ref 42–163)
Saturation Ratios: 5 % — ABNORMAL LOW (ref 20–55)
TIBC: 381 ug/dL (ref 202–409)
UIBC: 360 ug/dL (ref 117–376)

## 2020-01-03 LAB — FERRITIN: Ferritin: 15 ng/mL — ABNORMAL LOW (ref 24–336)

## 2020-01-03 LAB — VITAMIN B12: Vitamin B-12: 275 pg/mL (ref 180–914)

## 2020-01-03 MED ORDER — CYANOCOBALAMIN 1000 MCG/ML IJ SOLN
1000.0000 ug | Freq: Once | INTRAMUSCULAR | Status: AC
  Administered 2020-01-03: 1000 ug via INTRAMUSCULAR

## 2020-01-03 MED ORDER — CYANOCOBALAMIN 1000 MCG/ML IJ SOLN
INTRAMUSCULAR | Status: AC
  Filled 2020-01-03: qty 1

## 2020-01-03 NOTE — Patient Instructions (Signed)
Cyanocobalamin, Pyridoxine, and Folate What is this medicine? A multivitamin containing folic acid, vitamin B6, and vitamin B12. This medicine may be used for other purposes; ask your health care provider or pharmacist if you have questions. COMMON BRAND NAME(S): AllanFol RX, AllanTex, Av-Vite FB, B Complex with Folic Acid, ComBgen, FaBB, Folamin, Folastin, Folbalin, Folbee, Folbic, Folcaps, Folgard, Folgard RX, Folgard RX 2.2, Folplex, Folplex 2.2, Foltabs 800, Foltx, Homocysteine Formula, Niva-Fol, NuFol, TL Gard RX, Virt-Gard, Virt-Vite, Virt-Vite Forte, Vita-Respa What should I tell my health care provider before I take this medicine? They need to know if you have any of these conditions:  bleeding or clotting disorder  history of anemia of any type  other chronic health condition  an unusual or allergic reaction to vitamins, other medicines, foods, dyes, or preservatives  pregnant or trying to get pregnant  breast-feeding How should I use this medicine? Take by mouth with a glass of water. May take with food. Follow the directions on the prescription label. It is usually given once a day. Do not take your medicine more often than directed. Contact your pediatrician regarding the use of this medicine in children. Special care may be needed. Overdosage: If you think you have taken too much of this medicine contact a poison control center or emergency room at once. NOTE: This medicine is only for you. Do not share this medicine with others. What if I miss a dose? If you miss a dose, take it as soon as you can. If it is almost time for your next dose, take only that dose. Do not take double or extra doses. What may interact with this medicine?  levodopa This list may not describe all possible interactions. Give your health care provider a list of all the medicines, herbs, non-prescription drugs, or dietary supplements you use. Also tell them if you smoke, drink alcohol, or use illegal  drugs. Some items may interact with your medicine. What should I watch for while using this medicine? See your health care professional for regular checks on your progress. Remember that vitamin supplements do not replace the need for good nutrition from a balanced diet. What side effects may I notice from receiving this medicine? Side effects that you should report to your doctor or health care professional as soon as possible:  allergic reaction such as skin rash or difficulty breathing  vomiting Side effects that usually do not require medical attention (report to your doctor or health care professional if they continue or are bothersome):  nausea  stomach upset This list may not describe all possible side effects. Call your doctor for medical advice about side effects. You may report side effects to FDA at 1-800-FDA-1088. Where should I keep my medicine? Keep out of the reach of children. Most vitamins should be stored at controlled room temperature. Check your specific product directions. Protect from heat and moisture. Throw away any unused medicine after the expiration date. NOTE: This sheet is a summary. It may not cover all possible information. If you have questions about this medicine, talk to your doctor, pharmacist, or health care provider.  2020 Elsevier/Gold Standard (2007-11-06 00:59:55)  

## 2020-01-03 NOTE — Telephone Encounter (Signed)
Called patient and let him know Dr. Alver Fisher message below. Made patient aware to expect a call from the scheduling department.

## 2020-01-03 NOTE — Addendum Note (Signed)
Addended by: Benjiman Core on: 01/03/2020 10:16 AM   Modules accepted: Orders

## 2020-01-03 NOTE — Telephone Encounter (Signed)
-----   Message from Benjiman Core, MD sent at 01/03/2020 10:09 AM EDT ----- Please let him know his iron low and will need IV iron. I will send a message to scheduling.

## 2020-01-03 NOTE — Progress Notes (Signed)
[facility] HEMATOLOGY OFFICE PROGRESS NOTE 01/03/20    DIAGNOSIS: 74 year old man with iron deficiency anemia in addition to B12 deficiency related to poor absorption diagnosed in 2008.     CURRENT THERAPY:    B-12 1000 mcg IM injection on a monthly basis.  Feraheme infusion as needed.  He has not required it since October 2019.  INTERVAL HISTORY:  [name] returns today for a follow-up visit.  Since last visit, he reports no major changes in his health.  He denies any excessive fatigue, tiredness or hematochezia.  He denies any hemoptysis or epistaxis.  Continues to ambulate without any difficulties at this time.  His performance status and quality of life is unchanged.      ALLERGIES:  is allergic to codeine.  MEDICATIONS: Reviewed without changes.  Current Outpatient Medications  Medication Sig Dispense Refill  . baclofen (LIORESAL) 20 MG tablet Take 40 mg by mouth at bedtime.     . carvedilol (COREG) 12.5 MG tablet Take 1 tablet (12.5 mg total) by mouth 2 (two) times daily with a meal. 180 tablet 2  . ferrous sulfate 325 (65 FE) MG tablet Take 1 tablet (325 mg total) by mouth 2 (two) times daily with a meal. 120 tablet 0  . furosemide (LASIX) 40 MG tablet Take 40 mg by mouth daily as needed for fluid.     [name] glimepiride (AMARYL) 4 MG tablet Take 4 mg by mouth daily with breakfast.    . linagliptin (TRADJENTA) 5 MG TABS tablet Take 5 mg by mouth daily after breakfast.     . meclizine (ANTIVERT) 25 MG tablet Take 25 mg by mouth 4 (four) times daily as needed. For dizziness    . montelukast (SINGULAIR) 10 MG tablet Take 10 mg by mouth at bedtime.     [name] oxyCODONE-acetaminophen (PERCOCET/ROXICET) 5-325 MG tablet Take a half tablet every 6 hours    . SENNA CO Take 1 tablet by mouth daily as needed (constipation).     [name] spironolactone (ALDACTONE) 25 MG tablet Take 1 tablet (25 mg total) by mouth every morning. 90 tablet 3  . XARELTO 20 MG TABS tablet TAKE 1  TABLET BY MOUTH DAILY WITH SUPPER 30 tablet 5   No current facility-administered medications for this visit.     PHYSICAL EXAMINATION:  Blood pressure 128/83, pulse 96, temperature 98.7 F (37.1 C), temperature source Temporal, resp. rate 20, height 6\' 1"  (1.854 m), weight 215 lb 1.6 oz (97.6 kg), SpO2 99 %.   ECOG PERFORMANCE STATUS: 1     General appearance: Alert, awake without any distress. Head: Atraumatic without abnormalities Oropharynx: Without any thrush or ulcers. Eyes: No scleral icterus. Lymph nodes: No lymphadenopathy noted in the cervical, supraclavicular, or axillary nodes Heart:regular rate and rhythm, without any murmurs or gallops.   Lung: Clear to auscultation without any rhonchi, wheezes or dullness to percussion. Abdomin: Soft, nontender without any shifting dullness or ascites. Musculoskeletal: No clubbing or cyanosis. Neurological: No motor or sensory deficits. Skin: No rashes or lesions.      Labs:  Lab Results  Component Value Date   WBC 7.8 07/01/2019   HGB 14.4 07/01/2019   HCT 44.1 07/01/2019   MCV 91.9 07/01/2019   PLT 191 07/01/2019   NEUTROABS 5.0 07/01/2019      Chemistry      Component Value Date/Time   NA 142 04/28/2019 0602   NA 141 06/02/2017 0952   K 4.0 04/28/2019 0602   K 4.0  06/02/2017 0952   CL 107 04/28/2019 0602   CL 100 01/06/2013 0830   CO2 28 04/28/2019 0602   CO2 28 06/02/2017 0952   BUN 13 04/28/2019 0602   BUN 15.7 06/02/2017 0952   CREATININE 0.88 04/28/2019 0602   CREATININE 0.9 06/02/2017 0952      Component Value Date/Time   CALCIUM 8.6 (L) 04/28/2019 0602   CALCIUM 9.4 06/02/2017 0952   ALKPHOS 98 04/27/2019 0153   ALKPHOS 81 06/02/2017 0952   AST 15 04/27/2019 0153   AST 13 06/02/2017 0952   ALT 11 04/27/2019 0153   ALT 9 06/02/2017 0952   BILITOT 0.5 04/27/2019 0153   BILITOT 0.46 06/02/2017 0952        Assessment and plan:    74 year old man with:   1.  Anemia diagnosed in 2008.   This is related to iron deficiency as well as B12 deficiency and poor absorption.     He is currently on B12 monthly injections.  Risks and benefits of continuing this approach were reviewed.  Laboratory data in the last 6 months were also discussed and showed adequate hemoglobin as well as B 12 level.  He is agreeable to proceed at this time.   2. Iron deficiency: He has received intravenous iron in the past with iron studies normalizing currently.  Iron studies on October 2 showed iron level of 45 and ferritin of 58.  We will continue to monitor iron studies and replace with intravenous iron as needed.  Complications including arthralgias myalgias as well as infusion related issues or reviewed.  He is agreeable to receive that if needed in the future.  3. Follow-up: = He will continue to receive monthly B12 injections.  MD follow-up will be in 6 months  30  minutes were dedicated to this visit. The time was spent on reviewing laboratory data, discussing treatment options, and answering questions regarding future plan.   Zola Button MD 01/03/20

## 2020-01-04 ENCOUNTER — Telehealth: Payer: Self-pay | Admitting: Oncology

## 2020-01-04 LAB — CUP PACEART REMOTE DEVICE CHECK
Battery Remaining Longevity: 46 mo
Battery Remaining Percentage: 54 %
Battery Voltage: 2.92 V
Brady Statistic AP VP Percent: 1 %
Brady Statistic AP VS Percent: 76 %
Brady Statistic AS VP Percent: 1 %
Brady Statistic AS VS Percent: 23 %
Brady Statistic RA Percent Paced: 38 %
Brady Statistic RV Percent Paced: 22 %
Date Time Interrogation Session: 20210405234612
HighPow Impedance: 60 Ohm
HighPow Impedance: 60 Ohm
Implantable Lead Implant Date: 20090708
Implantable Lead Implant Date: 20090708
Implantable Lead Location: 753859
Implantable Lead Location: 753860
Implantable Lead Model: 7122
Implantable Pulse Generator Implant Date: 20170106
Lead Channel Impedance Value: 330 Ohm
Lead Channel Impedance Value: 390 Ohm
Lead Channel Pacing Threshold Amplitude: 0.5 V
Lead Channel Pacing Threshold Amplitude: 1 V
Lead Channel Pacing Threshold Pulse Width: 0.5 ms
Lead Channel Pacing Threshold Pulse Width: 0.9 ms
Lead Channel Sensing Intrinsic Amplitude: 12 mV
Lead Channel Sensing Intrinsic Amplitude: 2.3 mV
Lead Channel Setting Pacing Amplitude: 2 V
Lead Channel Setting Pacing Amplitude: 2.5 V
Lead Channel Setting Pacing Pulse Width: 0.9 ms
Lead Channel Setting Sensing Sensitivity: 0.5 mV
Pulse Gen Serial Number: 7306179

## 2020-01-04 NOTE — Telephone Encounter (Signed)
Scheduled appt per 4/6 los.  Spoke with pt and he is aware of his scheduled appt dates and time.

## 2020-01-10 ENCOUNTER — Other Ambulatory Visit: Payer: Self-pay

## 2020-01-10 ENCOUNTER — Inpatient Hospital Stay: Payer: Medicare HMO

## 2020-01-10 VITALS — BP 120/71 | HR 94 | Temp 97.7°F | Resp 18

## 2020-01-10 DIAGNOSIS — D508 Other iron deficiency anemias: Secondary | ICD-10-CM | POA: Diagnosis not present

## 2020-01-10 DIAGNOSIS — D509 Iron deficiency anemia, unspecified: Secondary | ICD-10-CM

## 2020-01-10 MED ORDER — SODIUM CHLORIDE 0.9 % IV SOLN
510.0000 mg | Freq: Once | INTRAVENOUS | Status: AC
  Administered 2020-01-10: 510 mg via INTRAVENOUS
  Filled 2020-01-10: qty 510

## 2020-01-10 MED ORDER — SODIUM CHLORIDE 0.9 % IV SOLN
Freq: Once | INTRAVENOUS | Status: AC
  Filled 2020-01-10: qty 250

## 2020-01-10 NOTE — Patient Instructions (Signed)

## 2020-01-17 ENCOUNTER — Inpatient Hospital Stay: Payer: Medicare HMO

## 2020-01-17 ENCOUNTER — Other Ambulatory Visit: Payer: Self-pay

## 2020-01-17 VITALS — BP 111/73 | HR 68 | Temp 98.3°F | Resp 16 | Ht 73.0 in

## 2020-01-17 DIAGNOSIS — D508 Other iron deficiency anemias: Secondary | ICD-10-CM | POA: Diagnosis not present

## 2020-01-17 DIAGNOSIS — D509 Iron deficiency anemia, unspecified: Secondary | ICD-10-CM

## 2020-01-17 MED ORDER — SODIUM CHLORIDE 0.9 % IV SOLN
510.0000 mg | Freq: Once | INTRAVENOUS | Status: AC
  Administered 2020-01-17: 09:00:00 510 mg via INTRAVENOUS
  Filled 2020-01-17: qty 17

## 2020-01-17 MED ORDER — SODIUM CHLORIDE 0.9 % IV SOLN
Freq: Once | INTRAVENOUS | Status: AC
  Filled 2020-01-17: qty 250

## 2020-01-17 NOTE — Patient Instructions (Signed)

## 2020-02-02 ENCOUNTER — Other Ambulatory Visit: Payer: Self-pay

## 2020-02-02 ENCOUNTER — Inpatient Hospital Stay: Payer: Medicare HMO | Attending: Oncology

## 2020-02-02 VITALS — BP 123/72 | HR 84 | Temp 98.0°F | Resp 18

## 2020-02-02 DIAGNOSIS — E538 Deficiency of other specified B group vitamins: Secondary | ICD-10-CM | POA: Insufficient documentation

## 2020-02-02 DIAGNOSIS — K909 Intestinal malabsorption, unspecified: Secondary | ICD-10-CM | POA: Diagnosis not present

## 2020-02-02 DIAGNOSIS — D509 Iron deficiency anemia, unspecified: Secondary | ICD-10-CM

## 2020-02-02 MED ORDER — CYANOCOBALAMIN 1000 MCG/ML IJ SOLN
1000.0000 ug | Freq: Once | INTRAMUSCULAR | Status: AC
  Administered 2020-02-02: 1000 ug via INTRAMUSCULAR

## 2020-02-02 MED ORDER — CYANOCOBALAMIN 1000 MCG/ML IJ SOLN
INTRAMUSCULAR | Status: AC
  Filled 2020-02-02: qty 1

## 2020-02-02 NOTE — Patient Instructions (Signed)
Cyanocobalamin, Pyridoxine, and Folate What is this medicine? A multivitamin containing folic acid, vitamin B6, and vitamin B12. This medicine may be used for other purposes; ask your health care provider or pharmacist if you have questions. COMMON BRAND NAME(S): AllanFol RX, AllanTex, Av-Vite FB, B Complex with Folic Acid, ComBgen, FaBB, Folamin, Folastin, Folbalin, Folbee, Folbic, Folcaps, Folgard, Folgard RX, Folgard RX 2.2, Folplex, Folplex 2.2, Foltabs 800, Foltx, Homocysteine Formula, Niva-Fol, NuFol, TL Gard RX, Virt-Gard, Virt-Vite, Virt-Vite Forte, Vita-Respa What should I tell my health care provider before I take this medicine? They need to know if you have any of these conditions:  bleeding or clotting disorder  history of anemia of any type  other chronic health condition  an unusual or allergic reaction to vitamins, other medicines, foods, dyes, or preservatives  pregnant or trying to get pregnant  breast-feeding How should I use this medicine? Take by mouth with a glass of water. May take with food. Follow the directions on the prescription label. It is usually given once a day. Do not take your medicine more often than directed. Contact your pediatrician regarding the use of this medicine in children. Special care may be needed. Overdosage: If you think you have taken too much of this medicine contact a poison control center or emergency room at once. NOTE: This medicine is only for you. Do not share this medicine with others. What if I miss a dose? If you miss a dose, take it as soon as you can. If it is almost time for your next dose, take only that dose. Do not take double or extra doses. What may interact with this medicine?  levodopa This list may not describe all possible interactions. Give your health care provider a list of all the medicines, herbs, non-prescription drugs, or dietary supplements you use. Also tell them if you smoke, drink alcohol, or use illegal  drugs. Some items may interact with your medicine. What should I watch for while using this medicine? See your health care professional for regular checks on your progress. Remember that vitamin supplements do not replace the need for good nutrition from a balanced diet. What side effects may I notice from receiving this medicine? Side effects that you should report to your doctor or health care professional as soon as possible:  allergic reaction such as skin rash or difficulty breathing  vomiting Side effects that usually do not require medical attention (report to your doctor or health care professional if they continue or are bothersome):  nausea  stomach upset This list may not describe all possible side effects. Call your doctor for medical advice about side effects. You may report side effects to FDA at 1-800-FDA-1088. Where should I keep my medicine? Keep out of the reach of children. Most vitamins should be stored at controlled room temperature. Check your specific product directions. Protect from heat and moisture. Throw away any unused medicine after the expiration date. NOTE: This sheet is a summary. It may not cover all possible information. If you have questions about this medicine, talk to your doctor, pharmacist, or health care provider.  2020 Elsevier/Gold Standard (2007-11-06 00:59:55)  

## 2020-02-06 ENCOUNTER — Other Ambulatory Visit: Payer: Self-pay | Admitting: Internal Medicine

## 2020-03-05 ENCOUNTER — Inpatient Hospital Stay: Payer: Medicare HMO | Attending: Oncology

## 2020-03-05 DIAGNOSIS — E538 Deficiency of other specified B group vitamins: Secondary | ICD-10-CM | POA: Insufficient documentation

## 2020-03-05 DIAGNOSIS — K909 Intestinal malabsorption, unspecified: Secondary | ICD-10-CM | POA: Insufficient documentation

## 2020-03-06 ENCOUNTER — Telehealth: Payer: Self-pay | Admitting: Oncology

## 2020-03-06 NOTE — Telephone Encounter (Signed)
Called per 6/8 sch msg - unable to reach pt . Left message for patient to call back to reschedule appt.

## 2020-03-08 ENCOUNTER — Inpatient Hospital Stay: Payer: Medicare HMO

## 2020-03-08 ENCOUNTER — Other Ambulatory Visit: Payer: Self-pay

## 2020-03-08 VITALS — BP 125/70 | HR 78 | Temp 98.1°F | Resp 18

## 2020-03-08 DIAGNOSIS — E538 Deficiency of other specified B group vitamins: Secondary | ICD-10-CM | POA: Diagnosis not present

## 2020-03-08 DIAGNOSIS — K909 Intestinal malabsorption, unspecified: Secondary | ICD-10-CM | POA: Diagnosis present

## 2020-03-08 DIAGNOSIS — D509 Iron deficiency anemia, unspecified: Secondary | ICD-10-CM

## 2020-03-08 MED ORDER — CYANOCOBALAMIN 1000 MCG/ML IJ SOLN
1000.0000 ug | Freq: Once | INTRAMUSCULAR | Status: AC
  Administered 2020-03-08: 1000 ug via INTRAMUSCULAR

## 2020-03-15 ENCOUNTER — Telehealth: Payer: Self-pay

## 2020-03-15 NOTE — Telephone Encounter (Signed)
Patient called back regarding driving restrictions.  Reiterated it is DMV regulation, if therapy was appropriate than he cannot drive car for 6 months.  It will be up to MD if treatment was not appropriate.  Pt scheduled for OV with Dr. Ladona Ridgel on 6/30

## 2020-03-15 NOTE — Telephone Encounter (Signed)
Alert remote reviewed.    Known AF, on OAC.  AF burden is 38% of the time and is persistent.  Received ATP for what appears to be atrial fib with RVR.  Current medications include Carvedilol 12.5mg  BID, Xarelto for OAC.    Spoke with pt.  He was asymptomatic.  He reports he may have been mowing the yard at time of episode.  Pt currently denies any cardiac symptoms and confirms medication compliance.  Advised pt of DMV regulations, if treatment was appropriate, he cannot drive for 6 months following.  Will forward report to MD for review.   Pt overdue for clinic visit, sending to scheduling to contact pt for appt,.

## 2020-03-21 ENCOUNTER — Telehealth: Payer: Self-pay | Admitting: Internal Medicine

## 2020-03-21 NOTE — Telephone Encounter (Signed)
Agree. No change.  

## 2020-03-21 NOTE — Telephone Encounter (Signed)
New Message:   Pt wants to know if it will be alright for him to ride his lawnmower and cut his yard.?

## 2020-03-22 NOTE — Telephone Encounter (Signed)
Advised Pt ok to [employer].  Advised to stay well hydrated, avoid midday sun and take breaks.  Pt indicates understanding.  Follow up scheduled with GT for 03/28/20

## 2020-03-28 ENCOUNTER — Other Ambulatory Visit: Payer: Self-pay

## 2020-03-28 ENCOUNTER — Telehealth: Payer: Self-pay | Admitting: Internal Medicine

## 2020-03-28 ENCOUNTER — Ambulatory Visit (INDEPENDENT_AMBULATORY_CARE_PROVIDER_SITE_OTHER): Payer: Medicare HMO | Admitting: Internal Medicine

## 2020-03-28 VITALS — BP 148/72 | HR 101 | Ht 73.0 in | Wt 212.0 lb

## 2020-03-28 DIAGNOSIS — I10 Essential (primary) hypertension: Secondary | ICD-10-CM

## 2020-03-28 MED ORDER — CARVEDILOL 12.5 MG PO TABS
18.7500 mg | ORAL_TABLET | Freq: Two times a day (BID) | ORAL | 3 refills | Status: DC
Start: 2020-03-28 — End: 2020-09-12

## 2020-03-28 NOTE — Telephone Encounter (Signed)
Pt wants to know when can he drive     Please call [phone]   Thanks renee

## 2020-03-28 NOTE — Progress Notes (Signed)
HPI [name] returns today for followup. He is a pleasant 73yo man with chronic systolic heart failure, an ICM, persistent atrial fib and volume overload. He has RVR. He does not have palpitations or symptoms of his atrial fib. He has been non-compliant with his lasix. He denies dietary indiscretion with sodium. Allergies  Allergen Reactions  . Codeine Nausea And Vomiting     Current Outpatient Medications  Medication Sig Dispense Refill  . baclofen (LIORESAL) 20 MG tablet Take 40 mg by mouth at bedtime.     . carvedilol (COREG) 12.5 MG tablet TAKE 1 TABLET BY MOUTH TWICE DAILY WITH A MEAL 60 tablet 0  . furosemide (LASIX) 40 MG tablet Take 40 mg by mouth daily as needed for fluid.     [name] glimepiride (AMARYL) 4 MG tablet Take 4 mg by mouth daily with breakfast.    . linagliptin (TRADJENTA) 5 MG TABS tablet Take 5 mg by mouth daily after breakfast.     . meclizine (ANTIVERT) 25 MG tablet Take 25 mg by mouth 4 (four) times daily as needed. For dizziness    . montelukast (SINGULAIR) 10 MG tablet Take 10 mg by mouth at bedtime.     [name] oxyCODONE-acetaminophen (PERCOCET/ROXICET) 5-325 MG tablet Take a half tablet every 6 hours    . SENNA CO Take 1 tablet by mouth daily as needed (constipation).     [name] spironolactone (ALDACTONE) 25 MG tablet Take 1 tablet (25 mg total) by mouth every morning. 90 tablet 3  . XARELTO 20 MG TABS tablet TAKE 1 TABLET BY MOUTH DAILY WITH SUPPER 30 tablet 5   No current facility-administered medications for this visit.     Past Medical History:  Diagnosis Date  . Anemia    Multifactorial  - hx of B12 def and iron deficiency, [facility]  . Automatic implantable cardioverter-defibrillator in situ   . CAD (coronary artery disease)    a. STEMI s/p CABG 07/2007 (had preop cardiogenic shock, IABP, VDRF before surgery)  . Cardiomyopathy, ischemic    a. Chronic systolic CHF s/p St. Jude dual chamber ICD 03/2008.  . Carotid  stenosis    a. Prior carotid dz/ surgery. b. Carotid dopp 07/2011 - 0-39% bilaterally.;   c. Doppler 11/13: 40-59% RICA, 0-39% LICA  . Chronic renal insufficiency   . Diabetes mellitus   . GI bleed    Reported history  . Hx of CABG   . Hypertension    [place] [facility]   . Mitral regurgitation   . Myocardial infarction (HCC)   . Neuromuscular disorder (HCC)    R sided weakness   . Osteoarthritis   . PAF (paroxysmal atrial fibrillation) (HCC)    a. Poor Coumadin candidate due to hx of GI bleed.  . Sinus bradycardia   . Sleep apnea    study done, 10 yrs ago, unable to tolerate CPAP  . Splenomegaly   . Stroke [facility])    a. CVA 1990s - chronic pain in RUE after stroke.  [name] Systolic CHF, chronic (HCC)    Previously taken off lisinopril by primary doctor due to labs  . Tobacco abuse     ROS:   All systems reviewed and negative except as noted in the HPI.   Past Surgical History:  Procedure Laterality Date  . CARDIAC DEFIBRILLATOR PLACEMENT  04/05/2008   Implantation of a St. Jude dual-chamber defibrillator, Doylene Canning. Ladona Ridgel , MD  . CARDIAC  DEFIBRILLATOR PLACEMENT    . CHOLECYSTECTOMY N/A 02/19/2016   Procedure: LAPAROSCOPIC CHOLECYSTECTOMY;  Surgeon: Abigail Miyamoto, MD;  Location: [facility] OR;  Service: General;  Laterality: N/A;  . COLONOSCOPY  07/2007   Rourk: left-sided diverticulum. TI normal. No etiology for IDA.  [name] CORONARY ARTERY BYPASS GRAFT    . CORONARY ARTERY BYPASS GRAFT  08/29/2007   x3; Marilu Favre H. Cornelius Moras MD  . EP IMPLANTABLE DEVICE N/A 10/05/2015   Procedure: ICD Generator Changeout;  Surgeon: Marinus Maw, MD;  Location: [facility] INVASIVE CV LAB;  Service: Cardiovascular;  Laterality: N/A;  . ERCP N/A 02/18/2016   Procedure: ENDOSCOPIC RETROGRADE CHOLANGIOPANCREATOGRAPHY (ERCP);  Surgeon: Vida Rigger, MD;  Location: [facility] ENDOSCOPY;  Service: Endoscopy;  Laterality: N/A;  . ESOPHAGOGASTRODUODENOSCOPY  07/2007   Rourk: small hh, atrophic gastric mucosa, poor distensibility of  stomach ?extrinsic compression, CT showed mild to moderate splenomegaly. No etiology for IDA.  [name] JOINT REPLACEMENT  1990's   L knee  . LAPAROSCOPIC APPENDECTOMY N/A 09/14/2017   Procedure: APPENDECTOMY LAPAROSCOPIC;  Surgeon: Jimmye Norman, MD;  Location: [facility] OR;  Service: General;  Laterality: N/A;  . Percutaneous coronary intervention  01/10/2005   Charlies Constable, MD  . TONSILLECTOMY    . TOTAL KNEE ARTHROPLASTY Right 08/16/2013   Procedure: RIGHT TOTAL KNEE ARTHROPLASTY;  Surgeon: Kathryne Hitch, MD;  Location: [facility] OR;  Service: Orthopedics;  Laterality: Right;     Family History  Problem Relation Age of Onset  . Heart attack Mother        CVA, MI  . Diabetes Mother   . Heart attack Father        MI  . Diabetes Sister   . Coronary artery disease Brother        CABG     Social History   Socioeconomic History  . Marital status: Married    Spouse name: Not on file  . Number of children: Not on file  . Years of education: Not on file  . Highest education level: Not on file  Occupational History  . Occupation: Retired    Associate Professor: UNEMPLOYED  Tobacco Use  . Smoking status: Former Smoker    Packs/day: 1.00    Years: 15.00    Pack years: 15.00    Quit date: 09/29/1990    Years since quitting: 29.5  . Smokeless tobacco: Former Neurosurgeon    Quit date: 08/09/1988  Vaping Use  . Vaping Use: Never used  Substance and Sexual Activity  . Alcohol use: No  . Drug use: No  . Sexual activity: Not on file  Other Topics Concern  . Not on file  Social History Narrative   Lives in [place] with wife   Been on disability since his stroke in the 1990s   Not routinely exercising   Social Determinants of Health   Financial Resource Strain:   . Difficulty of Paying Living Expenses:   Food Insecurity:   . Worried About Programme researcher, broadcasting/film/video in the Last Year:   . Barista in the Last Year:   Transportation Needs:   . Freight forwarder (Medical):   [name] Lack of  Transportation (Non-Medical):   Physical Activity:   . Days of Exercise per Week:   . Minutes of Exercise per Session:   Stress:   . Feeling of Stress :   Social Connections:   . Frequency of Communication with Friends and Family:   . Frequency of Social Gatherings with Friends and Family:   .  Attends Religious Services:   . Active Member of Clubs or Organizations:   . Attends Banker Meetings:   [name] Marital Status:   Intimate Partner Violence:   . Fear of Current or Ex-Partner:   . Emotionally Abused:   [name] Physically Abused:   . Sexually Abused:      BP (!) 148/72   Pulse (!) 101   Ht 6\' 1"  (1.854 m)   Wt 212 lb (96.2 kg)   BMI 27.97 kg/m   Physical Exam:  Well appearing NAD HEENT: Unremarkable Neck:  No JVD, no thyromegally Lymphatics:  No adenopathy Back:  No CVA tenderness Lungs:  Clear with no wheezes HEART:  IRegular rate rhythm, no murmurs, no rubs, no clicks Abd:  soft, positive bowel sounds, no organomegally, no rebound, no guarding Ext:  2 plus pulses, 2+ woody edema, no cyanosis, no clubbing Skin:  No rashes no nodules Neuro:  CN II through XII intact, motor grossly intact  EKG - atrial fib with a RVR  DEVICE  Normal device function.  See PaceArt for details.   Assess/Plan: 1. Uncontrolled atrial fib - I have asked him to uptitrate his coreg to 18.375 bid 2. Chronic systolic heart failure - I have asked him to restart his lasix 3. ICD - we reprogrammed his device today as he almost received an ICD shock for rapid atrial fib. He was programmed VVIR. 4. Bleeding - he has not had any GI bleeding on Xarelto. Continue.  .D.

## 2020-03-28 NOTE — Telephone Encounter (Signed)
Pt notified that per Dr. Ladona Ridgel he may drive. Pt thankful for the call. And voiced understanding.

## 2020-03-28 NOTE — Patient Instructions (Signed)
Medication Instructions:  Your physician recommends that you continue on your current medications as directed. Please refer to the Current Medication list given to you today.  Increase Coreg to 18.75 mg ( 1 1/2 Tablets) Two Times Daily  Restart Lasix   *If you need a refill on your cardiac medications before your next appointment, please call your pharmacy*   Lab Work: NONE  If you have labs (blood work) drawn today and your tests are completely normal, you will receive your results only by: [name] MyChart Message (if you have MyChart) OR . A paper copy in the mail If you have any lab test that is abnormal or we need to change your treatment, we will call you to review the results.   Testing/Procedures: NONE   Follow-Up: At [facility], you and your health needs are our priority.  As part of our continuing mission to provide you with exceptional heart care, we have created designated Provider Care Teams.  These Care Teams include your primary Cardiologist (physician) and Advanced Practice Providers (APPs -  Physician Assistants and Nurse Practitioners) who all work together to provide you with the care you need, when you need it.  We recommend signing up for the patient portal called "MyChart".  Sign up information is provided on this After Visit Summary.  MyChart is used to connect with patients for Virtual Visits (Telemedicine).  Patients are able to view lab/test results, encounter notes, upcoming appointments, etc.  Non-urgent messages can be sent to your provider as well.   To learn more about what you can do with MyChart, go to [email or web address].    Your next appointment:   6 month(s)  The format for your next appointment:   In Person  Provider:   Lewayne Bunting, MD   Other Instructions Thank you for choosing [facility]!

## 2020-04-03 ENCOUNTER — Ambulatory Visit (INDEPENDENT_AMBULATORY_CARE_PROVIDER_SITE_OTHER): Payer: Medicare HMO | Admitting: *Deleted

## 2020-04-03 DIAGNOSIS — I639 Cerebral infarction, unspecified: Secondary | ICD-10-CM | POA: Diagnosis not present

## 2020-04-03 LAB — CUP PACEART REMOTE DEVICE CHECK
Battery Remaining Longevity: 47 mo
Battery Remaining Percentage: 50 %
Battery Voltage: 2.92 V
Brady Statistic RV Percent Paced: 7.1 %
Date Time Interrogation Session: 20210706033617
HighPow Impedance: 63 Ohm
HighPow Impedance: 63 Ohm
Implantable Lead Implant Date: 20090708
Implantable Lead Implant Date: 20090708
Implantable Lead Location: 753859
Implantable Lead Location: 753860
Implantable Lead Model: 7122
Implantable Pulse Generator Implant Date: 20170106
Lead Channel Impedance Value: 360 Ohm
Lead Channel Pacing Threshold Amplitude: 1 V
Lead Channel Pacing Threshold Pulse Width: 0.9 ms
Lead Channel Sensing Intrinsic Amplitude: 12 mV
Lead Channel Setting Pacing Amplitude: 2.5 V
Lead Channel Setting Pacing Pulse Width: 0.9 ms
Lead Channel Setting Sensing Sensitivity: 0.5 mV
Pulse Gen Serial Number: 7306179

## 2020-04-04 ENCOUNTER — Inpatient Hospital Stay: Payer: Medicare HMO | Attending: Oncology

## 2020-04-04 ENCOUNTER — Other Ambulatory Visit: Payer: Self-pay

## 2020-04-04 VITALS — BP 136/78 | HR 103 | Temp 97.6°F | Resp 18

## 2020-04-04 DIAGNOSIS — K909 Intestinal malabsorption, unspecified: Secondary | ICD-10-CM | POA: Insufficient documentation

## 2020-04-04 DIAGNOSIS — E538 Deficiency of other specified B group vitamins: Secondary | ICD-10-CM | POA: Insufficient documentation

## 2020-04-04 DIAGNOSIS — D509 Iron deficiency anemia, unspecified: Secondary | ICD-10-CM

## 2020-04-04 MED ORDER — CYANOCOBALAMIN 1000 MCG/ML IJ SOLN
INTRAMUSCULAR | Status: AC
  Filled 2020-04-04: qty 1

## 2020-04-04 MED ORDER — CYANOCOBALAMIN 1000 MCG/ML IJ SOLN
1000.0000 ug | Freq: Once | INTRAMUSCULAR | Status: AC
  Administered 2020-04-04: 1000 ug via INTRAMUSCULAR

## 2020-04-04 NOTE — Progress Notes (Signed)
Remote ICD transmission.   

## 2020-05-04 ENCOUNTER — Other Ambulatory Visit: Payer: Self-pay

## 2020-05-04 ENCOUNTER — Inpatient Hospital Stay: Payer: Medicare HMO | Attending: Oncology

## 2020-05-04 VITALS — BP 111/58 | HR 85 | Temp 97.7°F | Resp 18

## 2020-05-04 DIAGNOSIS — K909 Intestinal malabsorption, unspecified: Secondary | ICD-10-CM | POA: Insufficient documentation

## 2020-05-04 DIAGNOSIS — E538 Deficiency of other specified B group vitamins: Secondary | ICD-10-CM | POA: Diagnosis not present

## 2020-05-04 DIAGNOSIS — D509 Iron deficiency anemia, unspecified: Secondary | ICD-10-CM

## 2020-05-04 MED ORDER — CYANOCOBALAMIN 1000 MCG/ML IJ SOLN
INTRAMUSCULAR | Status: AC
  Filled 2020-05-04: qty 1

## 2020-05-04 MED ORDER — CYANOCOBALAMIN 1000 MCG/ML IJ SOLN
1000.0000 ug | Freq: Once | INTRAMUSCULAR | Status: AC
  Administered 2020-05-04: 1000 ug via INTRAMUSCULAR

## 2020-05-06 ENCOUNTER — Other Ambulatory Visit: Payer: Self-pay | Admitting: Internal Medicine

## 2020-05-07 NOTE — Telephone Encounter (Signed)
Age 74, weight 96kg, SCr 0.88 04/28/19, CrCl > 100. afib indication, last visit June 2021. Added message for pt's next office visit to recheck cbc and bmet. Added note to appt in Dec to recheck labs.

## 2020-06-05 ENCOUNTER — Other Ambulatory Visit: Payer: Self-pay

## 2020-06-05 ENCOUNTER — Inpatient Hospital Stay: Payer: Medicare HMO | Attending: Oncology

## 2020-06-05 VITALS — BP 115/65 | HR 86 | Temp 97.7°F | Resp 18

## 2020-06-05 DIAGNOSIS — E538 Deficiency of other specified B group vitamins: Secondary | ICD-10-CM | POA: Diagnosis not present

## 2020-06-05 DIAGNOSIS — D509 Iron deficiency anemia, unspecified: Secondary | ICD-10-CM

## 2020-06-05 MED ORDER — CYANOCOBALAMIN 1000 MCG/ML IJ SOLN
INTRAMUSCULAR | Status: AC
  Filled 2020-06-05: qty 1

## 2020-06-05 MED ORDER — CYANOCOBALAMIN 1000 MCG/ML IJ SOLN
1000.0000 ug | Freq: Once | INTRAMUSCULAR | Status: AC
  Administered 2020-06-05: 1000 ug via INTRAMUSCULAR

## 2020-07-02 ENCOUNTER — Ambulatory Visit (INDEPENDENT_AMBULATORY_CARE_PROVIDER_SITE_OTHER): Payer: Medicare HMO

## 2020-07-02 DIAGNOSIS — I255 Ischemic cardiomyopathy: Secondary | ICD-10-CM

## 2020-07-02 DIAGNOSIS — I5022 Chronic systolic (congestive) heart failure: Secondary | ICD-10-CM

## 2020-07-03 ENCOUNTER — Ambulatory Visit: Payer: Medicare HMO | Attending: Internal Medicine

## 2020-07-03 ENCOUNTER — Ambulatory Visit: Payer: Self-pay

## 2020-07-03 DIAGNOSIS — Z23 Encounter for immunization: Secondary | ICD-10-CM

## 2020-07-03 NOTE — Progress Notes (Signed)
   Covid-19 Vaccination Clinic  Name:  [name]    MRN: [id number] DOB: Jan 16, 1946  07/03/2020  [name] was observed post Covid-19 immunization for 15 minutes without incident. He was provided with Vaccine Information Sheet and instruction to access the V-Safe system.   [name] was instructed to call 911 with any severe reactions post vaccine: [name] Difficulty breathing  . Swelling of face and throat  . A fast heartbeat  . A bad rash all over body  . Dizziness and weakness

## 2020-07-04 LAB — CUP PACEART REMOTE DEVICE CHECK
Battery Remaining Longevity: 46 mo
Battery Remaining Percentage: 49 %
Battery Voltage: 2.9 V
Brady Statistic RV Percent Paced: 7.5 %
Date Time Interrogation Session: 20211006015017
HighPow Impedance: 66 Ohm
HighPow Impedance: 66 Ohm
Implantable Lead Implant Date: 20090708
Implantable Lead Implant Date: 20090708
Implantable Lead Location: 753859
Implantable Lead Location: 753860
Implantable Lead Model: 7122
Implantable Pulse Generator Implant Date: 20170106
Lead Channel Impedance Value: 350 Ohm
Lead Channel Pacing Threshold Amplitude: 1 V
Lead Channel Pacing Threshold Pulse Width: 0.9 ms
Lead Channel Sensing Intrinsic Amplitude: 12 mV
Lead Channel Setting Pacing Amplitude: 2.5 V
Lead Channel Setting Pacing Pulse Width: 0.9 ms
Lead Channel Setting Sensing Sensitivity: 0.5 mV
Pulse Gen Serial Number: 7306179

## 2020-07-04 NOTE — Progress Notes (Signed)
Remote ICD transmission.   

## 2020-07-05 ENCOUNTER — Inpatient Hospital Stay: Payer: Medicare HMO

## 2020-07-05 ENCOUNTER — Other Ambulatory Visit: Payer: Self-pay

## 2020-07-05 ENCOUNTER — Inpatient Hospital Stay: Payer: Medicare HMO | Admitting: Oncology

## 2020-07-05 ENCOUNTER — Inpatient Hospital Stay: Payer: Medicare HMO | Attending: Oncology

## 2020-07-05 VITALS — BP 126/78 | HR 99 | Temp 97.7°F | Resp 18 | Ht 73.0 in | Wt 211.6 lb

## 2020-07-05 DIAGNOSIS — D509 Iron deficiency anemia, unspecified: Secondary | ICD-10-CM

## 2020-07-05 DIAGNOSIS — E538 Deficiency of other specified B group vitamins: Secondary | ICD-10-CM | POA: Diagnosis not present

## 2020-07-05 LAB — CBC WITH DIFFERENTIAL (CANCER CENTER ONLY)
Abs Immature Granulocytes: 0.02 10*3/uL (ref 0.00–0.07)
Basophils Absolute: 0 10*3/uL (ref 0.0–0.1)
Basophils Relative: 0 %
Eosinophils Absolute: 0.2 10*3/uL (ref 0.0–0.5)
Eosinophils Relative: 3 %
HCT: 43.2 % (ref 39.0–52.0)
Hemoglobin: 14.5 g/dL (ref 13.0–17.0)
Immature Granulocytes: 0 %
Lymphocytes Relative: 14 %
Lymphs Abs: 1 10*3/uL (ref 0.7–4.0)
MCH: 31.3 pg (ref 26.0–34.0)
MCHC: 33.6 g/dL (ref 30.0–36.0)
MCV: 93.1 fL (ref 80.0–100.0)
Monocytes Absolute: 1.1 10*3/uL — ABNORMAL HIGH (ref 0.1–1.0)
Monocytes Relative: 16 %
Neutro Abs: 4.6 10*3/uL (ref 1.7–7.7)
Neutrophils Relative %: 67 %
Platelet Count: 157 10*3/uL (ref 150–400)
RBC: 4.64 MIL/uL (ref 4.22–5.81)
RDW: 14.8 % (ref 11.5–15.5)
WBC Count: 6.9 10*3/uL (ref 4.0–10.5)
nRBC: 0 % (ref 0.0–0.2)

## 2020-07-05 LAB — IRON AND TIBC
Iron: 30 ug/dL — ABNORMAL LOW (ref 42–163)
Saturation Ratios: 10 % — ABNORMAL LOW (ref 20–55)
TIBC: 300 ug/dL (ref 202–409)
UIBC: 270 ug/dL (ref 117–376)

## 2020-07-05 LAB — FERRITIN: Ferritin: 115 ng/mL (ref 24–336)

## 2020-07-05 MED ORDER — CYANOCOBALAMIN 1000 MCG/ML IJ SOLN
1000.0000 ug | Freq: Once | INTRAMUSCULAR | Status: AC
  Administered 2020-07-05: 1000 ug via INTRAMUSCULAR

## 2020-07-05 MED ORDER — CYANOCOBALAMIN 1000 MCG/ML IJ SOLN
INTRAMUSCULAR | Status: AC
  Filled 2020-07-05: qty 1

## 2020-07-05 NOTE — Progress Notes (Signed)
[facility] HEMATOLOGY OFFICE PROGRESS NOTE 07/05/20    DIAGNOSIS: 74 year old man with anemia diagnosed in 2008.  He has iron deficiency anemia in addition to B12 deficiency secondary to poor absorption.    CURRENT THERAPY:    B-12 1000 mcg IM injection on a monthly basis.  Feraheme infusion as needed.  Last infusion given in April 2021.  INTERVAL HISTORY:  [name] is here for a follow-up evaluation.  Since the last visit, he reports no major changes in his health.  He has tolerated IV iron infusion in April with improvement in his overall performance status and mobility.  He denies any shortness of breath or difficulty breathing.  He denies any dyspnea on exertion.  He denies any complications related to B12 injections.      MEDICATIONS: Updated on review.  Current Outpatient Medications  Medication Sig Dispense Refill  . baclofen (LIORESAL) 20 MG tablet Take 40 mg by mouth at bedtime.     . carvedilol (COREG) 12.5 MG tablet Take 1.5 tablets (18.75 mg total) by mouth 2 (two) times daily. 270 tablet 3  . furosemide (LASIX) 40 MG tablet Take 40 mg by mouth daily as needed for fluid.     [name] glimepiride (AMARYL) 4 MG tablet Take 4 mg by mouth daily with breakfast.    . linagliptin (TRADJENTA) 5 MG TABS tablet Take 5 mg by mouth daily after breakfast.     . meclizine (ANTIVERT) 25 MG tablet Take 25 mg by mouth 4 (four) times daily as needed. For dizziness    . montelukast (SINGULAIR) 10 MG tablet Take 10 mg by mouth at bedtime.     [name] oxyCODONE-acetaminophen (PERCOCET/ROXICET) 5-325 MG tablet Take a half tablet every 6 hours    . SENNA CO Take 1 tablet by mouth daily as needed (constipation).     [name] spironolactone (ALDACTONE) 25 MG tablet Take 1 tablet (25 mg total) by mouth every morning. 90 tablet 3  . XARELTO 20 MG TABS tablet TAKE 1 TABLET BY MOUTH ONCE DAILY WITH SUPPER 30 tablet 5   No current facility-administered medications for this visit.     PHYSICAL  EXAMINATION:  Blood pressure 126/78, pulse 99, temperature 97.7 F (36.5 C), temperature source Tympanic, resp. rate 18, height 6\' 1"  (1.854 m), weight 211 lb 9.6 oz (96 kg), SpO2 98 %.   ECOG PERFORMANCE STATUS: 1    General appearance: Comfortable appearing without any discomfort Head: Normocephalic without any trauma Oropharynx: Mucous membranes are moist and pink without any thrush or ulcers. Eyes: Pupils are equal and round reactive to light. Lymph nodes: No cervical, supraclavicular, inguinal or axillary lymphadenopathy.   Heart:regular rate and rhythm.  S1 and S2 without leg edema. Lung: Clear without any rhonchi or wheezes.  No dullness to percussion. Abdomin: Soft, nontender, nondistended with good bowel sounds.  No hepatosplenomegaly. Musculoskeletal: No joint deformity or effusion.  Full range of motion noted. Neurological: No deficits noted on motor, sensory and deep tendon reflex exam. Skin: No petechial rash or dryness.  Appeared moist.        Labs:  Lab Results  Component Value Date   WBC 7.7 01/03/2020   HGB 12.9 (L) 01/03/2020   HCT 41.5 01/03/2020   MCV 87.2 01/03/2020   PLT 197 01/03/2020   NEUTROABS 5.2 01/03/2020      Chemistry      Component Value Date/Time   NA 142 04/28/2019 0602   NA 141 06/02/2017 0952   K 4.0  04/28/2019 0602   K 4.0 06/02/2017 0952   CL 107 04/28/2019 0602   CL 100 01/06/2013 0830   CO2 28 04/28/2019 0602   CO2 28 06/02/2017 0952   BUN 13 04/28/2019 0602   BUN 15.7 06/02/2017 0952   CREATININE 0.88 04/28/2019 0602   CREATININE 0.9 06/02/2017 0952      Component Value Date/Time   CALCIUM 8.6 (L) 04/28/2019 0602   CALCIUM 9.4 06/02/2017 0952   ALKPHOS 98 04/27/2019 0153   ALKPHOS 81 06/02/2017 0952   AST 15 04/27/2019 0153   AST 13 06/02/2017 0952   ALT 11 04/27/2019 0153   ALT 9 06/02/2017 0952   BILITOT 0.5 04/27/2019 0153   BILITOT 0.46 06/02/2017 0952        Assessment and plan:    74 year old man  with:   1.  B12 deficiency causing anemia diagnosed in 2008.  Etiology is related to poor absorption.  He continues to receive monthly B12 injection without any major complications.  His hemoglobin continues to be normal at this time currently at 14.5.  Risks and benefits of continuing this approach were discussed.  He is agreeable to continue at this time.    2. Iron deficiency: Iron levels are currently pending from today showed borderline decrease in the past.  He did receive intravenous iron in April 2021 which is reflected by normalization of his hemoglobin.  I recommend continued monitoring at this time with repeat every 6 months.  Repeat IV iron may be needed in the future.   3.  Covid vaccination considerations: He is up-to-date completed his booster vaccination recently.  4. Follow-up: He will continue to receive monthly B12 injections and MD follow-up in 6 months.  30  minutes were spent on this encounter.  The time was dedicated to reviewing his disease status, discussing laboratory data and treatment plan for the future.   [name] Hose MD 07/05/20

## 2020-07-05 NOTE — Patient Instructions (Signed)
Cyanocobalamin, Pyridoxine, and Folate What is this medicine? A multivitamin containing folic acid, vitamin B6, and vitamin B12. This medicine may be used for other purposes; ask your health care provider or pharmacist if you have questions. COMMON BRAND NAME(S): AllanFol RX, AllanTex, Av-Vite FB, B Complex with Folic Acid, ComBgen, FaBB, Folamin, Folastin, Folbalin, Folbee, Folbic, Folcaps, Folgard, Folgard RX, Folgard RX 2.2, Folplex, Folplex 2.2, Foltabs 800, Foltx, Homocysteine Formula, Niva-Fol, NuFol, TL Gard RX, Virt-Gard, Virt-Vite, Virt-Vite Forte, Vita-Respa What should I tell my health care provider before I take this medicine? They need to know if you have any of these conditions:  bleeding or clotting disorder  history of anemia of any type  other chronic health condition  an unusual or allergic reaction to vitamins, other medicines, foods, dyes, or preservatives  pregnant or trying to get pregnant  breast-feeding How should I use this medicine? Take by mouth with a glass of water. May take with food. Follow the directions on the prescription label. It is usually given once a day. Do not take your medicine more often than directed. Contact your pediatrician regarding the use of this medicine in children. Special care may be needed. Overdosage: If you think you have taken too much of this medicine contact a poison control center or emergency room at once. NOTE: This medicine is only for you. Do not share this medicine with others. What if I miss a dose? If you miss a dose, take it as soon as you can. If it is almost time for your next dose, take only that dose. Do not take double or extra doses. What may interact with this medicine?  levodopa This list may not describe all possible interactions. Give your health care provider a list of all the medicines, herbs, non-prescription drugs, or dietary supplements you use. Also tell them if you smoke, drink alcohol, or use illegal  drugs. Some items may interact with your medicine. What should I watch for while using this medicine? See your health care professional for regular checks on your progress. Remember that vitamin supplements do not replace the need for good nutrition from a balanced diet. What side effects may I notice from receiving this medicine? Side effects that you should report to your doctor or health care professional as soon as possible:  allergic reaction such as skin rash or difficulty breathing  vomiting Side effects that usually do not require medical attention (report to your doctor or health care professional if they continue or are bothersome):  nausea  stomach upset This list may not describe all possible side effects. Call your doctor for medical advice about side effects. You may report side effects to FDA at 1-800-FDA-1088. Where should I keep my medicine? Keep out of the reach of children. Most vitamins should be stored at controlled room temperature. Check your specific product directions. Protect from heat and moisture. Throw away any unused medicine after the expiration date. NOTE: This sheet is a summary. It may not cover all possible information. If you have questions about this medicine, talk to your doctor, pharmacist, or health care provider.  2020 Elsevier/Gold Standard (2007-11-06 00:59:55)  

## 2020-08-07 ENCOUNTER — Telehealth: Payer: Self-pay | Admitting: Oncology

## 2020-08-07 NOTE — Telephone Encounter (Signed)
Scheduled per los, patient has been called and notified. 

## 2020-08-10 ENCOUNTER — Inpatient Hospital Stay: Payer: Medicare HMO | Attending: Oncology

## 2020-08-10 ENCOUNTER — Other Ambulatory Visit: Payer: Self-pay

## 2020-08-10 VITALS — BP 117/75 | HR 85 | Temp 98.0°F | Resp 18

## 2020-08-10 DIAGNOSIS — D509 Iron deficiency anemia, unspecified: Secondary | ICD-10-CM

## 2020-08-10 DIAGNOSIS — E538 Deficiency of other specified B group vitamins: Secondary | ICD-10-CM | POA: Diagnosis not present

## 2020-08-10 MED ORDER — CYANOCOBALAMIN 1000 MCG/ML IJ SOLN
INTRAMUSCULAR | Status: AC
  Filled 2020-08-10: qty 1

## 2020-08-10 MED ORDER — CYANOCOBALAMIN 1000 MCG/ML IJ SOLN
1000.0000 ug | Freq: Once | INTRAMUSCULAR | Status: AC
  Administered 2020-08-10: 1000 ug via INTRAMUSCULAR

## 2020-09-07 ENCOUNTER — Inpatient Hospital Stay: Payer: Medicare HMO | Attending: Oncology

## 2020-09-07 ENCOUNTER — Other Ambulatory Visit: Payer: Self-pay

## 2020-09-07 VITALS — BP 124/67 | HR 69 | Resp 18

## 2020-09-07 DIAGNOSIS — D509 Iron deficiency anemia, unspecified: Secondary | ICD-10-CM

## 2020-09-07 DIAGNOSIS — E538 Deficiency of other specified B group vitamins: Secondary | ICD-10-CM | POA: Insufficient documentation

## 2020-09-07 MED ORDER — CYANOCOBALAMIN 1000 MCG/ML IJ SOLN
1000.0000 ug | Freq: Once | INTRAMUSCULAR | Status: AC
  Administered 2020-09-07: 1000 ug via INTRAMUSCULAR

## 2020-09-07 MED ORDER — CYANOCOBALAMIN 1000 MCG/ML IJ SOLN
INTRAMUSCULAR | Status: AC
  Filled 2020-09-07: qty 1

## 2020-09-07 NOTE — Patient Instructions (Signed)
Cyanocobalamin, Pyridoxine, and Folate What is this medicine? A multivitamin containing folic acid, vitamin B6, and vitamin B12. This medicine may be used for other purposes; ask your health care provider or pharmacist if you have questions. COMMON BRAND NAME(S): AllanFol RX, AllanTex, Av-Vite FB, B Complex with Folic Acid, ComBgen, FaBB, Folamin, Folastin, Folbalin, Folbee, Folbic, Folcaps, Folgard, Folgard RX, Folgard RX 2.2, Folplex, Folplex 2.2, Foltabs 800, Foltx, Homocysteine Formula, Niva-Fol, NuFol, TL Gard RX, Virt-Gard, Virt-Vite, Virt-Vite Forte, Vita-Respa What should I tell my health care provider before I take this medicine? They need to know if you have any of these conditions:  bleeding or clotting disorder  history of anemia of any type  other chronic health condition  an unusual or allergic reaction to vitamins, other medicines, foods, dyes, or preservatives  pregnant or trying to get pregnant  breast-feeding How should I use this medicine? Take by mouth with a glass of water. May take with food. Follow the directions on the prescription label. It is usually given once a day. Do not take your medicine more often than directed. Contact your pediatrician regarding the use of this medicine in children. Special care may be needed. Overdosage: If you think you have taken too much of this medicine contact a poison control center or emergency room at once. NOTE: This medicine is only for you. Do not share this medicine with others. What if I miss a dose? If you miss a dose, take it as soon as you can. If it is almost time for your next dose, take only that dose. Do not take double or extra doses. What may interact with this medicine?  levodopa This list may not describe all possible interactions. Give your health care provider a list of all the medicines, herbs, non-prescription drugs, or dietary supplements you use. Also tell them if you smoke, drink alcohol, or use illegal  drugs. Some items may interact with your medicine. What should I watch for while using this medicine? See your health care professional for regular checks on your progress. Remember that vitamin supplements do not replace the need for good nutrition from a balanced diet. What side effects may I notice from receiving this medicine? Side effects that you should report to your doctor or health care professional as soon as possible:  allergic reaction such as skin rash or difficulty breathing  vomiting Side effects that usually do not require medical attention (report to your doctor or health care professional if they continue or are bothersome):  nausea  stomach upset This list may not describe all possible side effects. Call your doctor for medical advice about side effects. You may report side effects to FDA at 1-800-FDA-1088. Where should I keep my medicine? Keep out of the reach of children. Most vitamins should be stored at controlled room temperature. Check your specific product directions. Protect from heat and moisture. Throw away any unused medicine after the expiration date. NOTE: This sheet is a summary. It may not cover all possible information. If you have questions about this medicine, talk to your doctor, pharmacist, or health care provider.  2020 Elsevier/Gold Standard (2007-11-06 00:59:55)  

## 2020-09-11 ENCOUNTER — Telehealth: Payer: Self-pay | Admitting: Oncology

## 2020-09-11 NOTE — Telephone Encounter (Signed)
Rescheduled appointments per 12/14 call received from patient. Patient is aware of appointments updated times and dates. I will mail updated calendar to patient per request.

## 2020-09-12 ENCOUNTER — Other Ambulatory Visit: Payer: Self-pay

## 2020-09-12 ENCOUNTER — Ambulatory Visit: Payer: Medicare HMO | Admitting: Internal Medicine

## 2020-09-12 ENCOUNTER — Encounter: Payer: Self-pay | Admitting: Internal Medicine

## 2020-09-12 VITALS — BP 136/80 | HR 84 | Ht 74.0 in | Wt 215.0 lb

## 2020-09-12 DIAGNOSIS — I255 Ischemic cardiomyopathy: Secondary | ICD-10-CM

## 2020-09-12 DIAGNOSIS — I4819 Other persistent atrial fibrillation: Secondary | ICD-10-CM | POA: Diagnosis not present

## 2020-09-12 DIAGNOSIS — I5022 Chronic systolic (congestive) heart failure: Secondary | ICD-10-CM

## 2020-09-12 LAB — CUP PACEART INCLINIC DEVICE CHECK
Battery Remaining Longevity: 48 mo
Brady Statistic RA Percent Paced: 0 %
Brady Statistic RV Percent Paced: 7.5 %
Date Time Interrogation Session: 20211215083643
HighPow Impedance: 56.25 Ohm
Implantable Lead Implant Date: 20090708
Implantable Lead Implant Date: 20090708
Implantable Lead Location: 753859
Implantable Lead Location: 753860
Implantable Lead Model: 7122
Implantable Pulse Generator Implant Date: 20170106
Lead Channel Impedance Value: 325 Ohm
Lead Channel Impedance Value: 387.5 Ohm
Lead Channel Pacing Threshold Amplitude: 0.75 V
Lead Channel Pacing Threshold Pulse Width: 0.9 ms
Lead Channel Sensing Intrinsic Amplitude: 12 mV
Lead Channel Sensing Intrinsic Amplitude: 2.9 mV
Lead Channel Setting Pacing Amplitude: 2.5 V
Lead Channel Setting Pacing Pulse Width: 0.9 ms
Lead Channel Setting Sensing Sensitivity: 0.5 mV
Pulse Gen Serial Number: 7306179

## 2020-09-12 MED ORDER — CARVEDILOL 25 MG PO TABS: 25.0000 mg | ORAL_TABLET | Freq: Two times a day (BID) | ORAL | 3 refills | Status: AC

## 2020-09-12 NOTE — Patient Instructions (Signed)
Medication Instructions:  Your physician recommends that you continue on your current medications as directed. Please refer to the Current Medication list given to you today.  Increase Coreg to 25 mg Two Times Daily   *If you need a refill on your cardiac medications before your next appointment, please call your pharmacy*   Lab Work: NONE   If you have labs (blood work) drawn today and your tests are completely normal, you will receive your results only by: [name] MyChart Message (if you have MyChart) OR . A paper copy in the mail If you have any lab test that is abnormal or we need to change your treatment, we will call you to review the results.   Testing/Procedures: NONE   Follow-Up: At [facility], you and your health needs are our priority.  As part of our continuing mission to provide you with exceptional heart care, we have created designated Provider Care Teams.  These Care Teams include your primary Cardiologist (physician) and Advanced Practice Providers (APPs -  Physician Assistants and Nurse Practitioners) who all work together to provide you with the care you need, when you need it.  We recommend signing up for the patient portal called "MyChart".  Sign up information is provided on this After Visit Summary.  MyChart is used to connect with patients for Virtual Visits (Telemedicine).  Patients are able to view lab/test results, encounter notes, upcoming appointments, etc.  Non-urgent messages can be sent to your provider as well.   To learn more about what you can do with MyChart, go to [email or web address].    Your next appointment:   1 year(s)  The format for your next appointment:   In Person  Provider:   Lewayne Bunting, MD   Other Instructions Thank you for choosing [facility]!

## 2020-09-12 NOTE — Progress Notes (Signed)
HPI Mr. Okazaki returns today for followup. He is a pleasant 74 yo man with a h/o chronic systolic heart failure, poorly controlled atrial fib, h/o GI bleeding on Xarelto, and s/p ICD. I saw him 6 months ago and we recommended uptitration of his coreg. In the interim, he notes he has done well. His Xarelto was restarted. He denies chest pain. He has mild peripheral edema.  Allergies  Allergen Reactions  . Codeine Nausea And Vomiting     Current Outpatient Medications  Medication Sig Dispense Refill  . baclofen (LIORESAL) 20 MG tablet Take 40 mg by mouth at bedtime.     . carvedilol (COREG) 12.5 MG tablet Take 1.5 tablets (18.75 mg total) by mouth 2 (two) times daily. 270 tablet 3  . furosemide (LASIX) 40 MG tablet Take 40 mg by mouth daily as needed for fluid.     [name] glimepiride (AMARYL) 4 MG tablet Take 4 mg by mouth daily with breakfast.    . linagliptin (TRADJENTA) 5 MG TABS tablet Take 5 mg by mouth daily after breakfast.     . meclizine (ANTIVERT) 25 MG tablet Take 25 mg by mouth 4 (four) times daily as needed. For dizziness    . oxyCODONE-acetaminophen (PERCOCET/ROXICET) 5-325 MG tablet Take a half tablet every 6 hours    . spironolactone (ALDACTONE) 25 MG tablet Take 1 tablet (25 mg total) by mouth every morning. 90 tablet 3  . XARELTO 20 MG TABS tablet TAKE 1 TABLET BY MOUTH ONCE DAILY WITH SUPPER 30 tablet 5  . atorvastatin (LIPITOR) 40 MG tablet     . metFORMIN (GLUCOPHAGE) 500 MG tablet     . SOLIQUA 100-33 UNT-MCG/ML SOPN      No current facility-administered medications for this visit.     Past Medical History:  Diagnosis Date  . Anemia    Multifactorial  - hx of B12 def and iron deficiency, [facility]  . Automatic implantable cardioverter-defibrillator in situ   . CAD (coronary artery disease)    a. STEMI s/p CABG 07/2007 (had preop cardiogenic shock, IABP, VDRF before surgery)  . Cardiomyopathy, ischemic    a. Chronic  systolic CHF s/p St. Jude dual chamber ICD 03/2008.  . Carotid stenosis    a. Prior carotid dz/ surgery. b. Carotid dopp 07/2011 - 0-39% bilaterally.;   c. Doppler 11/13: 40-59% RICA, 0-39% LICA  . Chronic renal insufficiency   . Diabetes mellitus   . GI bleed    Reported history  . Hx of CABG   . Hypertension    [place] [facility]   . Mitral regurgitation   . Myocardial infarction (HCC)   . Neuromuscular disorder (HCC)    R sided weakness   . Osteoarthritis   . PAF (paroxysmal atrial fibrillation) (HCC)    a. Poor Coumadin candidate due to hx of GI bleed.  . Sinus bradycardia   . Sleep apnea    study done, 10 yrs ago, unable to tolerate CPAP  . Splenomegaly   . Stroke [facility])    a. CVA 1990s - chronic pain in RUE after stroke.  [name] Systolic CHF, chronic (HCC)    Previously taken off lisinopril by primary doctor due to labs  . Tobacco abuse     ROS:   All systems reviewed and negative except as noted in the HPI.   Past Surgical History:  Procedure Laterality Date  . CARDIAC DEFIBRILLATOR PLACEMENT  04/05/2008  Implantation of a St. Jude dual-chamber defibrillator, Doylene Canning. Ladona Ridgel , MD  . CARDIAC DEFIBRILLATOR PLACEMENT    . CHOLECYSTECTOMY N/A 02/19/2016   Procedure: LAPAROSCOPIC CHOLECYSTECTOMY;  Surgeon: Abigail Miyamoto, MD;  Location: [facility] OR;  Service: General;  Laterality: N/A;  . COLONOSCOPY  07/2007   Rourk: left-sided diverticulum. TI normal. No etiology for IDA.  [name] CORONARY ARTERY BYPASS GRAFT    . CORONARY ARTERY BYPASS GRAFT  08/29/2007   x3; Marilu Favre H. Cornelius Moras MD  . EP IMPLANTABLE DEVICE N/A 10/05/2015   Procedure: ICD Generator Changeout;  Surgeon: Marinus Maw, MD;  Location: [facility] INVASIVE CV LAB;  Service: Cardiovascular;  Laterality: N/A;  . ERCP N/A 02/18/2016   Procedure: ENDOSCOPIC RETROGRADE CHOLANGIOPANCREATOGRAPHY (ERCP);  Surgeon: Vida Rigger, MD;  Location[facility] ENDOSCOPY;  Service: Endoscopy;  Laterality: N/A;  . ESOPHAGOGASTRODUODENOSCOPY  07/2007    Rourk: small hh, atrophic gastric mucosa, poor distensibility of stomach ?extrinsic compression, CT showed mild to moderate splenomegaly. No etiology for IDA.  [name] JOINT REPLACEMENT  1990's   L knee  . LAPAROSCOPIC APPENDECTOMY N/A 09/14/2017   Procedure: APPENDECTOMY LAPAROSCOPIC;  Surgeon: Jimmye Norman, MD;  Location: [facility] OR;  Service: General;  Laterality: N/A;  . Percutaneous coronary intervention  01/10/2005   Charlies Constable, MD  . TONSILLECTOMY    . TOTAL KNEE ARTHROPLASTY Right 08/16/2013   Procedure: RIGHT TOTAL KNEE ARTHROPLASTY;  Surgeon: Kathryne Hitch, MD;  Location: [facility] OR;  Service: Orthopedics;  Laterality: Right;     Family History  Problem Relation Age of Onset  . Heart attack Mother        CVA, MI  . Diabetes Mother   . Heart attack Father        MI  . Diabetes Sister   . Coronary artery disease Brother        CABG     Social History   Socioeconomic History  . Marital status: Married    Spouse name: Not on file  . Number of children: Not on file  . Years of education: Not on file  . Highest education level: Not on file  Occupational History  . Occupation: Retired    Associate Professor: UNEMPLOYED  Tobacco Use  . Smoking status: Former Smoker    Packs/day: 1.00    Years: 15.00    Pack years: 15.00    Quit date: 09/29/1990    Years since quitting: 29.9  . Smokeless tobacco: Former Neurosurgeon    Quit date: 08/09/1988  Vaping Use  . Vaping Use: Never used  Substance and Sexual Activity  . Alcohol use: No  . Drug use: No  . Sexual activity: Not on file  Other Topics Concern  . Not on file  Social History Narrative   Lives in [place] with wife   Been on disability since his stroke in the 1990s   Not routinely exercising   Social Determinants of Health   Financial Resource Strain: Not on file  Food Insecurity: Not on file  Transportation Needs: Not on file  Physical Activity: Not on file  Stress: Not on file  Social Connections: Not on file  Intimate  Partner Violence: Not on file     Ht 6\' 2"  (1.88 m)   Wt 215 lb (97.5 kg)   BMI 27.60 kg/m   Physical Exam:  Well appearing NAD HEENT: Unremarkable Neck:  No JVD, no thyromegally Lymphatics:  No adenopathy Back:  No CVA tenderness Lungs:  Clear with no wheezes HEART:  IRegular rate  rhythm, no murmurs, no rubs, no clicks Abd:  soft, positive bowel sounds, no organomegally, no rebound, no guarding Ext:  2 plus pulses, 1+ edema, no cyanosis, no clubbing Skin:  No rashes no nodules Neuro:  CN II through XII intact, motor grossly intact   DEVICE  Normal device function.  See PaceArt for details.   Assess/Plan: 1. Chronic systolic heart failure - his symptoms are class 2A. He is still working. He will continue his current meds and maintain a low sodium diet. 2. Atrial fib - his VR is improved but still not ideal. I have asked him to increase the coreg to 25 mg twice daily. 3. Coags - he has started back xarelto. He has not had any GI bleeding.  4. HTN - his bp is minimally elevated. Hopefully the bp will improve.  Sharlot Gowda Mykell Rawl,MD

## 2020-10-02 ENCOUNTER — Ambulatory Visit (INDEPENDENT_AMBULATORY_CARE_PROVIDER_SITE_OTHER): Payer: Medicare HMO

## 2020-10-02 DIAGNOSIS — I255 Ischemic cardiomyopathy: Secondary | ICD-10-CM | POA: Diagnosis not present

## 2020-10-02 LAB — CUP PACEART REMOTE DEVICE CHECK
Battery Remaining Longevity: 42 mo
Battery Remaining Percentage: 46 %
Battery Voltage: 2.9 V
Brady Statistic RV Percent Paced: 23 %
Date Time Interrogation Session: 20220104054718
HighPow Impedance: 57 Ohm
HighPow Impedance: 57 Ohm
Implantable Lead Implant Date: 20090708
Implantable Lead Implant Date: 20090708
Implantable Lead Location: 753859
Implantable Lead Location: 753860
Implantable Lead Model: 7122
Implantable Pulse Generator Implant Date: 20170106
Lead Channel Impedance Value: 360 Ohm
Lead Channel Pacing Threshold Amplitude: 0.75 V
Lead Channel Pacing Threshold Pulse Width: 0.9 ms
Lead Channel Sensing Intrinsic Amplitude: 12 mV
Lead Channel Setting Pacing Amplitude: 2.5 V
Lead Channel Setting Pacing Pulse Width: 0.9 ms
Lead Channel Setting Sensing Sensitivity: 0.5 mV
Pulse Gen Serial Number: 7306179

## 2020-10-05 ENCOUNTER — Inpatient Hospital Stay: Payer: Medicare HMO | Attending: Oncology

## 2020-10-05 ENCOUNTER — Ambulatory Visit: Payer: Medicare HMO

## 2020-10-05 ENCOUNTER — Other Ambulatory Visit: Payer: Self-pay

## 2020-10-05 VITALS — BP 119/70 | HR 65 | Resp 16

## 2020-10-05 DIAGNOSIS — D509 Iron deficiency anemia, unspecified: Secondary | ICD-10-CM

## 2020-10-05 DIAGNOSIS — E538 Deficiency of other specified B group vitamins: Secondary | ICD-10-CM | POA: Diagnosis not present

## 2020-10-05 MED ORDER — CYANOCOBALAMIN 1000 MCG/ML IJ SOLN
INTRAMUSCULAR | Status: AC
  Filled 2020-10-05: qty 1

## 2020-10-05 MED ORDER — CYANOCOBALAMIN 1000 MCG/ML IJ SOLN
1000.0000 ug | Freq: Once | INTRAMUSCULAR | Status: AC
  Administered 2020-10-05: 1000 ug via INTRAMUSCULAR

## 2020-10-16 NOTE — Progress Notes (Signed)
Remote ICD transmission.   

## 2020-11-02 ENCOUNTER — Inpatient Hospital Stay: Payer: Medicare HMO | Attending: Oncology

## 2020-11-02 ENCOUNTER — Other Ambulatory Visit: Payer: Self-pay

## 2020-11-02 VITALS — BP 124/70 | HR 66 | Resp 18

## 2020-11-02 DIAGNOSIS — E538 Deficiency of other specified B group vitamins: Secondary | ICD-10-CM | POA: Diagnosis present

## 2020-11-02 DIAGNOSIS — D509 Iron deficiency anemia, unspecified: Secondary | ICD-10-CM

## 2020-11-02 MED ORDER — CYANOCOBALAMIN 1000 MCG/ML IJ SOLN
INTRAMUSCULAR | Status: AC
  Filled 2020-11-02: qty 1

## 2020-11-02 MED ORDER — CYANOCOBALAMIN 1000 MCG/ML IJ SOLN
1000.0000 ug | Freq: Once | INTRAMUSCULAR | Status: AC
  Administered 2020-11-02: 1000 ug via INTRAMUSCULAR

## 2020-11-02 NOTE — Patient Instructions (Signed)

## 2020-11-08 ENCOUNTER — Other Ambulatory Visit: Payer: Self-pay | Admitting: Internal Medicine

## 2020-11-08 DIAGNOSIS — I4819 Other persistent atrial fibrillation: Secondary | ICD-10-CM

## 2020-11-08 DIAGNOSIS — Z5181 Encounter for therapeutic drug level monitoring: Secondary | ICD-10-CM

## 2020-11-08 DIAGNOSIS — I639 Cerebral infarction, unspecified: Secondary | ICD-10-CM

## 2020-11-08 NOTE — Telephone Encounter (Signed)
Prescription refill request for Xarelto received.  Indication: Last office visit: 09/12/2020 Weight: 97.5kg Age: 75 Scr: 0.88  CrCl: 103.10  Based on above findings Xarelto 20mg  daily is appropriate dose.  30 day supply given.  Pt is due for repeat BMP.  Called pt and asked [facility].  He verbalized understanding.

## 2020-11-08 NOTE — Telephone Encounter (Signed)
This is a [place] pt.  °

## 2020-11-09 ENCOUNTER — Ambulatory Visit: Payer: Medicare HMO

## 2020-11-09 ENCOUNTER — Encounter: Payer: Self-pay | Admitting: Internal Medicine

## 2020-11-09 LAB — BASIC METABOLIC PANEL WITH GFR
BUN: 23 mg/dL (ref 7–25)
CO2: 33 mmol/L — ABNORMAL HIGH (ref 20–32)
Calcium: 9.1 mg/dL (ref 8.6–10.3)
Chloride: 100 mmol/L (ref 98–110)
Creat: 1.15 mg/dL (ref 0.70–1.18)
GFR, Est African American: 73 mL/min/{1.73_m2} (ref >=60)
GFR, Est Non African American: 63 mL/min/{1.73_m2} (ref >=60)
Glucose, Bld: 218 mg/dL — ABNORMAL HIGH (ref 65–139)
Potassium: 3.7 mmol/L (ref 3.5–5.3)
Sodium: 141 mmol/L (ref 135–146)

## 2020-11-28 ENCOUNTER — Telehealth: Payer: Self-pay | Admitting: Internal Medicine

## 2020-11-28 MED ORDER — RIVAROXABAN 20 MG PO TABS: 20.0000 mg | ORAL_TABLET | Freq: Every day | ORAL | 6 refills | Status: DC

## 2020-11-28 NOTE — Telephone Encounter (Signed)
Follow Up:      Pt would like his lab results from last week please. 

## 2020-11-28 NOTE — Telephone Encounter (Signed)
Called pt with results from BMP obtained for DOAC follow up.  Based on labs pt will continue Xarelto 20mg  daily.  He verbalized understanding.

## 2020-11-30 ENCOUNTER — Inpatient Hospital Stay: Payer: Medicare HMO

## 2020-11-30 ENCOUNTER — Inpatient Hospital Stay: Payer: Medicare HMO | Attending: Oncology

## 2020-11-30 ENCOUNTER — Other Ambulatory Visit: Payer: Self-pay

## 2020-11-30 VITALS — BP 120/66 | HR 69 | Resp 18

## 2020-11-30 DIAGNOSIS — D509 Iron deficiency anemia, unspecified: Secondary | ICD-10-CM

## 2020-11-30 DIAGNOSIS — E538 Deficiency of other specified B group vitamins: Secondary | ICD-10-CM | POA: Diagnosis present

## 2020-11-30 MED ORDER — CYANOCOBALAMIN 1000 MCG/ML IJ SOLN
INTRAMUSCULAR | Status: AC
  Filled 2020-11-30: qty 1

## 2020-11-30 MED ORDER — CYANOCOBALAMIN 1000 MCG/ML IJ SOLN
1000.0000 ug | Freq: Once | INTRAMUSCULAR | Status: AC
  Administered 2020-11-30: 1000 ug via INTRAMUSCULAR

## 2020-11-30 NOTE — Patient Instructions (Signed)

## 2020-12-07 ENCOUNTER — Ambulatory Visit: Payer: Medicare HMO

## 2020-12-11 ENCOUNTER — Other Ambulatory Visit: Payer: Self-pay | Admitting: *Deleted

## 2020-12-11 ENCOUNTER — Ambulatory Visit: Payer: Medicare HMO | Admitting: Podiatry

## 2020-12-11 ENCOUNTER — Other Ambulatory Visit: Payer: Self-pay

## 2020-12-11 ENCOUNTER — Other Ambulatory Visit: Payer: Self-pay | Admitting: Internal Medicine

## 2020-12-11 DIAGNOSIS — B351 Tinea unguium: Secondary | ICD-10-CM | POA: Diagnosis not present

## 2020-12-11 DIAGNOSIS — M722 Plantar fascial fibromatosis: Secondary | ICD-10-CM

## 2020-12-11 DIAGNOSIS — E1169 Type 2 diabetes mellitus with other specified complication: Secondary | ICD-10-CM | POA: Diagnosis not present

## 2020-12-11 DIAGNOSIS — E1151 Type 2 diabetes mellitus with diabetic peripheral angiopathy without gangrene: Secondary | ICD-10-CM

## 2020-12-11 DIAGNOSIS — D689 Coagulation defect, unspecified: Secondary | ICD-10-CM

## 2020-12-11 DIAGNOSIS — E119 Type 2 diabetes mellitus without complications: Secondary | ICD-10-CM | POA: Diagnosis not present

## 2020-12-11 DIAGNOSIS — L84 Corns and callosities: Secondary | ICD-10-CM | POA: Diagnosis not present

## 2020-12-11 DIAGNOSIS — M2041 Other hammer toe(s) (acquired), right foot: Secondary | ICD-10-CM

## 2020-12-11 DIAGNOSIS — M2042 Other hammer toe(s) (acquired), left foot: Secondary | ICD-10-CM

## 2020-12-11 MED ORDER — BETAMETHASONE SOD PHOS & ACET 6 (3-3) MG/ML IJ SUSP
6.0000 mg | Freq: Once | INTRAMUSCULAR | Status: AC
  Administered 2020-12-11: 6 mg

## 2020-12-11 NOTE — Telephone Encounter (Signed)
Prescription refill request for Xarelto received.  Indication:  CVA Last office visit:  09/12/20 Weight:  97.5kg Age:  74 Scr:  1.15 CrCl:  78.89  Received refill request for Xarelto 20mg daily. Based on above findings this is the appropriate dose.  Refill approved.   . 

## 2020-12-11 NOTE — Telephone Encounter (Signed)
Prescription refill request for Xarelto received.  Indication:  CVA Last office visit:  09/12/20 Weight:  97.5kg Age:  75 Scr:  1.15 CrCl:  78.89  Received refill request for Xarelto 20mg  daily. Based on above findings this is the appropriate dose.  Refill approved.   

## 2020-12-11 NOTE — Telephone Encounter (Signed)
Prescription refill request for Xarelto received.  Indication:  CVA Last office visit:  09/12/20 Weight:  97.5kg Age:  75 Scr:  1.15 CrCl:  78.89  Based on above finding Xarelto 20mg  daily is the appropriate dose.  Refill approved.

## 2020-12-11 NOTE — Progress Notes (Signed)
  Subjective:  Patient ID: [name], male    DOB: April 22, 1946,  MRN: [id number]  Chief Complaint  Patient presents with  . Nail Problem    Dry brittle cracked nails on both feet x 10+ years.     75 y.o. male presents with the above complaint. History confirmed with patient. Also complains of pain to the right heel.  Objective:  Physical Exam: warm, good capillary refill, nail exam onychomycosis of the toenails, no trophic changes or ulcerative lesions. DP palpable, PT non-palpable Left Foot: hammertoes noted  Right Foot: hammertoes noted; 1st met pre-ulcerative lesion; POP medial calc tuber  No images are attached to the encounter.  Assessment:   1. Plantar fasciitis   2. Encounter for diabetic foot exam ([place])   3. Onychomycosis of multiple toenails with type 2 diabetes mellitus and peripheral angiopathy (HCC)   4. Coagulation defect (HCC)   5. Callus of foot   6. Hammertoes of both feet      Plan:  Patient was evaluated and treated and all questions answered.  Plantar Fasciitis -Patient is diabetic with a qualifying condition for at risk foot care. -Would benefit from DM shoes. Will make appt for fabrication  Procedure: Nail Debridement Type of Debridement: manual, sharp debridement. Instrumentation: Nail nipper, rotary burr. Number of Nails: 10  Procedure: Paring of Lesion Rationale: painful hyperkeratotic lesion Type of Debridement: manual, sharp debridement. Instrumentation: 312 blade Number of Lesions: 1  Plantar Fasciitis -Injection delivered to the plantar fascia of the right foot.  Procedure: Injection Tendon/Ligament Consent: Verbal consent obtained. Location: Right plantar fascia at the glabrous junction; medial approach. Skin Prep: Alcohol. Injectate: 1 cc 0.5% marcaine plain, 1 cc betamethasone acetate-betamethasone sodium phosphate Disposition: Patient tolerated procedure well. Injection site dressed with a band-aid.  Return in about 3  months (around 03/13/2021) for Diabetic Foot Care.

## 2021-01-01 ENCOUNTER — Ambulatory Visit (INDEPENDENT_AMBULATORY_CARE_PROVIDER_SITE_OTHER): Payer: Medicare HMO

## 2021-01-01 DIAGNOSIS — I255 Ischemic cardiomyopathy: Secondary | ICD-10-CM

## 2021-01-02 ENCOUNTER — Inpatient Hospital Stay: Payer: Medicare HMO | Admitting: Oncology

## 2021-01-02 ENCOUNTER — Other Ambulatory Visit: Payer: Self-pay

## 2021-01-02 ENCOUNTER — Inpatient Hospital Stay: Payer: Medicare HMO

## 2021-01-02 ENCOUNTER — Inpatient Hospital Stay: Payer: Medicare HMO | Attending: Oncology

## 2021-01-02 VITALS — BP 117/61 | HR 78 | Temp 97.0°F | Resp 18 | Ht 74.0 in | Wt 223.7 lb

## 2021-01-02 DIAGNOSIS — R350 Frequency of micturition: Secondary | ICD-10-CM | POA: Diagnosis not present

## 2021-01-02 DIAGNOSIS — R351 Nocturia: Secondary | ICD-10-CM | POA: Diagnosis not present

## 2021-01-02 DIAGNOSIS — D518 Other vitamin B12 deficiency anemias: Secondary | ICD-10-CM | POA: Insufficient documentation

## 2021-01-02 DIAGNOSIS — D509 Iron deficiency anemia, unspecified: Secondary | ICD-10-CM

## 2021-01-02 DIAGNOSIS — K909 Intestinal malabsorption, unspecified: Secondary | ICD-10-CM | POA: Diagnosis not present

## 2021-01-02 DIAGNOSIS — E538 Deficiency of other specified B group vitamins: Secondary | ICD-10-CM | POA: Insufficient documentation

## 2021-01-02 LAB — CUP PACEART REMOTE DEVICE CHECK
Battery Remaining Longevity: 40 mo
Battery Remaining Percentage: 44 %
Battery Voltage: 2.89 V
Brady Statistic RV Percent Paced: 24 %
Date Time Interrogation Session: 20220406052501
HighPow Impedance: 65 Ohm
HighPow Impedance: 65 Ohm
Implantable Lead Implant Date: 20090708
Implantable Lead Implant Date: 20090708
Implantable Lead Location: 753859
Implantable Lead Location: 753860
Implantable Lead Model: 7122
Implantable Pulse Generator Implant Date: 20170106
Lead Channel Impedance Value: 350 Ohm
Lead Channel Pacing Threshold Amplitude: 0.75 V
Lead Channel Pacing Threshold Pulse Width: 0.9 ms
Lead Channel Sensing Intrinsic Amplitude: 12 mV
Lead Channel Setting Pacing Amplitude: 2.5 V
Lead Channel Setting Pacing Pulse Width: 0.9 ms
Lead Channel Setting Sensing Sensitivity: 0.5 mV
Pulse Gen Serial Number: 7306179

## 2021-01-02 LAB — CBC WITH DIFFERENTIAL (CANCER CENTER ONLY)
Abs Immature Granulocytes: 0.03 10*3/uL (ref 0.00–0.07)
Basophils Absolute: 0 10*3/uL (ref 0.0–0.1)
Basophils Relative: 0 %
Eosinophils Absolute: 0.2 10*3/uL (ref 0.0–0.5)
Eosinophils Relative: 2 %
HCT: 41.2 % (ref 39.0–52.0)
Hemoglobin: 13.4 g/dL (ref 13.0–17.0)
Immature Granulocytes: 0 %
Lymphocytes Relative: 15 %
Lymphs Abs: 1.1 10*3/uL (ref 0.7–4.0)
MCH: 28.2 pg (ref 26.0–34.0)
MCHC: 32.5 g/dL (ref 30.0–36.0)
MCV: 86.6 fL (ref 80.0–100.0)
Monocytes Absolute: 0.8 10*3/uL (ref 0.1–1.0)
Monocytes Relative: 12 %
Neutro Abs: 4.7 10*3/uL (ref 1.7–7.7)
Neutrophils Relative %: 71 %
Platelet Count: 179 10*3/uL (ref 150–400)
RBC: 4.76 MIL/uL (ref 4.22–5.81)
RDW: 15.4 % (ref 11.5–15.5)
WBC Count: 6.8 10*3/uL (ref 4.0–10.5)
nRBC: 0 % (ref 0.0–0.2)

## 2021-01-02 LAB — IRON AND TIBC
Iron: 39 ug/dL — ABNORMAL LOW (ref 42–163)
Saturation Ratios: 11 % — ABNORMAL LOW (ref 20–55)
TIBC: 346 ug/dL (ref 202–409)
UIBC: 308 ug/dL (ref 117–376)

## 2021-01-02 LAB — FERRITIN: Ferritin: 56 ng/mL (ref 24–336)

## 2021-01-02 LAB — VITAMIN B12: Vitamin B-12: 422 pg/mL (ref 180–914)

## 2021-01-02 MED ORDER — CYANOCOBALAMIN 1000 MCG/ML IJ SOLN
INTRAMUSCULAR | Status: AC
  Filled 2021-01-02: qty 1

## 2021-01-02 MED ORDER — CYANOCOBALAMIN 1000 MCG/ML IJ SOLN
1000.0000 ug | Freq: Once | INTRAMUSCULAR | Status: AC
  Administered 2021-01-02: 1000 ug via INTRAMUSCULAR

## 2021-01-02 NOTE — Progress Notes (Signed)
[facility] HEMATOLOGY OFFICE PROGRESS NOTE 01/02/21    DIAGNOSIS: 75 year old man with B12 deficiency anemia diagnosed in 2008.  He has also poor iron absorption issues requiring intravenous iron infusion.   CURRENT THERAPY:    B-12 1000 mcg IM injection monthly started in 2008.  IV iron infusion as needed.  Last treatment given in March 2021.  INTERVAL HISTORY:  [name] presents today for a follow-up visit.  Since the last visit, he reports no major changes in his health.  He denies any fatigue, tiredness or hematochezia.  He denies melena or hemoptysis.  He has reported some urinary frequency and nocturia but no hematuria.  He remains active and at this activities of daily living including caring for his disabled wife.  He denies any hospitalization or illnesses.      MEDICATIONS: Reviewed without changes.  Current Outpatient Medications  Medication Sig Dispense Refill  . atorvastatin (LIPITOR) 40 MG tablet     . baclofen (LIORESAL) 20 MG tablet Take 40 mg by mouth at bedtime.     . carvedilol (COREG) 25 MG tablet Take 1 tablet (25 mg total) by mouth 2 (two) times daily with a meal. 180 tablet 3  . DULoxetine (CYMBALTA) 30 MG capsule duloxetine 30 mg capsule,delayed release  Take 1 capsule every day by oral route.    . furosemide (LASIX) 40 MG tablet Take 40 mg by mouth daily as needed for fluid.     [name] glimepiride (AMARYL) 4 MG tablet Take 4 mg by mouth daily with breakfast.    . linagliptin (TRADJENTA) 5 MG TABS tablet Take 5 mg by mouth daily after breakfast.     . meclizine (ANTIVERT) 25 MG tablet Take 25 mg by mouth 4 (four) times daily as needed. For dizziness    . metFORMIN (GLUCOPHAGE) 500 MG tablet     . oxyCODONE-acetaminophen (PERCOCET/ROXICET) 5-325 MG tablet Take a half tablet every 6 hours    . SOLIQUA 100-33 UNT-MCG/ML SOPN     . spironolactone (ALDACTONE) 25 MG tablet Take 1 tablet (25 mg total) by mouth every morning. 90 tablet 3  .  tamsulosin (FLOMAX) 0.4 MG CAPS capsule tamsulosin 0.4 mg capsule  TAKE 1 CAPSULE BY MOUTH ONCE DAILY    . XARELTO 20 MG TABS tablet TAKE 1 TABLET BY MOUTH ONCE DAILY WITH SUPPER 30 tablet 6   No current facility-administered medications for this visit.     PHYSICAL EXAMINATION:  Blood pressure 117/61, pulse 78, temperature (!) 97 F (36.1 C), temperature source Tympanic, resp. rate 18, height 6\' 2"  (1.88 m), weight 223 lb 11.2 oz (101.5 kg), SpO2 99 %.    ECOG PERFORMANCE STATUS: 1     General appearance: Alert, awake without any distress. Head: Atraumatic without abnormalities Oropharynx: Without any thrush or ulcers. Eyes: No scleral icterus. Lymph nodes: No lymphadenopathy noted in the cervical, supraclavicular, or axillary nodes Heart:regular rate and rhythm, without any murmurs or gallops.   Lung: Clear to auscultation without any rhonchi, wheezes or dullness to percussion. Abdomin: Soft, nontender without any shifting dullness or ascites. Musculoskeletal: No clubbing or cyanosis. Neurological: No motor or sensory deficits. Skin: No rashes or lesions.         Labs:  Lab Results  Component Value Date   WBC 6.8 01/02/2021   HGB 13.4 01/02/2021   HCT 41.2 01/02/2021   MCV 86.6 01/02/2021   PLT 179 01/02/2021   NEUTROABS 4.7 01/02/2021      Chemistry  Component Value Date/Time   NA 141 11/08/2020 1415   NA 141 06/02/2017 0952   K 3.7 11/08/2020 1415   K 4.0 06/02/2017 0952   CL 100 11/08/2020 1415   CL 100 01/06/2013 0830   CO2 33 (H) 11/08/2020 1415   CO2 28 06/02/2017 0952   BUN 23 11/08/2020 1415   BUN 15.7 06/02/2017 0952   CREATININE 1.15 11/08/2020 1415   CREATININE 0.9 06/02/2017 0952      Component Value Date/Time   CALCIUM 9.1 11/08/2020 1415   CALCIUM 9.4 06/02/2017 0952   ALKPHOS 98 04/27/2019 0153   ALKPHOS 81 06/02/2017 0952   AST 15 04/27/2019 0153   AST 13 06/02/2017 0952   ALT 11 04/27/2019 0153   ALT 9 06/02/2017 0952    BILITOT 0.5 04/27/2019 0153   BILITOT 0.46 06/02/2017 0952        Assessment and plan:    75 year old man with:   1.  Anemia related to B12 deficiency diagnosed in 2008.  His deficiency is related to poor absorption.  The natural course of this disease was reviewed at this time and treatment options were discussed.  His hemoglobin today is adequate at 13.4 without any need for transfusion.  B12 level is currently pending and has been maintained reasonably well on monthly injections.  Risks and benefits of continuing this treatment were discussed at this time he is agreeable to continue.    2. Iron deficiency related to chronic absorption issues.  He received intravenous iron infusion without any complications.  Iron studies are currently pending.  Risks and benefits of repeating intravenous iron were reviewed.  These include arthralgias, myalgias and infusion related complaints.  We will await the results of his iron studies and supplement as needed.   3.  Covid vaccination considerations: I recommended proceeding with 4th injection given his age.  He is agreeable and will schedule that in the future.  4.  Urological concerns: I urged him to follow-up with urology regarding this issue which is likely consistent with BPH.  5. Follow-up: Monthly B12 injection and MD follow-up in 6 months.  30  minutes were dedicated to this visit.  The time was spent on reviewing laboratory data, disease status update and treatment options for the future.   [name] Hose MD 01/02/21

## 2021-01-02 NOTE — Patient Instructions (Signed)

## 2021-01-04 ENCOUNTER — Other Ambulatory Visit: Payer: Medicare HMO

## 2021-01-04 ENCOUNTER — Ambulatory Visit: Payer: Medicare HMO

## 2021-01-04 ENCOUNTER — Ambulatory Visit: Payer: Medicare HMO | Admitting: Oncology

## 2021-01-11 NOTE — Progress Notes (Signed)
Remote ICD transmission.   

## 2021-01-14 ENCOUNTER — Other Ambulatory Visit: Payer: Medicare HMO

## 2021-01-17 ENCOUNTER — Encounter: Payer: Self-pay | Admitting: Internal Medicine

## 2021-01-23 ENCOUNTER — Ambulatory Visit (INDEPENDENT_AMBULATORY_CARE_PROVIDER_SITE_OTHER): Payer: Medicare HMO

## 2021-01-23 ENCOUNTER — Other Ambulatory Visit: Payer: Self-pay

## 2021-01-23 DIAGNOSIS — E1169 Type 2 diabetes mellitus with other specified complication: Secondary | ICD-10-CM

## 2021-01-23 DIAGNOSIS — E1151 Type 2 diabetes mellitus with diabetic peripheral angiopathy without gangrene: Secondary | ICD-10-CM

## 2021-01-23 DIAGNOSIS — B351 Tinea unguium: Secondary | ICD-10-CM

## 2021-01-23 NOTE — Progress Notes (Signed)
Patient was foam casted today and picked orthoFeet 674 as 1st option and Apex V753M as 2nd option. Shoe size 13 mens wide. Patient unsure of Diabetic provider's name. Advised patient that the office will call when shoes are ready for pick-up.

## 2021-02-01 ENCOUNTER — Inpatient Hospital Stay: Payer: Medicare HMO

## 2021-02-01 ENCOUNTER — Inpatient Hospital Stay: Payer: Medicare HMO | Attending: Oncology

## 2021-02-01 ENCOUNTER — Other Ambulatory Visit: Payer: Self-pay

## 2021-02-01 VITALS — BP 127/73 | HR 70 | Temp 98.2°F

## 2021-02-01 DIAGNOSIS — D518 Other vitamin B12 deficiency anemias: Secondary | ICD-10-CM | POA: Insufficient documentation

## 2021-02-01 DIAGNOSIS — K909 Intestinal malabsorption, unspecified: Secondary | ICD-10-CM | POA: Diagnosis not present

## 2021-02-01 DIAGNOSIS — R351 Nocturia: Secondary | ICD-10-CM | POA: Diagnosis not present

## 2021-02-01 DIAGNOSIS — D509 Iron deficiency anemia, unspecified: Secondary | ICD-10-CM

## 2021-02-01 DIAGNOSIS — E538 Deficiency of other specified B group vitamins: Secondary | ICD-10-CM

## 2021-02-01 DIAGNOSIS — R350 Frequency of micturition: Secondary | ICD-10-CM | POA: Diagnosis not present

## 2021-02-01 LAB — CBC WITH DIFFERENTIAL (CANCER CENTER ONLY)
Abs Immature Granulocytes: 0.02 10*3/uL (ref 0.00–0.07)
Basophils Absolute: 0 10*3/uL (ref 0.0–0.1)
Basophils Relative: 1 %
Eosinophils Absolute: 0.2 10*3/uL (ref 0.0–0.5)
Eosinophils Relative: 2 %
HCT: 40.1 % (ref 39.0–52.0)
Hemoglobin: 13.2 g/dL (ref 13.0–17.0)
Immature Granulocytes: 0 %
Lymphocytes Relative: 16 %
Lymphs Abs: 1.2 10*3/uL (ref 0.7–4.0)
MCH: 28.1 pg (ref 26.0–34.0)
MCHC: 32.9 g/dL (ref 30.0–36.0)
MCV: 85.3 fL (ref 80.0–100.0)
Monocytes Absolute: 1 10*3/uL (ref 0.1–1.0)
Monocytes Relative: 13 %
Neutro Abs: 5.3 10*3/uL (ref 1.7–7.7)
Neutrophils Relative %: 68 %
Platelet Count: 193 10*3/uL (ref 150–400)
RBC: 4.7 MIL/uL (ref 4.22–5.81)
RDW: 15.4 % (ref 11.5–15.5)
WBC Count: 7.7 10*3/uL (ref 4.0–10.5)
nRBC: 0 % (ref 0.0–0.2)

## 2021-02-01 LAB — VITAMIN B12: Vitamin B-12: 394 pg/mL (ref 180–914)

## 2021-02-01 LAB — IRON AND TIBC
Iron: 35 ug/dL — ABNORMAL LOW (ref 42–163)
Saturation Ratios: 10 % — ABNORMAL LOW (ref 20–55)
TIBC: 361 ug/dL (ref 202–409)
UIBC: 326 ug/dL (ref 117–376)

## 2021-02-01 LAB — FERRITIN: Ferritin: 50 ng/mL (ref 24–336)

## 2021-02-01 MED ORDER — CYANOCOBALAMIN 1000 MCG/ML IJ SOLN
1000.0000 ug | Freq: Once | INTRAMUSCULAR | Status: AC
  Administered 2021-02-01: 1000 ug via INTRAMUSCULAR

## 2021-02-01 MED ORDER — CYANOCOBALAMIN 1000 MCG/ML IJ SOLN
INTRAMUSCULAR | Status: AC
  Filled 2021-02-01: qty 1

## 2021-02-01 NOTE — Patient Instructions (Signed)

## 2021-03-01 ENCOUNTER — Telehealth: Payer: Self-pay | Admitting: Oncology

## 2021-03-01 ENCOUNTER — Inpatient Hospital Stay: Payer: Medicare HMO

## 2021-03-01 NOTE — Telephone Encounter (Signed)
R/s appt per 6/3 sch msg. Pt aware.  

## 2021-03-05 ENCOUNTER — Inpatient Hospital Stay: Payer: Medicare HMO | Attending: Oncology

## 2021-03-05 ENCOUNTER — Other Ambulatory Visit: Payer: Self-pay

## 2021-03-05 VITALS — BP 121/69 | HR 64 | Temp 97.7°F | Resp 18

## 2021-03-05 DIAGNOSIS — R351 Nocturia: Secondary | ICD-10-CM | POA: Diagnosis not present

## 2021-03-05 DIAGNOSIS — D518 Other vitamin B12 deficiency anemias: Secondary | ICD-10-CM | POA: Insufficient documentation

## 2021-03-05 DIAGNOSIS — K909 Intestinal malabsorption, unspecified: Secondary | ICD-10-CM | POA: Diagnosis not present

## 2021-03-05 DIAGNOSIS — D509 Iron deficiency anemia, unspecified: Secondary | ICD-10-CM

## 2021-03-05 DIAGNOSIS — R350 Frequency of micturition: Secondary | ICD-10-CM | POA: Diagnosis not present

## 2021-03-05 MED ORDER — CYANOCOBALAMIN 1000 MCG/ML IJ SOLN
1000.0000 ug | Freq: Once | INTRAMUSCULAR | Status: AC
  Administered 2021-03-05: 1000 ug via INTRAMUSCULAR

## 2021-03-05 MED ORDER — CYANOCOBALAMIN 1000 MCG/ML IJ SOLN
INTRAMUSCULAR | Status: AC
  Filled 2021-03-05: qty 1

## 2021-03-05 NOTE — Patient Instructions (Signed)

## 2021-03-08 ENCOUNTER — Ambulatory Visit (INDEPENDENT_AMBULATORY_CARE_PROVIDER_SITE_OTHER): Payer: Medicare HMO | Admitting: Internal Medicine

## 2021-03-08 ENCOUNTER — Encounter: Payer: Self-pay | Admitting: Internal Medicine

## 2021-03-08 ENCOUNTER — Other Ambulatory Visit: Payer: Self-pay

## 2021-03-08 VITALS — BP 128/80 | HR 68 | Ht 74.0 in | Wt 218.0 lb

## 2021-03-08 DIAGNOSIS — I509 Heart failure, unspecified: Secondary | ICD-10-CM

## 2021-03-08 DIAGNOSIS — Z95810 Presence of automatic (implantable) cardiac defibrillator: Secondary | ICD-10-CM

## 2021-03-08 DIAGNOSIS — I428 Other cardiomyopathies: Secondary | ICD-10-CM

## 2021-03-08 NOTE — Patient Instructions (Addendum)
Medication Instructions:  Your physician recommends that you continue on your current medications as directed. Please refer to the Current Medication list given to you today.  Labwork: None ordered.  Testing/Procedures: None ordered.  Follow-Up: Your physician wants you to follow-up in: one year with Lewayne Bunting, MD in [place]   Remote monitoring is used to monitor your ICD from home. This monitoring reduces the number of office visits required to check your device to one time per year. It allows [place] to keep an eye on the functioning of your device to ensure it is working properly. You are scheduled for a device check from home on 04/02/2021. You may send your transmission at any time that day. If you have a wireless device, the transmission will be sent automatically. After your physician reviews your transmission, you will receive a postcard with your next transmission date.  Any Other Special Instructions Will Be Listed Below (If Applicable).  If you need a refill on your cardiac medications before your next appointment, please call your pharmacy.

## 2021-03-08 NOTE — Progress Notes (Signed)
HPI Mr. Blankley returns today for followup. He is a pleasant 75 yo man with a h/o chronic systolic heart failure, poorly controlled atrial fib, h/o GI bleeding on Xarelto, and s/p ICD. I saw him 6 months ago and we recommended uptitration of his coreg. In the interim, he notes he has done well but was noted to have an episode of dizziness.  His Xarelto was restarted. He denies chest pain. He has mild peripheral edema. He is remodeling a house.  Allergies  Allergen Reactions   Codeine Nausea And Vomiting     Current Outpatient Medications  Medication Sig Dispense Refill   atorvastatin (LIPITOR) 40 MG tablet      baclofen (LIORESAL) 20 MG tablet Take 40 mg by mouth at bedtime.      carvedilol (COREG) 25 MG tablet Take 1 tablet (25 mg total) by mouth 2 (two) times daily with a meal. 180 tablet 3   DULoxetine (CYMBALTA) 30 MG capsule duloxetine 30 mg capsule,delayed release  Take 1 capsule every day by oral route.     furosemide (LASIX) 40 MG tablet Take 40 mg by mouth daily as needed for fluid.      glimepiride (AMARYL) 4 MG tablet Take 4 mg by mouth daily with breakfast.     linagliptin (TRADJENTA) 5 MG TABS tablet Take 5 mg by mouth daily after breakfast.      meclizine (ANTIVERT) 25 MG tablet Take 25 mg by mouth 4 (four) times daily as needed. For dizziness     metFORMIN (GLUCOPHAGE) 500 MG tablet      oxyCODONE-acetaminophen (PERCOCET/ROXICET) 5-325 MG tablet Take a half tablet every 6 hours     SOLIQUA 100-33 UNT-MCG/ML SOPN      spironolactone (ALDACTONE) 25 MG tablet Take 1 tablet (25 mg total) by mouth every morning. 90 tablet 3   tamsulosin (FLOMAX) 0.4 MG CAPS capsule tamsulosin 0.4 mg capsule  TAKE 1 CAPSULE BY MOUTH ONCE DAILY     XARELTO 20 MG TABS tablet TAKE 1 TABLET BY MOUTH ONCE DAILY WITH SUPPER 30 tablet 6   triamcinolone cream (KENALOG) 0.5 % triamcinolone acetonide 0.5 % topical cream  APPLY CREAM TOPICALLY TO AFFECTED AREA TWICE DAILY     No current  facility-administered medications for this visit.     Past Medical History:  Diagnosis Date   Anemia    Multifactorial  - hx of B12 def and iron deficiency, followed by heme, followed at [facility]   Automatic implantable cardioverter-defibrillator in situ    CAD (coronary artery disease)    a. STEMI s/p CABG 07/2007 (had preop cardiogenic shock, IABP, VDRF before surgery)   Cardiomyopathy, ischemic    a. Chronic systolic CHF s/p St. Jude dual chamber ICD 03/2008.   Carotid stenosis    a. Prior carotid dz/ surgery. b. Carotid dopp 07/2011 - 0-39% bilaterally.;   c. Doppler 11/13: 40-59% RICA, 0-39% LICA   Chronic renal insufficiency    Diabetes mellitus    GI bleed    Reported history   Hx of CABG    Hypertension    [facility]    Mitral regurgitation    Myocardial infarction [facility])    Neuromuscular disorder (HCC)    R sided weakness    Osteoarthritis    PAF (paroxysmal atrial fibrillation) (HCC)    a. Poor Coumadin candidate due to hx of GI bleed.   Sinus bradycardia    Sleep apnea    study done,  10 yrs ago, unable to tolerate CPAP   Splenomegaly    Stroke [facility])    a. CVA 1990s - chronic pain in RUE after stroke.   Systolic CHF, chronic (HCC)    Previously taken off lisinopril by primary doctor due to labs   Tobacco abuse     ROS:   All systems reviewed and negative except as noted in the HPI.   Past Surgical History:  Procedure Laterality Date   CARDIAC DEFIBRILLATOR PLACEMENT  04/05/2008   Implantation of a St. Jude dual-chamber defibrillator, Doylene Canning. Ladona Ridgel , MD   CARDIAC DEFIBRILLATOR PLACEMENT     CHOLECYSTECTOMY N/A 02/19/2016   Procedure: LAPAROSCOPIC CHOLECYSTECTOMY;  Surgeon: Abigail Miyamoto, MD;  Location: [facility] OR;  Service: General;  Laterality: N/A;   COLONOSCOPY  07/2007   Rourk: left-sided diverticulum. TI normal. No etiology for IDA.   CORONARY ARTERY BYPASS GRAFT     CORONARY ARTERY BYPASS GRAFT  08/29/2007   x3; Marilu Favre H. Cornelius Moras  MD   EP IMPLANTABLE DEVICE N/A 10/05/2015   Procedure: ICD Generator Changeout;  Surgeon: Marinus Maw, MD;  Location: [facility] INVASIVE CV LAB;  Service: Cardiovascular;  Laterality: N/A;   ERCP N/A 02/18/2016   Procedure: ENDOSCOPIC RETROGRADE CHOLANGIOPANCREATOGRAPHY (ERCP);  Surgeon: Vida Rigger, MD;  Location: [facility] ENDOSCOPY;  Service: Endoscopy;  Laterality: N/A;   ESOPHAGOGASTRODUODENOSCOPY  07/2007   Rourk: small hh, atrophic gastric mucosa, poor distensibility of stomach ?extrinsic compression, CT showed mild to moderate splenomegaly. No etiology for IDA.   JOINT REPLACEMENT  1990's   L knee   LAPAROSCOPIC APPENDECTOMY N/A 09/14/2017   Procedure: APPENDECTOMY LAPAROSCOPIC;  Surgeon: Jimmye Norman, MD;  Location: [facility] OR;  Service: General;  Laterality: N/A;   Percutaneous coronary intervention  01/10/2005   Charlies Constable, MD   TONSILLECTOMY     TOTAL KNEE ARTHROPLASTY Right 08/16/2013   Procedure: RIGHT TOTAL KNEE ARTHROPLASTY;  Surgeon: Kathryne Hitch, MD;  Location[facility];  Service: Orthopedics;  Laterality: Right;     Family History  Problem Relation Age of Onset   Heart attack Mother        CVA, MI   Diabetes Mother    Heart attack Father        MI   Diabetes Sister    Coronary artery disease Brother        CABG     Social History   Socioeconomic History   Marital status: Unknown    Spouse name: Not on file   Number of children: Not on file   Years of education: Not on file   Highest education level: Not on file  Occupational History   Occupation: Retired    Associate Professor: UNEMPLOYED  Tobacco Use   Smoking status: Former    Packs/day: 1.00    Years: 15.00    Pack years: 15.00    Types: Cigarettes    Quit date: 09/29/1990    Years since quitting: 30.4   Smokeless tobacco: Former    Quit date: 08/09/1988  Vaping Use   Vaping Use: Never used  Substance and Sexual Activity   Alcohol use: No   Drug use: No   Sexual activity: Not on file  Other Topics Concern    Not on file  Social History Narrative   ** Merged History Encounter **       Lives in [place] with wife   Been on disability since his stroke in the 1990s   Not routinely exercising   Social Determinants of  Health   Financial Resource Strain: Not on file  Food Insecurity: Not on file  Transportation Needs: Not on file  Physical Activity: Not on file  Stress: Not on file  Social Connections: Not on file  Intimate Partner Violence: Not on file     BP 128/80   Pulse 68   Ht 6\' 2"  (1.88 m)   Wt 218 lb (98.9 kg)   SpO2 97%   BMI 27.99 kg/m   Physical Exam:  Well appearing NAD HEENT: Unremarkable Neck:  No JVD, no thyromegally Lymphatics:  No adenopathy Back:  No CVA tenderness Lungs:  Clear with no wheezes HEART:  Regular rate rhythm, no murmurs, no rubs, no clicks Abd:  soft, positive bowel sounds, no organomegally, no rebound, no guarding Ext:  2 plus pulses, no edema, no cyanosis, no clubbing Skin:  No rashes no nodules Neuro:  CN II through XII intact, motor grossly intact  EKG - atrial flutter with a controlled VR  DEVICE  Normal device function.  See PaceArt for details.   Assess/Plan:  1. Chronic systolic heart failure - his symptoms are class 2A. He is still working. He will continue his current meds and maintain a low sodium diet. 2. Atrial fib - his VR is improved but still not ideal. I have asked him to increase the coreg to 25 mg twice daily. 3. Coags - he has started back xarelto. He has not had any GI bleeding. 4. HTN - his bp is minimally elevated. Hopefully the bp will improve.   Davina Howlett,MD

## 2021-03-15 ENCOUNTER — Ambulatory Visit: Payer: Medicare HMO | Admitting: Podiatry

## 2021-04-02 ENCOUNTER — Other Ambulatory Visit: Payer: Self-pay

## 2021-04-02 ENCOUNTER — Ambulatory Visit: Payer: Medicare HMO | Admitting: Podiatry

## 2021-04-02 ENCOUNTER — Ambulatory Visit (INDEPENDENT_AMBULATORY_CARE_PROVIDER_SITE_OTHER): Payer: Medicare HMO

## 2021-04-02 ENCOUNTER — Encounter: Payer: Medicare HMO | Admitting: Internal Medicine

## 2021-04-02 DIAGNOSIS — E1169 Type 2 diabetes mellitus with other specified complication: Secondary | ICD-10-CM

## 2021-04-02 DIAGNOSIS — L03116 Cellulitis of left lower limb: Secondary | ICD-10-CM

## 2021-04-02 DIAGNOSIS — E1151 Type 2 diabetes mellitus with diabetic peripheral angiopathy without gangrene: Secondary | ICD-10-CM

## 2021-04-02 DIAGNOSIS — L84 Corns and callosities: Secondary | ICD-10-CM | POA: Diagnosis not present

## 2021-04-02 DIAGNOSIS — B351 Tinea unguium: Secondary | ICD-10-CM | POA: Diagnosis not present

## 2021-04-02 DIAGNOSIS — I428 Other cardiomyopathies: Secondary | ICD-10-CM

## 2021-04-02 DIAGNOSIS — L03115 Cellulitis of right lower limb: Secondary | ICD-10-CM | POA: Diagnosis not present

## 2021-04-02 DIAGNOSIS — M2042 Other hammer toe(s) (acquired), left foot: Secondary | ICD-10-CM

## 2021-04-02 DIAGNOSIS — M2041 Other hammer toe(s) (acquired), right foot: Secondary | ICD-10-CM | POA: Diagnosis not present

## 2021-04-02 MED ORDER — DOXYCYCLINE HYCLATE 100 MG PO TABS: 100.0000 mg | ORAL_TABLET | Freq: Two times a day (BID) | ORAL | 0 refills | Status: DC

## 2021-04-02 NOTE — Progress Notes (Signed)
  Subjective:  Patient ID: [name], male    DOB: July 17, 1946,  MRN: [id number]  Chief Complaint  Patient presents with   Nail Problem    Thick/painful toenails    75 y.o. male presents with the above complaint. History confirmed with patient. Has had redness of his legs for a few weeks, states he tries elevating them without relief.  Objective:  Physical Exam: warm, good capillary refill, nail exam onychomycosis of the toenails, no trophic changes or ulcerative lesions. DP palpable, PT non-palpable Left Foot: hammertoes noted  Right Foot: hammertoes noted  BLE edema, redness  No images are attached to the encounter.  Assessment:   1. Onychomycosis of multiple toenails with type 2 diabetes mellitus and peripheral angiopathy (HCC)   2. Cellulitis of left lower extremity   3. Cellulitis of right lower extremity    Plan:  Patient was evaluated and treated and all questions answered.  Plantar Fasciitis -Patient is diabetic with a qualifying condition for at risk foot care. -DM shoes dispensed today  Procedure: Nail Debridement Type of Debridement: manual, sharp debridement. Instrumentation: Nail nipper, rotary burr. Number of Nails: 10  Cellulitis both legs -Rx doxycycline. F/u in 3 weeks for recheck  Return in about 3 weeks (around 04/23/2021) for cellulitis f/u.

## 2021-04-03 LAB — CUP PACEART REMOTE DEVICE CHECK
Battery Remaining Longevity: 36 mo
Battery Remaining Percentage: 40 %
Battery Voltage: 2.87 V
Brady Statistic RV Percent Paced: 29 %
Date Time Interrogation Session: 20220706015035
HighPow Impedance: 55 Ohm
HighPow Impedance: 55 Ohm
Implantable Lead Implant Date: 20090708
Implantable Lead Implant Date: 20090708
Implantable Lead Location: 753859
Implantable Lead Location: 753860
Implantable Lead Model: 7122
Implantable Pulse Generator Implant Date: 20170106
Lead Channel Impedance Value: 340 Ohm
Lead Channel Pacing Threshold Amplitude: 1 V
Lead Channel Pacing Threshold Pulse Width: 0.9 ms
Lead Channel Sensing Intrinsic Amplitude: 8.3 mV
Lead Channel Setting Pacing Amplitude: 2.5 V
Lead Channel Setting Pacing Pulse Width: 0.9 ms
Lead Channel Setting Sensing Sensitivity: 0.5 mV
Pulse Gen Serial Number: 7306179

## 2021-04-05 ENCOUNTER — Inpatient Hospital Stay: Payer: Medicare HMO | Attending: Oncology

## 2021-04-05 DIAGNOSIS — D518 Other vitamin B12 deficiency anemias: Secondary | ICD-10-CM | POA: Insufficient documentation

## 2021-04-05 DIAGNOSIS — K909 Intestinal malabsorption, unspecified: Secondary | ICD-10-CM | POA: Insufficient documentation

## 2021-04-05 DIAGNOSIS — R350 Frequency of micturition: Secondary | ICD-10-CM | POA: Insufficient documentation

## 2021-04-05 DIAGNOSIS — R351 Nocturia: Secondary | ICD-10-CM | POA: Insufficient documentation

## 2021-04-09 ENCOUNTER — Other Ambulatory Visit: Payer: Self-pay

## 2021-04-09 ENCOUNTER — Inpatient Hospital Stay: Payer: Medicare HMO

## 2021-04-09 DIAGNOSIS — R350 Frequency of micturition: Secondary | ICD-10-CM | POA: Diagnosis not present

## 2021-04-09 DIAGNOSIS — D509 Iron deficiency anemia, unspecified: Secondary | ICD-10-CM

## 2021-04-09 DIAGNOSIS — K909 Intestinal malabsorption, unspecified: Secondary | ICD-10-CM | POA: Diagnosis not present

## 2021-04-09 DIAGNOSIS — D518 Other vitamin B12 deficiency anemias: Secondary | ICD-10-CM | POA: Diagnosis not present

## 2021-04-09 DIAGNOSIS — R351 Nocturia: Secondary | ICD-10-CM | POA: Diagnosis not present

## 2021-04-09 MED ORDER — CYANOCOBALAMIN 1000 MCG/ML IJ SOLN
INTRAMUSCULAR | Status: AC
  Filled 2021-04-09: qty 1

## 2021-04-09 MED ORDER — CYANOCOBALAMIN 1000 MCG/ML IJ SOLN
1000.0000 ug | Freq: Once | INTRAMUSCULAR | Status: AC
  Administered 2021-04-09: 1000 ug via INTRAMUSCULAR

## 2021-04-09 NOTE — Patient Instructions (Signed)

## 2021-04-22 NOTE — Progress Notes (Signed)
Remote ICD transmission.   

## 2021-04-23 ENCOUNTER — Other Ambulatory Visit: Payer: Self-pay

## 2021-04-23 ENCOUNTER — Ambulatory Visit: Payer: Medicare HMO | Admitting: Podiatry

## 2021-04-23 DIAGNOSIS — L03116 Cellulitis of left lower limb: Secondary | ICD-10-CM | POA: Diagnosis not present

## 2021-04-23 DIAGNOSIS — L03115 Cellulitis of right lower limb: Secondary | ICD-10-CM

## 2021-04-23 NOTE — Progress Notes (Signed)
  Subjective:  Patient ID: [name], male    DOB: [birth year],  MRN: [id number]  Chief Complaint  Patient presents with   Cellulitis    Follow up bilateral cellulitis. Pt states improvement. No questions or concerns.     76 y.o. male presents with the above complaint. History confirmed with patient. Had to stop the abx early because of side effects but thinks they helped.  Objective:  Physical Exam: warm, good capillary refill, nail exam onychomycosis of the toenails, no trophic changes or ulcerative lesions. DP palpable, PT non-palpable Left Foot: hammertoes noted  Right Foot: hammertoes noted  BLE edema without active warmth, redness.  No images are attached to the encounter.  Assessment:   1. Cellulitis of left lower extremity   2. Cellulitis of right lower extremity     Plan:  Patient was evaluated and treated and all questions answered.   Cellulitis both legs -Improved. No need for further Abx. Discussed importance of compression socks.  No follow-ups on file.

## 2021-05-03 ENCOUNTER — Inpatient Hospital Stay: Payer: Medicare HMO | Attending: Oncology

## 2021-06-07 ENCOUNTER — Inpatient Hospital Stay: Payer: Medicare HMO | Attending: Oncology

## 2021-06-07 ENCOUNTER — Other Ambulatory Visit: Payer: Self-pay

## 2021-06-07 DIAGNOSIS — R351 Nocturia: Secondary | ICD-10-CM | POA: Diagnosis not present

## 2021-06-07 DIAGNOSIS — R350 Frequency of micturition: Secondary | ICD-10-CM | POA: Insufficient documentation

## 2021-06-07 DIAGNOSIS — K909 Intestinal malabsorption, unspecified: Secondary | ICD-10-CM | POA: Diagnosis not present

## 2021-06-07 DIAGNOSIS — D518 Other vitamin B12 deficiency anemias: Secondary | ICD-10-CM | POA: Insufficient documentation

## 2021-06-07 DIAGNOSIS — D509 Iron deficiency anemia, unspecified: Secondary | ICD-10-CM

## 2021-06-07 MED ORDER — CYANOCOBALAMIN 1000 MCG/ML IJ SOLN
1000.0000 ug | Freq: Once | INTRAMUSCULAR | Status: AC
  Administered 2021-06-07: 1000 ug via INTRAMUSCULAR
  Filled 2021-06-07: qty 1

## 2021-07-02 ENCOUNTER — Ambulatory Visit (INDEPENDENT_AMBULATORY_CARE_PROVIDER_SITE_OTHER): Payer: Medicare HMO

## 2021-07-02 DIAGNOSIS — I428 Other cardiomyopathies: Secondary | ICD-10-CM | POA: Diagnosis not present

## 2021-07-05 ENCOUNTER — Inpatient Hospital Stay: Payer: Medicare HMO | Attending: Oncology

## 2021-07-05 ENCOUNTER — Inpatient Hospital Stay: Payer: Medicare HMO | Admitting: Oncology

## 2021-07-05 ENCOUNTER — Inpatient Hospital Stay: Payer: Medicare HMO

## 2021-07-05 ENCOUNTER — Other Ambulatory Visit: Payer: Self-pay

## 2021-07-05 VITALS — BP 126/74 | HR 75 | Temp 98.2°F | Resp 18 | Wt 219.9 lb

## 2021-07-05 DIAGNOSIS — D509 Iron deficiency anemia, unspecified: Secondary | ICD-10-CM

## 2021-07-05 DIAGNOSIS — D518 Other vitamin B12 deficiency anemias: Secondary | ICD-10-CM | POA: Insufficient documentation

## 2021-07-05 DIAGNOSIS — E538 Deficiency of other specified B group vitamins: Secondary | ICD-10-CM

## 2021-07-05 LAB — CUP PACEART REMOTE DEVICE CHECK
Battery Remaining Longevity: 35 mo
Battery Remaining Percentage: 39 %
Battery Voltage: 2.87 V
Brady Statistic RV Percent Paced: 28 %
Date Time Interrogation Session: 20221006184110
HighPow Impedance: 60 Ohm
HighPow Impedance: 60 Ohm
Implantable Lead Implant Date: 20090708
Implantable Lead Implant Date: 20090708
Implantable Lead Location: 753859
Implantable Lead Location: 753860
Implantable Lead Model: 7122
Implantable Pulse Generator Implant Date: 20170106
Lead Channel Impedance Value: 350 Ohm
Lead Channel Pacing Threshold Amplitude: 1 V
Lead Channel Pacing Threshold Pulse Width: 0.9 ms
Lead Channel Sensing Intrinsic Amplitude: 7.6 mV
Lead Channel Setting Pacing Amplitude: 2.5 V
Lead Channel Setting Pacing Pulse Width: 0.9 ms
Lead Channel Setting Sensing Sensitivity: 0.5 mV
Pulse Gen Serial Number: 7306179

## 2021-07-05 LAB — CBC WITH DIFFERENTIAL (CANCER CENTER ONLY)
Abs Immature Granulocytes: 0.02 10*3/uL (ref 0.00–0.07)
Basophils Absolute: 0 10*3/uL (ref 0.0–0.1)
Basophils Relative: 1 %
Eosinophils Absolute: 0.1 10*3/uL (ref 0.0–0.5)
Eosinophils Relative: 2 %
HCT: 37.7 % — ABNORMAL LOW (ref 39.0–52.0)
Hemoglobin: 12.5 g/dL — ABNORMAL LOW (ref 13.0–17.0)
Immature Granulocytes: 0 %
Lymphocytes Relative: 23 %
Lymphs Abs: 1.4 10*3/uL (ref 0.7–4.0)
MCH: 27.8 pg (ref 26.0–34.0)
MCHC: 33.2 g/dL (ref 30.0–36.0)
MCV: 83.8 fL (ref 80.0–100.0)
Monocytes Absolute: 0.8 10*3/uL (ref 0.1–1.0)
Monocytes Relative: 13 %
Neutro Abs: 3.7 10*3/uL (ref 1.7–7.7)
Neutrophils Relative %: 61 %
Platelet Count: 204 10*3/uL (ref 150–400)
RBC: 4.5 MIL/uL (ref 4.22–5.81)
RDW: 15.6 % — ABNORMAL HIGH (ref 11.5–15.5)
WBC Count: 6.1 10*3/uL (ref 4.0–10.5)
nRBC: 0 % (ref 0.0–0.2)

## 2021-07-05 LAB — IRON AND TIBC
Iron: 39 ug/dL — ABNORMAL LOW (ref 42–163)
Saturation Ratios: 10 % — ABNORMAL LOW (ref 20–55)
TIBC: 376 ug/dL (ref 202–409)
UIBC: 337 ug/dL (ref 117–376)

## 2021-07-05 LAB — VITAMIN B12: Vitamin B-12: 422 pg/mL (ref 180–914)

## 2021-07-05 LAB — FERRITIN: Ferritin: 58 ng/mL (ref 24–336)

## 2021-07-05 MED ORDER — CYANOCOBALAMIN 1000 MCG/ML IJ SOLN
1000.0000 ug | Freq: Once | INTRAMUSCULAR | Status: AC
  Administered 2021-07-05: 1000 ug via INTRAMUSCULAR
  Filled 2021-07-05: qty 1

## 2021-07-05 NOTE — Progress Notes (Signed)
[facility] HEMATOLOGY OFFICE PROGRESS NOTE 07/05/21    DIAGNOSIS: 75 year old man with multifactorial anemia related to iron deficiency and B12 deficiency.  These are related to poor iron and B12 gut absorption.   CURRENT THERAPY:    B-12 1000 mcg IM injection monthly started in 2008.  IV iron infusion when needed with last treatment utilizing Feraheme given in April 2021.  INTERVAL HISTORY:  Mr. Pooley is here for return evaluation.  Since the last visit, he reports no major changes in his health.  He denies any recent hospitalizations or illnesses.  He denies any chest pain or difficulty breathing.  He denies any excessive fatigue or tiredness.  He denies any hematochezia or melena.  His performance status quality of life remained excellent.      MEDICATIONS: Updated on review.  Current Outpatient Medications  Medication Sig Dispense Refill   atorvastatin (LIPITOR) 40 MG tablet      baclofen (LIORESAL) 20 MG tablet Take 40 mg by mouth at bedtime.      carvedilol (COREG) 25 MG tablet Take 1 tablet (25 mg total) by mouth 2 (two) times daily with a meal. 180 tablet 3   doxycycline (VIBRA-TABS) 100 MG tablet Take 1 tablet (100 mg total) by mouth 2 (two) times daily. 14 tablet 0   DULoxetine (CYMBALTA) 30 MG capsule duloxetine 30 mg capsule,delayed release  Take 1 capsule every day by oral route.     furosemide (LASIX) 40 MG tablet Take 40 mg by mouth daily as needed for fluid.      glimepiride (AMARYL) 4 MG tablet Take 4 mg by mouth daily with breakfast.     linagliptin (TRADJENTA) 5 MG TABS tablet Take 5 mg by mouth daily after breakfast.      meclizine (ANTIVERT) 25 MG tablet Take 25 mg by mouth 4 (four) times daily as needed. For dizziness     metFORMIN (GLUCOPHAGE) 500 MG tablet      oxyCODONE-acetaminophen (PERCOCET/ROXICET) 5-325 MG tablet Take a half tablet every 6 hours     SOLIQUA 100-33 UNT-MCG/ML SOPN      spironolactone (ALDACTONE) 25 MG tablet Take 1  tablet (25 mg total) by mouth every morning. 90 tablet 3   tamsulosin (FLOMAX) 0.4 MG CAPS capsule tamsulosin 0.4 mg capsule  TAKE 1 CAPSULE BY MOUTH ONCE DAILY     triamcinolone cream (KENALOG) 0.5 % triamcinolone acetonide 0.5 % topical cream  APPLY CREAM TOPICALLY TO AFFECTED AREA TWICE DAILY     XARELTO 20 MG TABS tablet TAKE 1 TABLET BY MOUTH ONCE DAILY WITH SUPPER 30 tablet 6   No current facility-administered medications for this visit.     PHYSICAL EXAMINATION:   Blood pressure 126/74, pulse 75, temperature 98.2 F (36.8 C), resp. rate 18, weight 219 lb 14.4 oz (99.7 kg), SpO2 98 %.    ECOG PERFORMANCE STATUS: 1    General appearance: Comfortable appearing without any discomfort Head: Normocephalic without any trauma Oropharynx: Mucous membranes are moist and pink without any thrush or ulcers. Eyes: Pupils are equal and round reactive to light. Lymph nodes: No cervical, supraclavicular, inguinal or axillary lymphadenopathy.   Heart:regular rate and rhythm.  S1 and S2 without leg edema. Lung: Clear without any rhonchi or wheezes.  No dullness to percussion. Abdomin: Soft, nontender, nondistended with good bowel sounds.  No hepatosplenomegaly. Musculoskeletal: No joint deformity or effusion.  Full range of motion noted. Neurological: No deficits noted on motor, sensory and deep tendon reflex exam. Skin: No  petechial rash or dryness.  Appeared moist.           Labs:  Lab Results  Component Value Date   WBC 7.7 02/01/2021   HGB 13.2 02/01/2021   HCT 40.1 02/01/2021   MCV 85.3 02/01/2021   PLT 193 02/01/2021   NEUTROABS 5.3 02/01/2021      Chemistry      Component Value Date/Time   NA 141 11/08/2020 1415   NA 141 06/02/2017 0952   K 3.7 11/08/2020 1415   K 4.0 06/02/2017 0952   CL 100 11/08/2020 1415   CL 100 01/06/2013 0830   CO2 33 (H) 11/08/2020 1415   CO2 28 06/02/2017 0952   BUN 23 11/08/2020 1415   BUN 15.7 06/02/2017 0952   CREATININE 1.15  11/08/2020 1415   CREATININE 0.9 06/02/2017 0952      Component Value Date/Time   CALCIUM 9.1 11/08/2020 1415   CALCIUM 9.4 06/02/2017 0952   ALKPHOS 98 04/27/2019 0153   ALKPHOS 81 06/02/2017 0952   AST 15 04/27/2019 0153   AST 13 06/02/2017 0952   ALT 11 04/27/2019 0153   ALT 9 06/02/2017 0952   BILITOT 0.5 04/27/2019 0153   BILITOT 0.46 06/02/2017 0952        Assessment and plan:    75 year old man with:   1.  B12 deficiency diagnosed in 2008 related to poor GI absorption.  He is currently receiving B12 injection on a monthly basis with maintaining adequate levels.  Risks and benefits of continuing this approach were discussed at this time.  He is agreeable to continue at this time.  B12 levels are updated today.    2. Iron deficiency: He has received intravenous iron infusion in the past.  Iron studies in May 2022 showed borderline iron deficiency at 35 with ferritin 50.  We will update these studies today and consider intravenous iron infusion in the future.  Complication associated with these treatments including arthralgias, myalgias and rarely anaphylaxis.  His hemoglobin is declining slowly currently at 12.5 with iron studies are currently pending and likely lower than previous counts.  After discussion today, he is agreeable to proceed with the iron sucrose infusion.  A total of 1000 mg over 5 weeks will be given.  Complications that include arthralgias, myalgias and infusion related issues discussed.  Rarely anaphylaxis was also described.      3. Follow-up:  will continue to receive monthly B12 injection and MD return in 6 months.  He will also return for IV iron infusion in the near future.  30  minutes were spent on this encounter.  The time was dedicated to reviewing laboratory data, disease status update, treatment choices and future plan of care discussion.   Eli Hose MD 07/05/21

## 2021-07-10 NOTE — Progress Notes (Signed)
Remote ICD transmission.   

## 2021-07-12 ENCOUNTER — Other Ambulatory Visit: Payer: Self-pay

## 2021-07-12 ENCOUNTER — Inpatient Hospital Stay: Payer: Medicare HMO

## 2021-07-12 ENCOUNTER — Telehealth: Payer: Self-pay

## 2021-07-12 VITALS — BP 142/85 | HR 77 | Temp 98.4°F | Resp 16

## 2021-07-12 DIAGNOSIS — D509 Iron deficiency anemia, unspecified: Secondary | ICD-10-CM

## 2021-07-12 DIAGNOSIS — D518 Other vitamin B12 deficiency anemias: Secondary | ICD-10-CM | POA: Diagnosis not present

## 2021-07-12 MED ORDER — SODIUM CHLORIDE 0.9 % IV SOLN: Freq: Once | INTRAVENOUS | Status: AC

## 2021-07-12 MED ORDER — SODIUM CHLORIDE 0.9 % IV SOLN
200.0000 mg | Freq: Once | INTRAVENOUS | Status: AC
  Administered 2021-07-12: 200 mg via INTRAVENOUS
  Filled 2021-07-12: qty 200

## 2021-07-12 NOTE — Telephone Encounter (Signed)
This RN called patient regarding his infusion appointment at 0730 today. He states that he was not aware of appointment and did not get a schedule. Loren, RN, made aware and we are awaiting to see if there is room today to fit his appointment in.

## 2021-07-12 NOTE — Patient Instructions (Signed)

## 2021-07-12 NOTE — Progress Notes (Signed)
Patient was observed for 30 minutes post iron infusion with no adverse reactions. Vitals stable and patient in no distress upon leaving infusion room.  

## 2021-07-19 ENCOUNTER — Ambulatory Visit: Payer: Medicare HMO

## 2021-07-19 ENCOUNTER — Other Ambulatory Visit: Payer: Self-pay

## 2021-07-19 ENCOUNTER — Inpatient Hospital Stay: Payer: Medicare HMO

## 2021-07-19 VITALS — BP 148/72 | HR 72 | Temp 98.2°F | Resp 18

## 2021-07-19 DIAGNOSIS — D509 Iron deficiency anemia, unspecified: Secondary | ICD-10-CM

## 2021-07-19 DIAGNOSIS — D518 Other vitamin B12 deficiency anemias: Secondary | ICD-10-CM | POA: Diagnosis not present

## 2021-07-19 MED ORDER — SODIUM CHLORIDE 0.9 % IV SOLN
200.0000 mg | Freq: Once | INTRAVENOUS | Status: AC
  Administered 2021-07-19: 200 mg via INTRAVENOUS
  Filled 2021-07-19: qty 200

## 2021-07-19 MED ORDER — SODIUM CHLORIDE 0.9 % IV SOLN: Freq: Once | INTRAVENOUS | Status: DC

## 2021-07-19 NOTE — Patient Instructions (Signed)

## 2021-07-19 NOTE — Progress Notes (Signed)
Patient waited 30 minutes post observation period with no issues vss upon discharge.

## 2021-07-26 ENCOUNTER — Ambulatory Visit: Payer: Medicare HMO

## 2021-07-26 ENCOUNTER — Ambulatory Visit (INDEPENDENT_AMBULATORY_CARE_PROVIDER_SITE_OTHER): Payer: Medicare HMO | Admitting: Podiatry

## 2021-07-26 ENCOUNTER — Other Ambulatory Visit: Payer: Self-pay

## 2021-07-26 ENCOUNTER — Inpatient Hospital Stay: Payer: Medicare HMO

## 2021-07-26 ENCOUNTER — Ambulatory Visit: Payer: Medicare HMO | Admitting: Podiatry

## 2021-07-26 VITALS — BP 140/82 | HR 75 | Temp 98.0°F | Resp 18 | Ht 74.0 in | Wt 219.8 lb

## 2021-07-26 DIAGNOSIS — D518 Other vitamin B12 deficiency anemias: Secondary | ICD-10-CM | POA: Diagnosis not present

## 2021-07-26 DIAGNOSIS — Z91199 Patient's noncompliance with other medical treatment and regimen due to unspecified reason: Secondary | ICD-10-CM

## 2021-07-26 DIAGNOSIS — D509 Iron deficiency anemia, unspecified: Secondary | ICD-10-CM

## 2021-07-26 MED ORDER — SODIUM CHLORIDE 0.9 % IV SOLN
200.0000 mg | Freq: Once | INTRAVENOUS | Status: AC
  Administered 2021-07-26: 200 mg via INTRAVENOUS
  Filled 2021-07-26: qty 200

## 2021-07-26 MED ORDER — SODIUM CHLORIDE 0.9 % IV SOLN: Freq: Once | INTRAVENOUS | Status: AC

## 2021-07-26 NOTE — Progress Notes (Signed)
No show for appt. 

## 2021-07-26 NOTE — Progress Notes (Signed)
Pt declined to stay for 30 min post observation. Tolerated treatment well with VSS at the beginning and end of infusion.

## 2021-07-26 NOTE — Patient Instructions (Signed)

## 2021-07-30 ENCOUNTER — Ambulatory Visit (INDEPENDENT_AMBULATORY_CARE_PROVIDER_SITE_OTHER): Payer: Self-pay | Admitting: Podiatry

## 2021-07-30 DIAGNOSIS — Z91199 Patient's noncompliance with other medical treatment and regimen due to unspecified reason: Secondary | ICD-10-CM

## 2021-08-02 ENCOUNTER — Ambulatory Visit: Payer: Medicare HMO

## 2021-08-02 ENCOUNTER — Inpatient Hospital Stay: Payer: Medicare HMO | Attending: Oncology

## 2021-08-02 ENCOUNTER — Other Ambulatory Visit: Payer: Self-pay

## 2021-08-02 VITALS — BP 142/72 | HR 72 | Temp 98.2°F | Resp 18

## 2021-08-02 DIAGNOSIS — D518 Other vitamin B12 deficiency anemias: Secondary | ICD-10-CM | POA: Diagnosis present

## 2021-08-02 DIAGNOSIS — D509 Iron deficiency anemia, unspecified: Secondary | ICD-10-CM

## 2021-08-02 DIAGNOSIS — D508 Other iron deficiency anemias: Secondary | ICD-10-CM | POA: Insufficient documentation

## 2021-08-02 MED ORDER — SODIUM CHLORIDE 0.9 % IV SOLN
200.0000 mg | Freq: Once | INTRAVENOUS | Status: AC
  Administered 2021-08-02: 200 mg via INTRAVENOUS
  Filled 2021-08-02: qty 200

## 2021-08-02 MED ORDER — CYANOCOBALAMIN 1000 MCG/ML IJ SOLN
1000.0000 ug | Freq: Once | INTRAMUSCULAR | Status: AC
  Administered 2021-08-02: 1000 ug via INTRAMUSCULAR
  Filled 2021-08-02: qty 1

## 2021-08-02 MED ORDER — SODIUM CHLORIDE 0.9 % IV SOLN: Freq: Once | INTRAVENOUS | Status: AC

## 2021-08-05 ENCOUNTER — Telehealth: Payer: Self-pay | Admitting: Internal Medicine

## 2021-08-05 NOTE — Telephone Encounter (Signed)
Pt c/o Syncope: STAT if syncope occurred within 30 minutes and pt complains of lightheadedness High Priority if episode of passing out, completely, today or in last 24 hours   Did you pass out today? no   When is the last time you passed out? Yesterday, at 9am   Has this occurred multiple times? Only happened once   Did you have any symptoms prior to passing out? State he got weak, and swimmy headed.

## 2021-08-06 NOTE — Telephone Encounter (Signed)
Spoke with pt, requested he send manual transmission from his remote monitor.    Pt not at home at this time.  He will try to send a transmission when he gets home or call if he needs assistance.

## 2021-08-07 NOTE — Telephone Encounter (Signed)
Patient reports he was walking outside going to church then had syncopal event. Reports he felt lightheaded and and able lean against the door then passed out. Denies any injuries or trauma to head. Xarelto on file.  Denies any chest pain, palpitations or shortness of breath. Reports of intermittent lightheadedness since syncopal event. Reports having issues with stabilizing his glucose. Patient does not keep check of his blood pressure.  Remote transmission reviewed and no events were noted. Presenting rhythm AF w/ controlled VR's. Pt has hx of AF and on xarelto. Advised patient to call PCP to advise and ED precautions reviewed with verbal understanding.

## 2021-08-09 ENCOUNTER — Inpatient Hospital Stay: Payer: Medicare HMO

## 2021-08-09 ENCOUNTER — Ambulatory Visit: Payer: Medicare HMO

## 2021-08-09 ENCOUNTER — Other Ambulatory Visit: Payer: Self-pay

## 2021-08-09 VITALS — BP 127/74 | HR 77 | Temp 97.8°F | Resp 16

## 2021-08-09 DIAGNOSIS — D508 Other iron deficiency anemias: Secondary | ICD-10-CM | POA: Diagnosis not present

## 2021-08-09 DIAGNOSIS — D509 Iron deficiency anemia, unspecified: Secondary | ICD-10-CM

## 2021-08-09 MED ORDER — SODIUM CHLORIDE 0.9 % IV SOLN
200.0000 mg | Freq: Once | INTRAVENOUS | Status: AC
  Administered 2021-08-09: 200 mg via INTRAVENOUS
  Filled 2021-08-09: qty 200

## 2021-08-09 MED ORDER — SODIUM CHLORIDE 0.9 % IV SOLN: Freq: Once | INTRAVENOUS | Status: AC

## 2021-08-09 NOTE — Patient Instructions (Signed)

## 2021-09-06 ENCOUNTER — Inpatient Hospital Stay: Payer: Medicare HMO

## 2021-09-06 ENCOUNTER — Other Ambulatory Visit: Payer: Self-pay

## 2021-09-06 ENCOUNTER — Inpatient Hospital Stay: Payer: Medicare HMO | Attending: Oncology

## 2021-09-06 DIAGNOSIS — D508 Other iron deficiency anemias: Secondary | ICD-10-CM | POA: Diagnosis present

## 2021-09-06 DIAGNOSIS — D518 Other vitamin B12 deficiency anemias: Secondary | ICD-10-CM | POA: Diagnosis not present

## 2021-09-06 DIAGNOSIS — D509 Iron deficiency anemia, unspecified: Secondary | ICD-10-CM

## 2021-09-06 MED ORDER — CYANOCOBALAMIN 1000 MCG/ML IJ SOLN
1000.0000 ug | Freq: Once | INTRAMUSCULAR | Status: AC
  Administered 2021-09-06: 1000 ug via INTRAMUSCULAR
  Filled 2021-09-06: qty 1

## 2021-10-01 ENCOUNTER — Ambulatory Visit (INDEPENDENT_AMBULATORY_CARE_PROVIDER_SITE_OTHER): Payer: Medicare HMO

## 2021-10-01 DIAGNOSIS — I428 Other cardiomyopathies: Secondary | ICD-10-CM | POA: Diagnosis not present

## 2021-10-01 LAB — CUP PACEART REMOTE DEVICE CHECK
Battery Remaining Longevity: 32 mo
Battery Remaining Percentage: 35 %
Battery Voltage: 2.86 V
Brady Statistic RV Percent Paced: 27 %
Date Time Interrogation Session: 20230103141705
HighPow Impedance: 57 Ohm
HighPow Impedance: 57 Ohm
Implantable Lead Implant Date: 20090708
Implantable Lead Implant Date: 20090708
Implantable Lead Location: 753859
Implantable Lead Location: 753860
Implantable Lead Model: 7122
Implantable Pulse Generator Implant Date: 20170106
Lead Channel Impedance Value: 340 Ohm
Lead Channel Pacing Threshold Amplitude: 1 V
Lead Channel Pacing Threshold Pulse Width: 0.9 ms
Lead Channel Sensing Intrinsic Amplitude: 8.1 mV
Lead Channel Setting Pacing Amplitude: 2.5 V
Lead Channel Setting Pacing Pulse Width: 0.9 ms
Lead Channel Setting Sensing Sensitivity: 0.5 mV
Pulse Gen Serial Number: 7306179

## 2021-10-04 ENCOUNTER — Inpatient Hospital Stay: Payer: Medicare HMO | Attending: Oncology

## 2021-10-04 ENCOUNTER — Other Ambulatory Visit: Payer: Self-pay

## 2021-10-04 ENCOUNTER — Ambulatory Visit: Payer: Medicare HMO

## 2021-10-04 DIAGNOSIS — D518 Other vitamin B12 deficiency anemias: Secondary | ICD-10-CM | POA: Diagnosis present

## 2021-10-04 DIAGNOSIS — D509 Iron deficiency anemia, unspecified: Secondary | ICD-10-CM

## 2021-10-04 DIAGNOSIS — D508 Other iron deficiency anemias: Secondary | ICD-10-CM | POA: Diagnosis present

## 2021-10-04 MED ORDER — CYANOCOBALAMIN 1000 MCG/ML IJ SOLN
1000.0000 ug | Freq: Once | INTRAMUSCULAR | Status: AC
  Administered 2021-10-04: 1000 ug via INTRAMUSCULAR
  Filled 2021-10-04: qty 1

## 2021-10-10 NOTE — Progress Notes (Signed)
Remote ICD transmission.   

## 2021-11-04 ENCOUNTER — Telehealth: Payer: Self-pay

## 2021-11-04 NOTE — Telephone Encounter (Signed)
Patient is scheduled for in-clinic device appt  11/05/21 to bring in monitor for troubleshooting, tech support assistance was unsuccessful

## 2021-11-04 NOTE — Telephone Encounter (Signed)
Patient called in stating in he felt a thump in his chest on Friday 11/01/2021. He states his  bp was high at the time he felt the thump. Patient states he feels better he just wants to make sure nothing was seen on his device.

## 2021-11-05 ENCOUNTER — Other Ambulatory Visit: Payer: Self-pay

## 2021-11-05 ENCOUNTER — Ambulatory Visit (INDEPENDENT_AMBULATORY_CARE_PROVIDER_SITE_OTHER): Payer: Medicare HMO

## 2021-11-05 DIAGNOSIS — I428 Other cardiomyopathies: Secondary | ICD-10-CM

## 2021-11-05 LAB — CUP PACEART INCLINIC DEVICE CHECK
Battery Remaining Longevity: 33 mo
Brady Statistic RA Percent Paced: 0 %
Brady Statistic RV Percent Paced: 28 %
Date Time Interrogation Session: 20230207145016
HighPow Impedance: 63 Ohm
Implantable Lead Implant Date: 20090708
Implantable Lead Implant Date: 20090708
Implantable Lead Location: 753859
Implantable Lead Location: 753860
Implantable Lead Model: 7122
Implantable Pulse Generator Implant Date: 20170106
Lead Channel Impedance Value: 325 Ohm
Lead Channel Impedance Value: 350 Ohm
Lead Channel Pacing Threshold Amplitude: 1 V
Lead Channel Pacing Threshold Pulse Width: 0.9 ms
Lead Channel Sensing Intrinsic Amplitude: 3.6 mV
Lead Channel Sensing Intrinsic Amplitude: 8.3 mV
Lead Channel Setting Pacing Amplitude: 2.5 V
Lead Channel Setting Pacing Pulse Width: 0.9 ms
Lead Channel Setting Sensing Sensitivity: 0.5 mV
Pulse Gen Serial Number: 7306179

## 2021-11-05 NOTE — Progress Notes (Signed)
ICD check in clinic due to remote monitor failure. Normal device function. Thresholds and sensing consistent with previous device measurements. Impedance trends stable over time. No mode switches. No ventricular arrhythmias. Histogram distribution appropriate for patient and level of activity. No changes made this session. Device programmed at appropriate safety margins. Device programmed to optimize intrinsic conduction. Estimated longevity 2 years 9 months. Pt enrolled in remote follow-up, monitor fixed today. Patient education completed including shock plan. Auditory/vibratory alert demonstrated.

## 2021-11-08 ENCOUNTER — Ambulatory Visit: Payer: Medicare HMO

## 2021-11-08 ENCOUNTER — Inpatient Hospital Stay: Payer: Medicare HMO | Attending: Oncology

## 2021-11-08 DIAGNOSIS — D518 Other vitamin B12 deficiency anemias: Secondary | ICD-10-CM | POA: Insufficient documentation

## 2021-11-08 DIAGNOSIS — D508 Other iron deficiency anemias: Secondary | ICD-10-CM | POA: Insufficient documentation

## 2021-11-14 ENCOUNTER — Inpatient Hospital Stay: Payer: Medicare HMO

## 2021-11-21 ENCOUNTER — Inpatient Hospital Stay (HOSPITAL_BASED_OUTPATIENT_CLINIC_OR_DEPARTMENT_OTHER): Payer: Medicare HMO

## 2021-11-21 ENCOUNTER — Other Ambulatory Visit: Payer: Self-pay

## 2021-11-21 DIAGNOSIS — D518 Other vitamin B12 deficiency anemias: Secondary | ICD-10-CM | POA: Diagnosis present

## 2021-11-21 DIAGNOSIS — D508 Other iron deficiency anemias: Secondary | ICD-10-CM | POA: Diagnosis present

## 2021-11-21 DIAGNOSIS — D509 Iron deficiency anemia, unspecified: Secondary | ICD-10-CM

## 2021-11-21 MED ORDER — CYANOCOBALAMIN 1000 MCG/ML IJ SOLN
1000.0000 ug | Freq: Once | INTRAMUSCULAR | Status: AC
  Administered 2021-11-21: 1000 ug via INTRAMUSCULAR
  Filled 2021-11-21: qty 1

## 2021-12-04 ENCOUNTER — Other Ambulatory Visit: Payer: Self-pay | Admitting: Internal Medicine

## 2021-12-05 NOTE — Telephone Encounter (Signed)
Prescription refill request for Xarelto received.  ?Indication:cva, afib  ?Last office visit: 03/08/2021, [name] ?Weight: 99.7 kg  ?Age: 76 yo  ?Scr: 1.15, 11/08/2020 ?CrCl:  79 ml/min ? ?Pt is overdue for blood work but is to get blood work on 4/7. Will send in for a 1 month supply so that pt does not run out.  ?

## 2021-12-06 ENCOUNTER — Ambulatory Visit: Payer: Medicare HMO

## 2021-12-06 ENCOUNTER — Other Ambulatory Visit: Payer: Self-pay

## 2021-12-06 ENCOUNTER — Inpatient Hospital Stay: Payer: Medicare HMO | Attending: Oncology

## 2021-12-06 DIAGNOSIS — D518 Other vitamin B12 deficiency anemias: Secondary | ICD-10-CM | POA: Insufficient documentation

## 2021-12-06 DIAGNOSIS — D509 Iron deficiency anemia, unspecified: Secondary | ICD-10-CM

## 2021-12-06 DIAGNOSIS — D508 Other iron deficiency anemias: Secondary | ICD-10-CM | POA: Insufficient documentation

## 2021-12-06 MED ORDER — CYANOCOBALAMIN 1000 MCG/ML IJ SOLN
1000.0000 ug | Freq: Once | INTRAMUSCULAR | Status: AC
  Administered 2021-12-06: 1000 ug via INTRAMUSCULAR
  Filled 2021-12-06: qty 1

## 2021-12-22 ENCOUNTER — Emergency Department (HOSPITAL_COMMUNITY)
Admission: EM | Admit: 2021-12-22 | Discharge: 2021-12-22 | Disposition: A | Discharge status: Home / Self Care | Payer: Medicare HMO | Attending: Emergency Medicine | Admitting: Emergency Medicine

## 2021-12-22 ENCOUNTER — Emergency Department (HOSPITAL_COMMUNITY): Payer: Medicare HMO

## 2021-12-22 ENCOUNTER — Encounter (HOSPITAL_COMMUNITY): Payer: Self-pay

## 2021-12-22 ENCOUNTER — Other Ambulatory Visit: Payer: Self-pay

## 2021-12-22 DIAGNOSIS — Z7984 Long term (current) use of oral hypoglycemic drugs: Secondary | ICD-10-CM | POA: Diagnosis not present

## 2021-12-22 DIAGNOSIS — F17200 Nicotine dependence, unspecified, uncomplicated: Secondary | ICD-10-CM | POA: Diagnosis not present

## 2021-12-22 DIAGNOSIS — Z951 Presence of aortocoronary bypass graft: Secondary | ICD-10-CM | POA: Insufficient documentation

## 2021-12-22 DIAGNOSIS — E119 Type 2 diabetes mellitus without complications: Secondary | ICD-10-CM | POA: Insufficient documentation

## 2021-12-22 DIAGNOSIS — M545 Low back pain, unspecified: Secondary | ICD-10-CM | POA: Diagnosis present

## 2021-12-22 DIAGNOSIS — Z7901 Long term (current) use of anticoagulants: Secondary | ICD-10-CM | POA: Insufficient documentation

## 2021-12-22 DIAGNOSIS — I5022 Chronic systolic (congestive) heart failure: Secondary | ICD-10-CM | POA: Diagnosis not present

## 2021-12-22 DIAGNOSIS — Z95810 Presence of automatic (implantable) cardiac defibrillator: Secondary | ICD-10-CM | POA: Diagnosis not present

## 2021-12-22 DIAGNOSIS — I2510 Atherosclerotic heart disease of native coronary artery without angina pectoris: Secondary | ICD-10-CM | POA: Insufficient documentation

## 2021-12-22 DIAGNOSIS — I110 Hypertensive heart disease with heart failure: Secondary | ICD-10-CM | POA: Insufficient documentation

## 2021-12-22 DIAGNOSIS — Z79899 Other long term (current) drug therapy: Secondary | ICD-10-CM | POA: Diagnosis not present

## 2021-12-22 DIAGNOSIS — M5442 Lumbago with sciatica, left side: Secondary | ICD-10-CM | POA: Insufficient documentation

## 2021-12-22 LAB — URINALYSIS, ROUTINE W REFLEX MICROSCOPIC
Bilirubin Urine: NEGATIVE
Glucose, UA: 50 mg/dL — AB
Hgb urine dipstick: NEGATIVE
Ketones, ur: NEGATIVE mg/dL
Leukocytes,Ua: NEGATIVE
Nitrite: NEGATIVE
Protein, ur: NEGATIVE mg/dL
Specific Gravity, Urine: 1.016 (ref 1.005–1.030)
pH: 6 (ref 5.0–8.0)

## 2021-12-22 MED ORDER — ACETAMINOPHEN 500 MG PO TABS
1000.0000 mg | ORAL_TABLET | Freq: Once | ORAL | Status: AC
  Administered 2021-12-22: 1000 mg via ORAL
  Filled 2021-12-22: qty 2

## 2021-12-22 MED ORDER — CYCLOBENZAPRINE HCL 10 MG PO TABS: 10.0000 mg | ORAL_TABLET | Freq: Two times a day (BID) | ORAL | 0 refills | Status: DC | PRN

## 2021-12-22 MED ORDER — ACETAMINOPHEN 500 MG PO TABS: 500.0000 mg | ORAL_TABLET | Freq: Four times a day (QID) | ORAL | 0 refills | Status: DC | PRN

## 2021-12-22 NOTE — ED Provider Notes (Signed)
?[facility] EMERGENCY DEPARTMENT ?Provider Note ? ? ?CSN: [id number] ?Arrival date & time: 12/22/21  0458 ? ?  ? ?History ? ?Chief Complaint  ?Patient presents with  ? Back Pain  ? ? ?[name] is a 76 y.o. male. ? ?HPI ? ?  ? ?This is a 76 year old male who presents with back pain.  Patient reports 1 week history of worsening lower back pain.  He states that he is at the left lower back and at times radiates down his left leg.  Denies any injury or heavy lifting.  He does state that the pain is worse when he moves from sitting to standing.  He has not really taken anything at home for his pain.  Denies weakness or numbness of the lower extremities.  Denies bowel or bladder difficulty.  No fever or history of cancer. ? ?Home Medications ?Prior to Admission medications   ?Medication Sig Start Date End Date Taking? Authorizing Provider  ?acetaminophen (TYLENOL) 500 MG tablet Take 1 tablet (500 mg total) by mouth every 6 (six) hours as needed. 12/22/21  Yes Abbigail Anstey, Mayer Masker, MD  ?cyclobenzaprine (FLEXERIL) 10 MG tablet Take 1 tablet (10 mg total) by mouth 2 (two) times daily as needed for muscle spasms. 12/22/21  Yes Genasis Zingale, Mayer Masker, MD  ?atorvastatin (LIPITOR) 40 MG tablet  07/16/20   [provider]  ?baclofen (LIORESAL) 20 MG tablet Take 40 mg by mouth at bedtime.     [provider]  ?carvedilol (COREG) 25 MG tablet Take 1 tablet (25 mg total) by mouth 2 (two) times daily with a meal. 09/12/20   Marinus Maw, MD  ?doxycycline (VIBRA-TABS) 100 MG tablet Take 1 tablet (100 mg total) by mouth 2 (two) times daily. 04/02/21   Park Liter, DPM  ?DULoxetine (CYMBALTA) 30 MG capsule duloxetine 30 mg capsule,delayed release ? Take 1 capsule every day by oral route.    [provider]  ?furosemide (LASIX) 40 MG tablet Take 40 mg by mouth daily as needed for fluid.     [provider]  ?glimepiride (AMARYL) 4 MG tablet Take 4 mg by mouth daily with breakfast.    [provider]  ?linagliptin (TRADJENTA) 5 MG TABS tablet Take 5 mg by mouth daily after breakfast.     [provider]  ?meclizine (ANTIVERT) 25 MG tablet Take 25 mg by mouth 4 (four) times daily as needed. For dizziness 02/26/12   [provider]  ?metFORMIN (GLUCOPHAGE) 500 MG tablet  08/26/20   [provider]  ?oxyCODONE-acetaminophen (PERCOCET/ROXICET) 5-325 MG tablet Take a half tablet every 6 hours 04/18/19   [provider]  ?Lawana Chambers 100-33 UNT-MCG/ML SOPN  07/17/20   [provider]  ?spironolactone (ALDACTONE) 25 MG tablet Take 1 tablet (25 mg total) by mouth every morning. 07/20/14   Marinus Maw, MD  ?tamsulosin (FLOMAX) 0.4 MG CAPS capsule tamsulosin 0.4 mg capsule ? TAKE 1 CAPSULE BY MOUTH ONCE DAILY    [provider]  ?triamcinolone cream (KENALOG) 0.5 % triamcinolone acetonide 0.5 % topical cream ? APPLY CREAM TOPICALLY TO AFFECTED AREA TWICE DAILY    [provider]  ?XARELTO 20 MG TABS tablet TAKE 1 TABLET BY MOUTH DAILY WITH SUPPER 12/05/21   Marinus Maw, MD  ?   ? ?Allergies    ?Codeine   ? ?Review of Systems   ?Review of Systems  ?Constitutional:  Negative for fever.  ?Musculoskeletal:  Positive for back pain.  ?  Neurological:  Negative for weakness and numbness.  ?All other systems reviewed and are negative. ? ?Physical Exam ?Updated Vital Signs ?BP (!) 155/91   Pulse 80   Temp 97.8 ?F (36.6 ?C) (Oral)   Resp 20   Ht 1.88 m (6\' 2" )   Wt 94.3 kg   SpO2 96%   BMI 26.71 kg/m?  ?Physical Exam ?Vitals and nursing note reviewed.  ?Constitutional:   ?   Appearance: He is well-developed. He is not ill-appearing.  ?HENT:  ?   Head: Normocephalic and atraumatic.  ?   Mouth/Throat:  ?   Mouth: Mucous membranes are moist.  ?Eyes:  ?   Pupils: Pupils are equal, round, and reactive to light.  ?Cardiovascular:  ?   Rate and Rhythm: Normal rate and regular rhythm.  ?   Heart sounds: Normal heart sounds. No murmur heard. ?Pulmonary:  ?    Effort: Pulmonary effort is normal. No respiratory distress.  ?   Breath sounds: Normal breath sounds. No wheezing.  ?Abdominal:  ?   General: Bowel sounds are normal.  ?   Palpations: Abdomen is soft.  ?   Tenderness: There is no abdominal tenderness. There is no rebound.  ?Musculoskeletal:  ?   Cervical back: Neck supple.  ?   Comments: No reproducible tenderness to palpation, positive left straight leg raise  ?Lymphadenopathy:  ?   Cervical: No cervical adenopathy.  ?Skin: ?   General: Skin is warm and dry.  ?Neurological:  ?   Mental Status: He is alert and oriented to person, place, and time.  ?   Comments: 5 out of 5 strength bilateral lower extremities  ?Psychiatric:     ?   Mood and Affect: Mood normal.  ? ? ?ED Results / Procedures / Treatments   ?Labs ?(all labs ordered are listed, but only abnormal results are displayed) ?Labs Reviewed  ?URINALYSIS, ROUTINE W REFLEX MICROSCOPIC - Abnormal; Notable for the following components:  ?    Result Value  ? Glucose, UA 50 (*)   ? All other components within normal limits  ? ? ?EKG ?None ? ?Radiology ?DG Lumbar Spine Complete ? 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 Generalized disc space narrowing and endplate spurring with mild scoliosis. Discs appear collapsed at L4-5 and L5-S1. Lower lumbar facet spurring. No evidence of fracture or bone lesion. 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 No acute finding. Electronically Signed   By: 03/14/2018 M.D.   On: 12/22/2021 06:21   ? ?Procedures ?Procedures  ? ? ?Medications Ordered in ED ?Medications  ?acetaminophen (TYLENOL) tablet 1,000 mg (1,000 mg Oral Given 12/22/21 0536)  ? ? ?ED Course/ Medical Decision Making/ A&P ?Clinical Course as of 12/22/21  0654  ?12/24/21 Dec 22, 2021  ?Dec 24, 2021 Patient states he feels better after Tylenol.  Will discharge with Tylenol and short course of Flexeril. [CH]  ?  ?Clinical Course User Index ?[CH] Savina Olshefski, [id number], MD  ? ?                        ?Medical Decision Making ?Amount and/or Complexity of Data Reviewed ?Labs: ordered. ?Radiology: ordered. ? ?Risk ?OTC drugs. ?Prescription drug management. ? ? ?This patient presents to the ED  for concern of back pain, this involves an extensive number of treatment options, and is a complaint that carries with it a high risk of complications and morbidity.  The differential diagnosis includes strain, sciatica, urinary tract infection, kidney stone ? ?MDM:   ? ?This is a 76 year old male who presents with back pain.  Denies any injury but does state that it is worse with movement.  He reports getting up from a recliner with sudden onset of pain.  He is nontoxic and vital signs are reassuring.  No signs or symptoms of cauda equina.  He is neurovascularly intact.  There are some features of the pain that are suggestive of sciatica.  Patient was given 1 g of Tylenol.  X-rays obtained and show lumbar degenerative changes.  Urinalysis without evidence of urinary tract infection.  On recheck, patient states he feels much better.  In the absence of hard signs of cord compression or other mass lesion, would treat conservatively.  Given his age would opt for Tylenol and short course of muscle relaxers.  Patient is agreeable to plan. ?(Labs, imaging) ? ?Labs: ?I Ordered, and personally interpreted labs.  The pertinent results include: Urinalysis negative ? ?Imaging Studies ordered: ?I ordered imaging studies including lumbar x-ray with degenerative changes ?I independently visualized and interpreted imaging. ?I agree with the radiologist interpretation ? ?Additional history obtained from chart review.  External records from outside source obtained and reviewed including prior lab ? ?Critical  Interventions: ?Tylenol ? ?Consultations: ?I requested consultation with the NA,  and discussed lab and imaging findings as well as pertinent plan - they recommend: N/A ? ?Cardiac Monitoring: ?The patient was maintaine

## 2021-12-22 NOTE — ED Notes (Signed)
ED Provider at bedside. 

## 2021-12-22 NOTE — ED Triage Notes (Signed)
Pt arrived via POV c/o worsening lower left back pain radiating down left leg. Pt denies injury or trauma. Pt reports getting up from recliner at home X 1 week ago when pain began suddenly. Pt attempted cold therapy at home without relief. ?

## 2021-12-22 NOTE — Discharge Instructions (Addendum)
You were seen today for back pain.  Some of your pain suggest that it may be a nerve related issue.  Start Tylenol 500 mg every 8 hours as needed.  You may also take Flexeril.  Be careful when taking Flexeril as this can sometimes make you sleepy or dizzy. ?

## 2021-12-28 ENCOUNTER — Other Ambulatory Visit: Payer: Self-pay | Admitting: Internal Medicine

## 2021-12-30 NOTE — Telephone Encounter (Signed)
Received labwork from pt's PCP 11/26/21 Creat 0.97, Hgb 13.8, age 76, weight 94.3kg, CrCl 89.12, based on CrCl pt is on appropriate dosage of Xarelto 20mg  QD.  Will refill rx.  ?

## 2021-12-30 NOTE — Telephone Encounter (Signed)
Pt last saw Dr Ladona Ridgel 03/08/21, last labs 11/08/20 Creat 1.15, pt is overdue for labwork.  Will need labwork for refill.  Will call PCP to see if they have more recent labwork. ?

## 2021-12-30 NOTE — Telephone Encounter (Signed)
Called pt's PCP to request most recent labwork.  They have labwork from 10/2021 will fax those results to our office.  Will await faxed labwork results to refill rx.  ?

## 2021-12-31 ENCOUNTER — Ambulatory Visit (INDEPENDENT_AMBULATORY_CARE_PROVIDER_SITE_OTHER): Payer: Medicare HMO

## 2021-12-31 DIAGNOSIS — I428 Other cardiomyopathies: Secondary | ICD-10-CM

## 2022-01-02 ENCOUNTER — Other Ambulatory Visit: Payer: Self-pay | Admitting: Oncology

## 2022-01-02 DIAGNOSIS — D509 Iron deficiency anemia, unspecified: Secondary | ICD-10-CM

## 2022-01-02 LAB — CUP PACEART REMOTE DEVICE CHECK
Battery Remaining Longevity: 29 mo
Battery Remaining Percentage: 32 %
Battery Voltage: 2.84 V
Brady Statistic RV Percent Paced: 30 %
Date Time Interrogation Session: 20230405175201
HighPow Impedance: 73 Ohm
HighPow Impedance: 73 Ohm
Implantable Lead Implant Date: 20090708
Implantable Lead Implant Date: 20090708
Implantable Lead Location: 753859
Implantable Lead Location: 753860
Implantable Lead Model: 7122
Implantable Pulse Generator Implant Date: 20170106
Lead Channel Impedance Value: 390 Ohm
Lead Channel Pacing Threshold Amplitude: 1 V
Lead Channel Pacing Threshold Pulse Width: 0.9 ms
Lead Channel Sensing Intrinsic Amplitude: 8.3 mV
Lead Channel Setting Pacing Amplitude: 2.5 V
Lead Channel Setting Pacing Pulse Width: 0.9 ms
Lead Channel Setting Sensing Sensitivity: 0.5 mV
Pulse Gen Serial Number: 7306179

## 2022-01-03 ENCOUNTER — Other Ambulatory Visit: Payer: Self-pay

## 2022-01-03 ENCOUNTER — Inpatient Hospital Stay: Payer: Medicare HMO | Attending: Oncology

## 2022-01-03 ENCOUNTER — Inpatient Hospital Stay: Payer: Medicare HMO | Admitting: Oncology

## 2022-01-03 ENCOUNTER — Inpatient Hospital Stay: Payer: Medicare HMO

## 2022-01-03 VITALS — BP 101/64 | HR 85 | Temp 97.7°F | Resp 18 | Wt 214.1 lb

## 2022-01-03 DIAGNOSIS — D518 Other vitamin B12 deficiency anemias: Secondary | ICD-10-CM | POA: Diagnosis present

## 2022-01-03 DIAGNOSIS — D508 Other iron deficiency anemias: Secondary | ICD-10-CM | POA: Insufficient documentation

## 2022-01-03 DIAGNOSIS — D509 Iron deficiency anemia, unspecified: Secondary | ICD-10-CM

## 2022-01-03 DIAGNOSIS — E538 Deficiency of other specified B group vitamins: Secondary | ICD-10-CM

## 2022-01-03 LAB — CBC WITH DIFFERENTIAL (CANCER CENTER ONLY)
Abs Immature Granulocytes: 0.01 10*3/uL (ref 0.00–0.07)
Basophils Absolute: 0 10*3/uL (ref 0.0–0.1)
Basophils Relative: 0 %
Eosinophils Absolute: 0.2 10*3/uL (ref 0.0–0.5)
Eosinophils Relative: 3 %
HCT: 41 % (ref 39.0–52.0)
Hemoglobin: 13.7 g/dL (ref 13.0–17.0)
Immature Granulocytes: 0 %
Lymphocytes Relative: 23 %
Lymphs Abs: 1.4 10*3/uL (ref 0.7–4.0)
MCH: 29.3 pg (ref 26.0–34.0)
MCHC: 33.4 g/dL (ref 30.0–36.0)
MCV: 87.8 fL (ref 80.0–100.0)
Monocytes Absolute: 0.7 10*3/uL (ref 0.1–1.0)
Monocytes Relative: 11 %
Neutro Abs: 3.9 10*3/uL (ref 1.7–7.7)
Neutrophils Relative %: 63 %
Platelet Count: 190 10*3/uL (ref 150–400)
RBC: 4.67 MIL/uL (ref 4.22–5.81)
RDW: 14.8 % (ref 11.5–15.5)
WBC Count: 6.2 10*3/uL (ref 4.0–10.5)
nRBC: 0 % (ref 0.0–0.2)

## 2022-01-03 LAB — VITAMIN B12: Vitamin B-12: 393 pg/mL (ref 180–914)

## 2022-01-03 LAB — IRON AND IRON BINDING CAPACITY (CC-WL,HP ONLY)
Iron: 61 ug/dL (ref 45–182)
Saturation Ratios: 18 % (ref 17.9–39.5)
TIBC: 349 ug/dL (ref 250–450)
UIBC: 288 ug/dL (ref 117–376)

## 2022-01-03 LAB — FERRITIN: Ferritin: 85 ng/mL (ref 24–336)

## 2022-01-03 MED ORDER — CYANOCOBALAMIN 1000 MCG/ML IJ SOLN
1000.0000 ug | Freq: Once | INTRAMUSCULAR | Status: AC
  Administered 2022-01-03: 1000 ug via INTRAMUSCULAR
  Filled 2022-01-03: qty 1

## 2022-01-03 NOTE — Patient Instructions (Signed)
Vitamin B12 Deficiency ?Vitamin B12 deficiency occurs when the body does not have enough vitamin B12, which is an important vitamin. The body needs this vitamin: ?To make red blood cells. ?To make DNA. This is the genetic material inside cells. ?To help the nerves work properly so they can carry messages from the brain to the body. ?Vitamin B12 deficiency can cause various health problems, such as a low red blood cell count (anemia) or nerve damage. ?What are the causes? ?This condition may be caused by: ?Not eating enough foods that contain vitamin B12. ?Not having enough stomach acid and digestive fluids to properly absorb vitamin B12 from the food that you eat. ?Certain digestive system diseases that make it hard to absorb vitamin B12. These diseases include Crohn's disease, chronic pancreatitis, and cystic fibrosis. ?A condition in which the body does not make enough of a protein (intrinsic factor), resulting in too few red blood cells (pernicious anemia). ?Having a surgery in which part of the stomach or small intestine is removed. ?Taking certain medicines that make it hard for the body to absorb vitamin B12. These medicines include: ?Heartburn medicines (antacids and proton pump inhibitors). ?Certain antibiotic medicines. ?Some medicines that are used to treat diabetes, tuberculosis, gout, or high cholesterol. ?What increases the risk? ?The following factors may make you more likely to develop a B12 deficiency: ?Being older than age 50. ?Eating a vegetarian or vegan diet, especially while you are pregnant. ?Eating a poor diet while you are pregnant. ?Taking certain medicines. ?Having alcoholism. ?What are the signs or symptoms? ?In some cases, there are no symptoms of this condition. If the condition leads to anemia or nerve damage, various symptoms can occur, such as: ?Weakness. ?Fatigue. ?Loss of appetite. ?Weight loss. ?Numbness or tingling in your hands and feet. ?Redness and burning of the  tongue. ?Confusion or memory problems. ?Depression. ?Sensory problems, such as color blindness, ringing in the ears, or loss of taste. ?Diarrhea or constipation. ?Trouble walking. ?If anemia is severe, symptoms can include: ?Shortness of breath. ?Dizziness. ?Rapid heart rate (tachycardia). ?How is this diagnosed? ?This condition may be diagnosed with a blood test to measure the level of vitamin B12 in your blood. You may also have other tests, including: ?A group of tests that measure certain characteristics of blood cells (complete blood count, CBC). ?A blood test to measure intrinsic factor. ?A procedure where a thin tube with a camera on the end is used to look into your stomach or intestines (endoscopy). ?Other tests may be needed to discover the cause of B12 deficiency. ?How is this treated? ?Treatment for this condition depends on the cause. This condition may be treated by: ?Changing your eating and drinking habits, such as: ?Eating more foods that contain vitamin B12. ?Drinking less alcohol or no alcohol. ?Getting vitamin B12 injections. ?Taking vitamin B12 supplements. Your health care provider will tell you which dosage is best for you. ?Follow these instructions at home: ?Eating and drinking ? ?Eat lots of healthy foods that contain vitamin B12, including: ?Meats and poultry. This includes beef, pork, chicken, turkey, and organ meats, such as liver. ?Seafood. This includes clams, rainbow trout, salmon, tuna, and haddock. ?Eggs. ?Cereal and dairy products that are fortified. This means that vitamin B12 has been added to the food. Check the label on the package to see if the food is fortified. ?The items listed above may not be a complete list of recommended foods and beverages. Contact a dietitian for more information. ?General instructions ?Get any   injections that are prescribed by your health care provider. ?Take supplements only as told by your health care provider. Follow the directions carefully. ?Do  not drink alcohol if your health care provider tells you not to. In some cases, you may only be asked to limit alcohol use. ?Keep all follow-up visits as told by your health care provider. This is important. ?Contact a health care provider if: ?Your symptoms come back. ?Get help right away if you: ?Develop shortness of breath. ?Have a rapid heart rate. ?Have chest pain. ?Become dizzy or lose consciousness. ?Summary ?Vitamin B12 deficiency occurs when the body does not have enough vitamin B12. ?The main causes of vitamin B12 deficiency include dietary deficiency, digestive diseases, pernicious anemia, and having a surgery in which part of the stomach or small intestine is removed. ?In some cases, there are no symptoms of this condition. If the condition leads to anemia or nerve damage, various symptoms can occur, such as weakness, shortness of breath, and numbness. ?Treatment may include getting vitamin B12 injections or taking vitamin B12 supplements. Eat lots of healthy foods that contain vitamin B12. ?This information is not intended to replace advice given to you by your health care provider. Make sure you discuss any questions you have with your health care provider. ?Document Revised: 03/13/2021 Document Reviewed: 05/25/2018 ?Elsevier Patient Education ? 2022 [employer] ? ?

## 2022-01-03 NOTE — Progress Notes (Signed)
[name] ?HEMATOLOGY OFFICE PROGRESS NOTE ?01/03/22 ? ? ? ?DIAGNOSIS: 76 year old man with B12 deficiency diagnosed in 2008.  He has also element of iron deficiency related to poor absorption.   ? ? ?CURRENT THERAPY:  ? ? B-12 1000 mcg IM injection monthly started in 2008. ? ?IV iron infusion when needed.  He received iron sucrose completed in November 2022 for a total of 1000 mg. ? ?INTERVAL HISTORY: ? ?[name] returns today for a follow-up visit.  Since last visit, he reports feeling well without any major complaints.  He tolerated iron infusion without complications at this time.  He denies any nausea, vomiting or abdominal pain.  He denies any excessive fatigue or tiredness.  He denies any hematochezia or melena. ? ? ? ? ? ?MEDICATIONS: Reviewed without changes. ? ?Current Outpatient Medications  ?Medication Sig Dispense Refill  ? acetaminophen (TYLENOL) 500 MG tablet Take 1 tablet (500 mg total) by mouth every 6 (six) hours as needed. 30 tablet 0  ? atorvastatin (LIPITOR) 40 MG tablet     ? baclofen (LIORESAL) 20 MG tablet Take 40 mg by mouth at bedtime.     ? carvedilol (COREG) 25 MG tablet Take 1 tablet (25 mg total) by mouth 2 (two) times daily with a meal. 180 tablet 3  ? cyclobenzaprine (FLEXERIL) 10 MG tablet Take 1 tablet (10 mg total) by mouth 2 (two) times daily as needed for muscle spasms. 20 tablet 0  ? doxycycline (VIBRA-TABS) 100 MG tablet Take 1 tablet (100 mg total) by mouth 2 (two) times daily. 14 tablet 0  ? DULoxetine (CYMBALTA) 30 MG capsule duloxetine 30 mg capsule,delayed release ? Take 1 capsule every day by oral route.    ? furosemide (LASIX) 40 MG tablet Take 40 mg by mouth daily as needed for fluid.     ? glimepiride (AMARYL) 4 MG tablet Take 4 mg by mouth daily with breakfast.    ? linagliptin (TRADJENTA) 5 MG TABS tablet Take 5 mg by mouth daily after breakfast.     ? meclizine (ANTIVERT) 25 MG tablet Take 25 mg by mouth 4 (four) times daily as needed. For  dizziness    ? metFORMIN (GLUCOPHAGE) 500 MG tablet     ? oxyCODONE-acetaminophen (PERCOCET/ROXICET) 5-325 MG tablet Take a half tablet every 6 hours    ? rivaroxaban (XARELTO) 20 MG TABS tablet TAKE 1 TABLET BY MOUTH ONCE DAILY WITH SUPPER 30 tablet 5  ? SOLIQUA 100-33 UNT-MCG/ML SOPN     ? spironolactone (ALDACTONE) 25 MG tablet Take 1 tablet (25 mg total) by mouth every morning. 90 tablet 3  ? tamsulosin (FLOMAX) 0.4 MG CAPS capsule tamsulosin 0.4 mg capsule ? TAKE 1 CAPSULE BY MOUTH ONCE DAILY    ? triamcinolone cream (KENALOG) 0.5 % triamcinolone acetonide 0.5 % topical cream ? APPLY CREAM TOPICALLY TO AFFECTED AREA TWICE DAILY    ? ?No current facility-administered medications for this visit.  ? ? ? ?PHYSICAL EXAMINATION: ? ? ?Blood pressure 101/64, pulse 85, temperature 97.7 ?F (36.5 ?C), resp. rate 18, weight 214 lb 1.6 oz (97.1 kg), SpO2 98 %. ? ? ? ? ?ECOG PERFORMANCE STATUS: 1 ?  ? ? ?General appearance: Alert, awake without any distress. ?Head: Atraumatic without abnormalities ?Oropharynx: Without any thrush or ulcers. ?Eyes: No scleral icterus. ?Lymph nodes: No lymphadenopathy noted in the cervical, supraclavicular, or axillary nodes ?Heart:regular rate and rhythm, without any murmurs or gallops.   ?Lung: Clear to auscultation without any rhonchi, wheezes  or dullness to percussion. ?Abdomin: Soft, nontender without any shifting dullness or ascites. ?Musculoskeletal: No clubbing or cyanosis. ?Neurological: No motor or sensory deficits. ?Skin: No rashes or lesions. ? ? ? ? ? ? ? ? ?  ?Labs:  ?Lab Results  ?Component Value Date  ? WBC 6.1 07/05/2021  ? HGB 12.5 (L) 07/05/2021  ? HCT 37.7 (L) 07/05/2021  ? MCV 83.8 07/05/2021  ? PLT 204 07/05/2021  ? NEUTROABS 3.7 07/05/2021  ? ? ?  Chemistry   ?   ?Component Value Date/Time  ? NA 141 11/08/2020 1415  ? NA 141 06/02/2017 0952  ? K 3.7 11/08/2020 1415  ? K 4.0 06/02/2017 0952  ? CL 100 11/08/2020 1415  ? CL 100 01/06/2013 0830  ? CO2 33 (H) 11/08/2020 1415   ? CO2 28 06/02/2017 0952  ? BUN 23 11/08/2020 1415  ? BUN 15.7 06/02/2017 0952  ? CREATININE 1.15 11/08/2020 1415  ? CREATININE 0.9 06/02/2017 0952  ?    ?Component Value Date/Time  ? CALCIUM 9.1 11/08/2020 1415  ? CALCIUM 9.4 06/02/2017 0952  ? ALKPHOS 98 04/27/2019 0153  ? ALKPHOS 81 06/02/2017 0952  ? AST 15 04/27/2019 0153  ? AST 13 06/02/2017 0952  ? ALT 11 04/27/2019 0153  ? ALT 9 06/02/2017 0952  ? BILITOT 0.5 04/27/2019 0153  ? BILITOT 0.46 06/02/2017 [id number]  ?  ? ? ? ? ?Assessment and plan:  ?  ?76 year old man with: ? ? ?1.  B12 deficiency related to poor absorption noted in 2008.  ? ?He has received B12 injection on a monthly basis with excellent maintenance of his B12 levels and adequate hemoglobin.  Risks and benefits of continuing this treatment were discussed at this time.  He is agreeable to continue. ? ? ? ?2. Iron deficiency anemia related to poor iron absorption.  He received intravenous iron infusion in November 2022.  His hemoglobin today is 13.7 which should likely indicate normalization of his iron studies.  We will continue to monitor on subsequent visits. ? ? ? ? ? ?3. Follow-up: Every month for B12 injection and MD follow-up in 6 months. ? ? ?30  minutes were s dedicated to this visit.  The time was spent on reviewing laboratory data, disease status update and outlining future plan of care discussion. ? ?Zola Button MD ?01/03/22 ? ? ?

## 2022-01-15 NOTE — Progress Notes (Signed)
Remote ICD transmission.   

## 2022-01-29 ENCOUNTER — Telehealth: Payer: Self-pay | Admitting: Oncology

## 2022-01-29 NOTE — Telephone Encounter (Signed)
Per 5/3 phone lines pt called to reschedule appointment  pt moved per pt request ?  ?

## 2022-01-31 ENCOUNTER — Inpatient Hospital Stay: Payer: Medicare HMO

## 2022-01-31 ENCOUNTER — Other Ambulatory Visit: Payer: Self-pay

## 2022-01-31 ENCOUNTER — Inpatient Hospital Stay: Payer: Medicare HMO | Attending: Oncology

## 2022-01-31 DIAGNOSIS — D509 Iron deficiency anemia, unspecified: Secondary | ICD-10-CM

## 2022-01-31 DIAGNOSIS — D518 Other vitamin B12 deficiency anemias: Secondary | ICD-10-CM | POA: Insufficient documentation

## 2022-01-31 DIAGNOSIS — D508 Other iron deficiency anemias: Secondary | ICD-10-CM | POA: Diagnosis present

## 2022-01-31 MED ORDER — CYANOCOBALAMIN 1000 MCG/ML IJ SOLN
1000.0000 ug | Freq: Once | INTRAMUSCULAR | Status: AC
  Administered 2022-01-31: 1000 ug via INTRAMUSCULAR
  Filled 2022-01-31: qty 1

## 2022-03-07 ENCOUNTER — Inpatient Hospital Stay: Payer: Medicare HMO

## 2022-03-07 ENCOUNTER — Other Ambulatory Visit: Payer: Self-pay

## 2022-03-07 ENCOUNTER — Inpatient Hospital Stay: Payer: Medicare HMO | Attending: Oncology

## 2022-03-07 DIAGNOSIS — D518 Other vitamin B12 deficiency anemias: Secondary | ICD-10-CM | POA: Diagnosis present

## 2022-03-07 DIAGNOSIS — D509 Iron deficiency anemia, unspecified: Secondary | ICD-10-CM

## 2022-03-07 DIAGNOSIS — D508 Other iron deficiency anemias: Secondary | ICD-10-CM | POA: Insufficient documentation

## 2022-03-07 MED ORDER — CYANOCOBALAMIN 1000 MCG/ML IJ SOLN
1000.0000 ug | Freq: Once | INTRAMUSCULAR | Status: AC
  Administered 2022-03-07: 1000 ug via INTRAMUSCULAR
  Filled 2022-03-07: qty 1

## 2022-03-11 ENCOUNTER — Encounter: Payer: Medicare HMO | Admitting: Internal Medicine

## 2022-03-17 ENCOUNTER — Encounter: Payer: Medicare HMO | Admitting: Internal Medicine

## 2022-03-18 ENCOUNTER — Encounter: Payer: Self-pay | Admitting: Podiatry

## 2022-03-18 ENCOUNTER — Ambulatory Visit (INDEPENDENT_AMBULATORY_CARE_PROVIDER_SITE_OTHER): Payer: Medicare HMO | Admitting: Podiatry

## 2022-03-18 DIAGNOSIS — B351 Tinea unguium: Secondary | ICD-10-CM

## 2022-03-18 DIAGNOSIS — D689 Coagulation defect, unspecified: Secondary | ICD-10-CM | POA: Diagnosis not present

## 2022-03-18 DIAGNOSIS — D696 Thrombocytopenia, unspecified: Secondary | ICD-10-CM

## 2022-03-18 DIAGNOSIS — N183 Chronic kidney disease, stage 3 unspecified: Secondary | ICD-10-CM

## 2022-03-18 DIAGNOSIS — M79675 Pain in left toe(s): Secondary | ICD-10-CM | POA: Diagnosis not present

## 2022-03-18 DIAGNOSIS — E1151 Type 2 diabetes mellitus with diabetic peripheral angiopathy without gangrene: Secondary | ICD-10-CM

## 2022-03-18 DIAGNOSIS — E1169 Type 2 diabetes mellitus with other specified complication: Secondary | ICD-10-CM

## 2022-03-18 NOTE — Progress Notes (Signed)
This patient returns to my office for at risk foot care.  This patient requires this care by a professional since this patient will be at risk due to having diabetes PVD and thrombocytopenia.  patient is unable to cut nails himself since the patient cannot reach his nails.These nails are painful walking and wearing shoes.  This patient presents for at risk foot care today.  General Appearance  Alert, conversant and in no acute stress.  Vascular  Dorsalis pedis and posterior tibial  pulses are  weakly palpable  bilaterally.  Capillary return is within normal limits  bilaterally. Cold feet. bilaterally.  Neurologic  Senn-Weinstein monofilament wire test within normal limits  bilaterally. Muscle power within normal limits bilaterally.  Nails Thick disfigured discolored nails with subungual debris  from hallux to fifth toes bilaterally. No evidence of bacterial infection or drainage bilaterally.  Orthopedic  No limitations of motion  feet .  No crepitus or effusions noted.  No bony pathology or digital deformities noted.  Skin  normotropic skin with no porokeratosis noted bilaterally.  No signs of infections or ulcers noted.     Onychomycosis  Pain in right toes  Pain in left toes  Consent was obtained for treatment procedures.   Mechanical debridement of nails 1-5  bilaterally performed with a nail nipper.  Filed with dremel without incident.    Return office visit   3 months                   Told patient to return for periodic foot care and evaluation due to potential at risk complications.   Joah Patlan DPM   

## 2022-04-02 ENCOUNTER — Ambulatory Visit (INDEPENDENT_AMBULATORY_CARE_PROVIDER_SITE_OTHER): Payer: Medicare HMO

## 2022-04-02 DIAGNOSIS — I428 Other cardiomyopathies: Secondary | ICD-10-CM

## 2022-04-04 ENCOUNTER — Ambulatory Visit: Payer: Medicare HMO

## 2022-04-04 ENCOUNTER — Other Ambulatory Visit: Payer: Self-pay

## 2022-04-04 ENCOUNTER — Inpatient Hospital Stay: Payer: Medicare HMO | Attending: Oncology

## 2022-04-04 VITALS — BP 124/62 | HR 64 | Temp 97.8°F | Resp 17

## 2022-04-04 DIAGNOSIS — D518 Other vitamin B12 deficiency anemias: Secondary | ICD-10-CM | POA: Diagnosis present

## 2022-04-04 DIAGNOSIS — D508 Other iron deficiency anemias: Secondary | ICD-10-CM | POA: Diagnosis present

## 2022-04-04 DIAGNOSIS — D509 Iron deficiency anemia, unspecified: Secondary | ICD-10-CM

## 2022-04-04 MED ORDER — CYANOCOBALAMIN 1000 MCG/ML IJ SOLN
1000.0000 ug | Freq: Once | INTRAMUSCULAR | Status: AC
  Administered 2022-04-04: 1000 ug via INTRAMUSCULAR
  Filled 2022-04-04: qty 1

## 2022-04-04 NOTE — Patient Instructions (Signed)
Vitamin B12 Deficiency Vitamin B12 deficiency occurs when the body does not have enough of this important vitamin. The body needs this vitamin: To make red blood cells. To make DNA. This is the genetic material inside cells. To help the nerves work properly so they can carry messages from the brain to the body. Vitamin B12 deficiency can cause health problems, such as not having enough red blood cells in the blood (anemia). This can lead to nerve damage if untreated. What are the causes? This condition may be caused by: Not eating enough foods that contain vitamin B12. Not having enough stomach acid and digestive fluids to properly absorb vitamin B12 from the food that you eat. Having certain diseases that make it hard to absorb vitamin B12. These diseases include Crohn's disease, chronic pancreatitis, and cystic fibrosis. An autoimmune disorder in which the body does not make enough of a protein (intrinsic factor) within the stomach, resulting in not enough absorption of vitamin B12. Having a surgery in which part of the stomach or small intestine is removed. Taking certain medicines that make it hard for the body to absorb vitamin B12. These include: Heartburn medicines, such as antacids and proton pump inhibitors. Some medicines that are used to treat diabetes. What increases the risk? The following factors may make you more likely to develop a vitamin B12 deficiency: Being an older adult. Eating a vegetarian or vegan diet that does not include any foods that come from animals. Eating a poor diet while you are pregnant. Taking certain medicines. Having alcoholism. What are the signs or symptoms? In some cases, there are no symptoms of this condition. If the condition leads to anemia or nerve damage, various symptoms may occur, such as: Weakness. Tiredness (fatigue). Loss of appetite. Numbness or tingling in your hands and feet. Redness and burning of the tongue. Depression,  confusion, or memory problems. Trouble walking. If anemia is severe, symptoms can include: Shortness of breath. Dizziness. Rapid heart rate. How is this diagnosed? This condition may be diagnosed with a blood test to measure the level of vitamin B12 in your blood. You may also have other tests, including: A group of tests that measure certain characteristics of blood cells (complete blood count, CBC). A blood test to measure intrinsic factor. A procedure where a thin tube with a camera on the end is used to look into your stomach or intestines (endoscopy). Other tests may be needed to discover the cause of the deficiency. How is this treated? Treatment for this condition depends on the cause. This condition may be treated by: Changing your eating and drinking habits, such as: Eating more foods that contain vitamin B12. Drinking less alcohol or no alcohol. Getting vitamin B12 injections. Taking vitamin B12 supplements by mouth (orally). Your health care provider will tell you which dose is best for you. Follow these instructions at home: Eating and drinking  Include foods in your diet that come from animals and contain a lot of vitamin B12. These include: Meats and poultry. This includes beef, pork, chicken, turkey, and organ meats, such as liver. Seafood. This includes clams, rainbow trout, salmon, tuna, and haddock. Eggs. Dairy foods such as milk, yogurt, and cheese. Eat foods that have vitamin B12 added to them (are fortified), such as ready-to-eat breakfast cereals. Check the label on the package to see if a food is fortified. The items listed above may not be a complete list of foods and beverages you can eat and drink. Contact a dietitian for   more information. Alcohol use Do not drink alcohol if: Your health care provider tells you not to drink. You are pregnant, may be pregnant, or are planning to become pregnant. If you drink alcohol: Limit how much you have to: 0-1 drink a  day for women. 0-2 drinks a day for men. Know how much alcohol is in your drink. In the [place] one drink equals one 12 oz bottle of beer (355 mL), one 5 oz glass of wine (148 mL), or one 1 oz glass of hard liquor (44 mL). General instructions Get vitamin B12 injections if told to by your health care provider. Take supplements only as told by your health care provider. Follow the directions carefully. Keep all follow-up visits. This is important. Contact a health care provider if: Your symptoms come back. Your symptoms get worse or do not improve with treatment. Get help right away: You develop shortness of breath. You have a rapid heart rate. You have chest pain. You become dizzy or you faint. These symptoms may be an emergency. Get help right away. Call 911. Do not wait to see if the symptoms will go away. Do not drive yourself to the hospital. Summary Vitamin B12 deficiency occurs when the body does not have enough of this important vitamin. Common causes include not eating enough foods that contain vitamin B12, not being able to absorb vitamin B12 from the food that you eat, having a surgery in which part of the stomach or small intestine is removed, or taking certain medicines. Eat foods that have vitamin B12 in them. Treatment may include making a change in the way you eat and drink, getting vitamin B12 injections, or taking vitamin B12 supplements. This information is not intended to replace advice given to you by your health care provider. Make sure you discuss any questions you have with your health care provider. Document Revised: 05/10/2021 Document Reviewed: 05/10/2021 Elsevier Patient Education  2023 [employer]  

## 2022-04-06 LAB — CUP PACEART REMOTE DEVICE CHECK
Battery Remaining Longevity: 26 mo
Battery Remaining Percentage: 29 %
Battery Voltage: 2.83 V
Brady Statistic RV Percent Paced: 25 %
Date Time Interrogation Session: 20230707203005
HighPow Impedance: 63 Ohm
HighPow Impedance: 63 Ohm
Implantable Lead Implant Date: 20090708
Implantable Lead Implant Date: 20090708
Implantable Lead Location: 753859
Implantable Lead Location: 753860
Implantable Lead Model: 7122
Implantable Pulse Generator Implant Date: 20170106
Lead Channel Impedance Value: 350 Ohm
Lead Channel Pacing Threshold Amplitude: 1 V
Lead Channel Pacing Threshold Pulse Width: 0.9 ms
Lead Channel Sensing Intrinsic Amplitude: 6.5 mV
Lead Channel Setting Pacing Amplitude: 2.5 V
Lead Channel Setting Pacing Pulse Width: 0.9 ms
Lead Channel Setting Sensing Sensitivity: 0.5 mV
Pulse Gen Serial Number: 7306179

## 2022-04-23 NOTE — Progress Notes (Signed)
Remote ICD transmission.   

## 2022-05-02 ENCOUNTER — Inpatient Hospital Stay: Payer: Medicare HMO | Attending: Oncology

## 2022-05-02 ENCOUNTER — Other Ambulatory Visit: Payer: Self-pay

## 2022-05-02 ENCOUNTER — Inpatient Hospital Stay: Payer: Medicare HMO

## 2022-05-02 ENCOUNTER — Inpatient Hospital Stay: Payer: Medicare HMO | Admitting: Oncology

## 2022-05-02 VITALS — BP 120/63 | HR 60 | Temp 98.1°F | Resp 16 | Wt 212.9 lb

## 2022-05-02 DIAGNOSIS — D508 Other iron deficiency anemias: Secondary | ICD-10-CM | POA: Insufficient documentation

## 2022-05-02 DIAGNOSIS — E538 Deficiency of other specified B group vitamins: Secondary | ICD-10-CM | POA: Diagnosis not present

## 2022-05-02 DIAGNOSIS — D518 Other vitamin B12 deficiency anemias: Secondary | ICD-10-CM | POA: Insufficient documentation

## 2022-05-02 DIAGNOSIS — D509 Iron deficiency anemia, unspecified: Secondary | ICD-10-CM

## 2022-05-02 LAB — CBC WITH DIFFERENTIAL (CANCER CENTER ONLY)
Abs Immature Granulocytes: 0.01 10*3/uL (ref 0.00–0.07)
Basophils Absolute: 0 10*3/uL (ref 0.0–0.1)
Basophils Relative: 1 %
Eosinophils Absolute: 0.2 10*3/uL (ref 0.0–0.5)
Eosinophils Relative: 3 %
HCT: 42.1 % (ref 39.0–52.0)
Hemoglobin: 14.3 g/dL (ref 13.0–17.0)
Immature Granulocytes: 0 %
Lymphocytes Relative: 24 %
Lymphs Abs: 1.6 10*3/uL (ref 0.7–4.0)
MCH: 30.9 pg (ref 26.0–34.0)
MCHC: 34 g/dL (ref 30.0–36.0)
MCV: 90.9 fL (ref 80.0–100.0)
Monocytes Absolute: 0.8 10*3/uL (ref 0.1–1.0)
Monocytes Relative: 12 %
Neutro Abs: 3.9 10*3/uL (ref 1.7–7.7)
Neutrophils Relative %: 60 %
Platelet Count: 190 10*3/uL (ref 150–400)
RBC: 4.63 MIL/uL (ref 4.22–5.81)
RDW: 13.6 % (ref 11.5–15.5)
WBC Count: 6.5 10*3/uL (ref 4.0–10.5)
nRBC: 0 % (ref 0.0–0.2)

## 2022-05-02 LAB — IRON AND IRON BINDING CAPACITY (CC-WL,HP ONLY)
Iron: 46 ug/dL (ref 45–182)
Saturation Ratios: 12 % — ABNORMAL LOW (ref 17.9–39.5)
TIBC: 391 ug/dL (ref 250–450)
UIBC: 345 ug/dL (ref 117–376)

## 2022-05-02 LAB — VITAMIN B12: Vitamin B-12: 415 pg/mL (ref 180–914)

## 2022-05-02 LAB — FERRITIN: Ferritin: 31 ng/mL (ref 24–336)

## 2022-05-02 MED ORDER — CYANOCOBALAMIN 1000 MCG/ML IJ SOLN
1000.0000 ug | Freq: Once | INTRAMUSCULAR | Status: AC
  Administered 2022-05-02: 1000 ug via INTRAMUSCULAR
  Filled 2022-05-02: qty 1

## 2022-05-02 NOTE — Progress Notes (Signed)
[facility] HEMATOLOGY OFFICE PROGRESS NOTE 05/02/22    DIAGNOSIS: 76 year old man with multifactorial anemia related to B12 deficiency and iron deficiency related to poor absorption diagnosed in 2008.       CURRENT THERAPY:    B-12 1000 mcg IM injection monthly started in 2008.  IV iron infusion when needed.  Last treatment given in November 2023.  INTERVAL HISTORY:  [name] returns today for a follow-up.  Since last visit, he reports feeling well without any major complaints.  He denies any excessive fatigue, tiredness or weakness.  He denies any hematochezia, melena or mopped.  His performance status quality of life remains unchanged.      MEDICATIONS: Reviewed without changes  Current Outpatient Medications  Medication Sig Dispense Refill   acetaminophen (TYLENOL) 500 MG tablet Take 1 tablet (500 mg total) by mouth every 6 (six) hours as needed. 30 tablet 0   atorvastatin (LIPITOR) 40 MG tablet      baclofen (LIORESAL) 20 MG tablet Take 40 mg by mouth at bedtime.      carvedilol (COREG) 25 MG tablet Take 1 tablet (25 mg total) by mouth 2 (two) times daily with a meal. 180 tablet 3   cyclobenzaprine (FLEXERIL) 10 MG tablet Take 1 tablet (10 mg total) by mouth 2 (two) times daily as needed for muscle spasms. 20 tablet 0   doxycycline (VIBRA-TABS) 100 MG tablet Take 1 tablet (100 mg total) by mouth 2 (two) times daily. 14 tablet 0   DULoxetine (CYMBALTA) 30 MG capsule duloxetine 30 mg capsule,delayed release  Take 1 capsule every day by oral route.     furosemide (LASIX) 40 MG tablet Take 40 mg by mouth daily as needed for fluid.      glimepiride (AMARYL) 4 MG tablet Take 4 mg by mouth daily with breakfast.     linagliptin (TRADJENTA) 5 MG TABS tablet Take 5 mg by mouth daily after breakfast.      meclizine (ANTIVERT) 25 MG tablet Take 25 mg by mouth 4 (four) times daily as needed. For dizziness     metFORMIN (GLUCOPHAGE) 500 MG tablet       oxyCODONE-acetaminophen (PERCOCET/ROXICET) 5-325 MG tablet Take a half tablet every 6 hours     rivaroxaban (XARELTO) 20 MG TABS tablet TAKE 1 TABLET BY MOUTH ONCE DAILY WITH SUPPER 30 tablet 5   SOLIQUA 100-33 UNT-MCG/ML SOPN      spironolactone (ALDACTONE) 25 MG tablet Take 1 tablet (25 mg total) by mouth every morning. 90 tablet 3   tamsulosin (FLOMAX) 0.4 MG CAPS capsule tamsulosin 0.4 mg capsule  TAKE 1 CAPSULE BY MOUTH ONCE DAILY     triamcinolone cream (KENALOG) 0.5 % triamcinolone acetonide 0.5 % topical cream  APPLY CREAM TOPICALLY TO AFFECTED AREA TWICE DAILY     No current facility-administered medications for this visit.     PHYSICAL EXAMINATION:   Blood pressure 120/63, pulse 60, temperature 98.1 F (36.7 C), temperature source Temporal, resp. rate 16, weight 212 lb 14.4 oz (96.6 kg), SpO2 99 %.     ECOG PERFORMANCE STATUS: 1    General appearance: Comfortable appearing without any discomfort Head: Normocephalic without any trauma Oropharynx: Mucous membranes are moist and pink without any thrush or ulcers. Eyes: Pupils are equal and round reactive to light. Lymph nodes: No cervical, supraclavicular, inguinal or axillary lymphadenopathy.   Heart:regular rate and rhythm.  S1 and S2 without leg edema. Lung: Clear without any rhonchi or wheezes.  No dullness to  percussion. Abdomin: Soft, nontender, nondistended with good bowel sounds.  No hepatosplenomegaly. Musculoskeletal: No joint deformity or effusion.  Full range of motion noted. Neurological: No deficits noted on motor, sensory and deep tendon reflex exam. Skin: No petechial rash or dryness.  Appeared moist.             Labs:  Lab Results  Component Value Date   WBC 6.5 05/02/2022   HGB 14.3 05/02/2022   HCT 42.1 05/02/2022   MCV 90.9 05/02/2022   PLT 190 05/02/2022   NEUTROABS 3.9 05/02/2022      Chemistry      Component Value Date/Time   NA 141 11/08/2020 1415   NA 141 06/02/2017 0952    K 3.7 11/08/2020 1415   K 4.0 06/02/2017 0952   CL 100 11/08/2020 1415   CL 100 01/06/2013 0830   CO2 33 (H) 11/08/2020 1415   CO2 28 06/02/2017 0952   BUN 23 11/08/2020 1415   BUN 15.7 06/02/2017 0952   CREATININE 1.15 11/08/2020 1415   CREATININE 0.9 06/02/2017 0952      Component Value Date/Time   CALCIUM 9.1 11/08/2020 1415   CALCIUM 9.4 06/02/2017 0952   ALKPHOS 98 04/27/2019 0153   ALKPHOS 81 06/02/2017 0952   AST 15 04/27/2019 0153   AST 13 06/02/2017 0952   ALT 11 04/27/2019 0153   ALT 9 06/02/2017 0952   BILITOT 0.5 04/27/2019 0153   BILITOT 0.46 06/02/2017 0952        Assessment and plan:    76 year old man with:   1.  B12 deficiency diagnosed in 2008.  He remains on related to poor absorption noted in 2008.   He continues to receive B12 injections without any complications.  B12 level continues to be adequate without any need for any additional supplementation.  His hemoglobin continues to improve as well.    2. Iron deficiency anemia related to poor iron absorption diagnosed in 2008.  Iron studies on January 03, 2022 were all within normal range at this time.  We will continue to monitor and supplement as needed.      3. Follow-up: We will continue to follow every 6 months for MD visit and B12 injection every 4 weeks.   30  minutes were spent on this encounter.  Time was dedicated to reviewing laboratory data, disease status update and outlining future plan of care discussion.  [name] Hose MD 05/02/22

## 2022-06-01 ENCOUNTER — Emergency Department (HOSPITAL_COMMUNITY): Payer: Medicare HMO

## 2022-06-01 ENCOUNTER — Observation Stay (HOSPITAL_COMMUNITY)
Admission: EM | Admit: 2022-06-01 | Discharge: 2022-06-03 | Disposition: A | Discharge status: Home Health | Payer: Medicare HMO | Attending: Family Medicine | Admitting: Family Medicine

## 2022-06-01 ENCOUNTER — Encounter (HOSPITAL_COMMUNITY): Payer: Self-pay | Admitting: Emergency Medicine

## 2022-06-01 DIAGNOSIS — R531 Weakness: Secondary | ICD-10-CM | POA: Diagnosis not present

## 2022-06-01 DIAGNOSIS — G8929 Other chronic pain: Secondary | ICD-10-CM

## 2022-06-01 DIAGNOSIS — Z7984 Long term (current) use of oral hypoglycemic drugs: Secondary | ICD-10-CM | POA: Diagnosis not present

## 2022-06-01 DIAGNOSIS — I509 Heart failure, unspecified: Secondary | ICD-10-CM

## 2022-06-01 DIAGNOSIS — Z87891 Personal history of nicotine dependence: Secondary | ICD-10-CM | POA: Insufficient documentation

## 2022-06-01 DIAGNOSIS — Z7901 Long term (current) use of anticoagulants: Secondary | ICD-10-CM | POA: Diagnosis not present

## 2022-06-01 DIAGNOSIS — N179 Acute kidney failure, unspecified: Secondary | ICD-10-CM | POA: Diagnosis not present

## 2022-06-01 DIAGNOSIS — R42 Dizziness and giddiness: Secondary | ICD-10-CM | POA: Diagnosis present

## 2022-06-01 DIAGNOSIS — Z95810 Presence of automatic (implantable) cardiac defibrillator: Secondary | ICD-10-CM | POA: Insufficient documentation

## 2022-06-01 DIAGNOSIS — Z79899 Other long term (current) drug therapy: Secondary | ICD-10-CM | POA: Diagnosis not present

## 2022-06-01 DIAGNOSIS — D509 Iron deficiency anemia, unspecified: Secondary | ICD-10-CM | POA: Diagnosis present

## 2022-06-01 DIAGNOSIS — I2510 Atherosclerotic heart disease of native coronary artery without angina pectoris: Secondary | ICD-10-CM | POA: Diagnosis not present

## 2022-06-01 DIAGNOSIS — I480 Paroxysmal atrial fibrillation: Secondary | ICD-10-CM

## 2022-06-01 DIAGNOSIS — Z96653 Presence of artificial knee joint, bilateral: Secondary | ICD-10-CM | POA: Diagnosis not present

## 2022-06-01 DIAGNOSIS — Z8673 Personal history of transient ischemic attack (TIA), and cerebral infarction without residual deficits: Secondary | ICD-10-CM

## 2022-06-01 DIAGNOSIS — E876 Hypokalemia: Secondary | ICD-10-CM | POA: Diagnosis not present

## 2022-06-01 DIAGNOSIS — E1351 Other specified diabetes mellitus with diabetic peripheral angiopathy without gangrene: Secondary | ICD-10-CM | POA: Diagnosis present

## 2022-06-01 DIAGNOSIS — G4730 Sleep apnea, unspecified: Secondary | ICD-10-CM | POA: Diagnosis not present

## 2022-06-01 DIAGNOSIS — Z951 Presence of aortocoronary bypass graft: Secondary | ICD-10-CM | POA: Diagnosis not present

## 2022-06-01 DIAGNOSIS — G894 Chronic pain syndrome: Secondary | ICD-10-CM | POA: Diagnosis not present

## 2022-06-01 DIAGNOSIS — D649 Anemia, unspecified: Secondary | ICD-10-CM

## 2022-06-01 DIAGNOSIS — I110 Hypertensive heart disease with heart failure: Secondary | ICD-10-CM | POA: Diagnosis not present

## 2022-06-01 DIAGNOSIS — E119 Type 2 diabetes mellitus without complications: Secondary | ICD-10-CM | POA: Diagnosis not present

## 2022-06-01 DIAGNOSIS — I10 Essential (primary) hypertension: Secondary | ICD-10-CM | POA: Diagnosis present

## 2022-06-01 DIAGNOSIS — I5022 Chronic systolic (congestive) heart failure: Secondary | ICD-10-CM | POA: Insufficient documentation

## 2022-06-01 LAB — URINALYSIS, ROUTINE W REFLEX MICROSCOPIC
Bilirubin Urine: NEGATIVE
Glucose, UA: NEGATIVE mg/dL
Hgb urine dipstick: NEGATIVE
Ketones, ur: NEGATIVE mg/dL
Leukocytes,Ua: NEGATIVE
Nitrite: NEGATIVE
Protein, ur: NEGATIVE mg/dL
Specific Gravity, Urine: 1.01 (ref 1.005–1.030)
pH: 6 (ref 5.0–8.0)

## 2022-06-01 LAB — CBC
HCT: 31.5 % — ABNORMAL LOW (ref 39.0–52.0)
Hemoglobin: 10.4 g/dL — ABNORMAL LOW (ref 13.0–17.0)
MCH: 30.6 pg (ref 26.0–34.0)
MCHC: 33 g/dL (ref 30.0–36.0)
MCV: 92.6 fL (ref 80.0–100.0)
Platelets: 160 10*3/uL (ref 150–400)
RBC: 3.4 MIL/uL — ABNORMAL LOW (ref 4.22–5.81)
RDW: 14 % (ref 11.5–15.5)
WBC: 6.7 10*3/uL (ref 4.0–10.5)
nRBC: 0 % (ref 0.0–0.2)

## 2022-06-01 LAB — BASIC METABOLIC PANEL
Anion gap: 9 (ref 5–15)
BUN: 46 mg/dL — ABNORMAL HIGH (ref 8–23)
CO2: 31 mmol/L (ref 22–32)
Calcium: 8.2 mg/dL — ABNORMAL LOW (ref 8.9–10.3)
Chloride: 98 mmol/L (ref 98–111)
Creatinine, Ser: 1.78 mg/dL — ABNORMAL HIGH (ref 0.61–1.24)
GFR, Estimated: 40 mL/min — ABNORMAL LOW (ref >60)
Glucose, Bld: 90 mg/dL (ref 70–99)
Potassium: 2.7 mmol/L — CL (ref 3.5–5.1)
Sodium: 138 mmol/L (ref 135–145)

## 2022-06-01 LAB — HEPATIC FUNCTION PANEL
ALT: 13 U/L (ref 0–44)
AST: 15 U/L (ref 15–41)
Albumin: 3.4 g/dL — ABNORMAL LOW (ref 3.5–5.0)
Alkaline Phosphatase: 81 U/L (ref 38–126)
Bilirubin, Direct: 0.1 mg/dL (ref 0.0–0.2)
Indirect Bilirubin: 0.8 mg/dL (ref 0.3–0.9)
Total Bilirubin: 0.9 mg/dL (ref 0.3–1.2)
Total Protein: 5.9 g/dL — ABNORMAL LOW (ref 6.5–8.1)

## 2022-06-01 LAB — CBG MONITORING, ED: Glucose-Capillary: 66 mg/dL — ABNORMAL LOW (ref 70–99)

## 2022-06-01 LAB — MAGNESIUM: Magnesium: 2 mg/dL (ref 1.7–2.4)

## 2022-06-01 LAB — TROPONIN I (HIGH SENSITIVITY)
Troponin I (High Sensitivity): 10 ng/L (ref <18)
Troponin I (High Sensitivity): 10 ng/L (ref <18)

## 2022-06-01 MED ORDER — IOHEXOL 350 MG/ML SOLN
60.0000 mL | Freq: Once | INTRAVENOUS | Status: AC | PRN
Start: 2022-06-01 — End: 2022-06-01
  Administered 2022-06-01: 60 mL via INTRAVENOUS

## 2022-06-01 MED ORDER — POTASSIUM CHLORIDE 10 MEQ/100ML IV SOLN
10.0000 meq | INTRAVENOUS | Status: AC
  Administered 2022-06-01 – 2022-06-02 (×4): 10 meq via INTRAVENOUS
  Filled 2022-06-01 (×4): qty 100

## 2022-06-01 NOTE — ED Triage Notes (Signed)
Pt BIB [facility] from home, after pt told family that he has been dizzy all day. Pt also had a mechanical fall after tripping at home but denies any injuries.

## 2022-06-01 NOTE — ED Notes (Signed)
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 Pt does not appear to have significant mobility deficits, however has sensory deficits onnentire right side.

## 2022-06-01 NOTE — ED Provider Notes (Signed)
[facility] EMERGENCY DEPARTMENT Provider Note   CSN: [id number] Arrival date & time: 06/01/22  1930     History  Chief Complaint  Patient presents with   Dizziness    [name] is a 76 y.o. male.   Dizziness Patient presents with dizziness.  Stroke 20 years ago now feeling more lightheaded.  States somewhat weak on the right side from previous stroke.  States worse today but with further questioning states he has been feeling bad for about a week.  Has been lightheaded.  Worse with certain movements.  Worse when he stands up.  No neck pain.  States his right side feels weaker than baseline.  No headache.  No confusion.  Has a pacemaker and reportedly has one that is incompatible with MRI.    Past Medical History:  Diagnosis Date   Anemia    Multifactorial  - hx of B12 def and iron deficiency, followed by heme, followed at [facility]   Automatic implantable cardioverter-defibrillator in situ    CAD (coronary artery disease)    a. STEMI s/p CABG 07/2007 (had preop cardiogenic shock, IABP, VDRF before surgery)   Cardiomyopathy, ischemic    a. Chronic systolic CHF s/p [employer] Jude dual chamber ICD 03/2008.   Carotid stenosis    a. Prior carotid dz/ surgery. b. Carotid dopp 07/2011 - 0-39% bilaterally.;   c. Doppler 11/13: 40-59% RICA, 0-39% LICA   Chronic renal insufficiency    Diabetes mellitus    GI bleed    Reported history   Hx of CABG    Hypertension    [place] [facility]    Mitral regurgitation    Myocardial infarction [facility])    Neuromuscular disorder (HCC)    R sided weakness    Osteoarthritis    PAF (paroxysmal atrial fibrillation) (HCC)    a. Poor Coumadin candidate due to hx of GI bleed.   Sinus bradycardia    Sleep apnea    study done, 10 yrs ago, unable to tolerate CPAP   Splenomegaly    Stroke [facility])    a. CVA 1990s - chronic pain in RUE after stroke.   Systolic CHF, chronic (HCC)    Previously taken off lisinopril by primary doctor due to labs    Tobacco abuse     Home Medications Prior to Admission medications   Medication Sig Start Date End Date Taking? Authorizing Provider  acetaminophen (TYLENOL) 500 MG tablet Take 1 tablet (500 mg total) by mouth every 6 (six) hours as needed. 12/22/21   Horton, Mayer Masker, MD  atorvastatin (LIPITOR) 40 MG tablet  07/16/20   [provider]  baclofen (LIORESAL) 20 MG tablet Take 40 mg by mouth at bedtime.     [provider]  carvedilol (COREG) 25 MG tablet Take 1 tablet (25 mg total) by mouth 2 (two) times daily with a meal. 09/12/20   Marinus Maw, MD  cyclobenzaprine (FLEXERIL) 10 MG tablet Take 1 tablet (10 mg total) by mouth 2 (two) times daily as needed for muscle spasms. 12/22/21   Horton, Mayer Masker, MD  doxycycline (VIBRA-TABS) 100 MG tablet Take 1 tablet (100 mg total) by mouth 2 (two) times daily. 04/02/21   Park Liter, DPM  DULoxetine (CYMBALTA) 30 MG capsule duloxetine 30 mg capsule,delayed release  Take 1 capsule every day by oral route.    [provider]  furosemide (LASIX) 40 MG tablet Take 40 mg by mouth daily as needed for fluid.  [provider]  glimepiride (AMARYL) 4 MG tablet Take 4 mg by mouth daily with breakfast.    [provider]  linagliptin (TRADJENTA) 5 MG TABS tablet Take 5 mg by mouth daily after breakfast.     [provider]  meclizine (ANTIVERT) 25 MG tablet Take 25 mg by mouth 4 (four) times daily as needed. For dizziness 02/26/12   [provider]  metFORMIN (GLUCOPHAGE) 500 MG tablet  08/26/20   [provider]  oxyCODONE-acetaminophen (PERCOCET/ROXICET) 5-325 MG tablet Take a half tablet every 6 hours 04/18/19   [provider]  rivaroxaban (XARELTO) 20 MG TABS tablet TAKE 1 TABLET BY MOUTH ONCE DAILY WITH SUPPER 12/30/21   Marinus Maw, MD  SOLIQUA 100-33 UNT-MCG/ML SOPN  07/17/20   [provider]  spironolactone (ALDACTONE) 25 MG tablet Take 1 tablet (25 mg  total) by mouth every morning. 07/20/14   Marinus Maw, MD  tamsulosin (FLOMAX) 0.4 MG CAPS capsule tamsulosin 0.4 mg capsule  TAKE 1 CAPSULE BY MOUTH ONCE DAILY    [provider]  triamcinolone cream (KENALOG) 0.5 % triamcinolone acetonide 0.5 % topical cream  APPLY CREAM TOPICALLY TO AFFECTED AREA TWICE DAILY    [provider]      Allergies    Codeine    Review of Systems   Review of Systems  Neurological:  Positive for dizziness.    Physical Exam Updated Vital Signs BP (!) 123/59   Pulse 60   Temp 97.7 F (36.5 C)   Resp 19   Ht 6\' 2"  (1.88 m)   Wt 96.6 kg   SpO2 97%   BMI 27.34 kg/m  Physical Exam Vitals and nursing note reviewed.  HENT:     Head: Atraumatic.  Eyes:     Pupils: Pupils are equal, round, and reactive to light.  Cardiovascular:     Rate and Rhythm: Regular rhythm.  Pulmonary:     Breath sounds: Normal breath sounds.  Musculoskeletal:     Cervical back: Neck supple.     Comments: Some edema and chronic venous changes of bilateral lower extremities.  Skin:    Capillary Refill: Capillary refill takes less than 2 seconds.  Neurological:     Mental Status: He is alert and oriented to person, place, and time.     Comments: Face symmetric.  Eye movements intact.  Finger-nose intact on the left but may be a little off on the right.  States that he has baseline a little weak on that side.  Heel shin also appears to be grossly intact.     ED Results / Procedures / Treatments   Labs (all labs ordered are listed, but only abnormal results are displayed) Labs Reviewed  BASIC METABOLIC PANEL - Abnormal; Notable for the following components:      Result Value   Potassium 2.7 (*)    BUN 46 (*)    Creatinine, Ser 1.78 (*)    Calcium 8.2 (*)    GFR, Estimated 40 (*)    All other components within normal limits  CBC - Abnormal; Notable for the following components:   RBC 3.40 (*)    Hemoglobin 10.4 (*)    HCT 31.5 (*)    All other  components within normal limits  URINALYSIS, ROUTINE W REFLEX MICROSCOPIC - Abnormal; Notable for the following components:   Color, Urine STRAW (*)    All other components within normal limits  HEPATIC FUNCTION PANEL - Abnormal; Notable for  the following components:   Total Protein 5.9 (*)    Albumin 3.4 (*)    All other components within normal limits  MAGNESIUM  TROPONIN I (HIGH SENSITIVITY)  TROPONIN I (HIGH SENSITIVITY)    EKG None  Radiology CT ANGIO HEAD NECK W WO CM  Result Date: 06/01/2022 CLINICAL DATA:  Initial evaluation for neuro deficit, stroke suspected, dizziness. EXAM: CT ANGIOGRAPHY HEAD AND NECK TECHNIQUE: Multidetector CT imaging of the head and neck was performed using the standard protocol during bolus administration of intravenous contrast. Multiplanar CT image reconstructions and MIPs were obtained to evaluate the vascular anatomy. Carotid stenosis measurements (when applicable) are obtained utilizing NASCET criteria, using the distal internal carotid diameter as the denominator. RADIATION DOSE REDUCTION: This exam was performed according to the departmental dose-optimization program which includes automated exposure control, adjustment of the mA and/or kV according to patient size and/or use of iterative reconstruction technique. CONTRAST:  53mL OMNIPAQUE IOHEXOL 350 MG/ML SOLN COMPARISON:  Prior study from 04/27/2019. FINDINGS: CT HEAD FINDINGS Brain: Cerebral volume within normal limits for age. Mild-to-moderate chronic microvascular ischemic disease. Chronic left cerebral infarct with associated ex vacuo dilatation of the left lateral ventricle, stable. No acute intracranial hemorrhage. No acute large vessel territory infarct. No mass lesion or midline shift. No extra-axial fluid collection. Vascular: No hyperdense vessel. Scattered vascular calcifications noted within the carotid siphons. Skull: Scalp soft tissues demonstrate no acute finding. Calvarium intact.  Sinuses/Orbits: Globes and orbital soft tissues within normal limits. Changes of chronic paranasal sinus disease, most pronounced at the right sphenoid sinus. Mastoid air cells are largely clear. Other: None. Review of the MIP images confirms the above findings CTA NECK FINDINGS Aortic arch: Visualized aortic arch normal caliber with normal 3 vessel morphology. Mild-to-moderate atheromatous change about the arch and origin of the great vessels without hemodynamically significant stenosis. Right carotid system: Right CCA widely patent. Atheromatous change about the right carotid bulb/proximal right ICA with associated stenosis of up to 40% by NASCET criteria. Right ICA patent distally without stenosis or dissection. Left carotid system: Left CCA patent without stenosis. Mild eccentric plaque about the left carotid bulb/proximal left ICA without hemodynamically significant stenosis. Left ICA patent distally without stenosis or dissection. Vertebral arteries: Both vertebral arteries arise from the subclavian arteries. Approximate 70% stenosis noted at the origin of the right subclavian artery (series 9, image 598). Right vertebral artery dominant. Evaluation of the proximal right vertebral artery somewhat limited by streak artifact from adjacent surgical clips. The visualized portions of the right vertebral artery are otherwise patent without stenosis or dissection. The left vertebral artery is occluded at its origin. Distal reconstitution at the distal left V2 segment via cervical collaterals. Left vertebral artery hypoplastic but otherwise patent distally to the skull base. Skeleton: No discrete or worrisome osseous lesions. Moderate spondylosis present at C5-6. Patient is edentulous. Other neck: No other acute soft tissue abnormality within the neck. Upper chest: Visualized upper chest demonstrates no acute finding. Right-sided pacemaker/AICD noted. Review of the MIP images confirms the above findings CTA HEAD  FINDINGS Anterior circulation: Petrous segments patent bilaterally. Scattered atheromatous change within the carotid siphons without hemodynamically significant stenosis. A1 segments patent bilaterally. Normal anterior communicating artery complex. Anterior cerebral arteries patent without stenosis. No M1 stenosis or occlusion. No proximal MCA branch occlusion. Distal MCA branches perfused and symmetric right Posterior circulation: Dominant right V4 segment widely patent. Right PICA patent. Focal moderate stenosis seen about the hypoplastic left vertebral artery at the takeoff of the  left PICA. Left PICA remains patent. Short-segment fenestration noted within the left V4 segment distally. Basilar patent to its distal aspect without stenosis. Superior cerebellar and posterior cerebral arteries patent bilaterally. Venous sinuses: Patent allowing for timing the contrast bolus. Anatomic variants: As above.  No aneurysm. Review of the MIP images confirms the above findings IMPRESSION: CT HEAD: 1. No acute intracranial abnormality. 2. Chronic left cerebral ischemic changes with associated ex vacuo dilatation of the left lateral ventricle. 3. Moderate chronic microvascular ischemic disease. 4. Chronic paranasal sinus disease, most pronounced at the right sphenoid sinus. CTA HEAD AND NECK: 1. Negative CTA for acute large vessel occlusion or other emergent finding. 2. 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 Distal reconstitution at the distal left V2 segment the a cervical collaterals. Additional moderate stenosis at the distal left V4 segment. 70% stenosis at the origin of the right subclavian artery. Dominant right vertebral artery widely patent. 3. 40% atheromatous stenosis at the origin of the right ICA. 4. Otherwise wide patency of the major arterial vasculature of the head and neck. No other hemodynamically significant or correctable stenosis. Overall, appearance is relatively similar as compared  to 04/27/2019. Electronically Signed   By: Rise Mu M.D.   On: 06/01/2022 23:11   DG Chest Portable 1 View  Result Date: 06/01/2022 CLINICAL DATA:  Weakness.  Dizziness. EXAM: PORTABLE CHEST 1 VIEW COMPARISON:  09/14/2017 FINDINGS: Right-sided pacemaker with lead tips projecting over the right atrium and ventricle. Prior median sternotomy, stable mild cardiomegaly. Unchanged mediastinal contours. No pulmonary edema, pleural effusion, pneumothorax or focal airspace disease. Surgical clips project over the right thoracic inlet. On limited assessment, no acute osseous abnormalities are seen. IMPRESSION: Stable mild cardiomegaly. No acute chest findings. Electronically Signed   By: Narda Rutherford M.D.   On: 06/01/2022 21:02    Procedures Procedures    Medications Ordered in ED Medications  potassium chloride 10 mEq in 100 mL IVPB (has no administration in time range)  iohexol (OMNIPAQUE) 350 MG/ML injection 60 mL (60 mLs Intravenous Contrast Given 06/01/22 2145)    ED Course/ Medical Decision Making/ A&P                           Medical Decision Making Amount and/or Complexity of Data Reviewed Labs: ordered. Radiology: ordered.  Risk Prescription drug management.   Patient with generalized weakness but also dizziness.  States he feels weak all over but also weak on the right side.  Previous stroke on the right side.  Also has had some previous vertebral issues.  History of A-fib.  Patient cannot get MRI due to pacemaker.  Will get a CT a of head and neck.  We will get also some basic blood work.  Differential diagnosis includes stroke, vertigo, arrhythmia, anemia.  Head CT/CTA done and shows chronic changes.  Nothing acute but does have some chronic blockages.  Has hypokalemia that could potentially do some of the weakness.  Also has worsening chronic anemia.  I think potassium potentially could be some of the weakness and with potassium 2.70 benefit from more acute treatment.   We will give IV treatment and will discuss with hospitalist for admission.  Potentially does have some focal deficits in the right feels more weak than the left, but does have some chronic deficits there.  Acute stroke felt less likely but unable to get MRI at this time due to AICD. Patient already sees hematology for  his anemia.        Final Clinical Impression(s) / ED Diagnoses Final diagnoses:  Hypokalemia  Anemia, unspecified type  Weakness    Rx / DC Orders ED Discharge Orders     None         Benjiman Core, MD 06/01/22 2330

## 2022-06-02 ENCOUNTER — Other Ambulatory Visit: Payer: Self-pay

## 2022-06-02 DIAGNOSIS — R42 Dizziness and giddiness: Secondary | ICD-10-CM

## 2022-06-02 DIAGNOSIS — N179 Acute kidney failure, unspecified: Secondary | ICD-10-CM | POA: Diagnosis not present

## 2022-06-02 DIAGNOSIS — G8929 Other chronic pain: Secondary | ICD-10-CM

## 2022-06-02 DIAGNOSIS — E1351 Other specified diabetes mellitus with diabetic peripheral angiopathy without gangrene: Secondary | ICD-10-CM

## 2022-06-02 DIAGNOSIS — I5042 Chronic combined systolic (congestive) and diastolic (congestive) heart failure: Secondary | ICD-10-CM | POA: Diagnosis not present

## 2022-06-02 DIAGNOSIS — E876 Hypokalemia: Secondary | ICD-10-CM | POA: Diagnosis not present

## 2022-06-02 DIAGNOSIS — D509 Iron deficiency anemia, unspecified: Secondary | ICD-10-CM

## 2022-06-02 DIAGNOSIS — Z8673 Personal history of transient ischemic attack (TIA), and cerebral infarction without residual deficits: Secondary | ICD-10-CM

## 2022-06-02 DIAGNOSIS — I480 Paroxysmal atrial fibrillation: Secondary | ICD-10-CM

## 2022-06-02 DIAGNOSIS — I10 Essential (primary) hypertension: Secondary | ICD-10-CM

## 2022-06-02 DIAGNOSIS — D649 Anemia, unspecified: Secondary | ICD-10-CM

## 2022-06-02 LAB — COMPREHENSIVE METABOLIC PANEL
ALT: 10 U/L (ref 0–44)
AST: 15 U/L (ref 15–41)
Albumin: 3.1 g/dL — ABNORMAL LOW (ref 3.5–5.0)
Alkaline Phosphatase: 78 U/L (ref 38–126)
Anion gap: 8 (ref 5–15)
BUN: 43 mg/dL — ABNORMAL HIGH (ref 8–23)
CO2: 30 mmol/L (ref 22–32)
Calcium: 8.3 mg/dL — ABNORMAL LOW (ref 8.9–10.3)
Chloride: 101 mmol/L (ref 98–111)
Creatinine, Ser: 1.51 mg/dL — ABNORMAL HIGH (ref 0.61–1.24)
GFR, Estimated: 48 mL/min — ABNORMAL LOW (ref >60)
Glucose, Bld: 117 mg/dL — ABNORMAL HIGH (ref 70–99)
Potassium: 3 mmol/L — ABNORMAL LOW (ref 3.5–5.1)
Sodium: 139 mmol/L (ref 135–145)
Total Bilirubin: 0.8 mg/dL (ref 0.3–1.2)
Total Protein: 5.6 g/dL — ABNORMAL LOW (ref 6.5–8.1)

## 2022-06-02 LAB — MAGNESIUM: Magnesium: 2.1 mg/dL (ref 1.7–2.4)

## 2022-06-02 LAB — GLUCOSE, CAPILLARY
Glucose-Capillary: 123 mg/dL — ABNORMAL HIGH (ref 70–99)
Glucose-Capillary: 80 mg/dL (ref 70–99)
Glucose-Capillary: 129 mg/dL — ABNORMAL HIGH (ref 70–99)
Glucose-Capillary: 192 mg/dL — ABNORMAL HIGH (ref 70–99)
Glucose-Capillary: 147 mg/dL — ABNORMAL HIGH (ref 70–99)

## 2022-06-02 LAB — HEMOGLOBIN A1C
Hgb A1c MFr Bld: 7.5 % — ABNORMAL HIGH (ref 4.8–5.6)
Mean Plasma Glucose: 168.55 mg/dL

## 2022-06-02 LAB — CBC WITH DIFFERENTIAL/PLATELET
Abs Immature Granulocytes: 0.02 10*3/uL (ref 0.00–0.07)
Basophils Absolute: 0 10*3/uL (ref 0.0–0.1)
Basophils Relative: 1 %
Eosinophils Absolute: 0.1 10*3/uL (ref 0.0–0.5)
Eosinophils Relative: 2 %
HCT: 29.7 % — ABNORMAL LOW (ref 39.0–52.0)
Hemoglobin: 9.8 g/dL — ABNORMAL LOW (ref 13.0–17.0)
Immature Granulocytes: 0 %
Lymphocytes Relative: 27 %
Lymphs Abs: 1.5 10*3/uL (ref 0.7–4.0)
MCH: 30.5 pg (ref 26.0–34.0)
MCHC: 33 g/dL (ref 30.0–36.0)
MCV: 92.5 fL (ref 80.0–100.0)
Monocytes Absolute: 0.7 10*3/uL (ref 0.1–1.0)
Monocytes Relative: 13 %
Neutro Abs: 3.1 10*3/uL (ref 1.7–7.7)
Neutrophils Relative %: 57 %
Platelets: 133 10*3/uL — ABNORMAL LOW (ref 150–400)
RBC: 3.21 MIL/uL — ABNORMAL LOW (ref 4.22–5.81)
RDW: 14 % (ref 11.5–15.5)
WBC: 5.4 10*3/uL (ref 4.0–10.5)
nRBC: 0 % (ref 0.0–0.2)

## 2022-06-02 LAB — TSH: TSH: 3.29 u[IU]/mL (ref 0.350–4.500)

## 2022-06-02 LAB — HEMOGLOBIN AND HEMATOCRIT, BLOOD
HCT: 30.8 % — ABNORMAL LOW (ref 39.0–52.0)
Hemoglobin: 10.2 g/dL — ABNORMAL LOW (ref 13.0–17.0)

## 2022-06-02 MED ORDER — DULOXETINE HCL 30 MG PO CPEP
30.0000 mg | ORAL_CAPSULE | Freq: Every day | ORAL | Status: DC
  Administered 2022-06-02 – 2022-06-03 (×2): 30 mg via ORAL
  Filled 2022-06-02 (×2): qty 1

## 2022-06-02 MED ORDER — POLYETHYLENE GLYCOL 3350 17 G PO PACK: 17.0000 g | PACK | Freq: Every day | ORAL | Status: DC | PRN

## 2022-06-02 MED ORDER — OXYCODONE-ACETAMINOPHEN 5-325 MG PO TABS
1.0000 | ORAL_TABLET | Freq: Four times a day (QID) | ORAL | Status: DC | PRN
  Administered 2022-06-02 (×2): 1 via ORAL
  Filled 2022-06-02 (×2): qty 1

## 2022-06-02 MED ORDER — ACETAMINOPHEN 650 MG RE SUPP: 650.0000 mg | Freq: Four times a day (QID) | RECTAL | Status: DC | PRN

## 2022-06-02 MED ORDER — POTASSIUM CHLORIDE CRYS ER 20 MEQ PO TBCR
40.0000 meq | EXTENDED_RELEASE_TABLET | Freq: Once | ORAL | Status: AC
  Administered 2022-06-02: 40 meq via ORAL
  Filled 2022-06-02: qty 2

## 2022-06-02 MED ORDER — HEPARIN SODIUM (PORCINE) 5000 UNIT/ML IJ SOLN
5000.0000 [IU] | Freq: Three times a day (TID) | INTRAMUSCULAR | Status: DC
Start: 2022-06-02 — End: 2022-06-02

## 2022-06-02 MED ORDER — ALUM & MAG HYDROXIDE-SIMETH 200-200-20 MG/5ML PO SUSP
30.0000 mL | Freq: Once | ORAL | Status: AC
  Administered 2022-06-02: 30 mL via ORAL
  Filled 2022-06-02: qty 30

## 2022-06-02 MED ORDER — MECLIZINE HCL 12.5 MG PO TABS: 25.0000 mg | ORAL_TABLET | Freq: Four times a day (QID) | ORAL | Status: DC | PRN

## 2022-06-02 MED ORDER — OXYCODONE HCL 5 MG PO TABS
5.0000 mg | ORAL_TABLET | ORAL | Status: DC | PRN
  Administered 2022-06-02 – 2022-06-03 (×2): 5 mg via ORAL
  Filled 2022-06-02 (×2): qty 1

## 2022-06-02 MED ORDER — RIVAROXABAN 20 MG PO TABS
20.0000 mg | ORAL_TABLET | Freq: Every day | ORAL | Status: DC
  Administered 2022-06-02: 20 mg via ORAL
  Filled 2022-06-02: qty 1

## 2022-06-02 MED ORDER — POTASSIUM CHLORIDE 20 MEQ PO PACK
40.0000 meq | PACK | Freq: Once | ORAL | Status: AC
  Administered 2022-06-02: 40 meq via ORAL
  Filled 2022-06-02: qty 2

## 2022-06-02 MED ORDER — ATORVASTATIN CALCIUM 40 MG PO TABS
40.0000 mg | ORAL_TABLET | Freq: Every day | ORAL | Status: DC
  Administered 2022-06-02 – 2022-06-03 (×2): 40 mg via ORAL
  Filled 2022-06-02 (×2): qty 1

## 2022-06-02 MED ORDER — CARVEDILOL 12.5 MG PO TABS
25.0000 mg | ORAL_TABLET | Freq: Two times a day (BID) | ORAL | Status: DC
  Administered 2022-06-02 – 2022-06-03 (×3): 25 mg via ORAL
  Filled 2022-06-02 (×3): qty 2

## 2022-06-02 MED ORDER — ACETAMINOPHEN 325 MG PO TABS: 650.0000 mg | ORAL_TABLET | Freq: Four times a day (QID) | ORAL | Status: DC | PRN

## 2022-06-02 MED ORDER — INSULIN ASPART 100 UNIT/ML IJ SOLN: 0.0000 [IU] | Freq: Every day | INTRAMUSCULAR | Status: DC

## 2022-06-02 MED ORDER — ONDANSETRON HCL 4 MG PO TABS: 4.0000 mg | ORAL_TABLET | Freq: Four times a day (QID) | ORAL | Status: DC | PRN

## 2022-06-02 MED ORDER — ONDANSETRON HCL 4 MG/2ML IJ SOLN: 4.0000 mg | Freq: Four times a day (QID) | INTRAMUSCULAR | Status: DC | PRN

## 2022-06-02 MED ORDER — SODIUM CHLORIDE 0.9 % IV SOLN
INTRAVENOUS | Status: DC
Start: 2022-06-02 — End: 2022-06-03

## 2022-06-02 MED ORDER — SPIRONOLACTONE 25 MG PO TABS
25.0000 mg | ORAL_TABLET | Freq: Every morning | ORAL | Status: DC
  Filled 2022-06-02: qty 1

## 2022-06-02 MED ORDER — LIDOCAINE VISCOUS HCL 2 % MT SOLN
15.0000 mL | Freq: Once | OROMUCOSAL | Status: AC
  Administered 2022-06-02: 15 mL via ORAL
  Filled 2022-06-02: qty 15

## 2022-06-02 MED ORDER — INSULIN ASPART 100 UNIT/ML IJ SOLN
0.0000 [IU] | Freq: Three times a day (TID) | INTRAMUSCULAR | Status: DC
  Administered 2022-06-02: 2 [IU] via SUBCUTANEOUS
  Administered 2022-06-02: 3 [IU] via SUBCUTANEOUS
  Administered 2022-06-03: 2 [IU] via SUBCUTANEOUS

## 2022-06-02 MED ORDER — PANTOPRAZOLE SODIUM 40 MG PO TBEC
40.0000 mg | DELAYED_RELEASE_TABLET | Freq: Every day | ORAL | Status: DC
  Administered 2022-06-02 – 2022-06-03 (×2): 40 mg via ORAL
  Filled 2022-06-02 (×2): qty 1

## 2022-06-02 NOTE — Assessment & Plan Note (Signed)
-   Potassium 2.7 - 40 mEq ordered in the ED - Another 40 mEq ordered at admission - Trend in the a.m. with magnesium - Likely contributory to dizziness

## 2022-06-02 NOTE — Plan of Care (Signed)
  Problem: Acute Rehab PT Goals(only PT should resolve) Goal: Pt Will Go Supine/Side To Sit Outcome: Progressing Flowsheets (Taken 06/02/2022 1218) Pt will go Supine/Side to Sit: Independently Goal: Patient Will Transfer Sit To/From Stand Outcome: Progressing Flowsheets (Taken 06/02/2022 1218) Patient will transfer sit to/from stand: with supervision Goal: Pt Will Transfer Bed To Chair/Chair To Bed Outcome: Progressing Flowsheets (Taken 06/02/2022 1218) Pt will Transfer Bed to Chair/Chair to Bed: with supervision Goal: Pt Will Ambulate Outcome: Progressing Flowsheets (Taken 06/02/2022 1218) Pt will Ambulate:  100 feet  with supervision

## 2022-06-02 NOTE — Progress Notes (Signed)
ASSUMPTION OF CARE NOTE   06/02/2022 2:50 PM  [name] was seen and examined.  The H&P by the admitting provider, orders, imaging was reviewed.  Please see new orders.  Will continue to follow.   Vitals:   06/02/22 0436 06/02/22 1413  BP: (!) 114/57 109/62  Pulse: (!) 59 60  Resp: 18 18  Temp: 98.7 F (37.1 C) 97.9 F (36.6 C)  SpO2: 96% 95%    Results for orders placed or performed during the hospital encounter of 06/01/22  Basic metabolic panel  Result Value Ref Range   Sodium 138 135 - 145 mmol/L   Potassium 2.7 (LL) 3.5 - 5.1 mmol/L   Chloride 98 98 - 111 mmol/L   CO2 31 22 - 32 mmol/L   Glucose, Bld 90 70 - 99 mg/dL   BUN 46 (H) 8 - 23 mg/dL   Creatinine, Ser 0.25 (H) 0.61 - 1.24 mg/dL   Calcium 8.2 (L) 8.9 - 10.3 mg/dL   GFR, Estimated 40 (L) >60 mL/min   Anion gap 9 5 - 15  CBC  Result Value Ref Range   WBC 6.7 4.0 - 10.5 K/uL   RBC 3.40 (L) 4.22 - 5.81 MIL/uL   Hemoglobin 10.4 (L) 13.0 - 17.0 g/dL   HCT 42.7 (L) 06.2 - 37.6 %   MCV 92.6 80.0 - 100.0 fL   MCH 30.6 26.0 - 34.0 pg   MCHC 33.0 30.0 - 36.0 g/dL   RDW 28.3 15.1 - 76.1 %   Platelets 160 150 - 400 K/uL   nRBC 0.0 0.0 - 0.2 %  Urinalysis, Routine w reflex microscopic Urine, Clean Catch  Result Value Ref Range   Color, Urine STRAW (A) YELLOW   APPearance CLEAR CLEAR   Specific Gravity, Urine 1.010 1.005 - 1.030   pH 6.0 5.0 - 8.0   Glucose, UA NEGATIVE NEGATIVE mg/dL   Hgb urine dipstick NEGATIVE NEGATIVE   Bilirubin Urine NEGATIVE NEGATIVE   Ketones, ur NEGATIVE NEGATIVE mg/dL   Protein, ur NEGATIVE NEGATIVE mg/dL   Nitrite NEGATIVE NEGATIVE   Leukocytes,Ua NEGATIVE NEGATIVE  Hepatic function panel  Result Value Ref Range   Total Protein 5.9 (L) 6.5 - 8.1 g/dL   Albumin 3.4 (L) 3.5 - 5.0 g/dL   AST 15 15 - 41 U/L   ALT 13 0 - 44 U/L   Alkaline Phosphatase 81 38 - 126 U/L   Total Bilirubin 0.9 0.3 - 1.2 mg/dL   Bilirubin, Direct 0.1 0.0 - 0.2 mg/dL   Indirect Bilirubin 0.8 0.3 -  0.9 mg/dL  Magnesium  Result Value Ref Range   Magnesium 2.0 1.7 - 2.4 mg/dL  Hemoglobin and hematocrit, blood  Result Value Ref Range   Hemoglobin 10.2 (L) 13.0 - 17.0 g/dL   HCT 60.7 (L) 37.1 - 06.2 %  CBC with Differential/Platelet  Result Value Ref Range   WBC 5.4 4.0 - 10.5 K/uL   RBC 3.21 (L) 4.22 - 5.81 MIL/uL   Hemoglobin 9.8 (L) 13.0 - 17.0 g/dL   HCT 69.4 (L) 85.4 - 62.7 %   MCV 92.5 80.0 - 100.0 fL   MCH 30.5 26.0 - 34.0 pg   MCHC 33.0 30.0 - 36.0 g/dL   RDW 03.5 00.9 - 38.1 %   Platelets 133 (L) 150 - 400 K/uL   nRBC 0.0 0.0 - 0.2 %   Neutrophils Relative % 57 %   Neutro Abs 3.1 1.7 - 7.7 K/uL   Lymphocytes Relative 27 %  Lymphs Abs 1.5 0.7 - 4.0 K/uL   Monocytes Relative 13 %   Monocytes Absolute 0.7 0.1 - 1.0 K/uL   Eosinophils Relative 2 %   Eosinophils Absolute 0.1 0.0 - 0.5 K/uL   Basophils Relative 1 %   Basophils Absolute 0.0 0.0 - 0.1 K/uL   Immature Granulocytes 0 %   Abs Immature Granulocytes 0.02 0.00 - 0.07 K/uL  TSH  Result Value Ref Range   TSH 3.290 0.350 - 4.500 uIU/mL  Hemoglobin A1c  Result Value Ref Range   Hgb A1c MFr Bld 7.5 (H) 4.8 - 5.6 %   Mean Plasma Glucose 168.55 mg/dL  Glucose, capillary  Result Value Ref Range   Glucose-Capillary 123 (H) 70 - 99 mg/dL  Comprehensive metabolic panel  Result Value Ref Range   Sodium 139 135 - 145 mmol/L   Potassium 3.0 (L) 3.5 - 5.1 mmol/L   Chloride 101 98 - 111 mmol/L   CO2 30 22 - 32 mmol/L   Glucose, Bld 117 (H) 70 - 99 mg/dL   BUN 43 (H) 8 - 23 mg/dL   Creatinine, Ser 2.70 (H) 0.61 - 1.24 mg/dL   Calcium 8.3 (L) 8.9 - 10.3 mg/dL   Total Protein 5.6 (L) 6.5 - 8.1 g/dL   Albumin 3.1 (L) 3.5 - 5.0 g/dL   AST 15 15 - 41 U/L   ALT 10 0 - 44 U/L   Alkaline Phosphatase 78 38 - 126 U/L   Total Bilirubin 0.8 0.3 - 1.2 mg/dL   GFR, Estimated 48 (L) >60 mL/min   Anion gap 8 5 - 15  Magnesium  Result Value Ref Range   Magnesium 2.1 1.7 - 2.4 mg/dL  Glucose, capillary  Result Value Ref  Range   Glucose-Capillary 80 70 - 99 mg/dL   Comment 1 Notify RN    Comment 2 Document in Chart   Glucose, capillary  Result Value Ref Range   Glucose-Capillary 129 (H) 70 - 99 mg/dL   Comment 1 Notify RN    Comment 2 Document in Chart   CBG monitoring, ED  Result Value Ref Range   Glucose-Capillary 66 (L) 70 - 99 mg/dL  Troponin I (High Sensitivity)  Result Value Ref Range   Troponin I (High Sensitivity) 10 <18 ng/L  Troponin I (High Sensitivity)  Result Value Ref Range   Troponin I (High Sensitivity) 10 <18 ng/L   C. Laural Benes, MD [facility]   06/01/2022  7:32 PM How to contact the [facility] Attending or Consulting provider 7A - 7P or covering provider during after hours 7P -7A, for this patient?  Check the care team in [facility] and look for a) attending/consulting [facility] provider listed and b) the [facility] team listed Log into [email or web address] and use [facility] universal password to access. If you do not have the password, please contact the hospital operator. Locate the [facility] provider you are looking for under [facility] and page to a number that you can be directly reached. If you still have difficulty reaching the provider, please page the [facility] (Director on Call) for the Hospitalists listed on amion for assistance.

## 2022-06-02 NOTE — Assessment & Plan Note (Signed)
-  Creatinine increased from 1.15>> 1.78 -BUN is 46, likely related to dehydration -Patient has had poor intake since his wife died on 05-13-23-Hold Lasix -Gentle hydration -Hold nephrotoxic agents when possible -Continue to monitor

## 2022-06-02 NOTE — Assessment & Plan Note (Signed)
-   Right-sided weakness - Chronic pain to the right side - Continue statin, continue blood pressure control

## 2022-06-02 NOTE — Assessment & Plan Note (Signed)
-   Continue beta-blocker, statin, spironolactone -Holding Lasix -Continue to monitor

## 2022-06-02 NOTE — Assessment & Plan Note (Addendum)
-   Continue as needed meclizine -Likely multifactorial including dehydration and hypokalemia -Fluids and replace electrolytes as indicated -CTA head and neck head no acute findings

## 2022-06-02 NOTE — Assessment & Plan Note (Signed)
-   History of anemia - Hemoglobin dropped from 14.3>> 10.4 - No signs or symptoms of bleeding - Trend at midnight and if continuing to drop, consider further work-up

## 2022-06-02 NOTE — H&P (Signed)
History and Physical    Patient: [name]:[id number] DOB: 07-Aug-1946 DOA: 06/01/2022 DOS: the patient was seen and examined on 06/02/2022 PCP: Jason Coop, FNP  Patient coming from: Home  Chief Complaint:  Chief Complaint  Patient presents with   Dizziness   HPI: [name] is a 76 y.o. male with medical history significant of AICD, coronary artery disease, CHF, history of GI bleed, hypertension, paroxysmal atrial fibrillation, stroke with right-sided deficits, and more presents to the ED with a chief complaint of dizziness and fall.  Patient reports that he has had dizziness going on for some time but today to be worse.  He could not stand up.  He ended up falling.  When he fell he was falling forward, does not remember exactly what he landed on.  He thinks he put his hands out to break his fall.  He has no new pain in his wrists or hands at this time.  Patient reports that he describes dizziness as feeling off balance, not the room spinning around him.  He is prescribed meclizine for it and it usually helps but it did not help today.  Patient reports he has had a decreased appetite since his wife died on 17-May-2023.  He is not sure if he had weight loss.  Is also been nauseous intermittently.  He has not thrown up.  Last bowel movement was normal and it was yesterday.  He does sometimes get constipation requiring laxative most likely secondary to his opiate.  Patient takes oxycodone chronically for right-sided pain related to history of stroke.  Patient has no other complaints at this time.  Patient does not smoke, does not drink, does not use illicit drugs.  He is vaccinated for COVID.  Patient is full code. Review of Systems: As mentioned in the history of present illness. All other systems reviewed and are negative. Past Medical History:  Diagnosis Date   Anemia    Multifactorial  - hx of B12 def and iron deficiency, followed by heme, followed at Cancer center-  [facility]   Automatic implantable cardioverter-defibrillator in situ    CAD (coronary artery disease)    a. STEMI s/p CABG 07/2007 (had preop cardiogenic shock, IABP, VDRF before surgery)   Cardiomyopathy, ischemic    a. Chronic systolic CHF s/p St. Jude dual chamber ICD 03/2008.   Carotid stenosis    a. Prior carotid dz/ surgery. b. Carotid dopp 07/2011 - 0-39% bilaterally.;   c. Doppler 11/13: 40-59% RICA, 0-39% LICA   Chronic renal insufficiency    Diabetes mellitus    GI bleed    Reported history   Hx of CABG    Hypertension    [place] [facility] care    Mitral regurgitation    Myocardial infarction [facility])    Neuromuscular disorder (HCC)    R sided weakness    Osteoarthritis    PAF (paroxysmal atrial fibrillation) (HCC)    a. Poor Coumadin candidate due to hx of GI bleed.   Sinus bradycardia    Sleep apnea    study done, 10 yrs ago, unable to tolerate CPAP   Splenomegaly    Stroke [facility])    a. CVA 1990s - chronic pain in RUE after stroke.   Systolic CHF, chronic (HCC)    Previously taken off lisinopril by primary doctor due to labs   Tobacco abuse    Past Surgical History:  Procedure Laterality Date   CARDIAC DEFIBRILLATOR PLACEMENT  04/05/2008  Implantation of a St. Jude dual-chamber defibrillator, Doylene Canning. Ladona Ridgel , MD   CARDIAC DEFIBRILLATOR PLACEMENT     CHOLECYSTECTOMY N/A 02/19/2016   Procedure: LAPAROSCOPIC CHOLECYSTECTOMY;  Surgeon: Abigail Miyamoto, MD;  Location: [facility] OR;  Service: General;  Laterality: N/A;   COLONOSCOPY  07/2007   Rourk: left-sided diverticulum. TI normal. No etiology for IDA.   CORONARY ARTERY BYPASS GRAFT     CORONARY ARTERY BYPASS GRAFT  08/29/2007   x3; Marilu Favre H. Cornelius Moras MD   EP IMPLANTABLE DEVICE N/A 10/05/2015   Procedure: ICD Generator Changeout;  Surgeon: Marinus Maw, MD;  Location: [facility] INVASIVE CV LAB;  Service: Cardiovascular;  Laterality: N/A;   ERCP N/A 02/18/2016   Procedure: ENDOSCOPIC RETROGRADE CHOLANGIOPANCREATOGRAPHY (ERCP);  Surgeon:  Vida Rigger, MD;  Location: [facility] ENDOSCOPY;  Service: Endoscopy;  Laterality: N/A;   ESOPHAGOGASTRODUODENOSCOPY  07/2007   Rourk: small hh, atrophic gastric mucosa, poor distensibility of stomach ?extrinsic compression, CT showed mild to moderate splenomegaly. No etiology for IDA.   JOINT REPLACEMENT  1990's   L knee   LAPAROSCOPIC APPENDECTOMY N/A 09/14/2017   Procedure: APPENDECTOMY LAPAROSCOPIC;  Surgeon: Jimmye Norman, MD;  Location: [facility] OR;  Service: General;  Laterality: N/A;   Percutaneous coronary intervention  01/10/2005   Charlies Constable, MD   TONSILLECTOMY     TOTAL KNEE ARTHROPLASTY Right 08/16/2013   Procedure: RIGHT TOTAL KNEE ARTHROPLASTY;  Surgeon: Kathryne Hitch, MD;  Location[facility] OR;  Service: Orthopedics;  Laterality: Right;   Social History:  reports that he quit smoking about 31 years ago. His smoking use included cigarettes. He has a 15.00 pack-year smoking history. He quit smokeless tobacco use about 33 years ago. He reports that he does not drink alcohol and does not use drugs.  Allergies  Allergen Reactions   Codeine Nausea And Vomiting    Family History  Problem Relation Age of Onset   Heart attack Mother        CVA, MI   Diabetes Mother    Heart attack Father        MI   Diabetes Sister    Coronary artery disease Brother        CABG    Prior to Admission medications   Medication Sig Start Date End Date Taking? Authorizing Provider  acetaminophen (TYLENOL) 500 MG tablet Take 1 tablet (500 mg total) by mouth every 6 (six) hours as needed. 12/22/21   Horton, Mayer Masker, MD  atorvastatin (LIPITOR) 40 MG tablet  07/16/20   [provider]  baclofen (LIORESAL) 20 MG tablet Take 40 mg by mouth at bedtime.     [provider]  carvedilol (COREG) 25 MG tablet Take 1 tablet (25 mg total) by mouth 2 (two) times daily with a meal. 09/12/20   Marinus Maw, MD  cyclobenzaprine (FLEXERIL) 10 MG tablet Take 1 tablet (10 mg total) by mouth 2 (two)  times daily as needed for muscle spasms. 12/22/21   Horton, Mayer Masker, MD  doxycycline (VIBRA-TABS) 100 MG tablet Take 1 tablet (100 mg total) by mouth 2 (two) times daily. 04/02/21   Park Liter, DPM  DULoxetine (CYMBALTA) 30 MG capsule duloxetine 30 mg capsule,delayed release  Take 1 capsule every day by oral route.    [provider]  furosemide (LASIX) 40 MG tablet Take 40 mg by mouth daily as needed for fluid.     [provider]  glimepiride (AMARYL) 4 MG tablet Take 4 mg by mouth daily with breakfast.  [provider]  linagliptin (TRADJENTA) 5 MG TABS tablet Take 5 mg by mouth daily after breakfast.     [provider]  meclizine (ANTIVERT) 25 MG tablet Take 25 mg by mouth 4 (four) times daily as needed. For dizziness 02/26/12   [provider]  metFORMIN (GLUCOPHAGE) 500 MG tablet  08/26/20   [provider]  oxyCODONE-acetaminophen (PERCOCET/ROXICET) 5-325 MG tablet Take a half tablet every 6 hours 04/18/19   [provider]  rivaroxaban (XARELTO) 20 MG TABS tablet TAKE 1 TABLET BY MOUTH ONCE DAILY WITH SUPPER 12/30/21   Evans Lance, MD  SOLIQUA 100-33 UNT-MCG/ML SOPN  07/17/20   [provider]  spironolactone (ALDACTONE) 25 MG tablet Take 1 tablet (25 mg total) by mouth every morning. 07/20/14   Evans Lance, MD  tamsulosin (FLOMAX) 0.4 MG CAPS capsule tamsulosin 0.4 mg capsule  TAKE 1 CAPSULE BY MOUTH ONCE DAILY    [provider]  triamcinolone cream (KENALOG) 0.5 % triamcinolone acetonide 0.5 % topical cream  APPLY CREAM TOPICALLY TO AFFECTED AREA TWICE DAILY    [provider]    Physical Exam: Vitals:   06/01/22 2100 06/01/22 2130 06/01/22 2200 06/01/22 2230  BP: (!) 117/58 119/60 (!) 124/59 (!) 123/59  Pulse: 60 60 (!) 59 60  Resp: 20 13 18 19   Temp:      SpO2: 98% 98% 98% 97%  Weight:      Height:       1.  General: Patient lying supine in bed,  no acute distress   2.  Psychiatric: Alert and oriented x 3, mood and behavior normal for situation, pleasant and cooperative with exam   3. Neurologic: Speech and language are normal, face is symmetric, moves all 4 extremities voluntarily, right-sided weakness compared to left, at baseline without acute deficits on limited exam   4. HEENMT:  Head is atraumatic, normocephalic, pupils reactive to light, neck is supple, trachea is midline, mucous membranes are moist   5. Respiratory : Lungs are clear to auscultation bilaterally without wheezing, rhonchi, rales, no cyanosis, no increase in work of breathing or accessory muscle use   6. Cardiovascular : Heart rate normal, rhythm is regular, no rubs or gallops, chronic edema present, peripheral pulses palpated   7. Gastrointestinal:  Abdomen is soft, nondistended, nontender to palpation bowel sounds active, no masses or organomegaly palpated   8. Skin:  Skin is warm, dry and intact without rashes, acute lesions, or ulcers on limited exam   9.Musculoskeletal:  No acute deformities or trauma, no asymmetry in tone, chronic edema present greater on right than left, peripheral pulses palpated, no tenderness to palpation in the extremities  Data Reviewed:   Assessment and Plan: * AKI (acute kidney injury) ([place]) -Creatinine increased from 1.15>> 1.78 -BUN is 46, likely related to dehydration -Patient has had poor intake since his wife died on 05-22-23 -Hold Lasix -Gentle hydration -Hold nephrotoxic agents when possible -Continue to monitor  Chronic pain - Continue Cymbalta -Continue as needed oxycodone  History of CVA (cerebrovascular accident) - Right-sided weakness - Chronic pain to the right side - Continue statin, continue blood pressure control  Dizziness - Continue as needed meclizine -Likely multifactorial including dehydration and hypokalemia -Fluids and replace electrolytes as indicated -CTA head and neck head no acute findings  CHF  (congestive heart failure) (HCC) - Continue beta-blocker, statin, spironolactone -Holding Lasix -Continue to monitor  Hypokalemia - Potassium 2.7 - 40 mEq ordered in the ED -  Another 40 mEq ordered at admission - Trend in the a.m. with magnesium - Likely contributory to dizziness  Paroxysmal atrial fibrillation (HCC) - Continue beta-blocker - Continue Xarelto - AICD in place  Essential hypertension - Continue Coreg  Iron deficiency anemia - History of anemia - Hemoglobin dropped from 14.3>> 10.4 - No signs or symptoms of bleeding - Trend at midnight and if continuing to drop, consider further work-up  DM (diabetes mellitus), secondary, with peripheral vascular complications (HCC) -Hold oral hypoglycemics -Sliding scale coverage -Last hemoglobin A1c was 8.0 -Update hemoglobin A1c -Monitor CBGs      Advance Care Planning:   Code Status: Prior full  Consults: none  Family Communication: Daughters is at bedside  Severity of Illness: The appropriate patient status for this patient is OBSERVATION. Observation status is judged to be reasonable and necessary in order to provide the required intensity of service to ensure the patient's safety. The patient's presenting symptoms, physical exam findings, and initial radiographic and laboratory data in the context of their medical condition is felt to place them at decreased risk for further clinical deterioration. Furthermore, it is anticipated that the patient will be medically stable for discharge from the hospital within 2 midnights of admission.   Author: Rolla Plate, DO 06/02/2022 12:41 AM  For on call review [email or web address].

## 2022-06-02 NOTE — Assessment & Plan Note (Signed)
-  Continue Coreg 

## 2022-06-02 NOTE — Evaluation (Signed)
Physical Therapy Evaluation Patient Details Name: [name] MRN: [id number] DOB: Nov 21, 1945 Today's Date: 06/02/2022  History of Present Illness  [name] is a 76 y.o. male with medical history significant of AICD, coronary artery disease, CHF, history of GI bleed, hypertension, paroxysmal atrial fibrillation, stroke with right-sided deficits, and more presents to the ED with a chief complaint of dizziness and fall.  Patient reports that he has had dizziness going on for some time but today to be worse.  He could not stand up.  He ended up falling.  When he fell he was falling forward, does not remember exactly what he landed on.  He thinks he put his hands out to break his fall.  He has no new pain in his wrists or hands at this time.  Patient reports that he describes dizziness as feeling off balance, not the room spinning around him.  He is prescribed meclizine for it and it usually helps but it did not help today.  Patient reports he has had a decreased appetite since his wife died on 05/22/23.  He is not sure if he had weight loss.  Is also been nauseous intermittently.  He has not thrown up.  Last bowel movement was normal and it was yesterday.  He does sometimes get constipation requiring laxative most likely secondary to his opiate.  Patient takes oxycodone chronically for right-sided pain related to history of stroke.  Patient has no other complaints at this time    Clinical Impression  Patient in bed on therapist arrival; pleasant and agreeable to physical therapy.  Patient performs supine to sit modified independently; takes extra time to perform.  Able to sit on edge of bed with feet supported with SBA from therapist; forward flexed trunk noted.  Sit to stand from slightly elevated bed with CGA for safety and patient able to ambulate x 60 ft with hand held assist only; decreased step length and slight drop foot noted right lower extremity present prior to hospitalization from old  CVA.  Able to return to bed without assistance.  Patient reports feeling generally weak although improved.  No reports of light headness today during treatment. Patient will benefit from continued skilled therapy interventions during the remainder of his hospital stay and at the next recommended venue of care to address deficits and promote return to optimal function.      Recommendations for follow up therapy are one component of a multi-disciplinary discharge planning process, led by the attending physician.  Recommendations may be updated based on patient status, additional functional criteria and insurance authorization.  Follow Up Recommendations Home health PT      Assistance Recommended at Discharge Set up Supervision/Assistance  Patient can return home with the following  A little help with walking and/or transfers;A little help with bathing/dressing/bathroom;Help with stairs or ramp for entrance    Equipment Recommendations None recommended by PT  Recommendations for Other Services       Functional Status Assessment Patient has had a recent decline in their functional status and demonstrates the ability to make significant improvements in function in a reasonable and predictable amount of time.     Precautions / Restrictions Precautions Precautions: Fall Restrictions Weight Bearing Restrictions: No      Mobility  Bed Mobility Overal bed mobility: Modified Independent             General bed mobility comments: takes extra time to come to edge of bed Patient Response: Cooperative  Transfers  Overall transfer level: Needs assistance   Transfers: Sit to/from Stand             General transfer comment: sit to stand with CGA; min guard assist from elevated bed    Ambulation/Gait Ambulation/Gait assistance: Min guard Gait Distance (Feet): 60 Feet Assistive device: 1 person hand held assist Gait Pattern/deviations: Decreased step length - right        General Gait Details: decreased gait speed; slight toe drop right  Stairs            Wheelchair Mobility    Modified Rankin (Stroke Patients Only)       Balance Overall balance assessment: Needs assistance Sitting-balance support: Feet supported Sitting balance-Leahy Scale: Good Sitting balance - Comments: good sitting balance on EOB with feet supported   Standing balance support: During functional activity, Single extremity supported Standing balance-Leahy Scale: Fair Standing balance comment: fair to good standing balance with hand held assistance                             Pertinent Vitals/Pain Pain Assessment Pain Assessment: No/denies pain    Home Living Family/patient expects to be discharged to:: Private residence Living Arrangements: Alone Available Help at Discharge: Family;Available 24 hours/day Type of Home: House Home Access: Ramped entrance       Home Layout: One level Home Equipment: Wheelchair - Forensic psychologist (2 wheels);Cane - single point;BSC/3in1;Shower seat      Prior Function Prior Level of Function : Independent/Modified Independent                     Hand Dominance   Dominant Hand: Left    Extremity/Trunk Assessment   Upper Extremity Assessment Upper Extremity Assessment: RUE deficits/detail RUE Deficits / Details: weakness due to old CVA    Lower Extremity Assessment Lower Extremity Assessment: Generalized weakness;RLE deficits/detail RLE Deficits / Details: weakness due to old CVA    Cervical / Trunk Assessment Cervical / Trunk Assessment: Normal  Communication   Communication: No difficulties  Cognition Arousal/Alertness: Awake/alert   Overall Cognitive Status: Within Functional Limits for tasks assessed                                          General Comments      Exercises     Assessment/Plan    PT Assessment Patient needs continued PT services  PT Problem List  Decreased strength;Decreased activity tolerance;Decreased balance;Decreased mobility       PT Treatment Interventions Gait training;Balance training;Functional mobility training;Therapeutic activities;Therapeutic exercise;Patient/family education;Neuromuscular re-education    PT Goals (Current goals can be found in the Care Plan section)  Acute Rehab PT Goals Patient Stated Goal: return home PT Goal Formulation: With patient Time For Goal Achievement: 06/16/22 Potential to Achieve Goals: Good    Frequency Min 2X/week     Co-evaluation               AM-PAC PT "6 Clicks" Mobility  Outcome Measure Help needed turning from your back to your side while in a flat bed without using bedrails?: None Help needed moving from lying on your back to sitting on the side of a flat bed without using bedrails?: None Help needed moving to and from a bed to a chair (including a wheelchair)?: A Little Help needed standing up from a chair  using your arms (e.g., wheelchair or bedside chair)?: A Little Help needed to walk in hospital room?: A Little Help needed climbing 3-5 steps with a railing? : A Lot 6 Click Score: 19    End of Session   Activity Tolerance: Patient tolerated treatment well;No increased pain Patient left: in bed;with call bell/phone within reach;with bed alarm set   PT Visit Diagnosis: Unsteadiness on feet (R26.81);Other abnormalities of gait and mobility (R26.89);Muscle weakness (generalized) (M62.81);History of falling (Z91.81)    Time: 0946-1000 PT Time Calculation (min) (ACUTE ONLY): 14 min   Charges:   PT Evaluation $PT Eval Low Complexity: 1 Low PT Treatments $Therapeutic Activity: 8-22 mins        12:18 PM, 06/02/22 Cammy Sanjurjo Small Edin Skarda MPT [place] [facility] [place] [phone] [phone]

## 2022-06-02 NOTE — Assessment & Plan Note (Signed)
-   Continue Cymbalta -Continue as needed oxycodone

## 2022-06-02 NOTE — Assessment & Plan Note (Signed)
-   Continue beta-blocker - Continue Xarelto - AICD in place

## 2022-06-02 NOTE — TOC Progression Note (Signed)
Transition of Care [facility]) - Progression Note    Patient Details  Name: CARSTON RIEDL MRN: [id number] Date of Birth: 1946-06-02  [facility] CM/SW Contact  Karn Cassis, [place] Phone Number: 06/02/2022, 1:14 PM  Clinical Narrative: PT recommending home health. Discussed with pt who is agreeable with no preference on agency. Referred and accepted by Clifton Custard with [facility]. Anticipate d/c tomorrow.         Barriers to Discharge: Continued Medical Work up  Expected Discharge Plan and Services                                     HH Arranged: PT [facility] Agency: [facility] Date [facility] Agency Contacted: 06/02/22 Time HH Agency Contacted: [phone] Representative spoke with at [facility] Agency: Clifton Custard   Social Determinants of Health (SDOH) Interventions    Readmission Risk Interventions     No data to display

## 2022-06-02 NOTE — Assessment & Plan Note (Signed)
-  Hold oral hypoglycemics -Sliding scale coverage -Last hemoglobin A1c was 8.0 -Update hemoglobin A1c -Monitor CBGs

## 2022-06-03 DIAGNOSIS — N179 Acute kidney failure, unspecified: Secondary | ICD-10-CM | POA: Diagnosis not present

## 2022-06-03 DIAGNOSIS — R42 Dizziness and giddiness: Secondary | ICD-10-CM | POA: Diagnosis not present

## 2022-06-03 DIAGNOSIS — I10 Essential (primary) hypertension: Secondary | ICD-10-CM | POA: Diagnosis not present

## 2022-06-03 DIAGNOSIS — E876 Hypokalemia: Secondary | ICD-10-CM | POA: Diagnosis not present

## 2022-06-03 LAB — BASIC METABOLIC PANEL
Anion gap: 4 — ABNORMAL LOW (ref 5–15)
BUN: 27 mg/dL — ABNORMAL HIGH (ref 8–23)
CO2: 28 mmol/L (ref 22–32)
Calcium: 7.4 mg/dL — ABNORMAL LOW (ref 8.9–10.3)
Chloride: 110 mmol/L (ref 98–111)
Creatinine, Ser: 0.92 mg/dL (ref 0.61–1.24)
GFR, Estimated: >60 mL/min (ref >60)
Glucose, Bld: 112 mg/dL — ABNORMAL HIGH (ref 70–99)
Potassium: 3.2 mmol/L — ABNORMAL LOW (ref 3.5–5.1)
Sodium: 142 mmol/L (ref 135–145)

## 2022-06-03 LAB — GLUCOSE, CAPILLARY: Glucose-Capillary: 133 mg/dL — ABNORMAL HIGH (ref 70–99)

## 2022-06-03 LAB — MAGNESIUM: Magnesium: 2 mg/dL (ref 1.7–2.4)

## 2022-06-03 MED ORDER — FUROSEMIDE 20 MG PO TABS: 20.0000 mg | ORAL_TABLET | Freq: Two times a day (BID) | ORAL | Status: AC

## 2022-06-03 MED ORDER — POTASSIUM CHLORIDE CRYS ER 20 MEQ PO TBCR
40.0000 meq | EXTENDED_RELEASE_TABLET | Freq: Once | ORAL | Status: AC
  Administered 2022-06-03: 40 meq via ORAL
  Filled 2022-06-03: qty 2

## 2022-06-03 MED ORDER — GLIMEPIRIDE 4 MG PO TABS
4.0000 mg | ORAL_TABLET | Freq: Every day | ORAL | Status: AC
Start: 2022-06-05 — End: ?

## 2022-06-03 NOTE — Discharge Summary (Signed)
Physician Discharge Summary  [name] MIW:[id number] DOB: October 01, 1945 DOA: 06/01/2022  PCP: Jason Coop, FNP  Admit date: 06/01/2022 Discharge date: 06/03/2022  Admitted From:  HOME  Disposition: HOME   Recommendations for Outpatient Follow-up:  Follow up with PCP in 1 weeks Please obtain BMP in one week to follow up renal function, potassium  Home Health: PT    Discharge Condition: STABLE   CODE STATUS: FULL DIET: HEART HEALTHY   Brief Hospitalization Summary: Please see all hospital notes, images, labs for full details of the hospitalization. HPI:  76 y.o. male with medical history significant of AICD, coronary artery disease, CHF, history of GI bleed, hypertension, paroxysmal atrial fibrillation, stroke with right-sided deficits, and more presents to the ED with a chief complaint of dizziness and fall.  Patient reports that he has had dizziness going on for some time but today to be worse.  He could not stand up.  He ended up falling.  When he fell he was falling forward, does not remember exactly what he landed on.  He thinks he put his hands out to break his fall.  He has no new pain in his wrists or hands at this time.  Patient reports that he describes dizziness as feeling off balance, not the room spinning around him.  He is prescribed meclizine for it and it usually helps but it did not help today.  Patient reports he has had a decreased appetite since his wife died on 06/04/2023.  He is not sure if he had weight loss.  Is also been nauseous intermittently.  He has not thrown up.  Last bowel movement was normal and it was yesterday.  He does sometimes get constipation requiring laxative most likely secondary to his opiate.  Patient takes oxycodone chronically for right-sided pain related to history of stroke.  Patient has no other complaints at this time.   Patient does not smoke, does not drink, does not use illicit drugs.  He is vaccinated for COVID.  Patient is full  code.  HOSPITAL COURSE   The patient was admitted with acute kidney injury secondary to dehydration and likely secondary to diuretic use.  His diuretics were held.  He was given gentle IV fluid hydration and his electrolytes were replaced.  He was noted to be hypokalemic requiring supplemental potassium.  He was evaluated by physical therapy and they felt that he would benefit from home health physical therapy which has been ordered arranged.  His AKI has resolved after IV fluid hydration and his renal function is normalized.  He is feeling much better.  He was given an extra dose of potassium prior to discharge.  We will have him hold his Lasix for the next several days before restarting to give him more time for his body to rehydrate.  He is stable to discharge home today and follow-up with his primary care provider.  His PCP should recheck his BMP in 1 to 2 weeks to follow-up renal function and potassium.  Discharge Diagnoses:  Principal Problem:   AKI (acute kidney injury) (HCC) Active Problems:   DM (diabetes mellitus), secondary, with peripheral vascular complications (HCC)   Iron deficiency anemia   Essential hypertension   Paroxysmal atrial fibrillation (HCC)   Hypokalemia   CHF (congestive heart failure) (HCC)   Dizziness   History of CVA (cerebrovascular accident)   Chronic pain   Anemia   Discharge Instructions:  Allergies as of 06/03/2022  Reactions   Codeine Nausea And Vomiting        Medication List     TAKE these medications    acetaminophen 500 MG tablet Commonly known as: TYLENOL Take 1 tablet (500 mg total) by mouth every 6 (six) hours as needed.   atorvastatin 40 MG tablet Commonly known as: LIPITOR Take 40 mg by mouth daily.   baclofen 20 MG tablet Commonly known as: LIORESAL Take 40 mg by mouth at bedtime.   carvedilol 25 MG tablet Commonly known as: Coreg Take 1 tablet (25 mg total) by mouth 2 (two) times daily with a meal.    cyclobenzaprine 10 MG tablet Commonly known as: FLEXERIL Take 1 tablet (10 mg total) by mouth 2 (two) times daily as needed for muscle spasms.   DULoxetine 30 MG capsule Commonly known as: CYMBALTA Take 30 mg by mouth daily.   furosemide 20 MG tablet Commonly known as: LASIX Take 1 tablet (20 mg total) by mouth 2 (two) times daily. Start taking on: June 06, 2022 What changed: These instructions start on June 06, 2022. If you are unsure what to do until then, ask your doctor or other care provider.   glimepiride 4 MG tablet Commonly known as: AMARYL Take 1 tablet (4 mg total) by mouth daily with breakfast. Start taking on: June 05, 2022 What changed: These instructions start on June 05, 2022. If you are unsure what to do until then, ask your doctor or other care provider.   linagliptin 5 MG Tabs tablet Commonly known as: TRADJENTA Take 5 mg by mouth daily after breakfast.   meclizine 25 MG tablet Commonly known as: ANTIVERT Take 25 mg by mouth 4 (four) times daily as needed. For dizziness   metFORMIN 500 MG tablet Commonly known as: GLUCOPHAGE Take 500 mg by mouth daily with breakfast.   oxyCODONE-acetaminophen 5-325 MG tablet Commonly known as: PERCOCET/ROXICET Take a half tablet every 6 hours   Soliqua 100-33 UNT-MCG/ML Sopn Generic drug: Insulin Glargine-Lixisenatide Inject 45 Units into the skin daily.   tamsulosin 0.4 MG Caps capsule Commonly known as: FLOMAX Take 0.4 mg by mouth daily.   triamcinolone cream 0.5 % Commonly known as: KENALOG triamcinolone acetonide 0.5 % topical cream  APPLY CREAM TOPICALLY TO AFFECTED AREA TWICE DAILY   Xarelto 20 MG Tabs tablet Generic drug: rivaroxaban TAKE 1 TABLET BY MOUTH ONCE DAILY WITH SUPPER        Follow-up Information     [facility].   Specialty: Home Health Services Why: Will contact you to schedule home health visits. Contact information: [address] [place] [address] [phone]         Jason Coop, FNP. Schedule an appointment as soon as possible for a visit in 1 week(s).   Specialty: Family Medicine Why: Hospital Follow Up Contact information: [facility] [address] [place] [address] [phone]                Allergies  Allergen Reactions   Codeine Nausea And Vomiting   Allergies as of 06/03/2022       Reactions   Codeine Nausea And Vomiting        Medication List     TAKE these medications    acetaminophen 500 MG tablet Commonly known as: TYLENOL Take 1 tablet (500 mg total) by mouth every 6 (six) hours as needed.   atorvastatin 40 MG tablet  Commonly known as: LIPITOR Take 40 mg by mouth daily.   baclofen 20 MG tablet Commonly known as: LIORESAL Take 40 mg by mouth at bedtime.   carvedilol 25 MG tablet Commonly known as: Coreg Take 1 tablet (25 mg total) by mouth 2 (two) times daily with a meal.   cyclobenzaprine 10 MG tablet Commonly known as: FLEXERIL Take 1 tablet (10 mg total) by mouth 2 (two) times daily as needed for muscle spasms.   DULoxetine 30 MG capsule Commonly known as: CYMBALTA Take 30 mg by mouth daily.   furosemide 20 MG tablet Commonly known as: LASIX Take 1 tablet (20 mg total) by mouth 2 (two) times daily. Start taking on: June 06, 2022 What changed: These instructions start on June 06, 2022. If you are unsure what to do until then, ask your doctor or other care provider.   glimepiride 4 MG tablet Commonly known as: AMARYL Take 1 tablet (4 mg total) by mouth daily with breakfast. Start taking on: June 05, 2022 What changed: These instructions start on June 05, 2022. If you are unsure what to do until then, ask your doctor or other care provider.   linagliptin 5 MG Tabs tablet Commonly known as: TRADJENTA Take 5 mg by mouth daily after breakfast.   meclizine 25 MG tablet Commonly  known as: ANTIVERT Take 25 mg by mouth 4 (four) times daily as needed. For dizziness   metFORMIN 500 MG tablet Commonly known as: GLUCOPHAGE Take 500 mg by mouth daily with breakfast.   oxyCODONE-acetaminophen 5-325 MG tablet Commonly known as: PERCOCET/ROXICET Take a half tablet every 6 hours   Soliqua 100-33 UNT-MCG/ML Sopn Generic drug: Insulin Glargine-Lixisenatide Inject 45 Units into the skin daily.   tamsulosin 0.4 MG Caps capsule Commonly known as: FLOMAX Take 0.4 mg by mouth daily.   triamcinolone cream 0.5 % Commonly known as: KENALOG triamcinolone acetonide 0.5 % topical cream  APPLY CREAM TOPICALLY TO AFFECTED AREA TWICE DAILY   Xarelto 20 MG Tabs tablet Generic drug: rivaroxaban TAKE 1 TABLET BY MOUTH ONCE DAILY WITH SUPPER        Procedures/Studies: CT ANGIO HEAD NECK W WO CM  Result Date: 06/01/2022 CLINICAL DATA:  Initial evaluation for neuro deficit, stroke suspected, dizziness. EXAM: CT ANGIOGRAPHY HEAD AND NECK TECHNIQUE: Multidetector CT imaging of the head and neck was performed using the standard protocol during bolus administration of intravenous contrast. Multiplanar CT image reconstructions and MIPs were obtained to evaluate the vascular anatomy. Carotid stenosis measurements (when applicable) are obtained utilizing NASCET criteria, using the distal internal carotid diameter as the denominator. RADIATION DOSE REDUCTION: This exam was performed according to the departmental dose-optimization program which includes automated exposure control, adjustment of the mA and/or kV according to patient size and/or use of iterative reconstruction technique. CONTRAST:  60mL OMNIPAQUE IOHEXOL 350 MG/ML SOLN COMPARISON:  Prior study from 04/27/2019. FINDINGS: CT HEAD FINDINGS Brain: Cerebral volume within normal limits for age. Mild-to-moderate chronic microvascular ischemic disease. Chronic left cerebral infarct with associated ex vacuo dilatation of the left lateral  ventricle, stable. No acute intracranial hemorrhage. No acute large vessel territory infarct. No mass lesion or midline shift. No extra-axial fluid collection. Vascular: No hyperdense vessel. Scattered vascular calcifications noted within the carotid siphons. Skull: Scalp soft tissues demonstrate no acute finding. Calvarium intact. Sinuses/Orbits: Globes and orbital soft tissues within normal limits. Changes of chronic paranasal sinus disease, most pronounced at the right sphenoid sinus. Mastoid air cells are largely clear. Other: None. Review of  the MIP images confirms the above findings CTA NECK FINDINGS Aortic arch: Visualized aortic arch normal caliber with normal 3 vessel morphology. Mild-to-moderate atheromatous change about the arch and origin of the great vessels without hemodynamically significant stenosis. Right carotid system: Right CCA widely patent. Atheromatous change about the right carotid bulb/proximal right ICA with associated stenosis of up to 40% by NASCET criteria. Right ICA patent distally without stenosis or dissection. Left carotid system: Left CCA patent without stenosis. Mild eccentric plaque about the left carotid bulb/proximal left ICA without hemodynamically significant stenosis. Left ICA patent distally without stenosis or dissection. Vertebral arteries: Both vertebral arteries arise from the subclavian arteries. Approximate 70% stenosis noted at the origin of the right subclavian artery (series 9, image 598). Right vertebral artery dominant. Evaluation of the proximal right vertebral artery somewhat limited by streak artifact from adjacent surgical clips. The visualized portions of the right vertebral artery are otherwise patent without stenosis or dissection. The left vertebral artery is occluded at its origin. Distal reconstitution at the distal left V2 segment via cervical collaterals. Left vertebral artery hypoplastic but otherwise patent distally to the skull base. Skeleton: No  discrete or worrisome osseous lesions. Moderate spondylosis present at C5-6. Patient is edentulous. Other neck: No other acute soft tissue abnormality within the neck. Upper chest: Visualized upper chest demonstrates no acute finding. Right-sided pacemaker/AICD noted. Review of the MIP images confirms the above findings CTA HEAD FINDINGS Anterior circulation: Petrous segments patent bilaterally. Scattered atheromatous change within the carotid siphons without hemodynamically significant stenosis. A1 segments patent bilaterally. Normal anterior communicating artery complex. Anterior cerebral arteries patent without stenosis. No M1 stenosis or occlusion. No proximal MCA branch occlusion. Distal MCA branches perfused and symmetric right Posterior circulation: Dominant right V4 segment widely patent. Right PICA patent. Focal moderate stenosis seen about the hypoplastic left vertebral artery at the takeoff of the left PICA. Left PICA remains patent. Short-segment fenestration noted within the left V4 segment distally. Basilar patent to its distal aspect without stenosis. Superior cerebellar and posterior cerebral arteries patent bilaterally. Venous sinuses: Patent allowing for timing the contrast bolus. Anatomic variants: As above.  No aneurysm. Review of the MIP images confirms the above findings IMPRESSION: CT HEAD: 1. No acute intracranial abnormality. 2. Chronic left cerebral ischemic changes with associated ex vacuo dilatation of the left lateral ventricle. 3. Moderate chronic microvascular ischemic disease. 4. Chronic paranasal sinus disease, most pronounced at the right sphenoid sinus. CTA HEAD AND NECK: 1. Negative CTA for acute large vessel occlusion or other emergent finding. 2. 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 Distal reconstitution at the distal left V2 segment the a cervical collaterals. Additional moderate stenosis at the distal left V4 segment. 70% stenosis at the origin of the  right subclavian artery. Dominant right vertebral artery widely patent. 3. 40% atheromatous stenosis at the origin of the right ICA. 4. Otherwise wide patency of the major arterial vasculature of the head and neck. No other hemodynamically significant or correctable stenosis. Overall, appearance is relatively similar as compared to 04/27/2019. Electronically Signed   By: Rise Mu M.D.   On: 06/01/2022 23:11   DG Chest Portable 1 View  Result Date: 06/01/2022 CLINICAL DATA:  Weakness.  Dizziness. EXAM: PORTABLE CHEST 1 VIEW COMPARISON:  09/14/2017 FINDINGS: Right-sided pacemaker with lead tips projecting over the right atrium and ventricle. Prior median sternotomy, stable mild cardiomegaly. Unchanged mediastinal contours. No pulmonary edema, pleural effusion, pneumothorax or focal airspace disease. Surgical clips project over  the right thoracic inlet. On limited assessment, no acute osseous abnormalities are seen. IMPRESSION: Stable mild cardiomegaly. No acute chest findings. Electronically Signed   By: Narda Rutherford M.D.   On: 06/01/2022 21:02     Subjective: Pt reports that he is feeling much better today.   He is tolerating diet well.  No complaints.   Discharge Exam: Vitals:   06/02/22 2140 06/03/22 0634  BP: 107/60 121/70  Pulse: 60 60  Resp: 18 19  Temp: (!) 97.5 F (36.4 C) (!) 97.5 F (36.4 C)  SpO2: 97% 100%   Vitals:   06/02/22 0436 06/02/22 1413 06/02/22 2140 06/03/22 0634  BP: (!) 114/57 109/62 107/60 121/70  Pulse: (!) 59 60 60 60  Resp: 18 18 18 19   Temp: 98.7 F (37.1 C) 97.9 F (36.6 C) (!) 97.5 F (36.4 C) (!) 97.5 F (36.4 C)  TempSrc:  Oral Oral Oral  SpO2: 96% 95% 97% 100%  Weight:      Height:       General: Pt is alert, awake, not in acute distress Cardiovascular: RRR, S1/S2 +, no rubs, no gallops Respiratory: CTA bilaterally, no wheezing, no rhonchi Abdominal: Soft, NT, ND, bowel sounds + Extremities: no edema, no cyanosis   The results of  significant diagnostics from this hospitalization (including imaging, microbiology, ancillary and laboratory) are listed below for reference.     Microbiology: No results found for this or any previous visit (from the past 240 hour(s)).   Labs: BNP (last 3 results) No results for input(s): "BNP" in the last 8760 hours. Basic Metabolic Panel: Recent Labs  Lab 06/01/22 2020 06/01/22 2224 06/02/22 0442 06/03/22 0357  NA 138  --  139 142  K 2.7*  --  3.0* 3.2*  CL 98  --  101 110  CO2 31  --  30 28  GLUCOSE 90  --  117* 112*  BUN 46*  --  43* 27*  CREATININE 1.78*  --  1.51* 0.92  CALCIUM 8.2*  --  8.3* 7.4*  MG  --  2.0 2.1 2.0   Liver Function Tests: Recent Labs  Lab 06/01/22 2020 06/02/22 0442  AST 15 15  ALT 13 10  ALKPHOS 81 78  BILITOT 0.9 0.8  PROT 5.9* 5.6*  ALBUMIN 3.4* 3.1*   No results for input(s): "LIPASE", "AMYLASE" in the last 168 hours. No results for input(s): "AMMONIA" in the last 168 hours. CBC: Recent Labs  Lab 06/01/22 2020 06/02/22 0442  WBC 6.7 5.4  NEUTROABS  --  3.1  HGB 10.2*  10.4* 9.8*  HCT 30.8*  31.5* 29.7*  MCV 92.6 92.5  PLT 160 133*   Cardiac Enzymes: No results for input(s): "CKTOTAL", "CKMB", "CKMBINDEX", "TROPONINI" in the last 168 hours. BNP: Invalid input(s): "POCBNP" CBG: Recent Labs  Lab 06/02/22 0732 06/02/22 1104 06/02/22 1647 06/02/22 2226 06/03/22 0801  GLUCAP 80 129* 192* 147* 133*   D-Dimer No results for input(s): "DDIMER" in the last 72 hours. Hgb A1c Recent Labs    06/02/22 0442  HGBA1C 7.5*   Lipid Profile No results for input(s): "CHOL", "HDL", "LDLCALC", "TRIG", "CHOLHDL", "LDLDIRECT" in the last 72 hours. Thyroid function studies Recent Labs    06/02/22 0442  TSH 3.290   Anemia work up No results for input(s): "VITAMINB12", "FOLATE", "FERRITIN", "TIBC", "IRON", "RETICCTPCT" in the last 72 hours. Urinalysis    Component Value Date/Time   COLORURINE STRAW (A) 06/01/2022 1942    APPEARANCEUR CLEAR 06/01/2022 1942  LABSPEC 1.010 06/01/2022 1942   PHURINE 6.0 06/01/2022 1942   GLUCOSEU NEGATIVE 06/01/2022 1942   HGBUR NEGATIVE 06/01/2022 1942   BILIRUBINUR NEGATIVE 06/01/2022 1942   KETONESUR NEGATIVE 06/01/2022 1942   PROTEINUR NEGATIVE 06/01/2022 1942   UROBILINOGEN 0.2 08/09/2013 1012   NITRITE NEGATIVE 06/01/2022 1942   LEUKOCYTESUR NEGATIVE 06/01/2022 1942   Sepsis Labs Recent Labs  Lab 06/01/22 2020 06/02/22 0442  WBC 6.7 5.4   Microbiology No results found for this or any previous visit (from the past 240 hour(s)).  Time coordinating discharge: 32 mins  SIGNED:  Standley Dakins, MD  [facility] 06/03/2022, 9:17 AM How to contact the [facility] Attending or Consulting provider 7A - 7P or covering provider during after hours 7P -7A, for this patient?  Check the care team in [facility] and look for a) attending/consulting [facility] provider listed and b) the [facility] team listed Log into [email or web address] and use [facility] [place]'s universal password to access. If you do not have the password, please contact the hospital operator. Locate the [facility] provider you are looking for under [facility] and page to a number that you can be directly reached. If you still have difficulty reaching the provider, please page the [facility] (Director on Call) for the Hospitalists listed on amion for assistance.

## 2022-06-03 NOTE — Care Management Obs Status (Signed)
MEDICARE OBSERVATION STATUS NOTIFICATION   Patient Details  Name: [name] MRN: [id number] Date of Birth: October 29, 1945   Medicare Observation Status Notification Given:  Yes (spoke with by telephone, copy mailed to address on file)    Corey Harold 06/03/2022, 9:21 AM

## 2022-06-03 NOTE — Discharge Instructions (Signed)
IMPORTANT INFORMATION: PAY CLOSE ATTENTION   PHYSICIAN DISCHARGE INSTRUCTIONS  Follow with Primary care provider  Nicholson, Sterling J IV, FNP  and other consultants as instructed by your Hospitalist Physician  SEEK MEDICAL CARE OR RETURN TO EMERGENCY ROOM IF SYMPTOMS COME BACK, WORSEN OR NEW PROBLEM DEVELOPS   Please note: You were cared for by a hospitalist during your hospital stay. Every effort will be made to forward records to your primary care provider.  You can request that your primary care provider send for your hospital records if they have not received them.  Once you are discharged, your primary care physician will handle any further medical issues. Please note that NO REFILLS for any discharge medications will be authorized once you are discharged, as it is imperative that you return to your primary care physician (or establish a relationship with a primary care physician if you do not have one) for your post hospital discharge needs so that they can reassess your need for medications and monitor your lab values.  Please get a complete blood count and chemistry panel checked by your Primary MD at your next visit, and again as instructed by your Primary MD.  Get Medicines reviewed and adjusted: Please take all your medications with you for your next visit with your Primary MD  Laboratory/radiological data: Please request your Primary MD to go over all hospital tests and procedure/radiological results at the follow up, please ask your primary care provider to get all Hospital records sent to his/her office.  In some cases, they will be blood work, cultures and biopsy results pending at the time of your discharge. Please request that your primary care provider follow up on these results.  If you are diabetic, please bring your blood sugar readings with you to your follow up appointment with primary care.    Please call and make your follow up appointments as soon as possible.     Also Note the following: If you experience worsening of your admission symptoms, develop shortness of breath, life threatening emergency, suicidal or homicidal thoughts you must seek medical attention immediately by calling 911 or calling your MD immediately  if symptoms less severe.  You must read complete instructions/literature along with all the possible adverse reactions/side effects for all the Medicines you take and that have been prescribed to you. Take any new Medicines after you have completely understood and accpet all the possible adverse reactions/side effects.   Do not drive when taking Pain medications or sleeping medications (Benzodiazepines)  Do not take more than prescribed Pain, Sleep and Anxiety Medications. It is not advisable to combine anxiety,sleep and pain medications without talking with your primary care practitioner  Special Instructions: If you have smoked or chewed Tobacco  in the last 2 yrs please stop smoking, stop any regular Alcohol  and or any Recreational drug use.  Wear Seat belts while driving.  Do not drive if taking any narcotic, mind altering or controlled substances or recreational drugs or alcohol.       

## 2022-06-06 ENCOUNTER — Inpatient Hospital Stay: Payer: Medicare HMO

## 2022-06-06 ENCOUNTER — Other Ambulatory Visit: Payer: Self-pay

## 2022-06-06 ENCOUNTER — Inpatient Hospital Stay: Payer: Medicare HMO | Attending: Oncology

## 2022-06-06 DIAGNOSIS — D509 Iron deficiency anemia, unspecified: Secondary | ICD-10-CM

## 2022-06-06 DIAGNOSIS — E538 Deficiency of other specified B group vitamins: Secondary | ICD-10-CM | POA: Insufficient documentation

## 2022-06-06 MED ORDER — CYANOCOBALAMIN 1000 MCG/ML IJ SOLN
1000.0000 ug | Freq: Once | INTRAMUSCULAR | Status: AC
  Administered 2022-06-06: 1000 ug via INTRAMUSCULAR
  Filled 2022-06-06: qty 1

## 2022-06-18 ENCOUNTER — Ambulatory Visit: Payer: Medicare HMO | Admitting: Podiatry

## 2022-06-26 ENCOUNTER — Emergency Department (HOSPITAL_COMMUNITY)
Admission: EM | Admit: 2022-06-26 | Discharge: 2022-06-27 | Disposition: A | Discharge status: Home / Self Care | Payer: Medicare HMO | Attending: Emergency Medicine | Admitting: Emergency Medicine

## 2022-06-26 ENCOUNTER — Encounter (HOSPITAL_COMMUNITY): Payer: Self-pay | Admitting: Emergency Medicine

## 2022-06-26 ENCOUNTER — Other Ambulatory Visit: Payer: Self-pay

## 2022-06-26 ENCOUNTER — Emergency Department (HOSPITAL_COMMUNITY): Payer: Medicare HMO

## 2022-06-26 DIAGNOSIS — Z95810 Presence of automatic (implantable) cardiac defibrillator: Secondary | ICD-10-CM | POA: Insufficient documentation

## 2022-06-26 DIAGNOSIS — M7989 Other specified soft tissue disorders: Secondary | ICD-10-CM | POA: Insufficient documentation

## 2022-06-26 DIAGNOSIS — I509 Heart failure, unspecified: Secondary | ICD-10-CM | POA: Insufficient documentation

## 2022-06-26 DIAGNOSIS — E119 Type 2 diabetes mellitus without complications: Secondary | ICD-10-CM | POA: Insufficient documentation

## 2022-06-26 DIAGNOSIS — Z7901 Long term (current) use of anticoagulants: Secondary | ICD-10-CM | POA: Insufficient documentation

## 2022-06-26 DIAGNOSIS — R531 Weakness: Secondary | ICD-10-CM | POA: Insufficient documentation

## 2022-06-26 DIAGNOSIS — Z7984 Long term (current) use of oral hypoglycemic drugs: Secondary | ICD-10-CM | POA: Insufficient documentation

## 2022-06-26 DIAGNOSIS — R600 Localized edema: Secondary | ICD-10-CM | POA: Diagnosis not present

## 2022-06-26 LAB — BASIC METABOLIC PANEL
Anion gap: 12 (ref 5–15)
BUN: 33 mg/dL — ABNORMAL HIGH (ref 8–23)
CO2: 31 mmol/L (ref 22–32)
Calcium: 9 mg/dL (ref 8.9–10.3)
Chloride: 97 mmol/L — ABNORMAL LOW (ref 98–111)
Creatinine, Ser: 1.45 mg/dL — ABNORMAL HIGH (ref 0.61–1.24)
GFR, Estimated: 51 mL/min — ABNORMAL LOW (ref >60)
Glucose, Bld: 144 mg/dL — ABNORMAL HIGH (ref 70–99)
Potassium: 3.6 mmol/L (ref 3.5–5.1)
Sodium: 140 mmol/L (ref 135–145)

## 2022-06-26 LAB — CBC
HCT: 35.3 % — ABNORMAL LOW (ref 39.0–52.0)
Hemoglobin: 11.1 g/dL — ABNORMAL LOW (ref 13.0–17.0)
MCH: 28 pg (ref 26.0–34.0)
MCHC: 31.4 g/dL (ref 30.0–36.0)
MCV: 89.1 fL (ref 80.0–100.0)
Platelets: 223 10*3/uL (ref 150–400)
RBC: 3.96 MIL/uL — ABNORMAL LOW (ref 4.22–5.81)
RDW: 14.6 % (ref 11.5–15.5)
WBC: 8.1 10*3/uL (ref 4.0–10.5)
nRBC: 0 % (ref 0.0–0.2)

## 2022-06-26 NOTE — ED Triage Notes (Signed)
Pt c/o bilateral swelling to legs and ankles that are warm and red, pt reports swelling to right face, hands, arms; dizziness, fatigue and SOB x 2 days

## 2022-06-27 LAB — TROPONIN I (HIGH SENSITIVITY): Troponin I (High Sensitivity): 9 ng/L (ref <18)

## 2022-06-27 LAB — BRAIN NATRIURETIC PEPTIDE: B Natriuretic Peptide: 497 pg/mL — ABNORMAL HIGH (ref 0.0–100.0)

## 2022-06-27 MED ORDER — RIVAROXABAN 20 MG PO TABS
20.0000 mg | ORAL_TABLET | Freq: Every day | ORAL | Status: DC
  Administered 2022-06-27: 20 mg via ORAL
  Filled 2022-06-27: qty 1

## 2022-06-27 MED ORDER — CARVEDILOL 12.5 MG PO TABS: 25.0000 mg | ORAL_TABLET | Freq: Two times a day (BID) | ORAL | Status: DC

## 2022-06-27 MED ORDER — CYCLOBENZAPRINE HCL 10 MG PO TABS
10.0000 mg | ORAL_TABLET | Freq: Two times a day (BID) | ORAL | Status: DC | PRN
  Administered 2022-06-27: 10 mg via ORAL
  Filled 2022-06-27: qty 1

## 2022-06-27 MED ORDER — GLIMEPIRIDE 4 MG PO TABS: 4.0000 mg | ORAL_TABLET | Freq: Every day | ORAL | Status: DC

## 2022-06-27 MED ORDER — RIVAROXABAN 20 MG PO TABS: 20.0000 mg | ORAL_TABLET | Freq: Every day | ORAL | Status: DC

## 2022-06-27 MED ORDER — OXYCODONE-ACETAMINOPHEN 5-325 MG PO TABS
1.0000 | ORAL_TABLET | Freq: Once | ORAL | Status: AC
  Administered 2022-06-27: 1 via ORAL
  Filled 2022-06-27: qty 1

## 2022-06-27 MED ORDER — FUROSEMIDE 10 MG/ML IJ SOLN
40.0000 mg | Freq: Once | INTRAMUSCULAR | Status: AC
  Administered 2022-06-27: 40 mg via INTRAVENOUS
  Filled 2022-06-27: qty 4

## 2022-06-27 MED ORDER — GLIMEPIRIDE 4 MG PO TABS
4.0000 mg | ORAL_TABLET | Freq: Every day | ORAL | Status: DC
  Filled 2022-06-27: qty 1

## 2022-06-27 MED ORDER — BACLOFEN 10 MG PO TABS
40.0000 mg | ORAL_TABLET | Freq: Every day | ORAL | Status: DC
  Administered 2022-06-27: 40 mg via ORAL
  Filled 2022-06-27: qty 4

## 2022-06-27 NOTE — Discharge Instructions (Signed)
Resume taking your Lasix as previously recommended.  Follow-up with your primary doctor in the next few days, and return to the ER if you develop severe chest pain, worsening breathing, worsening swelling, high fever, or for other new and concerning symptoms.

## 2022-06-27 NOTE — ED Provider Notes (Signed)
[facility] EMERGENCY DEPARTMENT Provider Note   CSN: [id number] Arrival date & time: 06/26/22  2007     History  Chief Complaint  Patient presents with   Leg Swelling    [name] is a 76 y.o. male.  Patient is a 76 year old male with past medical history of nonischemic cardiomyopathy with AICD device, paroxysmal A-fib, diabetes, prior CVA with right-sided hemiparesis, congestive heart failure.  Patient presenting today with complaints of generalized weakness and swelling of his legs and right arm.  This has been worsening over the past few days.  Patient describes a 10 pound weight gain, but has not been taking his Lasix as he does not like the way this medicine makes him feel.  He denies chest pain, fevers, chills, or cough.  He denies nausea or, vomiting, or diarrhea.  The history is provided by the patient.       Home Medications Prior to Admission medications   Medication Sig Start Date End Date Taking? Authorizing Provider  acetaminophen (TYLENOL) 500 MG tablet Take 1 tablet (500 mg total) by mouth every 6 (six) hours as needed. 12/22/21   Horton, Barbette Hair, MD  atorvastatin (LIPITOR) 40 MG tablet Take 40 mg by mouth daily. 07/16/20   [provider]  baclofen (LIORESAL) 20 MG tablet Take 40 mg by mouth at bedtime.     [provider]  carvedilol (COREG) 25 MG tablet Take 1 tablet (25 mg total) by mouth 2 (two) times daily with a meal. 09/12/20   Evans Lance, MD  cyclobenzaprine (FLEXERIL) 10 MG tablet Take 1 tablet (10 mg total) by mouth 2 (two) times daily as needed for muscle spasms. 12/22/21   Horton, Barbette Hair, MD  DULoxetine (CYMBALTA) 30 MG capsule Take 30 mg by mouth daily.    [provider]  furosemide (LASIX) 20 MG tablet Take 1 tablet (20 mg total) by mouth 2 (two) times daily. 06/06/22   Johnson, Clanford L, MD  glimepiride (AMARYL) 4 MG tablet Take 1 tablet (4 mg total) by mouth daily with breakfast. 06/05/22   Johnson, Clanford  L, MD  linagliptin (TRADJENTA) 5 MG TABS tablet Take 5 mg by mouth daily after breakfast.     [provider]  meclizine (ANTIVERT) 25 MG tablet Take 25 mg by mouth 4 (four) times daily as needed. For dizziness 02/26/12   [provider]  metFORMIN (GLUCOPHAGE) 500 MG tablet Take 500 mg by mouth daily with breakfast. 08/26/20   [provider]  oxyCODONE-acetaminophen (PERCOCET/ROXICET) 5-325 MG tablet Take a half tablet every 6 hours 04/18/19   [provider]  rivaroxaban (XARELTO) 20 MG TABS tablet TAKE 1 TABLET BY MOUTH ONCE DAILY WITH SUPPER 12/30/21   Evans Lance, MD  SOLIQUA 100-33 UNT-MCG/ML SOPN Inject 45 Units into the skin daily. 07/17/20   [provider]  tamsulosin (FLOMAX) 0.4 MG CAPS capsule Take 0.4 mg by mouth daily.    [provider]  triamcinolone cream (KENALOG) 0.5 % triamcinolone acetonide 0.5 % topical cream  APPLY CREAM TOPICALLY TO AFFECTED AREA TWICE DAILY    [provider]      Allergies    Codeine    Review of Systems   Review of Systems  All other systems reviewed and are negative.   Physical Exam Updated Vital Signs BP 120/66   Pulse (!) 59   Temp (!) 97.5 F (36.4 C) (Oral)   Resp 18   Ht 6\' 2"  (1.88 m)  Wt 99.8 kg   SpO2 97%   BMI 28.25 kg/m  Physical Exam Vitals and nursing note reviewed.  Constitutional:      General: He is not in acute distress.    Appearance: He is well-developed. He is not diaphoretic.  HENT:     Head: Normocephalic and atraumatic.  Cardiovascular:     Rate and Rhythm: Normal rate and regular rhythm.     Heart sounds: No murmur heard.    No friction rub.  Pulmonary:     Effort: Pulmonary effort is normal. No respiratory distress.     Breath sounds: Normal breath sounds. No wheezing or rales.  Abdominal:     General: Bowel sounds are normal. There is no distension.     Palpations: Abdomen is soft.     Tenderness: There is no abdominal tenderness.   Musculoskeletal:        General: Normal range of motion.     Cervical back: Normal range of motion and neck supple.     Right lower leg: No edema.     Left lower leg: No edema.     Comments: There are lymphedematous changes of both lower extremities along with 2+ pitting edema.  There is no warmth or redness that would suggest cellulitis.  He also has mild swelling to the right arm, but pulses are easily palpable and motor and sensation intact throughout the entire hand.  Skin:    General: Skin is warm and dry.  Neurological:     Mental Status: He is alert and oriented to person, place, and time.     Coordination: Coordination normal.     ED Results / Procedures / Treatments   Labs (all labs ordered are listed, but only abnormal results are displayed) Labs Reviewed  BASIC METABOLIC PANEL - Abnormal; Notable for the following components:      Result Value   Chloride 97 (*)    Glucose, Bld 144 (*)    BUN 33 (*)    Creatinine, Ser 1.45 (*)    GFR, Estimated 51 (*)    All other components within normal limits  CBC - Abnormal; Notable for the following components:   RBC 3.96 (*)    Hemoglobin 11.1 (*)    HCT 35.3 (*)    All other components within normal limits    EKG EKG Interpretation  Date/Time:  Thursday June 26 2022 21:03:35 EDT Ventricular Rate:  60 PR Interval:    QRS Duration: 194 QT Interval:  540 QTC Calculation: 540 R Axis:   196 Text Interpretation: Ventricular-paced rhythm Abnormal ECG When compared with ECG of 01-Jun-2022 19:42, no significant change is noted Confirmed by Geoffery Lyons ([id number]) on 06/27/2022 2:23:44 AM  Radiology DG Chest 2 View  Result Date: 06/26/2022 CLINICAL DATA:  Shortness of breath EXAM: CHEST - 2 VIEW COMPARISON:  06/01/2022, 09/14/2017 FINDINGS: Post sternotomy changes. Right-sided pacing device as before. Cardiomegaly. No pleural effusion, consolidation, or pneumothorax. Postsurgical changes at the right thoracic inlet.  IMPRESSION: No active cardiopulmonary disease.  Cardiomegaly Electronically Signed   By: Jasmine Pang M.D.   On: 06/26/2022 21:19    Procedures Procedures    Medications Ordered in ED Medications  furosemide (LASIX) injection 40 mg (has no administration in time range)    ED Course/ Medical Decision Making/ A&P  This patient presents to the ED for concern of weakness, this involves an extensive number of treatment options, and is a complaint that carries with it a high risk of  complications and morbidity.  The differential diagnosis includes CHF exacerbation, anemia, dehydration   Co morbidities that complicate the patient evaluation  None   Additional history obtained:  Noted general history or external records needed   Lab Tests:  I Ordered, and personally interpreted labs.  The pertinent results include: Unremarkable CBC, metabolic panel, and troponin.  BNP is elevated above baseline at 497.   Imaging Studies ordered:  I ordered imaging studies including chest x-ray I independently visualized and interpreted imaging which showed no acute process I agree with the radiologist interpretation   Cardiac Monitoring: / EKG:  The patient was maintained on a cardiac monitor.  I personally viewed and interpreted the cardiac monitored which showed an underlying rhythm of: Sinus/paced rhythm   Consultations Obtained:  No urgent consultations needed.   Problem List / ED Course / Critical interventions / Medication management  Patient presenting with complaints of weakness as described in the HPI, I suspect the cause of his weakness is fluid retention.  Patient reports not taking his Lasix as prescribed and 10 pound weight gain.  He does have an elevation of his BNP and increased swelling in his legs.  The legs do not appear cellulitic and I highly doubt bilateral DVT, especially given the fact that he is taking Xarelto.  Remainder of the work-up shows no significant  abnormality that would explain his symptoms and nothing else emergent has appeared.  I feel as though patient can safely go home and follow-up with primary doctor.  He was advised to take his Lasix as prescribed. I ordered medication including IV Lasix for diuresis Reevaluation of the patient after these medicines showed that the patient improved I have reviewed the patients home medicines and have made adjustments as needed   Social Determinants of Health:  None   Test / Admission - Considered:  No emergent condition identified that would necessitate admission.  Final Clinical Impression(s) / ED Diagnoses Final diagnoses:  None    Rx / DC Orders ED Discharge Orders     None         Geoffery Lyons, MD 06/27/22 0225

## 2022-07-01 ENCOUNTER — Encounter: Payer: Self-pay | Admitting: Internal Medicine

## 2022-07-01 ENCOUNTER — Encounter: Payer: Medicare HMO | Admitting: Internal Medicine

## 2022-07-01 ENCOUNTER — Ambulatory Visit (INDEPENDENT_AMBULATORY_CARE_PROVIDER_SITE_OTHER): Payer: Medicare HMO

## 2022-07-01 ENCOUNTER — Ambulatory Visit: Payer: Medicare HMO | Attending: Internal Medicine | Admitting: Internal Medicine

## 2022-07-01 VITALS — BP 124/60 | HR 64 | Ht 74.0 in | Wt 219.0 lb

## 2022-07-01 DIAGNOSIS — I4819 Other persistent atrial fibrillation: Secondary | ICD-10-CM | POA: Diagnosis not present

## 2022-07-01 DIAGNOSIS — I428 Other cardiomyopathies: Secondary | ICD-10-CM | POA: Diagnosis not present

## 2022-07-01 DIAGNOSIS — I5022 Chronic systolic (congestive) heart failure: Secondary | ICD-10-CM

## 2022-07-01 NOTE — Patient Instructions (Signed)
Medication Instructions:  Your physician recommends that you continue on your current medications as directed. Please refer to the Current Medication list given to you today.  *If you need a refill on your cardiac medications before your next appointment, please call your pharmacy*   Lab Work: NONE   If you have labs (blood work) drawn today and your tests are completely normal, you will receive your results only by: MyChart Message (if you have MyChart) OR A paper copy in the mail If you have any lab test that is abnormal or we need to change your treatment, we will call you to review the results.   Testing/Procedures: NONE    Follow-Up: At [facility], you and your health needs are our priority.  As part of our continuing mission to provide you with exceptional heart care, we have created designated Provider Care Teams.  These Care Teams include your primary Cardiologist (physician) and Advanced Practice Providers (APPs -  Physician Assistants and Nurse Practitioners) who all work together to provide you with the care you need, when you need it.  We recommend signing up for the patient portal called "MyChart".  Sign up information is provided on this After Visit Summary.  MyChart is used to connect with patients for Virtual Visits (Telemedicine).  Patients are able to view lab/test results, encounter notes, upcoming appointments, etc.  Non-urgent messages can be sent to your provider as well.   To learn more about what you can do with MyChart, go to [email or web address].    Your next appointment:   1 year(s)  The format for your next appointment:   In Person  Provider:   Gregg Taylor, MD    Other Instructions Thank you for choosing [facility]!    Important Information About Sugar       

## 2022-07-01 NOTE — Progress Notes (Signed)
HPI [name] returns today for followup. He is a pleasant 76 yo man with a h/o chronic systolic heart failure, poorly controlled atrial fib, persistent atrial fib, h/o GI bleeding on Xarelto, and s/p ICD. I saw him 18 months ago. In the interim, he notes he has lost his wife. His Xarelto was restarted. He denies chest pain. He has mild peripheral edema. Allergies  Allergen Reactions   Codeine Nausea And Vomiting     Current Outpatient Medications  Medication Sig Dispense Refill   acetaminophen (TYLENOL) 500 MG tablet Take 1 tablet (500 mg total) by mouth every 6 (six) hours as needed. 30 tablet 0   atorvastatin (LIPITOR) 40 MG tablet Take 40 mg by mouth daily.     baclofen (LIORESAL) 20 MG tablet Take 40 mg by mouth at bedtime.      carvedilol (COREG) 25 MG tablet Take 1 tablet (25 mg total) by mouth 2 (two) times daily with a meal. 180 tablet 3   cyclobenzaprine (FLEXERIL) 10 MG tablet Take 1 tablet (10 mg total) by mouth 2 (two) times daily as needed for muscle spasms. 20 tablet 0   DULoxetine (CYMBALTA) 30 MG capsule Take 30 mg by mouth daily.     furosemide (LASIX) 20 MG tablet Take 1 tablet (20 mg total) by mouth 2 (two) times daily. 30 tablet    glimepiride (AMARYL) 4 MG tablet Take 1 tablet (4 mg total) by mouth daily with breakfast.     linagliptin (TRADJENTA) 5 MG TABS tablet Take 5 mg by mouth daily after breakfast.      meclizine (ANTIVERT) 25 MG tablet Take 25 mg by mouth 4 (four) times daily as needed. For dizziness     metFORMIN (GLUCOPHAGE) 500 MG tablet Take 500 mg by mouth daily with breakfast.     oxyCODONE-acetaminophen (PERCOCET/ROXICET) 5-325 MG tablet Take a half tablet every 6 hours     rivaroxaban (XARELTO) 20 MG TABS tablet TAKE 1 TABLET BY MOUTH ONCE DAILY WITH SUPPER 30 tablet 5   SOLIQUA 100-33 UNT-MCG/ML SOPN Inject 45 Units into the skin daily.     tamsulosin (FLOMAX) 0.4 MG CAPS capsule Take 0.4 mg by mouth daily.     triamcinolone cream (KENALOG)  0.5 % triamcinolone acetonide 0.5 % topical cream  APPLY CREAM TOPICALLY TO AFFECTED AREA TWICE DAILY     No current facility-administered medications for this visit.     Past Medical History:  Diagnosis Date   Anemia    Multifactorial  - hx of B12 def and iron deficiency, followed by heme, followed [facility]   Automatic implantable cardioverter-defibrillator in situ    CAD (coronary artery disease)    a. STEMI s/p CABG 07/2007 (had preop cardiogenic shock, IABP, VDRF before surgery)   Cardiomyopathy, ischemic    a. Chronic systolic CHF s/p St. Jude dual chamber ICD 03/2008.   Carotid stenosis    a. Prior carotid dz/ surgery. b. Carotid dopp 07/2011 - 0-39% bilaterally.;   c. Doppler 11/13: [id number] RICA, [id number] LICA   Chronic renal insufficiency    Diabetes mellitus    GI bleed    Reported history   Hx of CABG    Hypertension    [place] [facility]    Mitral regurgitation    Myocardial infarction [facility])    Neuromuscular disorder (HCC)    R sided weakness    Osteoarthritis    PAF (paroxysmal atrial fibrillation) ([place])    a. Poor  Coumadin candidate due to hx of GI bleed.   Sinus bradycardia    Sleep apnea    study done, 10 yrs ago, unable to tolerate CPAP   Splenomegaly    Stroke [facility])    a. CVA 1990s - chronic pain in RUE after stroke.   Systolic CHF, chronic (HCC)    Previously taken off lisinopril by primary doctor due to labs   Tobacco abuse     ROS:   All systems reviewed and negative except as noted in the HPI.   Past Surgical History:  Procedure Laterality Date   CARDIAC DEFIBRILLATOR PLACEMENT  04/05/2008   Implantation of a St. Jude dual-chamber defibrillator, Champ Mungo. [name] , [place] N/A 02/19/2016   Procedure: LAPAROSCOPIC CHOLECYSTECTOMY;  Surgeon: Coralie Keens, MD;  Location: [place];  Service: General;  Laterality: N/A;   COLONOSCOPY  07/2007   Rourk: left-sided diverticulum. TI normal. No  etiology for IDA.   CORONARY ARTERY BYPASS GRAFT     CORONARY ARTERY BYPASS GRAFT  08/29/2007   x3; Braulio Conte H. Roxy Manns MD   EP IMPLANTABLE DEVICE N/A 10/05/2015   Procedure: ICD Generator Changeout;  Surgeon: Evans Lance, MD;  Location: [place] CV LAB;  Service: Cardiovascular;  Laterality: N/A;   ERCP N/A 02/18/2016   Procedure: ENDOSCOPIC RETROGRADE CHOLANGIOPANCREATOGRAPHY (ERCP);  Surgeon: Clarene Essex, MD;  Location: [facility] ENDOSCOPY;  Service: Endoscopy;  Laterality: N/A;   ESOPHAGOGASTRODUODENOSCOPY  07/2007   Rourk: small hh, atrophic gastric mucosa, poor distensibility of stomach ?extrinsic compression, CT showed mild to moderate splenomegaly. No etiology for IDA.   JOINT REPLACEMENT  1990's   L knee   LAPAROSCOPIC APPENDECTOMY N/A 09/14/2017   Procedure: APPENDECTOMY LAPAROSCOPIC;  Surgeon: Judeth Horn, MD;  Location: [place];  Service: General;  Laterality: N/A;   Percutaneous coronary intervention  01/10/2005   Eustace Quail, MD   TONSILLECTOMY     TOTAL KNEE ARTHROPLASTY Right 08/16/2013   Procedure: RIGHT TOTAL KNEE ARTHROPLASTY;  Surgeon: Mcarthur Rossetti, MD;  Location: [place];  Service: Orthopedics;  Laterality: Right;     Family History  Problem Relation Age of Onset   Heart attack Mother        CVA, MI   Diabetes Mother    Heart attack Father        MI   Diabetes Sister    Coronary artery disease Brother        CABG     Social History   Socioeconomic History   Marital status: Unknown    Spouse name: Not on file   Number of children: Not on file   Years of education: Not on file   Highest education level: Not on file  Occupational History   Occupation: Retired    Fish farm manager: UNEMPLOYED  Tobacco Use   Smoking status: Former    Packs/day: 1.00    Years: 15.00    Total pack years: 15.00    Types: Cigarettes    Quit date: 09/29/1990    Years since quitting: 31.7   Smokeless tobacco: Former    Quit date: 08/09/1988  Vaping Use   Vaping Use: Never used   Substance and Sexual Activity   Alcohol use: No   Drug use: No   Sexual activity: Not Currently  Other Topics Concern   Not on file  Social History Narrative   ** Merged History Encounter **       Lives in [place] with  wife   Been on disability since his stroke in the 1990s   Not routinely exercising   Social Determinants of Health   Financial Resource Strain: Not on file  Food Insecurity: Not on file  Transportation Needs: Not on file  Physical Activity: Not on file  Stress: Not on file  Social Connections: Not on file  Intimate Partner Violence: Not on file     BP 124/60   Pulse 64   Ht 6\' 2"  (1.88 m)   Wt 219 lb (99.3 kg)   SpO2 98%   BMI 28.12 kg/m   Physical Exam:  Well appearing NAD HEENT: Unremarkable Neck:  No JVD, no thyromegally Lymphatics:  No adenopathy Back:  No CVA tenderness Lungs:  Clear HEART:  Regular rate rhythm, no murmurs, no rubs, no clicks Abd:  soft, positive bowel sounds, no organomegally, no rebound, no guarding Ext:  2 plus pulses, no edema, no cyanosis, no clubbing Skin:  No rashes no nodules Neuro:  CN II through XII intact, motor grossly intact  DEVICE  Normal device function.  See PaceArt for details.   Assess/Plan:  PAF - he is back in NSR. We reprogrammed his ICD to allow for atrial pacing and intrinsic conduction. Chronic systolic heart failure - his symptoms have been class 3. Hopefully now that we have reprogrammed from VVI pacing to DDD allowing for intrinsic conduction he will feel better. ICD - as above. His device is working normally. Coags - he will continue xarelto.  Carleene Overlie Austin Pongratz,MD

## 2022-07-02 ENCOUNTER — Telehealth: Payer: Self-pay | Admitting: Oncology

## 2022-07-02 LAB — CUP PACEART REMOTE DEVICE CHECK
Battery Remaining Longevity: 21 mo
Battery Remaining Percentage: 24 %
Battery Voltage: 2.8 V
Brady Statistic AP VP Percent: 1.4 %
Brady Statistic AP VS Percent: 91 %
Brady Statistic AS VP Percent: 1 %
Brady Statistic AS VS Percent: 6.4 %
Brady Statistic RA Percent Paced: 92 %
Brady Statistic RV Percent Paced: 2 %
Date Time Interrogation Session: 20231004115630
HighPow Impedance: 57 Ohm
HighPow Impedance: 57 Ohm
Implantable Lead Implant Date: 20090708
Implantable Lead Implant Date: 20090708
Implantable Lead Location: 753859
Implantable Lead Location: 753860
Implantable Lead Model: 7122
Implantable Pulse Generator Implant Date: 20170106
Lead Channel Impedance Value: 300 Ohm
Lead Channel Impedance Value: 330 Ohm
Lead Channel Pacing Threshold Amplitude: 0.5 V
Lead Channel Pacing Threshold Amplitude: 1 V
Lead Channel Pacing Threshold Pulse Width: 0.5 ms
Lead Channel Pacing Threshold Pulse Width: 0.9 ms
Lead Channel Sensing Intrinsic Amplitude: 1.3 mV
Lead Channel Sensing Intrinsic Amplitude: 7.3 mV
Lead Channel Setting Pacing Amplitude: 2 V
Lead Channel Setting Pacing Amplitude: 2 V
Lead Channel Setting Pacing Pulse Width: 0.9 ms
Lead Channel Setting Sensing Sensitivity: 0.5 mV
Pulse Gen Serial Number: 7306179

## 2022-07-02 NOTE — Telephone Encounter (Signed)
Per 10/4 phone line pt called to r/s appointment

## 2022-07-03 ENCOUNTER — Inpatient Hospital Stay: Payer: Medicare HMO | Attending: Oncology

## 2022-07-03 DIAGNOSIS — D509 Iron deficiency anemia, unspecified: Secondary | ICD-10-CM

## 2022-07-03 DIAGNOSIS — E538 Deficiency of other specified B group vitamins: Secondary | ICD-10-CM | POA: Diagnosis present

## 2022-07-03 MED ORDER — CYANOCOBALAMIN 1000 MCG/ML IJ SOLN
1000.0000 ug | Freq: Once | INTRAMUSCULAR | Status: AC
  Administered 2022-07-03: 1000 ug via INTRAMUSCULAR
  Filled 2022-07-03: qty 1

## 2022-07-04 ENCOUNTER — Inpatient Hospital Stay: Payer: Medicare HMO

## 2022-07-06 ENCOUNTER — Other Ambulatory Visit: Payer: Self-pay | Admitting: Internal Medicine

## 2022-07-06 DIAGNOSIS — I480 Paroxysmal atrial fibrillation: Secondary | ICD-10-CM

## 2022-07-07 NOTE — Telephone Encounter (Signed)
Prescription refill request for Xarelto received.  Indication: Afib  Last office visit: 07/01/22 [name])  Weight: 99.3kg Age: 76 Scr: 1.45 (06/26/22)  CrCl: 62.66ml/min  Appropriate dose and refill sent to requested pharmacy.

## 2022-07-11 NOTE — Progress Notes (Signed)
Remote ICD transmission.   

## 2022-07-25 ENCOUNTER — Encounter: Payer: Self-pay | Admitting: Podiatry

## 2022-07-25 ENCOUNTER — Ambulatory Visit (INDEPENDENT_AMBULATORY_CARE_PROVIDER_SITE_OTHER): Payer: Medicare HMO | Admitting: Podiatry

## 2022-07-25 DIAGNOSIS — E1151 Type 2 diabetes mellitus with diabetic peripheral angiopathy without gangrene: Secondary | ICD-10-CM | POA: Diagnosis not present

## 2022-07-25 DIAGNOSIS — B351 Tinea unguium: Secondary | ICD-10-CM

## 2022-07-25 DIAGNOSIS — D689 Coagulation defect, unspecified: Secondary | ICD-10-CM

## 2022-07-25 DIAGNOSIS — E1169 Type 2 diabetes mellitus with other specified complication: Secondary | ICD-10-CM

## 2022-07-25 DIAGNOSIS — D696 Thrombocytopenia, unspecified: Secondary | ICD-10-CM

## 2022-07-25 DIAGNOSIS — N183 Chronic kidney disease, stage 3 unspecified: Secondary | ICD-10-CM

## 2022-07-25 NOTE — Progress Notes (Signed)
This patient returns to my office for at risk foot care.  This patient requires this care by a professional since this patient will be at risk due to having diabetes PVD and thrombocytopenia.  patient is unable to cut nails himself since the patient cannot reach his nails.These nails are painful walking and wearing shoes.  This patient presents for at risk foot care today.  General Appearance  Alert, conversant and in no acute stress.  Vascular  Dorsalis pedis and posterior tibial  pulses are  weakly palpable  bilaterally.  Capillary return is within normal limits  bilaterally. Cold feet. bilaterally.  Neurologic  Senn-Weinstein monofilament wire test within normal limits  bilaterally. Muscle power within normal limits bilaterally.  Nails Thick disfigured discolored nails with subungual debris  from hallux to fifth toes bilaterally. No evidence of bacterial infection or drainage bilaterally.  Orthopedic  No limitations of motion  feet .  No crepitus or effusions noted.  No bony pathology or digital deformities noted.  Skin  normotropic skin with no porokeratosis noted bilaterally.  No signs of infections or ulcers noted.     Onychomycosis  Pain in right toes  Pain in left toes  Consent was obtained for treatment procedures.   Mechanical debridement of nails 1-5  bilaterally performed with a nail nipper.  Filed with dremel without incident.    Return office visit   3 months                   Told patient to return for periodic foot care and evaluation due to potential at risk complications.   Meryem Haertel DPM   

## 2022-07-31 ENCOUNTER — Encounter: Payer: Medicare HMO | Admitting: Internal Medicine

## 2022-08-04 ENCOUNTER — Inpatient Hospital Stay: Payer: Medicare HMO | Attending: Oncology

## 2022-08-04 ENCOUNTER — Other Ambulatory Visit: Payer: Self-pay

## 2022-08-04 DIAGNOSIS — E538 Deficiency of other specified B group vitamins: Secondary | ICD-10-CM | POA: Insufficient documentation

## 2022-08-04 DIAGNOSIS — D509 Iron deficiency anemia, unspecified: Secondary | ICD-10-CM

## 2022-08-04 MED ORDER — CYANOCOBALAMIN 1000 MCG/ML IJ SOLN
1000.0000 ug | Freq: Once | INTRAMUSCULAR | Status: AC
  Administered 2022-08-04: 1000 ug via INTRAMUSCULAR
  Filled 2022-08-04: qty 1

## 2022-09-01 ENCOUNTER — Other Ambulatory Visit: Payer: Self-pay

## 2022-09-01 ENCOUNTER — Inpatient Hospital Stay: Payer: Medicare HMO | Attending: Oncology

## 2022-09-01 DIAGNOSIS — E538 Deficiency of other specified B group vitamins: Secondary | ICD-10-CM | POA: Insufficient documentation

## 2022-09-01 DIAGNOSIS — D509 Iron deficiency anemia, unspecified: Secondary | ICD-10-CM

## 2022-09-01 MED ORDER — CYANOCOBALAMIN 1000 MCG/ML IJ SOLN
1000.0000 ug | Freq: Once | INTRAMUSCULAR | Status: AC
  Administered 2022-09-01: 1000 ug via INTRAMUSCULAR
  Filled 2022-09-01: qty 1

## 2022-09-30 ENCOUNTER — Ambulatory Visit (INDEPENDENT_AMBULATORY_CARE_PROVIDER_SITE_OTHER): Payer: Medicare HMO

## 2022-09-30 DIAGNOSIS — I5022 Chronic systolic (congestive) heart failure: Secondary | ICD-10-CM

## 2022-09-30 DIAGNOSIS — I428 Other cardiomyopathies: Secondary | ICD-10-CM

## 2022-09-30 LAB — CUP PACEART REMOTE DEVICE CHECK
Battery Remaining Longevity: 17 mo
Battery Remaining Percentage: 21 %
Battery Voltage: 2.78 V
Brady Statistic AP VP Percent: 8.2 %
Brady Statistic AP VS Percent: 66 %
Brady Statistic AS VP Percent: 7.9 %
Brady Statistic AS VS Percent: 16 %
Brady Statistic RA Percent Paced: 70 %
Brady Statistic RV Percent Paced: 16 %
Date Time Interrogation Session: 20240102020034
HighPow Impedance: 66 Ohm
HighPow Impedance: 66 Ohm
Implantable Lead Connection Status: 753985
Implantable Lead Connection Status: 753985
Implantable Lead Implant Date: 20090708
Implantable Lead Implant Date: 20090708
Implantable Lead Location: 753859
Implantable Lead Location: 753860
Implantable Lead Model: 7122
Implantable Pulse Generator Implant Date: 20170106
Lead Channel Impedance Value: 330 Ohm
Lead Channel Impedance Value: 350 Ohm
Lead Channel Pacing Threshold Amplitude: 0.5 V
Lead Channel Pacing Threshold Amplitude: 1 V
Lead Channel Pacing Threshold Pulse Width: 0.5 ms
Lead Channel Pacing Threshold Pulse Width: 0.9 ms
Lead Channel Sensing Intrinsic Amplitude: 1.2 mV
Lead Channel Sensing Intrinsic Amplitude: 8.7 mV
Lead Channel Setting Pacing Amplitude: 2 V
Lead Channel Setting Pacing Amplitude: 2 V
Lead Channel Setting Pacing Pulse Width: 0.9 ms
Lead Channel Setting Sensing Sensitivity: 0.5 mV
Pulse Gen Serial Number: 7306179

## 2022-10-02 ENCOUNTER — Other Ambulatory Visit: Payer: Self-pay

## 2022-10-02 ENCOUNTER — Inpatient Hospital Stay: Payer: Medicare HMO | Attending: Oncology

## 2022-10-02 DIAGNOSIS — D509 Iron deficiency anemia, unspecified: Secondary | ICD-10-CM

## 2022-10-02 DIAGNOSIS — E538 Deficiency of other specified B group vitamins: Secondary | ICD-10-CM | POA: Diagnosis present

## 2022-10-02 MED ORDER — CYANOCOBALAMIN 1000 MCG/ML IJ SOLN
1000.0000 ug | Freq: Once | INTRAMUSCULAR | Status: AC
  Administered 2022-10-02: 1000 ug via INTRAMUSCULAR
  Filled 2022-10-02: qty 1

## 2022-10-03 ENCOUNTER — Ambulatory Visit: Payer: Medicare HMO

## 2022-10-09 ENCOUNTER — Telehealth: Payer: Self-pay | Admitting: Oncology

## 2022-10-09 NOTE — Telephone Encounter (Signed)
Called patient regarding providers departure and rescheduled February appointment with a new provider.  

## 2022-10-30 NOTE — Progress Notes (Signed)
Remote ICD transmission.   

## 2022-10-31 ENCOUNTER — Inpatient Hospital Stay: Payer: Medicare HMO | Attending: Oncology | Admitting: Hematology

## 2022-10-31 ENCOUNTER — Inpatient Hospital Stay: Payer: Medicare HMO

## 2022-10-31 ENCOUNTER — Ambulatory Visit: Payer: Medicare HMO | Admitting: Podiatry

## 2022-11-03 ENCOUNTER — Ambulatory Visit: Payer: Medicare HMO | Admitting: Podiatry

## 2022-11-03 ENCOUNTER — Telehealth: Payer: Self-pay | Admitting: Hematology

## 2022-11-03 NOTE — Telephone Encounter (Signed)
Per 2/5 IB, called but VM is full, will send calendar

## 2022-11-07 ENCOUNTER — Ambulatory Visit: Payer: Medicare HMO | Admitting: Oncology

## 2022-11-07 ENCOUNTER — Other Ambulatory Visit: Payer: Medicare HMO

## 2022-11-07 ENCOUNTER — Ambulatory Visit: Payer: Medicare HMO

## 2022-11-24 ENCOUNTER — Inpatient Hospital Stay: Payer: Medicare HMO

## 2022-11-24 ENCOUNTER — Other Ambulatory Visit: Payer: Self-pay

## 2022-11-24 ENCOUNTER — Inpatient Hospital Stay: Payer: Medicare HMO | Admitting: Hematology

## 2022-11-24 DIAGNOSIS — I480 Paroxysmal atrial fibrillation: Secondary | ICD-10-CM

## 2022-11-24 MED ORDER — RIVAROXABAN 20 MG PO TABS: 20.0000 mg | ORAL_TABLET | Freq: Every day | ORAL | 1 refills | Status: DC

## 2022-11-24 NOTE — Telephone Encounter (Signed)
Prescription refill request for Xarelto received.  Indication: Afib  Last office visit: 07/01/22 [name])  Weight: 99.3kg Age: 77 Scr: 1.45 (06/26/22)  CrCl: 62.34m/min  Appropriate dose. Refill sent.

## 2022-11-25 ENCOUNTER — Telehealth: Payer: Self-pay | Admitting: Hematology

## 2022-11-25 NOTE — Telephone Encounter (Signed)
Per 2/23 IB reached out to reschedule patients missed appointments, patient aware of date and time of appointment.

## 2022-11-26 ENCOUNTER — Telehealth: Payer: Self-pay | Admitting: Hematology

## 2022-11-26 NOTE — Telephone Encounter (Signed)
Patient called to get his injection earlier as this is the only time he could come in.

## 2022-12-01 ENCOUNTER — Ambulatory Visit: Payer: Medicare HMO | Admitting: Podiatry

## 2022-12-05 ENCOUNTER — Encounter: Payer: Self-pay | Admitting: Podiatry

## 2022-12-05 ENCOUNTER — Ambulatory Visit: Payer: Medicare HMO

## 2022-12-05 ENCOUNTER — Other Ambulatory Visit: Payer: Self-pay

## 2022-12-05 ENCOUNTER — Ambulatory Visit: Payer: Medicare HMO | Admitting: Podiatry

## 2022-12-05 ENCOUNTER — Inpatient Hospital Stay: Payer: Medicare HMO | Attending: Oncology

## 2022-12-05 DIAGNOSIS — E1169 Type 2 diabetes mellitus with other specified complication: Secondary | ICD-10-CM | POA: Diagnosis not present

## 2022-12-05 DIAGNOSIS — Z8673 Personal history of transient ischemic attack (TIA), and cerebral infarction without residual deficits: Secondary | ICD-10-CM | POA: Insufficient documentation

## 2022-12-05 DIAGNOSIS — Z862 Personal history of diseases of the blood and blood-forming organs and certain disorders involving the immune mechanism: Secondary | ICD-10-CM | POA: Insufficient documentation

## 2022-12-05 DIAGNOSIS — E538 Deficiency of other specified B group vitamins: Secondary | ICD-10-CM | POA: Diagnosis present

## 2022-12-05 DIAGNOSIS — D509 Iron deficiency anemia, unspecified: Secondary | ICD-10-CM

## 2022-12-05 DIAGNOSIS — B351 Tinea unguium: Secondary | ICD-10-CM | POA: Diagnosis not present

## 2022-12-05 DIAGNOSIS — E1151 Type 2 diabetes mellitus with diabetic peripheral angiopathy without gangrene: Secondary | ICD-10-CM | POA: Diagnosis not present

## 2022-12-05 DIAGNOSIS — E1351 Other specified diabetes mellitus with diabetic peripheral angiopathy without gangrene: Secondary | ICD-10-CM

## 2022-12-05 DIAGNOSIS — Z87891 Personal history of nicotine dependence: Secondary | ICD-10-CM | POA: Insufficient documentation

## 2022-12-05 MED ORDER — CYANOCOBALAMIN 1000 MCG/ML IJ SOLN
1000.0000 ug | Freq: Once | INTRAMUSCULAR | Status: AC
  Administered 2022-12-05: 1000 ug via INTRAMUSCULAR
  Filled 2022-12-05: qty 1

## 2022-12-05 NOTE — Progress Notes (Signed)
This patient returns to my office for at risk foot care.  This patient requires this care by a professional since this patient will be at risk due to having diabetes PVD and thrombocytopenia.  patient is unable to cut nails himself since the patient cannot reach his nails.These nails are painful walking and wearing shoes.  This patient presents for at risk foot care today.  General Appearance  Alert, conversant and in no acute stress.  Vascular  Dorsalis pedis and posterior tibial  pulses are  weakly palpable  bilaterally.  Capillary return is within normal limits  bilaterally. Cold feet. bilaterally.  Neurologic  Senn-Weinstein monofilament wire test within normal limits  bilaterally. Muscle power within normal limits bilaterally.  Nails Thick disfigured discolored nails with subungual debris  from hallux to fifth toes bilaterally. No evidence of bacterial infection or drainage bilaterally.  Orthopedic  No limitations of motion  feet .  No crepitus or effusions noted.  No bony pathology or digital deformities noted.  Skin  normotropic skin with no porokeratosis noted bilaterally.  No signs of infections or ulcers noted.     Onychomycosis  Pain in right toes  Pain in left toes  Consent was obtained for treatment procedures.   Mechanical debridement of nails 1-5  bilaterally performed with a nail nipper.  Filed with dremel without incident.    Return office visit   3 months                   Told patient to return for periodic foot care and evaluation due to potential at risk complications.   Gardiner Barefoot DPM

## 2022-12-05 NOTE — Patient Instructions (Signed)
Vitamin B12 Deficiency Vitamin B12 deficiency occurs when the body does not have enough of this important vitamin. The body needs this vitamin: To make red blood cells. To make DNA. This is the genetic material inside cells. To help the nerves work properly so they can carry messages from the brain to the body. Vitamin B12 deficiency can cause health problems, such as not having enough red blood cells in the blood (anemia). This can lead to nerve damage if untreated. What are the causes? This condition may be caused by: Not eating enough foods that contain vitamin B12. Not having enough stomach acid and digestive fluids to properly absorb vitamin B12 from the food that you eat. Having certain diseases that make it hard to absorb vitamin B12. These diseases include Crohn's disease, chronic pancreatitis, and cystic fibrosis. An autoimmune disorder in which the body does not make enough of a protein (intrinsic factor) within the stomach, resulting in not enough absorption of vitamin B12. Having a surgery in which part of the stomach or small intestine is removed. Taking certain medicines that make it hard for the body to absorb vitamin B12. These include: Heartburn medicines, such as antacids and proton pump inhibitors. Some medicines that are used to treat diabetes. What increases the risk? The following factors may make you more likely to develop a vitamin B12 deficiency: Being an older adult. Eating a vegetarian or vegan diet that does not include any foods that come from animals. Eating a poor diet while you are pregnant. Taking certain medicines. Having alcoholism. What are the signs or symptoms? In some cases, there are no symptoms of this condition. If the condition leads to anemia or nerve damage, various symptoms may occur, such as: Weakness. Tiredness (fatigue). Loss of appetite. Numbness or tingling in your hands and feet. Redness and burning of the tongue. Depression,  confusion, or memory problems. Trouble walking. If anemia is severe, symptoms can include: Shortness of breath. Dizziness. Rapid heart rate. How is this diagnosed? This condition may be diagnosed with a blood test to measure the level of vitamin B12 in your blood. You may also have other tests, including: A group of tests that measure certain characteristics of blood cells (complete blood count, CBC). A blood test to measure intrinsic factor. A procedure where a thin tube with a camera on the end is used to look into your stomach or intestines (endoscopy). Other tests may be needed to discover the cause of the deficiency. How is this treated? Treatment for this condition depends on the cause. This condition may be treated by: Changing your eating and drinking habits, such as: Eating more foods that contain vitamin B12. Drinking less alcohol or no alcohol. Getting vitamin B12 injections. Taking vitamin B12 supplements by mouth (orally). Your health care provider will tell you which dose is best for you. Follow these instructions at home: Eating and drinking  Include foods in your diet that come from animals and contain a lot of vitamin B12. These include: Meats and poultry. This includes beef, pork, chicken, turkey, and organ meats, such as liver. Seafood. This includes clams, rainbow trout, salmon, tuna, and haddock. Eggs. Dairy foods such as milk, yogurt, and cheese. Eat foods that have vitamin B12 added to them (are fortified), such as ready-to-eat breakfast cereals. Check the label on the package to see if a food is fortified. The items listed above may not be a complete list of foods and beverages you can eat and drink. Contact a dietitian for   more information. Alcohol use Do not drink alcohol if: Your health care provider tells you not to drink. You are pregnant, may be pregnant, or are planning to become pregnant. If you drink alcohol: Limit how much you have to: 0-1 drink a  day for women. 0-2 drinks a day for men. Know how much alcohol is in your drink. In the [place] one drink equals one 12 oz bottle of beer (355 mL), one 5 oz glass of wine (148 mL), or one 1 oz glass of hard liquor (44 mL). General instructions Get vitamin B12 injections if told to by your health care provider. Take supplements only as told by your health care provider. Follow the directions carefully. Keep all follow-up visits. This is important. Contact a health care provider if: Your symptoms come back. Your symptoms get worse or do not improve with treatment. Get help right away: You develop shortness of breath. You have a rapid heart rate. You have chest pain. You become dizzy or you faint. These symptoms may be an emergency. Get help right away. Call 911. Do not wait to see if the symptoms will go away. Do not drive yourself to the hospital. Summary Vitamin B12 deficiency occurs when the body does not have enough of this important vitamin. Common causes include not eating enough foods that contain vitamin B12, not being able to absorb vitamin B12 from the food that you eat, having a surgery in which part of the stomach or small intestine is removed, or taking certain medicines. Eat foods that have vitamin B12 in them. Treatment may include making a change in the way you eat and drink, getting vitamin B12 injections, or taking vitamin B12 supplements. This information is not intended to replace advice given to you by your health care provider. Make sure you discuss any questions you have with your health care provider. Document Revised: 05/10/2021 Document Reviewed: 05/10/2021 Elsevier Patient Education  2023 [employer]  

## 2022-12-09 ENCOUNTER — Inpatient Hospital Stay: Payer: Medicare HMO

## 2022-12-09 ENCOUNTER — Inpatient Hospital Stay: Payer: Medicare HMO | Admitting: Hematology

## 2022-12-09 ENCOUNTER — Telehealth: Payer: Self-pay | Admitting: Hematology

## 2022-12-09 ENCOUNTER — Other Ambulatory Visit: Payer: Medicare HMO

## 2022-12-09 NOTE — Progress Notes (Shared)
HEMATOLOGY/ONCOLOGY CONSULTATION NOTE  Date of Service: 12/09/2022  Patient Care Team: Wannetta Sender, FNP as PCP - General (Family Medicine) Evans Lance, MD as PCP - Electrophysiology (Cardiology)  CHIEF COMPLAINTS/PURPOSE OF CONSULTATION:  Iron Deficiency anemia   CURRENT THERAPY:     B-12 1000 mcg IM injection monthly started in 2008.   IV iron infusion when needed.  Last treatment given in November 2023.  HISTORY OF PRESENTING ILLNESS:  [name] is a wonderful 77 y.o. male who is here for continued evaluation and management of iron deficiency anemia. Patient is transferred to [place] from Dr. Alen Blew.   Patient was diagnosed with multifactorial anemia related to B-12 deficiency and iron deficiency due to poor absorption in 2008. Patient had B-12 1000 mcg IM injection monthly started in 2008. He's last IV Iron infusion was in November 2023.   Patient was last seen by Dr. Alen Blew on 05/02/2022 and he was doing well overall.      MEDICAL HISTORY:  Past Medical History:  Diagnosis Date   Anemia    Multifactorial  - hx of B12 def and iron deficiency, followed by heme, followed at [facility]   Automatic implantable cardioverter-defibrillator in situ    CAD (coronary artery disease)    a. STEMI s/p CABG 07/2007 (had preop cardiogenic shock, IABP, VDRF before surgery)   Cardiomyopathy, ischemic    a. Chronic systolic CHF s/p St. Jude dual chamber ICD 03/2008.   Carotid stenosis    a. Prior carotid dz/ surgery. b. Carotid dopp 07/2011 - 0-39% bilaterally.;   c. Doppler 11/13: [id number] RICA, [id number] LICA   Chronic renal insufficiency    Diabetes mellitus    GI bleed    Reported history   Hx of CABG    Hypertension    [facility]    Mitral regurgitation    Myocardial infarction [facility])    Neuromuscular disorder (HCC)    R sided weakness    Osteoarthritis    PAF (paroxysmal atrial fibrillation) ([place])    a. Poor Coumadin candidate due to hx of  GI bleed.   Sinus bradycardia    Sleep apnea    study done, 10 yrs ago, unable to tolerate CPAP   Splenomegaly    Stroke [facility])    a. CVA 1990s - chronic pain in RUE after stroke.   Systolic CHF, chronic (HCC)    Previously taken off lisinopril by primary doctor due to labs   Tobacco abuse     SURGICAL HISTORY: Past Surgical History:  Procedure Laterality Date   CARDIAC DEFIBRILLATOR PLACEMENT  04/05/2008   Implantation of a St. Jude dual-chamber defibrillator, Champ Mungo. [name] , [place] N/A 02/19/2016   Procedure: LAPAROSCOPIC CHOLECYSTECTOMY;  Surgeon: Coralie Keens, MD;  Location: [place];  Service: General;  Laterality: N/A;   COLONOSCOPY  07/2007   Rourk: left-sided diverticulum. TI normal. No etiology for IDA.   CORONARY ARTERY BYPASS GRAFT     CORONARY ARTERY BYPASS GRAFT  08/29/2007   x3; Braulio Conte H. Roxy Manns MD   EP IMPLANTABLE DEVICE N/A 10/05/2015   Procedure: ICD Generator Changeout;  Surgeon: Evans Lance, MD;  Location: [place] CV LAB;  Service: Cardiovascular;  Laterality: N/A;   ERCP N/A 02/18/2016   Procedure: ENDOSCOPIC RETROGRADE CHOLANGIOPANCREATOGRAPHY (ERCP);  Surgeon: Clarene Essex, MD;  Location: [facility] ENDOSCOPY;  Service: Endoscopy;  Laterality: N/A;   ESOPHAGOGASTRODUODENOSCOPY  07/2007  Rourk: small hh, atrophic gastric mucosa, poor distensibility of stomach ?extrinsic compression, CT showed mild to moderate splenomegaly. No etiology for IDA.   JOINT REPLACEMENT  1990's   L knee   LAPAROSCOPIC APPENDECTOMY N/A 09/14/2017   Procedure: APPENDECTOMY LAPAROSCOPIC;  Surgeon: Judeth Horn, MD;  Location: [place];  Service: General;  Laterality: N/A;   Percutaneous coronary intervention  01/10/2005   Eustace Quail, MD   TONSILLECTOMY     TOTAL KNEE ARTHROPLASTY Right 08/16/2013   Procedure: RIGHT TOTAL KNEE ARTHROPLASTY;  Surgeon: Mcarthur Rossetti, MD;  Location: [place];  Service: Orthopedics;  Laterality: Right;     SOCIAL HISTORY: Social History   Socioeconomic History   Marital status: Unknown    Spouse name: Not on file   Number of children: Not on file   Years of education: Not on file   Highest education level: Not on file  Occupational History   Occupation: Retired    Fish farm manager: UNEMPLOYED  Tobacco Use   Smoking status: Former    Packs/day: 1.00    Years: 15.00    Total pack years: 15.00    Types: Cigarettes    Quit date: 09/29/1990    Years since quitting: 32.2   Smokeless tobacco: Former    Quit date: 08/09/1988  Vaping Use   Vaping Use: Never used  Substance and Sexual Activity   Alcohol use: No   Drug use: No   Sexual activity: Not Currently  Other Topics Concern   Not on file  Social History Narrative   ** Merged History Encounter **       Lives in [place] with wife   Been on disability since his stroke in the 1990s   Not routinely exercising   Social Determinants of Health   Financial Resource Strain: Not on file  Food Insecurity: Not on file  Transportation Needs: Not on file  Physical Activity: Not on file  Stress: Not on file  Social Connections: Not on file  Intimate Partner Violence: Not on file    FAMILY HISTORY: Family History  Problem Relation Age of Onset   Heart attack Mother        CVA, MI   Diabetes Mother    Heart attack Father        MI   Diabetes Sister    Coronary artery disease Brother        CABG    ALLERGIES:  is allergic to codeine.  MEDICATIONS:  Current Outpatient Medications  Medication Sig Dispense Refill   acetaminophen (TYLENOL) 500 MG tablet Take 1 tablet (500 mg total) by mouth every 6 (six) hours as needed. 30 tablet 0   atorvastatin (LIPITOR) 40 MG tablet Take 40 mg by mouth daily.     baclofen (LIORESAL) 20 MG tablet Take 40 mg by mouth at bedtime.      carvedilol (COREG) 25 MG tablet Take 1 tablet (25 mg total) by mouth 2 (two) times daily with a meal. 180 tablet 3   cyclobenzaprine (FLEXERIL) 10 MG tablet  Take 1 tablet (10 mg total) by mouth 2 (two) times daily as needed for muscle spasms. 20 tablet 0   DULoxetine (CYMBALTA) 30 MG capsule Take 30 mg by mouth daily.     furosemide (LASIX) 20 MG tablet Take 1 tablet (20 mg total) by mouth 2 (two) times daily. 30 tablet    glimepiride (AMARYL) 4 MG tablet Take 1 tablet (4 mg total) by mouth daily with breakfast.     linagliptin (  TRADJENTA) 5 MG TABS tablet Take 5 mg by mouth daily after breakfast.      meclizine (ANTIVERT) 25 MG tablet Take 25 mg by mouth 4 (four) times daily as needed. For dizziness     metFORMIN (GLUCOPHAGE) 500 MG tablet Take 500 mg by mouth daily with breakfast.     oxyCODONE-acetaminophen (PERCOCET/ROXICET) 5-325 MG tablet Take a half tablet every 6 hours     rivaroxaban (XARELTO) 20 MG TABS tablet Take 1 tablet (20 mg total) by mouth daily with supper. 90 tablet 1   SOLIQUA 100-33 UNT-MCG/ML SOPN Inject 45 Units into the skin daily.     tamsulosin (FLOMAX) 0.4 MG CAPS capsule Take 0.4 mg by mouth daily.     triamcinolone cream (KENALOG) 0.5 % triamcinolone acetonide 0.5 % topical cream  APPLY CREAM TOPICALLY TO AFFECTED AREA TWICE DAILY     No current facility-administered medications for this visit.    REVIEW OF SYSTEMS:    10 Point review of Systems was done is negative except as noted above.  PHYSICAL EXAMINATION: ECOG PERFORMANCE STATUS: {CHL ONC ECOG [id number]  .There were no vitals filed for this visit. There were no vitals filed for this visit. .There is no height or weight on file to calculate BMI.  GENERAL:alert, in no acute distress and comfortable SKIN: no acute rashes, no significant lesions EYES: conjunctiva are pink and non-injected, sclera anicteric OROPHARYNX: MMM, no exudates, no oropharyngeal erythema or ulceration NECK: supple, no JVD LYMPH:  no palpable lymphadenopathy in the cervical, axillary or inguinal regions LUNGS: clear to auscultation b/l with normal respiratory effort HEART:  regular rate & rhythm ABDOMEN:  normoactive bowel sounds , non tender, not distended. Extremity: no pedal edema PSYCH: alert & oriented x 3 with fluent speech NEURO: no focal motor/sensory deficits  LABORATORY DATA:  I have reviewed the data as listed  .    Latest Ref Rng & Units 06/26/2022    9:22 PM 06/02/2022    4:42 AM 06/01/2022    8:20 PM  CBC  WBC 4.0 - 10.5 K/uL 8.1  5.4  6.7   Hemoglobin 13.0 - 17.0 g/dL 11.1  9.8  10.4    10.2   Hematocrit 39.0 - 52.0 % 35.3  29.7  31.5    30.8   Platelets 150 - 400 K/uL 223  133  160     .    Latest Ref Rng & Units 06/26/2022    9:22 PM 06/03/2022    3:57 AM 06/02/2022    4:42 AM  CMP  Glucose 70 - 99 mg/dL 144  112  117   BUN 8 - 23 mg/dL 33  27  43   Creatinine 0.61 - 1.24 mg/dL 1.45  0.92  1.51   Sodium 135 - 145 mmol/L 140  142  139   Potassium 3.5 - 5.1 mmol/L 3.6  3.2  3.0   Chloride 98 - 111 mmol/L 97  110  101   CO2 22 - 32 mmol/L '31  28  30   '$ Calcium 8.9 - 10.3 mg/dL 9.0  7.4  8.3   Total Protein 6.5 - 8.1 g/dL   5.6   Total Bilirubin 0.3 - 1.2 mg/dL   0.8   Alkaline Phos 38 - 126 U/L   78   AST 15 - 41 U/L   15   ALT 0 - 44 U/L   10      RADIOGRAPHIC STUDIES: I have personally reviewed the radiological images as  listed and agreed with the findings in the report. No results found.  ASSESSMENT & PLAN:  77 year old man with:   1.  B12 deficiency diagnosed in 2008.  He remains on related to poor absorption noted in 2008.   2. Iron deficiency anemia related to poor iron absorption diagnosed in 2008.  Iron studies on January 03, 2022 were all within normal range at this time.  We will continue to monitor and supplement as needed.  PLAN: ***  FOLLOW-UP: ***  All of the patients questions were answered with apparent satisfaction. The patient knows to call the clinic with any problems, questions or concerns.  I spent {CHL ONC TIME VISIT - [id number] counseling the patient face to face. The total time spent in  the appointment was {CHL ONC TIME VISIT - [id number] and more than 50% was on counseling and direct patient cares.    Sullivan Lone MD MS AAHIVMS [facility] Hematology/Oncology Physician [facility]  (Office):       [phone] (Work cell):  [phone] (Fax):           [phone]  12/09/2022 10:26 AM    I, Cleda Mccreedy, am acting as a Education administrator for Sullivan Lone, MD.

## 2022-12-09 NOTE — Telephone Encounter (Signed)
Patient called to reschedule today's missed appointments. Patient rescheduled and notified.

## 2022-12-10 ENCOUNTER — Other Ambulatory Visit: Payer: Self-pay

## 2022-12-10 DIAGNOSIS — D509 Iron deficiency anemia, unspecified: Secondary | ICD-10-CM

## 2022-12-11 ENCOUNTER — Inpatient Hospital Stay (HOSPITAL_BASED_OUTPATIENT_CLINIC_OR_DEPARTMENT_OTHER): Payer: Medicare HMO

## 2022-12-11 ENCOUNTER — Inpatient Hospital Stay: Payer: Medicare HMO | Admitting: Hematology

## 2022-12-11 ENCOUNTER — Other Ambulatory Visit: Payer: Self-pay

## 2022-12-11 VITALS — BP 129/67 | HR 60 | Temp 97.9°F | Resp 13 | Wt 213.8 lb

## 2022-12-11 DIAGNOSIS — E538 Deficiency of other specified B group vitamins: Secondary | ICD-10-CM | POA: Diagnosis not present

## 2022-12-11 DIAGNOSIS — D509 Iron deficiency anemia, unspecified: Secondary | ICD-10-CM

## 2022-12-11 LAB — CMP (CANCER CENTER ONLY)
ALT: 10 U/L (ref 0–44)
AST: 15 U/L (ref 15–41)
Albumin: 4.2 g/dL (ref 3.5–5.0)
Alkaline Phosphatase: 95 U/L (ref 38–126)
Anion gap: 8 (ref 5–15)
BUN: 37 mg/dL — ABNORMAL HIGH (ref 8–23)
CO2: 33 mmol/L — ABNORMAL HIGH (ref 22–32)
Calcium: 8.9 mg/dL (ref 8.9–10.3)
Chloride: 99 mmol/L (ref 98–111)
Creatinine: 1.43 mg/dL — ABNORMAL HIGH (ref 0.61–1.24)
GFR, Estimated: 51 mL/min — ABNORMAL LOW (ref >60)
Glucose, Bld: 244 mg/dL — ABNORMAL HIGH (ref 70–99)
Potassium: 3.3 mmol/L — ABNORMAL LOW (ref 3.5–5.1)
Sodium: 140 mmol/L (ref 135–145)
Total Bilirubin: 0.4 mg/dL (ref 0.3–1.2)
Total Protein: 7.1 g/dL (ref 6.5–8.1)

## 2022-12-11 LAB — CBC WITH DIFFERENTIAL (CANCER CENTER ONLY)
Abs Immature Granulocytes: 0.02 10*3/uL (ref 0.00–0.07)
Basophils Absolute: 0.1 10*3/uL (ref 0.0–0.1)
Basophils Relative: 1 %
Eosinophils Absolute: 0.3 10*3/uL (ref 0.0–0.5)
Eosinophils Relative: 4 %
HCT: 35.8 % — ABNORMAL LOW (ref 39.0–52.0)
Hemoglobin: 11.2 g/dL — ABNORMAL LOW (ref 13.0–17.0)
Immature Granulocytes: 0 %
Lymphocytes Relative: 26 %
Lymphs Abs: 1.7 10*3/uL (ref 0.7–4.0)
MCH: 24.1 pg — ABNORMAL LOW (ref 26.0–34.0)
MCHC: 31.3 g/dL (ref 30.0–36.0)
MCV: 77 fL — ABNORMAL LOW (ref 80.0–100.0)
Monocytes Absolute: 0.8 10*3/uL (ref 0.1–1.0)
Monocytes Relative: 13 %
Neutro Abs: 3.6 10*3/uL (ref 1.7–7.7)
Neutrophils Relative %: 56 %
Platelet Count: 243 10*3/uL (ref 150–400)
RBC: 4.65 MIL/uL (ref 4.22–5.81)
RDW: 17.2 % — ABNORMAL HIGH (ref 11.5–15.5)
WBC Count: 6.4 10*3/uL (ref 4.0–10.5)
nRBC: 0 % (ref 0.0–0.2)

## 2022-12-11 LAB — FERRITIN: Ferritin: 16 ng/mL — ABNORMAL LOW (ref 24–336)

## 2022-12-11 LAB — IRON AND IRON BINDING CAPACITY (CC-WL,HP ONLY)
Iron: 20 ug/dL — ABNORMAL LOW (ref 45–182)
Saturation Ratios: 4 % — ABNORMAL LOW (ref 17.9–39.5)
TIBC: 487 ug/dL — ABNORMAL HIGH (ref 250–450)
UIBC: 467 ug/dL — ABNORMAL HIGH (ref 117–376)

## 2022-12-11 LAB — VITAMIN B12: Vitamin B-12: 621 pg/mL (ref 180–914)

## 2022-12-11 NOTE — Progress Notes (Signed)
HEMATOLOGY/ONCOLOGY CONSULTATION NOTE  Date of Service: 12/11/2022  Patient Care Team: Wannetta Sender, FNP as PCP - General (Family Medicine) Evans Lance, MD as PCP - Electrophysiology (Cardiology)  CHIEF COMPLAINTS/PURPOSE OF CONSULTATION:  Evaluation and management of multifactorial anemia related to B12 deficiency and iron deficiency related to poor absorption diagnosed in 2008   CURRENT THERAPY:    B-12 1000 mcg IM injection monthly started in 2008.   IV iron infusion when needed. Last treatment given in November 2023.  HISTORY OF PRESENTING ILLNESS:   [name] is a wonderful 77 y.o. male who has been a previous patient of Dr. Alen Blew. He is here for evaluation and management of multifactorial anemia related to B12 deficiency and iron deficiency related to poor absorption. Patent was last seen by Dr. Alen Blew on 05/02/2022  and was doing well overall with no new medical concerns.   Today, he denies intestinal issues, acid reflux, bowel surgeries in past, or other abdominal issues. He reports that he has been eating and sleeping well. He reports that his last endoscopy/colonoscopy was over 5 years ago.  Patient has received iron infusions in the past and has tolerated it well. He has not had any issues with his monthly B12 injections.   He reports that he previously had a stroke 28-29 years ago and is unable to hold objects with his right hand.He does however have good ROM in his left hand. He continues to drive independently.  MEDICAL HISTORY:  Past Medical History:  Diagnosis Date   Anemia    Multifactorial  - hx of B12 def and iron deficiency, followed by heme, followed at Cancer center[facility]   Automatic implantable cardioverter-defibrillator in situ    CAD (coronary artery disease)    a. STEMI s/p CABG 07/2007 (had preop cardiogenic shock, IABP, VDRF before surgery)   Cardiomyopathy, ischemic    a. Chronic systolic CHF s/p St. Jude dual chamber ICD  03/2008.   Carotid stenosis    a. Prior carotid dz/ surgery. b. Carotid dopp 07/2011 - 0-39% bilaterally.;   c. Doppler 11/13: [id number] RICA, [id number] LICA   Chronic renal insufficiency    Diabetes mellitus    GI bleed    Reported history   Hx of CABG    Hypertension    [place] [facility]    Mitral regurgitation    Myocardial infarction [facility])    Neuromuscular disorder (HCC)    R sided weakness    Osteoarthritis    PAF (paroxysmal atrial fibrillation) ([place])    a. Poor Coumadin candidate due to hx of GI bleed.   Sinus bradycardia    Sleep apnea    study done, 10 yrs ago, unable to tolerate CPAP   Splenomegaly    Stroke [facility])    a. CVA 1990s - chronic pain in RUE after stroke.   Systolic CHF, chronic (HCC)    Previously taken off lisinopril by primary doctor due to labs   Tobacco abuse     SURGICAL HISTORY: Past Surgical History:  Procedure Laterality Date   CARDIAC DEFIBRILLATOR PLACEMENT  04/05/2008   Implantation of a St. Jude dual-chamber defibrillator, Champ Mungo. [name] , [place] N/A 02/19/2016   Procedure: LAPAROSCOPIC CHOLECYSTECTOMY;  Surgeon: Coralie Keens, MD;  Location: [place];  Service: General;  Laterality: N/A;   COLONOSCOPY  07/2007   Rourk: left-sided diverticulum. TI normal. No etiology for IDA.   CORONARY ARTERY  BYPASS GRAFT     CORONARY ARTERY BYPASS GRAFT  08/29/2007   x3; Braulio Conte H. Roxy Manns MD   EP IMPLANTABLE DEVICE N/A 10/05/2015   Procedure: ICD Generator Changeout;  Surgeon: Evans Lance, MD;  Location: [place] CV LAB;  Service: Cardiovascular;  Laterality: N/A;   ERCP N/A 02/18/2016   Procedure: ENDOSCOPIC RETROGRADE CHOLANGIOPANCREATOGRAPHY (ERCP);  Surgeon: Clarene Essex, MD;  Location: [facility] ENDOSCOPY;  Service: Endoscopy;  Laterality: N/A;   ESOPHAGOGASTRODUODENOSCOPY  07/2007   Rourk: small hh, atrophic gastric mucosa, poor distensibility of stomach ?extrinsic compression, CT showed mild to moderate  splenomegaly. No etiology for IDA.   JOINT REPLACEMENT  1990's   L knee   LAPAROSCOPIC APPENDECTOMY N/A 09/14/2017   Procedure: APPENDECTOMY LAPAROSCOPIC;  Surgeon: Judeth Horn, MD;  Location: [place];  Service: General;  Laterality: N/A;   Percutaneous coronary intervention  01/10/2005   Eustace Quail, MD   TONSILLECTOMY     TOTAL KNEE ARTHROPLASTY Right 08/16/2013   Procedure: RIGHT TOTAL KNEE ARTHROPLASTY;  Surgeon: Mcarthur Rossetti, MD;  Location: [place];  Service: Orthopedics;  Laterality: Right;    SOCIAL HISTORY: Social History   Socioeconomic History   Marital status: Unknown    Spouse name: Not on file   Number of children: Not on file   Years of education: Not on file   Highest education level: Not on file  Occupational History   Occupation: Retired    Fish farm manager: UNEMPLOYED  Tobacco Use   Smoking status: Former    Packs/day: 1.00    Years: 15.00    Additional pack years: 0.00    Total pack years: 15.00    Types: Cigarettes    Quit date: 09/29/1990    Years since quitting: 32.2   Smokeless tobacco: Former    Quit date: 08/09/1988  Vaping Use   Vaping Use: Never used  Substance and Sexual Activity   Alcohol use: No   Drug use: No   Sexual activity: Not Currently  Other Topics Concern   Not on file  Social History Narrative   ** Merged History Encounter **       Lives in [place] with wife   Been on disability since his stroke in the 1990s   Not routinely exercising   Social Determinants of Health   Financial Resource Strain: Not on file  Food Insecurity: Not on file  Transportation Needs: Not on file  Physical Activity: Not on file  Stress: Not on file  Social Connections: Not on file  Intimate Partner Violence: Not on file    FAMILY HISTORY: Family History  Problem Relation Age of Onset   Heart attack Mother        CVA, MI   Diabetes Mother    Heart attack Father        MI   Diabetes Sister    Coronary artery disease Brother         CABG    ALLERGIES:  is allergic to codeine.  MEDICATIONS:  Current Outpatient Medications  Medication Sig Dispense Refill   acetaminophen (TYLENOL) 500 MG tablet Take 1 tablet (500 mg total) by mouth every 6 (six) hours as needed. 30 tablet 0   atorvastatin (LIPITOR) 40 MG tablet Take 40 mg by mouth daily.     baclofen (LIORESAL) 20 MG tablet Take 40 mg by mouth at bedtime.      carvedilol (COREG) 25 MG tablet Take 1 tablet (25 mg total) by mouth 2 (two) times daily with a  meal. 180 tablet 3   cyclobenzaprine (FLEXERIL) 10 MG tablet Take 1 tablet (10 mg total) by mouth 2 (two) times daily as needed for muscle spasms. 20 tablet 0   DULoxetine (CYMBALTA) 30 MG capsule Take 30 mg by mouth daily.     furosemide (LASIX) 20 MG tablet Take 1 tablet (20 mg total) by mouth 2 (two) times daily. 30 tablet    glimepiride (AMARYL) 4 MG tablet Take 1 tablet (4 mg total) by mouth daily with breakfast.     linagliptin (TRADJENTA) 5 MG TABS tablet Take 5 mg by mouth daily after breakfast.      meclizine (ANTIVERT) 25 MG tablet Take 25 mg by mouth 4 (four) times daily as needed. For dizziness     metFORMIN (GLUCOPHAGE) 500 MG tablet Take 500 mg by mouth daily with breakfast.     oxyCODONE-acetaminophen (PERCOCET/ROXICET) 5-325 MG tablet Take a half tablet every 6 hours     rivaroxaban (XARELTO) 20 MG TABS tablet Take 1 tablet (20 mg total) by mouth daily with supper. 90 tablet 1   SOLIQUA 100-33 UNT-MCG/ML SOPN Inject 45 Units into the skin daily.     tamsulosin (FLOMAX) 0.4 MG CAPS capsule Take 0.4 mg by mouth daily.     triamcinolone cream (KENALOG) 0.5 % triamcinolone acetonide 0.5 % topical cream  APPLY CREAM TOPICALLY TO AFFECTED AREA TWICE DAILY     No current facility-administered medications for this visit.    REVIEW OF SYSTEMS:    10 Point review of Systems was done is negative except as noted above.  PHYSICAL EXAMINATION: ECOG PERFORMANCE STATUS: 1 - Symptomatic but completely  ambulatory  . Vitals:   12/11/22 0844  BP: 129/67  Pulse: 60  Resp: 13  Temp: 97.9 F (36.6 C)  SpO2: 99%   Filed Weights   12/11/22 0844  Weight: 213 lb 12.8 oz (97 kg)   .Body mass index is 27.45 kg/m.  GENERAL:alert, in no acute distress and comfortable SKIN: no acute rashes, no significant lesions EYES: conjunctiva are pink and non-injected, sclera anicteric OROPHARYNX: MMM, no exudates, no oropharyngeal erythema or ulceration NECK: supple, no JVD LYMPH:  no palpable lymphadenopathy in the cervical, axillary or inguinal regions LUNGS: clear to auscultation b/l with normal respiratory effort HEART: regular rate & rhythm ABDOMEN:  normoactive bowel sounds , non tender, not distended. Extremity: no pedal edema PSYCH: alert & oriented x 3 with fluent speech NEURO: no focal motor/sensory deficits  LABORATORY DATA:  I have reviewed the data as listed .    Latest Ref Rng & Units 12/11/2022    7:48 AM 06/26/2022    9:22 PM 06/02/2022    4:42 AM  CBC  WBC 4.0 - 10.5 K/uL 6.4  8.1  5.4   Hemoglobin 13.0 - 17.0 g/dL 11.2  11.1  9.8   Hematocrit 39.0 - 52.0 % 35.8  35.3  29.7   Platelets 150 - 400 K/uL 243  223  133    .    Latest Ref Rng & Units 12/11/2022    7:48 AM 06/26/2022    9:22 PM 06/03/2022    3:57 AM  CMP  Glucose 70 - 99 mg/dL 244  144  112   BUN 8 - 23 mg/dL 37  33  27   Creatinine 0.61 - 1.24 mg/dL 1.43  1.45  0.92   Sodium 135 - 145 mmol/L 140  140  142   Potassium 3.5 - 5.1 mmol/L 3.3  3.6  3.2  Chloride 98 - 111 mmol/L 99  97  110   CO2 22 - 32 mmol/L 33  31  28   Calcium 8.9 - 10.3 mg/dL 8.9  9.0  7.4   Total Protein 6.5 - 8.1 g/dL 7.1     Total Bilirubin 0.3 - 1.2 mg/dL 0.4     Alkaline Phos 38 - 126 U/L 95     AST 15 - 41 U/L 15     ALT 0 - 44 U/L 10      . Lab Results  Component Value Date   IRON 20 (L) 12/11/2022   TIBC 487 (H) 12/11/2022   IRONPCTSAT 4 (L) 12/11/2022   (Iron and TIBC)  Lab Results  Component Value Date   FERRITIN  16 (L) 12/11/2022   B 12 -- 621   RADIOGRAPHIC STUDIES: I have personally reviewed the radiological images as listed and agreed with the findings in the report. No results found.  ASSESSMENT & PLAN:   Wonderful 77 y.o.  man with:     1.  B12 deficiency diagnosed in 2008.  He remains on related to poor absorption noted in 2008.    He continues to receive B12 injections without any complications.  B12 level continues to be adequate without any need for any additional supplementation.  His hemoglobin continues to improve as well.     2. Iron deficiency anemia related to poor iron absorption diagnosed in 2008.  Iron studies on January 03, 2022 were all within normal range at this time.  We will continue to monitor and supplement as needed.  PLAN: -Discussed lab results from 12/11/22 with patient. CBC showed WBC of 6.4K, hemoglobin improved to 11.2, and platelets of 243K. -RBCS smaller, likely due to iron deficiency -B12 - 621 -Iron saturation 4%, and ferritin 16 suggesting iron deficiency -discussed option for patient to be seen at [place] would like to continue receiving care at [facility] -Will continue B12 injections at [facility] at this time, patient may take this independently at home at some point -Will switch B12 injections to Flowella instead of IM -Patient's blood sugar is elevated -set up IV Venofer 200mg  weekly x 4 doses -patient shall return to clinic in 4 months for continued monitoring  FOLLOW-UP: Plz schedule monthly [place] B12 inj x 12 IV Venofer 200mg  weekly x 4 doses RTC with Dr Irene Limbo with labs in 4 months  The total time spent in the appointment was 20 minutes* .  All of the patient's questions were answered with apparent satisfaction. The patient knows to call the clinic with any problems, questions or concerns.   Sullivan Lone MD MS AAHIVMS [facility] Hematology/Oncology Physician [facility]  .*Total Encounter Time as defined by the Centers for  Medicare and Medicaid Services includes, in addition to the face-to-face time of a patient visit (documented in the note above) non-face-to-face time: obtaining and reviewing outside history, ordering and reviewing medications, tests or procedures, care coordination (communications with other health care professionals or caregivers) and documentation in the medical record.    I,Mitra Faeizi,acting as a Education administrator for Sullivan Lone, MD.,have documented all relevant documentation on the behalf of Sullivan Lone, MD,as directed by  Sullivan Lone, MD while in the presence of Sullivan Lone, MD.   .I have reviewed the above documentation for accuracy and completeness, and I agree with the above. Brunetta Genera MD

## 2022-12-17 ENCOUNTER — Encounter: Payer: Self-pay | Admitting: Hematology

## 2022-12-25 ENCOUNTER — Other Ambulatory Visit: Payer: Self-pay | Admitting: Physician Assistant

## 2022-12-26 ENCOUNTER — Inpatient Hospital Stay: Payer: Medicare HMO

## 2022-12-29 ENCOUNTER — Telehealth: Payer: Self-pay | Admitting: Hematology

## 2022-12-29 NOTE — Telephone Encounter (Signed)
Patient called to reschedule missed 3/29 appointments. Patient rescheduled and notified.

## 2022-12-30 ENCOUNTER — Ambulatory Visit (INDEPENDENT_AMBULATORY_CARE_PROVIDER_SITE_OTHER): Payer: Medicare HMO

## 2022-12-30 DIAGNOSIS — I255 Ischemic cardiomyopathy: Secondary | ICD-10-CM | POA: Diagnosis not present

## 2023-01-01 LAB — CUP PACEART REMOTE DEVICE CHECK
Battery Remaining Longevity: 13 mo
Battery Remaining Percentage: 16 %
Battery Voltage: 2.74 V
Brady Statistic AP VP Percent: 8.5 %
Brady Statistic AP VS Percent: 67 %
Brady Statistic AS VP Percent: 5.9 %
Brady Statistic AS VS Percent: 17 %
Brady Statistic RA Percent Paced: 72 %
Brady Statistic RV Percent Paced: 14 %
Date Time Interrogation Session: 20240404124650
HighPow Impedance: 68 Ohm
HighPow Impedance: 68 Ohm
Implantable Lead Connection Status: 753985
Implantable Lead Connection Status: 753985
Implantable Lead Implant Date: 20090708
Implantable Lead Implant Date: 20090708
Implantable Lead Location: 753859
Implantable Lead Location: 753860
Implantable Lead Model: 7122
Implantable Pulse Generator Implant Date: 20170106
Lead Channel Impedance Value: 330 Ohm
Lead Channel Impedance Value: 360 Ohm
Lead Channel Pacing Threshold Amplitude: 0.5 V
Lead Channel Pacing Threshold Amplitude: 1 V
Lead Channel Pacing Threshold Pulse Width: 0.5 ms
Lead Channel Pacing Threshold Pulse Width: 0.9 ms
Lead Channel Sensing Intrinsic Amplitude: 2.1 mV
Lead Channel Sensing Intrinsic Amplitude: 8.6 mV
Lead Channel Setting Pacing Amplitude: 2 V
Lead Channel Setting Pacing Amplitude: 2 V
Lead Channel Setting Pacing Pulse Width: 0.9 ms
Lead Channel Setting Sensing Sensitivity: 0.5 mV
Pulse Gen Serial Number: 7306179

## 2023-01-02 ENCOUNTER — Other Ambulatory Visit: Payer: Self-pay

## 2023-01-02 ENCOUNTER — Inpatient Hospital Stay: Payer: Medicare HMO | Attending: Oncology

## 2023-01-02 VITALS — BP 144/65 | HR 61 | Temp 98.0°F | Resp 17 | Wt 213.6 lb

## 2023-01-02 DIAGNOSIS — D509 Iron deficiency anemia, unspecified: Secondary | ICD-10-CM

## 2023-01-02 DIAGNOSIS — D518 Other vitamin B12 deficiency anemias: Secondary | ICD-10-CM | POA: Insufficient documentation

## 2023-01-02 DIAGNOSIS — D508 Other iron deficiency anemias: Secondary | ICD-10-CM | POA: Insufficient documentation

## 2023-01-02 MED ORDER — SODIUM CHLORIDE 0.9 % IV SOLN: Freq: Once | INTRAVENOUS | Status: AC

## 2023-01-02 MED ORDER — SODIUM CHLORIDE 0.9 % IV SOLN
200.0000 mg | INTRAVENOUS | Status: DC
  Administered 2023-01-02: 200 mg via INTRAVENOUS
  Filled 2023-01-02: qty 200

## 2023-01-02 MED ORDER — CYANOCOBALAMIN 1000 MCG/ML IJ SOLN
1000.0000 ug | Freq: Once | INTRAMUSCULAR | Status: AC
  Administered 2023-01-02: 1000 ug via SUBCUTANEOUS
  Filled 2023-01-02: qty 1

## 2023-01-02 NOTE — Patient Instructions (Signed)
Iron Deficiency Anemia, Adult  Iron deficiency anemia is a condition in which the concentration of red blood cells or hemoglobin in the blood is below normal because of too little iron. Hemoglobin is a substance in red blood cells that carries oxygen to the body's tissues. When the concentration of red blood cells or hemoglobin is too low, not enough oxygen reaches these tissues. Iron deficiency anemia is usually long-lasting, and it develops over time. It may or may not cause symptoms. It is a common type of anemia. What are the causes? This condition may be caused by: Not enough iron in the diet. Abnormal absorption in the gut. Blood loss. What increases the risk? You are more likely to develop this condition if you get menstrual periods (menstruate) or are pregnant. What are the signs or symptoms? Symptoms of this condition may include: Pale skin, lips, and nail beds. Weakness, dizziness, and getting tired easily. Shortness of breath when moving or exercising. Cold hands or feet. Mild anemia may not cause any symptoms. How is this diagnosed? This condition is diagnosed based on: Your medical history. A physical exam. Blood tests. How is this treated? This condition is treated by correcting the cause of your iron deficiency. Treatment may involve: Adding iron-rich foods to your diet. Taking iron supplements. If you are pregnant or breastfeeding, you may need to take extra iron because your normal diet usually does not provide the amount of iron that you need. Increasing vitamin C intake. Vitamin C helps your body absorb iron. Your health care provider may recommend that you take iron supplements along with a glass of orange juice or a vitamin C supplement. Medicines to make heavy menstrual flow lighter. Surgery or additional testing procedures to determine the cause of your anemia. You may need repeat blood tests to determine whether treatment is working. If the treatment does not  seem to be working, you may need more tests. Follow these instructions at home: Medicines Take over-the-counter and prescription medicines only as told by your health care provider. This includes iron supplements and vitamins. This is important because too much iron can be harmful. For the best iron absorption, you should take iron supplements when your stomach is empty. If you cannot tolerate them on an empty stomach, you may need to take them with food. Do not drink milk or take antacids at the same time as your iron supplements. Milk and antacids may interfere with how your body absorbs iron. Iron supplements may turn stool (feces) a darker color and it may appear black. If you cannot tolerate taking iron supplements by mouth, talk with your health care provider about taking them through an IV or through an injection into a muscle. Eating and drinking Talk with your health care provider before changing your diet. Your provider may recommend that you eat foods that contain a lot of iron, such as: Liver. Low-fat (lean) beef. Breads and cereals that have iron added to them (are fortified). Eggs. Dried fruit. Dark green, leafy vegetables. To help your body use the iron from iron-rich foods, eat those foods at the same time as fresh fruits and vegetables that are high in vitamin C. Foods that are high in vitamin C include: Oranges. Peppers. Tomatoes. Mangoes. Managing constipation If you are taking an iron supplement, it may cause constipation. To prevent or treat constipation, you may need to: Drink enough fluid to keep your urine pale yellow. Take over-the-counter or prescription medicines. Eat foods that are high in fiber, such   as beans, whole grains, and fresh fruits and vegetables. Limit foods that are high in fat and processed sugars, such as fried or sweet foods. General instructions Return to your normal activities as told by your health care provider. Ask your health care provider  what activities are safe for you. Keep all follow-up visits. Contact a health care provider if: You feel nauseous or you vomit. You feel weak. You become light-headed when getting up from a sitting or lying down position. You have unexplained sweating. You develop symptoms of constipation. You have a heaviness in your chest. You have trouble breathing with physical activity. Get help right away if: You faint. If this happens, do not drive yourself to the hospital. You have an irregular or rapid heartbeat. Summary Iron deficiency anemia is a condition in which the concentration of red blood cells or hemoglobin in the blood is below normal because of too little iron. This condition is treated by correcting the cause of your iron deficiency. Take over-the-counter and prescription medicines only as told by your health care provider. This includes iron supplements and vitamins. To help your body use the iron from iron-rich foods, eat those foods at the same time as fresh fruits and vegetables that are high in vitamin C. Seek medical help if you have signs or symptoms of worsening anemia. This information is not intended to replace advice given to you by your health care provider. Make sure you discuss any questions you have with your health care provider. Document Revised: 10/23/2021 Document Reviewed: 10/23/2021 Elsevier Patient Education  2023 Elsevier Inc.  

## 2023-01-09 ENCOUNTER — Other Ambulatory Visit: Payer: Self-pay

## 2023-01-09 ENCOUNTER — Inpatient Hospital Stay: Payer: Medicare HMO

## 2023-01-09 VITALS — BP 142/60 | HR 60 | Temp 98.2°F | Resp 18

## 2023-01-09 DIAGNOSIS — D518 Other vitamin B12 deficiency anemias: Secondary | ICD-10-CM | POA: Diagnosis not present

## 2023-01-09 DIAGNOSIS — D509 Iron deficiency anemia, unspecified: Secondary | ICD-10-CM

## 2023-01-09 MED ORDER — SODIUM CHLORIDE 0.9 % IV SOLN
200.0000 mg | INTRAVENOUS | Status: DC
  Administered 2023-01-09: 200 mg via INTRAVENOUS
  Filled 2023-01-09: qty 200

## 2023-01-09 MED ORDER — SODIUM CHLORIDE 0.9 % IV SOLN: Freq: Once | INTRAVENOUS | Status: AC

## 2023-01-16 ENCOUNTER — Other Ambulatory Visit: Payer: Self-pay

## 2023-01-16 ENCOUNTER — Inpatient Hospital Stay: Payer: Medicare HMO

## 2023-01-16 VITALS — BP 124/59 | HR 60 | Temp 97.7°F | Resp 16

## 2023-01-16 DIAGNOSIS — D518 Other vitamin B12 deficiency anemias: Secondary | ICD-10-CM | POA: Diagnosis not present

## 2023-01-16 DIAGNOSIS — D509 Iron deficiency anemia, unspecified: Secondary | ICD-10-CM

## 2023-01-16 MED ORDER — SODIUM CHLORIDE 0.9 % IV SOLN
200.0000 mg | INTRAVENOUS | Status: DC
  Administered 2023-01-16: 200 mg via INTRAVENOUS
  Filled 2023-01-16: qty 200

## 2023-01-16 MED ORDER — SODIUM CHLORIDE 0.9 % IV SOLN: Freq: Once | INTRAVENOUS | Status: AC

## 2023-01-16 NOTE — Patient Instructions (Signed)
Iron Deficiency Anemia, Adult  Iron deficiency anemia is a condition in which the concentration of red blood cells or hemoglobin in the blood is below normal because of too little iron. Hemoglobin is a substance in red blood cells that carries oxygen to the body's tissues. When the concentration of red blood cells or hemoglobin is too low, not enough oxygen reaches these tissues. Iron deficiency anemia is usually long-lasting, and it develops over time. It may or may not cause symptoms. It is a common type of anemia. What are the causes? This condition may be caused by: Not enough iron in the diet. Abnormal absorption in the gut. Blood loss. What increases the risk? You are more likely to develop this condition if you get menstrual periods (menstruate) or are pregnant. What are the signs or symptoms? Symptoms of this condition may include: Pale skin, lips, and nail beds. Weakness, dizziness, and getting tired easily. Shortness of breath when moving or exercising. Cold hands or feet. Mild anemia may not cause any symptoms. How is this diagnosed? This condition is diagnosed based on: Your medical history. A physical exam. Blood tests. How is this treated? This condition is treated by correcting the cause of your iron deficiency. Treatment may involve: Adding iron-rich foods to your diet. Taking iron supplements. If you are pregnant or breastfeeding, you may need to take extra iron because your normal diet usually does not provide the amount of iron that you need. Increasing vitamin C intake. Vitamin C helps your body absorb iron. Your health care provider may recommend that you take iron supplements along with a glass of orange juice or a vitamin C supplement. Medicines to make heavy menstrual flow lighter. Surgery or additional testing procedures to determine the cause of your anemia. You may need repeat blood tests to determine whether treatment is working. If the treatment does not  seem to be working, you may need more tests. Follow these instructions at home: Medicines Take over-the-counter and prescription medicines only as told by your health care provider. This includes iron supplements and vitamins. This is important because too much iron can be harmful. For the best iron absorption, you should take iron supplements when your stomach is empty. If you cannot tolerate them on an empty stomach, you may need to take them with food. Do not drink milk or take antacids at the same time as your iron supplements. Milk and antacids may interfere with how your body absorbs iron. Iron supplements may turn stool (feces) a darker color and it may appear black. If you cannot tolerate taking iron supplements by mouth, talk with your health care provider about taking them through an IV or through an injection into a muscle. Eating and drinking Talk with your health care provider before changing your diet. Your provider may recommend that you eat foods that contain a lot of iron, such as: Liver. Low-fat (lean) beef. Breads and cereals that have iron added to them (are fortified). Eggs. Dried fruit. Dark green, leafy vegetables. To help your body use the iron from iron-rich foods, eat those foods at the same time as fresh fruits and vegetables that are high in vitamin C. Foods that are high in vitamin C include: Oranges. Peppers. Tomatoes. Mangoes. Managing constipation If you are taking an iron supplement, it may cause constipation. To prevent or treat constipation, you may need to: Drink enough fluid to keep your urine pale yellow. Take over-the-counter or prescription medicines. Eat foods that are high in fiber, such   as beans, whole grains, and fresh fruits and vegetables. Limit foods that are high in fat and processed sugars, such as fried or sweet foods. General instructions Return to your normal activities as told by your health care provider. Ask your health care provider  what activities are safe for you. Keep all follow-up visits. Contact a health care provider if: You feel nauseous or you vomit. You feel weak. You become light-headed when getting up from a sitting or lying down position. You have unexplained sweating. You develop symptoms of constipation. You have a heaviness in your chest. You have trouble breathing with physical activity. Get help right away if: You faint. If this happens, do not drive yourself to the hospital. You have an irregular or rapid heartbeat. Summary Iron deficiency anemia is a condition in which the concentration of red blood cells or hemoglobin in the blood is below normal because of too little iron. This condition is treated by correcting the cause of your iron deficiency. Take over-the-counter and prescription medicines only as told by your health care provider. This includes iron supplements and vitamins. To help your body use the iron from iron-rich foods, eat those foods at the same time as fresh fruits and vegetables that are high in vitamin C. Seek medical help if you have signs or symptoms of worsening anemia. This information is not intended to replace advice given to you by your health care provider. Make sure you discuss any questions you have with your health care provider. Document Revised: 10/23/2021 Document Reviewed: 10/23/2021 Elsevier Patient Education  2023 Elsevier Inc.  

## 2023-01-23 ENCOUNTER — Inpatient Hospital Stay: Payer: Medicare HMO

## 2023-01-23 ENCOUNTER — Other Ambulatory Visit: Payer: Self-pay

## 2023-01-23 VITALS — BP 127/58 | HR 60 | Temp 97.8°F | Resp 16

## 2023-01-23 DIAGNOSIS — D518 Other vitamin B12 deficiency anemias: Secondary | ICD-10-CM | POA: Diagnosis not present

## 2023-01-23 DIAGNOSIS — D509 Iron deficiency anemia, unspecified: Secondary | ICD-10-CM

## 2023-01-23 MED ORDER — CYANOCOBALAMIN 1000 MCG/ML IJ SOLN
1000.0000 ug | Freq: Once | INTRAMUSCULAR | Status: AC
  Administered 2023-01-23: 1000 ug via SUBCUTANEOUS
  Filled 2023-01-23: qty 1

## 2023-01-23 MED ORDER — SODIUM CHLORIDE 0.9 % IV SOLN
200.0000 mg | INTRAVENOUS | Status: DC
  Administered 2023-01-23: 200 mg via INTRAVENOUS
  Filled 2023-01-23: qty 200

## 2023-01-23 MED ORDER — SODIUM CHLORIDE 0.9 % IV SOLN: Freq: Once | INTRAVENOUS | Status: AC

## 2023-01-23 NOTE — Patient Instructions (Signed)
Iron Sucrose Injection What is this medication? IRON SUCROSE (EYE ern SOO krose) treats low levels of iron (iron deficiency anemia) in people with kidney disease. Iron is a mineral that plays an important role in making red blood cells, which carry oxygen from your lungs to the rest of your body. This medicine may be used for other purposes; ask your health care provider or pharmacist if you have questions. COMMON BRAND NAME(S): Venofer What should I tell my care team before I take this medication? They need to know if you have any of these conditions: Anemia not caused by low iron levels Heart disease High levels of iron in the blood Kidney disease Liver disease An unusual or allergic reaction to iron, other medications, foods, dyes, or preservatives Pregnant or trying to get pregnant Breastfeeding How should I use this medication? This medication is for infusion into a vein. It is given in a hospital or clinic setting. Talk to your care team about the use of this medication in children. While this medication may be prescribed for children as young as 2 years for selected conditions, precautions do apply. Overdosage: If you think you have taken too much of this medicine contact a poison control center or emergency room at once. NOTE: This medicine is only for you. Do not share this medicine with others. What if I miss a dose? Keep appointments for follow-up doses. It is important not to miss your dose. Call your care team if you are unable to keep an appointment. What may interact with this medication? Do not take this medication with any of the following: Deferoxamine Dimercaprol Other iron products This medication may also interact with the following: Chloramphenicol Deferasirox This list may not describe all possible interactions. Give your health care provider a list of all the medicines, herbs, non-prescription drugs, or dietary supplements you use. Also tell them if you smoke,  drink alcohol, or use illegal drugs. Some items may interact with your medicine. What should I watch for while using this medication? Visit your care team regularly. Tell your care team if your symptoms do not start to get better or if they get worse. You may need blood work done while you are taking this medication. You may need to follow a special diet. Talk to your care team. Foods that contain iron include: whole grains/cereals, dried fruits, beans, or peas, leafy green vegetables, and organ meats (liver, kidney). What side effects may I notice from receiving this medication? Side effects that you should report to your care team as soon as possible: Allergic reactions--skin rash, itching, hives, swelling of the face, lips, tongue, or throat Low blood pressure--dizziness, feeling faint or lightheaded, blurry vision Shortness of breath Side effects that usually do not require medical attention (report to your care team if they continue or are bothersome): Flushing Headache Joint pain Muscle pain Nausea Pain, redness, or irritation at injection site This list may not describe all possible side effects. Call your doctor for medical advice about side effects. You may report side effects to FDA at 1-800-FDA-1088. Where should I keep my medication? This medication is given in a hospital or clinic and will not be stored at home. NOTE: This sheet is a summary. It may not cover all possible information. If you have questions about this medicine, talk to your doctor, pharmacist, or health care provider.  2023 Elsevier/Gold Standard (2020-12-27 00:00:00) Vitamin B12 Deficiency Vitamin B12 deficiency means that your body does not have enough vitamin B12. The body   needs this important vitamin: To make red blood cells. To make genes (DNA). To help the nerves work. If you do not have enough vitamin B12 in your body, you can have health problems, such as not having enough red blood cells in the blood  (anemia). What are the causes? Not eating enough foods that contain vitamin B12. Not being able to take in (absorb) vitamin B12 from the food that you eat. Certain diseases. A condition in which the body does not make enough of a certain protein. This results in your body not taking in enough vitamin B12. Having a surgery in which part of the stomach or small intestine is taken out. Taking medicines that make it hard for the body to take in vitamin B12. These include: Heartburn medicines. Some medicines that are used to treat diabetes. What increases the risk? Being an older adult. Eating a vegetarian or vegan diet that does not include any foods that come from animals. Not eating enough foods that contain vitamin B12 while you are pregnant. Taking certain medicines. Having alcoholism. What are the signs or symptoms? In some cases, there are no symptoms. If the condition leads to too few blood cells or nerve damage, symptoms can occur, such as: Feeling weak or tired. Not being hungry. Losing feeling (numbness) or tingling in your hands and feet. Redness and burning of the tongue. Feeling sad (depressed). Confusion or memory problems. Trouble walking. If anemia is very bad, symptoms can include: Being short of breath. Being dizzy. Having a very fast heartbeat. How is this treated? Changing the way you eat and drink, such as: Eating more foods that contain vitamin B12. Drinking little or no alcohol. Getting vitamin B12 shots. Taking vitamin B12 supplements by mouth (orally). Your doctor will tell you the dose that is best for you. Follow these instructions at home: Eating and drinking  Eat foods that come from animals and have a lot of vitamin B12 in them. These include: Meats and poultry. This includes beef, pork, chicken, turkey, and organ meats, such as liver. Seafood, such as clams, rainbow trout, salmon, tuna, and haddock. Eggs. Dairy foods such as milk, yogurt, and  cheese. Eat breakfast cereals that have vitamin B12 added to them (are fortified). Check the label. The items listed above may not be a complete list of foods and beverages you can eat and drink. Contact a dietitian for more information. Alcohol use Do not drink alcohol if: Your doctor tells you not to drink. You are pregnant, may be pregnant, or are planning to become pregnant. If you drink alcohol: Limit how much you have to: 0-1 drink a day for women. 0-2 drinks a day for men. Know how much alcohol is in your drink. In the [place] one drink equals one 12 oz bottle of beer (355 mL), one 5 oz glass of wine (148 mL), or one 1 oz glass of hard liquor (44 mL). General instructions Get any vitamin B12 shots if told by your doctor. Take supplements only as told by your doctor. Follow the directions. Keep all follow-up visits. Contact a doctor if: Your symptoms come back. Your symptoms get worse or do not get better with treatment. Get help right away if: You have trouble breathing. You have a very fast heartbeat. You have chest pain. You get dizzy. You faint. These symptoms may be an emergency. Get help right away. Call 911. Do not wait to see if the symptoms will go away. Do not drive yourself to the   hospital. Summary Vitamin B12 deficiency means that your body is not getting enough of the vitamin. In some cases, there are no symptoms of this condition. Treatment may include making a change in the way you eat and drink, getting shots, or taking supplements. Eat foods that have vitamin B12 in them. This information is not intended to replace advice given to you by your health care provider. Make sure you discuss any questions you have with your health care provider. Document Revised: 05/10/2021 Document Reviewed: 05/10/2021 Elsevier Patient Education  2023 Elsevier Inc.   

## 2023-01-23 NOTE — Progress Notes (Signed)
Patient declined to stay for 30 minute post infusion observation period. Per patient, has tolerated previous infusions well with no adverse s/s. VS consistent with previous at time of discharge. Ambulated independently to lobby, AVS reviewed.

## 2023-02-10 NOTE — Progress Notes (Signed)
Remote ICD transmission.   

## 2023-02-18 ENCOUNTER — Telehealth: Payer: Self-pay | Admitting: Hematology

## 2023-02-20 ENCOUNTER — Other Ambulatory Visit: Payer: Self-pay

## 2023-02-20 ENCOUNTER — Inpatient Hospital Stay: Payer: Medicare HMO | Attending: Oncology

## 2023-02-20 ENCOUNTER — Inpatient Hospital Stay: Payer: Medicare HMO

## 2023-02-20 DIAGNOSIS — D518 Other vitamin B12 deficiency anemias: Secondary | ICD-10-CM | POA: Diagnosis not present

## 2023-02-20 DIAGNOSIS — D509 Iron deficiency anemia, unspecified: Secondary | ICD-10-CM

## 2023-02-20 MED ORDER — CYANOCOBALAMIN 1000 MCG/ML IJ SOLN
1000.0000 ug | Freq: Once | INTRAMUSCULAR | Status: AC
  Administered 2023-02-20: 1000 ug via SUBCUTANEOUS
  Filled 2023-02-20: qty 1

## 2023-02-26 ENCOUNTER — Ambulatory Visit (INDEPENDENT_AMBULATORY_CARE_PROVIDER_SITE_OTHER): Payer: Medicare HMO | Admitting: Podiatry

## 2023-02-26 ENCOUNTER — Encounter: Payer: Self-pay | Admitting: Podiatry

## 2023-02-26 DIAGNOSIS — B351 Tinea unguium: Secondary | ICD-10-CM | POA: Diagnosis not present

## 2023-02-26 DIAGNOSIS — E1351 Other specified diabetes mellitus with diabetic peripheral angiopathy without gangrene: Secondary | ICD-10-CM

## 2023-02-26 DIAGNOSIS — E1151 Type 2 diabetes mellitus with diabetic peripheral angiopathy without gangrene: Secondary | ICD-10-CM

## 2023-02-26 NOTE — Progress Notes (Signed)
This patient returns to my office for at risk foot care.  This patient requires this care by a professional since this patient will be at risk due to having diabetes PVD and thrombocytopenia.  patient is unable to cut nails himself since the patient cannot reach his nails.These nails are painful walking and wearing shoes.  This patient presents for at risk foot care today.  General Appearance  Alert, conversant and in no acute stress.  Vascular  Dorsalis pedis and posterior tibial  pulses are  weakly palpable  bilaterally.  Capillary return is within normal limits  bilaterally. Cold feet. bilaterally.  Neurologic  Senn-Weinstein monofilament wire test within normal limits  bilaterally. Muscle power within normal limits bilaterally.  Nails Thick disfigured discolored nails with subungual debris  from hallux to fifth toes bilaterally. No evidence of bacterial infection or drainage bilaterally.  Orthopedic  No limitations of motion  feet .  No crepitus or effusions noted.  No bony pathology or digital deformities noted.  Skin  normotropic skin with no porokeratosis noted bilaterally.  No signs of infections or ulcers noted.     Onychomycosis  Pain in right toes  Pain in left toes  Consent was obtained for treatment procedures.   Mechanical debridement of nails 1-5  bilaterally performed with a nail nipper.  Filed with dremel without incident.    Return office visit   3 months                   Told patient to return for periodic foot care and evaluation due to potential at risk complications.   Brighten Buzzelli DPM   

## 2023-03-06 ENCOUNTER — Ambulatory Visit: Payer: Medicare HMO | Admitting: Podiatry

## 2023-03-17 ENCOUNTER — Telehealth: Payer: Self-pay | Admitting: Hematology

## 2023-03-20 ENCOUNTER — Inpatient Hospital Stay: Payer: Medicare HMO | Attending: Oncology

## 2023-03-20 ENCOUNTER — Other Ambulatory Visit: Payer: Self-pay

## 2023-03-20 ENCOUNTER — Inpatient Hospital Stay: Payer: Medicare HMO

## 2023-03-20 VITALS — BP 134/65 | HR 69 | Temp 97.8°F | Resp 17

## 2023-03-20 DIAGNOSIS — D518 Other vitamin B12 deficiency anemias: Secondary | ICD-10-CM | POA: Diagnosis present

## 2023-03-20 DIAGNOSIS — D509 Iron deficiency anemia, unspecified: Secondary | ICD-10-CM

## 2023-03-20 MED ORDER — CYANOCOBALAMIN 1000 MCG/ML IJ SOLN
1000.0000 ug | Freq: Once | INTRAMUSCULAR | Status: AC
  Administered 2023-03-20: 1000 ug via SUBCUTANEOUS
  Filled 2023-03-20: qty 1

## 2023-03-20 NOTE — Patient Instructions (Signed)

## 2023-03-26 ENCOUNTER — Telehealth: Payer: Self-pay | Admitting: Hematology

## 2023-03-28 ENCOUNTER — Emergency Department (HOSPITAL_COMMUNITY)
Admission: EM | Admit: 2023-03-28 | Discharge: 2023-03-28 | Disposition: A | Discharge status: Home / Self Care | Payer: Medicare HMO | Attending: Emergency Medicine | Admitting: Emergency Medicine

## 2023-03-28 ENCOUNTER — Other Ambulatory Visit: Payer: Self-pay

## 2023-03-28 ENCOUNTER — Emergency Department (HOSPITAL_COMMUNITY): Payer: Medicare HMO

## 2023-03-28 DIAGNOSIS — Z7901 Long term (current) use of anticoagulants: Secondary | ICD-10-CM | POA: Diagnosis not present

## 2023-03-28 DIAGNOSIS — M25561 Pain in right knee: Secondary | ICD-10-CM | POA: Diagnosis present

## 2023-03-28 DIAGNOSIS — Z794 Long term (current) use of insulin: Secondary | ICD-10-CM | POA: Insufficient documentation

## 2023-03-28 NOTE — ED Provider Notes (Signed)
[place] EMERGENCY DEPARTMENT AT [facility] Provider Note   CSN: [id number] Arrival date & time: 03/28/23  4098     History {Add pertinent medical, surgical, social history, OB history to HPI:1} Chief Complaint  Patient presents with   Knee Pain    [name] is a 77 y.o. male.  Patient has history of stroke and he has been having pain in his right knee with contractions of his muscles.   Knee Pain      Home Medications Prior to Admission medications   Medication Sig Start Date End Date Taking? Authorizing Provider  acetaminophen (TYLENOL) 500 MG tablet Take 1 tablet (500 mg total) by mouth every 6 (six) hours as needed. 12/22/21   Horton, Mayer Masker, MD  atorvastatin (LIPITOR) 40 MG tablet Take 40 mg by mouth daily. 07/16/20   [provider]  baclofen (LIORESAL) 20 MG tablet Take 40 mg by mouth at bedtime.     [provider]  carvedilol (COREG) 25 MG tablet Take 1 tablet (25 mg total) by mouth 2 (two) times daily with a meal. 09/12/20   Marinus Maw, MD  cyclobenzaprine (FLEXERIL) 10 MG tablet Take 1 tablet (10 mg total) by mouth 2 (two) times daily as needed for muscle spasms. 12/22/21   Horton, Mayer Masker, MD  DULoxetine (CYMBALTA) 30 MG capsule Take 30 mg by mouth daily.    [provider]  furosemide (LASIX) 20 MG tablet Take 1 tablet (20 mg total) by mouth 2 (two) times daily. 06/06/22   Johnson, Clanford L, MD  glimepiride (AMARYL) 4 MG tablet Take 1 tablet (4 mg total) by mouth daily with breakfast. 06/05/22   Johnson, Clanford L, MD  linagliptin (TRADJENTA) 5 MG TABS tablet Take 5 mg by mouth daily after breakfast.     [provider]  meclizine (ANTIVERT) 25 MG tablet Take 25 mg by mouth 4 (four) times daily as needed. For dizziness 02/26/12   [provider]  metFORMIN (GLUCOPHAGE) 500 MG tablet Take 500 mg by mouth daily with breakfast. 08/26/20   [provider]  oxyCODONE-acetaminophen  (PERCOCET/ROXICET) 5-325 MG tablet Take a half tablet every 6 hours 04/18/19   [provider]  rivaroxaban (XARELTO) 20 MG TABS tablet Take 1 tablet (20 mg total) by mouth daily with supper. 11/24/22   Marinus Maw, MD  SOLIQUA 100-33 UNT-MCG/ML SOPN Inject 45 Units into the skin daily. 07/17/20   [provider]  tamsulosin (FLOMAX) 0.4 MG CAPS capsule Take 0.4 mg by mouth daily.    [provider]  triamcinolone cream (KENALOG) 0.5 % triamcinolone acetonide 0.5 % topical cream  APPLY CREAM TOPICALLY TO AFFECTED AREA TWICE DAILY    [provider]      Allergies    Codeine    Review of Systems   Review of Systems  Physical Exam Updated Vital Signs BP (!) 149/70   Pulse 65   Temp (!) 97.5 F (36.4 C)   Resp 17   Ht 6\' 1"  (1.854 m)   Wt 95.3 kg   SpO2 96%   BMI 27.71 kg/m  Physical Exam  ED Results / Procedures / Treatments   Labs (all labs ordered are listed, but only abnormal results are displayed) Labs Reviewed - No data to display  EKG None  Radiology DG Knee Complete 4 Views Right  Result Date: 03/28/2023 CLINICAL DATA:  Right knee pain that started yesterday. No known injury. EXAM: RIGHT KNEE - COMPLETE  4+ VIEW COMPARISON:  Right knee radiographs 05/16/2017 FINDINGS: There is diffuse decreased bone mineralization. Redemonstration of total right knee arthroplasty. Two surgical clips overlie the superomedial knee soft tissues, unchanged. No joint effusion. No acute fracture or dislocation. Mild-to-moderate atherosclerotic calcifications. IMPRESSION: Total right knee arthroplasty without evidence of complication. Electronically Signed   By: Neita Garnet M.D.   On: 03/28/2023 10:35    Procedures Procedures  {Document cardiac monitor, telemetry assessment procedure when appropriate:1}  Medications Ordered in ED Medications - No data to display  ED Course/ Medical Decision Making/ A&P   {   Click here for ABCD2, HEART and other  calculatorsREFRESH Note before signing :1}                          Medical Decision Making Amount and/or Complexity of Data Reviewed Radiology: ordered.   Right knee and lower leg pain possibly related to neuritis.  I offered Neurontin but he wants to follow-up with his PCP  {Document critical care time when appropriate:1} {Document review of labs and clinical decision tools ie heart score, Chads2Vasc2 etc:1}  {Document your independent review of radiology images, and any outside records:1} {Document your discussion with family members, caretakers, and with consultants:1} {Document social determinants of health affecting pt's care:1} {Document your decision making why or why not admission, treatments were needed:1} Final Clinical Impression(s) / ED Diagnoses Final diagnoses:  Acute pain of right knee    Rx / DC Orders ED Discharge Orders     None

## 2023-03-28 NOTE — ED Triage Notes (Signed)
Pt c/o right knee pain that started yesterday. Pt reports no injury to knee. Pt able to ambulate.

## 2023-03-28 NOTE — ED Notes (Signed)
Patient given call bell and urinal. Family at bedside. Nurse aware

## 2023-03-28 NOTE — Discharge Instructions (Signed)
Follow-up with your family doctor if continued problems 

## 2023-04-08 ENCOUNTER — Ambulatory Visit (INDEPENDENT_AMBULATORY_CARE_PROVIDER_SITE_OTHER): Payer: Medicare HMO

## 2023-04-08 ENCOUNTER — Ambulatory Visit: Payer: Medicare HMO | Attending: Cardiology

## 2023-04-08 DIAGNOSIS — I428 Other cardiomyopathies: Secondary | ICD-10-CM

## 2023-04-08 LAB — CUP PACEART INCLINIC DEVICE CHECK
Battery Remaining Longevity: 11 mo
Brady Statistic RA Percent Paced: 68 %
Brady Statistic RV Percent Paced: 12 %
Date Time Interrogation Session: 20240710142347
HighPow Impedance: 73.125
Implantable Lead Connection Status: 753985
Implantable Lead Connection Status: 753985
Implantable Lead Implant Date: 20090708
Implantable Lead Implant Date: 20090708
Implantable Lead Location: 753859
Implantable Lead Location: 753860
Implantable Lead Model: 7122
Implantable Pulse Generator Implant Date: 20170106
Lead Channel Impedance Value: 337.5 Ohm
Lead Channel Impedance Value: 362.5 Ohm
Lead Channel Pacing Threshold Amplitude: 0.5 V
Lead Channel Pacing Threshold Amplitude: 0.5 V
Lead Channel Pacing Threshold Amplitude: 1.25 V
Lead Channel Pacing Threshold Amplitude: 1.25 V
Lead Channel Pacing Threshold Pulse Width: 0.5 ms
Lead Channel Pacing Threshold Pulse Width: 0.5 ms
Lead Channel Pacing Threshold Pulse Width: 0.9 ms
Lead Channel Pacing Threshold Pulse Width: 0.9 ms
Lead Channel Sensing Intrinsic Amplitude: 2.4 mV
Lead Channel Sensing Intrinsic Amplitude: 9.1 mV
Lead Channel Setting Pacing Amplitude: 2 V
Lead Channel Setting Pacing Amplitude: 2.5 V
Lead Channel Setting Pacing Pulse Width: 0.9 ms
Lead Channel Setting Sensing Sensitivity: 0.5 mV
Pulse Gen Serial Number: 7306179

## 2023-04-08 NOTE — Progress Notes (Signed)
Pt  seen in clinic d/t error message received attempting to send remote transmission. Pt interrogated.  RF telemetry reset.  Transmission sent and received. Follow up scheduled in October 2024 with Dr. Ladona Ridgel.

## 2023-04-08 NOTE — Patient Instructions (Signed)
Medication Instructions:  Your physician recommends that you continue on your current medications as directed. Please refer to the Current Medication list given to you today.  Labwork: None ordered.  Testing/Procedures: None ordered.  Follow-Up: Your physician wants you to follow-up July 09, 2023 at 9:30 am with Dr. Ladona Ridgel in [place].  Remote monitoring is used to monitor your device from home. This monitoring reduces the number of office visits required to check your device to one time per year. It allows [place] to keep an eye on the functioning of your device to ensure it is working properly. You are scheduled for a device check from home on 06/30/2023. You may send your transmission at any time that day. If you have a wireless device, the transmission will be sent automatically. After your physician reviews your transmission, you will receive a postcard with your next transmission date.

## 2023-04-09 LAB — CUP PACEART REMOTE DEVICE CHECK
Battery Remaining Longevity: 10 mo
Battery Remaining Percentage: 12 %
Battery Voltage: 2.71 V
Brady Statistic AP VP Percent: 1.3 %
Brady Statistic AP VS Percent: 55 %
Brady Statistic AS VP Percent: 1 %
Brady Statistic AS VS Percent: 42 %
Brady Statistic RA Percent Paced: 52 %
Brady Statistic RV Percent Paced: 1.9 %
Date Time Interrogation Session: 20240710141017
HighPow Impedance: 69 Ohm
HighPow Impedance: 73 Ohm
Implantable Lead Connection Status: 753985
Implantable Lead Connection Status: 753985
Implantable Lead Implant Date: 20090708
Implantable Lead Implant Date: 20090708
Implantable Lead Location: 753859
Implantable Lead Location: 753860
Implantable Lead Model: 7122
Implantable Pulse Generator Implant Date: 20170106
Lead Channel Impedance Value: 340 Ohm
Lead Channel Impedance Value: 360 Ohm
Lead Channel Pacing Threshold Amplitude: 0.5 V
Lead Channel Pacing Threshold Amplitude: 1.25 V
Lead Channel Pacing Threshold Pulse Width: 0.5 ms
Lead Channel Pacing Threshold Pulse Width: 0.9 ms
Lead Channel Sensing Intrinsic Amplitude: 2.4 mV
Lead Channel Sensing Intrinsic Amplitude: 9.1 mV
Lead Channel Setting Pacing Amplitude: 2 V
Lead Channel Setting Pacing Amplitude: 2.5 V
Lead Channel Setting Pacing Pulse Width: 0.9 ms
Lead Channel Setting Sensing Sensitivity: 0.5 mV
Pulse Gen Serial Number: 7306179

## 2023-04-23 ENCOUNTER — Other Ambulatory Visit: Payer: Self-pay

## 2023-04-23 DIAGNOSIS — D509 Iron deficiency anemia, unspecified: Secondary | ICD-10-CM

## 2023-04-23 DIAGNOSIS — E538 Deficiency of other specified B group vitamins: Secondary | ICD-10-CM

## 2023-04-24 ENCOUNTER — Inpatient Hospital Stay: Payer: Medicare HMO | Admitting: Hematology

## 2023-04-24 ENCOUNTER — Other Ambulatory Visit: Payer: Self-pay

## 2023-04-24 ENCOUNTER — Inpatient Hospital Stay: Payer: Medicare HMO | Admitting: Physician Assistant

## 2023-04-24 ENCOUNTER — Inpatient Hospital Stay: Payer: Medicare HMO

## 2023-04-24 ENCOUNTER — Telehealth: Payer: Self-pay | Admitting: Physician Assistant

## 2023-04-24 ENCOUNTER — Inpatient Hospital Stay: Payer: Medicare HMO | Attending: Oncology

## 2023-04-24 VITALS — BP 140/67 | HR 78 | Temp 98.3°F | Resp 16

## 2023-04-24 DIAGNOSIS — D518 Other vitamin B12 deficiency anemias: Secondary | ICD-10-CM | POA: Diagnosis present

## 2023-04-24 DIAGNOSIS — D509 Iron deficiency anemia, unspecified: Secondary | ICD-10-CM

## 2023-04-24 DIAGNOSIS — E538 Deficiency of other specified B group vitamins: Secondary | ICD-10-CM

## 2023-04-24 LAB — CMP (CANCER CENTER ONLY)
ALT: 11 U/L (ref 0–44)
AST: 15 U/L (ref 15–41)
Albumin: 4.1 g/dL (ref 3.5–5.0)
Alkaline Phosphatase: 94 U/L (ref 38–126)
Anion gap: 6 (ref 5–15)
BUN: 18 mg/dL (ref 8–23)
CO2: 32 mmol/L (ref 22–32)
Calcium: 9.5 mg/dL (ref 8.9–10.3)
Chloride: 106 mmol/L (ref 98–111)
Creatinine: 1.19 mg/dL (ref 0.61–1.24)
GFR, Estimated: >60 mL/min (ref >60)
Glucose, Bld: 67 mg/dL — ABNORMAL LOW (ref 70–99)
Potassium: 4.2 mmol/L (ref 3.5–5.1)
Sodium: 144 mmol/L (ref 135–145)
Total Bilirubin: 0.5 mg/dL (ref 0.3–1.2)
Total Protein: 6.6 g/dL (ref 6.5–8.1)

## 2023-04-24 LAB — CBC WITH DIFFERENTIAL (CANCER CENTER ONLY)
Abs Immature Granulocytes: 0.02 10*3/uL (ref 0.00–0.07)
Basophils Absolute: 0 10*3/uL (ref 0.0–0.1)
Basophils Relative: 0 %
Eosinophils Absolute: 0.2 10*3/uL (ref 0.0–0.5)
Eosinophils Relative: 3 %
HCT: 41.9 % (ref 39.0–52.0)
Hemoglobin: 13.9 g/dL (ref 13.0–17.0)
Immature Granulocytes: 0 %
Lymphocytes Relative: 24 %
Lymphs Abs: 1.8 10*3/uL (ref 0.7–4.0)
MCH: 28.5 pg (ref 26.0–34.0)
MCHC: 33.2 g/dL (ref 30.0–36.0)
MCV: 85.9 fL (ref 80.0–100.0)
Monocytes Absolute: 0.8 10*3/uL (ref 0.1–1.0)
Monocytes Relative: 10 %
Neutro Abs: 4.7 10*3/uL (ref 1.7–7.7)
Neutrophils Relative %: 63 %
Platelet Count: 242 10*3/uL (ref 150–400)
RBC: 4.88 MIL/uL (ref 4.22–5.81)
RDW: 16.7 % — ABNORMAL HIGH (ref 11.5–15.5)
WBC Count: 7.5 10*3/uL (ref 4.0–10.5)
nRBC: 0 % (ref 0.0–0.2)

## 2023-04-24 LAB — IRON AND IRON BINDING CAPACITY (CC-WL,HP ONLY)
Iron: 52 ug/dL (ref 45–182)
Saturation Ratios: 15 % — ABNORMAL LOW (ref 17.9–39.5)
TIBC: 360 ug/dL (ref 250–450)
UIBC: 308 ug/dL (ref 117–376)

## 2023-04-24 LAB — FERRITIN: Ferritin: 31 ng/mL (ref 24–336)

## 2023-04-24 LAB — VITAMIN B12: Vitamin B-12: 381 pg/mL (ref 180–914)

## 2023-04-24 MED ORDER — CYANOCOBALAMIN 1000 MCG/ML IJ SOLN
1000.0000 ug | Freq: Once | INTRAMUSCULAR | Status: AC
  Administered 2023-04-24: 1000 ug via SUBCUTANEOUS
  Filled 2023-04-24: qty 1

## 2023-04-29 ENCOUNTER — Ambulatory Visit: Payer: Medicare HMO | Admitting: Orthopaedic Surgery

## 2023-04-29 ENCOUNTER — Other Ambulatory Visit (INDEPENDENT_AMBULATORY_CARE_PROVIDER_SITE_OTHER): Payer: Medicare HMO

## 2023-04-29 ENCOUNTER — Telehealth: Payer: Self-pay | Admitting: Internal Medicine

## 2023-04-29 DIAGNOSIS — M25551 Pain in right hip: Secondary | ICD-10-CM

## 2023-04-29 MED ORDER — LIDOCAINE HCL 1 % IJ SOLN
3.0000 mL | INTRAMUSCULAR | Status: AC | PRN
Start: 2023-04-29 — End: 2023-04-29
  Administered 2023-04-29: 3 mL

## 2023-04-29 MED ORDER — METHYLPREDNISOLONE ACETATE 40 MG/ML IJ SUSP
40.0000 mg | INTRAMUSCULAR | Status: AC | PRN
Start: 2023-04-29 — End: 2023-04-29
  Administered 2023-04-29: 40 mg via INTRA_ARTICULAR

## 2023-04-29 NOTE — Progress Notes (Signed)
Remote ICD transmission.   

## 2023-04-29 NOTE — Progress Notes (Signed)
The patient is a 77 year old gentleman this listed as a new patient because is been a very long time since have seen him.  We replaced his right knee many years ago.  He has had a stroke affecting the right side and has dealt with right hip pain for about a year now which become a constant pain.  It hurts on the lateral aspect of his hip.  He denies any groin pain.  He is on chronic Xarelto as well.  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  He is a diabetic and reports a hemoglobin A1c of 7.2.  I was able to review his medications and medicines within epic.  On exam his right hip shows some stiffness but I think this is more from the effects of the stroke.  There is a smooth rotation of the hip joint itself.  There are some pain over the trochanteric area but also the sciatic area of the hip.  X-rays of the right hip showed no acute findings.  The hip joint on both sides is well-maintained with no significant arthritic findings.  This certainly could be a sciatic issue as opposed to trochanteric bursitis.  I did place a steroid injection over the trochanteric area on the right side which he tolerated well.  If this ends up not helping the neck step will be working up his spine.  We would recommend outpatient physical therapy for the back and hip area as the next step.  All questions and concerns were addressed and answered.    Procedure Note  Patient: [name]             Date of Birth: 05-18-1946           MRN: [id number]             Visit Date: 04/29/2023  Procedures: Visit Diagnoses:  1. Pain of right hip     Large Joint Inj: R greater trochanter on 04/29/2023 10:05 AM Indications: pain and diagnostic evaluation Details: 22 G 1.5 in needle, lateral approach  Arthrogram: No  Medications: 3 mL lidocaine 1 %; 40 mg methylPREDNISolone acetate 40 MG/ML Outcome: tolerated well, no immediate complications Procedure, treatment alternatives, risks and benefits explained, specific  risks discussed. Consent was given by the patient. Immediately prior to procedure a time out was called to verify the correct patient, procedure, equipment, support staff and site/side marked as required. Patient was prepped and draped in the usual sterile fashion.

## 2023-04-29 NOTE — Telephone Encounter (Signed)
Pt would like to know if he can have an MRI done. Please advise.

## 2023-04-29 NOTE — Telephone Encounter (Signed)
Called patient and let him know that his device does not appear to be MRI compatible, patient voiced understanding

## 2023-05-01 NOTE — Progress Notes (Signed)
Contacted pt per [name] [facility] to: notify patient that his anemia has resolved. Iron levels have improved  Pt acknowledged information and verbalized understanding.

## 2023-05-05 ENCOUNTER — Other Ambulatory Visit: Payer: Self-pay | Admitting: Physician Assistant

## 2023-05-05 DIAGNOSIS — D509 Iron deficiency anemia, unspecified: Secondary | ICD-10-CM

## 2023-05-05 DIAGNOSIS — E538 Deficiency of other specified B group vitamins: Secondary | ICD-10-CM

## 2023-05-06 ENCOUNTER — Other Ambulatory Visit: Payer: Medicare HMO

## 2023-05-06 ENCOUNTER — Ambulatory Visit: Payer: Medicare HMO | Admitting: Physician Assistant

## 2023-05-07 ENCOUNTER — Ambulatory Visit: Payer: Medicare HMO | Attending: Internal Medicine

## 2023-05-22 ENCOUNTER — Inpatient Hospital Stay: Payer: Medicare HMO

## 2023-05-22 ENCOUNTER — Inpatient Hospital Stay: Payer: Medicare HMO | Attending: Hematology

## 2023-05-22 DIAGNOSIS — D518 Other vitamin B12 deficiency anemias: Secondary | ICD-10-CM | POA: Diagnosis present

## 2023-05-22 DIAGNOSIS — D509 Iron deficiency anemia, unspecified: Secondary | ICD-10-CM

## 2023-05-22 MED ORDER — CYANOCOBALAMIN 1000 MCG/ML IJ SOLN
1000.0000 ug | Freq: Once | INTRAMUSCULAR | Status: AC
  Administered 2023-05-22: 1000 ug via SUBCUTANEOUS
  Filled 2023-05-22: qty 1

## 2023-05-22 NOTE — Patient Instructions (Signed)
 Vitamin B12 Injection What is this medication? Vitamin B12 (VAHY tuh min B12) prevents and treats low vitamin B12 levels in your body. It is used in people who do not get enough vitamin B12 from their diet or when their digestive tract does not absorb enough. Vitamin B12 plays an important role in maintaining the health of your nervous system and red blood cells. This medicine may be used for other purposes; ask your health care provider or pharmacist if you have questions. COMMON BRAND NAME(S): B-12 Compliance Kit, B-12 Injection Kit, Cyomin, Dodex, LA-12, Nutri-Twelve, Physicians EZ Use B-12, Primabalt, Vitamin Deficiency Injectable System - B12 What should I tell my care team before I take this medication? They need to know if you have any of these conditions: Kidney disease Leber's disease Megaloblastic anemia An unusual or allergic reaction to cyanocobalamin, cobalt, other medications, foods, dyes, or preservatives Pregnant or trying to get pregnant Breast-feeding How should I use this medication? This medication is injected into a muscle or deeply under the skin. It is usually given in a clinic or care team's office. However, your care team may teach you how to inject yourself. Follow all instructions. Talk to your care team about the use of this medication in children. Special care may be needed. Overdosage: If you think you have taken too much of this medicine contact a poison control center or emergency room at once. NOTE: This medicine is only for you. Do not share this medicine with others. What if I miss a dose? If you are given your dose at a clinic or care team's office, call to reschedule your appointment. If you give your own injections, and you miss a dose, take it as soon as you can. If it is almost time for your next dose, take only that dose. Do not take double or extra doses. What may interact with this medication? Alcohol Colchicine This list may not describe all possible  interactions. Give your health care provider a list of all the medicines, herbs, non-prescription drugs, or dietary supplements you use. Also tell them if you smoke, drink alcohol, or use illegal drugs. Some items may interact with your medicine. What should I watch for while using this medication? Visit your care team regularly. You may need blood work done while you are taking this medication. You may need to follow a special diet. Talk to your care team. Limit your alcohol intake and avoid smoking to get the best benefit. What side effects may I notice from receiving this medication? Side effects that you should report to your care team as soon as possible: Allergic reactions--skin rash, itching, hives, swelling of the face, lips, tongue, or throat Swelling of the ankles, hands, or feet Trouble breathing Side effects that usually do not require medical attention (report to your care team if they continue or are bothersome): Diarrhea This list may not describe all possible side effects. Call your doctor for medical advice about side effects. You may report side effects to FDA at 1-800-FDA-1088. Where should I keep my medication? Keep out of the reach of children. Store at room temperature between 15 and 30 degrees C (59 and 85 degrees F). Protect from light. Throw away any unused medication after the expiration date. NOTE: This sheet is a summary. It may not cover all possible information. If you have questions about this medicine, talk to your doctor, pharmacist, or health care provider.  2024 Elsevier/Gold Standard (2021-05-28 00:00:00)

## 2023-05-26 ENCOUNTER — Ambulatory Visit (HOSPITAL_BASED_OUTPATIENT_CLINIC_OR_DEPARTMENT_OTHER): Payer: Medicare HMO | Admitting: Student

## 2023-05-29 ENCOUNTER — Ambulatory Visit: Payer: Medicare HMO | Admitting: Podiatry

## 2023-05-29 ENCOUNTER — Other Ambulatory Visit: Payer: Self-pay

## 2023-05-29 ENCOUNTER — Ambulatory Visit: Payer: Medicare HMO | Admitting: Orthopedic Surgery

## 2023-05-29 ENCOUNTER — Telehealth: Payer: Self-pay | Admitting: Orthopedic Surgery

## 2023-05-29 DIAGNOSIS — G8929 Other chronic pain: Secondary | ICD-10-CM | POA: Diagnosis not present

## 2023-05-29 DIAGNOSIS — M545 Low back pain, unspecified: Secondary | ICD-10-CM | POA: Diagnosis not present

## 2023-05-29 MED ORDER — PREGABALIN 75 MG PO CAPS: 75.0000 mg | ORAL_CAPSULE | Freq: Two times a day (BID) | ORAL | 0 refills | Status: DC

## 2023-05-29 NOTE — Progress Notes (Signed)
Orthopedic Spine Surgery Office Note  Assessment: Patient is a 77 y.o. male with low back pain that radiates into the right lateral thigh, possible radiculopathy   Plan: -Explained that initially conservative treatment is tried as a significant number of patients may experience relief with these treatment modalities. Discussed that the conservative treatments include:  -activity modification  -physical therapy  -over the counter pain medications  -medrol dosepak  -lumbar steroid injections -Patient has tried advil, tylenol, percocet (pain management) -Prescribed Lyrica for additional pain relief -Patient has tried conservative treatments including Percocet for the last 6 weeks without any relief, so recommended MRI of the lumbar spine to evaluate for radiculopathy -Patient should return to office in 4-5 weeks, x-rays at next visit: None   Patient expressed understanding of the plan and all questions were answered to the patient's satisfaction.   ___________________________________________________________________________   History:  Patient is a 77 y.o. male who presents today for lumbar spine.  Patient has a history of chronic low back pain that has gotten progressively worse with the last 3 months.  He notes that the pain also now radiates into his right lower extremity.  He feels it along the lateral aspect of the thigh.  It does not radiate past the knee.  He is not having any pain radiating into the left lower extremity.  There is no trauma or injury that preceded the onset of this worsening pain.  He has decreased sensation throughout the right upper and lower extremity from a stroke.  No recent changes in numbness or paresthesias.   Weakness: Denies Symptoms of imbalance: Denies Paresthesias and numbness: Yes, has decree sensation on the entire right side of his body from a stroke.  No changes in numbness or paresthesias Bowel or bladder incontinence: Denies Saddle anesthesia:  Denies  Treatments tried: advil, tylenol, percocet (pain management)  Review of systems: Denies fevers and chills, night sweats, unexplained weight loss, history of cancer. Has had pain that wakes him at night  Past medical history: CAD Carotid stenosis Renal insufficiency DM HTN Atrial fibrillation  Mitral regurgitation History of MI OSA CHF  Allergies: codeine  Past surgical history:  Defibrillator placement Cholecystectomy CABG Bilateral knee replacement Appendectomy Tonsillectomy  Social history: Denies use of nicotine product (smoking, vaping, patches, smokeless) Alcohol use: denies Denies recreational drug use   Physical Exam:  BMI of 27.7  General: no acute distress, appears stated age Neurologic: alert, answering questions appropriately, following commands Respiratory: unlabored breathing on room air, symmetric chest rise Psychiatric: appropriate affect, normal cadence to speech   MSK (spine):  -Strength exam      Left  Right EHL    4/5  4/5 TA    5/5  5/5 GSC    5/5  5/5 Knee extension  5/5  5/5 Hip flexion   5/5  4/5  -Sensory exam    Sensation intact to light touch in L3-S1 nerve distributions of bilateral lower extremities (decreased throughout right lower extremity due to stroke)  -Achilles DTR: 1/4 on the left, 1/4 on the right -Patellar tendon DTR: 1/4 on the left, 1/4 on the right  -Straight leg raise: negative bilaterally -Femoral nerve stretch test: negative bilaterally  -Clonus: no beats bilaterally  -Left hip exam: no pain through range of motion, negative stinchfield, negative faber -Right hip exam: no pain through range of motion, negative stinchfield, negative faber  Imaging: XR of the lumbar spine from 05/29/2023 was independently reviewed and interpreted, showing disc height loss at L4/5 and  L5/S1. There is a vacuum disc phenomenon at L4/5. Lumbar degenerative scoliosis with apex to the left at L2/3. No evidence of  instability on flexion/extension views. No fracture or dislocation seen. PI of 39, LL of 32.    Patient name: [name] Patient MRN: [id number] Date of visit: 05/29/23

## 2023-05-29 NOTE — Telephone Encounter (Signed)
Patient called. Says the medication pregabalin needs prior auth. He is not able to pick it up from the pharmacy.

## 2023-06-02 ENCOUNTER — Telehealth: Payer: Self-pay | Admitting: Orthopedic Surgery

## 2023-06-02 NOTE — Telephone Encounter (Signed)
Patient called and said that Dr. Christell Constant have to call Dr. Ladona Ridgel to get the ok to do the surgery. He is the heart doctor. Dr. Ladona Ridgel [phone] and [phone]

## 2023-06-02 NOTE — Telephone Encounter (Signed)
Submitted on cover my meds

## 2023-06-03 NOTE — Telephone Encounter (Signed)
PA Case ID #: [id number] Rx #: [id number] Need Help? Call us at [phone] Outcome Approved on September 3 by [facility] NCPDP 2017 PA Case: [id number], Status: Approved, Coverage Starts on: 09/29/2022 12:00:00 AM, Coverage Ends on: 09/29/2023 12:00:00 AM. Questions? Contact [phone].

## 2023-06-03 NOTE — Telephone Encounter (Signed)
According to doctors last office visit note on 05/29/23 patient is to have an MRI, continue to try conservative treatment, and return to the office for a follow up. Patient has a follow up scheduled 07/08/23. Plan of treatment has not progressed to surgery as of yet. I will be more than happy to send a clearance request if treatment progresses to surgical intervention.

## 2023-06-05 ENCOUNTER — Telehealth: Payer: Self-pay | Admitting: Orthopedic Surgery

## 2023-06-05 NOTE — Telephone Encounter (Signed)
Pt is requesting another location for MRI place he was referred to is a month out, please advise

## 2023-06-08 ENCOUNTER — Ambulatory Visit: Payer: Medicare HMO | Admitting: Podiatry

## 2023-06-09 NOTE — Telephone Encounter (Signed)
Could pt and he stated [facility] stated they never received his referral for MRI. Told pt its in EPIC  and gave him number to call

## 2023-06-10 ENCOUNTER — Telehealth: Payer: Self-pay | Admitting: Orthopedic Surgery

## 2023-06-10 DIAGNOSIS — M5416 Radiculopathy, lumbar region: Secondary | ICD-10-CM

## 2023-06-10 NOTE — Telephone Encounter (Signed)
Orthopedic Telephone Note  Informed by radiology that patient's defibrillator is not safe for MRI so will order CT myelogram to evaluate for radiculopathy instead.   London Sheer, MD Orthopedic Surgeon

## 2023-06-11 ENCOUNTER — Encounter: Payer: Self-pay | Admitting: Orthopedic Surgery

## 2023-06-19 ENCOUNTER — Inpatient Hospital Stay: Payer: Medicare HMO

## 2023-06-19 ENCOUNTER — Inpatient Hospital Stay: Payer: Medicare HMO | Attending: Oncology

## 2023-06-19 DIAGNOSIS — D518 Other vitamin B12 deficiency anemias: Secondary | ICD-10-CM | POA: Insufficient documentation

## 2023-06-22 ENCOUNTER — Telehealth: Payer: Self-pay | Admitting: Hematology and Oncology

## 2023-06-22 ENCOUNTER — Other Ambulatory Visit: Payer: Self-pay

## 2023-06-22 ENCOUNTER — Inpatient Hospital Stay: Payer: Medicare HMO

## 2023-06-22 DIAGNOSIS — D518 Other vitamin B12 deficiency anemias: Secondary | ICD-10-CM | POA: Diagnosis present

## 2023-06-22 DIAGNOSIS — D509 Iron deficiency anemia, unspecified: Secondary | ICD-10-CM

## 2023-06-22 MED ORDER — CYANOCOBALAMIN 1000 MCG/ML IJ SOLN
1000.0000 ug | Freq: Once | INTRAMUSCULAR | Status: AC
  Administered 2023-06-22: 1000 ug via SUBCUTANEOUS
  Filled 2023-06-22: qty 1

## 2023-06-22 NOTE — Discharge Instructions (Signed)

## 2023-06-23 ENCOUNTER — Ambulatory Visit
Admission: RE | Admit: 2023-06-23 | Discharge: 2023-06-23 | Disposition: A | Discharge status: Home / Self Care | Payer: Medicare HMO | Source: Ambulatory Visit | Attending: Orthopedic Surgery

## 2023-06-23 ENCOUNTER — Ambulatory Visit
Admission: RE | Admit: 2023-06-23 | Discharge: 2023-06-23 | Disposition: A | Discharge status: Home / Self Care | Payer: Medicare HMO | Source: Ambulatory Visit | Attending: Orthopedic Surgery | Admitting: Orthopedic Surgery

## 2023-06-23 DIAGNOSIS — M5416 Radiculopathy, lumbar region: Secondary | ICD-10-CM

## 2023-06-23 MED ORDER — IOPAMIDOL (ISOVUE-M 200) INJECTION 41%
18.0000 mL | Freq: Once | INTRAMUSCULAR | Status: AC
  Administered 2023-06-23: 18 mL via INTRATHECAL

## 2023-06-23 MED ORDER — DIAZEPAM 5 MG PO TABS: 5.0000 mg | ORAL_TABLET | Freq: Once | ORAL | Status: DC

## 2023-06-23 MED ORDER — MEPERIDINE HCL 50 MG/ML IJ SOLN: 50.0000 mg | Freq: Once | INTRAMUSCULAR | Status: DC | PRN

## 2023-06-29 ENCOUNTER — Encounter: Payer: Self-pay | Admitting: Podiatry

## 2023-06-29 ENCOUNTER — Ambulatory Visit (INDEPENDENT_AMBULATORY_CARE_PROVIDER_SITE_OTHER): Payer: Medicare HMO | Admitting: Podiatry

## 2023-06-29 DIAGNOSIS — D689 Coagulation defect, unspecified: Secondary | ICD-10-CM | POA: Diagnosis not present

## 2023-06-29 DIAGNOSIS — B351 Tinea unguium: Secondary | ICD-10-CM

## 2023-06-29 DIAGNOSIS — E1169 Type 2 diabetes mellitus with other specified complication: Secondary | ICD-10-CM | POA: Diagnosis not present

## 2023-06-29 DIAGNOSIS — I739 Peripheral vascular disease, unspecified: Secondary | ICD-10-CM

## 2023-06-29 DIAGNOSIS — E1151 Type 2 diabetes mellitus with diabetic peripheral angiopathy without gangrene: Secondary | ICD-10-CM

## 2023-06-29 DIAGNOSIS — E1351 Other specified diabetes mellitus with diabetic peripheral angiopathy without gangrene: Secondary | ICD-10-CM

## 2023-06-29 NOTE — Progress Notes (Signed)
This patient returns to my office for at risk foot care.  This patient requires this care by a professional since this patient will be at risk due to having diabetes PVD and thrombocytopenia.  patient is unable to cut nails himself since the patient cannot reach his nails.These nails are painful walking and wearing shoes.  This patient presents for at risk foot care today.  General Appearance  Alert, conversant and in no acute stress.  Vascular  Dorsalis pedis and posterior tibial  pulses are  weakly palpable  bilaterally.  Capillary return is within normal limits  bilaterally. Cold feet. bilaterally.  Neurologic  Senn-Weinstein monofilament wire test within normal limits  bilaterally. Muscle power within normal limits bilaterally.  Nails Thick disfigured discolored nails with subungual debris  from hallux to fifth toes bilaterally. No evidence of bacterial infection or drainage bilaterally.  Orthopedic  No limitations of motion  feet .  No crepitus or effusions noted.  No bony pathology or digital deformities noted.  Skin  normotropic skin with no porokeratosis noted bilaterally.  No signs of infections or ulcers noted.     Onychomycosis  Pain in right toes  Pain in left toes  Consent was obtained for treatment procedures.   Mechanical debridement of nails 1-5  bilaterally performed with a nail nipper.  Filed with dremel without incident.    Return office visit   3 months                   Told patient to return for periodic foot care and evaluation due to potential at risk complications.   Joah Patlan DPM   

## 2023-07-01 ENCOUNTER — Ambulatory Visit: Payer: Medicare HMO | Admitting: Podiatry

## 2023-07-08 ENCOUNTER — Other Ambulatory Visit (INDEPENDENT_AMBULATORY_CARE_PROVIDER_SITE_OTHER): Payer: Medicare HMO

## 2023-07-08 ENCOUNTER — Ambulatory Visit: Payer: Medicare HMO | Admitting: Orthopedic Surgery

## 2023-07-08 ENCOUNTER — Ambulatory Visit (INDEPENDENT_AMBULATORY_CARE_PROVIDER_SITE_OTHER): Payer: Medicare HMO

## 2023-07-08 ENCOUNTER — Other Ambulatory Visit: Payer: Self-pay | Admitting: Internal Medicine

## 2023-07-08 DIAGNOSIS — I428 Other cardiomyopathies: Secondary | ICD-10-CM | POA: Diagnosis not present

## 2023-07-08 DIAGNOSIS — M542 Cervicalgia: Secondary | ICD-10-CM | POA: Diagnosis not present

## 2023-07-08 DIAGNOSIS — M5416 Radiculopathy, lumbar region: Secondary | ICD-10-CM

## 2023-07-08 DIAGNOSIS — I480 Paroxysmal atrial fibrillation: Secondary | ICD-10-CM

## 2023-07-08 LAB — CUP PACEART REMOTE DEVICE CHECK
Battery Remaining Longevity: 7 mo
Battery Remaining Percentage: 9 %
Battery Voltage: 2.66 V
Brady Statistic AP VP Percent: 1.3 %
Brady Statistic AP VS Percent: 63 %
Brady Statistic AS VP Percent: 1.5 %
Brady Statistic AS VS Percent: 34 %
Brady Statistic RA Percent Paced: 63 %
Brady Statistic RV Percent Paced: 3.2 %
Date Time Interrogation Session: 20241009073110
HighPow Impedance: 62 Ohm
HighPow Impedance: 62 Ohm
Implantable Lead Connection Status: 753985
Implantable Lead Connection Status: 753985
Implantable Lead Implant Date: 20090708
Implantable Lead Implant Date: 20090708
Implantable Lead Location: 753859
Implantable Lead Location: 753860
Implantable Lead Model: 7122
Implantable Pulse Generator Implant Date: 20170106
Lead Channel Impedance Value: 330 Ohm
Lead Channel Impedance Value: 350 Ohm
Lead Channel Pacing Threshold Amplitude: 0.5 V
Lead Channel Pacing Threshold Amplitude: 1.25 V
Lead Channel Pacing Threshold Pulse Width: 0.5 ms
Lead Channel Pacing Threshold Pulse Width: 0.9 ms
Lead Channel Sensing Intrinsic Amplitude: 1.3 mV
Lead Channel Sensing Intrinsic Amplitude: 8.1 mV
Lead Channel Setting Pacing Amplitude: 2 V
Lead Channel Setting Pacing Amplitude: 2.5 V
Lead Channel Setting Pacing Pulse Width: 0.9 ms
Lead Channel Setting Sensing Sensitivity: 0.5 mV
Pulse Gen Serial Number: 7306179

## 2023-07-08 NOTE — Progress Notes (Signed)
Orthopedic Spine Surgery Office Note   Assessment: Patient is a 77 y.o. male with low back pain that radiates into the right lateral thigh. 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  Also, complaining of neck pain and has positive cervical sagittal balance     Plan: -Patient has tried advil, tylenol, percocet (pain management), Lyrica -He has not had any relief with his current treatments, so recommended a diagnostic/therapeutic injection.  Referral provided to Dr. Bridgeton Blas office -In regards to his neck, he has neck pain with no radicular symptoms or concerns for myelopathy.  He does have positive sagittal cervical balance.  I explained to him that surgery could be done for this.  It would be a deformity correction surgery with an anterior and posterior incision to correct his alignment.  I told him that this would be a long surgery with a 30% chance of major complication.  After discussion, patient did not want to proceed with surgery.  I told him that he could go to physical therapy or I could refer him to pain management.  He did not want to pursue either of those options at this time. -Patient should return to office in 4-5 weeks, x-rays at next visit: none     Patient expressed understanding of the plan and all questions were answered to the patient's satisfaction.    ___________________________________________________________________________     History:   Patient is a 77 y.o. male who presents today for follow up on his lumbar spine.  Patient is still having low back pain that radiates into the right lower extremity.  He feels it along the lateral aspect of the thigh.  It does not radiate past the knee.  He has not noticed any changes in his symptoms since the last time he was seen.  He still not having any pain radiating into the left lower extremity.  He also wanted to talk about his neck today.  He has had neck pain for the last year.  It has gotten progressively worse with time.  He  feels it at the base of his neck.  He notices worse as the day goes on.  He does not have as much neck pain if he is laying down or sitting in a recliner.  He has no pain radiating into either upper extremity.  He has not noticed any unsteadiness with gait.  He has not had any issues with fine motor skills in the hands.     Treatments tried: advil, tylenol, percocet (pain management), Lyrica     Physical Exam:   General: no acute distress, appears stated age Neurologic: alert, answering questions appropriately, following commands Respiratory: unlabored breathing on room air, symmetric chest rise Psychiatric: appropriate affect, normal cadence to speech     MSK (spine):   -Strength exam                                                   Left                  Right EHL                              4/5                  4/5 TA  5/5                  5/5 GSC                             5/5                  5/5 Knee extension            5/5                  5/5 Hip flexion                    5/5                  4/5  5 out of 5 strength in all upper extremity myotomes  -Sensory exam               Sensation intact to light touch in C5-T1 nerve distributions of bilateral upper extremities             Sensation intact to light touch in L3-S1 nerve distributions of bilateral lower extremities (decreased throughout right lower extremity due to stroke)   -Achilles DTR: 1/4 on the left, 1/4 on the right -Patellar tendon DTR: 1/4 on the left, 1/4 on the right -Negative Hoffmann bilaterally -No beats of clonus bilaterally -Negative grip and release test -No interosseous muscle wasting seen    Imaging: XR of the lumbar spine from 05/29/2023 was previously independently reviewed and interpreted, showing disc height loss at L4/5 and L5/S1. There is a vacuum disc phenomenon at L4/5. Lumbar degenerative scoliosis with apex to the left at L2/3. No evidence of  instability on flexion/extension views. No fracture or dislocation seen. PI of 39, LL of 32.    CT myelogram from 06/23/2023 was previously independently reviewed and interpreted, showing bilateral foraminal stenosis at L4/5 and L5/S1. Vacuum disc phenomenon at both of those levels as well. No other significant stenosis seen. No fracture seen.   XR of the cervical spine from 07/08/2023 was independently reviewed and interpreted, showing positive cervical sagittal imbalance, measuring 7.8cm.  Spondylolisthesis seen at C2/3, C3/4, C4/5.  No spondylolisthesis shifts more than 2 mm.  No fracture or dislocation seen.  Disc height loss with anterior osteophyte formation noted at C5/6.   Patient name: [name] Patient MRN: [id number] Date of visit: 07/08/23

## 2023-07-08 NOTE — Telephone Encounter (Signed)
Prescription refill request for Xarelto received.  Indication: AF Last office visit: 07/01/22  Rosette Reveal MD (appt 07/09/23)  Weight: 99.3kg Age: 77 Scr: 1.19 on 04/24/23  Epic CrCl: 75.33  Based on above findings Xarelto 20mg  daily is the appropriate dose.  Refill approved.

## 2023-07-09 ENCOUNTER — Ambulatory Visit: Payer: Medicare HMO | Attending: Internal Medicine | Admitting: Internal Medicine

## 2023-07-09 ENCOUNTER — Encounter: Payer: Self-pay | Admitting: Internal Medicine

## 2023-07-09 VITALS — BP 152/78 | HR 60 | Ht 74.0 in | Wt 211.0 lb

## 2023-07-09 DIAGNOSIS — I480 Paroxysmal atrial fibrillation: Secondary | ICD-10-CM | POA: Diagnosis not present

## 2023-07-09 LAB — CUP PACEART INCLINIC DEVICE CHECK
Battery Remaining Longevity: 9 mo
Brady Statistic RA Percent Paced: 63 %
Brady Statistic RV Percent Paced: 3.1 %
Date Time Interrogation Session: 20241010135018
HighPow Impedance: 61.875
Implantable Lead Connection Status: 753985
Implantable Lead Connection Status: 753985
Implantable Lead Implant Date: 20090708
Implantable Lead Implant Date: 20090708
Implantable Lead Location: 753859
Implantable Lead Location: 753860
Implantable Lead Model: 7122
Implantable Pulse Generator Implant Date: 20170106
Lead Channel Impedance Value: 337.5 Ohm
Lead Channel Impedance Value: 350 Ohm
Lead Channel Pacing Threshold Amplitude: 0.5 V
Lead Channel Pacing Threshold Amplitude: 0.5 V
Lead Channel Pacing Threshold Amplitude: 1.25 V
Lead Channel Pacing Threshold Amplitude: 1.25 V
Lead Channel Pacing Threshold Pulse Width: 0.5 ms
Lead Channel Pacing Threshold Pulse Width: 0.5 ms
Lead Channel Pacing Threshold Pulse Width: 0.9 ms
Lead Channel Pacing Threshold Pulse Width: 0.9 ms
Lead Channel Sensing Intrinsic Amplitude: 1 mV
Lead Channel Sensing Intrinsic Amplitude: 8.9 mV
Lead Channel Setting Pacing Amplitude: 2 V
Lead Channel Setting Pacing Amplitude: 2.5 V
Lead Channel Setting Pacing Pulse Width: 0.9 ms
Lead Channel Setting Sensing Sensitivity: 0.5 mV
Pulse Gen Serial Number: 7306179

## 2023-07-09 NOTE — Patient Instructions (Signed)
Medication Instructions:  Your physician recommends that you continue on your current medications as directed. Please refer to the Current Medication list given to you today.  *If you need a refill on your cardiac medications before your next appointment, please call your pharmacy*   Lab Work: None If you have labs (blood work) drawn today and your tests are completely normal, you will receive your results only by: MyChart Message (if you have MyChart) OR A paper copy in the mail If you have any lab test that is abnormal or we need to change your treatment, we will call you to review the results.   Testing/Procedures: None   Follow-Up: At [facility], you and your health needs are our priority.  As part of our continuing mission to provide you with exceptional heart care, we have created designated Provider Care Teams.  These Care Teams include your primary Cardiologist (physician) and Advanced Practice Providers (APPs -  Physician Assistants and Nurse Practitioners) who all work together to provide you with the care you need, when you need it.  We recommend signing up for the patient portal called "MyChart".  Sign up information is provided on this After Visit Summary.  MyChart is used to connect with patients for Virtual Visits (Telemedicine).  Patients are able to view lab/test results, encounter notes, upcoming appointments, etc.  Non-urgent messages can be sent to your provider as well.   To learn more about what you can do with MyChart, go to [email or web address].    Your next appointment:   11 month(s)  Provider:   Lewayne Bunting, MD    Other Instructions

## 2023-07-09 NOTE — Progress Notes (Signed)
HPI [name] returns today for followup. He is a pleasant 77 yo man with a h/o chronic systolic heart failure, poorly controlled atrial fib, persistent atrial fib, h/o GI bleeding on Xarelto, and s/p ICD. I saw him 12 months ago. His Xarelto was restarted. He denies chest pain. He has mild peripheral edema. He was noted to have some atrial undersensing on remote monitoring. Since I saw him last, he notes some back and neck pain. He had some atrial undersensing on his remote monitor with PAC's in the PVARP.  Allergies  Allergen Reactions   Codeine Nausea And Vomiting     Current Outpatient Medications  Medication Sig Dispense Refill   acetaminophen (TYLENOL) 500 MG tablet Take 1 tablet (500 mg total) by mouth every 6 (six) hours as needed. 30 tablet 0   atorvastatin (LIPITOR) 40 MG tablet Take 40 mg by mouth daily.     baclofen (LIORESAL) 20 MG tablet Take 40 mg by mouth at bedtime.      carvedilol (COREG) 25 MG tablet Take 1 tablet (25 mg total) by mouth 2 (two) times daily with a meal. 180 tablet 3   cyclobenzaprine (FLEXERIL) 10 MG tablet Take 1 tablet (10 mg total) by mouth 2 (two) times daily as needed for muscle spasms. 20 tablet 0   DULoxetine (CYMBALTA) 30 MG capsule Take 30 mg by mouth daily.     furosemide (LASIX) 20 MG tablet Take 1 tablet (20 mg total) by mouth 2 (two) times daily. 30 tablet    glimepiride (AMARYL) 4 MG tablet Take 1 tablet (4 mg total) by mouth daily with breakfast.     linagliptin (TRADJENTA) 5 MG TABS tablet Take 5 mg by mouth daily after breakfast.      meclizine (ANTIVERT) 25 MG tablet Take 25 mg by mouth 4 (four) times daily as needed. For dizziness     metFORMIN (GLUCOPHAGE) 500 MG tablet Take 500 mg by mouth daily with breakfast.     oxyCODONE-acetaminophen (PERCOCET/ROXICET) 5-325 MG tablet Take a half tablet every 6 hours     pregabalin (LYRICA) 75 MG capsule Take 1 capsule (75 mg total) by mouth 2 (two) times daily. 60 capsule 0   rivaroxaban  (XARELTO) 20 MG TABS tablet TAKE 1 TABLET BY MOUTH ONCE DAILY WITH SUPPER 90 tablet 1   SOLIQUA 100-33 UNT-MCG/ML SOPN Inject 45 Units into the skin daily.     tamsulosin (FLOMAX) 0.4 MG CAPS capsule Take 0.4 mg by mouth daily.     triamcinolone cream (KENALOG) 0.5 % triamcinolone acetonide 0.5 % topical cream  APPLY CREAM TOPICALLY TO AFFECTED AREA TWICE DAILY     No current facility-administered medications for this visit.     Past Medical History:  Diagnosis Date   Anemia    Multifactorial  - hx of B12 def and iron deficiency, followed by heme, followed at [facility]   Automatic implantable cardioverter-defibrillator in situ    CAD (coronary artery disease)    a. STEMI s/p CABG 07/2007 (had preop cardiogenic shock, IABP, VDRF before surgery)   Cardiomyopathy, ischemic    a. Chronic systolic CHF s/p St. Jude dual chamber ICD 03/2008.   Carotid stenosis    a. Prior carotid dz/ surgery. b. Carotid dopp 07/2011 - 0-39% bilaterally.;   c. Doppler 11/13: 40-59% RICA, 0-39% LICA   Chronic renal insufficiency    Diabetes mellitus    GI bleed    Reported history   Hx of CABG  Hypertension    [place] cardiac care    Mitral regurgitation    Myocardial infarction [facility])    Neuromuscular disorder (HCC)    R sided weakness    Osteoarthritis    PAF (paroxysmal atrial fibrillation) (HCC)    a. Poor Coumadin candidate due to hx of GI bleed.   Sinus bradycardia    Sleep apnea    study done, 10 yrs ago, unable to tolerate CPAP   Splenomegaly    Stroke [facility])    a. CVA 1990s - chronic pain in RUE after stroke.   Systolic CHF, chronic (HCC)    Previously taken off lisinopril by primary doctor due to labs   Tobacco abuse     ROS:   All systems reviewed and negative except as noted in the HPI.   Past Surgical History:  Procedure Laterality Date   CARDIAC DEFIBRILLATOR PLACEMENT  04/05/2008   Implantation of a St. Jude dual-chamber defibrillator, Doylene Canning. [name] , MD    CARDIAC DEFIBRILLATOR PLACEMENT     CHOLECYSTECTOMY N/A 02/19/2016   Procedure: LAPAROSCOPIC CHOLECYSTECTOMY;  Surgeon: [name] Miyamoto, MD;  Location: [facility] OR;  Service: General;  Laterality: N/A;   COLONOSCOPY  07/2007   Rourk: left-sided diverticulum. TI normal. No etiology for IDA.   CORONARY ARTERY BYPASS GRAFT     CORONARY ARTERY BYPASS GRAFT  08/29/2007   x3; [name] Favre H. Cornelius Moras MD   EP IMPLANTABLE DEVICE N/A 10/05/2015   Procedure: ICD Generator Changeout;  Surgeon: Marinus Maw, MD;  Location: [facility] INVASIVE CV LAB;  Service: Cardiovascular;  Laterality: N/A;   ERCP N/A 02/18/2016   Procedure: ENDOSCOPIC RETROGRADE CHOLANGIOPANCREATOGRAPHY (ERCP);  Surgeon: [name] Rigger, MD;  Location: [facility] ENDOSCOPY;  Service: Endoscopy;  Laterality: N/A;   ESOPHAGOGASTRODUODENOSCOPY  07/2007   Rourk: small hh, atrophic gastric mucosa, poor distensibility of stomach ?extrinsic compression, CT showed mild to moderate splenomegaly. No etiology for IDA.   JOINT REPLACEMENT  1990's   L knee   LAPAROSCOPIC APPENDECTOMY N/A 09/14/2017   Procedure: APPENDECTOMY LAPAROSCOPIC;  Surgeon: [name] Norman, MD;  Location: [facility] OR;  Service: General;  Laterality: N/A;   Percutaneous coronary intervention  01/10/2005   [name] Constable, MD   TONSILLECTOMY     TOTAL KNEE ARTHROPLASTY Right 08/16/2013   Procedure: RIGHT TOTAL KNEE ARTHROPLASTY;  Surgeon: [name] Hitch, MD;  Location: [facility] OR;  Service: Orthopedics;  Laterality: Right;     Family History  Problem Relation Age of Onset   Heart attack Mother        CVA, MI   Diabetes Mother    Heart attack Father        MI   Diabetes Sister    Coronary artery disease Brother        CABG     Social History   Socioeconomic History   Marital status: Unknown    Spouse name: Not on file   Number of children: Not on file   Years of education: Not on file   Highest education level: Not on file  Occupational History   Occupation: Retired    Associate Professor: UNEMPLOYED   Tobacco Use   Smoking status: Former    Current packs/day: 0.00    Average packs/day: 1 pack/day for 15.0 years (15.0 ttl pk-yrs)    Types: Cigarettes    Start date: 09/30/1975    Quit date: 09/29/1990    Years since quitting: 32.7   Smokeless tobacco: Former    Quit date: 08/09/1988  Vaping Use  Vaping status: Never Used  Substance and Sexual Activity   Alcohol use: No   Drug use: No   Sexual activity: Not Currently  Other Topics Concern   Not on file  Social History Narrative   ** Merged History Encounter **       Lives in [place] with wife   Been on disability since his stroke in the 1990s   Not routinely exercising   Social Determinants of Health   Financial Resource Strain: Not on file  Food Insecurity: Not on file  Transportation Needs: Not on file  Physical Activity: Not on file  Stress: Not on file  Social Connections: Not on file  Intimate Partner Violence: Not on file     Ht 6\' 2"  (1.88 m)   Wt 211 lb (95.7 kg)   BMI 27.09 kg/m   Physical Exam:  Well appearing NAD HEENT: Unremarkable Neck:  No JVD, no thyromegally Lymphatics:  No adenopathy Back:  No CVA tenderness Lungs:  Clear with no wheezes CV: reg rate rhythm, no murmurs, no rubs, no clicks Abd:  soft, positive bowel sounds, no organomegally, no rebound, no guarding Ext:  2 plus pulses, no edema, no cyanosis, no clubbing Skin:  No rashes no nodules Neuro:  CN II through XII intact, motor grossly intact  DEVICE  Normal device function.  See PaceArt for details.   Assess/Plan:  PAF - he is back in NSR. We reprogrammed his device to allow for atrial pacing and intrinsic conduction. Atrial undersensing - his P waves are 1 and sensitivity is set at 0.5. we reduce his PVARP as he did not have much VA conduction.  Chronic systolic heart failure - his symptoms have been class 2 with reprogramming of his device from VVI to DDD with a long AV delay. ICD - as above. His device is working  normally. Coags - he will continue xarelto.   Sharlot Gowda Caela Huot,MD

## 2023-07-15 ENCOUNTER — Ambulatory Visit: Payer: Medicare HMO | Admitting: Physical Medicine and Rehabilitation

## 2023-07-15 ENCOUNTER — Other Ambulatory Visit: Payer: Self-pay

## 2023-07-15 DIAGNOSIS — M5416 Radiculopathy, lumbar region: Secondary | ICD-10-CM | POA: Diagnosis not present

## 2023-07-15 MED ORDER — METHYLPREDNISOLONE ACETATE 40 MG/ML IJ SUSP
40.0000 mg | Freq: Once | INTRAMUSCULAR | Status: AC
Start: 2023-07-15 — End: 2023-07-15
  Administered 2023-07-15: 40 mg

## 2023-07-15 NOTE — Patient Instructions (Signed)

## 2023-07-15 NOTE — Progress Notes (Signed)
Functional Pain Scale - descriptive words and definitions  Immobilizing (10)   Unable to move or talk due to intensity of pain/unable to sleep and unable to use distraction. Severe range order  Average Pain 10   +Driver, -BT, -Dye Allergies. Yes, yes, no  181/87 77 96%

## 2023-07-24 ENCOUNTER — Inpatient Hospital Stay: Payer: Medicare HMO | Attending: Oncology

## 2023-07-24 ENCOUNTER — Other Ambulatory Visit: Payer: Self-pay

## 2023-07-24 ENCOUNTER — Inpatient Hospital Stay: Payer: Medicare HMO

## 2023-07-24 VITALS — BP 153/75 | HR 62 | Temp 97.8°F | Resp 18

## 2023-07-24 DIAGNOSIS — D518 Other vitamin B12 deficiency anemias: Secondary | ICD-10-CM | POA: Diagnosis present

## 2023-07-24 DIAGNOSIS — D509 Iron deficiency anemia, unspecified: Secondary | ICD-10-CM

## 2023-07-24 MED ORDER — CYANOCOBALAMIN 1000 MCG/ML IJ SOLN
1000.0000 ug | Freq: Once | INTRAMUSCULAR | Status: AC
  Administered 2023-07-24: 1000 ug via SUBCUTANEOUS
  Filled 2023-07-24: qty 1

## 2023-07-27 NOTE — Progress Notes (Signed)
Remote ICD transmission.   

## 2023-07-28 ENCOUNTER — Ambulatory Visit: Payer: Medicare HMO | Admitting: Podiatry

## 2023-07-28 DIAGNOSIS — I739 Peripheral vascular disease, unspecified: Secondary | ICD-10-CM

## 2023-07-28 DIAGNOSIS — E1351 Other specified diabetes mellitus with diabetic peripheral angiopathy without gangrene: Secondary | ICD-10-CM | POA: Diagnosis not present

## 2023-07-28 DIAGNOSIS — M2010 Hallux valgus (acquired), unspecified foot: Secondary | ICD-10-CM | POA: Insufficient documentation

## 2023-07-28 NOTE — Progress Notes (Signed)
[employer] [employer] - 77 y.o. male MRN [id number]  Date of birth: [birth year]  Office Visit Note: Visit Date: 07/15/2023 PCP: Jason Coop, FNP Referred by: London Sheer, MD  Subjective: No chief complaint on file.  HPI:  DYWAYNE GAMEROS is a 77 y.o. male who comes in today at the request of Dr. Willia Craze for planned Right L5-S1 Lumbar Transforaminal epidural steroid injection with fluoroscopic guidance.  The patient has failed conservative care including home exercise, medications, time and activity modification.  This injection will be diagnostic and hopefully therapeutic.  Please see requesting physician notes for further details and justification.   ROS Otherwise per HPI.  Assessment & Plan: Visit Diagnoses:    ICD-10-CM   1. Lumbar radiculopathy  M54.16 XR C-ARM NO REPORT    Epidural Steroid injection    methylPREDNISolone acetate (DEPO-MEDROL) injection 40 mg      Plan: No additional findings.   Meds & Orders:  Meds ordered this encounter  Medications   methylPREDNISolone acetate (DEPO-MEDROL) injection 40 mg    Orders Placed This Encounter  Procedures   XR C-ARM NO REPORT   Epidural Steroid injection    Follow-up: Return for visit to requesting provider as needed.   Procedures: No procedures performed  Lumbosacral Transforaminal Epidural Steroid Injection - Sub-Pedicular Approach with Fluoroscopic Guidance  Patient: THIELEN SHELHAMER      Date of Birth: June 02, 1946 MRN: [id number] PCP: Jason Coop, FNP      Visit Date: 07/15/2023   Universal Protocol:    Date/Time: 07/15/2023  Consent Given By: the patient  Position: PRONE  Additional Comments: Vital signs were monitored before and after the procedure. Patient was prepped and draped in the usual sterile fashion. The correct patient, procedure, and site was verified.   Injection Procedure Details:   Procedure diagnoses: Lumbar radiculopathy [M54.16]    Meds  Administered:  Meds ordered this encounter  Medications   methylPREDNISolone acetate (DEPO-MEDROL) injection 40 mg    Laterality: Right  Location/Site: L5  Needle:5.0 in., 22 ga.  Short bevel or Quincke spinal needle  Needle Placement: Transforaminal  Findings:    -Comments: Excellent flow of contrast along the nerve, nerve root and into the epidural space.  Procedure Details: After squaring off the end-plates to get a true AP view, the C-arm was positioned so that an oblique view of the foramen as noted above was visualized. The target area is just inferior to the "nose of the scotty dog" or sub pedicular. The soft tissues overlying this structure were infiltrated with 2-3 ml. of 1% Lidocaine without Epinephrine.  The spinal needle was inserted toward the target using a "trajectory" view along the fluoroscope beam.  Under AP and lateral visualization, the needle was advanced so it did not puncture dura and was located close the 6 O'Clock position of the pedical in AP tracterory. Biplanar projections were used to confirm position. Aspiration was confirmed to be negative for CSF and/or blood. A 1-2 ml. volume of Isovue-250 was injected and flow of contrast was noted at each level. Radiographs were obtained for documentation purposes.   After attaining the desired flow of contrast documented above, a 0.5 to 1.0 ml test dose of 0.25% Marcaine was injected into each respective transforaminal space.  The patient was observed for 90 seconds post injection.  After no sensory deficits were reported, and normal lower extremity motor function was noted,   the above injectate was administered so that equal amounts  of the injectate were placed at each foramen (level) into the transforaminal epidural space.   Additional Comments:  The patient tolerated the procedure well Dressing: 2 x 2 sterile gauze and Band-Aid    Post-procedure details: Patient was observed during the procedure. Post-procedure  instructions were reviewed.  Patient left the clinic in stable condition.    Clinical History: CT LUMBAR MYELOGRAM FINDINGS:   T11-12: Normal interspace.   T12-L1: Bulging of the disc, asymmetrically more prominent in the left extraforaminal region. No central canal stenosis. Some potential the exiting left T12 nerve could be affected.   L1-2: Moderate bulging of the disc. Mild narrowing of the lateral recesses but without definite neural compression.   L2-3: Moderate bulging of the disc. Mild facet and ligamentous hypertrophy. 2 mm degenerative anterolisthesis. Mild narrowing of the lateral recesses but without visible neural compression.   L3-4: Mild bulging of the disc. No compressive canal or foraminal narrowing.   L4-5: Chronic disc degeneration with loss of disc height and vacuum phenomenon. Endplate osteophytes and mild bulging of the disc. No compressive canal stenosis. Bilateral foraminal stenosis could compress either or both exiting L4 nerves.   L5-S1: Chronic disc degeneration with loss of disc height and vacuum phenomenon. Endplate osteophytes. No central canal stenosis. Bilateral foraminal narrowing that could affect either exiting L5 nerve.   Bilateral sacroiliac osteoarthritis is present, left worse than right.   IMPRESSION: 1. Mild scoliotic curvature convex to the left. 2 mm degenerative anterolisthesis at L2-3. No abnormal motion or listhesis on flexion extension. 2. T12-L1: Left extraforaminal disc protrusion which could possibly affect the left T12 nerve. 3. L2-3: Disc bulge. Mild facet and ligamentous hypertrophy. 2 mm degenerative anterolisthesis. Mild narrowing of the lateral recesses but without visible neural compression. 4. L4-5: Chronic disc degeneration with loss of disc height and vacuum phenomenon. Endplate osteophytes and bulging of the disc. No compressive canal stenosis. Bilateral foraminal stenosis could compress either or both  exiting L4 nerves. 5. L5-S1: Chronic disc degeneration with loss of disc height and vacuum phenomenon. Endplate osteophytes. No central canal stenosis. Bilateral foraminal stenosis that could affect either exiting L5 nerve. 6. Bilateral sacroiliac osteoarthritis, left worse than right.     Electronically Signed   By: Paulina Fusi M.D.   On: 06/23/2023 10:52     Objective:  VS:  HT:    WT:   BMI:     BP:   HR: bpm  TEMP: ( )  RESP:  Physical Exam Vitals and nursing note reviewed.  Constitutional:      General: He is not in acute distress.    Appearance: Normal appearance. He is not ill-appearing.  HENT:     Head: Normocephalic and atraumatic.     Right Ear: External ear normal.     Left Ear: External ear normal.     Nose: No congestion.  Eyes:     Extraocular Movements: Extraocular movements intact.  Cardiovascular:     Rate and Rhythm: Normal rate.     Pulses: Normal pulses.  Pulmonary:     Effort: Pulmonary effort is normal. No respiratory distress.  Abdominal:     General: There is no distension.     Palpations: Abdomen is soft.  Musculoskeletal:        General: No tenderness or signs of injury.     Cervical back: Neck supple.     Right lower leg: No edema.     Left lower leg: No edema.     Comments:  Patient has good distal strength without clonus.  Skin:    Findings: No erythema or rash.  Neurological:     General: No focal deficit present.     Mental Status: He is alert and oriented to person, place, and time.     Sensory: No sensory deficit.     Motor: No weakness or abnormal muscle tone.     Coordination: Coordination normal.  Psychiatric:        Mood and Affect: Mood normal.        Behavior: Behavior normal.      Imaging: No results found.

## 2023-07-28 NOTE — Progress Notes (Signed)
This patient presents to the office requesting diabetic shoes.  He says he was casted for shoes in 2022 and never received a call that his shoes were here.  General Appearance  Alert, conversant and in no acute stress.  Vascular  Dorsalis pedis and posterior tibial  pulses are  diminished/absent palpable  bilaterally.  Capillary return is within normal limits  bilaterally  Cold feet. bilaterally.  Neurologic  Senn-Weinstein monofilament wire test diminished   bilaterally. Muscle power within normal limits bilaterally.  Nails Thick disfigured discolored nails with subungual debris  from hallux to fifth toes bilaterally. No evidence of bacterial infection or drainage bilaterally.  Orthopedic  No limitations of motion  feet .  No crepitus or effusions noted.  No bony pathology or digital deformities noted.  HAV  B/L.  Skin  normotropic skin with no porokeratosis noted bilaterally.  No signs of infections or ulcers noted.     Diabetes with PVD and neuropathy.  ROV.  Patient qualifies for diabetic shoes due to DPN and HV  B/L.  Patient to make an appointment with pedorthist upon leaving.  Helane Gunther DPM

## 2023-07-28 NOTE — Procedures (Signed)
Lumbosacral Transforaminal Epidural Steroid Injection - Sub-Pedicular Approach with Fluoroscopic Guidance  Patient: [name]      Date of Birth: 1946/07/19 MRN: [id number] PCP: Jason Coop, FNP      Visit Date: 07/15/2023   Universal Protocol:    Date/Time: 07/15/2023  Consent Given By: the patient  Position: PRONE  Additional Comments: Vital signs were monitored before and after the procedure. Patient was prepped and draped in the usual sterile fashion. The correct patient, procedure, and site was verified.   Injection Procedure Details:   Procedure diagnoses: Lumbar radiculopathy [M54.16]    Meds Administered:  Meds ordered this encounter  Medications   methylPREDNISolone acetate (DEPO-MEDROL) injection 40 mg    Laterality: Right  Location/Site: L5  Needle:5.0 in., 22 ga.  Short bevel or Quincke spinal needle  Needle Placement: Transforaminal  Findings:    -Comments: Excellent flow of contrast along the nerve, nerve root and into the epidural space.  Procedure Details: After squaring off the end-plates to get a true AP view, the C-arm was positioned so that an oblique view of the foramen as noted above was visualized. The target area is just inferior to the "nose of the scotty dog" or sub pedicular. The soft tissues overlying this structure were infiltrated with 2-3 ml. of 1% Lidocaine without Epinephrine.  The spinal needle was inserted toward the target using a "trajectory" view along the fluoroscope beam.  Under AP and lateral visualization, the needle was advanced so it did not puncture dura and was located close the 6 O'Clock position of the pedical in AP tracterory. Biplanar projections were used to confirm position. Aspiration was confirmed to be negative for CSF and/or blood. A 1-2 ml. volume of Isovue-250 was injected and flow of contrast was noted at each level. Radiographs were obtained for documentation purposes.   After attaining the  desired flow of contrast documented above, a 0.5 to 1.0 ml test dose of 0.25% Marcaine was injected into each respective transforaminal space.  The patient was observed for 90 seconds post injection.  After no sensory deficits were reported, and normal lower extremity motor function was noted,   the above injectate was administered so that equal amounts of the injectate were placed at each foramen (level) into the transforaminal epidural space.   Additional Comments:  The patient tolerated the procedure well Dressing: 2 x 2 sterile gauze and Band-Aid    Post-procedure details: Patient was observed during the procedure. Post-procedure instructions were reviewed.  Patient left the clinic in stable condition.

## 2023-08-12 ENCOUNTER — Ambulatory Visit: Payer: Medicare HMO | Admitting: Orthopedic Surgery

## 2023-08-12 DIAGNOSIS — M542 Cervicalgia: Secondary | ICD-10-CM | POA: Diagnosis not present

## 2023-08-12 NOTE — Progress Notes (Signed)
Orthopedic Spine Surgery Office Note   Assessment: Patient is a 77 y.o. male with low back pain that radiates into the right lateral thigh. 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  Has previously complained of neck pain and has positive cervical sagittal balance     Plan: -Patient has tried advil, tylenol, percocet (pain management), Lyrica -Patient got significant relief of his radiating leg pain with the injection, so could repeat this if he gets lasting relief with this injection -He has been getting prescriptions for percocet for his pain. He was recently told he needed to go to [facility] to keep getting this prescriptions. He asked if I would prescribe that medication. I told him I prescribe narcotics to control post-operative pain, so I provided him with a referral to pain management -I told him that he could use up to 3000mg  in one day of tylenol not help control his pain as well -Patient should return to office on an as needed basis     Patient expressed understanding of the plan and all questions were answered to the patient's satisfaction.    ___________________________________________________________________________     History:   Patient is a 77 y.o. male who presents today for follow up on his lumbar spine. He reports significant improvement in his radiating leg pain since the injection. He is still having low back pain. He feels some, but more tolerable, pain along the lateral aspect of his thigh to the level of the knee on the right side. He has not developed any new symptoms since he was last seen.    Treatments tried: advil, tylenol, percocet (pain management), Lyrica     Physical Exam:   General: no acute distress, appears stated age Neurologic: alert, answering questions appropriately, following commands Respiratory: unlabored breathing on room air, symmetric chest rise Psychiatric: appropriate affect, normal cadence to speech     MSK (spine):   -Strength exam                                                    Left                  Right EHL                              4/5                  4/5 TA                                 5/5                  5/5 GSC                             5/5                  5/5 Knee extension            5/5                  5/5 Hip flexion  5/5                  4/5     -Sensory exam                         Sensation intact to light touch in L3-S1 nerve distributions of bilateral lower extremities (decreased throughout right lower extremity due to stroke)     Imaging: XR of the lumbar spine from 05/29/2023 was previously independently reviewed and interpreted, showing disc height loss at L4/5 and L5/S1. There is a vacuum disc phenomenon at L4/5. Lumbar degenerative scoliosis with apex to the left at L2/3. No evidence of instability on flexion/extension views. No fracture or dislocation seen. PI of 39, LL of 32.    CT myelogram from 06/23/2023 was previously independently reviewed and interpreted, showing bilateral foraminal stenosis at L4/5 and L5/S1. Vacuum disc phenomenon at both of those levels as well. No other significant stenosis seen. No fracture seen.    XR of the cervical spine from 07/08/2023 was previously independently reviewed and interpreted, showing positive cervical sagittal imbalance, measuring 7.8cm.  Spondylolisthesis seen at C2/3, C3/4, C4/5.  No spondylolisthesis shifts more than 2 mm.  No fracture or dislocation seen.  Disc height loss with anterior osteophyte formation noted at C5/6.     Patient name: [name] Patient MRN: [id number] Date of visit: 08/12/23

## 2023-08-14 ENCOUNTER — Other Ambulatory Visit: Payer: Medicare HMO

## 2023-08-21 ENCOUNTER — Inpatient Hospital Stay: Payer: Medicare HMO | Attending: Oncology

## 2023-08-21 ENCOUNTER — Inpatient Hospital Stay: Payer: Medicare HMO

## 2023-08-21 VITALS — BP 162/82 | HR 66 | Temp 97.6°F | Resp 17

## 2023-08-21 DIAGNOSIS — D518 Other vitamin B12 deficiency anemias: Secondary | ICD-10-CM | POA: Diagnosis present

## 2023-08-21 DIAGNOSIS — D509 Iron deficiency anemia, unspecified: Secondary | ICD-10-CM

## 2023-08-21 MED ORDER — CYANOCOBALAMIN 1000 MCG/ML IJ SOLN
1000.0000 ug | Freq: Once | INTRAMUSCULAR | Status: AC
  Administered 2023-08-21: 1000 ug via SUBCUTANEOUS
  Filled 2023-08-21: qty 1

## 2023-09-02 ENCOUNTER — Telehealth: Payer: Self-pay | Admitting: Podiatry

## 2023-09-02 NOTE — Telephone Encounter (Signed)
LVM for pt to call to schedule appt in January for diabetic shoe measurements.

## 2023-09-18 ENCOUNTER — Inpatient Hospital Stay: Payer: Medicare HMO

## 2023-09-18 ENCOUNTER — Inpatient Hospital Stay: Payer: Medicare HMO | Attending: Hematology

## 2023-09-18 ENCOUNTER — Telehealth: Payer: Self-pay | Admitting: Hematology and Oncology

## 2023-09-18 DIAGNOSIS — D518 Other vitamin B12 deficiency anemias: Secondary | ICD-10-CM | POA: Insufficient documentation

## 2023-09-21 ENCOUNTER — Inpatient Hospital Stay: Payer: Medicare HMO

## 2023-09-21 VITALS — BP 152/76 | HR 82 | Temp 97.6°F | Resp 16

## 2023-09-21 DIAGNOSIS — D518 Other vitamin B12 deficiency anemias: Secondary | ICD-10-CM | POA: Diagnosis present

## 2023-09-21 DIAGNOSIS — D509 Iron deficiency anemia, unspecified: Secondary | ICD-10-CM

## 2023-09-21 MED ORDER — CYANOCOBALAMIN 1000 MCG/ML IJ SOLN
1000.0000 ug | Freq: Once | INTRAMUSCULAR | Status: AC
  Administered 2023-09-21: 1000 ug via SUBCUTANEOUS
  Filled 2023-09-21: qty 1

## 2023-09-23 IMAGING — DX DG LUMBAR SPINE COMPLETE 4+V
5 series · 5 of 5 positions shown · non-contrast
Comparison: Abdominal CT 03/12/2018

CLINICAL DATA: Worsening left lower back pain radiating down the
leg

EXAM:
LUMBAR SPINE - COMPLETE 4 VIEW

[l-spine ap]
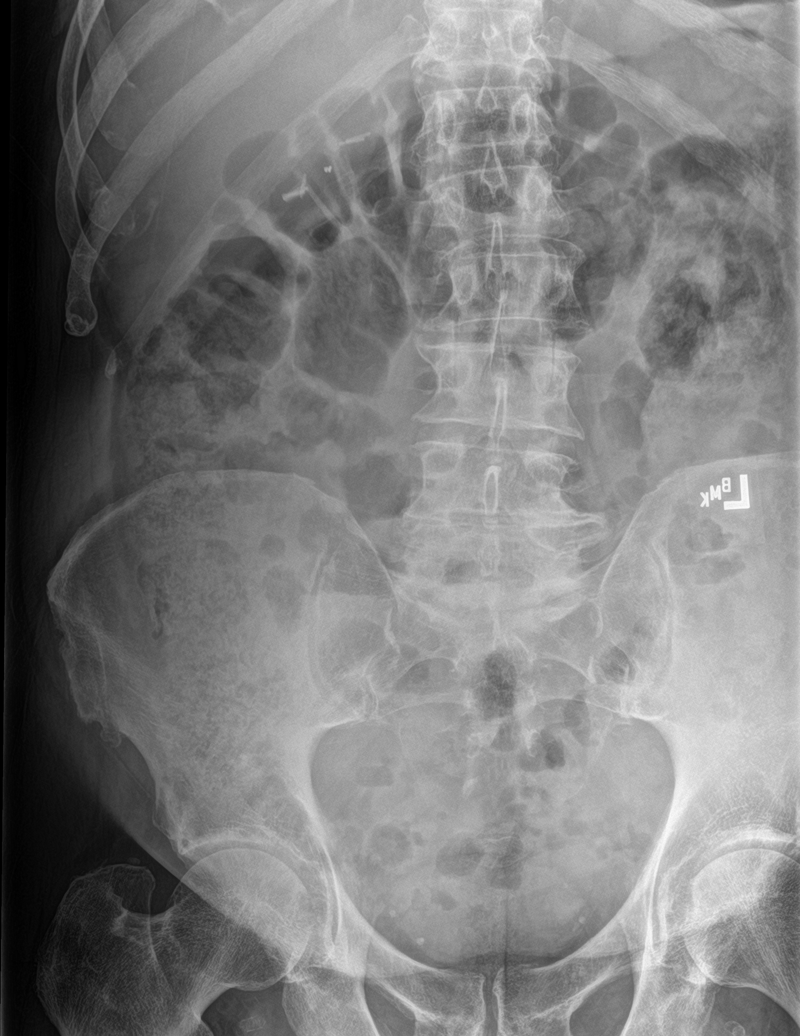

[l-spine obl (1 of 2)]
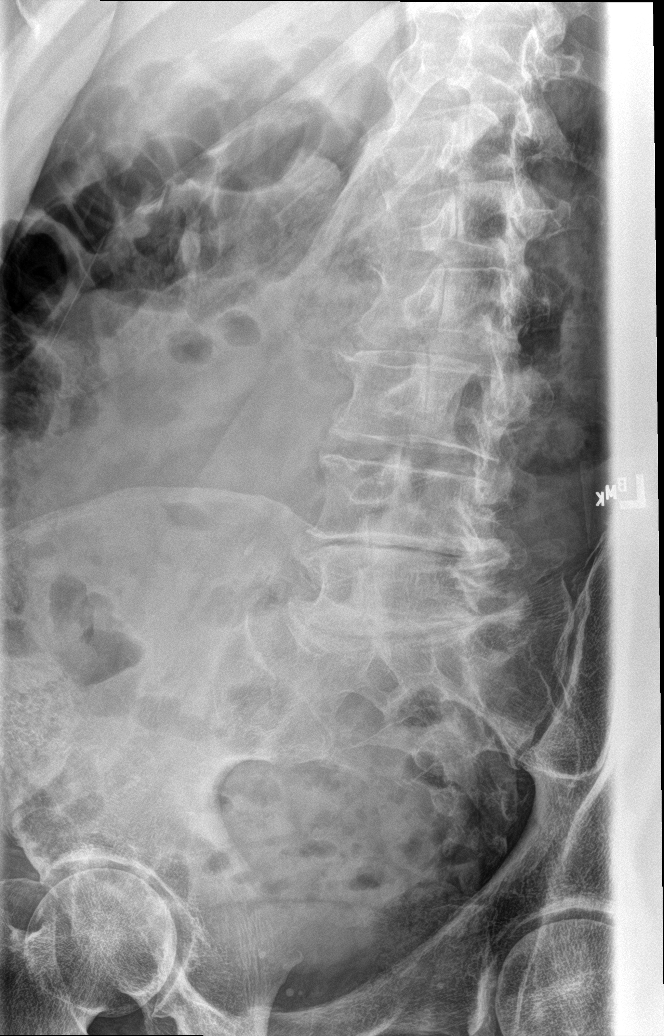

[l-spine obl (2 of 2)]
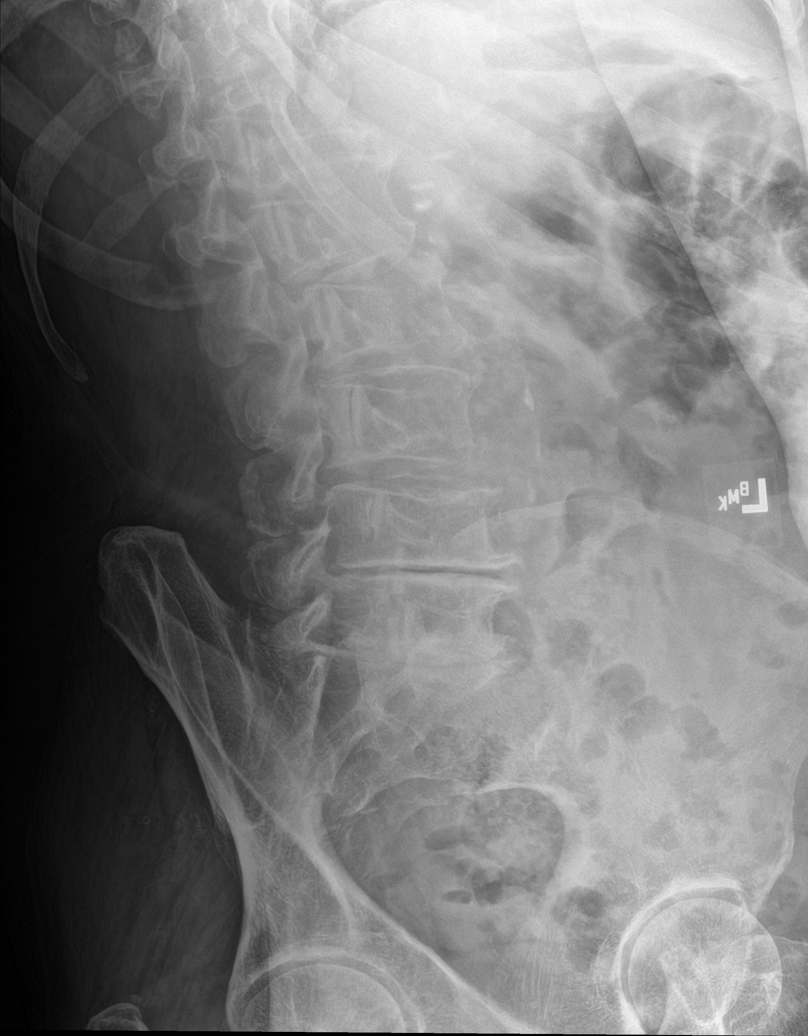

[l-spine lat (1 of 2)]
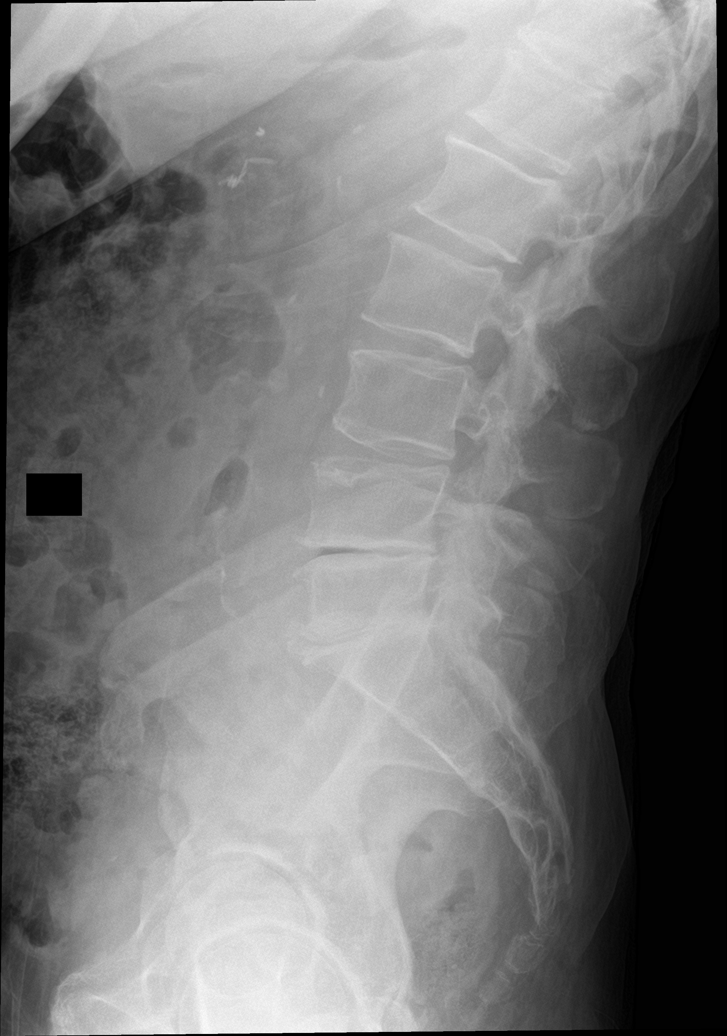

[l-spine lat (2 of 2)]
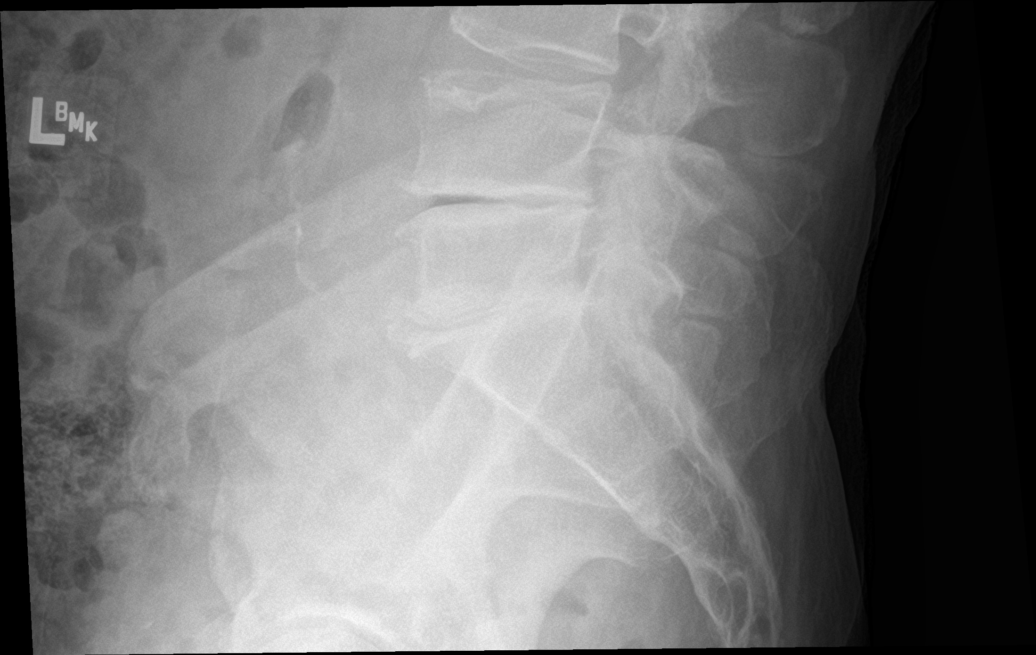

[5 of 5 positions shown; findings below may reference images not displayed]

FINDINGS: Hypoplastic ribs at the level of assumed to be T12. Generalized disc
space narrowing and endplate spurring with mild scoliosis. Discs
appear collapsed at L4-5 and L5-S1. Lower lumbar facet spurring. No
evidence of fracture or bone lesion.
IMPRESSION: Generalized lumbar spine degeneration, especially advanced at L4-5
and L5-S1 disc spaces, with scoliosis. No acute finding.

## 2023-09-28 ENCOUNTER — Encounter: Payer: Self-pay | Admitting: Podiatry

## 2023-09-28 ENCOUNTER — Ambulatory Visit (INDEPENDENT_AMBULATORY_CARE_PROVIDER_SITE_OTHER): Payer: Medicare HMO | Admitting: Podiatry

## 2023-09-28 DIAGNOSIS — B351 Tinea unguium: Secondary | ICD-10-CM | POA: Diagnosis not present

## 2023-09-28 DIAGNOSIS — E1351 Other specified diabetes mellitus with diabetic peripheral angiopathy without gangrene: Secondary | ICD-10-CM | POA: Diagnosis not present

## 2023-09-28 NOTE — Progress Notes (Signed)
This patient returns to my office for at risk foot care.  This patient requires this care by a professional since this patient will be at risk due to having diabetes PVD and thrombocytopenia.  patient is unable to cut nails himself since the patient cannot reach his nails.These nails are painful walking and wearing shoes.  This patient presents for at risk foot care today.  General Appearance  Alert, conversant and in no acute stress.  Vascular  Dorsalis pedis and posterior tibial  pulses are  weakly palpable  bilaterally.  Capillary return is within normal limits  bilaterally. Cold feet. bilaterally.  Neurologic  Senn-Weinstein monofilament wire test within normal limits  bilaterally. Muscle power within normal limits bilaterally.  Nails Thick disfigured discolored nails with subungual debris  from hallux to fifth toes bilaterally. No evidence of bacterial infection or drainage bilaterally.  Orthopedic  No limitations of motion  feet .  No crepitus or effusions noted.  No bony pathology or digital deformities noted.  Skin  normotropic skin with no porokeratosis noted bilaterally.  No signs of infections or ulcers noted.     Onychomycosis  Pain in right toes  Pain in left toes  Consent was obtained for treatment procedures.   Mechanical debridement of nails 1-5  bilaterally performed with a nail nipper.  Filed with dremel without incident.  Patient to make a appointment with Trish for diabetic shoes.   Return office visit   3 months                   Told patient to return for periodic foot care and evaluation due to potential at risk complications.   Helane Gunther DPM

## 2023-10-07 ENCOUNTER — Ambulatory Visit (INDEPENDENT_AMBULATORY_CARE_PROVIDER_SITE_OTHER): Payer: Medicare HMO

## 2023-10-07 DIAGNOSIS — I428 Other cardiomyopathies: Secondary | ICD-10-CM

## 2023-10-09 LAB — CUP PACEART REMOTE DEVICE CHECK
Battery Remaining Longevity: 5 mo
Battery Remaining Percentage: 5 %
Battery Voltage: 2.63 V
Brady Statistic AP VP Percent: 1.5 %
Brady Statistic AP VS Percent: 76 %
Brady Statistic AS VP Percent: 2.1 %
Brady Statistic AS VS Percent: 20 %
Brady Statistic RA Percent Paced: 77 %
Brady Statistic RV Percent Paced: 3.6 %
Date Time Interrogation Session: 20250110005027
HighPow Impedance: 63 Ohm
HighPow Impedance: 63 Ohm
Implantable Lead Connection Status: 753985
Implantable Lead Connection Status: 753985
Implantable Lead Implant Date: 20090708
Implantable Lead Implant Date: 20090708
Implantable Lead Location: 753859
Implantable Lead Location: 753860
Implantable Lead Model: 7122
Implantable Pulse Generator Implant Date: 20170106
Lead Channel Impedance Value: 310 Ohm
Lead Channel Impedance Value: 340 Ohm
Lead Channel Pacing Threshold Amplitude: 0.5 V
Lead Channel Pacing Threshold Amplitude: 1.25 V
Lead Channel Pacing Threshold Pulse Width: 0.5 ms
Lead Channel Pacing Threshold Pulse Width: 0.9 ms
Lead Channel Sensing Intrinsic Amplitude: 0.8 mV
Lead Channel Sensing Intrinsic Amplitude: 7.6 mV
Lead Channel Setting Pacing Amplitude: 2 V
Lead Channel Setting Pacing Amplitude: 2.5 V
Lead Channel Setting Pacing Pulse Width: 0.9 ms
Lead Channel Setting Sensing Sensitivity: 0.5 mV
Pulse Gen Serial Number: 7306179

## 2023-10-17 ENCOUNTER — Encounter: Payer: Self-pay | Admitting: Hematology

## 2023-10-17 ENCOUNTER — Emergency Department (HOSPITAL_COMMUNITY): Payer: No Typology Code available for payment source

## 2023-10-17 ENCOUNTER — Encounter (HOSPITAL_COMMUNITY): Payer: Self-pay | Admitting: Emergency Medicine

## 2023-10-17 ENCOUNTER — Emergency Department (HOSPITAL_COMMUNITY)
Admission: EM | Admit: 2023-10-17 | Discharge: 2023-10-17 | Disposition: A | Discharge status: Home / Self Care | Payer: No Typology Code available for payment source | Attending: Emergency Medicine | Admitting: Emergency Medicine

## 2023-10-17 ENCOUNTER — Other Ambulatory Visit: Payer: Self-pay

## 2023-10-17 DIAGNOSIS — Z794 Long term (current) use of insulin: Secondary | ICD-10-CM | POA: Insufficient documentation

## 2023-10-17 DIAGNOSIS — Z7901 Long term (current) use of anticoagulants: Secondary | ICD-10-CM | POA: Insufficient documentation

## 2023-10-17 DIAGNOSIS — N189 Chronic kidney disease, unspecified: Secondary | ICD-10-CM | POA: Insufficient documentation

## 2023-10-17 DIAGNOSIS — I5042 Chronic combined systolic (congestive) and diastolic (congestive) heart failure: Secondary | ICD-10-CM | POA: Insufficient documentation

## 2023-10-17 DIAGNOSIS — Z96651 Presence of right artificial knee joint: Secondary | ICD-10-CM | POA: Insufficient documentation

## 2023-10-17 DIAGNOSIS — Z7984 Long term (current) use of oral hypoglycemic drugs: Secondary | ICD-10-CM | POA: Diagnosis not present

## 2023-10-17 DIAGNOSIS — Z87891 Personal history of nicotine dependence: Secondary | ICD-10-CM | POA: Insufficient documentation

## 2023-10-17 DIAGNOSIS — Y92410 Unspecified street and highway as the place of occurrence of the external cause: Secondary | ICD-10-CM | POA: Insufficient documentation

## 2023-10-17 DIAGNOSIS — Z79899 Other long term (current) drug therapy: Secondary | ICD-10-CM | POA: Diagnosis not present

## 2023-10-17 DIAGNOSIS — E1122 Type 2 diabetes mellitus with diabetic chronic kidney disease: Secondary | ICD-10-CM | POA: Insufficient documentation

## 2023-10-17 DIAGNOSIS — I2510 Atherosclerotic heart disease of native coronary artery without angina pectoris: Secondary | ICD-10-CM | POA: Diagnosis not present

## 2023-10-17 DIAGNOSIS — Z8673 Personal history of transient ischemic attack (TIA), and cerebral infarction without residual deficits: Secondary | ICD-10-CM | POA: Diagnosis not present

## 2023-10-17 DIAGNOSIS — E11649 Type 2 diabetes mellitus with hypoglycemia without coma: Secondary | ICD-10-CM | POA: Diagnosis not present

## 2023-10-17 DIAGNOSIS — R410 Disorientation, unspecified: Secondary | ICD-10-CM | POA: Diagnosis not present

## 2023-10-17 DIAGNOSIS — I130 Hypertensive heart and chronic kidney disease with heart failure and stage 1 through stage 4 chronic kidney disease, or unspecified chronic kidney disease: Secondary | ICD-10-CM | POA: Insufficient documentation

## 2023-10-17 DIAGNOSIS — E162 Hypoglycemia, unspecified: Secondary | ICD-10-CM

## 2023-10-17 LAB — CBC WITH DIFFERENTIAL/PLATELET
Abs Immature Granulocytes: 0.04 10*3/uL (ref 0.00–0.07)
Basophils Absolute: 0 10*3/uL (ref 0.0–0.1)
Basophils Relative: 0 %
Eosinophils Absolute: 0.2 10*3/uL (ref 0.0–0.5)
Eosinophils Relative: 3 %
HCT: 41.3 % (ref 39.0–52.0)
Hemoglobin: 13 g/dL (ref 13.0–17.0)
Immature Granulocytes: 1 %
Lymphocytes Relative: 14 %
Lymphs Abs: 1.1 10*3/uL (ref 0.7–4.0)
MCH: 27.4 pg (ref 26.0–34.0)
MCHC: 31.5 g/dL (ref 30.0–36.0)
MCV: 86.9 fL (ref 80.0–100.0)
Monocytes Absolute: 0.7 10*3/uL (ref 0.1–1.0)
Monocytes Relative: 8 %
Neutro Abs: 5.9 10*3/uL (ref 1.7–7.7)
Neutrophils Relative %: 74 %
Platelets: 215 10*3/uL (ref 150–400)
RBC: 4.75 MIL/uL (ref 4.22–5.81)
RDW: 14.7 % (ref 11.5–15.5)
WBC: 8 10*3/uL (ref 4.0–10.5)
nRBC: 0 % (ref 0.0–0.2)

## 2023-10-17 LAB — BASIC METABOLIC PANEL
Anion gap: 10 (ref 5–15)
BUN: 22 mg/dL (ref 8–23)
CO2: 26 mmol/L (ref 22–32)
Calcium: 8.9 mg/dL (ref 8.9–10.3)
Chloride: 104 mmol/L (ref 98–111)
Creatinine, Ser: 1.09 mg/dL (ref 0.61–1.24)
GFR, Estimated: >60 mL/min (ref >60)
Glucose, Bld: 86 mg/dL (ref 70–99)
Potassium: 3.6 mmol/L (ref 3.5–5.1)
Sodium: 140 mmol/L (ref 135–145)

## 2023-10-17 LAB — CBG MONITORING, ED
Glucose-Capillary: 132 mg/dL — ABNORMAL HIGH (ref 70–99)
Glucose-Capillary: 64 mg/dL — ABNORMAL LOW (ref 70–99)
Glucose-Capillary: 106 mg/dL — ABNORMAL HIGH (ref 70–99)

## 2023-10-17 LAB — ETHANOL: Alcohol, Ethyl (B): <10 mg/dL (ref <10)

## 2023-10-17 MED ORDER — GLUCOSE 40 % PO GEL
1.0000 | Freq: Once | ORAL | Status: AC
  Administered 2023-10-17: 31 g via ORAL
  Filled 2023-10-17: qty 1.21

## 2023-10-17 NOTE — ED Notes (Signed)
MD notified of CBG of 64 on finger stick. See new orders.
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
## 2023-10-17 NOTE — ED Notes (Signed)
Graham cracker, peanut butter, and orange juice given to patient.
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
## 2023-10-17 NOTE — Discharge Instructions (Signed)
Here are the changes I would like you to make to your medications:  --Stop taking glimepiride (amaryl)  -- Go back to taking 30 units of your insulin in the morning.  You can continue taking all of your other medications as prescribed.  Today, check your sugar every 2 hours until bedtime.  If you wake up in the middle of the night, check your blood sugar.  I would also eat a little something before going to bed to make sure that your sugar does not drop too far while you are asleep.    Your blood sugar may run a little bit higher than it usually does over the next week, and that is okay for short period of time.  If your sugar measures low, eat or drink some food, and recheck it in 30 minutes.  If it remains low, or any feel confused, have your family bring you to the emergency room.  On Monday, please call your doctor to discuss these medication changes and to make an appointment.

## 2023-10-21 ENCOUNTER — Other Ambulatory Visit: Payer: Medicare HMO

## 2023-10-23 ENCOUNTER — Encounter: Payer: Self-pay | Admitting: Hematology

## 2023-10-23 ENCOUNTER — Inpatient Hospital Stay: Payer: Medicare HMO

## 2023-10-23 ENCOUNTER — Inpatient Hospital Stay: Payer: Medicare HMO | Attending: Hematology

## 2023-10-23 VITALS — BP 143/68 | HR 60 | Temp 97.9°F | Resp 18

## 2023-10-23 DIAGNOSIS — D509 Iron deficiency anemia, unspecified: Secondary | ICD-10-CM

## 2023-10-23 DIAGNOSIS — D518 Other vitamin B12 deficiency anemias: Secondary | ICD-10-CM | POA: Insufficient documentation

## 2023-10-23 MED ORDER — CYANOCOBALAMIN 1000 MCG/ML IJ SOLN
1000.0000 ug | Freq: Once | INTRAMUSCULAR | Status: AC
  Administered 2023-10-23: 1000 ug via SUBCUTANEOUS
  Filled 2023-10-23: qty 1

## 2023-11-18 NOTE — Addendum Note (Signed)
 Addended by: Elease Etienne A on: 11/18/2023 09:48 AM   Modules accepted: Orders

## 2023-11-18 NOTE — Progress Notes (Signed)
 Remote ICD transmission.

## 2023-11-20 ENCOUNTER — Inpatient Hospital Stay: Payer: Medicare HMO | Attending: Oncology

## 2023-11-20 ENCOUNTER — Inpatient Hospital Stay: Payer: Medicare HMO

## 2023-11-20 VITALS — BP 164/72 | HR 66 | Temp 97.6°F | Resp 17

## 2023-11-20 DIAGNOSIS — D518 Other vitamin B12 deficiency anemias: Secondary | ICD-10-CM | POA: Diagnosis present

## 2023-11-20 DIAGNOSIS — D509 Iron deficiency anemia, unspecified: Secondary | ICD-10-CM

## 2023-11-20 MED ORDER — CYANOCOBALAMIN 1000 MCG/ML IJ SOLN
1000.0000 ug | Freq: Once | INTRAMUSCULAR | Status: AC
  Administered 2023-11-20: 1000 ug via SUBCUTANEOUS
  Filled 2023-11-20: qty 1

## 2023-12-14 ENCOUNTER — Encounter

## 2023-12-16 ENCOUNTER — Telehealth: Payer: Self-pay | Admitting: Internal Medicine

## 2023-12-16 NOTE — Telephone Encounter (Signed)
 Pt is needing assistance with his home equipment, not functioning properly, requesting cb

## 2023-12-16 NOTE — Telephone Encounter (Signed)
 Pt coming in 12/17/2023 to get device interrogated.

## 2023-12-17 ENCOUNTER — Ambulatory Visit: Attending: Cardiology

## 2023-12-17 ENCOUNTER — Encounter: Payer: Self-pay | Admitting: Hematology

## 2023-12-17 DIAGNOSIS — I428 Other cardiomyopathies: Secondary | ICD-10-CM

## 2023-12-17 LAB — CUP PACEART INCLINIC DEVICE CHECK
Battery Remaining Longevity: 0 mo
Brady Statistic RA Percent Paced: 77 %
Brady Statistic RV Percent Paced: 4.8 %
Date Time Interrogation Session: 20250320123616
HighPow Impedance: 65.25 Ohm
Implantable Lead Connection Status: 753985
Implantable Lead Connection Status: 753985
Implantable Lead Implant Date: 20090708
Implantable Lead Implant Date: 20090708
Implantable Lead Location: 753859
Implantable Lead Location: 753860
Implantable Lead Model: 7122
Implantable Pulse Generator Implant Date: 20170106
Lead Channel Impedance Value: 312.5 Ohm
Lead Channel Impedance Value: 350 Ohm
Lead Channel Sensing Intrinsic Amplitude: 1.8 mV
Lead Channel Sensing Intrinsic Amplitude: 7.9 mV
Lead Channel Setting Pacing Amplitude: 2 V
Lead Channel Setting Pacing Amplitude: 2.5 V
Lead Channel Setting Pacing Pulse Width: 0.9 ms
Lead Channel Setting Sensing Sensitivity: 0.5 mV
Pulse Gen Serial Number: 7306179

## 2023-12-17 LAB — BASIC METABOLIC PANEL
BUN/Creatinine Ratio: 19 (ref 10–24)
BUN: 21 mg/dL (ref 8–27)
CO2: 27 mmol/L (ref 20–29)
Calcium: 9.5 mg/dL (ref 8.6–10.2)
Chloride: 102 mmol/L (ref 96–106)
Creatinine, Ser: 1.13 mg/dL (ref 0.76–1.27)
Glucose: 120 mg/dL — ABNORMAL HIGH (ref 70–99)
Potassium: 4.6 mmol/L (ref 3.5–5.2)
Sodium: 143 mmol/L (ref 134–144)
eGFR: 67 mL/min/{1.73_m2} (ref >59)

## 2023-12-17 NOTE — Progress Notes (Signed)
 Pt seen in device clinic to evaluate RF telemetry issue. Upon device interrogation Pt had reached ERI December 07, 2023. Pt scheduled for generator change.

## 2023-12-17 NOTE — Patient Instructions (Signed)
 Follow up post generator change.

## 2023-12-18 LAB — CBC WITH DIFFERENTIAL/PLATELET
Basophils Absolute: 0 10*3/uL (ref 0.0–0.2)
Basos: 1 %
EOS (ABSOLUTE): 0.2 10*3/uL (ref 0.0–0.4)
Eos: 3 %
Hematocrit: 43.3 % (ref 37.5–51.0)
Hemoglobin: 13.7 g/dL (ref 13.0–17.7)
Immature Grans (Abs): 0 10*3/uL (ref 0.0–0.1)
Immature Granulocytes: 0 %
Lymphocytes Absolute: 1.8 10*3/uL (ref 0.7–3.1)
Lymphs: 29 %
MCH: 27.2 pg (ref 26.6–33.0)
MCHC: 31.6 g/dL (ref 31.5–35.7)
MCV: 86 fL (ref 79–97)
Monocytes Absolute: 0.9 10*3/uL (ref 0.1–0.9)
Monocytes: 14 %
Neutrophils Absolute: 3.4 10*3/uL (ref 1.4–7.0)
Neutrophils: 53 %
Platelets: 257 10*3/uL (ref 150–450)
RBC: 5.03 x10E6/uL (ref 4.14–5.80)
RDW: 15.3 % (ref 11.6–15.4)
WBC: 6.4 10*3/uL (ref 3.4–10.8)

## 2023-12-22 ENCOUNTER — Telehealth: Payer: Self-pay

## 2023-12-22 NOTE — Telephone Encounter (Signed)
 I called pt and notified him that due to Dr. Ladona Ridgel having an accident we would need to move his procedure. He has been rescheduled from 3/26 to 4/7 at 9:30 am. Pt is aware to arrive at 7:30 am.   He will resume his Xarelto for now and hold starting Friday, [place].  I will mail his updated Instruction letter to him per his request.

## 2023-12-28 ENCOUNTER — Telehealth (HOSPITAL_COMMUNITY): Payer: Self-pay

## 2023-12-28 ENCOUNTER — Ambulatory Visit: Payer: Medicare HMO | Admitting: Podiatry

## 2023-12-28 NOTE — Telephone Encounter (Signed)
 Attempted to reach patient to discuss upcoming procedure, no answer. Left VM for patient to return call.

## 2023-12-29 ENCOUNTER — Telehealth: Payer: Self-pay

## 2023-12-29 DIAGNOSIS — Z01812 Encounter for preprocedural laboratory examination: Secondary | ICD-10-CM

## 2023-12-29 NOTE — Telephone Encounter (Signed)
 Spoke with Pt. Changed procedure date. Updated instruction letter mailed to his house. Aware of need for new lab work with in 30 days of new date.

## 2023-12-29 NOTE — Telephone Encounter (Signed)
 Received a return call from patient to discuss upcoming procedure.   Confirmed patient is scheduled for a ICD generator change on Monday, April 7 with Dr. Lewayne Bunting. Instructed patient to arrive at the Main Entrance A at [facility]: [address] [place], [place] [address] and check in at Admitting at 7:30 AM.   Labs completed  Any recent signs of acute illness or been started on antibiotics?  No Any new medications started? No Any medications to hold? Xarelto for 2 days  Medication instructions:  On the morning of your procedure only take Carvedilol.  No eating or drinking after midnight prior to procedure.   The night before your procedure and the morning of your procedure scrub your neck/chest with the CHG surgical soap.   Advised of plan to go home the same day and will only stay overnight if medically necessary. You MUST have a responsible adult to drive you home and MUST be with you the first 24 hours after you arrive home.  Patient verbalized understanding to all instructions provided and agreed to proceed with procedure.

## 2023-12-30 ENCOUNTER — Other Ambulatory Visit: Payer: Self-pay | Admitting: Internal Medicine

## 2023-12-30 DIAGNOSIS — I480 Paroxysmal atrial fibrillation: Secondary | ICD-10-CM

## 2023-12-30 NOTE — Telephone Encounter (Signed)
 Xarelto 20mg  refill request received. Pt is 78 years old, weight-95.7kg, Crea-1.13 on 12/17/23, last seen by Dr Ladona Ridgel on 07/09/23, Diagnosis-afib, CrCl- 75.28 mL/min; Dose is appropriate based on dosing criteria. Will send in refill to requested pharmacy.

## 2024-01-07 ENCOUNTER — Ambulatory Visit

## 2024-01-07 ENCOUNTER — Telehealth: Payer: Self-pay | Admitting: Internal Medicine

## 2024-01-07 NOTE — Telephone Encounter (Signed)
 Patient called to follow-up on when his procedure will be rescheduled.

## 2024-01-08 NOTE — Telephone Encounter (Signed)
 Spoke with pt to go over instructions. Pt stated understanding. Pt will call back with any questions.

## 2024-01-13 ENCOUNTER — Ambulatory Visit: Admitting: Podiatry

## 2024-01-14 ENCOUNTER — Ambulatory Visit (INDEPENDENT_AMBULATORY_CARE_PROVIDER_SITE_OTHER)

## 2024-01-14 ENCOUNTER — Ambulatory Visit: Admitting: Podiatry

## 2024-01-14 ENCOUNTER — Encounter: Payer: Self-pay | Admitting: Podiatry

## 2024-01-14 DIAGNOSIS — B351 Tinea unguium: Secondary | ICD-10-CM

## 2024-01-14 DIAGNOSIS — E1169 Type 2 diabetes mellitus with other specified complication: Secondary | ICD-10-CM

## 2024-01-14 DIAGNOSIS — E1351 Other specified diabetes mellitus with diabetic peripheral angiopathy without gangrene: Secondary | ICD-10-CM

## 2024-01-14 DIAGNOSIS — I428 Other cardiomyopathies: Secondary | ICD-10-CM | POA: Diagnosis not present

## 2024-01-14 DIAGNOSIS — I739 Peripheral vascular disease, unspecified: Secondary | ICD-10-CM

## 2024-01-14 DIAGNOSIS — E1151 Type 2 diabetes mellitus with diabetic peripheral angiopathy without gangrene: Secondary | ICD-10-CM

## 2024-01-14 NOTE — Progress Notes (Signed)
 This patient returns to my office for at risk foot care.  This patient requires this care by a professional since this patient will be at risk due to having diabetes PVD and thrombocytopenia.  patient is unable to cut nails himself since the patient cannot reach his nails.These nails are painful walking and wearing shoes.  This patient presents for at risk foot care today.  General Appearance  Alert, conversant and in no acute stress.  Vascular  Dorsalis pedis and posterior tibial  pulses are  weakly palpable  bilaterally.  Capillary return is within normal limits  bilaterally. Cold feet. bilaterally.  Neurologic  Senn-Weinstein monofilament wire test within normal limits  bilaterally. Muscle power within normal limits bilaterally.  Nails Thick disfigured discolored nails with subungual debris  from hallux to fifth toes bilaterally. No evidence of bacterial infection or drainage bilaterally.  Orthopedic  No limitations of motion  feet .  No crepitus or effusions noted.  No bony pathology or digital deformities noted.  Skin  normotropic skin with no porokeratosis noted bilaterally.  No signs of infections or ulcers noted.     Onychomycosis  Pain in right toes  Pain in left toes  Consent was obtained for treatment procedures.   Mechanical debridement of nails 1-5  bilaterally performed with a nail nipper.  Filed with dremel without incident.  Patient to make a appointment with Trish for diabetic shoes.   Return office visit   3 months                   Told patient to return for periodic foot care and evaluation due to potential at risk complications.   Helane Gunther DPM

## 2024-01-18 ENCOUNTER — Other Ambulatory Visit (HOSPITAL_COMMUNITY): Payer: Self-pay

## 2024-01-18 ENCOUNTER — Telehealth (HOSPITAL_COMMUNITY): Payer: Self-pay

## 2024-01-18 DIAGNOSIS — Z01812 Encounter for preprocedural laboratory examination: Secondary | ICD-10-CM

## 2024-01-18 LAB — CUP PACEART REMOTE DEVICE CHECK
Battery Remaining Longevity: 0 mo
Battery Voltage: 2.59 V
Brady Statistic AP VP Percent: 1.9 %
Brady Statistic AP VS Percent: 63 %
Brady Statistic AS VP Percent: 3.5 %
Brady Statistic AS VS Percent: 31 %
Brady Statistic RA Percent Paced: 64 %
Brady Statistic RV Percent Paced: 5.5 %
Date Time Interrogation Session: 20250418224756
HighPow Impedance: 60 Ohm
HighPow Impedance: 60 Ohm
Implantable Lead Connection Status: 753985
Implantable Lead Connection Status: 753985
Implantable Lead Implant Date: 20090708
Implantable Lead Implant Date: 20090708
Implantable Lead Location: 753859
Implantable Lead Location: 753860
Implantable Lead Model: 7122
Implantable Pulse Generator Implant Date: 20170106
Lead Channel Impedance Value: 330 Ohm
Lead Channel Impedance Value: 340 Ohm
Lead Channel Pacing Threshold Amplitude: 0.5 V
Lead Channel Pacing Threshold Amplitude: 1.25 V
Lead Channel Pacing Threshold Pulse Width: 0.5 ms
Lead Channel Pacing Threshold Pulse Width: 0.9 ms
Lead Channel Sensing Intrinsic Amplitude: 2 mV
Lead Channel Sensing Intrinsic Amplitude: 8.2 mV
Lead Channel Setting Pacing Amplitude: 2 V
Lead Channel Setting Pacing Amplitude: 2.5 V
Lead Channel Setting Pacing Pulse Width: 0.9 ms
Lead Channel Setting Sensing Sensitivity: 0.5 mV
Pulse Gen Serial Number: 7306179

## 2024-01-18 NOTE — Telephone Encounter (Signed)
 Patient called to confirm when his procedure is scheduled.   Confirmed patient is scheduled for  ICD generator change on Tuesday, May 20 with Dr. Manya Sells. Instructed patient to arrive at the Main Entrance A at [facility]: [address] [place], [place] [address] and check in at Admitting at 0730 AM.  Advised of plan to go home the same day and will only stay overnight if medically necessary. You MUST have a responsible adult to drive you home and MUST be with you the first 24 hours after you arrive home or your procedure could be cancelled.  New medical conditions?  No Recent hospitalizations or surgeries? No Started any new medications? No Patient made aware to contact office to inform of any new medications started. Any changes in activities of daily living? No  Pre-procedure testing scheduled: lab work ordered. Pt states he will try to get lab work completed this week.   Pt does not recall receiving an updated instructions letter in the mail.    Instructions reviewed:  No eating or drinking after midnight prior to procedure.   HOLD Xarelto  for 2 days prior to your procedure. Last dose will be on Saturday, May 17.  Only take Carvedilol  on the morning of your procedure.  Advised patient to stop by Dr. Meridith Stanford office after lab work to request a copy of instructions letter for his reference. Pt agreeable to do so.     Patient verbalized understanding to information provided and is agreeable to proceed with procedure.

## 2024-01-20 ENCOUNTER — Ambulatory Visit

## 2024-02-11 LAB — CBC
Hematocrit: 43.2 % (ref 37.5–51.0)
Hemoglobin: 13.4 g/dL (ref 13.0–17.7)
MCH: 26.9 pg (ref 26.6–33.0)
MCHC: 31 g/dL — ABNORMAL LOW (ref 31.5–35.7)
MCV: 87 fL (ref 79–97)
Platelets: 245 10*3/uL (ref 150–450)
RBC: 4.99 x10E6/uL (ref 4.14–5.80)
RDW: 15.1 % (ref 11.6–15.4)
WBC: 6.4 10*3/uL (ref 3.4–10.8)

## 2024-02-11 LAB — BASIC METABOLIC PANEL WITH GFR
BUN/Creatinine Ratio: 14 (ref 10–24)
BUN: 15 mg/dL (ref 8–27)
CO2: 23 mmol/L (ref 20–29)
Calcium: 9 mg/dL (ref 8.6–10.2)
Chloride: 102 mmol/L (ref 96–106)
Creatinine, Ser: 1.04 mg/dL (ref 0.76–1.27)
Glucose: 113 mg/dL — ABNORMAL HIGH (ref 70–99)
Potassium: 4.6 mmol/L (ref 3.5–5.2)
Sodium: 143 mmol/L (ref 134–144)
eGFR: 74 mL/min/{1.73_m2} (ref >59)

## 2024-02-15 ENCOUNTER — Encounter

## 2024-02-15 NOTE — Pre-Procedure Instructions (Signed)
 Instructed patient on the following items: Arrival time 0730 Nothing to eat or drink after midnight No meds AM of procedure Responsible person to drive you home and stay with you for 24 hrs Wash with special soap night before and morning of procedure If on anti-coagulant drug instructions Xarelto - last dose 5/17

## 2024-02-16 ENCOUNTER — Encounter: Payer: Self-pay | Admitting: Emergency Medicine

## 2024-02-16 ENCOUNTER — Other Ambulatory Visit: Payer: Self-pay

## 2024-02-16 ENCOUNTER — Ambulatory Visit (HOSPITAL_COMMUNITY)
Admission: RE | Admit: 2024-02-16 | Discharge: 2024-02-16 | Disposition: A | Discharge status: Home / Self Care | Source: Ambulatory Visit | Attending: Internal Medicine | Admitting: Internal Medicine

## 2024-02-16 ENCOUNTER — Ambulatory Visit (HOSPITAL_COMMUNITY)
Admission: RE | Disposition: A | Discharge status: Home / Self Care | Payer: Self-pay | Source: Ambulatory Visit | Attending: Internal Medicine

## 2024-02-16 DIAGNOSIS — I4819 Other persistent atrial fibrillation: Secondary | ICD-10-CM | POA: Insufficient documentation

## 2024-02-16 DIAGNOSIS — Z7901 Long term (current) use of anticoagulants: Secondary | ICD-10-CM | POA: Insufficient documentation

## 2024-02-16 DIAGNOSIS — I5022 Chronic systolic (congestive) heart failure: Secondary | ICD-10-CM | POA: Diagnosis not present

## 2024-02-16 DIAGNOSIS — Z4502 Encounter for adjustment and management of automatic implantable cardiac defibrillator: Secondary | ICD-10-CM

## 2024-02-16 DIAGNOSIS — I130 Hypertensive heart and chronic kidney disease with heart failure and stage 1 through stage 4 chronic kidney disease, or unspecified chronic kidney disease: Secondary | ICD-10-CM | POA: Insufficient documentation

## 2024-02-16 DIAGNOSIS — Z87891 Personal history of nicotine dependence: Secondary | ICD-10-CM | POA: Diagnosis not present

## 2024-02-16 DIAGNOSIS — I472 Ventricular tachycardia, unspecified: Secondary | ICD-10-CM | POA: Diagnosis not present

## 2024-02-16 DIAGNOSIS — Z79899 Other long term (current) drug therapy: Secondary | ICD-10-CM | POA: Insufficient documentation

## 2024-02-16 HISTORY — PX: ICD GENERATOR CHANGEOUT: EP1231

## 2024-02-16 LAB — GLUCOSE, CAPILLARY: Glucose-Capillary: 100 mg/dL — ABNORMAL HIGH (ref 70–99)

## 2024-02-16 SURGERY — ICD GENERATOR CHANGEOUT

## 2024-02-16 MED ORDER — CEFAZOLIN SODIUM-DEXTROSE 2-4 GM/100ML-% IV SOLN: 2.0000 g | INTRAVENOUS | Status: AC

## 2024-02-16 MED ORDER — GENTAMICIN SULFATE 40 MG/ML IJ SOLN
INTRAMUSCULAR | Status: AC
  Filled 2024-02-16: qty 2

## 2024-02-16 MED ORDER — FENTANYL CITRATE (PF) 100 MCG/2ML IJ SOLN
INTRAMUSCULAR | Status: AC
  Filled 2024-02-16: qty 2

## 2024-02-16 MED ORDER — SODIUM CHLORIDE 0.9 % IV SOLN
80.0000 mg | INTRAVENOUS | Status: AC
  Administered 2024-02-16: 80 mg

## 2024-02-16 MED ORDER — ACETAMINOPHEN 325 MG PO TABS: 325.0000 mg | ORAL_TABLET | ORAL | Status: DC | PRN

## 2024-02-16 MED ORDER — SODIUM CHLORIDE 0.9 % IV SOLN: INTRAVENOUS | Status: DC

## 2024-02-16 MED ORDER — LIDOCAINE HCL (PF) 1 % IJ SOLN
INTRAMUSCULAR | Status: DC | PRN
Start: 2024-02-16 — End: 2024-02-16
  Administered 2024-02-16: 60 mL

## 2024-02-16 MED ORDER — LIDOCAINE HCL (PF) 1 % IJ SOLN
INTRAMUSCULAR | Status: AC
  Filled 2024-02-16: qty 60

## 2024-02-16 MED ORDER — POVIDONE-IODINE 10 % EX SWAB
2.0000 | Freq: Once | CUTANEOUS | Status: AC
  Administered 2024-02-16: 2 via TOPICAL

## 2024-02-16 MED ORDER — CEFAZOLIN SODIUM-DEXTROSE 2-4 GM/100ML-% IV SOLN
INTRAVENOUS | Status: AC
  Administered 2024-02-16: 2 g via INTRAVENOUS
  Filled 2024-02-16: qty 100

## 2024-02-16 MED ORDER — FENTANYL CITRATE (PF) 100 MCG/2ML IJ SOLN
INTRAMUSCULAR | Status: DC | PRN
  Administered 2024-02-16: 12.5 ug via INTRAVENOUS

## 2024-02-16 MED ORDER — MIDAZOLAM HCL 2 MG/2ML IJ SOLN
INTRAMUSCULAR | Status: AC
Start: 2024-02-16 — End: ?
  Filled 2024-02-16: qty 2

## 2024-02-16 MED ORDER — CHLORHEXIDINE GLUCONATE 4 % EX SOLN
4.0000 | Freq: Once | CUTANEOUS | Status: DC
  Filled 2024-02-16: qty 60

## 2024-02-16 MED ORDER — MIDAZOLAM HCL 5 MG/5ML IJ SOLN
INTRAMUSCULAR | Status: DC | PRN
  Administered 2024-02-16: 1 mg via INTRAVENOUS

## 2024-02-16 SURGICAL SUPPLY — 8 items
CABLE SURGICAL S-101-97-12 (CABLE) ×2 IMPLANT
ICD GALLANT DR DF1 CDDRA500T (ICD Generator) IMPLANT
KIT WRENCH (KITS) IMPLANT
PAD DEFIB RADIO PHYSIO CONN (PAD) ×2 IMPLANT
PIN PLUG IS-1 DEFIB (PIN) IMPLANT
POUCH AIGIS-R ANTIBACT ICD (Mesh General) ×1 IMPLANT
POUCH AIGIS-R ANTIBACT ICD LRG (Mesh General) IMPLANT
TRAY PACEMAKER INSERTION (PACKS) ×2 IMPLANT

## 2024-02-16 NOTE — Discharge Instructions (Signed)

## 2024-02-16 NOTE — H&P (Signed)
 HPI [name] returns today for followup. He is a pleasant 78 yo man with a h/o chronic systolic heart failure, poorly controlled atrial fib, persistent atrial fib, h/o GI bleeding on Xarelto , and s/p ICD. I saw him 12 months ago. His Xarelto  was restarted. He denies chest pain. He has mild peripheral edema. He was noted to have some atrial undersensing on remote monitoring. Since I saw him last, he notes some back and neck pain. He had some atrial undersensing on his remote monitor with PAC's in the PVARP.  Allergies      Allergies  Allergen Reactions   Codeine Nausea And Vomiting                Current Outpatient Medications  Medication Sig Dispense Refill   acetaminophen  (TYLENOL ) 500 MG tablet Take 1 tablet (500 mg total) by mouth every 6 (six) hours as needed. 30 tablet 0   atorvastatin  (LIPITOR ) 40 MG tablet Take 40 mg by mouth daily.       baclofen  (LIORESAL ) 20 MG tablet Take 40 mg by mouth at bedtime.        carvedilol  (COREG ) 25 MG tablet Take 1 tablet (25 mg total) by mouth 2 (two) times daily with a meal. 180 tablet 3   cyclobenzaprine  (FLEXERIL ) 10 MG tablet Take 1 tablet (10 mg total) by mouth 2 (two) times daily as needed for muscle spasms. 20 tablet 0   DULoxetine  (CYMBALTA ) 30 MG capsule Take 30 mg by mouth daily.       furosemide  (LASIX ) 20 MG tablet Take 1 tablet (20 mg total) by mouth 2 (two) times daily. 30 tablet     glimepiride  (AMARYL ) 4 MG tablet Take 1 tablet (4 mg total) by mouth daily with breakfast.       linagliptin  (TRADJENTA ) 5 MG TABS tablet Take 5 mg by mouth daily after breakfast.        meclizine  (ANTIVERT ) 25 MG tablet Take 25 mg by mouth 4 (four) times daily as needed. For dizziness       metFORMIN (GLUCOPHAGE) 500 MG tablet Take 500 mg by mouth daily with breakfast.       oxyCODONE -acetaminophen  (PERCOCET/ROXICET) 5-325 MG tablet Take a half tablet every 6 hours       pregabalin  (LYRICA ) 75 MG capsule Take 1 capsule (75 mg total) by  mouth 2 (two) times daily. 60 capsule 0   rivaroxaban  (XARELTO ) 20 MG TABS tablet TAKE 1 TABLET BY MOUTH ONCE DAILY WITH SUPPER 90 tablet 1   SOLIQUA 100-33 UNT-MCG/ML SOPN Inject 45 Units into the skin daily.       tamsulosin (FLOMAX) 0.4 MG CAPS capsule Take 0.4 mg by mouth daily.       triamcinolone cream (KENALOG) 0.5 % triamcinolone acetonide 0.5 % topical cream  APPLY CREAM TOPICALLY TO AFFECTED AREA TWICE DAILY          No current facility-administered medications for this visit.              Past Medical History:  Diagnosis Date   Anemia      Multifactorial  - hx of B12 def and iron  deficiency, followed by heme, followed at Cancer center- [facility]   Automatic implantable cardioverter-defibrillator in situ     CAD (coronary artery disease)      a. STEMI s/p CABG 07/2007 (had preop cardiogenic shock, IABP, VDRF before surgery)   Cardiomyopathy, ischemic      a.  Chronic systolic CHF s/p St. Jude dual chamber ICD 03/2008.   Carotid stenosis      a. Prior carotid dz/ surgery. b. Carotid dopp 07/2011 - 0-39% bilaterally.;   c. Doppler 11/13: 40-59% RICA, 0-39% LICA   Chronic renal insufficiency     Diabetes mellitus     GI bleed      Reported history   Hx of CABG     Hypertension      [place] [facility]    Mitral regurgitation     Myocardial infarction [facility])     Neuromuscular disorder (HCC)      R sided weakness    Osteoarthritis     PAF (paroxysmal atrial fibrillation) (HCC)      a. Poor Coumadin candidate due to hx of GI bleed.   Sinus bradycardia     Sleep apnea      study done, 10 yrs ago, unable to tolerate CPAP   Splenomegaly     Stroke [facility])      a. CVA 1990s - chronic pain in RUE after stroke.   Systolic CHF, chronic (HCC)      Previously taken off lisinopril  by primary doctor due to labs   Tobacco abuse            ROS:    All systems reviewed and negative except as noted in the HPI.          Past Surgical History:  Procedure Laterality Date   CARDIAC  DEFIBRILLATOR PLACEMENT   04/05/2008    Implantation of a St. Jude dual-chamber defibrillator, Stevens Eland. Carolynne Citron , MD   CARDIAC DEFIBRILLATOR PLACEMENT       CHOLECYSTECTOMY N/A 02/19/2016    Procedure: LAPAROSCOPIC CHOLECYSTECTOMY;  Surgeon: Oza Blumenthal, MD;  Location: [facility] OR;  Service: General;  Laterality: N/A;   COLONOSCOPY   07/2007    Rourk: left-sided diverticulum. TI normal. No etiology for IDA.   CORONARY ARTERY BYPASS GRAFT       CORONARY ARTERY BYPASS GRAFT   08/29/2007    x3; Dominick Fries H. Alva Jewels MD   EP IMPLANTABLE DEVICE N/A 10/05/2015    Procedure: ICD Generator Changeout;  Surgeon: Tammie Fall, MD;  Location: [facility] INVASIVE CV LAB;  Service: Cardiovascular;  Laterality: N/A;   ERCP N/A 02/18/2016    Procedure: ENDOSCOPIC RETROGRADE CHOLANGIOPANCREATOGRAPHY (ERCP);  Surgeon: Ozell Blunt, MD;  Location: [facility] ENDOSCOPY;  Service: Endoscopy;  Laterality: N/A;   ESOPHAGOGASTRODUODENOSCOPY   07/2007    Rourk: small hh, atrophic gastric mucosa, poor distensibility of stomach ?extrinsic compression, CT showed mild to moderate splenomegaly. No etiology for IDA.   JOINT REPLACEMENT   1990's    L knee   LAPAROSCOPIC APPENDECTOMY N/A 09/14/2017    Procedure: APPENDECTOMY LAPAROSCOPIC;  Surgeon: Jerryl Morin, MD;  Location: [facility] OR;  Service: General;  Laterality: N/A;   Percutaneous coronary intervention   01/10/2005    Lavina Pou, MD   TONSILLECTOMY       TOTAL KNEE ARTHROPLASTY Right 08/16/2013    Procedure: RIGHT TOTAL KNEE ARTHROPLASTY;  Surgeon: Arnie Lao, MD;  Location: [facility] OR;  Service: Orthopedics;  Laterality: Right;                 Family History  Problem Relation Age of Onset   Heart attack Mother          CVA, MI   Diabetes Mother     Heart attack Father          MI  Diabetes Sister     Coronary artery disease Brother          CABG            Social History         Socioeconomic History   Marital status: Unknown      Spouse name: Not on file    Number of children: Not on file   Years of education: Not on file   Highest education level: Not on file  Occupational History   Occupation: Retired      Associate Professor: UNEMPLOYED  Tobacco Use   Smoking status: Former      Current packs/day: 0.00      Average packs/day: 1 pack/day for 15.0 years (15.0 ttl pk-yrs)      Types: Cigarettes      Start date: 09/30/1975      Quit date: 09/29/1990      Years since quitting: 32.7   Smokeless tobacco: Former      Quit date: 08/09/1988  Vaping Use   Vaping status: Never Used  Substance and Sexual Activity   Alcohol use: No   Drug use: No   Sexual activity: Not Currently  Other Topics Concern   Not on file  Social History Narrative    ** Merged History Encounter **         Lives in [place] with wife    Been on disability since his stroke in the 1990s    Not routinely exercising    Social Determinants of Health    Financial Resource Strain: Not on file  Food Insecurity: Not on file  Transportation Needs: Not on file  Physical Activity: Not on file  Stress: Not on file  Social Connections: Not on file  Intimate Partner Violence: Not on file        Ht 6\' 2"  (1.88 m)   Wt 211 lb (95.7 kg)   BMI 27.09 kg/m    Physical Exam:   Well appearing NAD HEENT: Unremarkable Neck:  No JVD, no thyromegally Lymphatics:  No adenopathy Back:  No CVA tenderness Lungs:  Clear with no wheezes CV: reg rate rhythm, no murmurs, no rubs, no clicks Abd:  soft, positive bowel sounds, no organomegally, no rebound, no guarding Ext:  2 plus pulses, no edema, no cyanosis, no clubbing Skin:  No rashes no nodules Neuro:  CN II through XII intact, motor grossly intact   DEVICE  Normal device function.  See PaceArt for details.    Assess/Plan:   PAF - he is back in NSR. We reprogrammed his device to allow for atrial pacing and intrinsic conduction. Atrial undersensing - his P waves are 1 and sensitivity is set at 0.5. we reduce his PVARP as he did not  have much VA conduction.  Chronic systolic heart failure - his symptoms have been class 2 with reprogramming of his device from VVI to DDD with a long AV delay. ICD - as above. His device is working normally. Coags - he will continue xarelto .   Donnald Fuss

## 2024-02-17 ENCOUNTER — Encounter (HOSPITAL_COMMUNITY): Payer: Self-pay | Admitting: Internal Medicine

## 2024-02-17 MED FILL — Midazolam HCl Inj 2 MG/2ML (Base Equivalent): INTRAMUSCULAR | Qty: 1 | Status: AC

## 2024-02-24 NOTE — Progress Notes (Signed)
 Remote ICD transmission.

## 2024-02-24 NOTE — Addendum Note (Signed)
 Addended by: Lott Rouleau A on: 02/24/2024 10:18 AM   Modules accepted: Orders

## 2024-03-01 ENCOUNTER — Ambulatory Visit: Attending: Cardiology

## 2024-03-01 NOTE — Patient Instructions (Incomplete)

## 2024-03-04 ENCOUNTER — Ambulatory Visit
Admission: EM | Admit: 2024-03-04 | Discharge: 2024-03-04 | Disposition: A | Discharge status: Home / Self Care | Attending: Nurse Practitioner | Admitting: Nurse Practitioner

## 2024-03-04 DIAGNOSIS — M25512 Pain in left shoulder: Secondary | ICD-10-CM

## 2024-03-04 DIAGNOSIS — S46912A Strain of unspecified muscle, fascia and tendon at shoulder and upper arm level, left arm, initial encounter: Secondary | ICD-10-CM

## 2024-03-04 MED ORDER — TIZANIDINE HCL 2 MG PO TABS: 2.0000 mg | ORAL_TABLET | Freq: Every evening | ORAL | 0 refills | Status: DC | PRN

## 2024-03-04 NOTE — ED Provider Notes (Signed)
 [facility]    CSN: [id number] Arrival date & time: 03/04/24  8657      History   Chief Complaint Chief Complaint  Patient presents with   Shoulder Pain    HPI [name] is a 78 y.o. male.   The history is provided by the patient.   Patient presents for a 1 day history of left shoulder pain.  Patient states he was on his lawn more, and when he went to turn it back on, it "jerked" him in his left shoulder.  Patient states that immediately after symptoms occurred, he had pain in the right shoulder along with decreased range of motion.  He states this morning, his range of motion has since improved.  He continues to have pain to the front aspect of his shoulder.  States that he took a "pain pill" last evening with minimal relief.  Patient denies bruising, swelling, redness, neck pain, radiation of pain, or numbness/tingling in the upper extremity.  Past Medical History:  Diagnosis Date   Anemia    Multifactorial  - hx of B12 def and iron  deficiency, followed by heme, followed at Cancer center- [facility]   Automatic implantable cardioverter-defibrillator in situ    CAD (coronary artery disease)    a. STEMI s/p CABG 07/2007 (had preop cardiogenic shock, IABP, VDRF before surgery)   Cardiomyopathy, ischemic    a. Chronic systolic CHF s/p St. Jude dual chamber ICD 03/2008.   Carotid stenosis    a. Prior carotid dz/ surgery. b. Carotid dopp 07/2011 - 0-39% bilaterally.;   c. Doppler 11/13: 40-59% RICA, 0-39% LICA   Chronic renal insufficiency    Diabetes mellitus    GI bleed    Reported history   Hx of CABG    Hypertension    [place] [facility]    Mitral regurgitation    Myocardial infarction [facility])    Neuromuscular disorder (HCC)    R sided weakness    Osteoarthritis    PAF (paroxysmal atrial fibrillation) (HCC)    a. Poor Coumadin candidate due to hx of GI bleed.   Sinus bradycardia    Sleep apnea    study done, 10 yrs ago, unable to tolerate CPAP    Splenomegaly    Stroke [facility])    a. CVA 1990s - chronic pain in RUE after stroke.   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   Acute appendicitis, uncomplicated 09/14/2017   Dry eye 02/20/2016   CHF (congestive heart failure) (HCC)    Elevated LFTs 02/13/2016   Hypokalemia 02/13/2016   Chronic combined systolic and diastolic CHF (congestive heart failure) (HCC) 02/13/2016   Prolonged Q-T interval on ECG 02/13/2016   Biliary colic 02/13/2016   Left ear pain 02/13/2016   Thrombocytopenia (HCC) 02/13/2016   Right knee pain 10/13/2013   Abnormality of gait 10/13/2013   Stiffness of right knee 10/13/2013   Arthritis of knee, right 08/16/2013   Cholecystitis with cholelithiasis 03/07/2013   Paroxysmal atrial fibrillation (HCC) 03/13/2009   Automatic implantable cardioverter-defibrillator in situ  03/13/2009   DM (diabetes mellitus), secondary, with peripheral vascular complications (HCC) 03/12/2009   Iron  deficiency anemia 03/12/2009   Essential hypertension 03/12/2009   Nonischemic cardiomyopathy (HCC) 03/12/2009   CEREBROVASCULAR DISEASE 03/12/2009   PVD 03/12/2009   RENAL INSUFFICIENCY, CHRONIC 03/12/2009   OSTEOARTHRITIS 03/12/2009   CORONARY ARTERY BYPASS GRAFT, HX OF 03/12/2009    Past Surgical History:  Procedure Laterality Date   CARDIAC DEFIBRILLATOR PLACEMENT  04/05/2008   Implantation of a St. Jude dual-chamber defibrillator, Stevens Eland. Carolynne Citron , MD   CARDIAC DEFIBRILLATOR PLACEMENT     CHOLECYSTECTOMY N/A 02/19/2016   Procedure: LAPAROSCOPIC  CHOLECYSTECTOMY;  Surgeon: Oza Blumenthal, MD;  Location: [facility] OR;  Service: General;  Laterality: N/A;   COLONOSCOPY  07/2007   Rourk: left-sided diverticulum. TI normal. No etiology for IDA.   CORONARY ARTERY BYPASS GRAFT     CORONARY ARTERY BYPASS GRAFT  08/29/2007   x3; Dominick Fries H. Alva Jewels MD   EP IMPLANTABLE DEVICE N/A 10/05/2015   Procedure: ICD Generator Changeout;  Surgeon: Tammie Fall, MD;  Location: [facility] INVASIVE CV LAB;  Service: Cardiovascular;  Laterality: N/A;   ERCP N/A 02/18/2016   Procedure: ENDOSCOPIC RETROGRADE CHOLANGIOPANCREATOGRAPHY (ERCP);  Surgeon: Ozell Blunt, MD;  Location: [facility] ENDOSCOPY;  Service: Endoscopy;  Laterality: N/A;   ESOPHAGOGASTRODUODENOSCOPY  07/2007   Rourk: small hh, atrophic gastric mucosa, poor distensibility of stomach ?extrinsic compression, CT showed mild to moderate splenomegaly. No etiology for IDA.   ICD GENERATOR CHANGEOUT N/A 02/16/2024   Procedure: ICD GENERATOR CHANGEOUT;  Surgeon: Tammie Fall, MD;  Location: [facility] INVASIVE CV LAB;  Service: Cardiovascular;  Laterality: N/A;   JOINT REPLACEMENT  1990's   L knee   LAPAROSCOPIC APPENDECTOMY N/A 09/14/2017   Procedure: APPENDECTOMY LAPAROSCOPIC;  Surgeon: Jerryl Morin, MD;  Location: [facility] OR;  Service: General;  Laterality: N/A;   Percutaneous coronary intervention  01/10/2005   Lavina Pou, MD   TONSILLECTOMY     TOTAL KNEE ARTHROPLASTY Right 08/16/2013   Procedure: RIGHT TOTAL KNEE ARTHROPLASTY;  Surgeon: Arnie Lao, MD;  Location: [facility] OR;  Service: Orthopedics;  Laterality: Right;       Home Medications    Prior to Admission medications   Medication Sig Start Date End Date Taking? Authorizing Provider  acetaminophen  (TYLENOL ) 500 MG tablet Take 1 tablet (500 mg total) by mouth every 6 (six) hours as needed. 12/22/21   Horton, Vonzella Guernsey, MD  atorvastatin  (LIPITOR ) 40 MG tablet Take 40 mg by mouth daily. 07/16/20   [provider]  baclofen  (LIORESAL ) 20 MG tablet Take  40 mg by mouth at bedtime.     [provider]  carvedilol  (COREG ) 25 MG tablet Take 1 tablet (25 mg total) by mouth 2 (two) times daily with a meal. Patient taking differently: Take 12.5 mg by mouth 2 (two) times daily. 09/12/20   Tammie Fall, MD  cetirizine (ZYRTEC) 10 MG tablet Take 10 mg by mouth daily.    [provider]  clobetasol cream (TEMOVATE) 0.05 % Apply 1 Application topically 2 (two) times daily as needed (rash).    [provider]  DULoxetine  (CYMBALTA ) 30 MG capsule Take 30 mg by mouth daily.    [provider]  furosemide  (LASIX ) 20 MG tablet Take 1 tablet (20 mg total) by mouth 2 (two) times daily. 06/06/22   Johnson, Clanford L, MD  glimepiride  (AMARYL ) 4 MG tablet Take 1 tablet (4 mg total) by mouth daily with breakfast. Patient taking differently: Take 4  mg by mouth 2 (two) times daily. 06/05/22   Johnson, Clanford L, MD  meclizine  (ANTIVERT ) 25 MG tablet Take 25 mg by mouth 4 (four) times daily as needed for dizziness. 02/26/12   [provider]  metFORMIN (GLUCOPHAGE) 500 MG tablet Take 500 mg by mouth daily with breakfast. 08/26/20   [provider]  ondansetron  (ZOFRAN ) 4 MG tablet Take 4 mg by mouth every 8 (eight) hours as needed. 12/15/23   [provider]  oxyCODONE -acetaminophen  (PERCOCET/ROXICET) 5-325 MG tablet Take 0.5 tablets by mouth every 6 (six) hours as needed for moderate pain (pain score 4-6). 04/18/19   [provider]  promethazine -dextromethorphan (PROMETHAZINE -DM) 6.25-15 MG/5ML syrup Take 5 mLs by mouth every 6 (six) hours as needed. 12/09/23   [provider]  rivaroxaban  (XARELTO ) 20 MG TABS tablet TAKE 1 TABLET BY MOUTH ONCE DAILY WITH SUPPER 12/30/23   Tammie Fall, MD  SOLIQUA 100-33 UNT-MCG/ML SOPN Inject 38 Units into the skin daily. 07/17/20   [provider]  tamsulosin (FLOMAX) 0.4 MG CAPS capsule Take 0.4 mg by mouth daily.    [provider]     Family History Family History  Problem Relation Age of Onset   Heart attack Mother        CVA, MI   Diabetes Mother    Heart attack Father        MI   Diabetes Sister    Coronary artery disease Brother        CABG    Social History Social History   Tobacco Use   Smoking status: Former    Current packs/day: 0.00    Average packs/day: 1 pack/day for 15.0 years (15.0 ttl pk-yrs)    Types: Cigarettes    Start date: 09/30/1975    Quit date: 09/29/1990    Years since quitting: 33.4   Smokeless tobacco: Former    Quit date: 08/09/1988  Vaping Use   Vaping status: Never Used  Substance Use Topics   Alcohol use: No   Drug use: No     Allergies   Codeine   Review of Systems Review of Systems Per HPI  Physical Exam Triage Vital Signs ED Triage Vitals  Encounter Vitals Group     BP 03/04/24 1124 (!) 161/82     Systolic BP Percentile --      Diastolic BP Percentile --      Pulse Rate 03/04/24 1124 63     Resp 03/04/24 1124 18     Temp 03/04/24 1124 97.6 F (36.4 C)     Temp Source 03/04/24 1124 Oral     SpO2 03/04/24 1124 96 %     Weight --      Height --      Head Circumference --      Peak Flow --      Pain Score 03/04/24 1119 8     Pain Loc --      Pain Education --      Exclude from Growth Chart --    No data found.  Updated Vital Signs BP (!) 161/82 (BP Location: Left Arm)   Pulse 63   Temp 97.6 F (36.4 C) (Oral)   Resp 18   SpO2 96%   Visual Acuity Right Eye Distance:   Left Eye Distance:   Bilateral Distance:    Right Eye Near:   Left Eye Near:    Bilateral Near:     Physical Exam Vitals and nursing note reviewed.  Constitutional:      General: He is not in acute distress.    Appearance: Normal appearance.  Eyes:     Extraocular Movements: Extraocular movements intact.     Pupils: Pupils are equal, round, and reactive to light.  Musculoskeletal:     Left shoulder: Tenderness (AC joint) present. No swelling or deformity.  Decreased range of motion. Normal strength. Normal pulse.     Cervical back: Normal range of motion.  Skin:    General: Skin is warm and dry.  Neurological:     General: No focal deficit present.     Mental Status: He is alert and oriented to person, place, and time.  Psychiatric:        Mood and Affect: Mood normal.        Behavior: Behavior normal.      UC Treatments / Results  Labs (all labs ordered are listed, but only abnormal results are displayed) Labs Reviewed - No data to display  EKG   Radiology No results found.  Procedures Procedures (including critical care time)  Medications Ordered in UC Medications - No data to display  Initial Impression / Assessment and Plan / UC Course  I have reviewed the triage vital signs and the nursing notes.  Pertinent labs & imaging results that were available during my care of the patient were reviewed by me and considered in my medical decision making (see chart for details).  On exam, patient with tenderness to the [facility] joint of the left shoulder.  There is no obvious bruising, swelling, or deformity present.  Will forego imaging as patient has not had a traumatic injury.  Symptoms consistent with a sprain/strain of the shoulder.  Will start tizanidine 2 mg at bedtime for pain and spasm.  Supportive care recommendations were provided and discussed with the patient to include range of motion exercises, the use of ice or heat, and over-the-counter analgesics.  Discussed indications with patient regarding follow-up.  Patient agreement with this plan of care and verbalizes understanding.  All questions were answered.  Patient stable for discharge.  Final Clinical Impressions(s) / UC Diagnoses   Final diagnoses:  None   Discharge Instructions   None    ED Prescriptions   None    PDMP not reviewed this encounter.   Hardy Lia, NP 03/04/24 1202

## 2024-03-04 NOTE — Discharge Instructions (Addendum)
 Take medication as prescribed. Increase fluids and allow for plenty of rest. You may take over-the-counter Tylenol  as needed for pain or discomfort. Recommend use of ice or heat.  Apply ice for pain or swelling, heat for spasm or stiffness.  Apply for 20 minutes, remove for 1 hour, repeat as needed. Gentle range of motion exercises of the left shoulder while symptoms persist. As discussed, if symptoms have not improved over the next 2 to 3 weeks, or appear to worsen, recommend follow-up with orthopedics for further evaluation. Follow-up as needed.

## 2024-03-04 NOTE — ED Triage Notes (Signed)
 Pt presents to UC for left shoulder pain starting last night after he was on his lawnmower and it "jerked his shoulder while changing gears." Pt denies falling. Took prescribed pain med w/o relief.

## 2024-03-06 NOTE — Progress Notes (Deleted)
 Cardiology Office Note:  .   Date:  03/06/2024  ID:  [name], DOB [birth year], MRN [id number] PCP: Omie Bickers, MD  [facility] Providers Cardiologist:  None Electrophysiologist:  Manya Sells, MD {  History of Present Illness: [name] is a 78 y.o. male w/PMHx of  HTN, HLD, DM, stroke, GIB CAD (MI, PCI 2006, CABG 2008) ICM, Chronic CHF (systolic) AFib  He saw Dr. Carolynne Citron Oct 2024, (seeing him annually), discussed atrial undersensing on his remote monitor with PAC's in the PVARP.  P waves are 1 and sensitivity is set at 0.5. we reduce his PVARP as he did not have much VA conduction.  reprogramming of his device from VVI to DDD with a long AV delay   Subsequently device reached ERI Gen change 02/16/24  Today's visit is scheduled as his wound check visit ROS:   *** RBBB EKG *** last echo 2020 30-35% *** gen cards??? Dr. Danise Durie historically  *** on coreg , lasix  >>> needs more *** xarelto , dose, bleeding, labs  Device information STJ dual chamber ICD implanted 04/05/2008, gen change 10/05/2015, 02/16/24  Arrhythmia/AAD hx AFib 2010 Amiodarone remotely >> stopped 2/2 marked QT prolongation and elevated LFTs (2012)  Studies Reviewed: Aaron Aas    EKG not done today  DEVICE interrogation done today and reviewed by myself *** Battery and lead measurements are good ***   01/15/2019: TTE  1. The left ventricle has moderate-severely reduced systolic function,  with an ejection fraction of 30-35%. The cavity size was normal. Left  ventricular diastolic Doppler parameters are indeterminate.   2. The anteroseptal wall is hypokinetic. The apex and anterior walls are  akinetic. The mid to distal inferoseptal wall is hypokinetic.   3. Left atrial size was moderately dilated.   4. Right atrial size was moderately dilated.   5. The aortic valve is tricuspid. Mild thickening of the aortic valve.  Mild calcification of the aortic valve. No stenosis of the aortic valve.   Mild aortic annular calcification noted.   6. The mitral valve is abnormal. Mild thickening of the mitral valve  leaflet. Mild calcification of the mitral valve leaflet. There is mild  mitral annular calcification present. No evidence of mitral valve  stenosis.   7. Tricuspid valve regurgitation is mild-moderate.   8. The aorta is normal in size and structure.   9. The aortic root is normal in size and structure.  10. Pulmonary hypertension is is not present, PASP is 27 mmHg.  11. The inferior vena cava was dilated in size with >50% respiratory  variability.     Risk Assessment/Calculations:    Physical Exam:   VS:  There were no vitals taken for this visit.   Wt Readings from Last 3 Encounters:  02/16/24 215 lb (97.5 kg)  07/09/23 211 lb (95.7 kg)  03/28/23 210 lb (95.3 kg)    GEN: Well nourished, well developed in no acute distress NECK: No JVD; No carotid bruits CARDIAC: ***RRR, no murmurs, rubs, gallops RESPIRATORY:  *** CTA b/l without rales, wheezing or rhonchi  ABDOMEN: Soft, non-tender, non-distended EXTREMITIES: *** No edema; No deformity   ICD site: *** is stable, no thinning, fluctuation, tethering  ASSESSMENT AND PLAN: .    ICD *** site *** intact function *** no programming changes made  paroxysmal AFib CHA2DS2Vasc is 8, on Xarelto  *** appropriately dosed *** %burden  CAD *** Needs to re-establish with gen cardiology team  ICM Chronic CHF *** ***  Needs to re-establish with general cardiology team  Secondary hypercoagulable state 2/2 AFib     {Are you ordering a CV Procedure (e.g. stress test, cath, DCCV, TEE, etc)?   Press F2        :[id number]}     Dispo: ***  Signed, Debbie Fails, PA-C

## 2024-03-08 ENCOUNTER — Ambulatory Visit: Attending: Physician Assistant | Admitting: Physician Assistant

## 2024-03-09 ENCOUNTER — Encounter: Payer: Self-pay | Admitting: Physician Assistant

## 2024-03-16 ENCOUNTER — Telehealth: Payer: Self-pay | Admitting: Internal Medicine

## 2024-03-16 NOTE — Telephone Encounter (Signed)
 Pt calling to check his appt time for tomorrow. I told him I don't show an appt for tomorrow. He said someone called him today told him to come tomorrow. He doesn't know who he spoke to. I also asked Cassie the EP Scheduler and she said she didn't call him.

## 2024-03-16 NOTE — Telephone Encounter (Signed)
 Spoke with patient and he states he had appointment scheduled tomorrow and he is not sure why it was cancelled. He states someone called him and told him to come tomorrow.  I do not see an encounter and patient is aware

## 2024-03-17 ENCOUNTER — Encounter

## 2024-03-17 NOTE — Telephone Encounter (Signed)
 Pts appts were not pushed out. 91 day follow in August as planned.

## 2024-03-24 ENCOUNTER — Ambulatory Visit: Admitting: Internal Medicine

## 2024-04-06 ENCOUNTER — Encounter: Admitting: Internal Medicine

## 2024-04-18 ENCOUNTER — Ambulatory Visit: Admitting: Podiatry

## 2024-04-18 ENCOUNTER — Other Ambulatory Visit: Payer: Self-pay | Admitting: Podiatry

## 2024-04-18 ENCOUNTER — Encounter

## 2024-04-22 ENCOUNTER — Ambulatory Visit: Admitting: Podiatry

## 2024-04-24 ENCOUNTER — Emergency Department (HOSPITAL_COMMUNITY)

## 2024-04-24 ENCOUNTER — Other Ambulatory Visit: Payer: Self-pay

## 2024-04-24 ENCOUNTER — Emergency Department (HOSPITAL_COMMUNITY): Admission: EM | Admit: 2024-04-24 | Discharge: 2024-04-24 | Disposition: A | Discharge status: Home / Self Care

## 2024-04-24 DIAGNOSIS — Z951 Presence of aortocoronary bypass graft: Secondary | ICD-10-CM | POA: Diagnosis not present

## 2024-04-24 DIAGNOSIS — Z7984 Long term (current) use of oral hypoglycemic drugs: Secondary | ICD-10-CM | POA: Insufficient documentation

## 2024-04-24 DIAGNOSIS — E119 Type 2 diabetes mellitus without complications: Secondary | ICD-10-CM | POA: Diagnosis not present

## 2024-04-24 DIAGNOSIS — Z7901 Long term (current) use of anticoagulants: Secondary | ICD-10-CM | POA: Insufficient documentation

## 2024-04-24 DIAGNOSIS — I110 Hypertensive heart disease with heart failure: Secondary | ICD-10-CM | POA: Diagnosis not present

## 2024-04-24 DIAGNOSIS — R110 Nausea: Secondary | ICD-10-CM | POA: Diagnosis not present

## 2024-04-24 DIAGNOSIS — R42 Dizziness and giddiness: Secondary | ICD-10-CM | POA: Diagnosis present

## 2024-04-24 DIAGNOSIS — Z8673 Personal history of transient ischemic attack (TIA), and cerebral infarction without residual deficits: Secondary | ICD-10-CM | POA: Diagnosis not present

## 2024-04-24 DIAGNOSIS — I480 Paroxysmal atrial fibrillation: Secondary | ICD-10-CM | POA: Insufficient documentation

## 2024-04-24 DIAGNOSIS — I2510 Atherosclerotic heart disease of native coronary artery without angina pectoris: Secondary | ICD-10-CM | POA: Diagnosis not present

## 2024-04-24 DIAGNOSIS — I5022 Chronic systolic (congestive) heart failure: Secondary | ICD-10-CM | POA: Diagnosis not present

## 2024-04-24 DIAGNOSIS — Z79899 Other long term (current) drug therapy: Secondary | ICD-10-CM | POA: Diagnosis not present

## 2024-04-24 LAB — COMPREHENSIVE METABOLIC PANEL WITH GFR
ALT: 12 U/L (ref 0–44)
AST: 19 U/L (ref 15–41)
Albumin: 3.9 g/dL (ref 3.5–5.0)
Alkaline Phosphatase: 77 U/L (ref 38–126)
Anion gap: 13 (ref 5–15)
BUN: 28 mg/dL — ABNORMAL HIGH (ref 8–23)
CO2: 27 mmol/L (ref 22–32)
Calcium: 8.9 mg/dL (ref 8.9–10.3)
Chloride: 97 mmol/L — ABNORMAL LOW (ref 98–111)
Creatinine, Ser: 1.35 mg/dL — ABNORMAL HIGH (ref 0.61–1.24)
GFR, Estimated: 54 mL/min — ABNORMAL LOW (ref >60)
Glucose, Bld: 133 mg/dL — ABNORMAL HIGH (ref 70–99)
Potassium: 3 mmol/L — ABNORMAL LOW (ref 3.5–5.1)
Sodium: 137 mmol/L (ref 135–145)
Total Bilirubin: 1 mg/dL (ref 0.0–1.2)
Total Protein: 6.7 g/dL (ref 6.5–8.1)

## 2024-04-24 LAB — URINALYSIS, ROUTINE W REFLEX MICROSCOPIC
Bilirubin Urine: NEGATIVE
Glucose, UA: NEGATIVE mg/dL
Hgb urine dipstick: NEGATIVE
Ketones, ur: NEGATIVE mg/dL
Leukocytes,Ua: NEGATIVE
Nitrite: NEGATIVE
Protein, ur: NEGATIVE mg/dL
Specific Gravity, Urine: 1.008 (ref 1.005–1.030)
pH: 5 (ref 5.0–8.0)

## 2024-04-24 LAB — CBC WITH DIFFERENTIAL/PLATELET
Abs Immature Granulocytes: 0 K/uL (ref 0.00–0.07)
Basophils Absolute: 0 K/uL (ref 0.0–0.1)
Basophils Relative: 1 %
Eosinophils Absolute: 0.2 K/uL (ref 0.0–0.5)
Eosinophils Relative: 3 %
HCT: 40.8 % (ref 39.0–52.0)
Hemoglobin: 12.9 g/dL — ABNORMAL LOW (ref 13.0–17.0)
Immature Granulocytes: 0 %
Lymphocytes Relative: 20 %
Lymphs Abs: 1.3 K/uL (ref 0.7–4.0)
MCH: 27.2 pg (ref 26.0–34.0)
MCHC: 31.6 g/dL (ref 30.0–36.0)
MCV: 86.1 fL (ref 80.0–100.0)
Monocytes Absolute: 0.7 K/uL (ref 0.1–1.0)
Monocytes Relative: 11 %
Neutro Abs: 4.2 K/uL (ref 1.7–7.7)
Neutrophils Relative %: 65 %
Platelets: 219 K/uL (ref 150–400)
RBC: 4.74 MIL/uL (ref 4.22–5.81)
RDW: 15.9 % — ABNORMAL HIGH (ref 11.5–15.5)
WBC: 6.4 K/uL (ref 4.0–10.5)
nRBC: 0 % (ref 0.0–0.2)

## 2024-04-24 LAB — TROPONIN I (HIGH SENSITIVITY): Troponin I (High Sensitivity): 9 ng/L (ref <18)

## 2024-04-24 MED ORDER — POTASSIUM CHLORIDE CRYS ER 20 MEQ PO TBCR
40.0000 meq | EXTENDED_RELEASE_TABLET | Freq: Once | ORAL | Status: AC
  Administered 2024-04-24: 40 meq via ORAL
  Filled 2024-04-24: qty 2

## 2024-04-24 MED ORDER — POTASSIUM CHLORIDE 10 MEQ/100ML IV SOLN
10.0000 meq | Freq: Once | INTRAVENOUS | Status: AC
  Administered 2024-04-24: 10 meq via INTRAVENOUS
  Filled 2024-04-24: qty 100

## 2024-04-24 MED ORDER — SODIUM CHLORIDE 0.9 % IV BOLUS
500.0000 mL | Freq: Once | INTRAVENOUS | Status: AC
  Administered 2024-04-24: 500 mL via INTRAVENOUS

## 2024-04-24 MED ORDER — MECLIZINE HCL 12.5 MG PO TABS
25.0000 mg | ORAL_TABLET | Freq: Once | ORAL | Status: AC
  Administered 2024-04-24: 25 mg via ORAL
  Filled 2024-04-24: qty 2

## 2024-04-24 MED ORDER — IOHEXOL 350 MG/ML SOLN
75.0000 mL | Freq: Once | INTRAVENOUS | Status: AC | PRN
  Administered 2024-04-24: 75 mL via INTRAVENOUS

## 2024-04-24 MED ORDER — LACTATED RINGERS IV BOLUS: 500.0000 mL | Freq: Once | INTRAVENOUS | Status: DC

## 2024-04-24 NOTE — Discharge Instructions (Addendum)
 You were evaluated in the emergency room for dizziness.  Your lab work and imaging did not show any significant abnormality.  You are provided a referral for neurology.  You should be receiving a call for scheduled appointment.

## 2024-04-24 NOTE — ED Notes (Signed)
 See triage notes. States dizziness worse with movement or lying down. Denies vision changes. Denies new pain or headache. Pt a/o. Symptoms over 3 days now. Intermittent nausea. Ble swelling noted but daughter at bedside states they look better than  usual at this time. Denies SHOB. Nad at this time. Pupils perrla. Moving all extremities.

## 2024-04-24 NOTE — ED Triage Notes (Signed)
 Pt c/o dizziness x 3 days. Pt c/o some nausea with it.

## 2024-04-24 NOTE — ED Notes (Signed)
Taken to ct. nad 

## 2024-04-24 NOTE — ED Provider Notes (Signed)
 [place] EMERGENCY DEPARTMENT AT [facility] Provider Note   CSN: [id number] Arrival date & time: 04/24/24  1604     Patient presents with: Dizziness   [name] is a 78 y.o. male with history of of diabetes, A-fib on Xarelto , hypertension, CHF, CVA, vertebral artery obstruction, cardiomyopathy with AICD device presents with complaints of dizziness described as room spinning feeling swimmy headed.  The symptoms have been ongoing for over a year although they have been worsening the past few days.  Not associated with any chest pain, shortness of breath.  No syncopal episodes.  No headache, new numbness or weakness.  Symptoms are episodic and worse with movement.  He does endorse nausea without vomiting, no abdominal pain.  No bloody stools.  No URI symptoms.   Dizziness  Past Medical History:  Diagnosis Date   Anemia    Multifactorial  - hx of B12 def and iron  deficiency, followed by heme, followed at Cancer [facility]- [facility]   Automatic implantable cardioverter-defibrillator in situ    CAD (coronary artery disease)    a. STEMI s/p CABG 07/2007 (had preop cardiogenic shock, IABP, VDRF before surgery)   Cardiomyopathy, ischemic    a. Chronic systolic CHF s/p [employer] Jude dual chamber ICD 03/2008.   Carotid stenosis    a. Prior carotid dz/ surgery. b. Carotid dopp 07/2011 - 0-39% bilaterally.;   c. Doppler 11/13: 40-59% RICA, 0-39% LICA   Chronic renal insufficiency    Diabetes mellitus    GI bleed    Reported history   Hx of CABG    Hypertension    [facility]    Mitral regurgitation    Myocardial infarction [facility])    Neuromuscular disorder (HCC)    R sided weakness    Osteoarthritis    PAF (paroxysmal atrial fibrillation) (HCC)    a. Poor Coumadin candidate due to hx of GI bleed.   Sinus bradycardia    Sleep apnea    study done, 10 yrs ago, unable to tolerate CPAP   Splenomegaly    Stroke [facility])    a. CVA 1990s - chronic pain in RUE after stroke.    Systolic CHF, chronic (HCC)    Previously taken off lisinopril  by primary doctor due to labs   Tobacco abuse        Prior to Admission medications   Medication Sig Start Date End Date Taking? Authorizing Provider  albuterol (VENTOLIN HFA) 108 (90 Base) MCG/ACT inhaler Inhale 2 puffs into the lungs every 4 (four) hours as needed for wheezing or shortness of breath.   Yes [provider]  atorvastatin  (LIPITOR ) 40 MG tablet Take 40 mg by mouth daily. 07/16/20  Yes [provider]  Azelastine HCl 137 MCG/SPRAY SOLN Place 1 spray into both nostrils 2 (two) times daily as needed (allergies). 03/16/24  Yes [provider]  baclofen  (LIORESAL ) 20 MG tablet Take 40 mg by mouth at bedtime.    Yes [provider]  carvedilol  (COREG ) 25 MG tablet Take 1 tablet (25 mg total) by mouth 2 (two) times daily with a meal. Patient taking differently: Take 12.5 mg by mouth 2 (two) times daily. 09/12/20  Yes Waddell Danelle ORN, MD  DULoxetine  (CYMBALTA ) 30 MG capsule Take 30 mg by mouth daily.   Yes [provider]  furosemide  (LASIX ) 20 MG tablet Take 1 tablet (20 mg total) by mouth 2 (two) times daily. 06/06/22  Yes Johnson, Clanford L, MD  glimepiride  (AMARYL ) 4 MG  tablet Take 1 tablet (4 mg total) by mouth daily with breakfast. Patient taking differently: Take 4 mg by mouth 2 (two) times daily. 06/05/22  Yes Johnson, Clanford L, MD  hydrochlorothiazide (HYDRODIURIL) 25 MG tablet Take 25 mg by mouth daily.   Yes [provider]  meclizine  (ANTIVERT ) 25 MG tablet Take 25 mg by mouth 4 (four) times daily as needed for dizziness. 02/26/12  Yes [provider]  ondansetron  (ZOFRAN ) 4 MG tablet Take 4 mg by mouth every 8 (eight) hours as needed for nausea or vomiting. 12/15/23  Yes [provider]  oxyCODONE -acetaminophen  (PERCOCET/ROXICET) 5-325 MG tablet Take 0.5 tablets by mouth every 6 (six) hours as needed for moderate pain (pain score 4-6). 04/18/19   Yes [provider]  rivaroxaban  (XARELTO ) 20 MG TABS tablet TAKE 1 TABLET BY MOUTH ONCE DAILY WITH SUPPER 12/30/23  Yes Waddell Danelle ORN, MD  SOLIQUA 100-33 UNT-MCG/ML SOPN Inject 38 Units into the skin daily. 07/17/20  Yes [provider]  tamsulosin (FLOMAX) 0.4 MG CAPS capsule Take 0.4 mg by mouth daily.   Yes [provider]    Allergies: Codeine    Review of Systems  Neurological:  Positive for dizziness.    Updated Vital Signs BP (!) 143/67   Pulse 63   Temp 98 F (36.7 C) (Oral)   Resp 17   Ht 6' 2 (1.88 m)   Wt 95.3 kg   SpO2 99%   BMI 26.96 kg/m   Physical Exam Vitals and nursing note reviewed.  Constitutional:      General: He is not in acute distress.    Appearance: He is well-developed.  HENT:     Head: Normocephalic and atraumatic.  Eyes:     Conjunctiva/sclera: Conjunctivae normal.  Cardiovascular:     Rate and Rhythm: Normal rate and regular rhythm.     Heart sounds: No murmur heard. Pulmonary:     Effort: Pulmonary effort is normal. No respiratory distress.     Breath sounds: Normal breath sounds.  Abdominal:     Palpations: Abdomen is soft.     Tenderness: There is no abdominal tenderness.  Musculoskeletal:        General: No swelling.     Cervical back: Neck supple.  Skin:    General: Skin is warm and dry.     Capillary Refill: Capillary refill takes less than 2 seconds.  Neurological:     Mental Status: He is alert.     Comments: Patient is alert and oriented. There is no abnormal phonation. Symmetric smile without facial droop.  Moves all extremities spontaneously. 5/5 strength in upper and lower extremities. . No sensation deficit. There is no nystagmus. EOMI, PERRL. Coordination intact with finger to nose and normal ambulation.    Psychiatric:        Mood and Affect: Mood normal.     (all labs ordered are listed, but only abnormal results are displayed) Labs Reviewed  CBC WITH DIFFERENTIAL/PLATELET - Abnormal;  Notable for the following components:      Result Value   Hemoglobin 12.9 (*)    RDW 15.9 (*)    All other components within normal limits  URINALYSIS, ROUTINE W REFLEX MICROSCOPIC - Abnormal; Notable for the following components:   Color, Urine STRAW (*)    All other components within normal limits  COMPREHENSIVE METABOLIC PANEL WITH GFR - Abnormal; Notable for the following components:   Potassium 3.0 (*)    Chloride 97 (*)  Glucose, Bld 133 (*)    BUN 28 (*)    Creatinine, Ser 1.35 (*)    GFR, Estimated 54 (*)    All other components within normal limits  TROPONIN I (HIGH SENSITIVITY)  TROPONIN I (HIGH SENSITIVITY)    EKG: None  Radiology: CT ANGIO HEAD NECK W WO CM Result Date: 04/24/2024 CLINICAL DATA:  Vertigo, central.  Dizziness for 3 days.  Nausea. EXAM: CT ANGIOGRAPHY HEAD AND NECK WITH AND WITHOUT CONTRAST TECHNIQUE: Multidetector CT imaging of the head and neck was performed using the standard protocol during bolus administration of intravenous contrast. Multiplanar CT image reconstructions and MIPs were obtained to evaluate the vascular anatomy. Carotid stenosis measurements (when applicable) are obtained utilizing NASCET criteria, using the distal internal carotid diameter as the denominator. RADIATION DOSE REDUCTION: This exam was performed according to the departmental dose-optimization program which includes automated exposure control, adjustment of the mA and/or kV according to patient size and/or use of iterative reconstruction technique. CONTRAST:  75mL OMNIPAQUE  IOHEXOL  350 MG/ML SOLN COMPARISON:  CT head 10/17/2023.  CTA head and neck 06/01/2022. FINDINGS: CT HEAD FINDINGS Brain: There is no evidence of an acute infarct, intracranial hemorrhage, mass, midline shift, or extra-axial fluid collection. Chronic infarcts are again noted in the medial left temporal lobe and left thalamus with associated ex vacuo dilatation of the left lateral ventricle. There is mild  cerebral atrophy. Vascular: Calcified atherosclerosis at the skull base. No hyperdense vessel. Skull: No fracture or suspicious lesion. Sinuses/Orbits: Chronic right sphenoid sinusitis. Clear mastoid air cells. Unremarkable orbits. Other: None. Review of the MIP images confirms the above findings CTA NECK FINDINGS Aortic arch: Standard branching with mild atherosclerosis. No significant stenosis of the arch vessel origins. Right carotid system: Patent with soft and calcified plaque at the carotid bifurcation resulting in less than 50% stenosis of the ICA origin. Left carotid system: Patent with soft and calcified plaque at the carotid bifurcation not resulting in a significant stenosis. Vertebral arteries: Proximally 65% stenosis of the right subclavian artery origin, similar to the prior CTA. Patent and strongly dominant right vertebral artery with limited assessment of portions of its V1 segment due to streak artifact but no evidence of a significant stenosis more distally. 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 Skeleton: Moderate disc degeneration at C5-6. Advanced multilevel cervical facet arthrosis, overall worse on the left. Other neck: No evidence of cervical lymphadenopathy or mass. Upper chest: Clear lung apices. Review of the MIP images confirms the above findings CTA HEAD FINDINGS Anterior circulation: The internal carotid arteries are patent from skull base to carotid termini with mild-to-moderate atherosclerosis not resulting in a significant stenosis. ACAs and MCAs are patent without evidence of a proximal branch occlusion or significant proximal stenosis. No aneurysm is identified. Posterior circulation: The intracranial vertebral arteries are patent to the basilar with the right being strongly dominant. A severe stenosis of the left V4 segment near the PICA origin is stable to minimally progressed.  The basilar artery is widely patent. Posterior communicating arteries are diminutive or absent. Both PCAs are patent without evidence of a significant proximal stenosis. No aneurysm is identified. Venous sinuses: Patent. Anatomic variants: None. Review of the MIP images confirms the above findings IMPRESSION: 1. No evidence of acute intracranial abnormality or acute large vessel occlusion. 2. 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 Severe left V4  stenosis. 3. Widely patent and strongly dominant right vertebral artery. 4. 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 5. Cervical carotid atherosclerosis without significant stenosis. 6.  Aortic Atherosclerosis (ICD10-I70.0). Electronically Signed   By: Dasie Hamburg M.D.   On: 04/24/2024 19:36     Procedures   Medications Ordered in the ED  potassium chloride  10 mEq in 100 mL IVPB (10 mEq Intravenous New Bag/Given 04/24/24 1914)  meclizine  (ANTIVERT ) tablet 25 mg (25 mg Oral Given 04/24/24 1724)  iohexol  (OMNIPAQUE ) 350 MG/ML injection 75 mL (75 mLs Intravenous Contrast Given 04/24/24 1835)  potassium chloride  SA (KLOR-CON  M) CR tablet 40 mEq (40 mEq Oral Given 04/24/24 1913)    Clinical Course as of 04/24/24 2010  Sun Apr 24, 2024  1631 EKG not crossing over into MUSE, interpreted by me in the absence of cardiology and shows sinus rhythm, rate 60, prolonged PR intervals, RBBB, some ST depressions in the anterior and lateral leads [MK]    Clinical Course User Index [MK] Gennaro Duwaine CROME, DO                                 Medical Decision Making Amount and/or Complexity of Data Reviewed Labs: ordered. Radiology: ordered.  Risk Prescription drug management.   This patient presents to the ED with chief complaint(s) of Dizziness .  The complaint involves an extensive differential diagnosis and also carries with it a high risk of complications and morbidity.   Pertinent past medical  history as listed in HPI  The differential diagnosis includes  CVA, TIA, peripheral vertigo, anemia, ACS Additional history obtained: Additional history obtained from family Records reviewed Care Everywhere/External Records  Assessment and management:   Hemodynamically stable, nontoxic-appearing patient presenting with complaints of acute on chronic dizziness described as room spinning.  Has had no syncopal episodes.  No chest pain, shortness of breath, headache, numbness, tingling, extremity weakness, bloody stools, URI symptoms.  Symptoms are episodic and worse with head movement.  Overall history is most concerning for peripheral vertigo.  He does appear that he had a ED visit in 2023 with similar complaints.  He does CT angio head and neck which was negative.  He does have a AICD which is apparently MRI incompatible.  Given significant risk factors we will repeat CT angio head and neck here today.  EKG appears to be unchanged.  Workup overall reassuring.  Overall most concerning for peripheral vertigo.  Patient has meclizine  at home.  Will send in neurology follow-up.  Family is comfortable with plan.  Independent ECG interpretation:  Sinus rhythm, paced, RBBB, prolonged PR with some ST depressions in anterior lateral leads, unchanged  Independent labs interpretation:  The following labs were independently interpreted:  CBC without significant abnormality, UA without nitrites, leukocytes or hemoglobin, troponin without elevation, CMP with potassium of 3, creatinine 1.35  Independent visualization and interpretation of imaging: I independently visualized the following imaging with scope of interpretation limited to determining acute life threatening conditions related to emergency care:  CT angio head and neck no evidence of acute intracranial abnormality or acute large vessel occlusion.  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 The patient has been appropriately medically screened and/or stabilized in the ED. I have low suspicion for any other emergent medical condition which would require further  screening, evaluation or treatment in the ED or require inpatient management. At time of discharge the patient is hemodynamically stable and in no acute distress. I have discussed work-up results and diagnosis with patient and answered all questions. Patient is agreeable with discharge plan. We discussed strict return precautions for returning to the emergency department and they verbalized understanding.     Social Determinants of Health:   none  This note was dictated with voice recognition software.  Despite best efforts at proofreading, errors may have occurred which can change the documentation meaning.       Final diagnoses:  Dizziness    ED Discharge Orders          Ordered    Ambulatory referral to Neurology       Comments: An appointment is requested in approximately: 2 weeks   04/24/24 2008               Donnajean Lynwood VEAR DEVONNA 04/24/24 2011    Gennaro Bouchard L, DO 04/25/24 2004

## 2024-04-26 ENCOUNTER — Ambulatory Visit

## 2024-04-26 NOTE — Patient Instructions (Incomplete)
   After Your ICD (Implantable Cardiac Defibrillator)     The number for the device clinic is:  [phone] if you should have any questions or concerns.   Your ICD is designed to protect you from life threatening heart rhythms. Because of this, you may receive a shock.   1 shock with no symptoms:  Call the office during business hours. 1 shock with symptoms (chest pain, chest pressure, dizziness, lightheadedness, shortness of breath, overall feeling unwell):  Call 911. If you experience 2 or more shocks in 24 hours:  Call 911. If you receive a shock, you should not drive.  [place] DMV - no driving for 6 months if you receive appropriate therapy from your ICD.   ICD Alerts:  Some alerts are vibratory and others beep. These are NOT emergencies. Please call our office to let us  know. If this occurs at night or on weekends, it can wait until the next business day. Send a remote transmission.  If your device is capable of reading fluid status (for heart failure), you will be offered monthly monitoring to review this with you.   Remote monitoring is used to monitor your ICD from home. This monitoring is scheduled every 91 days by our office. It allows us  to keep an eye on the functioning of your device to ensure it is working properly. You will routinely see your Electrophysiologist annually (more often if necessary).

## 2024-04-27 ENCOUNTER — Ambulatory Visit: Attending: Cardiology

## 2024-04-27 DIAGNOSIS — I255 Ischemic cardiomyopathy: Secondary | ICD-10-CM | POA: Diagnosis not present

## 2024-04-27 LAB — CUP PACEART INCLINIC DEVICE CHECK
Battery Remaining Longevity: 102 mo
Brady Statistic RA Percent Paced: 74 %
Brady Statistic RV Percent Paced: 20 %
Date Time Interrogation Session: 20250730125158
HighPow Impedance: 61.875
Implantable Lead Connection Status: 753985
Implantable Lead Connection Status: 753985
Implantable Lead Implant Date: 20090708
Implantable Lead Implant Date: 20090708
Implantable Lead Location: 753859
Implantable Lead Location: 753860
Implantable Lead Model: 7122
Implantable Pulse Generator Implant Date: 20250520
Lead Channel Impedance Value: 350 Ohm
Lead Channel Impedance Value: 375 Ohm
Lead Channel Pacing Threshold Amplitude: 0.5 V
Lead Channel Pacing Threshold Amplitude: 1.5 V
Lead Channel Pacing Threshold Pulse Width: 0.5 ms
Lead Channel Pacing Threshold Pulse Width: 0.5 ms
Lead Channel Sensing Intrinsic Amplitude: 0.9 mV
Lead Channel Sensing Intrinsic Amplitude: 9.4 mV
Lead Channel Setting Pacing Amplitude: 1.5 V
Lead Channel Setting Pacing Amplitude: 1.75 V
Lead Channel Setting Pacing Pulse Width: 0.5 ms
Lead Channel Setting Sensing Sensitivity: 0.5 mV
Pulse Gen Serial Number: 111071983

## 2024-04-27 NOTE — Patient Instructions (Signed)
   After Your ICD (Implantable Cardiac Defibrillator)    Call the device clinic at [phone] if you have any questions or concerns.   Your ICD is designed to protect you from life threatening heart rhythms. Because of this, you may receive a shock.   1 shock with no symptoms:  Call the office during business hours. 1 shock with symptoms (chest pain, chest pressure, dizziness, lightheadedness, shortness of breath, overall feeling unwell):  Call 911. If you experience 2 or more shocks in 24 hours:  Call 911. If you receive a shock, you should not drive.  [place] DMV - no driving for 6 months if you receive appropriate therapy from your ICD.   ICD Alerts:  Some alerts are vibratory and others beep. These are NOT emergencies. Please call our office to let us  know. If this occurs at night or on weekends, it can wait until the next business day. Send a remote transmission.  If your device is capable of reading fluid status (for heart failure), you will be offered monthly monitoring to review this with you.   Remote monitoring is used to monitor your ICD from home. This monitoring is scheduled every 91 days by our office. It allows us  to keep an eye on the functioning of your device to ensure it is working properly. You will routinely see your Electrophysiologist annually (more often if necessary).

## 2024-04-27 NOTE — Progress Notes (Signed)
 Normal dual chamber ICD wound check. Wound well healed. Presenting rhythm: AP/VS 63 . Routine testing performed. Thresholds, sensing, and impedances consistent with implant measurements with auto capture until 3 month visit. No changes in programming made today.  No treated arrhythmias. Corvue showed recent 15 days of fluid retention; however, has currently resolved.  Patient denies any symptoms of generalized edema, no SOB with exertion, feels he is doing well.   No arm restrictions due to chronic leads, gen change only. Reviewed shock plan.  Pt enrolled in remote follow-up.

## 2024-04-28 ENCOUNTER — Encounter: Payer: Self-pay | Admitting: Podiatry

## 2024-04-28 ENCOUNTER — Other Ambulatory Visit: Payer: Self-pay

## 2024-04-28 ENCOUNTER — Ambulatory Visit (INDEPENDENT_AMBULATORY_CARE_PROVIDER_SITE_OTHER): Admitting: Podiatry

## 2024-04-28 DIAGNOSIS — E1151 Type 2 diabetes mellitus with diabetic peripheral angiopathy without gangrene: Secondary | ICD-10-CM | POA: Diagnosis not present

## 2024-04-28 DIAGNOSIS — B351 Tinea unguium: Secondary | ICD-10-CM | POA: Diagnosis not present

## 2024-04-28 DIAGNOSIS — E1351 Other specified diabetes mellitus with diabetic peripheral angiopathy without gangrene: Secondary | ICD-10-CM

## 2024-04-28 DIAGNOSIS — E538 Deficiency of other specified B group vitamins: Secondary | ICD-10-CM

## 2024-04-28 DIAGNOSIS — D509 Iron deficiency anemia, unspecified: Secondary | ICD-10-CM

## 2024-04-28 DIAGNOSIS — I739 Peripheral vascular disease, unspecified: Secondary | ICD-10-CM

## 2024-04-28 NOTE — Progress Notes (Signed)
 This patient returns to my office for at risk foot care.  This patient requires this care by a professional since this patient will be at risk due to having diabetes PVD and thrombocytopenia.  patient is unable to cut nails himself since the patient cannot reach his nails.These nails are painful walking and wearing shoes.  This patient presents for at risk foot care today.  General Appearance  Alert, conversant and in no acute stress.  Vascular  Dorsalis pedis and posterior tibial  pulses are  weakly palpable  bilaterally.  Capillary return is within normal limits  bilaterally. Cold feet. bilaterally.  Neurologic  Senn-Weinstein monofilament wire test within normal limits  bilaterally. Muscle power within normal limits bilaterally.  Nails Thick disfigured discolored nails with subungual debris  from hallux to fifth toes bilaterally. No evidence of bacterial infection or drainage bilaterally.  Orthopedic  No limitations of motion  feet .  No crepitus or effusions noted.  No bony pathology or digital deformities noted.  Skin  normotropic skin with no porokeratosis noted bilaterally.  No signs of infections or ulcers noted.     Onychomycosis  Pain in right toes  Pain in left toes  Consent was obtained for treatment procedures.   Mechanical debridement of nails 1-5  bilaterally performed with a nail nipper.  Filed with dremel without incident.  Patient to make a appointment with Trish for diabetic shoes.   Return office visit   3 months                   Told patient to return for periodic foot care and evaluation due to potential at risk complications.   Helane Gunther DPM

## 2024-04-28 NOTE — Progress Notes (Incomplete)
 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   IV iron  infusion when needed. Last treatment given in November 2023.  HISTORY OF PRESENTING ILLNESS:   [name] is a wonderful 78 y.o. male who has been a previous patient of Dr. Amadeo. 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 Patent was last seen by Dr. Amadeo on 05/02/2022  and was doing well overall with no new medical concerns.   Today, he denies intestinal issues, acid reflux, bowel surgeries in past, or other abdominal issues. He reports that he has been eating and sleeping well. He reports that his last endoscopy/colonoscopy was over 5 years ago.  Patient has received iron  infusions in the past and has tolerated it well. He has not had any issues with his monthly B12 injections.   He reports that he previously had a stroke 28-29 years ago and is unable to hold objects with his right hand.He does however have good ROM in his left hand. He continues to drive independently.  MEDICAL HISTORY:  Past Medical History:  Diagnosis Date   Anemia    Multifactorial  - hx of B12 def and iron  deficiency, followed by heme, followed at [facility]   Automatic implantable cardioverter-defibrillator in situ    CAD (coronary artery disease)    a. STEMI s/p CABG 07/2007 (had preop cardiogenic shock, IABP, VDRF before surgery)   Cardiomyopathy, ischemic    a. Chronic systolic CHF s/p St. Jude dual chamber ICD 03/2008.    Carotid stenosis    a. Prior carotid dz/ surgery. b. Carotid dopp 07/2011 - 0-39% bilaterally.;   c. Doppler 11/13: 40-59% RICA, 0-39% LICA   Chronic renal insufficiency    Diabetes mellitus    GI bleed    Reported history   Hx of CABG    Hypertension    [facility]    Mitral regurgitation    Myocardial infarction [facility])    Neuromuscular disorder (HCC)    R sided weakness    Osteoarthritis    PAF (paroxysmal atrial fibrillation) (HCC)    a. Poor Coumadin candidate due to hx of GI bleed.   Sinus bradycardia    Sleep apnea    study done, 10 yrs ago, unable to tolerate CPAP   Splenomegaly    Stroke [facility])    a. CVA 1990s - chronic pain in RUE after stroke.   Systolic CHF, chronic (HCC)    Previously taken off lisinopril  by primary doctor due to labs   Tobacco abuse     SURGICAL HISTORY: Past Surgical History:  Procedure Laterality Date   CARDIAC DEFIBRILLATOR PLACEMENT  04/05/2008   Implantation of a St. Jude dual-chamber defibrillator, Danelle ORN. Waddell , MD   CARDIAC DEFIBRILLATOR PLACEMENT     CHOLECYSTECTOMY N/A 02/19/2016   Procedure: LAPAROSCOPIC CHOLECYSTECTOMY;  Surgeon: Vicenta Poli, MD;  Location: [facility] OR;  Service: General;  Laterality: N/A;   COLONOSCOPY  07/2007   Rourk: left-sided diverticulum. TI normal. No etiology for IDA.   CORONARY ARTERY BYPASS  GRAFT     CORONARY ARTERY BYPASS GRAFT  08/29/2007   x3; Sudie H. Dusty MD   EP IMPLANTABLE DEVICE N/A 10/05/2015   Procedure: ICD Generator Changeout;  Surgeon: Danelle LELON Birmingham, MD;  Location: [facility] INVASIVE CV LAB;  Service: Cardiovascular;  Laterality: N/A;   ERCP N/A 02/18/2016   Procedure: ENDOSCOPIC RETROGRADE CHOLANGIOPANCREATOGRAPHY (ERCP);  Surgeon: Oliva Boots, MD;  Location: [facility] ENDOSCOPY;  Service: Endoscopy;  Laterality: N/A;   ESOPHAGOGASTRODUODENOSCOPY  07/2007   Rourk: small hh, atrophic gastric mucosa, poor distensibility of stomach ?extrinsic compression, CT showed mild to moderate splenomegaly. No  etiology for IDA.   ICD GENERATOR CHANGEOUT N/A 02/16/2024   Procedure: ICD GENERATOR CHANGEOUT;  Surgeon: Birmingham Danelle LELON, MD;  Location: [facility] INVASIVE CV LAB;  Service: Cardiovascular;  Laterality: N/A;   JOINT REPLACEMENT  1990's   L knee   LAPAROSCOPIC APPENDECTOMY N/A 09/14/2017   Procedure: APPENDECTOMY LAPAROSCOPIC;  Surgeon: Kimble Agent, MD;  Location: [facility] OR;  Service: General;  Laterality: N/A;   Percutaneous coronary intervention  01/10/2005   Wolm Nida, MD   TONSILLECTOMY     TOTAL KNEE ARTHROPLASTY Right 08/16/2013   Procedure: RIGHT TOTAL KNEE ARTHROPLASTY;  Surgeon: Lonni CINDERELLA Poli, MD;  Location: [facility] OR;  Service: Orthopedics;  Laterality: Right;    SOCIAL HISTORY: Social History   Socioeconomic History   Marital status: Widowed    Spouse name: Not on file   Number of children: Not on file   Years of education: Not on file   Highest education level: Not on file  Occupational History   Occupation: Retired    Associate Professor: UNEMPLOYED  Tobacco Use   Smoking status: Former    Current packs/day: 0.00    Average packs/day: 1 pack/day for 15.0 years (15.0 ttl pk-yrs)    Types: Cigarettes    Start date: 09/30/1975    Quit date: 09/29/1990    Years since quitting: 33.6   Smokeless tobacco: Former    Quit date: 08/09/1988  Vaping Use   Vaping status: Never Used  Substance and Sexual Activity   Alcohol use: No   Drug use: No   Sexual activity: Not Currently  Other Topics Concern   Not on file  Social History Narrative   ** Merged History Encounter **       Lives in [place] with wife   Been on disability since his stroke in the 1990s   Not routinely exercising   Social Drivers of Health   Financial Resource Strain: Not on file  Food Insecurity: Not on file  Transportation Needs: Not on file  Physical Activity: Not on file  Stress: Not on file  Social Connections: Not on file  Intimate Partner Violence: Not on file    FAMILY HISTORY: Family History   Problem Relation Age of Onset   Heart attack Mother        CVA, MI   Diabetes Mother    Heart attack Father        MI   Diabetes Sister    Coronary artery disease Brother        CABG    ALLERGIES:  is allergic to codeine.  MEDICATIONS:  Current Outpatient Medications  Medication Sig Dispense Refill   albuterol (VENTOLIN HFA) 108 (90 Base) MCG/ACT inhaler Inhale 2 puffs into the lungs every 4 (four) hours as needed for wheezing or shortness of breath.     atorvastatin  (LIPITOR ) 40 MG tablet Take 40 mg by mouth daily.  Azelastine HCl 137 MCG/SPRAY SOLN Place 1 spray into both nostrils 2 (two) times daily as needed (allergies).     baclofen  (LIORESAL ) 20 MG tablet Take 40 mg by mouth at bedtime.      carvedilol  (COREG ) 25 MG tablet Take 1 tablet (25 mg total) by mouth 2 (two) times daily with a meal. (Patient taking differently: Take 12.5 mg by mouth 2 (two) times daily.) 180 tablet 3   DULoxetine  (CYMBALTA ) 30 MG capsule Take 30 mg by mouth daily.     furosemide  (LASIX ) 20 MG tablet Take 1 tablet (20 mg total) by mouth 2 (two) times daily. 30 tablet    glimepiride  (AMARYL ) 4 MG tablet Take 1 tablet (4 mg total) by mouth daily with breakfast. (Patient taking differently: Take 4 mg by mouth 2 (two) times daily.)     hydrochlorothiazide (HYDRODIURIL) 25 MG tablet Take 25 mg by mouth daily.     meclizine  (ANTIVERT ) 25 MG tablet Take 25 mg by mouth 4 (four) times daily as needed for dizziness.     ondansetron  (ZOFRAN ) 4 MG tablet Take 4 mg by mouth every 8 (eight) hours as needed for nausea or vomiting.     oxyCODONE -acetaminophen  (PERCOCET/ROXICET) 5-325 MG tablet Take 0.5 tablets by mouth every 6 (six) hours as needed for moderate pain (pain score 4-6).     rivaroxaban  (XARELTO ) 20 MG TABS tablet TAKE 1 TABLET BY MOUTH ONCE DAILY WITH SUPPER 90 tablet 1   SOLIQUA 100-33 UNT-MCG/ML SOPN Inject 38 Units into the skin daily.     tamsulosin (FLOMAX) 0.4 MG CAPS capsule Take 0.4 mg by mouth  daily.     No current facility-administered medications for this visit.    REVIEW OF SYSTEMS:    10 Point review of Systems was done is negative except as noted above.  PHYSICAL EXAMINATION: ECOG PERFORMANCE STATUS: 1 - Symptomatic but completely ambulatory  . There were no vitals filed for this visit.  There were no vitals filed for this visit.  .There is no height or weight on file to calculate BMI.  GENERAL:alert, in no acute distress and comfortable SKIN: no acute rashes, no significant lesions EYES: conjunctiva are pink and non-injected, sclera anicteric OROPHARYNX: MMM, no exudates, no oropharyngeal erythema or ulceration NECK: supple, no JVD LYMPH:  no palpable lymphadenopathy in the cervical, axillary or inguinal regions LUNGS: clear to auscultation b/l with normal respiratory effort HEART: regular rate & rhythm ABDOMEN:  normoactive bowel sounds , non tender, not distended. Extremity: no pedal edema PSYCH: alert & oriented x 3 with fluent speech NEURO: no focal motor/sensory deficits  LABORATORY DATA:  I have reviewed the data as listed .    Latest Ref Rng & Units 04/24/2024    5:21 PM 02/10/2024    1:15 PM 12/17/2023    9:15 AM  CBC  WBC 4.0 - 10.5 K/uL 6.4  6.4  6.4   Hemoglobin 13.0 - 17.0 g/dL 87.0  86.5  86.2   Hematocrit 39.0 - 52.0 % 40.8  43.2  43.3   Platelets 150 - 400 K/uL 219  245  257    .    Latest Ref Rng & Units 04/24/2024    5:57 PM 02/10/2024    1:15 PM 12/17/2023    9:14 AM  CMP  Glucose 70 - 99 mg/dL 866  886  879   BUN 8 - 23 mg/dL 28  15  21    Creatinine 0.61 - 1.24 mg/dL 8.64  8.95  8.86  Sodium 135 - 145 mmol/L 137  143  143   Potassium 3.5 - 5.1 mmol/L 3.0  4.6  4.6   Chloride 98 - 111 mmol/L 97  102  102   CO2 22 - 32 mmol/L 27  23  27    Calcium  8.9 - 10.3 mg/dL 8.9  9.0  9.5   Total Protein 6.5 - 8.1 g/dL 6.7     Total Bilirubin 0.0 - 1.2 mg/dL 1.0     Alkaline Phos 38 - 126 U/L 77     AST 15 - 41 U/L 19     ALT 0 - 44  U/L 12      . Lab Results  Component Value Date   IRON  52 04/24/2023   TIBC 360 04/24/2023   IRONPCTSAT 15 (L) 04/24/2023   (Iron  and TIBC)  Lab Results  Component Value Date   FERRITIN 31 04/24/2023   B 12 -- 621   RADIOGRAPHIC STUDIES: I have personally reviewed the radiological images as listed and agreed with the findings in the report. CUP PACEART INCLINIC DEVICE CHECK Result Date: 04/27/2024 Normal dual chamber ICD wound check. Wound well healed. Presenting rhythm: AP/VS 63 . Routine testing performed. Thresholds, sensing, and impedances consistent with implant measurements with auto capture until 3 month visit. No treated arrhythmias. Corvue showed recent 15 days of fluid retention; however, has currently resolved.  Patient denies any symptoms of generalized edema, no SOB with exertion, feels he is doing well.   No arm restrictions due to chronic leads, gen change only. Reviewed shock  plan.  Pt enrolled in remote follow-up.Lorn Fees, RN  CT ANGIO HEAD NECK W WO CM Result Date: 04/24/2024 CLINICAL DATA:  Vertigo, central.  Dizziness for 3 days.  Nausea. EXAM: CT ANGIOGRAPHY HEAD AND NECK WITH AND WITHOUT CONTRAST TECHNIQUE: Multidetector CT imaging of the head and neck was performed using the standard protocol during bolus administration of intravenous contrast. Multiplanar CT image reconstructions and MIPs were obtained to evaluate the vascular anatomy. Carotid stenosis measurements (when applicable) are obtained utilizing NASCET criteria, using the distal internal carotid diameter as the denominator. RADIATION DOSE REDUCTION: This exam was performed according to the departmental dose-optimization program which includes automated exposure control, adjustment of the mA and/or kV according to patient size and/or use of iterative reconstruction technique. CONTRAST:  75mL OMNIPAQUE  IOHEXOL  350 MG/ML SOLN COMPARISON:  CT head 10/17/2023.  CTA head and neck 06/01/2022. FINDINGS: CT HEAD  FINDINGS Brain: There is no evidence of an acute infarct, intracranial hemorrhage, mass, midline shift, or extra-axial fluid collection. Chronic infarcts are again noted in the medial left temporal lobe and left thalamus with associated ex vacuo dilatation of the left lateral ventricle. There is mild cerebral atrophy. Vascular: Calcified atherosclerosis at the skull base. No hyperdense vessel. Skull: No fracture or suspicious lesion. Sinuses/Orbits: Chronic right sphenoid sinusitis. Clear mastoid air cells. Unremarkable orbits. Other: None. Review of the MIP images confirms the above findings CTA NECK FINDINGS Aortic arch: Standard branching with mild atherosclerosis. No significant stenosis of the arch vessel origins. Right carotid system: Patent with soft and calcified plaque at the carotid bifurcation resulting in less than 50% stenosis of the ICA origin. Left carotid system: Patent with soft and calcified plaque at the carotid bifurcation not resulting in a significant stenosis. Vertebral arteries: Proximally 65% stenosis of the right subclavian artery origin, similar to the prior CTA. Patent and strongly dominant right vertebral artery with limited assessment of portions of its V1 segment due  to streak artifact but no evidence of a significant stenosis more distally. 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 Skeleton: Moderate disc degeneration at C5-6. Advanced multilevel cervical facet arthrosis, overall worse on the left. Other neck: No evidence of cervical lymphadenopathy or mass. Upper chest: Clear lung apices. Review of the MIP images confirms the above findings CTA HEAD FINDINGS Anterior circulation: The internal carotid arteries are patent from skull base to carotid termini with mild-to-moderate atherosclerosis not resulting in a significant stenosis. ACAs and MCAs are patent without evidence of a  proximal branch occlusion or significant proximal stenosis. No aneurysm is identified. Posterior circulation: The intracranial vertebral arteries are patent to the basilar with the right being strongly dominant. A severe stenosis of the left V4 segment near the PICA origin is stable to minimally progressed. The basilar artery is widely patent. Posterior communicating arteries are diminutive or absent. Both PCAs are patent without evidence of a significant proximal stenosis. No aneurysm is identified. Venous sinuses: Patent. Anatomic variants: None. Review of the MIP images confirms the above findings IMPRESSION: 1. No evidence of acute intracranial abnormality or acute large vessel occlusion. 2. 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 Severe left V4 stenosis. 3. Widely patent and strongly dominant right vertebral artery. 4. 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 5. Cervical carotid atherosclerosis without significant stenosis. 6.  Aortic Atherosclerosis (ICD10-I70.0). Electronically Signed   By: Dasie Hamburg M.D.   On: 04/24/2024 19:36    ASSESSMENT & PLAN:   Wonderful 78 y.o.  man with:     1.  B12 deficiency diagnosed in 2008.  He remains on related to poor absorption noted in 2008.    He continues to receive B12 injections without any complications.  B12 level continues to be adequate without any need for any additional supplementation.  His hemoglobin continues to improve as well.     2. Iron  deficiency anemia related to poor iron  absorption diagnosed in 2008.  Iron  studies on January 03, 2022 were all within normal range at this time.  We will continue to monitor and supplement as needed.  PLAN: -Discussed lab results from 12/11/22 with patient. CBC showed WBC of 6.4K, hemoglobin improved to 11.2, and platelets of 243K. -RBCS smaller, likely due to iron  deficiency -B12 - 621 -Iron  saturation 4%, and ferritin 16 suggesting  iron  deficiency -discussed option for patient to be seen at [facility].Patient would like to continue receiving care at [facility] -Will continue B12 injections at [facility] at this time, patient may take this independently at home at some point -Will switch B12 injections to [place] instead of IM -Patient's blood sugar is elevated -set up IV Venofer  200mg  weekly x 4 doses -patient shall return to clinic in 4 months for continued monitoring  FOLLOW-UP: Plz schedule monthly [place] B12 inj x 12 IV Venofer  200mg  weekly x 4 doses RTC with Dr Onesimo with labs in 4 months  The total time spent in the appointment was 20 minutes* .  All of the patient's questions were answered with apparent satisfaction. The patient knows to call the clinic with any problems, questions or concerns.   Emaline Onesimo MD MS AAHIVMS [facility] Hematology/Oncology Physician [facility]  .*Total Encounter Time as defined by the Centers for Medicare and Medicaid Services includes, in addition to the face-to-face time of a  patient visit (documented in the note above) non-face-to-face time: obtaining and reviewing outside history, ordering and reviewing medications, tests or procedures, care coordination (communications with other health care professionals or caregivers) and documentation in the medical record.    I,Mitra Faeizi,acting as a Neurosurgeon for Emaline Saran, MD.,have documented all relevant documentation on the behalf of Emaline Saran, MD,as directed by  Emaline Saran, MD while in the presence of Emaline Saran, MD.   .I have reviewed the above documentation for accuracy and completeness, and I agree with the above. .Gautam Kishore Kale MD

## 2024-04-29 ENCOUNTER — Inpatient Hospital Stay: Admitting: Hematology

## 2024-04-29 ENCOUNTER — Inpatient Hospital Stay

## 2024-05-02 ENCOUNTER — Telehealth: Payer: Self-pay | Admitting: Hematology

## 2024-05-02 ENCOUNTER — Emergency Department (HOSPITAL_COMMUNITY)
Admission: EM | Admit: 2024-05-02 | Discharge: 2024-05-02 | Disposition: A | Discharge status: Home / Self Care | Attending: Emergency Medicine | Admitting: Emergency Medicine

## 2024-05-02 ENCOUNTER — Other Ambulatory Visit: Payer: Self-pay

## 2024-05-02 ENCOUNTER — Encounter (HOSPITAL_COMMUNITY): Payer: Self-pay | Admitting: *Deleted

## 2024-05-02 DIAGNOSIS — R739 Hyperglycemia, unspecified: Secondary | ICD-10-CM | POA: Diagnosis present

## 2024-05-02 DIAGNOSIS — E876 Hypokalemia: Secondary | ICD-10-CM | POA: Diagnosis not present

## 2024-05-02 DIAGNOSIS — R631 Polydipsia: Secondary | ICD-10-CM | POA: Insufficient documentation

## 2024-05-02 DIAGNOSIS — R3589 Other polyuria: Secondary | ICD-10-CM | POA: Diagnosis not present

## 2024-05-02 DIAGNOSIS — Z7901 Long term (current) use of anticoagulants: Secondary | ICD-10-CM | POA: Diagnosis not present

## 2024-05-02 DIAGNOSIS — E871 Hypo-osmolality and hyponatremia: Secondary | ICD-10-CM | POA: Insufficient documentation

## 2024-05-02 LAB — URINALYSIS, W/ REFLEX TO CULTURE (INFECTION SUSPECTED)
Bacteria, UA: NONE SEEN
Bilirubin Urine: NEGATIVE
Glucose, UA: >=500 mg/dL — AB
Hgb urine dipstick: NEGATIVE
Ketones, ur: NEGATIVE mg/dL
Leukocytes,Ua: NEGATIVE
Nitrite: NEGATIVE
Protein, ur: NEGATIVE mg/dL
Specific Gravity, Urine: 1.011 (ref 1.005–1.030)
pH: 6 (ref 5.0–8.0)

## 2024-05-02 LAB — CBC WITH DIFFERENTIAL/PLATELET
Abs Immature Granulocytes: 0.02 K/uL (ref 0.00–0.07)
Basophils Absolute: 0.1 K/uL (ref 0.0–0.1)
Basophils Relative: 1 %
Eosinophils Absolute: 0.2 K/uL (ref 0.0–0.5)
Eosinophils Relative: 3 %
HCT: 43 % (ref 39.0–52.0)
Hemoglobin: 14.2 g/dL (ref 13.0–17.0)
Immature Granulocytes: 0 %
Lymphocytes Relative: 18 %
Lymphs Abs: 1.2 K/uL (ref 0.7–4.0)
MCH: 27.9 pg (ref 26.0–34.0)
MCHC: 33 g/dL (ref 30.0–36.0)
MCV: 84.5 fL (ref 80.0–100.0)
Monocytes Absolute: 0.7 K/uL (ref 0.1–1.0)
Monocytes Relative: 11 %
Neutro Abs: 4.5 K/uL (ref 1.7–7.7)
Neutrophils Relative %: 67 %
Platelets: 214 K/uL (ref 150–400)
RBC: 5.09 MIL/uL (ref 4.22–5.81)
RDW: 15.5 % (ref 11.5–15.5)
WBC: 6.8 K/uL (ref 4.0–10.5)
nRBC: 0 % (ref 0.0–0.2)

## 2024-05-02 LAB — HEPATIC FUNCTION PANEL
ALT: 13 U/L (ref 0–44)
AST: 18 U/L (ref 15–41)
Albumin: 4.3 g/dL (ref 3.5–5.0)
Alkaline Phosphatase: 88 U/L (ref 38–126)
Bilirubin, Direct: 0.2 mg/dL (ref 0.0–0.2)
Indirect Bilirubin: 0.5 mg/dL (ref 0.3–0.9)
Total Bilirubin: 0.7 mg/dL (ref 0.0–1.2)
Total Protein: 7.3 g/dL (ref 6.5–8.1)

## 2024-05-02 LAB — URINALYSIS, ROUTINE W REFLEX MICROSCOPIC
Bacteria, UA: NONE SEEN
Bilirubin Urine: NEGATIVE
Glucose, UA: >=500 mg/dL — AB
Hgb urine dipstick: NEGATIVE
Ketones, ur: NEGATIVE mg/dL
Leukocytes,Ua: NEGATIVE
Nitrite: NEGATIVE
Protein, ur: NEGATIVE mg/dL
Specific Gravity, Urine: 1.01 (ref 1.005–1.030)
pH: 6 (ref 5.0–8.0)

## 2024-05-02 LAB — BASIC METABOLIC PANEL WITH GFR
Anion gap: 13 (ref 5–15)
BUN: 29 mg/dL — ABNORMAL HIGH (ref 8–23)
CO2: 32 mmol/L (ref 22–32)
Calcium: 9.2 mg/dL (ref 8.9–10.3)
Chloride: 87 mmol/L — ABNORMAL LOW (ref 98–111)
Creatinine, Ser: 1.36 mg/dL — ABNORMAL HIGH (ref 0.61–1.24)
GFR, Estimated: 54 mL/min — ABNORMAL LOW (ref >60)
Glucose, Bld: 451 mg/dL — ABNORMAL HIGH (ref 70–99)
Potassium: 3.1 mmol/L — ABNORMAL LOW (ref 3.5–5.1)
Sodium: 132 mmol/L — ABNORMAL LOW (ref 135–145)

## 2024-05-02 LAB — BLOOD GAS, VENOUS
Acid-Base Excess: 13.3 mmol/L — ABNORMAL HIGH (ref 0.0–2.0)
Bicarbonate: 40.5 mmol/L — ABNORMAL HIGH (ref 20.0–28.0)
Drawn by: 66297
O2 Saturation: 44.6 %
Patient temperature: 37.1
pCO2, Ven: 61 mmHg — ABNORMAL HIGH (ref 44–60)
pH, Ven: 7.43 (ref 7.25–7.43)
pO2, Ven: <31 mmHg — CL (ref 32–45)

## 2024-05-02 LAB — CBG MONITORING, ED
Glucose-Capillary: 529 mg/dL (ref 70–99)
Glucose-Capillary: 412 mg/dL — ABNORMAL HIGH (ref 70–99)

## 2024-05-02 LAB — BETA-HYDROXYBUTYRIC ACID: Beta-Hydroxybutyric Acid: 0.13 mmol/L (ref 0.05–0.27)

## 2024-05-02 MED ORDER — INSULIN ASPART 100 UNIT/ML IJ SOLN
10.0000 [IU] | Freq: Once | INTRAMUSCULAR | Status: AC
  Administered 2024-05-02: 10 [IU] via SUBCUTANEOUS
  Filled 2024-05-02: qty 1

## 2024-05-02 MED ORDER — SODIUM CHLORIDE 0.9 % IV BOLUS: 500.0000 mL | Freq: Once | INTRAVENOUS | Status: DC

## 2024-05-02 NOTE — ED Triage Notes (Signed)
 Pt here for elevated blood sugars, last 504 and 505. + dizziness, + dry mouth.

## 2024-05-02 NOTE — ED Notes (Signed)
 pt says he is thirsty and would rather drink his fluids than be stuck with IV. Pt given 3 eight oz cups of water which equals 709 ml - Dr Garrick made aware

## 2024-05-02 NOTE — ED Provider Notes (Signed)
 [place] EMERGENCY DEPARTMENT AT [facility] Provider Note   CSN: [id number] Arrival date & time: 05/02/24  1753     Patient presents with: Hyperglycemia   [name] is a 78 y.o. male.   HPI Assists with the history.  He presents with concern for hyperglycemia. Patient recently started on Ozempic, next dose scheduled for tomorrow.  Anticipation of this patient stopped insulin  2 weeks ago, metformin.  He complains of polyuria, polydipsia, denies pain, nausea, vomiting, fever.  Acknowledges multiple other medical problems.    Prior to Admission medications   Medication Sig Start Date End Date Taking? Authorizing Provider  albuterol (VENTOLIN HFA) 108 (90 Base) MCG/ACT inhaler Inhale 2 puffs into the lungs every 4 (four) hours as needed for wheezing or shortness of breath.    [provider]  atorvastatin  (LIPITOR ) 40 MG tablet Take 40 mg by mouth daily. 07/16/20   [provider]  Azelastine HCl 137 MCG/SPRAY SOLN Place 1 spray into both nostrils 2 (two) times daily as needed (allergies). 03/16/24   [provider]  baclofen  (LIORESAL ) 20 MG tablet Take 40 mg by mouth at bedtime.     [provider]  carvedilol  (COREG ) 25 MG tablet Take 1 tablet (25 mg total) by mouth 2 (two) times daily with a meal. Patient taking differently: Take 12.5 mg by mouth 2 (two) times daily. 09/12/20   Waddell Danelle ORN, MD  cetirizine (ZYRTEC) 10 MG tablet Take 10 mg by mouth daily. 04/25/24   [provider]  DULoxetine  (CYMBALTA ) 30 MG capsule Take 30 mg by mouth daily.    [provider]  furosemide  (LASIX ) 20 MG tablet Take 1 tablet (20 mg total) by mouth 2 (two) times daily. 06/06/22   Johnson, Clanford L, MD  glimepiride  (AMARYL ) 4 MG tablet Take 1 tablet (4 mg total) by mouth daily with breakfast. Patient taking differently: Take 4 mg by mouth 2 (two) times daily. 06/05/22   Johnson, Clanford L, MD  hydrochlorothiazide (HYDRODIURIL) 25  MG tablet Take 25 mg by mouth daily.    [provider]  meclizine  (ANTIVERT ) 25 MG tablet Take 25 mg by mouth 4 (four) times daily as needed for dizziness. 02/26/12   [provider]  metFORMIN (GLUCOPHAGE) 500 MG tablet Take 500 mg by mouth daily. 04/28/24   [provider]  ondansetron  (ZOFRAN ) 4 MG tablet Take 4 mg by mouth every 8 (eight) hours as needed for nausea or vomiting. 12/15/23   [provider]  oxyCODONE -acetaminophen  (PERCOCET/ROXICET) 5-325 MG tablet Take 0.5 tablets by mouth every 6 (six) hours as needed for moderate pain (pain score 4-6). 04/18/19   [provider]  rivaroxaban  (XARELTO ) 20 MG TABS tablet TAKE 1 TABLET BY MOUTH ONCE DAILY WITH SUPPER 12/30/23   Waddell Danelle ORN, MD  Semaglutide,0.25 or 0.5MG /DOS, (OZEMPIC, 0.25 OR 0.5 MG/DOSE,) 2 MG/3ML SOPN Inject 0.25-0.5 mg into the skin once a week. Inject 0.25 mg into the skin weekly for 4 weeks, then increase to 0.5 mg weekly thereafter.    [provider]  SOLIQUA 100-33 UNT-MCG/ML SOPN Inject 38 Units into the skin daily. 07/17/20   [provider]  tamsulosin (FLOMAX) 0.4 MG CAPS capsule Take 0.4 mg by mouth daily.    [provider]    Allergies: Codeine    Review of Systems  Updated Vital Signs BP (!) 159/71   Pulse 63   Temp 98.8 F (37.1 C) (Oral)   Resp 17  Ht 6' 2 (1.88 m)   Wt 95.3 kg   SpO2 97%   BMI 26.96 kg/m   Physical Exam Vitals and nursing note reviewed.  Constitutional:      General: He is not in acute distress.    Appearance: He is well-developed.  HENT:     Head: Normocephalic and atraumatic.  Eyes:     Conjunctiva/sclera: Conjunctivae normal.  Cardiovascular:     Rate and Rhythm: Normal rate and regular rhythm.  Pulmonary:     Effort: Pulmonary effort is normal. No respiratory distress.     Breath sounds: No stridor.  Abdominal:     General: There is no distension.  Skin:    General: Skin is warm and dry.   Neurological:     Mental Status: He is alert and oriented to person, place, and time.     Comments: Poor hearing, age-appropriate atrophy otherwise reassuring physical neuroexam     (all labs ordered are listed, but only abnormal results are displayed) Labs Reviewed  BASIC METABOLIC PANEL WITH GFR - Abnormal; Notable for the following components:      Result Value   Sodium 132 (*)    Potassium 3.1 (*)    Chloride 87 (*)    Glucose, Bld 451 (*)    BUN 29 (*)    Creatinine, Ser 1.36 (*)    GFR, Estimated 54 (*)    All other components within normal limits  URINALYSIS, ROUTINE W REFLEX MICROSCOPIC - Abnormal; Notable for the following components:   Color, Urine STRAW (*)    Glucose, UA >=500 (*)    All other components within normal limits  BLOOD GAS, VENOUS - Abnormal; Notable for the following components:   pCO2, Ven 61 (*)    pO2, Ven <31 (*)    Bicarbonate 40.5 (*)    Acid-Base Excess 13.3 (*)    All other components within normal limits  URINALYSIS, W/ REFLEX TO CULTURE (INFECTION SUSPECTED) - Abnormal; Notable for the following components:   Color, Urine STRAW (*)    Glucose, UA >=500 (*)    All other components within normal limits  CBG MONITORING, ED - Abnormal; Notable for the following components:   Glucose-Capillary 529 (*)    All other components within normal limits  CBG MONITORING, ED - Abnormal; Notable for the following components:   Glucose-Capillary 412 (*)    All other components within normal limits  CBC WITH DIFFERENTIAL/PLATELET  BETA-HYDROXYBUTYRIC ACID  HEPATIC FUNCTION PANEL    EKG: None  Radiology: No results found.   Procedures   Medications Ordered in the ED  sodium chloride  0.9 % bolus 500 mL (500 mLs Intravenous Not Given 05/02/24 2026)  insulin  aspart (novoLOG ) injection 10 Units (10 Units Subcutaneous Given 05/02/24 2104)                                    Medical Decision Making Patient presents with concern of polyuria,  polydipsia, and point-of-care glucose greater than 400 on initial evaluation here.  Concern for hyperglycemia versus DKA versus nonketotic hyperosmolar state versus dehydration or infection. Monitoring 60 sinus normal pulse ox 100% room air normal  Amount and/or Complexity of Data Reviewed Independent Historian:     Details: Daughter at bedside External Data Reviewed: notes. Labs: ordered. Decision-making details documented in ED Course.  Risk Prescription drug management. Decision regarding hospitalization. Diagnosis or treatment significantly limited by social determinants  of health.   Labs with no acidosis, mild elevation in creatinine no anion gap.  Patient received insulin  subcu, requested oral fluid resuscitation rather than IV, given the absence of distress, hemodynamic instability, this was accommodated.  9:59 PM Patient remained in no distress, awake alert, sitting upright.  Labs reviewed, discussed with family, beta-hydroxybutyrate level normal, no anion gap acidosis, patient has received subcu insulin  and will be discharged to follow-up with his physician tomorrow for further consideration of appropriate medication while initiating Ozempic therapy.     Final diagnoses:  Hyperglycemia    ED Discharge Orders     None          Garrick Charleston, MD 05/02/24 2159

## 2024-05-02 NOTE — ED Notes (Signed)
 Pt CBG 412.

## 2024-05-02 NOTE — Discharge Instructions (Signed)
 As discussed, your evaluation today has been largely reassuring.  But, it is important that you monitor your condition carefully, and do not hesitate to return to the ED if you develop new, or concerning changes in your condition. ? ?Otherwise, please follow-up with your physician for appropriate ongoing care. ? ?

## 2024-05-04 ENCOUNTER — Inpatient Hospital Stay: Attending: Hematology

## 2024-05-04 ENCOUNTER — Inpatient Hospital Stay: Admitting: Hematology

## 2024-05-04 VITALS — BP 125/61 | HR 63 | Temp 97.2°F | Resp 18 | Wt 194.2 lb

## 2024-05-04 DIAGNOSIS — D518 Other vitamin B12 deficiency anemias: Secondary | ICD-10-CM | POA: Diagnosis present

## 2024-05-04 DIAGNOSIS — E538 Deficiency of other specified B group vitamins: Secondary | ICD-10-CM | POA: Diagnosis not present

## 2024-05-04 DIAGNOSIS — D509 Iron deficiency anemia, unspecified: Secondary | ICD-10-CM

## 2024-05-04 LAB — CMP (CANCER CENTER ONLY)
ALT: 10 U/L (ref 0–44)
AST: 14 U/L — ABNORMAL LOW (ref 15–41)
Albumin: 4.4 g/dL (ref 3.5–5.0)
Alkaline Phosphatase: 98 U/L (ref 38–126)
Anion gap: 9 (ref 5–15)
BUN: 26 mg/dL — ABNORMAL HIGH (ref 8–23)
CO2: 34 mmol/L — ABNORMAL HIGH (ref 22–32)
Calcium: 9.5 mg/dL (ref 8.9–10.3)
Chloride: 91 mmol/L — ABNORMAL LOW (ref 98–111)
Creatinine: 1.31 mg/dL — ABNORMAL HIGH (ref 0.61–1.24)
GFR, Estimated: 56 mL/min — ABNORMAL LOW (ref >60)
Glucose, Bld: 333 mg/dL — ABNORMAL HIGH (ref 70–99)
Potassium: 3.4 mmol/L — ABNORMAL LOW (ref 3.5–5.1)
Sodium: 134 mmol/L — ABNORMAL LOW (ref 135–145)
Total Bilirubin: 0.7 mg/dL (ref 0.0–1.2)
Total Protein: 7 g/dL (ref 6.5–8.1)

## 2024-05-04 LAB — CBC WITH DIFFERENTIAL (CANCER CENTER ONLY)
Abs Immature Granulocytes: 0.01 K/uL (ref 0.00–0.07)
Basophils Absolute: 0.1 K/uL (ref 0.0–0.1)
Basophils Relative: 1 %
Eosinophils Absolute: 0.2 K/uL (ref 0.0–0.5)
Eosinophils Relative: 3 %
HCT: 42.2 % (ref 39.0–52.0)
Hemoglobin: 14.4 g/dL (ref 13.0–17.0)
Immature Granulocytes: 0 %
Lymphocytes Relative: 20 %
Lymphs Abs: 1.6 K/uL (ref 0.7–4.0)
MCH: 27.4 pg (ref 26.0–34.0)
MCHC: 34.1 g/dL (ref 30.0–36.0)
MCV: 80.4 fL (ref 80.0–100.0)
Monocytes Absolute: 0.8 K/uL (ref 0.1–1.0)
Monocytes Relative: 10 %
Neutro Abs: 5.2 K/uL (ref 1.7–7.7)
Neutrophils Relative %: 66 %
Platelet Count: 221 K/uL (ref 150–400)
RBC: 5.25 MIL/uL (ref 4.22–5.81)
RDW: 15.6 % — ABNORMAL HIGH (ref 11.5–15.5)
WBC Count: 7.9 K/uL (ref 4.0–10.5)
nRBC: 0 % (ref 0.0–0.2)

## 2024-05-04 LAB — IRON AND IRON BINDING CAPACITY (CC-WL,HP ONLY)
Iron: 64 ug/dL (ref 45–182)
Saturation Ratios: 17 % — ABNORMAL LOW (ref 17.9–39.5)
TIBC: 379 ug/dL (ref 250–450)
UIBC: 315 ug/dL (ref 117–376)

## 2024-05-04 LAB — VITAMIN B12: Vitamin B-12: 389 pg/mL (ref 180–914)

## 2024-05-04 LAB — FERRITIN: Ferritin: 64 ng/mL (ref 24–336)

## 2024-05-05 ENCOUNTER — Encounter: Payer: Self-pay | Admitting: Neurology

## 2024-05-05 ENCOUNTER — Ambulatory Visit (INDEPENDENT_AMBULATORY_CARE_PROVIDER_SITE_OTHER): Admitting: Neurology

## 2024-05-05 VITALS — Ht 74.0 in | Wt 194.0 lb

## 2024-05-05 DIAGNOSIS — R269 Unspecified abnormalities of gait and mobility: Secondary | ICD-10-CM | POA: Diagnosis not present

## 2024-05-05 DIAGNOSIS — R293 Abnormal posture: Secondary | ICD-10-CM

## 2024-05-05 DIAGNOSIS — R296 Repeated falls: Secondary | ICD-10-CM

## 2024-05-05 DIAGNOSIS — R42 Dizziness and giddiness: Secondary | ICD-10-CM | POA: Diagnosis not present

## 2024-05-05 DIAGNOSIS — R2689 Other abnormalities of gait and mobility: Secondary | ICD-10-CM | POA: Diagnosis not present

## 2024-05-05 DIAGNOSIS — Z8673 Personal history of transient ischemic attack (TIA), and cerebral infarction without residual deficits: Secondary | ICD-10-CM | POA: Diagnosis not present
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
## 2024-05-05 NOTE — Patient Instructions (Addendum)
 It was nice to meet you all.  Your recent scan did not show any acute findings.  Unfortunately, dizziness is a very common complaint but is often not due to a primary neurological reason or single underlying medical problem. Often, there a combination of factors, that result in dizziness. This includes blood pressure fluctuations, medication side effects, blood sugar fluctuations, stress, vertigo, poor sleep with sleep deprivation, dehydration, and electrolyte disturbance or other metabolic and endocrinological reasons, meaning hormone related problems such as thyroid  dysfunction.  Here is what I recommend for you:   Use your walker at all times.  See your primary to discuss your medication regimen and review your meds critically, as several medications on your list can cause dizziness, including: baclofen , oxycodone , cymbalta , and flomax.  Stay well hydrated, I recommend about 4 bottles of water per day, 16.9 oz size.  Limit caffeine to 1 serving per day. Avoid all alcohol (as you are).  Talk to your primary care about seeing an ENT (ear, nose and throat) specialist. Also, I recommend you get re-evaluated for hearing loss. You may benefit from hearing aids. I don't think you are safe to drive.  Please review your living situation. I am not sure you are safe to live by yourself.

## 2024-05-11 ENCOUNTER — Encounter: Payer: Self-pay | Admitting: Hematology

## 2024-05-11 NOTE — Progress Notes (Signed)
 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   IV iron  infusion when needed. Last treatment given in November 2023.  HISTORY OF PRESENTING ILLNESS:   [name] is a wonderful 78 y.o. male who has been a previous patient of Dr. Amadeo. 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  Patient was last seen by us  in March 2024 and is here for a follow-up. He had his ICD generator change out in May of this year. Notes some fatigue but no other acute new symptoms. Labs were done today.  MEDICAL HISTORY:  Past Medical History:  Diagnosis Date   Anemia    Multifactorial  - hx of B12 def and iron  deficiency, followed by heme, followed [facility]   Automatic implantable cardioverter-defibrillator in situ    CAD (coronary artery disease)    a. STEMI s/p CABG 07/2007 (had preop cardiogenic shock, IABP, VDRF before surgery)   Cardiomyopathy, ischemic    a. Chronic systolic CHF s/p St. Jude dual chamber ICD 03/2008.   Carotid stenosis    a. Prior carotid dz/ surgery. b. Carotid dopp 07/2011 - 0-39% bilaterally.;   c. Doppler 11/13: 40-59% RICA, 0-39% LICA   Chronic renal insufficiency    Diabetes mellitus    GI bleed    Reported history   Hx of CABG    Hypertension    [place] [facility]    Mitral regurgitation    Myocardial infarction [facility])    Neuromuscular disorder (HCC)    R sided weakness    Osteoarthritis    PAF (paroxysmal atrial fibrillation) (HCC)     a. Poor Coumadin candidate due to hx of GI bleed.   Sinus bradycardia    Sleep apnea    study done, 10 yrs ago, unable to tolerate CPAP   Splenomegaly    Stroke [facility])    a. CVA 1990s - chronic pain in RUE after stroke.   Systolic CHF, chronic (HCC)    Previously taken off lisinopril  by primary doctor due to labs   Tobacco abuse     SURGICAL HISTORY: Past Surgical History:  Procedure Laterality Date   CARDIAC DEFIBRILLATOR PLACEMENT  04/05/2008   Implantation of a St. Jude dual-chamber defibrillator, Danelle ORN. Waddell , MD   CARDIAC DEFIBRILLATOR PLACEMENT     CHOLECYSTECTOMY N/A 02/19/2016   Procedure: LAPAROSCOPIC CHOLECYSTECTOMY;  Surgeon: Vicenta Poli, MD;  Location: [facility] OR;  Service: General;  Laterality: N/A;   COLONOSCOPY  07/2007   Rourk: left-sided diverticulum. TI normal. No etiology for IDA.   CORONARY ARTERY BYPASS GRAFT     CORONARY ARTERY BYPASS GRAFT  08/29/2007   x3; Sudie H. Dusty MD   EP IMPLANTABLE DEVICE N/A 10/05/2015   Procedure: ICD Generator Changeout;  Surgeon: Danelle ORN Waddell, MD;  Location: [facility] INVASIVE CV LAB;  Service: Cardiovascular;  Laterality: N/A;   ERCP N/A 02/18/2016   Procedure: ENDOSCOPIC RETROGRADE CHOLANGIOPANCREATOGRAPHY (ERCP);  Surgeon: Oliva Boots, MD;  Location: [facility] ENDOSCOPY;  Service: Endoscopy;  Laterality: N/A;   ESOPHAGOGASTRODUODENOSCOPY  07/2007   Rourk: small hh, atrophic gastric mucosa, poor distensibility of stomach ?extrinsic compression, CT showed mild to moderate splenomegaly. No etiology for IDA.   ICD GENERATOR CHANGEOUT N/A 02/16/2024   Procedure: ICD GENERATOR CHANGEOUT;  Surgeon: Waddell Danelle ORN, MD;  Location: [facility] INVASIVE CV LAB;  Service: Cardiovascular;  Laterality: N/A;   JOINT REPLACEMENT  1990's   L knee   LAPAROSCOPIC APPENDECTOMY N/A 09/14/2017   Procedure: APPENDECTOMY LAPAROSCOPIC;  Surgeon: Kimble Agent, MD;  Location: [facility] OR;  Service: General;  Laterality: N/A;   Percutaneous coronary intervention  01/10/2005    Wolm Nida, MD   TONSILLECTOMY     TOTAL KNEE ARTHROPLASTY Right 08/16/2013   Procedure: RIGHT TOTAL KNEE ARTHROPLASTY;  Surgeon: Lonni CINDERELLA Poli, MD;  Location: [facility] OR;  Service: Orthopedics;  Laterality: Right;    SOCIAL HISTORY: Social History   Socioeconomic History   Marital status: Widowed    Spouse name: Not on file   Number of children: Not on file   Years of education: Not on file   Highest education level: Not on file  Occupational History   Occupation: Retired    Associate Professor: UNEMPLOYED  Tobacco Use   Smoking status: Former    Current packs/day: 0.00    Average packs/day: 1 pack/day for 15.0 years (15.0 ttl pk-yrs)    Types: Cigarettes    Start date: 09/30/1975    Quit date: 09/29/1990    Years since quitting: 33.6   Smokeless tobacco: Former    Quit date: 08/09/1988  Vaping Use   Vaping status: Never Used  Substance and Sexual Activity   Alcohol use: No   Drug use: No   Sexual activity: Not Currently  Other Topics Concern   Not on file  Social History Narrative   ** Merged History Encounter ** [place] with wifeBeen on disability since his stroke in the 1990sNot routinely exercising      Lives alone, daughters close and stay there sometimes   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  MEDICATIONS:  Current Outpatient Medications  Medication Sig Dispense Refill    albuterol (VENTOLIN HFA) 108 (90 Base) MCG/ACT inhaler Inhale 2 puffs into the lungs every 4 (four) hours as needed for wheezing or shortness of breath.     atorvastatin  (LIPITOR ) 40 MG tablet Take 40 mg by mouth daily.     Azelastine HCl 137 MCG/SPRAY SOLN Place 1 spray into both nostrils 2 (two) times daily as needed (allergies). (Patient not taking: Reported on 05/05/2024)     baclofen  (LIORESAL ) 20 MG tablet Take 40 mg by mouth at bedtime.      carvedilol  (COREG ) 25 MG tablet Take 1 tablet (25 mg total)  by mouth 2 (two) times daily with a meal. (Patient taking differently: Take 12.5 mg by mouth 2 (two) times daily.) 180 tablet 3   cetirizine (ZYRTEC) 10 MG tablet Take 10 mg by mouth daily.     DULoxetine  (CYMBALTA ) 30 MG capsule Take 30 mg by mouth daily.     furosemide  (LASIX ) 20 MG tablet Take 1 tablet (20 mg total) by mouth 2 (two) times daily. 30 tablet    glimepiride  (AMARYL ) 4 MG tablet Take 1 tablet (4 mg total) by mouth daily with breakfast. (Patient not taking: Reported on 05/05/2024)     hydrochlorothiazide (HYDRODIURIL) 25 MG tablet Take 25 mg by mouth daily.     LANTUS SOLOSTAR 100 UNIT/ML Solostar Pen Inject 30 Units into the skin daily.     meclizine  (ANTIVERT ) 25 MG tablet Take 25 mg by mouth 4 (four) times daily as needed for dizziness.     metFORMIN (GLUCOPHAGE) 500 MG tablet Take 500 mg by mouth daily.     ondansetron  (ZOFRAN ) 4 MG tablet Take 4 mg by mouth every 8 (eight) hours as needed for nausea or vomiting.     oxyCODONE -acetaminophen  (PERCOCET/ROXICET) 5-325 MG tablet Take 0.5 tablets by mouth every 6 (six) hours as needed for moderate pain (pain score 4-6).     rivaroxaban  (XARELTO ) 20 MG TABS tablet TAKE 1 TABLET BY MOUTH ONCE DAILY WITH SUPPER 90 tablet 1   Semaglutide,0.25 or 0.5MG /DOS, (OZEMPIC, 0.25 OR 0.5 MG/DOSE,) 2 MG/3ML SOPN Inject 0.25-0.5 mg into the skin once a week. Inject 0.25 mg into the skin weekly for 4 weeks, then increase to 0.5 mg weekly thereafter.      tamsulosin (FLOMAX) 0.4 MG CAPS capsule Take 0.4 mg by mouth daily. (Patient not taking: Reported on 05/05/2024)     No current facility-administered medications for this visit.    REVIEW OF SYSTEMS:   No abnormal bleeding or bruising noted. 10 Point review of Systems was done is negative except as noted above.  PHYSICAL EXAMINATION: ECOG PERFORMANCE STATUS: 1 - Symptomatic but completely ambulatory  . Vitals:   05/04/24 0958  BP: 125/61  Pulse: 63  Resp: 18  Temp: (!) 97.2 F (36.2 C)  SpO2: 95%   Filed Weights   05/04/24 0958  Weight: 194 lb 3.2 oz (88.1 kg)   .Body mass index is 24.93 kg/m. NAD GENERAL:alert, in no acute distress and comfortable SKIN: no acute rashes, no significant lesions EYES: conjunctiva are pink and non-injected, sclera anicteric OROPHARYNX: MMM, no exudates, no oropharyngeal erythema or ulceration NECK: supple, no JVD LYMPH:  no palpable lymphadenopathy in the cervical, axillary or inguinal regions LUNGS: clear to auscultation b/l with normal respiratory effort HEART: regular rate & rhythm ABDOMEN:  normoactive bowel sounds , non tender, not distended. Extremity: no pedal edema PSYCH: alert & oriented x 3 with fluent speech NEURO: no focal motor/sensory deficits LABORATORY DATA:  I have reviewed the data as listed .    Latest Ref Rng & Units 05/04/2024    9:20 AM 05/02/2024    7:21 PM 04/24/2024    5:21 PM  CBC  WBC 4.0 - 10.5 K/uL 7.9  6.8  6.4   Hemoglobin 13.0 - 17.0 g/dL 85.5  85.7  87.0   Hematocrit 39.0 - 52.0 % 42.2  43.0  40.8   Platelets 150 - 400 K/uL 221  214  219    .    Latest Ref Rng & Units 05/04/2024    9:20 AM 05/02/2024  8:04 PM 05/02/2024    7:21 PM  CMP  Glucose 70 - 99 mg/dL 666   548   BUN 8 - 23 mg/dL 26   29   Creatinine 9.38 - 1.24 mg/dL 8.68   8.63   Sodium 864 - 145 mmol/L 134   132   Potassium 3.5 - 5.1 mmol/L 3.4   3.1   Chloride 98 - 111 mmol/L 91   87   CO2 22 - 32 mmol/L 34   32   Calcium  8.9 - 10.3  mg/dL 9.5   9.2   Total Protein 6.5 - 8.1 g/dL 7.0  7.3    Total Bilirubin 0.0 - 1.2 mg/dL 0.7  0.7    Alkaline Phos 38 - 126 U/L 98  88    AST 15 - 41 U/L 14  18    ALT 0 - 44 U/L 10  13     . Lab Results  Component Value Date   IRON  64 05/04/2024   TIBC 379 05/04/2024   IRONPCTSAT 17 (L) 05/04/2024   (Iron  and TIBC)  Lab Results  Component Value Date   FERRITIN 64 05/04/2024   B 12 -- 389   RADIOGRAPHIC STUDIES: I have personally reviewed the radiological images as listed and agreed with the findings in the report. CUP PACEART INCLINIC DEVICE CHECK Result Date: 04/27/2024 Normal dual chamber ICD wound check. Wound well healed. Presenting rhythm: AP/VS 63 . Routine testing performed. Thresholds, sensing, and impedances consistent with implant measurements with auto capture until 3 month visit. No treated arrhythmias. Corvue showed recent 15 days of fluid retention; however, has currently resolved.  Patient denies any symptoms of generalized edema, no SOB with exertion, feels he is doing well.   No arm restrictions due to chronic leads, gen change only. Reviewed shock  plan.  Pt enrolled in remote follow-up.Lorn Fees, RN  CT ANGIO HEAD NECK W WO CM Result Date: 04/24/2024 CLINICAL DATA:  Vertigo, central.  Dizziness for 3 days.  Nausea. EXAM: CT ANGIOGRAPHY HEAD AND NECK WITH AND WITHOUT CONTRAST TECHNIQUE: Multidetector CT imaging of the head and neck was performed using the standard protocol during bolus administration of intravenous contrast. Multiplanar CT image reconstructions and MIPs were obtained to evaluate the vascular anatomy. Carotid stenosis measurements (when applicable) are obtained utilizing NASCET criteria, using the distal internal carotid diameter as the denominator. RADIATION DOSE REDUCTION: This exam was performed according to the departmental dose-optimization program which includes automated exposure control, adjustment of the mA and/or kV according to patient  size and/or use of iterative reconstruction technique. CONTRAST:  75mL OMNIPAQUE  IOHEXOL  350 MG/ML SOLN COMPARISON:  CT head 10/17/2023.  CTA head and neck 06/01/2022. FINDINGS: CT HEAD FINDINGS Brain: There is no evidence of an acute infarct, intracranial hemorrhage, mass, midline shift, or extra-axial fluid collection. Chronic infarcts are again noted in the medial left temporal lobe and left thalamus with associated ex vacuo dilatation of the left lateral ventricle. There is mild cerebral atrophy. Vascular: Calcified atherosclerosis at the skull base. No hyperdense vessel. Skull: No fracture or suspicious lesion. Sinuses/Orbits: Chronic right sphenoid sinusitis. Clear mastoid air cells. Unremarkable orbits. Other: None. Review of the MIP images confirms the above findings CTA NECK FINDINGS Aortic arch: Standard branching with mild atherosclerosis. No significant stenosis of the arch vessel origins. Right carotid system: Patent with soft and calcified plaque at the carotid bifurcation resulting in less than 50% stenosis of the ICA origin. Left carotid system: Patent with soft and calcified  plaque at the carotid bifurcation not resulting in a significant stenosis. Vertebral arteries: Proximally 65% stenosis of the right subclavian artery origin, similar to the prior CTA. Patent and strongly dominant right vertebral artery with limited assessment of portions of its V1 segment due to streak artifact but no evidence of a significant stenosis more distally. 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 Skeleton: Moderate disc degeneration at C5-6. Advanced multilevel cervical facet arthrosis, overall worse on the left. Other neck: No evidence of cervical lymphadenopathy or mass. Upper chest: Clear lung apices. Review of the MIP images confirms the above findings CTA HEAD FINDINGS Anterior circulation: The internal carotid  arteries are patent from skull base to carotid termini with mild-to-moderate atherosclerosis not resulting in a significant stenosis. ACAs and MCAs are patent without evidence of a proximal branch occlusion or significant proximal stenosis. No aneurysm is identified. Posterior circulation: The intracranial vertebral arteries are patent to the basilar with the right being strongly dominant. A severe stenosis of the left V4 segment near the PICA origin is stable to minimally progressed. The basilar artery is widely patent. Posterior communicating arteries are diminutive or absent. Both PCAs are patent without evidence of a significant proximal stenosis. No aneurysm is identified. Venous sinuses: Patent. Anatomic variants: None. Review of the MIP images confirms the above findings IMPRESSION: 1. No evidence of acute intracranial abnormality or acute large vessel occlusion. 2. 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 Severe left V4 stenosis. 3. Widely patent and strongly dominant right vertebral artery. 4. 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 5. Cervical carotid atherosclerosis without significant stenosis. 6.  Aortic Atherosclerosis (ICD10-I70.0). Electronically Signed   By: Dasie Hamburg M.D.   On: 04/24/2024 19:36    ASSESSMENT & PLAN:   Wonderful 78 y.o.  man with:     1.  B12 deficiency diagnosed in 2008.  He remains on related to poor absorption noted in 2008.    He continues to receive B12 injections without any complications.  B12 level continues to be adequate without any need for any additional supplementation.  His hemoglobin continues to improve as well.     2. Iron  deficiency anemia related to poor iron  absorption diagnosed in 2008.  Iron  studies on January 03, 2022 were all within normal range at this time.  We will continue to monitor and supplement as needed.  PLAN: - Patient's labs from 05/04/2024 were discussed in  details -CBC shows normal hemoglobin of 14.4 with normal WBC count and platelets CMP with stable chronic kidney disease creatinine of 1.3 noted to have uncontrolled diabetes with a blood sugar of 333 and some element of dehydration Ferritin of 64 with iron  saturation of 17% No indication for IV iron  at this time  FOLLOW-UP: Plz schedule monthly [place] B12 inj x 12 RTC with Dr Onesimo with labs in 6months  The total time spent in the appointment was 21 minutes*.  All of the patient's questions were answered with apparent satisfaction. The patient knows to call the clinic with any problems, questions or concerns.   Emaline Onesimo MD MS AAHIVMS [facility] Hematology/Oncology Physician [facility]  .*Total Encounter Time as defined by the Centers for Medicare and Medicaid Services includes, in addition to the face-to-face time of a patient visit (documented in the note above) non-face-to-face time: obtaining and reviewing outside history,  ordering and reviewing medications, tests or procedures, care coordination (communications with other health care professionals or caregivers) and documentation in the medical record.

## 2024-05-12 ENCOUNTER — Other Ambulatory Visit: Payer: Self-pay | Admitting: Internal Medicine

## 2024-05-12 DIAGNOSIS — I480 Paroxysmal atrial fibrillation: Secondary | ICD-10-CM

## 2024-05-13 NOTE — Telephone Encounter (Signed)
 Prescription refill request for Xarelto  received.  Indication:afib Last office visit:10/24 Weight:88  kg Age:78 Scr:1.31  8/25 CrCl:59.71  ml/min  Prescription refilled

## 2024-05-18 ENCOUNTER — Ambulatory Visit (INDEPENDENT_AMBULATORY_CARE_PROVIDER_SITE_OTHER)

## 2024-05-18 DIAGNOSIS — I428 Other cardiomyopathies: Secondary | ICD-10-CM | POA: Diagnosis not present

## 2024-05-19 ENCOUNTER — Encounter

## 2024-05-20 ENCOUNTER — Telehealth: Payer: Self-pay | Admitting: Hematology

## 2024-05-20 LAB — CUP PACEART REMOTE DEVICE CHECK
Battery Remaining Longevity: 95 mo
Battery Remaining Percentage: 92 %
Battery Voltage: 3.02 V
Brady Statistic AP VP Percent: 32 %
Brady Statistic AP VS Percent: 50 %
Brady Statistic AS VP Percent: 1 %
Brady Statistic AS VS Percent: 18 %
Brady Statistic RA Percent Paced: 82 %
Brady Statistic RV Percent Paced: 32 %
Date Time Interrogation Session: 20250822105614
HighPow Impedance: 45 Ohm
Implantable Lead Connection Status: 753985
Implantable Lead Connection Status: 753985
Implantable Lead Implant Date: 20090708
Implantable Lead Implant Date: 20090708
Implantable Lead Location: 753859
Implantable Lead Location: 753860
Implantable Lead Model: 7122
Implantable Pulse Generator Implant Date: 20250520
Lead Channel Impedance Value: 280 Ohm
Lead Channel Impedance Value: 330 Ohm
Lead Channel Pacing Threshold Amplitude: 0.5 V
Lead Channel Pacing Threshold Amplitude: 1.5 V
Lead Channel Pacing Threshold Pulse Width: 0.5 ms
Lead Channel Pacing Threshold Pulse Width: 0.5 ms
Lead Channel Sensing Intrinsic Amplitude: 1.4 mV
Lead Channel Sensing Intrinsic Amplitude: 7 mV
Lead Channel Setting Pacing Amplitude: 1.5 V
Lead Channel Setting Pacing Amplitude: 1.75 V
Lead Channel Setting Pacing Pulse Width: 0.5 ms
Lead Channel Setting Sensing Sensitivity: 0.5 mV
Pulse Gen Serial Number: 111071983

## 2024-05-22 ENCOUNTER — Ambulatory Visit: Payer: Self-pay | Admitting: Internal Medicine

## 2024-05-25 ENCOUNTER — Ambulatory Visit: Attending: Cardiovascular Disease | Admitting: Internal Medicine

## 2024-05-25 ENCOUNTER — Telehealth: Payer: Self-pay

## 2024-05-25 NOTE — Telephone Encounter (Signed)
 Outreach made to Pt.  Rescheduled Pt to see Dr. Waddell in [place] on September 10.  Will set reminder to call Pt prior to appt to remind him.

## 2024-06-01 ENCOUNTER — Inpatient Hospital Stay

## 2024-06-01 ENCOUNTER — Inpatient Hospital Stay: Attending: Hematology

## 2024-06-01 VITALS — BP 154/72 | HR 63 | Temp 98.7°F | Resp 18

## 2024-06-01 DIAGNOSIS — D509 Iron deficiency anemia, unspecified: Secondary | ICD-10-CM

## 2024-06-01 DIAGNOSIS — D518 Other vitamin B12 deficiency anemias: Secondary | ICD-10-CM | POA: Diagnosis present

## 2024-06-01 MED ORDER — CYANOCOBALAMIN 1000 MCG/ML IJ SOLN
1000.0000 ug | Freq: Once | INTRAMUSCULAR | Status: AC
  Administered 2024-06-01: 1000 ug via SUBCUTANEOUS
  Filled 2024-06-01: qty 1

## 2024-06-08 ENCOUNTER — Ambulatory Visit: Attending: Internal Medicine | Admitting: Internal Medicine

## 2024-06-08 ENCOUNTER — Encounter: Admitting: Internal Medicine

## 2024-06-08 VITALS — BP 152/70 | HR 64 | Ht 74.0 in | Wt 207.0 lb

## 2024-06-08 DIAGNOSIS — I5042 Chronic combined systolic (congestive) and diastolic (congestive) heart failure: Secondary | ICD-10-CM

## 2024-06-08 DIAGNOSIS — I472 Ventricular tachycardia, unspecified: Secondary | ICD-10-CM

## 2024-06-08 LAB — CUP PACEART INCLINIC DEVICE CHECK
Battery Remaining Longevity: 91 mo
Brady Statistic RA Percent Paced: 84 %
Brady Statistic RV Percent Paced: 26 %
Date Time Interrogation Session: 20250910114447
HighPow Impedance: 52.875
HighPow Impedance: 53 Ohm
Implantable Lead Connection Status: 753985
Implantable Lead Connection Status: 753985
Implantable Lead Implant Date: 20090708
Implantable Lead Implant Date: 20090708
Implantable Lead Location: 753859
Implantable Lead Location: 753860
Implantable Lead Model: 7122
Implantable Pulse Generator Implant Date: 20250520
Lead Channel Impedance Value: 325 Ohm
Lead Channel Impedance Value: 330 Ohm
Lead Channel Impedance Value: 337.5 Ohm
Lead Channel Impedance Value: 340 Ohm
Lead Channel Impedance Value: 456 Ohm
Lead Channel Impedance Value: 475 Ohm
Lead Channel Pacing Threshold Amplitude: 0.5 V
Lead Channel Pacing Threshold Amplitude: 0.5 V
Lead Channel Pacing Threshold Amplitude: 0.5 V
Lead Channel Pacing Threshold Amplitude: 0.75 V
Lead Channel Pacing Threshold Amplitude: 0.75 V
Lead Channel Pacing Threshold Amplitude: 1.25 V
Lead Channel Pacing Threshold Amplitude: 1.25 V
Lead Channel Pacing Threshold Amplitude: 1.5 V
Lead Channel Pacing Threshold Pulse Width: 0.4 ms
Lead Channel Pacing Threshold Pulse Width: 0.4 ms
Lead Channel Pacing Threshold Pulse Width: 0.5 ms
Lead Channel Pacing Threshold Pulse Width: 0.5 ms
Lead Channel Pacing Threshold Pulse Width: 0.5 ms
Lead Channel Pacing Threshold Pulse Width: 0.5 ms
Lead Channel Pacing Threshold Pulse Width: 0.8 ms
Lead Channel Pacing Threshold Pulse Width: 0.8 ms
Lead Channel Sensing Intrinsic Amplitude: 0.8 mV
Lead Channel Sensing Intrinsic Amplitude: 0.8 mV
Lead Channel Sensing Intrinsic Amplitude: 2.4 mV
Lead Channel Sensing Intrinsic Amplitude: 7.3 mV
Lead Channel Sensing Intrinsic Amplitude: 7.3 mV
Lead Channel Setting Pacing Amplitude: 2 V
Lead Channel Setting Pacing Amplitude: 2.5 V
Lead Channel Setting Pacing Pulse Width: 0.8 ms
Lead Channel Setting Sensing Sensitivity: 0.5 mV
Pulse Gen Serial Number: 111071983

## 2024-06-08 NOTE — Patient Instructions (Signed)
 Medication Instructions:  Your physician recommends that you continue on your current medications as directed. Please refer to the Current Medication list given to you today.  Take your Lasix  daily   *If you need a refill on your cardiac medications before your next appointment, please call your pharmacy*  Lab Work: NONE   If you have labs (blood work) drawn today and your tests are completely normal, you will receive your results only by: MyChart Message (if you have MyChart) OR A paper copy in the mail If you have any lab test that is abnormal or we need to change your treatment, we will call you to review the results.  Testing/Procedures: NONE   Follow-Up: At [facility], you and your health needs are our priority.  As part of our continuing mission to provide you with exceptional heart care, our providers are all part of one team.  This team includes your primary Cardiologist (physician) and Advanced Practice Providers or APPs (Physician Assistants and Nurse Practitioners) who all work together to provide you with the care you need, when you need it.  Your next appointment:   1 year(s)  Provider:   Dr. Almetta     We recommend signing up for the patient portal called MyChart.  Sign up information is provided on this After Visit Summary.  MyChart is used to connect with patients for Virtual Visits (Telemedicine).  Patients are able to view lab/test results, encounter notes, upcoming appointments, etc.  Non-urgent messages can be sent to your provider as well.   To learn more about what you can do with MyChart, go to [email or web address].   Other Instructions Thank you for choosing [facility]!

## 2024-06-08 NOTE — Progress Notes (Signed)
 HPI [name] returns today for followup. He is a pleasant 78 yo man with a h/o chronic systolic heart failure, poorly controlled atrial fib, persistent atrial fib, h/o GI bleeding on Xarelto , and s/p ICD. He underwent ICD gen change out. His Xarelto  was restarted. He denies chest pain. He has peripheral edema but has not been taking his lasix . He was noted to have some atrial undersensing on remote monitoring which we have programmed around. Allergies  Allergen Reactions   Codeine Nausea And Vomiting     Current Outpatient Medications  Medication Sig Dispense Refill   albuterol (VENTOLIN HFA) 108 (90 Base) MCG/ACT inhaler Inhale 2 puffs into the lungs every 4 (four) hours as needed for wheezing or shortness of breath.     atorvastatin  (LIPITOR ) 40 MG tablet Take 40 mg by mouth daily.     Azelastine HCl 137 MCG/SPRAY SOLN Place 1 spray into both nostrils 2 (two) times daily as needed (allergies).     baclofen  (LIORESAL ) 20 MG tablet Take 40 mg by mouth at bedtime.      carvedilol  (COREG ) 25 MG tablet Take 1 tablet (25 mg total) by mouth 2 (two) times daily with a meal. (Patient taking differently: Take 12.5 mg by mouth 2 (two) times daily.) 180 tablet 3   cetirizine (ZYRTEC) 10 MG tablet Take 10 mg by mouth daily.     DULoxetine  (CYMBALTA ) 30 MG capsule Take 30 mg by mouth daily.     furosemide  (LASIX ) 20 MG tablet Take 1 tablet (20 mg total) by mouth 2 (two) times daily. 30 tablet    glimepiride  (AMARYL ) 4 MG tablet Take 1 tablet (4 mg total) by mouth daily with breakfast.     hydrochlorothiazide (HYDRODIURIL) 25 MG tablet Take 25 mg by mouth daily.     LANTUS SOLOSTAR 100 UNIT/ML Solostar Pen Inject 30 Units into the skin daily.     meclizine  (ANTIVERT ) 25 MG tablet Take 25 mg by mouth 4 (four) times daily as needed for dizziness.     metFORMIN (GLUCOPHAGE) 500 MG tablet Take 500 mg by mouth daily.     ondansetron  (ZOFRAN ) 4 MG tablet Take 4 mg by mouth every 8 (eight) hours as  needed for nausea or vomiting.     oxyCODONE -acetaminophen  (PERCOCET/ROXICET) 5-325 MG tablet Take 0.5 tablets by mouth every 6 (six) hours as needed for moderate pain (pain score 4-6).     rivaroxaban  (XARELTO ) 20 MG TABS tablet TAKE 1 TABLET BY MOUTH ONCE DAILY WITH SUPPER 30 tablet 5   Semaglutide,0.25 or 0.5MG /DOS, (OZEMPIC, 0.25 OR 0.5 MG/DOSE,) 2 MG/3ML SOPN Inject 0.25-0.5 mg into the skin once a week. Inject 0.25 mg into the skin weekly for 4 weeks, then increase to 0.5 mg weekly thereafter.     tamsulosin (FLOMAX) 0.4 MG CAPS capsule Take 0.4 mg by mouth daily.     No current facility-administered medications for this visit.     Past Medical History:  Diagnosis Date   Anemia    Multifactorial  - hx of B12 def and iron  deficiency, followed by heme, followed [facility]   Automatic implantable cardioverter-defibrillator in situ    CAD (coronary artery disease)    a. STEMI s/p CABG 07/2007 (had preop cardiogenic shock, IABP, VDRF before surgery)   Cardiomyopathy, ischemic    a. Chronic systolic CHF s/p [employer] Jude dual chamber ICD 03/2008.   Carotid stenosis    a. Prior carotid dz/ surgery. b. Carotid dopp 07/2011 -  0-39% bilaterally.;   c. Doppler 11/13: 40-59% RICA, 0-39% LICA   Chronic renal insufficiency    Diabetes mellitus    GI bleed    Reported history   Hx of CABG    Hypertension    [place] [facility]    Mitral regurgitation    Myocardial infarction [facility])    Neuromuscular disorder (HCC)    R sided weakness    Osteoarthritis    PAF (paroxysmal atrial fibrillation) (HCC)    a. Poor Coumadin candidate due to hx of GI bleed.   Sinus bradycardia    Sleep apnea    study done, 10 yrs ago, unable to tolerate CPAP   Splenomegaly    Stroke [facility])    a. CVA 1990s - chronic pain in RUE after stroke.   Systolic CHF, chronic (HCC)    Previously taken off lisinopril  by primary doctor due to labs   Tobacco abuse     ROS:   All systems reviewed and negative except  as noted in the HPI.   Past Surgical History:  Procedure Laterality Date   CARDIAC DEFIBRILLATOR PLACEMENT  04/05/2008   Implantation of a St. Jude dual-chamber defibrillator, Danelle ORN. Waddell , MD   CARDIAC DEFIBRILLATOR PLACEMENT     CHOLECYSTECTOMY N/A 02/19/2016   Procedure: LAPAROSCOPIC CHOLECYSTECTOMY;  Surgeon: Vicenta Poli, MD;  Location: [facility] OR;  Service: General;  Laterality: N/A;   COLONOSCOPY  07/2007   Rourk: left-sided diverticulum. TI normal. No etiology for IDA.   CORONARY ARTERY BYPASS GRAFT     CORONARY ARTERY BYPASS GRAFT  08/29/2007   x3; Sudie H. Dusty MD   EP IMPLANTABLE DEVICE N/A 10/05/2015   Procedure: ICD Generator Changeout;  Surgeon: Danelle ORN Waddell, MD;  Location: [facility] INVASIVE CV LAB;  Service: Cardiovascular;  Laterality: N/A;   ERCP N/A 02/18/2016   Procedure: ENDOSCOPIC RETROGRADE CHOLANGIOPANCREATOGRAPHY (ERCP);  Surgeon: Oliva Boots, MD;  Location: [facility] ENDOSCOPY;  Service: Endoscopy;  Laterality: N/A;   ESOPHAGOGASTRODUODENOSCOPY  07/2007   Rourk: small hh, atrophic gastric mucosa, poor distensibility of stomach ?extrinsic compression, CT showed mild to moderate splenomegaly. No etiology for IDA.   ICD GENERATOR CHANGEOUT N/A 02/16/2024   Procedure: ICD GENERATOR CHANGEOUT;  Surgeon: Waddell Danelle ORN, MD;  Location: [facility] INVASIVE CV LAB;  Service: Cardiovascular;  Laterality: N/A;   JOINT REPLACEMENT  1990's   L knee   LAPAROSCOPIC APPENDECTOMY N/A 09/14/2017   Procedure: APPENDECTOMY LAPAROSCOPIC;  Surgeon: Kimble Agent, MD;  Location: [facility] OR;  Service: General;  Laterality: N/A;   Percutaneous coronary intervention  01/10/2005   Wolm Nida, MD   TONSILLECTOMY     TOTAL KNEE ARTHROPLASTY Right 08/16/2013   Procedure: RIGHT TOTAL KNEE ARTHROPLASTY;  Surgeon: Lonni CINDERELLA Poli, MD;  Location: [facility] OR;  Service: Orthopedics;  Laterality: Right;     Family History  Problem Relation Age of Onset   Heart attack Mother        CVA, MI   Diabetes Mother     Heart attack Father        MI   Diabetes Sister    Coronary artery disease Brother        CABG     Social History   Socioeconomic History   Marital status: Widowed    Spouse name: Not on file   Number of children: Not on file   Years of education: Not on file   Highest education level: Not on file  Occupational History   Occupation: Retired  Employer: UNEMPLOYED  Tobacco Use   Smoking status: Former    Current packs/day: 0.00    Average packs/day: 1 pack/day for 15.0 years (15.0 ttl pk-yrs)    Types: Cigarettes    Start date: 09/30/1975    Quit date: 09/29/1990    Years since quitting: 33.7   Smokeless tobacco: Former    Quit date: 08/09/1988  Vaping Use   Vaping status: Never Used  Substance and Sexual Activity   Alcohol use: No   Drug use: No   Sexual activity: Not Currently  Other Topics Concern   Not on file  Social History Narrative   ** Merged History Encounter ** [place] wifeBeen on disability since his stroke in the 1990sNot routinely exercising      Lives alone, daughters close and stay there sometimes   Right handed (weakened from stroke)   Cannot hear out of R ear   Caffeine: none for last month   Social Drivers of Corporate investment banker Strain: Not on file  Food Insecurity: Not on file  Transportation Needs: Not on file  Physical Activity: Not on file  Stress: Not on file  Social Connections: Not on file  Intimate Partner Violence: Not on file     BP (!) 152/70 (BP Location: Left Arm, Patient Position: Sitting, Cuff Size: Large)   Pulse 64   Ht 6' 2 (1.88 m)   Wt 207 lb (93.9 kg)   SpO2 97%   BMI 26.58 kg/m   Physical Exam:  Well appearing NAD HEENT: Unremarkable Neck:  No JVD, no thyromegally Lymphatics:  No adenopathy Back:  No CVA tenderness Lungs:  Clear with no wheezes; well healed right chest incision HEART:  Regular rate rhythm, no murmurs, no rubs, no clicks Abd:  soft, positive bowel sounds, no  organomegally, no rebound, no guarding Ext:  2 plus pulses, no edema, no cyanosis, no clubbing Skin:  No rashes no nodules Neuro:  CN II through XII intact, motor grossly intact  DEVICE  Normal device function.  See PaceArt for details. Outputs are fixed.  Assess/Plan: VT - he has had no therapies since his gen change out. Sinus node dysfunction - he is pacing in the atrium 84%. ICD - his DDD ICD is working normally.

## 2024-06-22 NOTE — Progress Notes (Signed)
Remote ICD Transmission.

## 2024-06-29 ENCOUNTER — Inpatient Hospital Stay

## 2024-07-20 ENCOUNTER — Telehealth: Payer: Self-pay | Admitting: Internal Medicine

## 2024-07-20 NOTE — Telephone Encounter (Signed)
 Attempt to return call to patient as requested. Unable to reach patient and voicemail left on number listed.   Will continue to monitor & update accordingly.

## 2024-07-20 NOTE — Telephone Encounter (Signed)
 Pt states he got a message saying the device is not hooked up correctly. Pt would like a c/b please advise

## 2024-07-21 NOTE — Telephone Encounter (Signed)
Attempted to call patient back . No answer. LMTCB.

## 2024-07-21 NOTE — Telephone Encounter (Addendum)
 Patient called back to clinic   Patient does not recall who exactly called to tell him that something was wrong with his device but that he believes it was related to his remote transmissions  This RN looked up the patient's remote transmission schedule on the [employer] website, and also on [employer] in the patient's future appointments list and they match perfectly with each other  This RN then checked in Paceart for the patient's last received scheduled transmission which was on 05/20/24 which lines up with the 91 day schedule  Patient confirmed that his phone monitoring device is fully charged and operating ok  [employer] website confirms connection with patient's device as of today 07/21/24  This RN informed the patient that everything appears to be in order with his transmission schedules and devices and is unsure who it was that called and told him this information because there is no documentation proof of it anywhere.  Patient appreciative for the questions answered

## 2024-07-29 ENCOUNTER — Ambulatory Visit: Admitting: Internal Medicine

## 2024-07-29 ENCOUNTER — Ambulatory Visit: Admitting: Podiatry

## 2024-07-29 NOTE — Telephone Encounter (Signed)
 Patient is following up--again he is saying that someone called him saying his ICD transmission wasn't received. He acknowledged that someone contacted him to let him know that he was connected again and there weren't any issues, but he would like to make sure. Please advise.

## 2024-07-29 NOTE — Telephone Encounter (Addendum)
 Attempted outreach to patient. No answer. LMTCB.  Not sure who exactly is contacting this patient for a missed transmission when his next scheduled one isn't until 08/17/24   Will attempt to get number that called the patient to then call and see who it is.

## 2024-08-01 ENCOUNTER — Ambulatory Visit (INDEPENDENT_AMBULATORY_CARE_PROVIDER_SITE_OTHER): Admitting: Podiatry

## 2024-08-01 ENCOUNTER — Encounter: Payer: Self-pay | Admitting: Podiatry

## 2024-08-01 DIAGNOSIS — E1351 Other specified diabetes mellitus with diabetic peripheral angiopathy without gangrene: Secondary | ICD-10-CM

## 2024-08-01 DIAGNOSIS — B351 Tinea unguium: Secondary | ICD-10-CM

## 2024-08-01 DIAGNOSIS — I739 Peripheral vascular disease, unspecified: Secondary | ICD-10-CM

## 2024-08-01 NOTE — Progress Notes (Addendum)
This patient returns to my office for at risk foot care.  This patient requires this care by a professional since this patient will be at risk due to having diabetes PVD and thrombocytopenia.  patient is unable to cut nails himself since the patient cannot reach his nails.These nails are painful walking and wearing shoes.  This patient presents for at risk foot care today.  General Appearance  Alert, conversant and in no acute stress.  Vascular  Dorsalis pedis and posterior tibial  pulses are  weakly palpable  bilaterally.  Capillary return is within normal limits  bilaterally. Cold feet. bilaterally.  Neurologic  Senn-Weinstein monofilament wire test within normal limits  bilaterally. Muscle power within normal limits bilaterally.  Nails Thick disfigured discolored nails with subungual debris  from hallux to fifth toes bilaterally. No evidence of bacterial infection or drainage bilaterally.  Orthopedic  No limitations of motion  feet .  No crepitus or effusions noted.  No bony pathology or digital deformities noted.  Skin  normotropic skin with no porokeratosis noted bilaterally.  No signs of infections or ulcers noted.     Onychomycosis  Pain in right toes  Pain in left toes  Consent was obtained for treatment procedures.   Mechanical debridement of nails 1-5  bilaterally performed with a nail nipper.  Filed with dremel without incident.    Return office visit   3 months                   Told patient to return for periodic foot care and evaluation due to potential at risk complications.   Joah Patlan DPM   

## 2024-08-02 NOTE — Telephone Encounter (Signed)
 Returned call to pt.  Advised monitor connected.

## 2024-08-03 ENCOUNTER — Inpatient Hospital Stay

## 2024-08-03 ENCOUNTER — Inpatient Hospital Stay: Attending: Hematology

## 2024-08-03 DIAGNOSIS — E538 Deficiency of other specified B group vitamins: Secondary | ICD-10-CM | POA: Diagnosis not present

## 2024-08-03 DIAGNOSIS — E1140 Type 2 diabetes mellitus with diabetic neuropathy, unspecified: Secondary | ICD-10-CM | POA: Diagnosis not present

## 2024-08-03 DIAGNOSIS — D509 Iron deficiency anemia, unspecified: Secondary | ICD-10-CM

## 2024-08-03 MED ORDER — CYANOCOBALAMIN 1000 MCG/ML IJ SOLN
1000.0000 ug | Freq: Once | INTRAMUSCULAR | Status: AC
  Administered 2024-08-03: 1000 ug via SUBCUTANEOUS
  Filled 2024-08-03: qty 1

## 2024-08-11 ENCOUNTER — Ambulatory Visit: Admitting: Neurology

## 2024-08-17 ENCOUNTER — Ambulatory Visit

## 2024-08-17 DIAGNOSIS — I472 Ventricular tachycardia, unspecified: Secondary | ICD-10-CM | POA: Diagnosis not present

## 2024-08-17 DIAGNOSIS — T148XXA Other injury of unspecified body region, initial encounter: Secondary | ICD-10-CM | POA: Diagnosis not present

## 2024-08-17 DIAGNOSIS — X58XXXA Exposure to other specified factors, initial encounter: Secondary | ICD-10-CM | POA: Diagnosis not present

## 2024-08-17 DIAGNOSIS — S81801A Unspecified open wound, right lower leg, initial encounter: Secondary | ICD-10-CM | POA: Diagnosis not present

## 2024-08-18 ENCOUNTER — Ambulatory Visit: Payer: Self-pay | Admitting: Internal Medicine

## 2024-08-18 LAB — CUP PACEART REMOTE DEVICE CHECK
Battery Remaining Longevity: 84 mo
Battery Remaining Percentage: 89 %
Battery Voltage: 3.01 V
Brady Statistic AP VP Percent: 29 %
Brady Statistic AP VS Percent: 50 %
Brady Statistic AS VP Percent: 1 %
Brady Statistic AS VS Percent: 20 %
Brady Statistic RA Percent Paced: 79 %
Brady Statistic RV Percent Paced: 29 %
Date Time Interrogation Session: 20251119020119
HighPow Impedance: 50 Ohm
Implantable Lead Connection Status: 753985
Implantable Lead Connection Status: 753985
Implantable Lead Implant Date: 20090708
Implantable Lead Implant Date: 20090708
Implantable Lead Location: 753859
Implantable Lead Location: 753860
Implantable Lead Model: 7122
Implantable Pulse Generator Implant Date: 20250520
Lead Channel Impedance Value: 290 Ohm
Lead Channel Impedance Value: 340 Ohm
Lead Channel Pacing Threshold Amplitude: 0.5 V
Lead Channel Pacing Threshold Amplitude: 1.25 V
Lead Channel Pacing Threshold Pulse Width: 0.5 ms
Lead Channel Pacing Threshold Pulse Width: 0.8 ms
Lead Channel Sensing Intrinsic Amplitude: 1.3 mV
Lead Channel Sensing Intrinsic Amplitude: 7.3 mV
Lead Channel Setting Pacing Amplitude: 2 V
Lead Channel Setting Pacing Amplitude: 2.5 V
Lead Channel Setting Pacing Pulse Width: 0.8 ms
Lead Channel Setting Sensing Sensitivity: 0.5 mV
Pulse Gen Serial Number: 111071983

## 2024-08-19 NOTE — Progress Notes (Signed)
 Remote ICD Transmission

## 2024-08-31 ENCOUNTER — Inpatient Hospital Stay: Attending: Hematology

## 2024-08-31 ENCOUNTER — Inpatient Hospital Stay

## 2024-08-31 VITALS — BP 152/67 | HR 64 | Temp 97.8°F | Resp 18

## 2024-08-31 DIAGNOSIS — E538 Deficiency of other specified B group vitamins: Secondary | ICD-10-CM | POA: Diagnosis present

## 2024-08-31 DIAGNOSIS — D509 Iron deficiency anemia, unspecified: Secondary | ICD-10-CM

## 2024-08-31 MED ORDER — CYANOCOBALAMIN 1000 MCG/ML IJ SOLN
1000.0000 ug | Freq: Once | INTRAMUSCULAR | Status: AC
  Administered 2024-08-31: 1000 ug via SUBCUTANEOUS
  Filled 2024-08-31: qty 1

## 2024-10-05 ENCOUNTER — Inpatient Hospital Stay

## 2024-10-31 ENCOUNTER — Ambulatory Visit: Admitting: Podiatry

## 2024-11-02 ENCOUNTER — Inpatient Hospital Stay: Attending: Hematology

## 2024-11-02 ENCOUNTER — Other Ambulatory Visit: Payer: Self-pay | Admitting: Internal Medicine

## 2024-11-02 ENCOUNTER — Inpatient Hospital Stay

## 2024-11-02 ENCOUNTER — Inpatient Hospital Stay: Admitting: Hematology

## 2024-11-02 DIAGNOSIS — I480 Paroxysmal atrial fibrillation: Secondary | ICD-10-CM

## 2024-11-03 NOTE — Telephone Encounter (Signed)
 Prescription refill request for Xarelto  received.  Indication: AFIB Last office visit: 05/2024 Weight: 207LB 93.89 KG Age: 79 Scr: 1.31 CrCl: 63

## 2024-11-16 ENCOUNTER — Encounter

## 2024-11-16 ENCOUNTER — Ambulatory Visit: Admitting: Podiatry

## 2024-12-19 ENCOUNTER — Inpatient Hospital Stay: Attending: Hematology

## 2024-12-19 ENCOUNTER — Inpatient Hospital Stay

## 2024-12-19 ENCOUNTER — Inpatient Hospital Stay: Admitting: Hematology

## 2025-02-15 ENCOUNTER — Encounter

## 2025-05-17 ENCOUNTER — Encounter

## 2025-08-16 ENCOUNTER — Encounter

## 2025-11-15 ENCOUNTER — Encounter

## 2026-02-14 ENCOUNTER — Encounter
